# Patient Record
Sex: Female | Born: 1975 | Race: Black or African American | Hispanic: No | Marital: Single | Smoking: Former smoker
Health system: Southern US, Community
[De-identification: ages and names within clinical notes are randomized; demographics above are authoritative.]

## PROBLEM LIST (undated history)

## (undated) DIAGNOSIS — F419 Anxiety disorder, unspecified: Secondary | ICD-10-CM

## (undated) DIAGNOSIS — R0602 Shortness of breath: Secondary | ICD-10-CM

## (undated) DIAGNOSIS — R011 Cardiac murmur, unspecified: Secondary | ICD-10-CM

## (undated) DIAGNOSIS — J383 Other diseases of vocal cords: Secondary | ICD-10-CM

## (undated) DIAGNOSIS — I10 Essential (primary) hypertension: Secondary | ICD-10-CM

## (undated) DIAGNOSIS — F1120 Opioid dependence, uncomplicated: Secondary | ICD-10-CM

## (undated) DIAGNOSIS — J45909 Unspecified asthma, uncomplicated: Secondary | ICD-10-CM

## (undated) DIAGNOSIS — J40 Bronchitis, not specified as acute or chronic: Secondary | ICD-10-CM

## (undated) HISTORY — PX: TRACHEOSTOMY CLOSURE: SHX458

---

## 2012-11-13 HISTORY — PX: SPINAL FUSION: SHX223

## 2013-03-29 ENCOUNTER — Encounter (HOSPITAL_COMMUNITY): Payer: Self-pay | Admitting: *Deleted

## 2013-03-29 ENCOUNTER — Emergency Department (HOSPITAL_COMMUNITY)
Admission: EM | Admit: 2013-03-29 | Discharge: 2013-03-29 | Disposition: A | Discharge status: Home / Self Care | Payer: Medicaid Other | Attending: Emergency Medicine | Admitting: Emergency Medicine

## 2013-03-29 ENCOUNTER — Emergency Department (HOSPITAL_COMMUNITY): Payer: Medicaid Other

## 2013-03-29 DIAGNOSIS — S0993XA Unspecified injury of face, initial encounter: Secondary | ICD-10-CM | POA: Insufficient documentation

## 2013-03-29 DIAGNOSIS — J45909 Unspecified asthma, uncomplicated: Secondary | ICD-10-CM | POA: Insufficient documentation

## 2013-03-29 DIAGNOSIS — IMO0002 Reserved for concepts with insufficient information to code with codable children: Secondary | ICD-10-CM | POA: Insufficient documentation

## 2013-03-29 DIAGNOSIS — F17200 Nicotine dependence, unspecified, uncomplicated: Secondary | ICD-10-CM | POA: Insufficient documentation

## 2013-03-29 HISTORY — DX: Unspecified asthma, uncomplicated: J45.909

## 2013-03-29 MED ORDER — CYCLOBENZAPRINE HCL 10 MG PO TABS
10.0000 mg | ORAL_TABLET | Freq: Once | ORAL | Status: AC
  Administered 2013-03-29: 10 mg via ORAL
  Filled 2013-03-29: qty 1

## 2013-03-29 MED ORDER — HYDROCODONE-ACETAMINOPHEN 7.5-325 MG/15ML PO SOLN: 15.0000 mL | Freq: Three times a day (TID) | ORAL | Status: DC | PRN

## 2013-03-29 MED ORDER — HYDROCODONE-ACETAMINOPHEN 5-325 MG PO TABS
2.0000 | ORAL_TABLET | Freq: Once | ORAL | Status: AC
  Administered 2013-03-29: 2 via ORAL
  Filled 2013-03-29: qty 2

## 2013-03-29 MED ORDER — CYCLOBENZAPRINE HCL 10 MG PO TABS: 10.0000 mg | ORAL_TABLET | Freq: Two times a day (BID) | ORAL | Status: DC | PRN

## 2013-03-29 NOTE — ED Notes (Signed)
Patient transported to X-ray 
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
## 2013-03-29 NOTE — ED Notes (Signed)
She reports that she has loose teeth no mouth bleeding
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
## 2013-03-29 NOTE — ED Notes (Signed)
2 visitors escorted back to be with pt

## 2013-03-30 NOTE — ED Provider Notes (Signed)
Medical screening examination/treatment/procedure(s) were performed by non-physician practitioner and as supervising physician I was immediately available for consultation/collaboration.  Karilynn Carranza F Javid Kemler, MD 03/30/13 0807 

## 2013-04-07 ENCOUNTER — Emergency Department (HOSPITAL_COMMUNITY): Payer: No Typology Code available for payment source

## 2013-04-07 ENCOUNTER — Encounter (HOSPITAL_COMMUNITY): Payer: Self-pay | Admitting: Emergency Medicine

## 2013-04-07 ENCOUNTER — Emergency Department (HOSPITAL_COMMUNITY)
Admission: EM | Admit: 2013-04-07 | Discharge: 2013-04-07 | Disposition: A | Discharge status: Home / Self Care | Payer: No Typology Code available for payment source | Attending: Emergency Medicine | Admitting: Emergency Medicine

## 2013-04-07 DIAGNOSIS — S0993XA Unspecified injury of face, initial encounter: Secondary | ICD-10-CM | POA: Insufficient documentation

## 2013-04-07 DIAGNOSIS — J45909 Unspecified asthma, uncomplicated: Secondary | ICD-10-CM | POA: Insufficient documentation

## 2013-04-07 DIAGNOSIS — S8990XA Unspecified injury of unspecified lower leg, initial encounter: Secondary | ICD-10-CM | POA: Insufficient documentation

## 2013-04-07 DIAGNOSIS — M545 Low back pain: Secondary | ICD-10-CM

## 2013-04-07 DIAGNOSIS — Z79899 Other long term (current) drug therapy: Secondary | ICD-10-CM | POA: Insufficient documentation

## 2013-04-07 DIAGNOSIS — Y92410 Unspecified street and highway as the place of occurrence of the external cause: Secondary | ICD-10-CM | POA: Insufficient documentation

## 2013-04-07 DIAGNOSIS — M542 Cervicalgia: Secondary | ICD-10-CM

## 2013-04-07 DIAGNOSIS — Y9389 Activity, other specified: Secondary | ICD-10-CM | POA: Insufficient documentation

## 2013-04-07 DIAGNOSIS — F17200 Nicotine dependence, unspecified, uncomplicated: Secondary | ICD-10-CM | POA: Insufficient documentation

## 2013-04-07 DIAGNOSIS — IMO0002 Reserved for concepts with insufficient information to code with codable children: Secondary | ICD-10-CM | POA: Insufficient documentation

## 2013-04-07 LAB — BASIC METABOLIC PANEL
BUN: 8 mg/dL (ref 6–23)
Creatinine, Ser: 0.75 mg/dL (ref 0.50–1.10)
GFR calc Af Amer: >90 mL/min (ref >90)
GFR calc non Af Amer: >90 mL/min (ref >90)
Glucose, Bld: 102 mg/dL — ABNORMAL HIGH (ref 70–99)
Potassium: 3.3 mEq/L — ABNORMAL LOW (ref 3.5–5.1)

## 2013-04-07 LAB — CBC WITH DIFFERENTIAL/PLATELET
Basophils Relative: 1 % (ref 0–1)
Eosinophils Absolute: 0.2 10*3/uL (ref 0.0–0.7)
Eosinophils Relative: 3 % (ref 0–5)
HCT: 31.3 % — ABNORMAL LOW (ref 36.0–46.0)
Hemoglobin: 10.2 g/dL — ABNORMAL LOW (ref 12.0–15.0)
Lymphs Abs: 2.3 10*3/uL (ref 0.7–4.0)
MCH: 24.7 pg — ABNORMAL LOW (ref 26.0–34.0)
MCHC: 32.6 g/dL (ref 30.0–36.0)
MCV: 75.8 fL — ABNORMAL LOW (ref 78.0–100.0)
Monocytes Absolute: 0.3 10*3/uL (ref 0.1–1.0)
Monocytes Relative: 4 % (ref 3–12)
RBC: 4.13 MIL/uL (ref 3.87–5.11)

## 2013-04-07 MED ORDER — CYCLOBENZAPRINE HCL 10 MG PO TABS
5.0000 mg | ORAL_TABLET | Freq: Once | ORAL | Status: AC
  Administered 2013-04-07: 5 mg via ORAL
  Filled 2013-04-07: qty 1

## 2013-04-07 MED ORDER — ORPHENADRINE CITRATE 30 MG/ML IJ SOLN: 60.0000 mg | Freq: Two times a day (BID) | INTRAMUSCULAR | Status: DC

## 2013-04-07 MED ORDER — MORPHINE SULFATE 4 MG/ML IJ SOLN
4.0000 mg | Freq: Once | INTRAMUSCULAR | Status: AC
  Administered 2013-04-07: 4 mg via INTRAVENOUS
  Filled 2013-04-07: qty 1

## 2013-04-07 MED ORDER — HYDROCODONE-ACETAMINOPHEN 5-325 MG PO TABS: 1.0000 | ORAL_TABLET | Freq: Four times a day (QID) | ORAL | Status: DC | PRN

## 2013-04-07 MED ORDER — HYDROMORPHONE HCL PF 1 MG/ML IJ SOLN
1.0000 mg | Freq: Once | INTRAMUSCULAR | Status: AC
  Administered 2013-04-07: 1 mg via INTRAVENOUS
  Filled 2013-04-07: qty 1
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
## 2013-04-07 NOTE — ED Notes (Signed)
Patient returned from X-ray 
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
## 2013-04-07 NOTE — ED Notes (Signed)
Patient transported to X-ray 

## 2013-05-04 ENCOUNTER — Emergency Department (HOSPITAL_COMMUNITY)
Admission: EM | Admit: 2013-05-04 | Discharge: 2013-05-05 | Disposition: A | Discharge status: Home / Self Care | Payer: Medicaid Other | Attending: Emergency Medicine | Admitting: Emergency Medicine

## 2013-05-04 ENCOUNTER — Emergency Department (HOSPITAL_COMMUNITY): Payer: Medicaid Other

## 2013-05-04 ENCOUNTER — Encounter (HOSPITAL_COMMUNITY): Payer: Self-pay | Admitting: Emergency Medicine

## 2013-05-04 DIAGNOSIS — J4489 Other specified chronic obstructive pulmonary disease: Secondary | ICD-10-CM | POA: Insufficient documentation

## 2013-05-04 DIAGNOSIS — F3289 Other specified depressive episodes: Secondary | ICD-10-CM | POA: Insufficient documentation

## 2013-05-04 DIAGNOSIS — R05 Cough: Secondary | ICD-10-CM | POA: Insufficient documentation

## 2013-05-04 DIAGNOSIS — J449 Chronic obstructive pulmonary disease, unspecified: Secondary | ICD-10-CM | POA: Insufficient documentation

## 2013-05-04 DIAGNOSIS — J45909 Unspecified asthma, uncomplicated: Secondary | ICD-10-CM | POA: Insufficient documentation

## 2013-05-04 DIAGNOSIS — M549 Dorsalgia, unspecified: Secondary | ICD-10-CM | POA: Insufficient documentation

## 2013-05-04 DIAGNOSIS — R0602 Shortness of breath: Secondary | ICD-10-CM | POA: Insufficient documentation

## 2013-05-04 DIAGNOSIS — Z79899 Other long term (current) drug therapy: Secondary | ICD-10-CM | POA: Insufficient documentation

## 2013-05-04 DIAGNOSIS — R059 Cough, unspecified: Secondary | ICD-10-CM | POA: Insufficient documentation

## 2013-05-04 DIAGNOSIS — W19XXXA Unspecified fall, initial encounter: Secondary | ICD-10-CM

## 2013-05-04 DIAGNOSIS — Z3202 Encounter for pregnancy test, result negative: Secondary | ICD-10-CM | POA: Insufficient documentation

## 2013-05-04 DIAGNOSIS — F329 Major depressive disorder, single episode, unspecified: Secondary | ICD-10-CM | POA: Insufficient documentation

## 2013-05-04 DIAGNOSIS — F411 Generalized anxiety disorder: Secondary | ICD-10-CM | POA: Insufficient documentation

## 2013-05-04 DIAGNOSIS — R55 Syncope and collapse: Secondary | ICD-10-CM | POA: Insufficient documentation

## 2013-05-04 DIAGNOSIS — R079 Chest pain, unspecified: Secondary | ICD-10-CM | POA: Insufficient documentation

## 2013-05-04 HISTORY — DX: Anxiety disorder, unspecified: F41.9

## 2013-05-04 LAB — BASIC METABOLIC PANEL WITH GFR
BUN: 10 mg/dL (ref 6–23)
CO2: 26 meq/L (ref 19–32)
Calcium: 9.3 mg/dL (ref 8.4–10.5)
Chloride: 104 meq/L (ref 96–112)
Creatinine, Ser: 0.75 mg/dL (ref 0.50–1.10)
GFR calc Af Amer: >90 mL/min
GFR calc non Af Amer: >90 mL/min
Glucose, Bld: 83 mg/dL (ref 70–99)
Potassium: 3.7 meq/L (ref 3.5–5.1)
Sodium: 140 meq/L (ref 135–145)

## 2013-05-04 LAB — POCT I-STAT TROPONIN I: Troponin i, poc: 0 ng/mL (ref 0.00–0.08)

## 2013-05-04 LAB — CBC
Hemoglobin: 11.1 g/dL — ABNORMAL LOW (ref 12.0–15.0)
MCH: 25.2 pg — ABNORMAL LOW (ref 26.0–34.0)
MCHC: 33.2 g/dL (ref 30.0–36.0)
RDW: 16.7 % — ABNORMAL HIGH (ref 11.5–15.5)

## 2013-05-04 MED ORDER — HYDROMORPHONE HCL PF 1 MG/ML IJ SOLN
1.0000 mg | Freq: Once | INTRAMUSCULAR | Status: AC
  Administered 2013-05-04: 1 mg via INTRAVENOUS
  Filled 2013-05-04: qty 1

## 2013-05-04 MED ORDER — FENTANYL CITRATE 0.05 MG/ML IJ SOLN
50.0000 ug | Freq: Once | INTRAMUSCULAR | Status: AC
  Administered 2013-05-04: 50 ug via INTRAVENOUS
  Filled 2013-05-04: qty 2

## 2013-05-04 NOTE — ED Notes (Signed)
Patient requests more pain medication as her pain level is a 9 out of 10.

## 2013-05-04 NOTE — ED Notes (Signed)
MD at bedside. 
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
## 2013-05-04 NOTE — ED Provider Notes (Signed)
CSN: [id number]     Arrival date & time 05/04/13  2035 History   First MD Initiated Contact with Patient 05/04/13 2104     Chief Complaint  Patient presents with  . Chest Pain  . Back Pain  . Loss of Consciousness   (Consider location/radiation/quality/duration/timing/severity/associated sxs/prior Treatment) Patient is a 37 y.o. female presenting with chest pain, back pain, and syncope.  Chest Pain Associated symptoms: back pain and syncope   Back Pain Associated symptoms: chest pain   Loss of Consciousness Associated symptoms: chest pain    Pt with history of asthma reports she has had SOB, wheezing and chest tightness all day today with occasional cough but no fever. Just prior to arrival she went to the bathroom and apparently passed out. She does not remember what happened. Daughter called EMS but she is not available here. EMS reports there was apparently some shaking but no definite seizure. She was alert en route. Complaining now of severe thoracic and lower back pain. Recently moved to this area from [place] where she reportedly had lumbar spine fusion in April.   Past Medical History  Diagnosis Date  . Asthma   . COPD (chronic obstructive pulmonary disease)   . Anxiety    Past Surgical History  Procedure Laterality Date  . Spinal fusion     No family history on file. History  Substance Use Topics  . Smoking status: Current Every Day Smoker  . Smokeless tobacco: Not on file  . Alcohol Use: No   OB History   Grav Para Term Preterm Abortions TAB SAB Ect Mult Living                 Review of Systems  Cardiovascular: Positive for chest pain and syncope.  Musculoskeletal: Positive for back pain.   All other systems reviewed and are negative except as noted in HPI.   Allergies  Nsaids and Tramadol  Home Medications   Current Outpatient Rx  Name  Route  Sig  Dispense  Refill  . albuterol (PROVENTIL HFA;VENTOLIN HFA) 108 (90 BASE) MCG/ACT inhaler    Inhalation   Inhale 2 puffs into the lungs every 6 (six) hours as needed for wheezing.          BP 113/76  Pulse 85  Temp(Src) 98.6 F (37 C) (Oral)  Resp 18  Ht 5\' 2"  (1.575 m)  Wt 186 lb (84.369 kg)  BMI 34.01 kg/m2  SpO2 100% Physical Exam  Nursing note and vitals reviewed. Constitutional: She is oriented to person, place, and time. She appears well-developed and well-nourished.  HENT:  Head: Normocephalic and atraumatic.  Eyes: EOM are normal. Pupils are equal, round, and reactive to light.  Neck: Normal range of motion. Neck supple.  C-collar removed, NEXUS criteria met  Cardiovascular: Normal rate, normal heart sounds and intact distal pulses.   Pulmonary/Chest: Effort normal and breath sounds normal.  Abdominal: Bowel sounds are normal. She exhibits no distension. There is no tenderness.  Musculoskeletal: Normal range of motion. She exhibits tenderness (thoracic and low back pain). She exhibits no edema.  Neurological: She is alert and oriented to person, place, and time. She has normal strength. No cranial nerve deficit or sensory deficit.  Skin: Skin is warm and dry. No rash noted.  Psychiatric: She has a normal mood and affect.    ED Course  Procedures (including critical care time) Labs Review Labs Reviewed  CBC - Abnormal; Notable for the following:    WBC 11.2 (*)  Hemoglobin 11.1 (*)    HCT 33.4 (*)    MCV 75.7 (*)    MCH 25.2 (*)    RDW 16.7 (*)    All other components within normal limits  BASIC METABOLIC PANEL  URINALYSIS, ROUTINE W REFLEX MICROSCOPIC  PREGNANCY, URINE  POCT I-STAT TROPONIN I   Imaging Review No results found.  MDM  No diagnosis found.  ECG interpretation   Date: 05/04/2013  Rate: 82  Rhythm: normal sinus rhythm  QRS Axis: normal  Intervals: normal  ST/T Wave abnormalities: normal  Conduction Disutrbances: none  Narrative Interpretation: [facility]  Old EKG Reviewed: None available   Care signed out at the change of  shift pending imaging and labs.     Charles B. Bernette Mayers, MD 05/04/13 2248

## 2013-05-05 LAB — URINALYSIS, ROUTINE W REFLEX MICROSCOPIC
Glucose, UA: NEGATIVE mg/dL
Ketones, ur: 40 mg/dL — AB
Leukocytes, UA: NEGATIVE
Protein, ur: NEGATIVE mg/dL
Urobilinogen, UA: 0.2 mg/dL (ref 0.0–1.0)

## 2013-05-05 LAB — POCT I-STAT TROPONIN I

## 2013-05-05 MED ORDER — HYDROMORPHONE HCL PF 1 MG/ML IJ SOLN
1.0000 mg | Freq: Once | INTRAMUSCULAR | Status: AC
  Administered 2013-05-05: 1 mg via INTRAVENOUS
  Filled 2013-05-05: qty 1

## 2013-05-05 NOTE — ED Provider Notes (Signed)
Pt can ambulate, but does have continued low back pain She has no focal neuro deficits Repeat troponin negative I doubt PE at this time She reports her CP similar to prior asthma exacerbations Feel she is safe for d/c home   Joya Gaskins, MD 05/05/13 [phone]

## 2013-05-05 NOTE — ED Notes (Signed)
Signature pad not working in room 

## 2013-05-05 NOTE — ED Provider Notes (Signed)
Date: 05/05/2013 00:02  Rate: 75  Rhythm: normal sinus rhythm  QRS Axis: normal  Intervals: normal  ST/T Wave abnormalities: nonspecific ST changes  Conduction Disutrbances:none  Narrative Interpretation: [facility] noted  Old EKG Reviewed: unchanged from earlier EKG    Joya Gaskins, MD 05/05/13 [phone]

## 2013-05-05 NOTE — ED Notes (Signed)
Pt given [place] sandwich and sprite per MD approval.

## 2013-05-07 ENCOUNTER — Emergency Department (INDEPENDENT_AMBULATORY_CARE_PROVIDER_SITE_OTHER)
Admission: EM | Admit: 2013-05-07 | Discharge: 2013-05-07 | Disposition: A | Discharge status: Home / Self Care | Payer: Medicaid Other | Source: Home / Self Care | Attending: Family Medicine | Admitting: Family Medicine

## 2013-05-07 ENCOUNTER — Encounter (HOSPITAL_COMMUNITY): Payer: Self-pay | Admitting: Emergency Medicine

## 2013-05-07 DIAGNOSIS — M545 Low back pain: Secondary | ICD-10-CM

## 2013-05-07 NOTE — ED Notes (Signed)
Pt c/o lower back pain due to a recent fall from a panic attack Seen at [facility] ER recently for similar sxs Reports recent spinal infusion She is alert w/no signs of acute distress.

## 2013-05-07 NOTE — ED Provider Notes (Signed)
Julie Kerr is a 37 y.o. female who presents to Urgent Care today for low back pain and anxiety. Patient was recently moved from  out of town. 4 days prior she had a panic attack and fell over injuring her low back. She presented to the emergency room where x-rays were negative. She notes continued low back pain. She has radiation to the posterior thigh. She denies any radiating pain weakness numbness difficulty walking bowel bladder dysfunction. She's tried over-the-counter pain medications which were somewhat helpful. She is allergic to tramadol and NSAIDs.   Additionally she notes anxiety. This is been previously well controlled with Xanax by her primary care Dr. She has run out of her Xanax would like a refill today of possible. She notes that she has had prior suicide attempts on SSRIs. She does not feel suicidal or homicidal currently. She feels well otherwise  Past Medical History  Diagnosis Date  . Asthma   . COPD (chronic obstructive pulmonary disease)   . Anxiety    History  Substance Use Topics  . Smoking status: Current Every Day Smoker  . Smokeless tobacco: Not on file  . Alcohol Use: No   ROS as above Medications reviewed. No current facility-administered medications for this encounter.   Current Outpatient Prescriptions  Medication Sig Dispense Refill  . albuterol (PROVENTIL HFA;VENTOLIN HFA) 108 (90 BASE) MCG/ACT inhaler Inhale 2 puffs into the lungs every 6 (six) hours as needed for wheezing.        Exam:  BP 110/77  Pulse 80  Temp(Src) 98 F (36.7 C) (Oral)  Resp 16  SpO2 98%  LMP 04/27/2013 Gen: Well NAD HEENT: EOMI,  MMM Lungs: CTABL Nl WOB Heart: RRR no MRG Abd: NABS, NT, ND Exts: Non edematous BL  LE, warm and well perfused.  Back: Nontender to spinal midline tender palpation bilateral lumbar paraspinals  decreased flexion and extension neck range of motion due to pain. Normal rotation Reflexes are equal bilateral ankles and knees Strength is intact  lower extremity. Patient can get on and off exam table squat and has a normal gait.    No results found for this or any previous visit (from the past 24 hour(s)). No results found.  Assessment and Plan: 37 y.o. female with low back pain.  Unclear etiology likely mechanical muscular.  Plan only able to prescribe narcotic pain medications as patient has had multiple refills of multiple different providers over the past 2 months. Most recently she was prescribed 90 tablets of 15 mg oxycodone  on September 9. I discussed that I cannot prescribe medications and that she should take her left upper oxycodone if possible. She can followup with the on-call orthopedic doctor Dr. Luiz Blare or establish with a primary care Dr. in town..  For exam it again I cannot prescribe Xanax. I have referred her to the [facility] in [place] where she can get mental health. She is nonsuicidal however suicidal precautions were reviewed.  Discussed warning signs or symptoms. Please see discharge instructions. Patient expresses understanding.      Rodolph Bong, MD 05/07/13 [phone]

## 2013-06-14 ENCOUNTER — Inpatient Hospital Stay (HOSPITAL_COMMUNITY)
Admission: EM | Admit: 2013-06-14 | Discharge: 2013-06-18 | DRG: 208 | Disposition: A | Discharge status: Home Health | Payer: Medicaid Other | Attending: Internal Medicine | Admitting: Internal Medicine

## 2013-06-14 ENCOUNTER — Emergency Department (HOSPITAL_COMMUNITY): Payer: Medicaid Other

## 2013-06-14 ENCOUNTER — Encounter (HOSPITAL_COMMUNITY): Payer: Self-pay | Admitting: Pulmonary Disease

## 2013-06-14 DIAGNOSIS — J9600 Acute respiratory failure, unspecified whether with hypoxia or hypercapnia: Secondary | ICD-10-CM | POA: Diagnosis present

## 2013-06-14 DIAGNOSIS — G9340 Encephalopathy, unspecified: Secondary | ICD-10-CM | POA: Diagnosis present

## 2013-06-14 DIAGNOSIS — J9601 Acute respiratory failure with hypoxia: Secondary | ICD-10-CM | POA: Diagnosis present

## 2013-06-14 DIAGNOSIS — I10 Essential (primary) hypertension: Secondary | ICD-10-CM | POA: Diagnosis present

## 2013-06-14 DIAGNOSIS — F17200 Nicotine dependence, unspecified, uncomplicated: Secondary | ICD-10-CM | POA: Diagnosis present

## 2013-06-14 DIAGNOSIS — Z981 Arthrodesis status: Secondary | ICD-10-CM

## 2013-06-14 DIAGNOSIS — Z79899 Other long term (current) drug therapy: Secondary | ICD-10-CM

## 2013-06-14 DIAGNOSIS — S0003XA Contusion of scalp, initial encounter: Secondary | ICD-10-CM | POA: Diagnosis not present

## 2013-06-14 DIAGNOSIS — W010XXA Fall on same level from slipping, tripping and stumbling without subsequent striking against object, initial encounter: Secondary | ICD-10-CM | POA: Diagnosis not present

## 2013-06-14 DIAGNOSIS — F411 Generalized anxiety disorder: Secondary | ICD-10-CM | POA: Diagnosis present

## 2013-06-14 DIAGNOSIS — G894 Chronic pain syndrome: Secondary | ICD-10-CM | POA: Diagnosis present

## 2013-06-14 DIAGNOSIS — T380X5A Adverse effect of glucocorticoids and synthetic analogues, initial encounter: Secondary | ICD-10-CM | POA: Diagnosis not present

## 2013-06-14 DIAGNOSIS — Y9210 Unspecified residential institution as the place of occurrence of the external cause: Secondary | ICD-10-CM | POA: Diagnosis not present

## 2013-06-14 DIAGNOSIS — J441 Chronic obstructive pulmonary disease with (acute) exacerbation: Principal | ICD-10-CM | POA: Diagnosis present

## 2013-06-14 DIAGNOSIS — K5900 Constipation, unspecified: Secondary | ICD-10-CM | POA: Diagnosis present

## 2013-06-14 DIAGNOSIS — D509 Iron deficiency anemia, unspecified: Secondary | ICD-10-CM | POA: Diagnosis present

## 2013-06-14 DIAGNOSIS — D72829 Elevated white blood cell count, unspecified: Secondary | ICD-10-CM | POA: Diagnosis not present

## 2013-06-14 DIAGNOSIS — F419 Anxiety disorder, unspecified: Secondary | ICD-10-CM

## 2013-06-14 DIAGNOSIS — R061 Stridor: Secondary | ICD-10-CM

## 2013-06-14 LAB — POCT I-STAT 3, ART BLOOD GAS (G3+)
Acid-base deficit: 6 mmol/L — ABNORMAL HIGH (ref 0.0–2.0)
Bicarbonate: 19 mEq/L — ABNORMAL LOW (ref 20.0–24.0)
O2 Saturation: 100 %
TCO2: 20 mmol/L (ref 0–100)
pCO2 arterial: 34.3 mmHg — ABNORMAL LOW (ref 35.0–45.0)
pH, Arterial: 7.352 (ref 7.350–7.450)
pO2, Arterial: 217 mmHg — ABNORMAL HIGH (ref 80.0–100.0)

## 2013-06-14 LAB — PHOSPHORUS: Phosphorus: 2 mg/dL — ABNORMAL LOW (ref 2.3–4.6)

## 2013-06-14 LAB — URINALYSIS, ROUTINE W REFLEX MICROSCOPIC
Bilirubin Urine: NEGATIVE
Glucose, UA: NEGATIVE mg/dL
Hgb urine dipstick: NEGATIVE
Specific Gravity, Urine: 1.026 (ref 1.005–1.030)
pH: 5.5 (ref 5.0–8.0)

## 2013-06-14 LAB — URINE MICROSCOPIC-ADD ON

## 2013-06-14 LAB — MAGNESIUM: Magnesium: 1.8 mg/dL (ref 1.5–2.5)

## 2013-06-14 LAB — D-DIMER, QUANTITATIVE: D-Dimer, Quant: <0.27 ug/mL-FEU (ref 0.00–0.48)

## 2013-06-14 LAB — CBC WITH DIFFERENTIAL/PLATELET
Basophils Absolute: 0 10*3/uL (ref 0.0–0.1)
Eosinophils Relative: 1 % (ref 0–5)
HCT: 35.7 % — ABNORMAL LOW (ref 36.0–46.0)
Hemoglobin: 11.7 g/dL — ABNORMAL LOW (ref 12.0–15.0)
Lymphocytes Relative: 29 % (ref 12–46)
Lymphs Abs: 3 10*3/uL (ref 0.7–4.0)
MCV: 77.6 fL — ABNORMAL LOW (ref 78.0–100.0)
Monocytes Absolute: 0.5 10*3/uL (ref 0.1–1.0)
Monocytes Relative: 5 % (ref 3–12)
Neutro Abs: 6.8 10*3/uL (ref 1.7–7.7)
RDW: 16.7 % — ABNORMAL HIGH (ref 11.5–15.5)
WBC: 10.4 10*3/uL (ref 4.0–10.5)

## 2013-06-14 LAB — COMPREHENSIVE METABOLIC PANEL
Alkaline Phosphatase: 59 U/L (ref 39–117)
CO2: 21 mEq/L (ref 19–32)
Calcium: 9.4 mg/dL (ref 8.4–10.5)
Creatinine, Ser: 0.78 mg/dL (ref 0.50–1.10)
GFR calc Af Amer: >90 mL/min (ref >90)
GFR calc non Af Amer: >90 mL/min (ref >90)
Glucose, Bld: 104 mg/dL — ABNORMAL HIGH (ref 70–99)

## 2013-06-14 LAB — LACTIC ACID, PLASMA: Lactic Acid, Venous: 2.7 mmol/L — ABNORMAL HIGH (ref 0.5–2.2)

## 2013-06-14 LAB — PRO B NATRIURETIC PEPTIDE: Pro B Natriuretic peptide (BNP): 26.1 pg/mL (ref 0–125)

## 2013-06-14 LAB — CORTISOL: Cortisol, Plasma: 26.2 ug/dL

## 2013-06-14 LAB — CG4 I-STAT (LACTIC ACID): Lactic Acid, Venous: 2.13 mmol/L (ref 0.5–2.2)

## 2013-06-14 LAB — PROCALCITONIN: Procalcitonin: <0.1 ng/mL

## 2013-06-14 MED ORDER — MIDAZOLAM HCL 2 MG/2ML IJ SOLN: 1.0000 mg | INTRAMUSCULAR | Status: DC | PRN

## 2013-06-14 MED ORDER — METHYLPREDNISOLONE SODIUM SUCC 40 MG IJ SOLR
40.0000 mg | Freq: Two times a day (BID) | INTRAMUSCULAR | Status: DC
  Administered 2013-06-15 – 2013-06-16 (×3): 40 mg via INTRAVENOUS
  Filled 2013-06-14 (×5): qty 1

## 2013-06-14 MED ORDER — SODIUM CHLORIDE 0.9 % IV SOLN
10.0000 ug/h | INTRAVENOUS | Status: DC
  Administered 2013-06-14: 50 ug/h via INTRAVENOUS
  Filled 2013-06-14: qty 50

## 2013-06-14 MED ORDER — ROCURONIUM BROMIDE 50 MG/5ML IV SOLN
100.0000 mg | Freq: Once | INTRAVENOUS | Status: AC
  Administered 2013-06-14: 100 mg via INTRAVENOUS

## 2013-06-14 MED ORDER — KCL IN DEXTROSE-NACL 20-5-0.45 MEQ/L-%-% IV SOLN
INTRAVENOUS | Status: DC
  Administered 2013-06-14 – 2013-06-15 (×2): via INTRAVENOUS
  Filled 2013-06-14 (×6): qty 1000

## 2013-06-14 MED ORDER — LORAZEPAM 2 MG/ML IJ SOLN
INTRAMUSCULAR | Status: AC
  Filled 2013-06-14: qty 1

## 2013-06-14 MED ORDER — SODIUM CHLORIDE 0.9 % IV SOLN
2.0000 mg/h | INTRAVENOUS | Status: DC
  Filled 2013-06-14: qty 10

## 2013-06-14 MED ORDER — PROPOFOL 10 MG/ML IV EMUL
5.0000 ug/kg/min | INTRAVENOUS | Status: DC
  Administered 2013-06-14: 40 ug/kg/min via INTRAVENOUS
  Administered 2013-06-14: 50 ug/kg/min via INTRAVENOUS
  Administered 2013-06-15: 35 ug/kg/min via INTRAVENOUS
  Filled 2013-06-14 (×3): qty 100

## 2013-06-14 MED ORDER — SODIUM CHLORIDE 0.9 % IV SOLN: 250.0000 mL | INTRAVENOUS | Status: DC | PRN

## 2013-06-14 MED ORDER — ALBUTEROL SULFATE (5 MG/ML) 0.5% IN NEBU
5.0000 mg | INHALATION_SOLUTION | Freq: Once | RESPIRATORY_TRACT | Status: AC
  Administered 2013-06-14: 5 mg via RESPIRATORY_TRACT

## 2013-06-14 MED ORDER — EPINEPHRINE 0.3 MG/0.3ML IJ SOAJ
0.3000 mg | Freq: Once | INTRAMUSCULAR | Status: AC
  Administered 2013-06-14: 0.3 mg via INTRAMUSCULAR

## 2013-06-14 MED ORDER — INSULIN ASPART 100 UNIT/ML ~~LOC~~ SOLN
0.0000 [IU] | SUBCUTANEOUS | Status: DC
  Administered 2013-06-15 (×4): 2 [IU] via SUBCUTANEOUS

## 2013-06-14 MED ORDER — LEVOFLOXACIN IN D5W 750 MG/150ML IV SOLN
750.0000 mg | INTRAVENOUS | Status: DC
  Administered 2013-06-15 – 2013-06-17 (×3): 750 mg via INTRAVENOUS
  Filled 2013-06-14 (×4): qty 150

## 2013-06-14 MED ORDER — INSULIN ASPART 100 UNIT/ML ~~LOC~~ SOLN
2.0000 [IU] | SUBCUTANEOUS | Status: DC
  Administered 2013-06-14: 4 [IU] via SUBCUTANEOUS

## 2013-06-14 MED ORDER — SUCCINYLCHOLINE CHLORIDE 20 MG/ML IJ SOLN
100.0000 mg | Freq: Once | INTRAMUSCULAR | Status: AC
  Administered 2013-06-14: 100 mg via INTRAVENOUS
  Filled 2013-06-14: qty 5

## 2013-06-14 MED ORDER — METHYLPREDNISOLONE SODIUM SUCC 125 MG IJ SOLR
125.0000 mg | Freq: Once | INTRAMUSCULAR | Status: AC
  Administered 2013-06-14: 125 mg via INTRAVENOUS
  Filled 2013-06-14: qty 2

## 2013-06-14 MED ORDER — PROPOFOL 10 MG/ML IV BOLUS
0.5000 mg/kg | Freq: Once | INTRAVENOUS | Status: AC
  Administered 2013-06-14: 10 mg via INTRAVENOUS

## 2013-06-14 MED ORDER — FENTANYL BOLUS VIA INFUSION
25.0000 ug | Freq: Four times a day (QID) | INTRAVENOUS | Status: DC | PRN
  Filled 2013-06-14: qty 100

## 2013-06-14 MED ORDER — ETOMIDATE 2 MG/ML IV SOLN
20.0000 mg | Freq: Once | INTRAVENOUS | Status: AC
  Administered 2013-06-14: 20 mg via INTRAVENOUS

## 2013-06-14 MED ORDER — EPINEPHRINE 0.3 MG/0.3ML IJ SOAJ
INTRAMUSCULAR | Status: AC
  Filled 2013-06-14: qty 0.3

## 2013-06-14 MED ORDER — MIDAZOLAM HCL 2 MG/2ML IJ SOLN
INTRAMUSCULAR | Status: AC
  Administered 2013-06-14: 4 mg
  Filled 2013-06-14: qty 6

## 2013-06-14 MED ORDER — LORAZEPAM 2 MG/ML IJ SOLN
1.0000 mg | Freq: Once | INTRAMUSCULAR | Status: AC
  Administered 2013-06-14: 1 mg via INTRAVENOUS

## 2013-06-14 MED ORDER — RACEPINEPHRINE HCL 2.25 % IN NEBU: 0.5000 mL | INHALATION_SOLUTION | Freq: Once | RESPIRATORY_TRACT | Status: DC

## 2013-06-14 MED ORDER — BIOTENE DRY MOUTH MT LIQD
15.0000 mL | Freq: Four times a day (QID) | OROMUCOSAL | Status: DC
  Administered 2013-06-15 – 2013-06-18 (×10): 15 mL via OROMUCOSAL

## 2013-06-14 MED ORDER — MAGNESIUM SULFATE 40 MG/ML IJ SOLN
2.0000 g | Freq: Once | INTRAMUSCULAR | Status: AC
  Administered 2013-06-14: 2 g via INTRAVENOUS
  Filled 2013-06-14: qty 50

## 2013-06-14 MED ORDER — CHLORHEXIDINE GLUCONATE 0.12 % MT SOLN
15.0000 mL | Freq: Two times a day (BID) | OROMUCOSAL | Status: DC
  Administered 2013-06-14 – 2013-06-15 (×3): 15 mL via OROMUCOSAL
  Filled 2013-06-14 (×3): qty 15

## 2013-06-14 MED ORDER — HEPARIN SODIUM (PORCINE) 5000 UNIT/ML IJ SOLN
5000.0000 [IU] | Freq: Three times a day (TID) | INTRAMUSCULAR | Status: DC
  Administered 2013-06-14 – 2013-06-18 (×12): 5000 [IU] via SUBCUTANEOUS
  Filled 2013-06-14 (×14): qty 1

## 2013-06-14 MED ORDER — PROPOFOL 10 MG/ML IV EMUL
5.0000 ug/kg/min | Freq: Once | INTRAVENOUS | Status: DC
  Administered 2013-06-14: 10 ug/kg/min via INTRAVENOUS

## 2013-06-14 MED ORDER — PROPOFOL 10 MG/ML IV EMUL
5.0000 ug/kg/min | Freq: Once | INTRAVENOUS | Status: DC
  Filled 2013-06-14: qty 100

## 2013-06-14 MED ORDER — ALBUTEROL SULFATE (5 MG/ML) 0.5% IN NEBU
2.5000 mg | INHALATION_SOLUTION | RESPIRATORY_TRACT | Status: DC
  Administered 2013-06-14: 5 mg via RESPIRATORY_TRACT
  Administered 2013-06-14 – 2013-06-15 (×4): 2.5 mg via RESPIRATORY_TRACT
  Filled 2013-06-14 (×5): qty 0.5

## 2013-06-14 MED ORDER — SODIUM CHLORIDE 0.9 % IV SOLN
25.0000 ug/h | INTRAVENOUS | Status: DC
  Administered 2013-06-15: 300 ug/h via INTRAVENOUS
  Administered 2013-06-15: 100 ug/h via INTRAVENOUS
  Filled 2013-06-14 (×2): qty 50

## 2013-06-14 MED ORDER — IPRATROPIUM BROMIDE 0.02 % IN SOLN: 0.5000 mg | Freq: Once | RESPIRATORY_TRACT | Status: DC

## 2013-06-14 MED ORDER — PANTOPRAZOLE SODIUM 40 MG IV SOLR
40.0000 mg | Freq: Two times a day (BID) | INTRAVENOUS | Status: DC
  Administered 2013-06-14 – 2013-06-15 (×3): 40 mg via INTRAVENOUS
  Filled 2013-06-14 (×5): qty 40

## 2013-06-14 MED ORDER — SODIUM CHLORIDE 0.9 % IV SOLN: INTRAVENOUS | Status: DC

## 2013-06-14 NOTE — Progress Notes (Signed)
Unit CM UR Completed by [facility] ED CM  W. Airyonna Franklyn RN  

## 2013-06-14 NOTE — ED Notes (Signed)
Results of lactic acid shown to Dr. Rancour 

## 2013-06-14 NOTE — ED Notes (Signed)
Placed temp foley into patient urine yellow in return

## 2013-06-14 NOTE — ED Provider Notes (Signed)
CSN: [id number]     Arrival date & time 06/14/13  1356 History   First MD Initiated Contact with Patient 06/14/13 1359     Chief Complaint  Patient presents with  . Shortness of Breath   (Consider location/radiation/quality/duration/timing/severity/associated sxs/prior Treatment) HPI Comments: Level V caveat for respiratory distress. Patient from home with increased work of breathing, coughing and wheezing in the past 2 days. Reports history of COPD. Inhalers at home without relief. Patient has loud stridor with tachypnea on arrival with scattered expiratory wheezing and poor air exchange. She has persistent coughing, is anxious and tearful. Reports being admitted to hospital years ago for COPD exacerbation with intubation.  The history is provided by the patient. The history is limited by the condition of the patient.    Past Medical History  Diagnosis Date  . Asthma   . COPD (chronic obstructive pulmonary disease)   . Anxiety    Past Surgical History  Procedure Laterality Date  . Spinal fusion     No family history on file. History  Substance Use Topics  . Smoking status: Current Every Day Smoker  . Smokeless tobacco: Not on file  . Alcohol Use: No   OB History   Grav Para Term Preterm Abortions TAB SAB Ect Mult Living                 Review of Systems  Unable to perform ROS: Severe respiratory distress    Allergies  Nsaids and Tramadol  Home Medications   Current Outpatient Rx  Name  Route  Sig  Dispense  Refill  . albuterol (PROVENTIL HFA;VENTOLIN HFA) 108 (90 BASE) MCG/ACT inhaler   Inhalation   Inhale 2 puffs into the lungs every 6 (six) hours as needed for wheezing.          BP 133/70  Pulse 112  Temp(Src) 98.6 F (37 C) (Core (Comment))  Resp 14  Ht 5\' 7"  (1.702 m)  Wt 200 lb (90.719 kg)  BMI 31.32 kg/m2  SpO2 100% Physical Exam  Constitutional: She is oriented to person, place, and time. She appears well-developed and well-nourished. She  appears distressed.  tachypneic, distress, diaphoretic, stridor  HENT:  Head: Normocephalic and atraumatic.  Mouth/Throat: Oropharynx is clear and moist. No oropharyngeal exudate.  Oropharynx clear with no asymmetry. Uvula midline  Eyes: Conjunctivae and EOM are normal. Pupils are equal, round, and reactive to light.  Neck: Normal range of motion. Neck supple.  Cardiovascular: Normal rate, regular rhythm and normal heart sounds.   No murmur heard. Tachycardic  Pulmonary/Chest: She is in respiratory distress. She has wheezes.  Coarse breath sounds with poor air exchange throughout. Loud stridor. Scattered expiratory wheezing  Abdominal: Soft. There is no tenderness. There is no rebound and no guarding.  Musculoskeletal: Normal range of motion. She exhibits no edema and no tenderness.  Neurological: She is alert and oriented to person, place, and time. No cranial nerve deficit. She exhibits normal muscle tone. Coordination normal.  Skin: Skin is warm. She is diaphoretic.    ED Course  INTUBATION Performed by: Glynn Octave Authorized by: Glynn Octave Consent: Verbal consent obtained. The procedure was performed in an emergent situation. Risks and benefits: risks, benefits and alternatives were discussed Consent given by: patient Patient understanding: patient states understanding of the procedure being performed Patient consent: the patient's understanding of the procedure matches consent given Patient identity confirmed: verbally with patient and arm band Time out: Immediately prior to procedure a "time out" was called  to verify the correct patient, procedure, equipment, support staff and site/side marked as required. Indications: respiratory distress and airway protection Intubation method: video-assisted Patient status: paralyzed (RSI) Preoxygenation: nonrebreather mask and BVM Sedatives: etomidate Paralytic: succinylcholine Laryngoscope size: Mac 4 Tube size: 7.5  mm Tube type: cuffed Number of attempts: 1 Ventilation between attempts: BVM Cricoid pressure: no Cords visualized: yes Post-procedure assessment: chest rise and ETCO2 monitor Breath sounds: equal Cuff inflated: yes ETT to lip: 23 cm Tube secured with: ETT holder Chest x-ray interpreted by me. Chest x-ray findings: endotracheal tube too low Tube repositioned: tube repositioned successfully Patient tolerance: Patient tolerated the procedure well with no immediate complications.   (including critical care time) Labs Review Labs Reviewed  CBC WITH DIFFERENTIAL - Abnormal; Notable for the following:    Hemoglobin 11.7 (*)    HCT 35.7 (*)    MCV 77.6 (*)    MCH 25.4 (*)    RDW 16.7 (*)    All other components within normal limits  COMPREHENSIVE METABOLIC PANEL - Abnormal; Notable for the following:    Glucose, Bld 104 (*)    Total Bilirubin 0.2 (*)    All other components within normal limits  URINALYSIS, ROUTINE W REFLEX MICROSCOPIC - Abnormal; Notable for the following:    Protein, ur 30 (*)    All other components within normal limits  POCT I-STAT 3, BLOOD GAS (G3+) - Abnormal; Notable for the following:    pCO2 arterial 34.3 (*)    pO2, Arterial 217.0 (*)    Bicarbonate 19.0 (*)    Acid-base deficit 6.0 (*)    All other components within normal limits  CULTURE, RESPIRATORY (NON-EXPECTORATED)  CULTURE, BLOOD (ROUTINE X 2)  CULTURE, BLOOD (ROUTINE X 2)  URINE CULTURE  TROPONIN I  D-DIMER, QUANTITATIVE  URINE MICROSCOPIC-ADD ON  MAGNESIUM  PHOSPHORUS  TROPONIN I  TROPONIN I  TROPONIN I  LACTIC ACID, PLASMA  PROCALCITONIN  PRO B NATRIURETIC PEPTIDE  CORTISOL  D-DIMER, QUANTITATIVE  STREP PNEUMONIAE URINARY ANTIGEN  LEGIONELLA ANTIGEN, URINE  EPSTEIN-BARR VIRUS VCA, IGG  EPSTEIN-BARR VIRUS VCA, IGM  RSV(RESPIRATORY SYNCYTIAL VIRUS) AB, BLOOD  CMV IGM  FERRITIN  CG4 I-STAT (LACTIC ACID)   Imaging Review Dg Chest Portable 1 View  06/14/2013   CLINICAL DATA:   Status post intubation. Shortness of breath.  EXAM: PORTABLE CHEST - 1 VIEW  COMPARISON:  One-view chest 06/14/2013 at 2:12 p.m.  FINDINGS: The patient is now intubated. Endotracheal tube terminates 3.5 cm above the chronic, in satisfactory position. Low lung volumes exaggerate the heart size. No focal airspace disease is evident.  IMPRESSION: 1. Satisfactory positioning of the endotracheal tube. 2. No acute cardiopulmonary disease.   Electronically Signed   By: Gennette Pac M.D.   On: 06/14/2013 15:14   Dg Chest Portable 1 View  06/14/2013   CLINICAL DATA:  Cough, congestion, respiratory distress  EXAM: PORTABLE CHEST - 1 VIEW  COMPARISON:  05/04/2013  FINDINGS: Cardiomediastinal silhouette is stable. No acute infiltrate or pleural effusion. No pulmonary edema. Mild left basilar atelectasis.  IMPRESSION: No acute infiltrate or pulmonary edema. Mild left basilar atelectasis.   Electronically Signed   By: Natasha Mead M.D.   On: 06/14/2013 14:21    EKG Interpretation     Ventricular Rate:  127 PR Interval:  130 QRS Duration: 76 QT Interval:  329 QTC Calculation: 478 R Axis:   60 Text Interpretation:  Sinus tachycardia Consider left ventricular hypertrophy Borderline prolonged QT interval Baseline wander in lead(s)  II aVR aVF V1 V3 V4 V5 Rate faster Artifact            MDM   1. Respiratory failure, acute   2. Stridor    Patient presents from home with respiratory distress and stridor. Reports history of COPD and intubation several years ago. Denies sick contacts at home. No hypoxia.   Patient breathing 40-50 times a minute, poor air exchange with no hypoxia. Loud stridor. Patient given albuterol, Atrovent, Solu-Medrol, magnesium, IM epinephrine and racemic epinephrine.  Above measures did not seem to improve patient's tachypnea. She was given IV Ativan as well. Given her degree of distress, patient is not able to tolerate BiPAP and was intubated.  Patient required additional  sedation after intubation as she tried to self extubate and became distressed. Recheck of the endotracheal tube shows is still in place. Sinus tachycardia on EKG.  D-dimer negative.  PE seems less likely.  We'll treat for asthma/COPD exacerbation. Suspect some element of vocal cord dysfunction as well. Discussed with Dr. Vassie Loll.  CRITICAL CARE Performed by: Glynn Octave Total critical care time: 30 Critical care time was exclusive of separately billable procedures and treating other patients. Critical care was necessary to treat or prevent imminent or life-threatening deterioration. Critical care was time spent personally by me on the following activities: development of treatment plan with patient and/or surrogate as well as nursing, discussions with consultants, evaluation of patient's response to treatment, examination of patient, obtaining history from patient or surrogate, ordering and performing treatments and interventions, ordering and review of laboratory studies, ordering and review of radiographic studies, pulse oximetry and re-evaluation of patient's condition.   Glynn Octave, MD 06/14/13 [phone]

## 2013-06-14 NOTE — H&P (Addendum)
PULMONARY  / CRITICAL CARE MEDICINE  Name: Julie Kerr MRN: [id number] DOB: [birth year]    ADMISSION DATE:  06/14/2013 CONSULTATION DATE:  10/31  REFERRING MD :  Rancour PRIMARY SERVICE: PCCM   CHIEF COMPLAINT:  Acute respiratory failure   BRIEF PATIENT DESCRIPTION 37 year old female who presented to [facility] ED 10/31 w/ several day h/o progressive SOB and loud audible wheeze (no other hx at time of admit). Upon presentation became progressively anxious with marked increase in WOB. Intubated for respiratory distress. PCCM asked to admit.   >per ED staff she has been intubated before at another hospital   SIGNIFICANT EVENTS / STUDIES:    LINES / TUBES: OETT 10/31>>>  CULTURES: Sputum 10/31>>> UC 10/31>>> bcx2 10/31>>> Viral panel 10/31>>> ustrep 10/31>>> u legionella 10/31>>  ANTIBIOTICS: levaquin 10/31>>>  HISTORY OF PRESENT ILLNESS:   37 year old female who presented to [facility] ED 10/31 w/ several day h/o progressive SOB and loud audible wheeze (no other hx at time of admit). Upon presentation became progressively anxious with marked increase in WOB. Intubated for respiratory distress. PCCM asked to admit.  PAST MEDICAL HISTORY :  Past Medical History  Diagnosis Date  . Asthma   . Anxiety    Past Surgical History  Procedure Laterality Date  . Spinal fusion     Prior to Admission medications   Medication Sig Start Date End Date Taking? Authorizing Provider  albuterol (PROVENTIL HFA;VENTOLIN HFA) 108 (90 BASE) MCG/ACT inhaler Inhale 2 puffs into the lungs every 6 (six) hours as needed for wheezing.    Historical Provider, MD   Allergies  Allergen Reactions  . Nsaids Hives  . Tramadol Hives    FAMILY HISTORY:  No family history on file. SOCIAL HISTORY:  reports that she has been smoking.  She does not have any smokeless tobacco history on file. She reports that she does not drink alcohol. Her drug history is not on file.  REVIEW OF SYSTEMS:  Unable   SUBJECTIVE:   Sedated on vent  VITAL SIGNS: Temp:  [98.6 F (37 C)] 98.6 F (37 C) (10/31 1522) Pulse Rate:  [89-150] 89 (10/31 2030) Resp:  [14-48] 16 (10/31 2030) BP: (97-147)/(51-81) 97/52 mmHg (10/31 2030) SpO2:  [99 %-100 %] 100 % (10/31 2030) FiO2 (%):  [40 %-60 %] 40 % (10/31 1938) Weight:  [84.8 kg (186 lb 15.2 oz)-90.719 kg (200 lb)] 84.8 kg (186 lb 15.2 oz) (10/31 2000) HEMODYNAMICS:   VENTILATOR SETTINGS: Vent Mode:  [-] PRVC FiO2 (%):  [40 %-60 %] 40 % Set Rate:  [16 bmp-24 bmp] 16 bmp Vt Set:  [500 mL] 500 mL PEEP:  [5 cmH20] 5 cmH20 Plateau Pressure:  [15 cmH20] 15 cmH20 INTAKE / OUTPUT: Intake/Output     10/31 0701 - 11/01 0700   I.V. (mL/kg) 166.6 (2)   Total Intake(mL/kg) 166.6 (2)   Urine (mL/kg/hr) 175   Total Output 175   Net -8.4         PHYSICAL EXAMINATION: General:  Obese aaf, currently sedated on vent  Neuro:  Sedated, moves all ext and shakes head violently when sedation decreased  HEENT:  Orally intubated. No JVD  Cardiovascular:  rrr Lungs:  Exp wheeze, scattered rhonchi  Abdomen:  Obese, no OM, + bowel sounds  Musculoskeletal:  Intact  Skin:  Intact   LABS:  CBC Recent Labs     06/14/13  1400  WBC  10.4  HGB  11.7*  HCT  35.7*  PLT  341  Coag's No results found for this basename: APTT, INR,  in the last 72 hours BMET Recent Labs     06/14/13  1400  NA  138  K  3.9  CL  104  CO2  21  BUN  9  CREATININE  0.78  GLUCOSE  104*   Electrolytes Recent Labs     06/14/13  1400  06/14/13  1610  CALCIUM  9.4   --   MG   --   1.8  PHOS   --   2.0*   Sepsis Markers Recent Labs     06/14/13  1610  PROCALCITON  <0.10   ABG Recent Labs     06/14/13  1516  PHART  7.352  PCO2ART  34.3*  PO2ART  217.0*   Liver Enzymes Recent Labs     06/14/13  1400  AST  13  ALT  12  ALKPHOS  59  BILITOT  0.2*  ALBUMIN  3.8   Cardiac Enzymes Recent Labs     06/14/13  1400  06/14/13  1610  TROPONINI  <0.30  <0.30  PROBNP   --   26.1    Glucose Recent Labs     06/14/13  1806  06/14/13  2020  GLUCAP  133*  155*    Imaging Dg Chest Portable 1 View  06/14/2013   CLINICAL DATA:  Status post intubation. Shortness of breath.  EXAM: PORTABLE CHEST - 1 VIEW  COMPARISON:  One-view chest 06/14/2013 at 2:12 p.m.  FINDINGS: The patient is now intubated. Endotracheal tube terminates 3.5 cm above the chronic, in satisfactory position. Low lung volumes exaggerate the heart size. No focal airspace disease is evident.  IMPRESSION: 1. Satisfactory positioning of the endotracheal tube. 2. No acute cardiopulmonary disease.   Electronically Signed   By: Gennette Pac M.D.   On: 06/14/2013 15:14   Dg Chest Portable 1 View  06/14/2013   CLINICAL DATA:  Cough, congestion, respiratory distress  EXAM: PORTABLE CHEST - 1 VIEW  COMPARISON:  05/04/2013  FINDINGS: Cardiomediastinal silhouette is stable. No acute infiltrate or pleural effusion. No pulmonary edema. Mild left basilar atelectasis.  IMPRESSION: No acute infiltrate or pulmonary edema. Mild left basilar atelectasis.   Electronically Signed   By: Natasha Mead M.D.   On: 06/14/2013 14:21     CXR: ETT good position, CXR w/out infiltrates   ASSESSMENT / PLAN:  PULMONARY A: Acute respiratory failure in setting of acute asthmatic exacerbation Suspect element of VCD  P:   Full vent support Scheduled BDs Systemic steroids Sedation protocol See ID section PPI q12   CARDIOVASCULAR A:  Tachycardia -in setting of agitation  P:  IV hydration Tele monitoring Sedation    RENAL A:   No acute issue  P:   Keep euvolemic Trend chemistry   GASTROINTESTINAL A:   No acute issue: although suspect GERD a contributing factor P:   ppi OGT Tube feeds to start 11/1 if still intubated   HEMATOLOGIC A:   Anemia  Microcytic/hypochromic likely feso4 def  P:  Ck ferritin  Ck FOB  Trend cbc   INFECTIOUS A:   Acute bronchitis  P:   Empiric levaquin  See dashboard    ENDOCRINE A:   No acute issue  P:   Add ssi insulin   NEUROLOGIC A:   Acute encephalopathy  P:   Supportive care  TODAY'S SUMMARY:  Will admit to ICU, treat as asthmatic exacerbation. But think VCD big player. Suspect extubation  soon.   I have personally obtained a history, examined the patient, evaluated laboratory and imaging results, formulated the assessment and plan and placed orders. CRITICAL CARE: The patient is critically ill with multiple organ systems failure and requires high complexity decision making for assessment and support, frequent evaluation and titration of therapies, application of advanced monitoring technologies and extensive interpretation of multiple databases. Critical Care Time devoted to patient care services described in this note is --- minutes.   Billy Fischer, MD ; [facility] [phone].  After 5:30 PM or weekends, call [phone]  Pulmonary and Critical Care Medicine [name] Pager: [phone]  06/14/2013, 9:32 PM

## 2013-06-14 NOTE — ED Notes (Signed)
Report attempted x2

## 2013-06-14 NOTE — ED Notes (Signed)
Pt in from home c/o shortness of breath x2-3 days

## 2013-06-15 ENCOUNTER — Encounter (HOSPITAL_COMMUNITY): Payer: Self-pay | Admitting: *Deleted

## 2013-06-15 LAB — GLUCOSE, CAPILLARY
Glucose-Capillary: 134 mg/dL — ABNORMAL HIGH (ref 70–99)
Glucose-Capillary: 143 mg/dL — ABNORMAL HIGH (ref 70–99)
Glucose-Capillary: 112 mg/dL — ABNORMAL HIGH (ref 70–99)

## 2013-06-15 LAB — LEGIONELLA ANTIGEN, URINE: Legionella Antigen, Urine: NEGATIVE

## 2013-06-15 LAB — COMPREHENSIVE METABOLIC PANEL
ALT: 11 U/L (ref 0–35)
AST: 13 U/L (ref 0–37)
Albumin: 3.3 g/dL — ABNORMAL LOW (ref 3.5–5.2)
CO2: 19 mEq/L (ref 19–32)
Calcium: 8.7 mg/dL (ref 8.4–10.5)
Chloride: 104 mEq/L (ref 96–112)
Creatinine, Ser: 0.69 mg/dL (ref 0.50–1.10)
GFR calc Af Amer: >90 mL/min (ref >90)
GFR calc non Af Amer: >90 mL/min (ref >90)
Sodium: 134 mEq/L — ABNORMAL LOW (ref 135–145)
Total Bilirubin: 0.2 mg/dL — ABNORMAL LOW (ref 0.3–1.2)

## 2013-06-15 LAB — CBC
HCT: 30.6 % — ABNORMAL LOW (ref 36.0–46.0)
MCV: 77.1 fL — ABNORMAL LOW (ref 78.0–100.0)
Platelets: 313 10*3/uL (ref 150–400)
RBC: 3.97 MIL/uL (ref 3.87–5.11)
RDW: 16.7 % — ABNORMAL HIGH (ref 11.5–15.5)
WBC: 21.6 10*3/uL — ABNORMAL HIGH (ref 4.0–10.5)

## 2013-06-15 LAB — TROPONIN I: Troponin I: <0.3 ng/mL (ref <0.30)

## 2013-06-15 LAB — STREP PNEUMONIAE URINARY ANTIGEN: Strep Pneumo Urinary Antigen: NEGATIVE

## 2013-06-15 MED ORDER — ONDANSETRON HCL 4 MG/2ML IJ SOLN
4.0000 mg | Freq: Four times a day (QID) | INTRAMUSCULAR | Status: DC | PRN
  Administered 2013-06-15: 4 mg via INTRAVENOUS
  Filled 2013-06-15: qty 2

## 2013-06-15 MED ORDER — BUDESONIDE 0.25 MG/2ML IN SUSP
0.2500 mg | Freq: Two times a day (BID) | RESPIRATORY_TRACT | Status: DC
  Administered 2013-06-15: 0.25 mg via RESPIRATORY_TRACT
  Filled 2013-06-15 (×5): qty 2

## 2013-06-15 MED ORDER — LORAZEPAM 2 MG/ML IJ SOLN
0.5000 mg | INTRAMUSCULAR | Status: DC | PRN
  Administered 2013-06-15 – 2013-06-17 (×4): 1 mg via INTRAVENOUS
  Filled 2013-06-15 (×4): qty 1

## 2013-06-15 MED ORDER — PROPOFOL 10 MG/ML IV EMUL
5.0000 ug/kg/min | INTRAVENOUS | Status: DC
Start: 2013-06-15 — End: 2013-06-15
  Administered 2013-06-15: 35 ug/kg/min via INTRAVENOUS

## 2013-06-15 MED ORDER — ALBUTEROL SULFATE (5 MG/ML) 0.5% IN NEBU
2.5000 mg | INHALATION_SOLUTION | RESPIRATORY_TRACT | Status: DC | PRN
  Filled 2013-06-15: qty 0.5

## 2013-06-15 MED ORDER — INSULIN ASPART 100 UNIT/ML ~~LOC~~ SOLN: 0.0000 [IU] | Freq: Every day | SUBCUTANEOUS | Status: DC

## 2013-06-15 MED ORDER — ALBUTEROL SULFATE (5 MG/ML) 0.5% IN NEBU
2.5000 mg | INHALATION_SOLUTION | Freq: Four times a day (QID) | RESPIRATORY_TRACT | Status: DC
  Administered 2013-06-15 – 2013-06-16 (×4): 2.5 mg via RESPIRATORY_TRACT
  Filled 2013-06-15 (×4): qty 0.5

## 2013-06-15 MED ORDER — INSULIN ASPART 100 UNIT/ML ~~LOC~~ SOLN: 0.0000 [IU] | Freq: Three times a day (TID) | SUBCUTANEOUS | Status: DC

## 2013-06-15 MED ORDER — FENTANYL CITRATE 0.05 MG/ML IJ SOLN
12.5000 ug | INTRAMUSCULAR | Status: DC | PRN
  Administered 2013-06-15 – 2013-06-16 (×4): 25 ug via INTRAVENOUS
  Filled 2013-06-15 (×4): qty 2

## 2013-06-15 NOTE — Procedures (Signed)
Extubation Procedure Note  Patient Details:   Name: Nasteho Glantz DOB: 26-Apr-1976 MRN: [id number]   Airway Documentation:     Evaluation  O2 sats: stable throughout and currently acceptable Complications: No apparent complications Patient did tolerate procedure well. Bilateral Breath Sounds: Clear Suctioning: Oral Yes  Prior to intubation: Pt suctioned orally, subglottically, and via ETT.  Positive cuff leak noted.  Post-extubation: Pt able to state her name, cough to produce sputum, and no stridor noted.  Antoine Poche 06/15/2013, 12:39 PM

## 2013-06-15 NOTE — Progress Notes (Signed)
4 point restraints off . Mittens placed for safety after Pt wrote feelings on white board . Call bell placed in lap and instructions provided the patient wrote she will not pull at ETT if she is given med to not hurt and rest well .verbal agreement made w/Pt.

## 2013-06-15 NOTE — H&P (Signed)
PULMONARY  / CRITICAL CARE MEDICINE  Name: Julie Kerr MRN: [id number] DOB: 1976/05/23    ADMISSION DATE:  06/14/2013 CONSULTATION DATE:  10/31  REFERRING MD :  Rancour PRIMARY SERVICE: PCCM   CHIEF COMPLAINT:  Acute respiratory failure   BRIEF PATIENT DESCRIPTION 37 year old female who presented to [facility] ED 10/31 w/ several day h/o progressive SOB and loud audible wheeze (no other hx at time of admit). Upon presentation became progressively anxious with marked increase in WOB. Intubated for respiratory distress. PCCM asked to admit.   >per ED staff she has been intubated before at another hospital   SIGNIFICANT EVENTS / STUDIES:    LINES / TUBES: OETT 10/31>>>11/1  CULTURES: Sputum 10/31>>> UC 10/31>>> bcx2 10/31>>> Viral panel 10/31>>> ustrep 10/31>>> u legionella 10/31>>  ANTIBIOTICS: levaquin 10/31>>>   SUBJECTIVE:  Awake, alert, conversant  VITAL SIGNS: Temp:  [97.9 F (36.6 C)-99 F (37.2 C)] 99 F (37.2 C) (11/01 0400) Pulse Rate:  [62-150] 68 (11/01 0848) Resp:  [14-48] 16 (11/01 0848) BP: (97-147)/(48-81) 112/59 mmHg (11/01 0848) SpO2:  [94 %-100 %] 100 % (11/01 0855) FiO2 (%):  [40 %-60 %] 40 % (11/01 0855) Weight:  [83.4 kg (183 lb 13.8 oz)-90.719 kg (200 lb)] 84.2 kg (185 lb 10 oz) (11/01 0400) HEMODYNAMICS:   VENTILATOR SETTINGS: Vent Mode:  [-] PSV;CPAP FiO2 (%):  [40 %-60 %] 40 % Set Rate:  [16 bmp-24 bmp] 16 bmp Vt Set:  [500 mL] 500 mL PEEP:  [5 cmH20] 5 cmH20 Pressure Support:  [5 cmH20] 5 cmH20 Plateau Pressure:  [14 cmH20-16 cmH20] 16 cmH20 INTAKE / OUTPUT: Intake/Output     10/31 0701 - 11/01 0700 11/01 0701 - 11/02 0700   I.V. (mL/kg) 1765 (21) 130.3 (1.5)   Total Intake(mL/kg) 1765 (21) 130.3 (1.5)   Urine (mL/kg/hr) 985    Total Output 985     Net +780 +130.3          PHYSICAL EXAMINATION: General:  Awake, alert HEENT: NCAT, ETT in place PULM: CTA B, no wheezing CV: RRR, no mgr AB: BS+, soft, nontender Ext; warm, no  edema Neuro: Awake and alert, interactive  LABS:  CBC Recent Labs     06/14/13  1400  06/15/13  0346  WBC  10.4  21.6*  HGB  11.7*  10.2*  HCT  35.7*  30.6*  PLT  341  313   Coag's No results found for this basename: APTT, INR,  in the last 72 hours BMET Recent Labs     06/14/13  1400  06/15/13  0346  NA  138  134*  K  3.9  4.7  CL  104  104  CO2  21  19  BUN  9  9  CREATININE  0.78  0.69  GLUCOSE  104*  130*   Electrolytes Recent Labs     06/14/13  1400  06/14/13  1610  06/15/13  0346  CALCIUM  9.4   --   8.7  MG   --   1.8   --   PHOS   --   2.0*   --    Sepsis Markers Recent Labs     06/14/13  1610  PROCALCITON  <0.10   ABG Recent Labs     06/14/13  1516  PHART  7.352  PCO2ART  34.3*  PO2ART  217.0*   Liver Enzymes Recent Labs     06/14/13  1400  06/15/13  0346  AST  13  13  ALT  12  11  ALKPHOS  59  55  BILITOT  0.2*  0.2*  ALBUMIN  3.8  3.3*   Cardiac Enzymes Recent Labs     06/14/13  1400  06/14/13  1610  06/14/13  2202  06/15/13  0346  TROPONINI  <0.30  <0.30  <0.30  <0.30  PROBNP   --   26.1   --    --    Glucose Recent Labs     06/14/13  1806  06/14/13  2020  06/15/13  0034  06/15/13  0313  06/15/13  0807  GLUCAP  133*  155*  145*  134*  143*    Imaging   CXR: ETT good position, CXR w/out infiltrates   ASSESSMENT / PLAN:  PULMONARY A: Acute respiratory failure in setting of acute asthmatic exacerbation Suspect element of VCD  P:   extubate Scheduled BDs Systemic steroids > wean 11/2 Start pulmicort bid   CARDIOVASCULAR A:  Tachycardia > resolved  P:  IV hydration Tele monitoring     RENAL A:   No acute issue  P:   Keep euvolemic Trend chemistry   GASTROINTESTINAL A:   No acute issue: although suspect GERD a contributing factor P:   ppi Advance diet 11/1 post extubation  HEMATOLOGIC A:   Anemia  Microcytic/hypochromic likely feso4 def  P:  Cbc in am  INFECTIOUS A:   Acute  bronchitis  P:   Empiric levaquin    ENDOCRINE A:   No acute issue  P:   Add ssi insulin   NEUROLOGIC A:   Acute encephalopathy resolved Anxiety Chronic pain P:   Ativan and fentanyl post extubation  TODAY'S SUMMARY:  Extubate, treat anxiety, continue steroids, add pulmicort   I have personally obtained a history, examined the patient, evaluated laboratory and imaging results, formulated the assessment and plan and placed orders. CRITICAL CARE: The patient is critically ill with multiple organ systems failure and requires high complexity decision making for assessment and support, frequent evaluation and titration of therapies, application of advanced monitoring technologies and extensive interpretation of multiple databases. Critical Care Time devoted to patient care services described in this note is 35 minutes.   Yolonda Kida PCCM Pager: [phone] Cell: [phone] If no response, call [phone]  06/15/2013, 10:19 AM

## 2013-06-15 NOTE — Progress Notes (Addendum)
Writing very legibly on whiteboard.Pt c/o back and joint pain .Fentanyl gtt increased from 322mcg/hr to 400 mcg/hr . 30 min later ,Pt stated pain was still severe . Fentanyl 100 mcg bolus given from bag .40 min later . Pt c/o anxiety ( had talked to family over the phone via nurse telling family what was written on white board  20 min earlier). Propofol increased from 35 mcg/kg/min  to 40 mcg /kg/min . Pt currently resting . Mittens on bilat hands for safety whenever nurse is not in room. the patient nodded understanding and agreement of the need for mitttens.

## 2013-06-15 NOTE — Progress Notes (Signed)
Propofol gtt decreased by half for  Vent weaning  the patient c/o chest pain . 12-lead EKG ordered.

## 2013-06-15 NOTE — Progress Notes (Signed)
40ml of versed wasted and verified with Chrys Racer

## 2013-06-15 NOTE — Progress Notes (Signed)
Pt sipped H2O w/o diff . Lungs still CTA bilat lung fields . Heart healthy diet ordered per Dr 's telephone order.

## 2013-06-16 DIAGNOSIS — F411 Generalized anxiety disorder: Secondary | ICD-10-CM

## 2013-06-16 LAB — BASIC METABOLIC PANEL
BUN: 8 mg/dL (ref 6–23)
Calcium: 9.3 mg/dL (ref 8.4–10.5)
Chloride: 103 mEq/L (ref 96–112)
Creatinine, Ser: 0.68 mg/dL (ref 0.50–1.10)
GFR calc Af Amer: >90 mL/min (ref >90)
Glucose, Bld: 111 mg/dL — ABNORMAL HIGH (ref 70–99)
Potassium: 3.5 mEq/L (ref 3.5–5.1)

## 2013-06-16 LAB — CBC WITH DIFFERENTIAL/PLATELET
Basophils Relative: 0 % (ref 0–1)
HCT: 30.1 % — ABNORMAL LOW (ref 36.0–46.0)
Hemoglobin: 9.7 g/dL — ABNORMAL LOW (ref 12.0–15.0)
Lymphocytes Relative: 11 % — ABNORMAL LOW (ref 12–46)
MCHC: 32.2 g/dL (ref 30.0–36.0)
Monocytes Absolute: 0.9 10*3/uL (ref 0.1–1.0)
Monocytes Relative: 5 % (ref 3–12)
Neutro Abs: 16.6 10*3/uL — ABNORMAL HIGH (ref 1.7–7.7)
Platelets: 314 10*3/uL (ref 150–400)

## 2013-06-16 LAB — URINE CULTURE: Culture: NO GROWTH

## 2013-06-16 MED ORDER — PANTOPRAZOLE SODIUM 40 MG PO TBEC
40.0000 mg | DELAYED_RELEASE_TABLET | Freq: Every day | ORAL | Status: DC
  Administered 2013-06-16 – 2013-06-18 (×3): 40 mg via ORAL
  Filled 2013-06-16 (×3): qty 1

## 2013-06-16 MED ORDER — MORPHINE SULFATE ER 15 MG PO TBCR
15.0000 mg | EXTENDED_RELEASE_TABLET | Freq: Two times a day (BID) | ORAL | Status: DC
  Administered 2013-06-16 (×2): 15 mg via ORAL
  Filled 2013-06-16 (×2): qty 1

## 2013-06-16 MED ORDER — ALBUTEROL SULFATE (5 MG/ML) 0.5% IN NEBU: 2.5000 mg | INHALATION_SOLUTION | Freq: Four times a day (QID) | RESPIRATORY_TRACT | Status: DC | PRN

## 2013-06-16 MED ORDER — OXYCODONE HCL 5 MG PO TABS
5.0000 mg | ORAL_TABLET | Freq: Four times a day (QID) | ORAL | Status: DC | PRN
  Administered 2013-06-16 – 2013-06-17 (×4): 5 mg via ORAL
  Filled 2013-06-16 (×5): qty 1

## 2013-06-16 MED ORDER — OXYCODONE HCL 5 MG PO TABS
5.0000 mg | ORAL_TABLET | Freq: Once | ORAL | Status: AC
  Administered 2013-06-16: 5 mg via ORAL

## 2013-06-16 MED ORDER — PREDNISONE 20 MG PO TABS
30.0000 mg | ORAL_TABLET | Freq: Every day | ORAL | Status: DC
  Administered 2013-06-16 – 2013-06-18 (×3): 30 mg via ORAL
  Filled 2013-06-16 (×4): qty 1

## 2013-06-16 MED ORDER — MOMETASONE FURO-FORMOTEROL FUM 100-5 MCG/ACT IN AERO
2.0000 | INHALATION_SPRAY | Freq: Two times a day (BID) | RESPIRATORY_TRACT | Status: DC
  Administered 2013-06-16 – 2013-06-18 (×4): 2 via RESPIRATORY_TRACT
  Filled 2013-06-16 (×3): qty 8.8

## 2013-06-16 NOTE — Progress Notes (Signed)
PULMONARY  / CRITICAL CARE MEDICINE  Name: Julie Kerr MRN: [id number] DOB: Jul 23, 1976    ADMISSION DATE:  06/14/2013 CONSULTATION DATE:  10/31  REFERRING MD :  Rancour PRIMARY SERVICE: PCCM   CHIEF COMPLAINT:  Acute respiratory failure   BRIEF PATIENT DESCRIPTION 37 year old female who presented to [facility] ED 10/31 w/ several day h/o progressive SOB and loud audible wheeze (no other hx at time of admit). Upon presentation became progressively anxious with marked increase in WOB. Intubated for respiratory distress. PCCM asked to admit.   >per ED staff she has been intubated before at another hospital   SIGNIFICANT EVENTS / STUDIES:    LINES / TUBES: OETT 10/31>>>11/1  CULTURES: Sputum 10/31>>> UC 10/31>>> bcx2 10/31>>> Viral panel 10/31>>> ustrep 10/31>>> u legionella 10/31>>  ANTIBIOTICS: levaquin 10/31>>>   SUBJECTIVE:  Lots of pain issues overnight, breathing well  VITAL SIGNS: Temp:  [99 F (37.2 C)-99.6 F (37.6 C)] 99.4 F (37.4 C) (11/02 0739) Pulse Rate:  [68-109] 83 (11/02 0739) Resp:  [7-24] 16 (11/02 0739) BP: (99-150)/(46-93) 118/72 mmHg (11/02 0700) SpO2:  [93 %-100 %] 100 % (11/02 0739) FiO2 (%):  [40 %] 40 % (11/01 1000) Weight:  [85.4 kg (188 lb 4.4 oz)] 85.4 kg (188 lb 4.4 oz) (11/02 0500) HEMODYNAMICS:   VENTILATOR SETTINGS: Vent Mode:  [-] PSV;CPAP FiO2 (%):  [40 %] 40 % PEEP:  [5 cmH20] 5 cmH20 Pressure Support:  [5 cmH20] 5 cmH20 INTAKE / OUTPUT: Intake/Output     11/01 0701 - 11/02 0700 11/02 0701 - 11/03 0700   P.O. 600    I.V. (mL/kg) 1030.3 (12.1)    Total Intake(mL/kg) 1630.3 (19.1)    Urine (mL/kg/hr) 2800 (1.4)    Emesis/NG output 120 (0.1)    Total Output 2920     Net -1289.7            PHYSICAL EXAMINATION: General:  Tearful, complaining of pain HEENT: NCAT, OP clear PULM: CTA B, no wheezing CV: RRR, no mgr AB: BS+, soft, nontender Ext; warm, no edema Neuro: Awake and alert, interactive  LABS:  CBC Recent  Labs     06/14/13  1400  06/15/13  0346  06/16/13  0415  WBC  10.4  21.6*  19.6*  HGB  11.7*  10.2*  9.7*  HCT  35.7*  30.6*  30.1*  PLT  341  313  314   Coag's No results found for this basename: APTT, INR,  in the last 72 hours BMET Recent Labs     06/14/13  1400  06/15/13  0346  06/16/13  0415  NA  138  134*  138  K  3.9  4.7  3.5  CL  104  104  103  CO2  21  19  25   BUN  9  9  8   CREATININE  0.78  0.69  0.68  GLUCOSE  104*  130*  111*   Electrolytes Recent Labs     06/14/13  1400  06/14/13  1610  06/15/13  0346  06/16/13  0415  CALCIUM  9.4   --   8.7  9.3  MG   --   1.8   --    --   PHOS   --   2.0*   --    --    Sepsis Markers Recent Labs     06/14/13  1610  PROCALCITON  <0.10   ABG Recent Labs     06/14/13  1516  PHART  7.352  PCO2ART  34.3*  PO2ART  217.0*   Liver Enzymes Recent Labs     06/14/13  1400  06/15/13  0346  AST  13  13  ALT  12  11  ALKPHOS  59  55  BILITOT  0.2*  0.2*  ALBUMIN  3.8  3.3*   Cardiac Enzymes Recent Labs     06/14/13  1400  06/14/13  1610  06/14/13  2202  06/15/13  0346  TROPONINI  <0.30  <0.30  <0.30  <0.30  PROBNP   --   26.1   --    --    Glucose Recent Labs     06/14/13  2020  06/15/13  0034  06/15/13  0313  06/15/13  0807  06/15/13  1226  06/15/13  1658  GLUCAP  155*  145*  134*  143*  121*  112*    Imaging   CXR: ETT good position, CXR w/out infiltrates   ASSESSMENT / PLAN:  PULMONARY A: Acute respiratory failure resolved, unclear if this was ever really an asthma flare as we have never heard her wheeze Strongly Suspect VCD  P:   Dulera for now Scheduled BDs D/c solumedrol Start prednisone, short taper    CARDIOVASCULAR A:  Tachycardia > resolved  P:  IV hydration    RENAL A:   No acute issue  P:   Keep euvolemic Trend chemistry   GASTROINTESTINAL A:   No acute issue: although suspect GERD a contributing factor P:   ppi > change to oral Advance diet    HEMATOLOGIC A:   Anemia  Microcytic/hypochromic likely feso4 def  P:  Cbc in am  INFECTIOUS A:   Acute bronchitis  P:   Empiric levaquin    ENDOCRINE A:   No acute issue  P:   D/c cbg/ssi  NEUROLOGIC A:   Acute encephalopathy resolved Anxiety Chronic pain P:   Prn ativan Add home pain meds  TODAY'S SUMMARY:  Transfer to [facility], floor PCCM to sign off once [facility] assumes care  Yolonda Kida PCCM Pager: [phone] Cell: [phone] If no response, call [phone]  06/16/2013, 7:49 AM

## 2013-06-16 NOTE — Evaluation (Signed)
Physical Therapy Evaluation Patient Details Name: Julie Kerr MRN: [id number] DOB: [birth year] Today's Date: 06/16/2013 Time: 3329-5188 PT Time Calculation (min): 26 min  PT Assessment / Plan / Recommendation History of Present Illness  Admitted with respiratory distress including intubation; Extubated 11/1; Also of note, pt has had back surgery withtin the past 4 mos; Family is bringing in her brace  Clinical Impression  Pt admitted with above. Pt currently with functional limitations due to the deficits listed below (see PT Problem List).  Pt will benefit from skilled PT to increase their independence and safety with mobility to allow discharge to the venue listed below.       PT Assessment  Patient needs continued PT services    Follow Up Recommendations  Outpt PT for back pain (it is likely pt's insurance will not cover HHPT) Recommend pt see a local Neurologist or Neurosurgeon for her back pain    Does the patient have the potential to tolerate intense rehabilitation      Barriers to Discharge Decreased caregiver support Must be modified independent    Equipment Recommendations  Rolling walker with 5" wheels;3in1 (PT)    Recommendations for Other Services OT consult   Frequency Min 5X/week    Precautions / Restrictions Precautions Precautions: Back Required Braces or Orthoses: Spinal Brace Spinal Brace: Applied in sitting position   Pertinent Vitals/Pain 10+/10 back pain; Pt very much wanting to get out of bed      Mobility  Bed Mobility Bed Mobility: Rolling Left;Left Sidelying to Sit;Sitting - Scoot to Edge of Bed Rolling Left: 4: Min guard;With rail Left Sidelying to Sit: 4: Min assist;With rails Sitting - Scoot to Edge of Bed: 4: Min guard Details for Bed Mobility Assistance: Cues for logroll technique; Phyaical assist to lift trunk to upright Transfers Transfers: Sit to Stand;Stand to Sit Sit to Stand: 4: Min assist;From bed Stand to Sit: 4: Min guard;With  armrests;To chair/3-in-1 Details for Transfer Assistance: Cues for technique Ambulation/Gait Ambulation/Gait Assistance: 4: Min guard Ambulation Distance (Feet): 20 Feet Assistive device: Rolling walker Ambulation/Gait Assistance Details: Cues for posture and RW proximity Gait Pattern: Decreased stride length    Exercises     PT Diagnosis: Difficulty walking;Acute pain  PT Problem List: Decreased strength;Decreased activity tolerance;Decreased balance;Decreased mobility;Decreased knowledge of use of DME;Decreased knowledge of precautions;Pain PT Treatment Interventions: DME instruction;Gait training;Stair training;Functional mobility training;Therapeutic activities;Therapeutic exercise;Balance training;Cognitive remediation     PT Goals(Current goals can be found in the care plan section) Acute Rehab PT Goals Patient Stated Goal: wants more therapy PT Goal Formulation: With patient Time For Goal Achievement: 06/23/13 Potential to Achieve Goals: Good  Visit Information  Last PT Received On: 06/16/13 Assistance Needed: +1 History of Present Illness: Admitted with respiratory distress including intubation; Extubated 11/1; Also of note, pt has had back surgery withtin the past 4 mos; Family is bringing in her brace       Prior Functioning  Home Living Family/patient expects to be discharged to:: Private residence Living Arrangements: Children;Non-relatives/Friends (ages 29 and 62) Available Help at Discharge: Family;Friend(s);Personal care attendant (Aide 2 hours/day) Type of Home: House Home Access: Stairs to enter [employer] of Steps: 3 Entrance Stairs-Rails: None Home Layout: One level Home Equipment: Walker - standard Prior Function Level of Independence: Needs assistance Gait / Transfers Assistance Needed: Assistance with stairs; Amb with walker Communication Communication: No difficulties    Cognition  Cognition Arousal/Alertness: Awake/alert Behavior  During Therapy: WFL for tasks assessed/performed Overall Cognitive Status: Within Functional Limits  for tasks assessed    Extremity/Trunk Assessment Upper Extremity Assessment Upper Extremity Assessment: Overall WFL for tasks assessed Lower Extremity Assessment Lower Extremity Assessment: Generalized weakness (and pt with incr back pain with moving)   Balance    End of Session PT - End of Session Activity Tolerance: Patient limited by pain;Other (comment) (short walk as pt did not have brace) Patient left: in chair;with call bell/phone within reach Nurse Communication: Mobility status;Patient requests pain meds  GP     Van Clines [facility] [place], [place] [phone]  06/16/2013, 5:04 PM

## 2013-06-17 DIAGNOSIS — J441 Chronic obstructive pulmonary disease with (acute) exacerbation: Principal | ICD-10-CM

## 2013-06-17 DIAGNOSIS — G894 Chronic pain syndrome: Secondary | ICD-10-CM | POA: Diagnosis present

## 2013-06-17 DIAGNOSIS — F17200 Nicotine dependence, unspecified, uncomplicated: Secondary | ICD-10-CM

## 2013-06-17 LAB — CBC WITH DIFFERENTIAL/PLATELET
Eosinophils Absolute: 0 10*3/uL (ref 0.0–0.7)
Eosinophils Relative: 0 % (ref 0–5)
HCT: 31.2 % — ABNORMAL LOW (ref 36.0–46.0)
Hemoglobin: 10 g/dL — ABNORMAL LOW (ref 12.0–15.0)
Lymphs Abs: 4.6 10*3/uL — ABNORMAL HIGH (ref 0.7–4.0)
MCH: 24.6 pg — ABNORMAL LOW (ref 26.0–34.0)
MCHC: 32.1 g/dL (ref 30.0–36.0)
MCV: 76.8 fL — ABNORMAL LOW (ref 78.0–100.0)
Monocytes Absolute: 0.8 10*3/uL (ref 0.1–1.0)
Monocytes Relative: 6 % (ref 3–12)
Neutrophils Relative %: 63 % (ref 43–77)
Platelets: 331 10*3/uL (ref 150–400)
RBC: 4.06 MIL/uL (ref 3.87–5.11)
RDW: 17 % — ABNORMAL HIGH (ref 11.5–15.5)

## 2013-06-17 LAB — BASIC METABOLIC PANEL
BUN: 9 mg/dL (ref 6–23)
CO2: 28 mEq/L (ref 19–32)
Calcium: 9.1 mg/dL (ref 8.4–10.5)
Chloride: 105 mEq/L (ref 96–112)
Glucose, Bld: 114 mg/dL — ABNORMAL HIGH (ref 70–99)
Potassium: 3.5 mEq/L (ref 3.5–5.1)
Sodium: 139 mEq/L (ref 135–145)

## 2013-06-17 LAB — GLUCOSE, CAPILLARY: Glucose-Capillary: 127 mg/dL — ABNORMAL HIGH (ref 70–99)

## 2013-06-17 MED ORDER — MORPHINE SULFATE ER 15 MG PO TBCR
15.0000 mg | EXTENDED_RELEASE_TABLET | Freq: Two times a day (BID) | ORAL | Status: DC
  Administered 2013-06-17 – 2013-06-18 (×3): 15 mg via ORAL
  Filled 2013-06-17 (×3): qty 1

## 2013-06-17 MED ORDER — OXYCODONE HCL 5 MG PO TABS: 15.0000 mg | ORAL_TABLET | Freq: Three times a day (TID) | ORAL | Status: DC | PRN

## 2013-06-17 MED ORDER — FLEET ENEMA 7-19 GM/118ML RE ENEM
1.0000 | ENEMA | Freq: Every day | RECTAL | Status: DC | PRN
  Filled 2013-06-17: qty 1

## 2013-06-17 MED ORDER — SENNA 8.6 MG PO TABS
2.0000 | ORAL_TABLET | Freq: Two times a day (BID) | ORAL | Status: DC
  Administered 2013-06-17 – 2013-06-18 (×2): 17.2 mg via ORAL
  Filled 2013-06-17 (×5): qty 2

## 2013-06-17 MED ORDER — GUAIFENESIN-DM 100-10 MG/5ML PO SYRP
5.0000 mL | ORAL_SOLUTION | ORAL | Status: DC | PRN
  Administered 2013-06-17 (×2): 5 mL via ORAL
  Filled 2013-06-17 (×3): qty 5

## 2013-06-17 MED ORDER — GUAIFENESIN ER 600 MG PO TB12
600.0000 mg | ORAL_TABLET | Freq: Two times a day (BID) | ORAL | Status: DC
  Administered 2013-06-17 – 2013-06-18 (×3): 600 mg via ORAL
  Filled 2013-06-17 (×4): qty 1

## 2013-06-17 MED ORDER — NICOTINE 7 MG/24HR TD PT24
7.0000 mg | MEDICATED_PATCH | Freq: Every day | TRANSDERMAL | Status: DC
  Administered 2013-06-17 – 2013-06-18 (×2): 7 mg via TRANSDERMAL
  Filled 2013-06-17 (×2): qty 1

## 2013-06-17 MED ORDER — VERAPAMIL HCL 120 MG PO TABS
120.0000 mg | ORAL_TABLET | Freq: Every morning | ORAL | Status: DC
  Administered 2013-06-17 – 2013-06-18 (×2): 120 mg via ORAL
  Filled 2013-06-17 (×2): qty 1

## 2013-06-17 MED ORDER — ALBUTEROL SULFATE HFA 108 (90 BASE) MCG/ACT IN AERS: 2.0000 | INHALATION_SPRAY | RESPIRATORY_TRACT | Status: DC | PRN

## 2013-06-17 MED ORDER — OXYCODONE HCL 5 MG PO TABS
15.0000 mg | ORAL_TABLET | Freq: Three times a day (TID) | ORAL | Status: DC | PRN
  Administered 2013-06-17 – 2013-06-18 (×2): 15 mg via ORAL
  Filled 2013-06-17 (×2): qty 3

## 2013-06-17 MED ORDER — BISACODYL 10 MG RE SUPP: 10.0000 mg | Freq: Every day | RECTAL | Status: DC | PRN

## 2013-06-17 MED ORDER — POLYETHYLENE GLYCOL 3350 17 G PO PACK
17.0000 g | PACK | Freq: Every day | ORAL | Status: DC
  Administered 2013-06-17 – 2013-06-18 (×2): 17 g via ORAL
  Filled 2013-06-17 (×2): qty 1

## 2013-06-17 MED ORDER — ALPRAZOLAM 0.5 MG PO TABS
1.0000 mg | ORAL_TABLET | Freq: Three times a day (TID) | ORAL | Status: DC
  Administered 2013-06-17 – 2013-06-18 (×4): 1 mg via ORAL
  Filled 2013-06-17 (×5): qty 2

## 2013-06-17 MED ORDER — MONTELUKAST SODIUM 10 MG PO TABS
10.0000 mg | ORAL_TABLET | Freq: Every morning | ORAL | Status: DC
  Administered 2013-06-17 – 2013-06-18 (×2): 10 mg via ORAL
  Filled 2013-06-17 (×2): qty 1

## 2013-06-17 NOTE — Progress Notes (Signed)
[facility] PROGRESS NOTE  Julie Kerr ZOX:[id number] DOB: January 21, 1976 DOA: 06/14/2013 PCP: Lu Duffel, MD  HPI/Brief narrative 37 year old female with history of hypertension, COPD/asthma, 2 prior VDRF episodes, back surgery, Sciatica, chronic pain for which she sees pain management, ongoing tobacco abuse, was admitted by critical care service on 10/31 with several days history of progressive dyspnea and wheezing. In the ED she became progressively anxious with marked increase work of breathing. She was intubated for respiratory distress. She was extubated on 11/1 and transferred to the hospitalist service on 11/3.   Assessment/Plan:  Acute respiratory failure - DD: Asthma/COPD flare versus VCD - Resolved. Extubated 11/1 and stable.  Asthma/COPD exacerbation - Improving - Tobacco cessation counseled. - Continue antibiotics, bronchodilators, prednisone taper  Tobacco abuse - Cessation counseled - Nicotine patch  Chronic pain/anxiety - Sees pain management as outpatient - Resume home pain regimen  Possible GERD - Continue PPI  Acute encephalopathic - Resolved  Microcytic anemia - Likely iron deficiency - Stable    DVT prophylaxis: Heparin Lines/catheters: PIV Nutrition: Regular  Activity:  Up with assistance Code Status: Full Family Communication: None Disposition Plan: Home in one to 2 days   Consultants:  CCM-signed off  Procedures:  VDRF  Antibiotics:  Levaquin   Subjective: Denies dyspnea. Complains of hacking cough with intermittent white sputum than trace blood. Feels generally sore secondary to coughing and requests cough medicine. Chronic pains-low back  Objective: Filed Vitals:   06/17/13 0606 06/17/13 0733 06/17/13 0735 06/17/13 1258  BP: 100/62  122/63 130/66  Pulse: 87  88 84  Temp: 99.1 F (37.3 C)  98.5 F (36.9 C) 98.2 F (36.8 C)  TempSrc: Oral  Oral Oral  Resp: 18  18 18   Height:      Weight:      SpO2: 98% 100%  100% 100%    Intake/Output Summary (Last 24 hours) at 06/17/13 1652 Last data filed at 06/17/13 1400  Gross per 24 hour  Intake    720 ml  Output    775 ml  Net    -55 ml   Filed Weights   06/15/13 0400 06/16/13 0500 06/16/13 2128  Weight: 84.2 kg (185 lb 10 oz) 85.4 kg (188 lb 4.4 oz) 85.45 kg (188 lb 6.1 oz)     Exam:  General exam: Moderately built an obese female sitting up comfortably in bed. Respiratory system: Clear. No increased work of breathing. Cardiovascular system: S1 & S2 heard, RRR. No JVD, murmurs, gallops, clicks or pedal edema. Gastrointestinal system: Abdomen is nondistended, soft and nontender. Normal bowel sounds heard. Central nervous system: Alert and oriented. No focal neurological deficits. Extremities: Symmetric 5 x 5 power.   Data Reviewed: Basic Metabolic Panel:  Recent Labs Lab 06/14/13 1400 06/14/13 1610 06/15/13 0346 06/16/13 0415 06/17/13 0520  NA 138  --  134* 138 139  K 3.9  --  4.7 3.5 3.5  CL 104  --  104 103 105  CO2 21  --  19 25 28   GLUCOSE 104*  --  130* 111* 114*  BUN 9  --  9 8 9   CREATININE 0.78  --  0.69 0.68 0.72  CALCIUM 9.4  --  8.7 9.3 9.1  MG  --  1.8  --   --   --   PHOS  --  2.0*  --   --   --    Liver Function Tests:  Recent Labs Lab 06/14/13 1400 06/15/13 0346  AST 13 13  ALT  12 11  ALKPHOS 59 55  BILITOT 0.2* 0.2*  PROT 7.6 6.8  ALBUMIN 3.8 3.3*   No results found for this basename: LIPASE, AMYLASE,  in the last 168 hours No results found for this basename: AMMONIA,  in the last 168 hours CBC:  Recent Labs Lab 06/14/13 1400 06/15/13 0346 06/16/13 0415 06/17/13 0520  WBC 10.4 21.6* 19.6* 14.6*  NEUTROABS 6.8  --  16.6* 9.2*  HGB 11.7* 10.2* 9.7* 10.0*  HCT 35.7* 30.6* 30.1* 31.2*  MCV 77.6* 77.1* 76.6* 76.8*  PLT 341 313 314 331   Cardiac Enzymes:  Recent Labs Lab 06/14/13 1400 06/14/13 1610 06/14/13 2202 06/15/13 0346  TROPONINI <0.30 <0.30 <0.30 <0.30   BNP (last 3  results)  Recent Labs  06/14/13 1610  PROBNP 26.1   CBG:  Recent Labs Lab 06/15/13 0807 06/15/13 1226 06/15/13 1658 06/15/13 2118 06/16/13 0850  GLUCAP 143* 121* 112* 146* 127*    Recent Results (from the past 240 hour(s))  CULTURE, BLOOD (ROUTINE X 2)     Status: None   Collection Time    06/14/13  4:15 PM      Result Value Range Status   Specimen Description BLOOD HAND RIGHT   Final   Special Requests BOTTLES DRAWN AEROBIC AND ANAEROBIC 10CC   Final   Culture  Setup Time     Final   Value: 06/14/2013 21:47     Performed at [facility]   Culture     Final   Value:        BLOOD CULTURE RECEIVED NO GROWTH TO DATE CULTURE WILL BE HELD FOR 5 DAYS BEFORE ISSUING A FINAL NEGATIVE REPORT     Performed at [facility]   Report Status PENDING   Incomplete  CULTURE, BLOOD (ROUTINE X 2)     Status: None   Collection Time    06/14/13  4:25 PM      Result Value Range Status   Specimen Description BLOOD HAND LEFT   Final   Special Requests BOTTLES DRAWN AEROBIC AND ANAEROBIC 10CC   Final   Culture  Setup Time     Final   Value: 06/14/2013 21:47     Performed at [facility]   Culture     Final   Value:        BLOOD CULTURE RECEIVED NO GROWTH TO DATE CULTURE WILL BE HELD FOR 5 DAYS BEFORE ISSUING A FINAL NEGATIVE REPORT     Performed at [facility]   Report Status PENDING   Incomplete  MRSA PCR SCREENING     Status: None   Collection Time    06/14/13  7:17 PM      Result Value Range Status   MRSA by PCR NEGATIVE  NEGATIVE Final   Comment:            The GeneXpert MRSA Assay (FDA     approved for NASAL specimens     only), is one component of a     comprehensive MRSA colonization     surveillance program. It is not     intended to diagnose MRSA     infection nor to guide or     monitor treatment for     MRSA infections.  URINE CULTURE     Status: None   Collection Time    06/14/13  9:06 PM      Result Value Range Status   Specimen  Description URINE, CATHETERIZED  Final   Special Requests NONE   Final   Culture  Setup Time     Final   Value: 06/15/2013 04:05     Performed at [facility]   Colony Count     Final   Value: NO GROWTH     Performed at [facility]   Culture     Final   Value: NO GROWTH     Performed at [facility]   Report Status 06/16/2013 FINAL   Final      Additional labs: 1. Ferritin: 18 2. Urine Legionella and pneumococcal antigen: Negative     Studies: No results found.      Scheduled Meds: . ALPRAZolam  1 mg Oral TID  . antiseptic oral rinse  15 mL Mouth Rinse QID  . guaiFENesin  600 mg Oral BID  . heparin  5,000 Units Subcutaneous Q8H  . levofloxacin (LEVAQUIN) IV  750 mg Intravenous Q24H  . mometasone-formoterol  2 puff Inhalation BID  . montelukast  10 mg Oral q morning - 10a  . morphine  15 mg Oral BID  . pantoprazole  40 mg Oral Q1200  . polyethylene glycol  17 g Oral Daily  . predniSONE  30 mg Oral Q breakfast  . senna  2 tablet Oral BID  . verapamil  120 mg Oral q morning - 10a   Continuous Infusions:   Principal Problem:   Acute respiratory failure Active Problems:   COPD exacerbation   Tobacco use disorder   Chronic pain syndrome    Time spent: 40 minutes.    Marcellus Scott, MD, FACP, FHM. [facility] Pager [phone]  If 7PM-7AM, please contact night-coverage [email or web address] Password [email or web address] 06/17/2013, 4:52 PM    LOS: 3 days

## 2013-06-17 NOTE — Care Management Note (Signed)
   CARE MANAGEMENT NOTE 06/17/2013  Patient:  Julie Kerr, Julie Kerr   Account Number:  [id number]  Date Initiated:  06/17/2013  Documentation initiated by:  Johny Shock  Subjective/Objective Assessment:   PT/OT eval recommending DME for home use.     Action/Plan:   Met with pt and RW, 3:1 and tub bench ordered and [facility] to deliver to room. Also noted outpatient PT recommendation, unclear if pt would have transportation.   Anticipated DC Date:  06/19/2013   Anticipated DC Plan:           Choice offered to / List presented to:     DME arranged  3-N-1  TUB BENCH  [facility] Hurst      DME agency  [facility]        Status of service:  In process, will continue to follow Medicare Important Message given?   (If response is "NO", the following Medicare IM given date fields will be blank) Date Medicare IM given:   Date Additional Medicare IM given:    Discharge Disposition:    Per UR Regulation:    If discussed at Long Length of Stay Meetings, dates discussed:    Comments:

## 2013-06-17 NOTE — Progress Notes (Signed)
Physical Therapy Treatment Patient Details Name: Julie Kerr MRN: [id number] DOB: 09-02-1975 Today's Date: 06/17/2013 Time: 1120-1140 PT Time Calculation (min): 20 min  PT Assessment / Plan / Recommendation  History of Present Illness Admitted with respiratory distress including intubation; Extubated 11/1; Also of note, pt has had back surgery withtin the past 4 mos; Family is bringing in her brace   PT Comments   Pt able to increase ambulation distance with max encouragement and min (A). Pt seemed to be inconsistent with gt; at times Rt LE would buckle, then Lt LE would buckle. Pt concerned about D/C disposition due to lack of equipment and (A) she has at home. Pt is a fall risk. PT will cont to follow per POC till acute D/C.   Follow Up Recommendations  Outpatient PT;Supervision for mobility/OOB;Supervision/Assistance - 24 hour     Does the patient have the potential to tolerate intense rehabilitation     Barriers to Discharge        Equipment Recommendations  Rolling walker with 5" wheels;3in1 (PT);Other (comment) (pt to benefit from shower chair to improve safety with ADLS )    Recommendations for Other Services OT consult  Frequency Min 5X/week   Progress towards PT Goals Progress towards PT goals: Progressing toward goals  Plan Current plan remains appropriate    Precautions / Restrictions Precautions Precautions: Back Precaution Comments: pt educated on back precautions; pt reports she did not have back precautions after surger; will need clarification of precautions  Required Braces or Orthoses: Spinal Brace Spinal Brace: Applied in sitting position Restrictions Weight Bearing Restrictions: No   Pertinent Vitals/Pain 10/10 with activity; pt premedicated     Mobility  Bed Mobility Bed Mobility: Left Sidelying to Sit;Rolling Left Rolling Left: 5: Supervision;With rail Left Sidelying to Sit: 5: Supervision;With rails;HOB flat Details for Bed Mobility Assistance: cues  for log rolling; pt requires incr time to reach EOB; c/o pain with log rolling  Transfers Transfers: Sit to Stand;Stand to Sit Sit to Stand: 4: Min guard;From bed Stand to Sit: 4: Min guard;To chair/3-in-1;With armrests Details for Transfer Assistance: cues for hand placement and safeety with RW; min guard to steady and for safety  Ambulation/Gait Ambulation/Gait Assistance: 4: Min assist;4: Min guard Ambulation Distance (Feet): 60 Feet Assistive device: Rolling walker Ambulation/Gait Assistance Details: pt with inconsistent buckling of bil LEs; pt reports " i cant control them, they just give out"; cues for sequencing and management of RW: pt unsteady without AD; will benefit from RW to reduce risk of fall upon acute D/C  Gait Pattern: Decreased stride length Gait velocity: very decreased  General Gait Details: cues to avoid twisting with turns  Stairs: No Wheelchair Mobility Wheelchair Mobility: No         PT Diagnosis:    PT Problem List:   PT Treatment Interventions:     PT Goals (current goals can now be found in the care plan section) Acute Rehab PT Goals Patient Stated Goal: to go home and have more therapy  PT Goal Formulation: With patient Time For Goal Achievement: 06/23/13 Potential to Achieve Goals: Good  Visit Information  Last PT Received On: 06/17/13 Assistance Needed: +1 History of Present Illness: Admitted with respiratory distress including intubation; Extubated 11/1; Also of note, pt has had back surgery withtin the past 4 mos; Family is bringing in her brace    Subjective Data  Subjective: pt lying supine; agreeable to therapy. "i just want to get some sleep but we can try"  Patient Stated Goal: to go home and have more therapy    Cognition  Cognition Arousal/Alertness: Awake/alert Behavior During Therapy: WFL for tasks assessed/performed Overall Cognitive Status: Within Functional Limits for tasks assessed    Balance  Balance Balance Assessed:  Yes Static Sitting Balance Static Sitting - Balance Support: Right upper extremity supported;Feet supported Static Sitting - Level of Assistance: 5: Stand by assistance Static Sitting - Comment/# of Minutes: pt tolerated sitting ~3 min  End of Session PT - End of Session Equipment Utilized During Treatment: Gait belt;Back brace Activity Tolerance: Patient limited by pain;Patient limited by fatigue Patient left: in chair;with call bell/phone within reach Nurse Communication: Mobility status   GP     Donell Sievert , [place] [phone]  06/17/2013, 2:08 PM

## 2013-06-18 ENCOUNTER — Inpatient Hospital Stay (HOSPITAL_COMMUNITY): Payer: Medicaid Other

## 2013-06-18 MED ORDER — FLUTICASONE-SALMETEROL 250-50 MCG/DOSE IN AEPB: 1.0000 | INHALATION_SPRAY | Freq: Two times a day (BID) | RESPIRATORY_TRACT | Status: DC

## 2013-06-18 MED ORDER — LEVOFLOXACIN 750 MG PO TABS: 750.0000 mg | ORAL_TABLET | ORAL | Status: DC

## 2013-06-18 MED ORDER — NICOTINE 7 MG/24HR TD PT24: 7.0000 mg | MEDICATED_PATCH | Freq: Every day | TRANSDERMAL | Status: DC

## 2013-06-18 MED ORDER — GUAIFENESIN-DM 100-10 MG/5ML PO SYRP: 5.0000 mL | ORAL_SOLUTION | ORAL | Status: DC | PRN

## 2013-06-18 MED ORDER — LEVOFLOXACIN 750 MG PO TABS
750.0000 mg | ORAL_TABLET | ORAL | Status: DC
  Filled 2013-06-18: qty 1

## 2013-06-18 MED ORDER — GUAIFENESIN ER 600 MG PO TB12: 600.0000 mg | ORAL_TABLET | Freq: Two times a day (BID) | ORAL | Status: DC

## 2013-06-18 MED ORDER — PREDNISONE 10 MG PO TABS: ORAL_TABLET | ORAL | Status: DC

## 2013-06-18 MED ORDER — PANTOPRAZOLE SODIUM 40 MG PO TBEC: 40.0000 mg | DELAYED_RELEASE_TABLET | Freq: Every day | ORAL | Status: DC

## 2013-06-18 NOTE — Progress Notes (Signed)
This patient is receiving levofloxacin IV Day 4. Based on criteria approved by the Pharmacy and Therapeutics Committee, and the Infectious Disease Division, the antibiotic(s) is / are being converted to equivalent oral dose form(s). These criteria include: . Patient being treated for a respiratory tract infection . The patient is not neutropenic and does not exhibit a GI malabsorption state . The patient is eating (either orally or per tube) and/or has been taking other orally administered medications for at least 24 hours. . The patient is improving clinically (physician assessment and a 24-hour Tmax of ? 100.5? F).  If you have questions about this conversion, please contact the pharmacy department. Thank you.  Wilfred Lacy, PharmD Clinical Pharmacist [phone] 06/18/2013, 11:26

## 2013-06-18 NOTE — Progress Notes (Signed)
Event: 06/17/13 @ 2250:  Notified by RN that pt found in room sitting on floor. Pt stated she fell when she attempted to get up to go turn up the heat in the room. Pt reported she hit her head on the bedside table and now having pain to her forehead and pain to her back and (L) hip. RN states pt had been arguing with her significant other on the phone prior to fall. After the reported fall pt ask the RN to call her girlfriend and tell her she fell. RN states pt's story regarding how she fell did not seem to be c/w placement of the bedside table and where pt fell. There is some suspicion this may be attention seeking behavior in nature. Last sedating medication approx 1730 this evening. Pt has been observed ambulating w/ walker up and down halls and in room w/o difficulty all evening. RN reports pt has been crying since event. NP to bedside. Subjective: Pt reports she fell when she got OOB to go turn heat up in the room. She states she tripped and fell over the IV pole. She denies LOC but states she hit her forehead and is also c/o mid and lower back pain and (L) hip pain. Pt denies dizziness, CP, SOB  or other symptoms prior to fall.  Objective: Julie Kerr is a 37 year old female with history of HTN, COPD/asthma, 2 prior VDRF episodes, back surgery, Sciatica, chronic pain for which she sees pain management and ongoing tobacco abuse who was admitted by critical care service on 10/31 after she presented to ED w/ c/o several days h/o progressive dyspnea and wheezing. In the ED she became progressively anxious with marked increase work of breathing. She was intubated for respiratory distress. She was extubated on 11/1 and transferred to the hospitalist service on 06/17/13. At bedside pt noted lying in bed sobbing w/ ice pack to her head. She has a small 1.5 cm hematoma to her mid-forehead. No open wound. Face and head otherwise unremarkable. PEARRL, CN II-XII appear grossly intact. No chest wall TTP, no c-spine bony  TTP, T-spine and L-spine TTP. Pain to (L) hip w/ movement and palpation. No external foot rotation. No abrasions or skin tears. Abd soft and nt w/ normal bs. VSS. T-spine, L-spine and (L) hip xrays obtained. Assessment/Plan: 1. Unwitnessed mechanical fall: Small hematoma on forehead. No other signs of trauma. All imaging negative for fracture or other acute findings. Ice to sore areas. Pt to call for assistance when getting OOB. RN to place bed alarm. Will continue to monitor closely.  Leanne Chang, NP-C [facility] Pager [phone]

## 2013-06-18 NOTE — Discharge Summary (Signed)
Physician Discharge Summary  Julie Kerr WUJ:[id number] DOB: [birth year] DOA: 06/14/2013  PCP: Lu Duffel, MD  Admit date: 06/14/2013 Discharge date: 06/18/2013  Time spent: Greater than 30 minutes  Recommendations for Outpatient Follow-up:  1. Dr. Lu Duffel, PCP in 3 days. 2. Pain Management. 3. Home Health RN, 3 n 1, tub bench & rolling walker with 5 inch wheels 4. Followup final blood culture results that were drawn on 06/14/13 in the hospital.  Discharge Diagnoses:  Principal Problem:   Acute respiratory failure Active Problems:   COPD exacerbation   Tobacco use disorder   Chronic pain syndrome   Discharge Condition: Improved & Stable  Diet recommendation: Heart healthy diet  Filed Weights   06/16/13 0500 06/16/13 2128 06/17/13 2053  Weight: 85.4 kg (188 lb 4.4 oz) 85.45 kg (188 lb 6.1 oz) 84.6 kg (186 lb 8.2 oz)    History of present illness:  37 year old female with history of hypertension, COPD/asthma, 2 prior VDRF episodes, back surgery, Sciatica, chronic pain for which she sees pain management, ongoing tobacco abuse, was admitted by critical care service on 10/31 with several days history of progressive dyspnea and wheezing. In the ED she became progressively anxious with marked increase work of breathing. She was intubated for respiratory distress. She was extubated on 11/1 and transferred to the hospitalist service on 11/3.  Hospital Course:   Acute respiratory failure  - DD: Asthma/COPD flare versus VCD  - Resolved. Extubated 11/1 and stable.   Asthma/COPD exacerbation  - Improving  - Tobacco cessation counseled again.  - Continue antibiotics, bronchodilators, prednisone taper   Tobacco abuse  - Cessation counseled  - Nicotine patch   Chronic pain/anxiety  - Sees pain management as outpatient  - Resumed home pain regimen   Possible GERD  - Continue PPI   Acute encephalopathic  - Resolved   Microcytic anemia  - Likely iron deficiency  -  Stable. Consider iron supplements as outpatient.  Constipation - Home bowel regimen.  Unwitnessed mechanical fall - Patient denies pain. Imagings negative for fractures or acute findings - Patient states that she lives with her 2 children and has an aide who can provide her with 24/7 assistance at home.  - As per case management, patient does not qualify for home health PT.  Leukocytosis - Secondary to steroids. Improving.    Consultations:  CCM  Procedures:  Ventilatory dependent respiratory failure    Discharge Exam:  Complaints: Denies complaints and is anxious to go home.  Filed Vitals:   06/18/13 0420 06/18/13 0556 06/18/13 0746 06/18/13 1045  BP: 115/60 93/52  108/53  Pulse:  74  85  Temp: 98.1 F (36.7 C) 98.8 F (37.1 C)  98.9 F (37.2 C)  TempSrc: Oral Oral  Oral  Resp:  16  18  Height:      Weight:      SpO2:  99% 96% 99%    General exam: Moderately built an obese female sitting up comfortably in bed.  Respiratory system: Clear. No increased work of breathing.  Cardiovascular system: S1 & S2 heard, RRR. No JVD, murmurs, gallops, clicks or pedal edema.  Gastrointestinal system: Abdomen is nondistended, soft and nontender. Normal bowel sounds heard.  Central nervous system: Alert and oriented. No focal neurological deficits.  Extremities: Symmetric 5 x 5 power. Head: Small ? 2 cm diameter superficial midline forehead swelling without any acute findings.   Discharge Instructions      Discharge Orders   Future Orders Complete By Expires  Call MD for:  difficulty breathing, headache or visual disturbances  As directed    Call MD for:  severe uncontrolled pain  As directed    Call MD for:  temperature >100.4  As directed    Diet - low sodium heart healthy  As directed    Increase activity slowly  As directed        Medication List         albuterol 108 (90 BASE) MCG/ACT inhaler  Commonly known as:  PROVENTIL HFA;VENTOLIN HFA  Inhale 2 puffs  into the lungs every 4 (four) hours as needed for wheezing.     ALPRAZolam 1 MG tablet  Commonly known as:  XANAX  Take 1 mg by mouth 3 (three) times daily.     Fluticasone-Salmeterol 250-50 MCG/DOSE Aepb  Commonly known as:  ADVAIR  Inhale 1 puff into the lungs 2 (two) times daily.     guaiFENesin 600 MG 12 hr tablet  Commonly known as:  MUCINEX  Take 1 tablet (600 mg total) by mouth 2 (two) times daily.     guaiFENesin-dextromethorphan 100-10 MG/5ML syrup  Commonly known as:  ROBITUSSIN DM  Take 5 mLs by mouth every 4 (four) hours as needed for cough.     levofloxacin 750 MG tablet  Commonly known as:  LEVAQUIN  Take 1 tablet (750 mg total) by mouth daily.     morphine 15 MG 12 hr tablet  Commonly known as:  MS CONTIN  Take 15 mg by mouth every 12 (twelve) hours.     nicotine 7 mg/24hr patch  Commonly known as:  NICODERM CQ - dosed in mg/24 hr  Place 1 patch (7 mg total) onto the skin daily.     oxyCODONE 15 MG immediate release tablet  Commonly known as:  ROXICODONE  Take 15 mg by mouth 3 (three) times daily as needed for pain (breakthrough).     pantoprazole 40 MG tablet  Commonly known as:  PROTONIX  Take 1 tablet (40 mg total) by mouth daily at 12 noon.     predniSONE 10 MG tablet  Commonly known as:  DELTASONE  Take 2 tablets by mouth daily x3 days, then 1 tablet daily x3 days, then stop.     SINGULAIR 10 MG tablet  Generic drug:  montelukast  Take 10 mg by mouth every morning.     verapamil 120 MG tablet  Commonly known as:  CALAN  Take 120 mg by mouth every morning.       Follow-up Information   Follow up with WELLS,WENDELL, MD. Schedule an appointment as soon as possible for a visit in 3 days.   Specialty:  Family Medicine      The results of significant diagnostics from this hospitalization (including imaging, microbiology, ancillary and laboratory) are listed below for reference.    Significant Diagnostic Studies: Dg Thoracic Spine  W/swimmers  06/18/2013   CLINICAL DATA:  37 year old female status post fall with pain. Initial encounter. History of spine fusion.  EXAM: THORACIC SPINE - 2 VIEW + SWIMMERS  COMPARISON:  04/2013.  FINDINGS: Absent or hypoplastic 12th ribs. Bone mineralization is within normal limits. Stable thoracic vertebral height and alignment. Stable and relatively preserved thoracic disc spaces. Cervicothoracic junction alignment is within normal limits. Grossly stable visualized thoracic visceral contours.  IMPRESSION: No acute fracture or listhesis identified in the thoracic spine.   Electronically Signed   By: Augusto Gamble M.D.   On: 06/18/2013 00:32   Dg Lumbar  Spine Complete  06/18/2013   CLINICAL DATA:  37 year old female status post fall with pain. Initial encounter. Previous spinal fusion.  EXAM: LUMBAR SPINE - COMPLETE 4+ VIEW  COMPARISON:  04/2013 and earlier.  FINDINGS: Same numbering system as on 04/07/2013, but note that only 11 full size ribs are present in this patient.  Sequelae of posterior and interbody fusion at L5-S1. Superimposed fractured right S1 pedicle screw tip we identified. Hardware appears stable. L5-S1 height and alignment appears stable. Lumbar vertebral height and alignment elsewhere and lumbar disc spaces elsewhere appears stable. Bone mineralization is within normal limits. Sacral ala and SI joints within normal limits.  IMPRESSION: Stable postoperative appearance of the lumbar spine.  Same numbering system used as on 04/07/2013, but note that thoracic radiographs demonstrate only 11 full sized ribs in this patient.   Electronically Signed   By: Augusto Gamble M.D.   On: 06/18/2013 00:35   Dg Hip Complete Left  06/18/2013   CLINICAL DATA:  37 year old female status post fall with pain. Initial encounter.  EXAM: LEFT HIP - COMPLETE 2+ VIEW  COMPARISON:  None.  FINDINGS: Femoral head is normally located. Joint space is preserved. Proximal left femur intact. Bone mineralization is within normal  limits. Pelvis intact. sacral ala and SI joints within normal limits. Postoperative changes in the lower lumbar spine.  IMPRESSION: No acute fracture or dislocation identified about the left hip or pelvis.   Electronically Signed   By: Augusto Gamble M.D.   On: 06/18/2013 00:36   Dg Chest Portable 1 View  06/14/2013   CLINICAL DATA:  Status post intubation. Shortness of breath.  EXAM: PORTABLE CHEST - 1 VIEW  COMPARISON:  One-view chest 06/14/2013 at 2:12 p.m.  FINDINGS: The patient is now intubated. Endotracheal tube terminates 3.5 cm above the chronic, in satisfactory position. Low lung volumes exaggerate the heart size. No focal airspace disease is evident.  IMPRESSION: 1. Satisfactory positioning of the endotracheal tube. 2. No acute cardiopulmonary disease.   Electronically Signed   By: Gennette Pac M.D.   On: 06/14/2013 15:14   Dg Chest Portable 1 View  06/14/2013   CLINICAL DATA:  Cough, congestion, respiratory distress  EXAM: PORTABLE CHEST - 1 VIEW  COMPARISON:  05/04/2013  FINDINGS: Cardiomediastinal silhouette is stable. No acute infiltrate or pleural effusion. No pulmonary edema. Mild left basilar atelectasis.  IMPRESSION: No acute infiltrate or pulmonary edema. Mild left basilar atelectasis.   Electronically Signed   By: Natasha Mead M.D.   On: 06/14/2013 14:21    Microbiology: Recent Results (from the past 240 hour(s))  CULTURE, BLOOD (ROUTINE X 2)     Status: None   Collection Time    06/14/13  4:15 PM      Result Value Range Status   Specimen Description BLOOD HAND RIGHT   Final   Special Requests BOTTLES DRAWN AEROBIC AND ANAEROBIC 10CC   Final   Culture  Setup Time     Final   Value: 06/14/2013 21:47     Performed at [facility]   Culture     Final   Value:        BLOOD CULTURE RECEIVED NO GROWTH TO DATE CULTURE WILL BE HELD FOR 5 DAYS BEFORE ISSUING A FINAL NEGATIVE REPORT     Performed at [facility]   Report Status PENDING   Incomplete  CULTURE, BLOOD  (ROUTINE X 2)     Status: None   Collection Time    06/14/13  4:25 PM      Result Value Range Status   Specimen Description BLOOD HAND LEFT   Final   Special Requests BOTTLES DRAWN AEROBIC AND ANAEROBIC 10CC   Final   Culture  Setup Time     Final   Value: 06/14/2013 21:47     Performed at [facility]   Culture     Final   Value:        BLOOD CULTURE RECEIVED NO GROWTH TO DATE CULTURE WILL BE HELD FOR 5 DAYS BEFORE ISSUING A FINAL NEGATIVE REPORT     Performed at [facility]   Report Status PENDING   Incomplete  MRSA PCR SCREENING     Status: None   Collection Time    06/14/13  7:17 PM      Result Value Range Status   MRSA by PCR NEGATIVE  NEGATIVE Final   Comment:            The GeneXpert MRSA Assay (FDA     approved for NASAL specimens     only), is one component of a     comprehensive MRSA colonization     surveillance program. It is not     intended to diagnose MRSA     infection nor to guide or     monitor treatment for     MRSA infections.  URINE CULTURE     Status: None   Collection Time    06/14/13  9:06 PM      Result Value Range Status   Specimen Description URINE, CATHETERIZED   Final   Special Requests NONE   Final   Culture  Setup Time     Final   Value: 06/15/2013 04:05     Performed at [facility]   Colony Count     Final   Value: NO GROWTH     Performed at [facility]   Culture     Final   Value: NO GROWTH     Performed at [facility]   Report Status 06/16/2013 FINAL   Final     Labs: Basic Metabolic Panel:  Recent Labs Lab 06/14/13 1400 06/14/13 1610 06/15/13 0346 06/16/13 0415 06/17/13 0520  NA 138  --  134* 138 139  K 3.9  --  4.7 3.5 3.5  CL 104  --  104 103 105  CO2 21  --  19 25 28   GLUCOSE 104*  --  130* 111* 114*  BUN 9  --  9 8 9   CREATININE 0.78  --  0.69 0.68 0.72  CALCIUM 9.4  --  8.7 9.3 9.1  MG  --  1.8  --   --   --   PHOS  --  2.0*  --   --   --    Liver Function  Tests:  Recent Labs Lab 06/14/13 1400 06/15/13 0346  AST 13 13  ALT 12 11  ALKPHOS 59 55  BILITOT 0.2* 0.2*  PROT 7.6 6.8  ALBUMIN 3.8 3.3*   No results found for this basename: LIPASE, AMYLASE,  in the last 168 hours No results found for this basename: AMMONIA,  in the last 168 hours CBC:  Recent Labs Lab 06/14/13 1400 06/15/13 0346 06/16/13 0415 06/17/13 0520  WBC 10.4 21.6* 19.6* 14.6*  NEUTROABS 6.8  --  16.6* 9.2*  HGB 11.7* 10.2* 9.7* 10.0*  HCT 35.7* 30.6* 30.1* 31.2*  MCV 77.6* 77.1* 76.6* 76.8*  PLT 341  313 314 331   Cardiac Enzymes:  Recent Labs Lab 06/14/13 1400 06/14/13 1610 06/14/13 2202 06/15/13 0346  TROPONINI <0.30 <0.30 <0.30 <0.30   BNP: BNP (last 3 results)  Recent Labs  06/14/13 1610  PROBNP 26.1   CBG:  Recent Labs Lab 06/15/13 0807 06/15/13 1226 06/15/13 1658 06/15/13 2118 06/16/13 0850  GLUCAP 143* 121* 112* 146* 127*    Additional labs:  1. Ferritin: 18 2. Urine Legionella and pneumococcal antigen: Negative    Signed:  Marcellus Scott, MD, FACP, FHM. [facility] Pager [phone]  If 7PM-7AM, please contact night-coverage [email or web address] Password [email or web address] 06/18/2013, 1:24 PM

## 2013-06-18 NOTE — Progress Notes (Signed)
Late entry 06/17/2013 2200 Called to room to find patient sitting  on floor crying..Pt stated"she was getting out of bed to turn the heat on and tripped on the IV pole and fell and hit her head on the side table and fell on her right hip.Nikki,Kami.and myself assisted pt to the bed.pt was alert and oriented before the fall,pt was standing at sink washing herself up during shift change,pt ambulated in hall with walker before she went to bed.Pt c/o her head,rt hip,and back hurting after she fell,.Vital signs stable,neuro checks perform,Karen the PA notified around 2235,Latrice her girlfriend notified around 2300.Pt had no signs of reddness , bruising,or break in the skin.Pt did develop a bump to right side of forehead.pt had no c/o of dizziness,headache,or blurred vision,safety huddle done at bedside will continue to monitor.

## 2013-06-19 LAB — RSV(RESPIRATORY SYNCYTIAL VIRUS) AB, BLOOD

## 2013-06-19 LAB — EPSTEIN-BARR VIRUS VCA, IGM: EBV VCA IgM: <10 U/mL (ref <36.0)

## 2013-06-20 LAB — CULTURE, BLOOD (ROUTINE X 2)
Culture: NO GROWTH
Culture: NO GROWTH

## 2013-08-03 ENCOUNTER — Encounter (HOSPITAL_COMMUNITY): Payer: Self-pay | Admitting: Emergency Medicine

## 2013-08-03 ENCOUNTER — Emergency Department (HOSPITAL_COMMUNITY): Payer: Medicaid Other

## 2013-08-03 ENCOUNTER — Inpatient Hospital Stay (HOSPITAL_COMMUNITY)
Admission: EM | Admit: 2013-08-03 | Discharge: 2013-08-06 | DRG: 871 | Disposition: A | Discharge status: Home / Self Care | Payer: Medicaid Other | Attending: Internal Medicine | Admitting: Internal Medicine

## 2013-08-03 DIAGNOSIS — J45901 Unspecified asthma with (acute) exacerbation: Secondary | ICD-10-CM

## 2013-08-03 DIAGNOSIS — E43 Unspecified severe protein-calorie malnutrition: Secondary | ICD-10-CM | POA: Diagnosis present

## 2013-08-03 DIAGNOSIS — Z79899 Other long term (current) drug therapy: Secondary | ICD-10-CM

## 2013-08-03 DIAGNOSIS — G894 Chronic pain syndrome: Secondary | ICD-10-CM

## 2013-08-03 DIAGNOSIS — F411 Generalized anxiety disorder: Secondary | ICD-10-CM | POA: Diagnosis present

## 2013-08-03 DIAGNOSIS — J9600 Acute respiratory failure, unspecified whether with hypoxia or hypercapnia: Secondary | ICD-10-CM

## 2013-08-03 DIAGNOSIS — D72829 Elevated white blood cell count, unspecified: Secondary | ICD-10-CM | POA: Diagnosis present

## 2013-08-03 DIAGNOSIS — J441 Chronic obstructive pulmonary disease with (acute) exacerbation: Secondary | ICD-10-CM | POA: Diagnosis present

## 2013-08-03 DIAGNOSIS — D649 Anemia, unspecified: Secondary | ICD-10-CM | POA: Diagnosis present

## 2013-08-03 DIAGNOSIS — A419 Sepsis, unspecified organism: Principal | ICD-10-CM | POA: Diagnosis present

## 2013-08-03 DIAGNOSIS — Z981 Arthrodesis status: Secondary | ICD-10-CM

## 2013-08-03 DIAGNOSIS — F17200 Nicotine dependence, unspecified, uncomplicated: Secondary | ICD-10-CM

## 2013-08-03 DIAGNOSIS — J069 Acute upper respiratory infection, unspecified: Secondary | ICD-10-CM | POA: Diagnosis present

## 2013-08-03 HISTORY — DX: Bronchitis, not specified as acute or chronic: J40

## 2013-08-03 LAB — CBC WITH DIFFERENTIAL/PLATELET
Basophils Absolute: 0 10*3/uL (ref 0.0–0.1)
Basophils Relative: 0 % (ref 0–1)
Eosinophils Absolute: 0 10*3/uL (ref 0.0–0.7)
Eosinophils Relative: 0 % (ref 0–5)
Hemoglobin: 10.5 g/dL — ABNORMAL LOW (ref 12.0–15.0)
Lymphs Abs: 3.4 10*3/uL (ref 0.7–4.0)
MCH: 25.3 pg — ABNORMAL LOW (ref 26.0–34.0)
MCHC: 32.4 g/dL (ref 30.0–36.0)
MCV: 78.1 fL (ref 78.0–100.0)
Monocytes Absolute: 0.3 10*3/uL (ref 0.1–1.0)
Neutro Abs: 4.7 10*3/uL (ref 1.7–7.7)
Neutrophils Relative %: 55 % (ref 43–77)
Platelets: 270 10*3/uL (ref 150–400)
RBC: 4.15 MIL/uL (ref 3.87–5.11)
RDW: 17.9 % — ABNORMAL HIGH (ref 11.5–15.5)

## 2013-08-03 LAB — BASIC METABOLIC PANEL
Chloride: 106 mEq/L (ref 96–112)
Creatinine, Ser: 0.66 mg/dL (ref 0.50–1.10)
GFR calc Af Amer: >90 mL/min (ref >90)
GFR calc non Af Amer: >90 mL/min (ref >90)
Potassium: 2.8 mEq/L — ABNORMAL LOW (ref 3.5–5.1)

## 2013-08-03 MED ORDER — MORPHINE SULFATE 4 MG/ML IJ SOLN
4.0000 mg | Freq: Once | INTRAMUSCULAR | Status: AC
  Administered 2013-08-03: 4 mg via INTRAVENOUS
  Filled 2013-08-03: qty 1

## 2013-08-03 MED ORDER — ACETAMINOPHEN 325 MG PO TABS
650.0000 mg | ORAL_TABLET | Freq: Once | ORAL | Status: AC
  Administered 2013-08-03: 650 mg via ORAL
  Filled 2013-08-03: qty 2

## 2013-08-03 MED ORDER — METHYLPREDNISOLONE SODIUM SUCC 125 MG IJ SOLR
125.0000 mg | Freq: Once | INTRAMUSCULAR | Status: AC
  Administered 2013-08-03: 125 mg via INTRAVENOUS
  Filled 2013-08-03: qty 2

## 2013-08-03 MED ORDER — ALBUTEROL (5 MG/ML) CONTINUOUS INHALATION SOLN
10.0000 mg/h | INHALATION_SOLUTION | RESPIRATORY_TRACT | Status: DC
  Administered 2013-08-03: 10 mg/h via RESPIRATORY_TRACT
  Filled 2013-08-03: qty 20

## 2013-08-03 MED ORDER — IPRATROPIUM BROMIDE 0.02 % IN SOLN
0.5000 mg | Freq: Once | RESPIRATORY_TRACT | Status: AC
  Administered 2013-08-03: 0.5 mg via RESPIRATORY_TRACT
  Filled 2013-08-03: qty 2.5

## 2013-08-03 MED ORDER — ALBUTEROL SULFATE (5 MG/ML) 0.5% IN NEBU
5.0000 mg | INHALATION_SOLUTION | Freq: Once | RESPIRATORY_TRACT | Status: AC
  Administered 2013-08-03: 5 mg via RESPIRATORY_TRACT
  Filled 2013-08-03: qty 1

## 2013-08-03 MED ORDER — POTASSIUM CHLORIDE CRYS ER 20 MEQ PO TBCR
40.0000 meq | EXTENDED_RELEASE_TABLET | Freq: Once | ORAL | Status: AC
  Administered 2013-08-03: 40 meq via ORAL
  Filled 2013-08-03: qty 2

## 2013-08-03 MED ORDER — BENZONATATE 100 MG PO CAPS
100.0000 mg | ORAL_CAPSULE | Freq: Once | ORAL | Status: AC
  Administered 2013-08-03: 100 mg via ORAL
  Filled 2013-08-03: qty 1

## 2013-08-03 MED ORDER — MAGNESIUM SULFATE 40 MG/ML IJ SOLN
2.0000 g | Freq: Once | INTRAMUSCULAR | Status: AC
  Administered 2013-08-03: 2 g via INTRAVENOUS
  Filled 2013-08-03: qty 50

## 2013-08-03 NOTE — ED Provider Notes (Signed)
[place] of continuous cough for the past 2 days accompanied by shortness of breath. Maximum temperature 100.8 at home. On exam patient is coughing continuously. Speaks in short phrases. Lungs with a Or wheezes. Frequent coughs.  Doug Sou, MD 08/03/13 2236

## 2013-08-03 NOTE — ED Provider Notes (Signed)
CSN: [id number]     Arrival date & time 08/03/13  2001 History   First MD Initiated Contact with Patient 08/03/13 2050     Chief Complaint  Patient presents with  . Shortness of Breath  . Cough   Patient is a 37 y.o. female presenting with wheezing.  Wheezing Severity:  Severe Severity compared to prior episodes:  Similar Onset quality:  Gradual Duration:  2 days Timing:  Constant Progression:  Worsening Chronicity:  New Relieved by:  Beta-agonist inhaler Associated symptoms: cough and shortness of breath   Associated symptoms: no fever     37 y/o female with history of asthma who presents with cc of cough, SOB, and wheezing. Symptoms began 2 days ago. She has had fever with a temperature of 102 at home. She has been using her albuterol nebs at home without improvement. She denies any sick contacts. She states her symptoms are c/w her previous asthma exacerbations.   Past Medical History  Diagnosis Date  . Asthma   . Anxiety   . COPD (chronic obstructive pulmonary disease)   . Bronchitis    Past Surgical History  Procedure Laterality Date  . Spinal fusion     History reviewed. No pertinent family history. History  Substance Use Topics  . Smoking status: Current Every Day Smoker  . Smokeless tobacco: Not on file  . Alcohol Use: No   OB History   Grav Para Term Preterm Abortions TAB SAB Ect Mult Living                 Review of Systems  Constitutional: Negative for fever and chills.  Respiratory: Positive for cough, shortness of breath and wheezing.   Gastrointestinal: Negative for nausea and vomiting.  All other systems reviewed and are negative.    Allergies  Robitussin dm; Nsaids; and Tramadol  Home Medications   No current outpatient prescriptions on file. BP 118/55  Pulse 99  Temp(Src) 99.9 F (37.7 C) (Oral)  Resp 26  Ht 5\' 2"  (1.575 m)  Wt 181 lb 1.6 oz (82.146 kg)  BMI 33.12 kg/m2  SpO2 100%  LMP 07/20/2013 Physical Exam  Nursing note and  vitals reviewed. Constitutional: She is oriented to person, place, and time. She appears well-developed and well-nourished. No distress.  HENT:  Head: Normocephalic and atraumatic.  Eyes: Conjunctivae are normal. Pupils are equal, round, and reactive to light.  Neck: Normal range of motion. Neck supple.  Cardiovascular: Normal rate and regular rhythm.  Exam reveals no gallop and no friction rub.   No murmur heard. Pulmonary/Chest: Accessory muscle usage (mild) present. Tachypnea noted. She is in respiratory distress. She has wheezes (diffuse). She exhibits bony tenderness.  Continuous coughing  Abdominal: Soft. She exhibits no distension. There is no tenderness.  Musculoskeletal: Normal range of motion. She exhibits no edema and no tenderness.  Neurological: She is alert and oriented to person, place, and time. She has normal strength and normal reflexes. No cranial nerve deficit or sensory deficit.  Skin: Skin is warm and dry.  Psychiatric: She has a normal mood and affect.    ED Course  Procedures (including critical care time) Labs Review Labs Reviewed  CBC WITH DIFFERENTIAL - Abnormal; Notable for the following:    Hemoglobin 10.5 (*)    HCT 32.4 (*)    MCH 25.3 (*)    RDW 17.9 (*)    All other components within normal limits  BASIC METABOLIC PANEL - Abnormal; Notable for the following:  Potassium 2.8 (*)    Glucose, Bld 207 (*)    All other components within normal limits  CULTURE, EXPECTORATED SPUTUM-ASSESSMENT  GRAM STAIN  HIV ANTIBODY (ROUTINE TESTING)  LEGIONELLA ANTIGEN, URINE  STREP PNEUMONIAE URINARY ANTIGEN  COMPREHENSIVE METABOLIC PANEL  INFLUENZA PANEL BY PCR   Imaging Review Dg Chest Portable 1 View  08/03/2013   CLINICAL DATA:  Shortness of breath, cough, wheezing  EXAM: PORTABLE CHEST - 1 VIEW  COMPARISON:  06/14/2013  FINDINGS: Lungs are clear.  No pleural effusion or pneumothorax.  Cardiomegaly.  IMPRESSION: No evidence of acute cardiopulmonary disease.    Electronically Signed   By: Charline Bills M.D.   On: 08/03/2013 21:22    EKG Interpretation    Date/Time:    Ventricular Rate:    PR Interval:    QRS Duration:   QT Interval:    QTC Calculation:   R Axis:     Text Interpretation:              MDM   1. Asthma exacerbation     2 days of fever, cough, and wheezing. Moderate respiratory distress but still able to speak in several words. No hypoxia. Symptoms consistent with asthma exacerbation. CXR not c/w pneumonia. Likely viral URI as cause of exacerbation. Given solumedrol, magnesium and placed on continuous for an hour. Marked improvement but still wheezing with tachypnea. The patient was admitted to internal medicine for continued management.    Shanon Ace, MD 08/04/13 [phone]

## 2013-08-03 NOTE — ED Notes (Signed)
Dr. Patel at bedside 

## 2013-08-03 NOTE — ED Notes (Signed)
MD at bedside. 

## 2013-08-03 NOTE — H&P (Signed)
[facility] History and Physical  Patient: Julie Kerr  ZOX:[id number]  DOB: [birth year]  DOS: the patient was seen and examined on 08/03/2013 PCP: Lu Duffel, MD  Chief Complaint: Cough and shortness of breath  HPI: Julie Kerr is a 37 y.o. female with Past medical history of asthma, anxiety. The patient is coming from home. Patient presents with complaints of cough shortness of breath and wheezing that has been ongoing since last 2 days. She also complained of fever with temperature of 102 Fahrenheit at home. She has albuterol nebulizations at home but that has not improved her symptoms. She denies any sick contact or recent travel. She mentions she has leg swelling with distention of her abdomen. She also mentions about nausea and one episode of vomiting yesterday. She denies any diarrhea or burning urination. She has reported a poor appetite since last 3 days. She denies any pain in the chest but mentions her chest is tight and gets worse when she takes a deep breath. She continues to smoke 10-15 cigarettes a day. She denies any alcohol abuse or aspiration.  Review of Systems: as mentioned in the history of present illness.  A Comprehensive review of the other systems is negative.  Past Medical History  Diagnosis Date  . Asthma   . Anxiety   . COPD (chronic obstructive pulmonary disease)   . Bronchitis    Past Surgical History  Procedure Laterality Date  . Spinal fusion     Social History:  reports that she has been smoking.  She does not have any smokeless tobacco history on file. She reports that she does not drink alcohol. Her drug history is not on file. Independent for most of her  ADL.  Allergies  Allergen Reactions  . Robitussin Dm [Dextromethorphan-Guaifenesin] Nausea And Vomiting  . Nsaids Hives  . Tramadol Hives    No family history on file.  Prior to Admission medications   Medication Sig Start Date End Date Taking? Authorizing Provider  albuterol  (PROVENTIL HFA;VENTOLIN HFA) 108 (90 BASE) MCG/ACT inhaler Inhale 2 puffs into the lungs every 4 (four) hours as needed for wheezing.   Yes Historical Provider, MD  ALPRAZolam Prudy Feeler) 1 MG tablet Take 1 mg by mouth 3 (three) times daily.   Yes Historical Provider, MD  Fluticasone-Salmeterol (ADVAIR) 250-50 MCG/DOSE AEPB Inhale 1 puff into the lungs 2 (two) times daily. 06/18/13  Yes Elease Etienne, MD  guaiFENesin (MUCINEX) 600 MG 12 hr tablet Take 1 tablet (600 mg total) by mouth 2 (two) times daily. 06/18/13  Yes Elease Etienne, MD  montelukast (SINGULAIR) 10 MG tablet Take 10 mg by mouth daily.    Yes Historical Provider, MD  morphine (MS CONTIN) 15 MG 12 hr tablet Take 15 mg by mouth every 12 (twelve) hours.   Yes Historical Provider, MD  oxyCODONE (ROXICODONE) 15 MG immediate release tablet Take 15 mg by mouth 3 (three) times daily as needed for pain (breakthrough).   Yes Historical Provider, MD  verapamil (CALAN) 120 MG tablet Take 120 mg by mouth daily.    Yes Historical Provider, MD  zolpidem (AMBIEN) 10 MG tablet Take 10 mg by mouth at bedtime.   Yes Historical Provider, MD    Physical Exam: Filed Vitals:   08/03/13 2200 08/03/13 2245 08/03/13 2252 08/03/13 2330  BP: 129/57 112/60  110/71  Pulse: 136 113  119  Temp:   98.8 F (37.1 C)   TempSrc:   Rectal   Resp: 29 18  28  SpO2: 100% 100%  100%    General: Alert, Awake and Oriented to Time, Place and Person. Appear in severe distress Eyes: PERRL ENT: Oral Mucosa clear moist Neck: No JVD Cardiovascular: S1 and S2 Present, no Murmur, Peripheral Pulses Present Respiratory: Bilateral Air entry equal and Decreased, basal Crackles, extensive wheezes Abdomen: Bowel Sound Present, Soft and diffuse tender no guarding no rigidity Skin: No Rash Extremities: Trace Pedal edema, no calf tenderness Neurologic: Grossly Unremarkable.  Labs on Admission:  CBC:  Recent Labs Lab 08/03/13 2150  WBC 8.4  NEUTROABS 4.7  HGB 10.5*  HCT  32.4*  MCV 78.1  PLT 270    CMP     Component Value Date/Time   NA 138 08/03/2013 2150   K 2.8* 08/03/2013 2150   CL 106 08/03/2013 2150   CO2 19 08/03/2013 2150   GLUCOSE 207* 08/03/2013 2150   BUN 7 08/03/2013 2150   CREATININE 0.66 08/03/2013 2150   CALCIUM 8.5 08/03/2013 2150   PROT 6.8 06/15/2013 0346   ALBUMIN 3.3* 06/15/2013 0346   AST 13 06/15/2013 0346   ALT 11 06/15/2013 0346   ALKPHOS 55 06/15/2013 0346   BILITOT 0.2* 06/15/2013 0346   GFRNONAA >90 08/03/2013 2150   GFRAA >90 08/03/2013 2150    No results found for this basename: LIPASE, AMYLASE,  in the last 168 hours No results found for this basename: AMMONIA,  in the last 168 hours  No results found for this basename: CKTOTAL, CKMB, CKMBINDEX, TROPONINI,  in the last 168 hours BNP (last 3 results)  Recent Labs  06/14/13 1610  PROBNP 26.1    Radiological Exams on Admission: Dg Chest Portable 1 View  08/03/2013   CLINICAL DATA:  Shortness of breath, cough, wheezing  EXAM: PORTABLE CHEST - 1 VIEW  COMPARISON:  06/14/2013  FINDINGS: Lungs are clear.  No pleural effusion or pneumothorax.  Cardiomegaly.  IMPRESSION: No evidence of acute cardiopulmonary disease.   Electronically Signed   By: Charline Bills M.D.   On: 08/03/2013 21:22    EKG: Independently reviewed. sinus tachycardia.  Assessment/Plan Principal Problem:   Asthma exacerbation Active Problems:   Chronic pain syndrome   1. Asthma exacerbation The patient presents with complaints of cough and shortness of breath, she appears in significant respiratory distress with tachycardia and tachypnea and excessive cough. She has been on continuous albuterol nebulization which has improved her wheezing some. She will be admitted to a telemetry bed, IV levofloxacin, IV supplemental 60 mg every 6 hours, Mucinex, Advair, duo nebs every 4 hours, albuterol every 2 hours as needed will be given Sputum cultures and urine antigens were ordered Since her symptoms  have been started since last 2 days I will empirically treat her with Tamiflu and obtain influenza antigen as well For her significant cough I will give her lozenges and Tessalon Perles  2. Chronic pain syndrome The patient has been on MS Contin 15 mg twice a day which I would continue I would also continue oral oxycodone IR as needed  DVT Prophylaxis: subcutaneous Heparin Nutrition: Regular diet advance as tolerated  Code Status: Full  Disposition: Admitted to inpatient in telemetry unit.  Author: Lynden Oxford, MD [facility] Pager: [phone] 08/03/2013, 11:57 PM    If 7PM-7AM, please contact night-coverage [email or web address] Password [email or web address]

## 2013-08-03 NOTE — ED Notes (Signed)
Pt. reports progressing SOB with productive cough / chest congestion onset 2 days ago . Denies fever or chills. Pt. presents with wheezing / stridor .

## 2013-08-04 ENCOUNTER — Encounter (HOSPITAL_COMMUNITY): Payer: Self-pay | Admitting: *Deleted

## 2013-08-04 DIAGNOSIS — D649 Anemia, unspecified: Secondary | ICD-10-CM

## 2013-08-04 DIAGNOSIS — A419 Sepsis, unspecified organism: Principal | ICD-10-CM

## 2013-08-04 DIAGNOSIS — J441 Chronic obstructive pulmonary disease with (acute) exacerbation: Secondary | ICD-10-CM

## 2013-08-04 LAB — CBC WITH DIFFERENTIAL/PLATELET
Basophils Absolute: 0 10*3/uL (ref 0.0–0.1)
Basophils Relative: 0 % (ref 0–1)
Eosinophils Absolute: 0 10*3/uL (ref 0.0–0.7)
Eosinophils Relative: 0 % (ref 0–5)
HCT: 31.4 % — ABNORMAL LOW (ref 36.0–46.0)
MCH: 25.3 pg — ABNORMAL LOW (ref 26.0–34.0)
MCHC: 32.5 g/dL (ref 30.0–36.0)
MCV: 77.9 fL — ABNORMAL LOW (ref 78.0–100.0)
Monocytes Absolute: 0.1 10*3/uL (ref 0.1–1.0)
RDW: 17.8 % — ABNORMAL HIGH (ref 11.5–15.5)

## 2013-08-04 LAB — INFLUENZA PANEL BY PCR (TYPE A & B)
H1N1 flu by pcr: NOT DETECTED
Influenza A By PCR: NEGATIVE

## 2013-08-04 LAB — GLUCOSE, CAPILLARY
Glucose-Capillary: 166 mg/dL — ABNORMAL HIGH (ref 70–99)
Glucose-Capillary: 216 mg/dL — ABNORMAL HIGH (ref 70–99)
Glucose-Capillary: 224 mg/dL — ABNORMAL HIGH (ref 70–99)

## 2013-08-04 LAB — COMPREHENSIVE METABOLIC PANEL
ALT: 9 U/L (ref 0–35)
AST: 12 U/L (ref 0–37)
Alkaline Phosphatase: 51 U/L (ref 39–117)
BUN: 7 mg/dL (ref 6–23)
CO2: 14 mEq/L — ABNORMAL LOW (ref 19–32)
Calcium: 9.4 mg/dL (ref 8.4–10.5)
Chloride: 100 mEq/L (ref 96–112)
GFR calc Af Amer: >90 mL/min (ref >90)
GFR calc non Af Amer: >90 mL/min (ref >90)
Glucose, Bld: 255 mg/dL — ABNORMAL HIGH (ref 70–99)
Potassium: 3.4 mEq/L — ABNORMAL LOW (ref 3.5–5.1)
Sodium: 137 mEq/L (ref 135–145)
Total Bilirubin: 0.2 mg/dL — ABNORMAL LOW (ref 0.3–1.2)

## 2013-08-04 MED ORDER — ALBUTEROL SULFATE (5 MG/ML) 0.5% IN NEBU: 2.5000 mg | INHALATION_SOLUTION | RESPIRATORY_TRACT | Status: DC | PRN

## 2013-08-04 MED ORDER — HYDROMORPHONE HCL PF 1 MG/ML IJ SOLN
0.5000 mg | INTRAMUSCULAR | Status: DC | PRN
  Administered 2013-08-04: 02:00:00 via INTRAVENOUS
  Administered 2013-08-04 – 2013-08-06 (×12): 0.5 mg via INTRAVENOUS
  Filled 2013-08-04 (×13): qty 1

## 2013-08-04 MED ORDER — IPRATROPIUM BROMIDE 0.02 % IN SOLN
0.5000 mg | RESPIRATORY_TRACT | Status: DC
  Administered 2013-08-04 (×5): 0.5 mg via RESPIRATORY_TRACT
  Filled 2013-08-04 (×6): qty 2.5

## 2013-08-04 MED ORDER — LEVOFLOXACIN IN D5W 750 MG/150ML IV SOLN
750.0000 mg | INTRAVENOUS | Status: DC
  Administered 2013-08-04 – 2013-08-06 (×3): 750 mg via INTRAVENOUS
  Filled 2013-08-04 (×3): qty 150

## 2013-08-04 MED ORDER — MORPHINE SULFATE ER 15 MG PO TBCR
15.0000 mg | EXTENDED_RELEASE_TABLET | Freq: Two times a day (BID) | ORAL | Status: DC
  Administered 2013-08-04 – 2013-08-06 (×6): 15 mg via ORAL
  Filled 2013-08-04 (×6): qty 1

## 2013-08-04 MED ORDER — OXYCODONE HCL 5 MG PO TABS
15.0000 mg | ORAL_TABLET | Freq: Three times a day (TID) | ORAL | Status: DC | PRN
  Administered 2013-08-04 – 2013-08-06 (×11): 15 mg via ORAL
  Filled 2013-08-04 (×11): qty 3

## 2013-08-04 MED ORDER — GUAIFENESIN ER 600 MG PO TB12
1200.0000 mg | ORAL_TABLET | Freq: Two times a day (BID) | ORAL | Status: DC
  Administered 2013-08-04 – 2013-08-06 (×6): 1200 mg via ORAL
  Filled 2013-08-04 (×7): qty 2

## 2013-08-04 MED ORDER — ALBUTEROL SULFATE (5 MG/ML) 0.5% IN NEBU
2.5000 mg | INHALATION_SOLUTION | RESPIRATORY_TRACT | Status: DC | PRN
  Administered 2013-08-05 (×2): 2.5 mg via RESPIRATORY_TRACT
  Filled 2013-08-04 (×2): qty 0.5

## 2013-08-04 MED ORDER — ALBUTEROL SULFATE (5 MG/ML) 0.5% IN NEBU
2.5000 mg | INHALATION_SOLUTION | RESPIRATORY_TRACT | Status: DC
  Administered 2013-08-04 (×5): 2.5 mg via RESPIRATORY_TRACT
  Filled 2013-08-04 (×6): qty 0.5

## 2013-08-04 MED ORDER — INSULIN ASPART 100 UNIT/ML ~~LOC~~ SOLN
0.0000 [IU] | Freq: Three times a day (TID) | SUBCUTANEOUS | Status: DC
  Administered 2013-08-04: 2 [IU] via SUBCUTANEOUS
  Administered 2013-08-04: 3 [IU] via SUBCUTANEOUS
  Administered 2013-08-05 (×2): 2 [IU] via SUBCUTANEOUS
  Administered 2013-08-05: 3 [IU] via SUBCUTANEOUS
  Administered 2013-08-06: 2 [IU] via SUBCUTANEOUS

## 2013-08-04 MED ORDER — METHYLPREDNISOLONE SODIUM SUCC 125 MG IJ SOLR
60.0000 mg | Freq: Four times a day (QID) | INTRAMUSCULAR | Status: DC
  Administered 2013-08-04 (×2): 60 mg via INTRAVENOUS
  Administered 2013-08-04: 06:00:00 via INTRAVENOUS
  Administered 2013-08-04 – 2013-08-05 (×3): 60 mg via INTRAVENOUS
  Filled 2013-08-04 (×7): qty 0.96
  Filled 2013-08-04: qty 2
  Filled 2013-08-04 (×2): qty 0.96

## 2013-08-04 MED ORDER — IPRATROPIUM BROMIDE 0.02 % IN SOLN
0.5000 mg | Freq: Four times a day (QID) | RESPIRATORY_TRACT | Status: DC
  Administered 2013-08-05 – 2013-08-06 (×5): 0.5 mg via RESPIRATORY_TRACT
  Filled 2013-08-04 (×5): qty 2.5

## 2013-08-04 MED ORDER — MENTHOL 3 MG MT LOZG
1.0000 | LOZENGE | OROMUCOSAL | Status: DC | PRN
  Administered 2013-08-04: 3 mg via ORAL
  Filled 2013-08-04: qty 9

## 2013-08-04 MED ORDER — OSELTAMIVIR PHOSPHATE 75 MG PO CAPS
75.0000 mg | ORAL_CAPSULE | Freq: Every day | ORAL | Status: DC
  Filled 2013-08-04 (×2): qty 1

## 2013-08-04 MED ORDER — POTASSIUM CHLORIDE IN NACL 20-0.9 MEQ/L-% IV SOLN
INTRAVENOUS | Status: DC
  Administered 2013-08-04 – 2013-08-05 (×3): via INTRAVENOUS
  Filled 2013-08-04 (×8): qty 1000

## 2013-08-04 MED ORDER — MONTELUKAST SODIUM 10 MG PO TABS
10.0000 mg | ORAL_TABLET | Freq: Every day | ORAL | Status: DC
  Administered 2013-08-04 – 2013-08-06 (×3): 10 mg via ORAL
  Filled 2013-08-04 (×3): qty 1

## 2013-08-04 MED ORDER — MOMETASONE FURO-FORMOTEROL FUM 100-5 MCG/ACT IN AERO
2.0000 | INHALATION_SPRAY | Freq: Two times a day (BID) | RESPIRATORY_TRACT | Status: DC
  Administered 2013-08-05 – 2013-08-06 (×2): 2 via RESPIRATORY_TRACT
  Filled 2013-08-04 (×2): qty 8.8

## 2013-08-04 MED ORDER — INSULIN ASPART 100 UNIT/ML ~~LOC~~ SOLN
0.0000 [IU] | Freq: Every day | SUBCUTANEOUS | Status: DC
  Administered 2013-08-04: 2 [IU] via SUBCUTANEOUS

## 2013-08-04 MED ORDER — ENOXAPARIN SODIUM 40 MG/0.4ML ~~LOC~~ SOLN
40.0000 mg | SUBCUTANEOUS | Status: DC
  Administered 2013-08-04 – 2013-08-06 (×3): 40 mg via SUBCUTANEOUS
  Filled 2013-08-04 (×3): qty 0.4

## 2013-08-04 MED ORDER — BENZONATATE 100 MG PO CAPS
200.0000 mg | ORAL_CAPSULE | Freq: Three times a day (TID) | ORAL | Status: DC | PRN
  Administered 2013-08-04 – 2013-08-06 (×6): 200 mg via ORAL
  Filled 2013-08-04 (×8): qty 2

## 2013-08-04 MED ORDER — ALBUTEROL SULFATE (5 MG/ML) 0.5% IN NEBU
2.5000 mg | INHALATION_SOLUTION | Freq: Four times a day (QID) | RESPIRATORY_TRACT | Status: DC
  Administered 2013-08-05 – 2013-08-06 (×5): 2.5 mg via RESPIRATORY_TRACT
  Filled 2013-08-04 (×5): qty 0.5

## 2013-08-04 NOTE — ED Provider Notes (Signed)
I have personally seen and examined the patient.  I have discussed the plan of care with the resident.  I have reviewed the documentation on PMH/FH/Soc. History.  I have reviewed the documentation of the resident and agree.  Doug Sou, MD 08/04/13 [phone]

## 2013-08-04 NOTE — Progress Notes (Signed)
[facility]                                                                                Patient Demographics  Julie Kerr, is a 37 y.o. female, DOB - 1975-11-26, GEX:[id number]  Admit date - 08/03/2013   Admitting Physician Lynden Oxford, MD  Outpatient Primary MD for the patient is Lu Duffel, MD  LOS - 1   Chief Complaint  Patient presents with  . Shortness of Breath  . Cough        Assessment & Plan    Principal Problem:   Asthma exacerbation Active Problems:   Chronic pain syndrome  Sepsis secondary to upper respiratory infection -Patient is currently tachypneic and does now have leukocytosis of 18.2. -Chest x-ray was negative for infiltrate. -Pending influenza PCR, Legionella and strep pneumonia urine antigens, sputum culture and Gram stain -Will continue on Tamiflu as well as Levaquin  Asthma exacerbation -likely secondary to upper respiratory infection versus influenza versus smoking  -Will continue nebulizer treatments, guaifenesin, Solu-Medrol  Chronic pain syndrome -Will continue MS Contin   Hyperglycemia with no history of diabetes -Likely secondary to Solu-Medrol an acute phase reactant -Will continue to monitor  Anemia -Appears stable, hemoglobin currently 10.2 and at baseline. -No evidence of bleeding, will continue to monitor CBC  Tobacco abuse -Will order cessation counseling   Code Status: Full  Family Communication: None at bedside.  Disposition Plan: Admitted, will discharge once medically stable.   Procedures none  Consults  none  DVT Prophylaxis  heparin  Lab Results  Component Value Date   PLT 282 08/04/2013    Medications  Scheduled Meds: . ipratropium  0.5 mg Nebulization Q4H   And  . albuterol  2.5 mg Nebulization Q4H  . enoxaparin (LOVENOX) injection  40 mg Subcutaneous Q24H  . guaiFENesin  1,200 mg Oral BID  . levofloxacin (LEVAQUIN) IV  750 mg Intravenous Q24H  . methylPREDNISolone (SOLU-MEDROL)  injection  60 mg Intravenous Q6H  . mometasone-formoterol  2 puff Inhalation BID  . montelukast  10 mg Oral Daily  . morphine  15 mg Oral Q12H  . oseltamivir  75 mg Oral Daily   Continuous Infusions: . 0.9 % NaCl with KCl 20 mEq / L 100 mL/hr at 08/04/13 0149  . albuterol Stopped (08/03/13 2155)   PRN Meds:.albuterol, benzonatate, HYDROmorphone (DILAUDID) injection, menthol-cetylpyridinium, oxyCODONE  Antibiotics   Anti-infectives   Start     Dose/Rate Route Frequency Ordered Stop   08/04/13 1000  oseltamivir (TAMIFLU) capsule 75 mg     75 mg Oral Daily 08/04/13 0029 08/09/13 0959   08/04/13 0030  levofloxacin (LEVAQUIN) IVPB 750 mg     750 mg 100 mL/hr over 90 Minutes Intravenous Every 24 hours 08/04/13 0026 08/08/13 2359     Time Spent in minutes   30 minutes   Marinus Eicher D.O. on 08/04/2013 at 11:31 AM  Between 7am to 7pm - Pager - [phone]  After 7pm go to [email or web address] - password [email or web address]  And look for the night coverage person covering for me after hours  [facility] Office  [phone]    Subjective:   Julie Kerr seen and examined today.  Patient continues to feel short of breath and having dry cough. She denies sick contacts at home, she does have small children at home.  Patient continues to complain of poor appetite. She does admit to continued smoking.  Objective:   Filed Vitals:   08/04/13 0058 08/04/13 0414 08/04/13 0426 08/04/13 0749  BP:   108/55 116/60  Pulse:   106 98  Temp:   99.1 F (37.3 C) 98.9 F (37.2 C)  TempSrc:   Oral Oral  Resp:   24 24  Height:      Weight:      SpO2: 100% 100% 99% 94%    Wt Readings from Last 3 Encounters:  08/04/13 82.146 kg (181 lb 1.6 oz)  06/17/13 84.6 kg (186 lb 8.2 oz)  05/04/13 84.369 kg (186 lb)     Intake/Output Summary (Last 24 hours) at 08/04/13 1131 Last data filed at 08/04/13 0900  Gross per 24 hour  Intake    960 ml  Output      0 ml  Net    960 ml     Exam  General: Well developed, well nourished, moderate distress, appears stated age  HEENT: NCAT, PERRLA, EOMI, Anicteic Sclera, mucous membranes moist. No pharyngeal erythema or exudates  Neck: Supple, no JVD, no masses  Cardiovascular: S1 S2 auscultated, no rubs, murmurs or gallops. Regular rate and rhythm.  Respiratory: Decreased breath sounds noted, extensive expiratory wheezing noted, crackles in the left lower lung fields bilaterally.  Abdomen: Soft, nontender, nondistended, + bowel sounds  Extremities: warm dry without cyanosis clubbing. Trace lower extremity edema  Neuro: AAOx3, cranial nerves grossly intact. Strength 5/5 in patient's upper and lower extremities bilaterally  Skin: Without rashes exudates or nodules  Psych: Normal affect and demeanor with intact judgement and insight  Data Review   Micro Results No results found for this or any previous visit (from the past 240 hour(s)).  Radiology Reports Dg Chest Portable 1 View  08/03/2013   CLINICAL DATA:  Shortness of breath, cough, wheezing  EXAM: PORTABLE CHEST - 1 VIEW  COMPARISON:  06/14/2013  FINDINGS: Lungs are clear.  No pleural effusion or pneumothorax.  Cardiomegaly.  IMPRESSION: No evidence of acute cardiopulmonary disease.   Electronically Signed   By: Charline Bills M.D.   On: 08/03/2013 21:22    CBC  Recent Labs Lab 08/03/13 2150 08/04/13 0530  WBC 8.4 18.2*  HGB 10.5* 10.2*  HCT 32.4* 31.4*  PLT 270 282  MCV 78.1 77.9*  MCH 25.3* 25.3*  MCHC 32.4 32.5  RDW 17.9* 17.8*  LYMPHSABS 3.4 0.6*  MONOABS 0.3 0.1  EOSABS 0.0 0.0  BASOSABS 0.0 0.0    Chemistries   Recent Labs Lab 08/03/13 2150 08/04/13 0530  NA 138 137  K 2.8* 3.4*  CL 106 100  CO2 19 14*  GLUCOSE 207* 255*  BUN 7 7  CREATININE 0.66 0.71  CALCIUM 8.5 9.4  AST  --  12  ALT  --  9  ALKPHOS  --  51  BILITOT  --  0.2*    ------------------------------------------------------------------------------------------------------------------ estimated creatinine clearance is 97.5 ml/min (by C-G formula based on Cr of 0.71). ------------------------------------------------------------------------------------------------------------------ No results found for this basename: HGBA1C,  in the last 72 hours ------------------------------------------------------------------------------------------------------------------ No results found for this basename: CHOL, HDL, LDLCALC, TRIG, CHOLHDL, LDLDIRECT,  in the last 72 hours ------------------------------------------------------------------------------------------------------------------ No results found for this basename: TSH, T4TOTAL, FREET3, T3FREE, THYROIDAB,  in the last 72 hours ------------------------------------------------------------------------------------------------------------------ No results found for  this basename: VITAMINB12, FOLATE, FERRITIN, TIBC, IRON, RETICCTPCT,  in the last 72 hours  Coagulation profile No results found for this basename: INR, PROTIME,  in the last 168 hours  No results found for this basename: DDIMER,  in the last 72 hours  Cardiac Enzymes No results found for this basename: CK, CKMB, TROPONINI, MYOGLOBIN,  in the last 168 hours ------------------------------------------------------------------------------------------------------------------ No components found with this basename: POCBNP,

## 2013-08-05 LAB — CBC
Hemoglobin: 10.1 g/dL — ABNORMAL LOW (ref 12.0–15.0)
MCH: 25.5 pg — ABNORMAL LOW (ref 26.0–34.0)
Platelets: 320 10*3/uL (ref 150–400)
RBC: 3.96 MIL/uL (ref 3.87–5.11)
WBC: 32.6 10*3/uL — ABNORMAL HIGH (ref 4.0–10.5)

## 2013-08-05 LAB — BASIC METABOLIC PANEL
CO2: 18 mEq/L — ABNORMAL LOW (ref 19–32)
Calcium: 9.4 mg/dL (ref 8.4–10.5)
Chloride: 103 mEq/L (ref 96–112)
GFR calc non Af Amer: >90 mL/min (ref >90)
Potassium: 4.3 mEq/L (ref 3.5–5.1)
Sodium: 135 mEq/L (ref 135–145)

## 2013-08-05 LAB — GLUCOSE, CAPILLARY
Glucose-Capillary: 164 mg/dL — ABNORMAL HIGH (ref 70–99)
Glucose-Capillary: 134 mg/dL — ABNORMAL HIGH (ref 70–99)

## 2013-08-05 LAB — LEGIONELLA ANTIGEN, URINE

## 2013-08-05 MED ORDER — WHITE PETROLATUM GEL
Status: AC
  Filled 2013-08-05: qty 5

## 2013-08-05 MED ORDER — DOCUSATE SODIUM 100 MG PO CAPS
100.0000 mg | ORAL_CAPSULE | Freq: Every day | ORAL | Status: DC | PRN
  Administered 2013-08-05: 100 mg via ORAL
  Filled 2013-08-05: qty 1

## 2013-08-05 MED ORDER — ENSURE COMPLETE PO LIQD
237.0000 mL | Freq: Two times a day (BID) | ORAL | Status: DC
  Administered 2013-08-05 – 2013-08-06 (×2): 237 mL via ORAL

## 2013-08-05 MED ORDER — METHYLPREDNISOLONE SODIUM SUCC 40 MG IJ SOLR
40.0000 mg | Freq: Two times a day (BID) | INTRAMUSCULAR | Status: DC
  Administered 2013-08-05 – 2013-08-06 (×2): 40 mg via INTRAVENOUS
  Filled 2013-08-05 (×3): qty 1

## 2013-08-05 NOTE — Progress Notes (Signed)
[facility]                                                                                Patient Demographics  Julie Kerr, is a 37 y.o. female, DOB - 1976/01/07, ZOX:[id number]  Admit date - 08/03/2013   Admitting Physician Lynden Oxford, MD  Outpatient Primary MD for the patient is Lu Duffel, MD  LOS - 2   Chief Complaint  Patient presents with  . Shortness of Breath  . Cough        Assessment & Plan    Principal Problem:   Asthma exacerbation Active Problems:   Tobacco use disorder   Chronic pain syndrome   Sepsis   Anemia  Sepsis secondary to upper respiratory infection -Patient is currently tachypneic and does now have leukocytosis of 32.6 -Chest x-ray was negative for infiltrate. -Influenza negative, strep pneumonia urine antigen negative -Pending sputum culture and Gram stain -Discontinued, will continue Levaquin -Leukocytosis likely acute phase reactant with Solu-Medrol.  Asthma exacerbation -likely secondary to upper respiratory infection versus smoking  -Will continue nebulizer treatments, guaifenesin, Solu-Medrol  Chronic pain syndrome -Will continue MS Contin   Hyperglycemia with no history of diabetes -Likely secondary to Solu-Medrol an acute phase reactant -Will continue to monitor  Anemia -Appears stable, hemoglobin currently 10.1 and at baseline. -No evidence of bleeding, will continue to monitor CBC  Tobacco abuse -Will order cessation counseling   Code Status: Full  Family Communication: None at bedside.  Disposition Plan: Admitted, will discharge once medically stable.   Procedures none  Consults  none  DVT Prophylaxis  heparin  Lab Results  Component Value Date   PLT 320 08/05/2013    Medications  Scheduled Meds: . ipratropium  0.5 mg Nebulization QID   And  . albuterol  2.5 mg Nebulization QID  . enoxaparin (LOVENOX) injection  40 mg Subcutaneous Q24H  . guaiFENesin  1,200 mg Oral BID  . insulin  aspart  0-5 Units Subcutaneous QHS  . insulin aspart  0-9 Units Subcutaneous TID WC  . levofloxacin (LEVAQUIN) IV  750 mg Intravenous Q24H  . methylPREDNISolone (SOLU-MEDROL) injection  60 mg Intravenous Q6H  . mometasone-formoterol  2 puff Inhalation BID  . montelukast  10 mg Oral Daily  . morphine  15 mg Oral Q12H  . white petrolatum       Continuous Infusions: . 0.9 % NaCl with KCl 20 mEq / L 100 mL/hr at 08/04/13 1317  . albuterol Stopped (08/03/13 2155)   PRN Meds:.albuterol, benzonatate, docusate sodium, HYDROmorphone (DILAUDID) injection, menthol-cetylpyridinium, oxyCODONE  Antibiotics   Anti-infectives   Start     Dose/Rate Route Frequency Ordered Stop   08/04/13 1000  oseltamivir (TAMIFLU) capsule 75 mg  Status:  Discontinued     75 mg Oral Daily 08/04/13 0029 08/04/13 1140   08/04/13 0030  levofloxacin (LEVAQUIN) IVPB 750 mg     750 mg 100 mL/hr over 90 Minutes Intravenous Every 24 hours 08/04/13 0026 08/08/13 2359     Time Spent in minutes   25 minutes   Adolphus Hanf D.O. on 08/05/2013 at 10:45 AM  Between 7am to 7pm - Pager - [phone]  After 7pm go to [email or web address] -  password [email or web address]  And look for the night coverage person covering for me after hours  [facility] Office  [phone]    Subjective:   Shalan Neault seen and examined today.  Patient continues to feel lots of back pain and rib pain and leg pain. She states that her MS Contin needs to be increased. Patient continues to have some shortness of breath as well as dry cough. Patient does not feel that her breathing has improved.  Objective:   Filed Vitals:   08/04/13 2103 08/05/13 0442 08/05/13 0754 08/05/13 0849  BP:  100/61 123/74   Pulse:  87 87 90  Temp:  99.8 F (37.7 C) 98.8 F (37.1 C)   TempSrc:  Oral Oral   Resp:  18 18 18   Height:      Weight:      SpO2: 97% 97% 97% 97%    Wt Readings from Last 3 Encounters:  08/04/13 82.146 kg (181 lb 1.6 oz)  06/17/13 84.6  kg (186 lb 8.2 oz)  05/04/13 84.369 kg (186 lb)     Intake/Output Summary (Last 24 hours) at 08/05/13 1045 Last data filed at 08/05/13 0840  Gross per 24 hour  Intake   1560 ml  Output      0 ml  Net   1560 ml    Exam  General: Well developed, well nourished, mild distress, appears stated age  HEENT: NCAT, PERRLA, mucous membranes moist.   Neck: Supple, no JVD, no masses  Cardiovascular: S1 S2 auscultated, no rubs, murmurs or gallops. Regular rate and rhythm.  Respiratory: Decreased breath sounds noted, extensive expiratory wheezing noted, crackles in the lower lung fields bilaterally.  Abdomen: Soft, nontender, nondistended, + bowel sounds  Extremities: warm dry without cyanosis clubbing. Trace lower extremity edema  Neuro: AAOx3, cranial nerves grossly intact.   Skin: Without rashes exudates or nodules  Psych: Normal affect and demeanor with intact judgement and insight  Data Review   Micro Results No results found for this or any previous visit (from the past 240 hour(s)).  Radiology Reports Dg Chest Portable 1 View  08/03/2013   CLINICAL DATA:  Shortness of breath, cough, wheezing  EXAM: PORTABLE CHEST - 1 VIEW  COMPARISON:  06/14/2013  FINDINGS: Lungs are clear.  No pleural effusion or pneumothorax.  Cardiomegaly.  IMPRESSION: No evidence of acute cardiopulmonary disease.   Electronically Signed   By: Charline Bills M.D.   On: 08/03/2013 21:22    CBC  Recent Labs Lab 08/03/13 2150 08/04/13 0530 08/05/13 0746  WBC 8.4 18.2* 32.6*  HGB 10.5* 10.2* 10.1*  HCT 32.4* 31.4* 30.8*  PLT 270 282 320  MCV 78.1 77.9* 77.8*  MCH 25.3* 25.3* 25.5*  MCHC 32.4 32.5 32.8  RDW 17.9* 17.8* 18.2*  LYMPHSABS 3.4 0.6*  --   MONOABS 0.3 0.1  --   EOSABS 0.0 0.0  --   BASOSABS 0.0 0.0  --     Chemistries   Recent Labs Lab 08/03/13 2150 08/04/13 0530 08/05/13 0746  NA 138 137 135  K 2.8* 3.4* 4.3  CL 106 100 103  CO2 19 14* 18*  GLUCOSE 207* 255* 149*   BUN 7 7 8   CREATININE 0.66 0.71 0.63  CALCIUM 8.5 9.4 9.4  AST  --  12  --   ALT  --  9  --   ALKPHOS  --  51  --   BILITOT  --  0.2*  --    ------------------------------------------------------------------------------------------------------------------ estimated  creatinine clearance is 97.5 ml/min (by C-G formula based on Cr of 0.63). ------------------------------------------------------------------------------------------------------------------  Recent Labs  08/04/13 0530  HGBA1C 5.6   ------------------------------------------------------------------------------------------------------------------ No results found for this basename: CHOL, HDL, LDLCALC, TRIG, CHOLHDL, LDLDIRECT,  in the last 72 hours ------------------------------------------------------------------------------------------------------------------ No results found for this basename: TSH, T4TOTAL, FREET3, T3FREE, THYROIDAB,  in the last 72 hours ------------------------------------------------------------------------------------------------------------------ No results found for this basename: VITAMINB12, FOLATE, FERRITIN, TIBC, IRON, RETICCTPCT,  in the last 72 hours  Coagulation profile No results found for this basename: INR, PROTIME,  in the last 168 hours  No results found for this basename: DDIMER,  in the last 72 hours  Cardiac Enzymes No results found for this basename: CK, CKMB, TROPONINI, MYOGLOBIN,  in the last 168 hours ------------------------------------------------------------------------------------------------------------------ No components found with this basename: POCBNP,

## 2013-08-05 NOTE — Progress Notes (Addendum)
INITIAL NUTRITION ASSESSMENT  DOCUMENTATION CODES Per approved criteria  -Severe malnutrition in the context of chronic illness -Obesity Unspecified   INTERVENTION: Ensure Complete po BID, each supplement provides 350 kcal and 13 grams of protein  NUTRITION DIAGNOSIS: Malnutrition related to chronic illness as evidenced by 9% weight loss x 4 months and intake of </= 75% of her needs in >/= 1 month.   Goal: Pt to meet >/= 90% of their estimated nutrition needs   Monitor:  PO intake, weight trend, labs  Reason for Assessment: Pt identified as at nutrition risk on the Malnutrition Screen Tool  37 y.o. female  Admitting Dx: Asthma exacerbation  ASSESSMENT: Pt admitted with cough and SOB. Pt with hx of asthma now with asthma exacerbation and sepsis likely due to upper respiratory infection. Pt continues to smoke.  Pt with 3 prior episodes of VDRF.  Pt reports a lot of weight loss over the last few months due to stress. Pt states she was a size 14 and now is a size 9. Pt eats three meals per day but they are small. Breakfast is an egg with a few crackers, lunch is a salad of lettuce with a small amount of chicken. Dinner is a one drumette with a few bites of corn. Per pt she will sometimes go a few days without eating due to poor appetite. Pt lives with 2 children.   Pt has never tried ensure but is willing to try now. Discussed with pt importance of weight stabilization.  Potassium being replaced. Pt with elevated blood sugars. No hx of DM but is currently on steroids.   Nutrition Focused Physical Exam:  Subcutaneous Fat:  Orbital Region: WNL Upper Arm Region: WNL Thoracic and Lumbar Region: WNL  Muscle:  Temple Region: WNL Clavicle Bone Region: WNL Clavicle and Acromion Bone Region: WNl Scapular Bone Region: WNl Dorsal Hand: WNL Patellar Region: WNL Anterior Thigh Region: WNL Posterior Calf Region: WNL  Edema: not present   Height: Ht Readings from Last 1 Encounters:   08/04/13 5\' 2"  (1.575 m)    Weight: Wt Readings from Last 1 Encounters:  08/04/13 181 lb 1.6 oz (82.146 kg)    Ideal Body Weight: 50 kg   % Ideal Body Weight: 164%  Wt Readings from Last 10 Encounters:  08/04/13 181 lb 1.6 oz (82.146 kg)  06/17/13 186 lb 8.2 oz (84.6 kg)  05/04/13 186 lb (84.369 kg)  04/07/13 198 lb (89.812 kg)    Usual Body Weight: 198 lb   % Usual Body Weight: 91%  BMI:  Body mass index is 33.12 kg/(m^2).  Estimated Nutritional Needs: Kcal: 1700-1900 Protein: 85-95 grams Fluid: > 1.7 L/day  Skin: no issues noted  Diet Order: Carb Control  EDUCATION NEEDS: -No education needs identified at this time   Intake/Output Summary (Last 24 hours) at 08/05/13 1348 Last data filed at 08/05/13 0840  Gross per 24 hour  Intake   1560 ml  Output      0 ml  Net   1560 ml    Last BM: PTA   Labs:   Recent Labs Lab 08/03/13 2150 08/04/13 0530 08/05/13 0746  NA 138 137 135  K 2.8* 3.4* 4.3  CL 106 100 103  CO2 19 14* 18*  BUN 7 7 8   CREATININE 0.66 0.71 0.63  CALCIUM 8.5 9.4 9.4  GLUCOSE 207* 255* 149*    CBG (last 3)   Recent Labs  08/04/13 2044 08/05/13 0751 08/05/13 1139  GLUCAP 224*  157* 164*    Scheduled Meds: . ipratropium  0.5 mg Nebulization QID   And  . albuterol  2.5 mg Nebulization QID  . enoxaparin (LOVENOX) injection  40 mg Subcutaneous Q24H  . guaiFENesin  1,200 mg Oral BID  . insulin aspart  0-5 Units Subcutaneous QHS  . insulin aspart  0-9 Units Subcutaneous TID WC  . levofloxacin (LEVAQUIN) IV  750 mg Intravenous Q24H  . methylPREDNISolone (SOLU-MEDROL) injection  40 mg Intravenous Q12H  . mometasone-formoterol  2 puff Inhalation BID  . montelukast  10 mg Oral Daily  . morphine  15 mg Oral Q12H  . white petrolatum        Continuous Infusions: . 0.9 % NaCl with KCl 20 mEq / L 100 mL/hr at 08/04/13 1317  . albuterol Stopped (08/03/13 2155)    Past Medical History  Diagnosis Date  . Asthma   . Anxiety    . COPD (chronic obstructive pulmonary disease)   . Bronchitis     Past Surgical History  Procedure Laterality Date  . Spinal fusion      Kendell Bane RD, LDN, CNSC [phone] Pager [phone] After Hours Pager

## 2013-08-05 NOTE — Progress Notes (Signed)
08/05/13 0442  Aerosol Therapy Tx  Medications Albuterol  Delivery Source Air  Delivery Device HHN  Pre-Treatment Pulse 75  Pre-Treatment Respirations 17  Treatment Tolerance Tolerated well  RT Breath Sounds  Bilateral Breath Sounds Expiratory wheezes  Oxygen Therapy/Pulse Ox  O2 Device Nasal cannula  O2 Therapy Oxygen  O2 Flow Rate (L/min) 2 L/min  SpO2 98 %  Gave additional 2.5mg  albuterol treatment for persistent wheezing and shortness of breath.

## 2013-08-06 LAB — GLUCOSE, CAPILLARY: Glucose-Capillary: 151 mg/dL — ABNORMAL HIGH (ref 70–99)

## 2013-08-06 LAB — CBC
HCT: 33.5 % — ABNORMAL LOW (ref 36.0–46.0)
Hemoglobin: 10.9 g/dL — ABNORMAL LOW (ref 12.0–15.0)
MCHC: 32.5 g/dL (ref 30.0–36.0)
Platelets: 358 10*3/uL (ref 150–400)
RDW: 18.3 % — ABNORMAL HIGH (ref 11.5–15.5)

## 2013-08-06 MED ORDER — ALBUTEROL SULFATE HFA 108 (90 BASE) MCG/ACT IN AERS: 2.0000 | INHALATION_SPRAY | RESPIRATORY_TRACT | Status: DC | PRN

## 2013-08-06 MED ORDER — ENSURE COMPLETE PO LIQD: 237.0000 mL | Freq: Two times a day (BID) | ORAL | Status: DC

## 2013-08-06 MED ORDER — BENZONATATE 200 MG PO CAPS: 200.0000 mg | ORAL_CAPSULE | Freq: Three times a day (TID) | ORAL | Status: DC | PRN

## 2013-08-06 MED ORDER — LEVOFLOXACIN 750 MG PO TABS: 750.0000 mg | ORAL_TABLET | Freq: Every day | ORAL | Status: DC

## 2013-08-06 MED ORDER — LEVOFLOXACIN 750 MG PO TABS
750.0000 mg | ORAL_TABLET | ORAL | Status: DC
  Filled 2013-08-06: qty 1

## 2013-08-06 MED ORDER — ALBUTEROL SULFATE (5 MG/ML) 0.5% IN NEBU: 2.5000 mg | INHALATION_SOLUTION | Freq: Four times a day (QID) | RESPIRATORY_TRACT | Status: DC | PRN

## 2013-08-06 MED ORDER — PREDNISONE (PAK) 10 MG PO TABS: ORAL_TABLET | Freq: Every day | ORAL | Status: DC

## 2013-08-06 MED ORDER — ALBUTEROL (5 MG/ML) CONTINUOUS INHALATION SOLN: 10.0000 mg/h | INHALATION_SOLUTION | RESPIRATORY_TRACT | Status: DC

## 2013-08-06 NOTE — Discharge Summary (Addendum)
Physician Discharge Summary  Chonita Gadea ZOX:[id number] DOB: 03-Feb-1976 DOA: 08/03/2013  PCP: Lu Duffel, MD  Admit date: 08/03/2013 Discharge date: 08/06/2013  Time spent: 35 minutes  Recommendations for Outpatient Follow-up:  Patient will be discharged home with home health. She will receive nursing care and guidance. She continue taking her medications as prescribed. She will also be discharged with nebulizer as well as nebulizer treatments. Patient was strongly urged to stop smoking and to avoid irritants.  Discharge Diagnoses:  Principal Problem:   Sepsis secondary to URI   Asthma exacerbation Active Problems:   Tobacco use disorder   Chronic pain syndrome   Sepsis   Anemia  Discharge Condition: Stable  Diet recommendation: Carb modified  Filed Weights   08/04/13 0011 08/04/13 2039 08/05/13 2024  Weight: 82.146 kg (181 lb 1.6 oz) 82.146 kg (181 lb 1.6 oz) 82.146 kg (181 lb 1.6 oz)    History of present illness:  Julie Kerr is a 37 y.o. female with Past medical history of asthma, anxiety. The patient is coming from home.  Patient presents with complaints of cough shortness of breath and wheezing that has been ongoing since last 2 days. She also complained of fever with temperature of 102 Fahrenheit at home. She has albuterol nebulizations at home but that has not improved her symptoms. She denies any sick contact or recent travel. She mentions she has leg swelling with distention of her abdomen. She also mentions about nausea and one episode of vomiting yesterday. She denies any diarrhea or burning urination. She has reported a poor appetite since last 3 days. She denies any pain in the chest but mentions her chest is tight and gets worse when she takes a deep breath. She continues to smoke 10-15 cigarettes a day. She denies any alcohol abuse or aspiration.   Hospital Course:  This is a 37 year old female history of asthma and anxiety and continued nicotine abuse that  presents emergency department for shortness of breath as well as wheezing and been ongoing for approximately 2 days. Patient also had fever with temperature 100.33F at home. Patient was thought to have possible pneumonia versus upper respiratory infection versus influenza. Her influenza was negative, her strep pneumonia antigen was also negative. Patient was unable to provide a sputum for culture and Gram stain as she did have a dry cough. Patient was placed on Tamiflu initially to cover influenza however again this was negative discontinued. Patient was continued on Levaquin and will be discharged with that. Patient was also found to have leukocytosis which may benefit acute phase reactant as it did also increase with Solu-Medrol. Patient's breathing did actually improve. Her wheezing as well as cough improved as well. Patient was given a nebulizer treatments as well as guaifenesin. Patient will be discharged with a prednisone taper as well as nebulizer and nebulizer treatments. Patient also had complaints of chronic pain which is continued on her MS Contin with Dilaudid for breakthrough pain. She is also noted to have hyperglycemia however has no history of diabetes this is likely secondary to her steroids. Patient does have anemia however her hemoglobin did remain stable. Patient was also given nicotine and tobacco cessation counseling. Patient is to follow up with her primary care physician within one week of discharge. Again she will be given the remainder of antibiotic therapy as well as nebulizer treatments. Patient will be discharged with home health for nursing, for teaching. Patient does understand and agree with this plan.  Procedures: None  Consultations: None  Discharge  Exam: Filed Vitals:   08/06/13 0931  BP: 107/70  Pulse: 98  Temp: 98.1 F (36.7 C)  Resp: 18   Exam  General: Well developed, well nourished, mild distress, appears stated age  HEENT: NCAT, PERRLA, mucous membranes  moist.  Neck: Supple, no JVD, no masses  Cardiovascular: S1 S2 auscultated, no rubs, murmurs or gallops. Regular rate and rhythm.  Respiratory: Breath sounds clear Abdomen: Soft, nontender, nondistended, + bowel sounds  Extremities: warm dry without cyanosis clubbing. Trace lower extremity edema  Neuro: AAOx3, cranial nerves grossly intact.  Skin: Without rashes exudates or nodules  Psych: Normal affect and demeanor with intact judgement and insight  Discharge Instructions      Discharge Orders   Future Orders Complete By Expires   Diet - low sodium heart healthy  As directed    Discharge instructions  As directed    Comments:     Patient will be discharged home with home health. She will receive nursing care and guidance. She continue taking her medications as prescribed. She will also be discharged with nebulizer as well as nebulizer treatments. Patient was strongly urged to stop smoking and to avoid irritants.   DME Nebulizer machine  As directed    DME Nebulizer/meds  As directed    Face-to-face encounter (required for Medicare/Medicaid patients)  As directed    Comments:     I Sariya Trickey certify that this patient is under my care and that I, or a nurse practitioner or physician's assistant working with me, had a face-to-face encounter that meets the physician face-to-face encounter requirements with this patient on 08/06/2013. The encounter with the patient was in whole, or in part for the following medical condition(s) which is the primary reason for home health care (List medical condition): Asthma exacerbation, COPD. Patient was very short of breath with limited amount of movement or exertion.   Questions:     The encounter with the patient was in whole, or in part, for the following medical condition, which is the primary reason for home health care:  Asthma/copd Exacerbation   I certify that, based on my findings, the following services are medically necessary home health  services:  Nursing   My clinical findings support the need for the above services:  Shortness of breath with activity   Further, I certify that my clinical findings support that this patient is homebound due to:  Shortness of Breath with activity   Reason for Medically Necessary Home Health Services:  Skilled Nursing- Skilled Assessment/Observation   Skilled Nursing- Teaching of Disease Process/Symptom Management   Home Health  As directed    Questions:     To provide the following care/treatments:  RN   Increase activity slowly  As directed        Medication List         albuterol 108 (90 BASE) MCG/ACT inhaler  Commonly known as:  PROVENTIL HFA;VENTOLIN HFA  Inhale 2 puffs into the lungs every 4 (four) hours as needed for wheezing.     albuterol (5 MG/ML) 0.5% nebulizer solution  Commonly known as:  PROVENTIL  Take 0.5 mLs (2.5 mg total) by nebulization every 6 (six) hours as needed for wheezing or shortness of breath.     albuterol (5 MG/ML) 0.5% Nebu  Commonly known as:  PROVENTIL, VENTOLIN  Take 10 mg/hr by nebulization continuous.     ALPRAZolam 1 MG tablet  Commonly known as:  XANAX  Take 1 mg by mouth 3 (three)  times daily.     benzonatate 200 MG capsule  Commonly known as:  TESSALON  Take 1 capsule (200 mg total) by mouth 3 (three) times daily as needed for cough.     feeding supplement (ENSURE COMPLETE) Liqd  Take 237 mLs by mouth 2 (two) times daily between meals.     Fluticasone-Salmeterol 250-50 MCG/DOSE Aepb  Commonly known as:  ADVAIR  Inhale 1 puff into the lungs 2 (two) times daily.     guaiFENesin 600 MG 12 hr tablet  Commonly known as:  MUCINEX  Take 1 tablet (600 mg total) by mouth 2 (two) times daily.     levofloxacin 750 MG tablet  Commonly known as:  LEVAQUIN  Take 1 tablet (750 mg total) by mouth daily.     morphine 15 MG 12 hr tablet  Commonly known as:  MS CONTIN  Take 15 mg by mouth every 12 (twelve) hours.     oxyCODONE 15 MG immediate  release tablet  Commonly known as:  ROXICODONE  Take 15 mg by mouth 3 (three) times daily as needed for pain (breakthrough).     predniSONE 10 MG tablet  Commonly known as:  STERAPRED UNI-PAK  - Take by mouth daily. Prednisone dosing: Take  Prednisone 40mg  (4 tabs) x 3 days, then taper to 30mg  (3 tabs) x 3 days, then 20mg  (2 tabs) x 3days, then 10mg  (1 tab) x 3days, then OFF.  -   - Dispense:  30 tabs, refills: None     SINGULAIR 10 MG tablet  Generic drug:  montelukast  Take 10 mg by mouth daily.     verapamil 120 MG tablet  Commonly known as:  CALAN  Take 120 mg by mouth daily.     zolpidem 10 MG tablet  Commonly known as:  AMBIEN  Take 10 mg by mouth at bedtime.       Allergies  Allergen Reactions  . Robitussin Dm [Dextromethorphan-Guaifenesin] Nausea And Vomiting  . Nsaids Hives  . Tramadol Hives   Follow-up Information   Follow up with WELLS,WENDELL, MD. Schedule an appointment as soon as possible for a visit in 1 week.   Specialty:  Family Medicine       The results of significant diagnostics from this hospitalization (including imaging, microbiology, ancillary and laboratory) are listed below for reference.    Significant Diagnostic Studies: Dg Chest Portable 1 View  08/03/2013   CLINICAL DATA:  Shortness of breath, cough, wheezing  EXAM: PORTABLE CHEST - 1 VIEW  COMPARISON:  06/14/2013  FINDINGS: Lungs are clear.  No pleural effusion or pneumothorax.  Cardiomegaly.  IMPRESSION: No evidence of acute cardiopulmonary disease.   Electronically Signed   By: Charline Bills M.D.   On: 08/03/2013 21:22    Microbiology: No results found for this or any previous visit (from the past 240 hour(s)).   Labs: Basic Metabolic Panel:  Recent Labs Lab 08/03/13 2150 08/04/13 0530 08/05/13 0746  NA 138 137 135  K 2.8* 3.4* 4.3  CL 106 100 103  CO2 19 14* 18*  GLUCOSE 207* 255* 149*  BUN 7 7 8   CREATININE 0.66 0.71 0.63  CALCIUM 8.5 9.4 9.4   Liver Function  Tests:  Recent Labs Lab 08/04/13 0530  AST 12  ALT 9  ALKPHOS 51  BILITOT 0.2*  PROT 7.3  ALBUMIN 3.4*   No results found for this basename: LIPASE, AMYLASE,  in the last 168 hours No results found for this basename: AMMONIA,  in the last 168 hours CBC:  Recent Labs Lab 08/03/13 2150 08/04/13 0530 08/05/13 0746 08/06/13 0315  WBC 8.4 18.2* 32.6* 29.8*  NEUTROABS 4.7 17.6*  --   --   HGB 10.5* 10.2* 10.1* 10.9*  HCT 32.4* 31.4* 30.8* 33.5*  MCV 78.1 77.9* 77.8* 78.5  PLT 270 282 320 358   Cardiac Enzymes: No results found for this basename: CKTOTAL, CKMB, CKMBINDEX, TROPONINI,  in the last 168 hours BNP: BNP (last 3 results)  Recent Labs  06/14/13 1610  PROBNP 26.1   CBG:  Recent Labs Lab 08/05/13 0751 08/05/13 1139 08/05/13 1704 08/05/13 2027 08/06/13 0731  GLUCAP 157* 164* 206* 134* 156*       Signed:  Teonia Yager  [facility] 08/06/2013, 10:18 AM  Addendum: Patient was diagnosed with severe malnutrition due to her 9% weight loss due to sepsis/infection.  Although her albumin was noted to be 3, in the setting of sepsis/infection, the albumin may not be an accurate indicator of protein status of visceral organs.

## 2013-08-06 NOTE — Care Management Note (Signed)
   CARE MANAGEMENT NOTE 08/06/2013  Patient:  Julie Kerr, Julie Kerr   Account Number:  [id number]  Date Initiated:  08/06/2013  Documentation initiated by:  Raahil Ong  Subjective/Objective Assessment:   Orders for [facility] and hand held neb     Action/Plan:   [facility] notified of need for hand held neb, met with pt who requested [facility] for [facility] and a nurse named Kaitlyn, as she has worked with her previously. [facility] notified.   Anticipated DC Date:  08/06/2013   Anticipated DC Plan:  HOME W HOME HEALTH SERVICES         [facility] Choice  HOME HEALTH   Choice offered to / List presented to:     DME arranged  NEBULIZER MACHINE      DME agency  [facility]     [facility] arranged  HH-1 RN      [facility] agency  [facility]   Status of service:  Completed, signed off Medicare Important Message given?   (If response is "NO", the following Medicare IM given date fields will be blank) Date Medicare IM given:   Date Additional Medicare IM given:    Discharge Disposition:  HOME W HOME HEALTH SERVICES  Per UR Regulation:    If discussed at Long Length of Stay Meetings, dates discussed:    Comments:

## 2013-08-09 ENCOUNTER — Other Ambulatory Visit: Payer: Self-pay

## 2013-08-09 ENCOUNTER — Emergency Department (HOSPITAL_COMMUNITY): Payer: Medicaid Other

## 2013-08-09 ENCOUNTER — Inpatient Hospital Stay (HOSPITAL_COMMUNITY)
Admission: EM | Admit: 2013-08-09 | Discharge: 2013-08-11 | DRG: 190 | Disposition: A | Discharge status: Home / Self Care | Payer: Medicaid Other | Attending: Internal Medicine | Admitting: Internal Medicine

## 2013-08-09 ENCOUNTER — Encounter (HOSPITAL_COMMUNITY): Payer: Self-pay | Admitting: Emergency Medicine

## 2013-08-09 DIAGNOSIS — Z79899 Other long term (current) drug therapy: Secondary | ICD-10-CM

## 2013-08-09 DIAGNOSIS — J9600 Acute respiratory failure, unspecified whether with hypoxia or hypercapnia: Secondary | ICD-10-CM | POA: Diagnosis present

## 2013-08-09 DIAGNOSIS — F411 Generalized anxiety disorder: Secondary | ICD-10-CM | POA: Diagnosis present

## 2013-08-09 DIAGNOSIS — J9601 Acute respiratory failure with hypoxia: Secondary | ICD-10-CM | POA: Diagnosis present

## 2013-08-09 DIAGNOSIS — D649 Anemia, unspecified: Secondary | ICD-10-CM | POA: Diagnosis present

## 2013-08-09 DIAGNOSIS — J449 Chronic obstructive pulmonary disease, unspecified: Principal | ICD-10-CM | POA: Diagnosis present

## 2013-08-09 DIAGNOSIS — J209 Acute bronchitis, unspecified: Secondary | ICD-10-CM | POA: Diagnosis present

## 2013-08-09 DIAGNOSIS — Z981 Arthrodesis status: Secondary | ICD-10-CM

## 2013-08-09 DIAGNOSIS — J45902 Unspecified asthma with status asthmaticus: Principal | ICD-10-CM | POA: Diagnosis present

## 2013-08-09 DIAGNOSIS — G894 Chronic pain syndrome: Secondary | ICD-10-CM | POA: Diagnosis present

## 2013-08-09 DIAGNOSIS — M549 Dorsalgia, unspecified: Secondary | ICD-10-CM | POA: Diagnosis present

## 2013-08-09 DIAGNOSIS — Z8701 Personal history of pneumonia (recurrent): Secondary | ICD-10-CM

## 2013-08-09 DIAGNOSIS — J45901 Unspecified asthma with (acute) exacerbation: Secondary | ICD-10-CM | POA: Diagnosis present

## 2013-08-09 DIAGNOSIS — F17200 Nicotine dependence, unspecified, uncomplicated: Secondary | ICD-10-CM | POA: Diagnosis present

## 2013-08-09 DIAGNOSIS — J40 Bronchitis, not specified as acute or chronic: Secondary | ICD-10-CM

## 2013-08-09 DIAGNOSIS — R0603 Acute respiratory distress: Secondary | ICD-10-CM

## 2013-08-09 DIAGNOSIS — F1920 Other psychoactive substance dependence, uncomplicated: Secondary | ICD-10-CM | POA: Diagnosis present

## 2013-08-09 LAB — BASIC METABOLIC PANEL
BUN: 11 mg/dL (ref 6–23)
Calcium: 9.2 mg/dL (ref 8.4–10.5)
Creatinine, Ser: 0.71 mg/dL (ref 0.50–1.10)
GFR calc Af Amer: >90 mL/min (ref >90)
GFR calc non Af Amer: >90 mL/min (ref >90)
Potassium: 4.6 mEq/L (ref 3.5–5.1)

## 2013-08-09 LAB — CBC
HCT: 38.8 % (ref 36.0–46.0)
MCH: 25 pg — ABNORMAL LOW (ref 26.0–34.0)
MCHC: 33.2 g/dL (ref 30.0–36.0)
MCV: 75.3 fL — ABNORMAL LOW (ref 78.0–100.0)
Platelets: 339 10*3/uL (ref 150–400)
RDW: 17.4 % — ABNORMAL HIGH (ref 11.5–15.5)

## 2013-08-09 LAB — POCT I-STAT TROPONIN I: Troponin i, poc: 0 ng/mL (ref 0.00–0.08)

## 2013-08-09 MED ORDER — SODIUM CHLORIDE 0.9 % IV BOLUS (SEPSIS)
1000.0000 mL | Freq: Once | INTRAVENOUS | Status: AC
  Administered 2013-08-09: 1000 mL via INTRAVENOUS

## 2013-08-09 MED ORDER — MORPHINE SULFATE 4 MG/ML IJ SOLN
4.0000 mg | Freq: Once | INTRAMUSCULAR | Status: AC
  Administered 2013-08-09: 4 mg via INTRAVENOUS
  Filled 2013-08-09: qty 1

## 2013-08-09 MED ORDER — HYDROMORPHONE HCL PF 1 MG/ML IJ SOLN
1.0000 mg | Freq: Once | INTRAMUSCULAR | Status: AC
  Administered 2013-08-10: 1 mg via INTRAVENOUS
  Filled 2013-08-09: qty 1

## 2013-08-09 MED ORDER — ALBUTEROL SULFATE (5 MG/ML) 0.5% IN NEBU
15.0000 mg | INHALATION_SOLUTION | Freq: Once | RESPIRATORY_TRACT | Status: AC
  Administered 2013-08-09: 15 mg via RESPIRATORY_TRACT

## 2013-08-09 MED ORDER — ACETAMINOPHEN 325 MG PO TABS
650.0000 mg | ORAL_TABLET | Freq: Once | ORAL | Status: AC
  Administered 2013-08-10: 650 mg via ORAL
  Filled 2013-08-09: qty 2

## 2013-08-09 MED ORDER — IPRATROPIUM BROMIDE 0.02 % IN SOLN
0.5000 mg | Freq: Once | RESPIRATORY_TRACT | Status: AC
  Administered 2013-08-09: 0.5 mg via RESPIRATORY_TRACT

## 2013-08-09 MED ORDER — ALBUTEROL SULFATE (5 MG/ML) 0.5% IN NEBU
5.0000 mg | INHALATION_SOLUTION | Freq: Once | RESPIRATORY_TRACT | Status: AC
  Administered 2013-08-09: 5 mg via RESPIRATORY_TRACT

## 2013-08-09 NOTE — ED Notes (Signed)
RT and resident at bedside to evaluate pt. Pt placed on hour long nebulizer. Pt states that she is having generalized body aches/joint pain. Pt states that she has been using her nebulizer all day at home with no relief. Pt alert and oriented, able to move all extremities and follow commands. Pt coughing states intermittent productivity.

## 2013-08-09 NOTE — ED Notes (Signed)
Lanell Persons - friend - cell: [phone] home: [phone]  Would like to be called if we admit pt.

## 2013-08-09 NOTE — ED Notes (Signed)
Presents with audible wheezes, inability to speak, tripoding, TAchypnea, bilateral breath sounds diminished, +use of accessory muscles. Ongoing all day.

## 2013-08-10 ENCOUNTER — Encounter (HOSPITAL_COMMUNITY): Payer: Self-pay | Admitting: Internal Medicine

## 2013-08-10 DIAGNOSIS — G894 Chronic pain syndrome: Secondary | ICD-10-CM

## 2013-08-10 DIAGNOSIS — R0609 Other forms of dyspnea: Secondary | ICD-10-CM

## 2013-08-10 DIAGNOSIS — R0989 Other specified symptoms and signs involving the circulatory and respiratory systems: Secondary | ICD-10-CM

## 2013-08-10 DIAGNOSIS — J45901 Unspecified asthma with (acute) exacerbation: Secondary | ICD-10-CM

## 2013-08-10 DIAGNOSIS — D649 Anemia, unspecified: Secondary | ICD-10-CM

## 2013-08-10 DIAGNOSIS — J40 Bronchitis, not specified as acute or chronic: Secondary | ICD-10-CM

## 2013-08-10 LAB — CBC
HCT: 37.4 % (ref 36.0–46.0)
Hemoglobin: 12.3 g/dL (ref 12.0–15.0)
MCV: 76.8 fL — ABNORMAL LOW (ref 78.0–100.0)
Platelets: 297 10*3/uL (ref 150–400)
RDW: 17.7 % — ABNORMAL HIGH (ref 11.5–15.5)
WBC: 13.2 10*3/uL — ABNORMAL HIGH (ref 4.0–10.5)

## 2013-08-10 LAB — CREATININE, SERUM
Creatinine, Ser: 0.8 mg/dL (ref 0.50–1.10)
GFR calc Af Amer: >90 mL/min (ref >90)
GFR calc non Af Amer: >90 mL/min (ref >90)

## 2013-08-10 MED ORDER — VERAPAMIL HCL 120 MG PO TABS
120.0000 mg | ORAL_TABLET | Freq: Every day | ORAL | Status: DC
Start: 2013-08-10 — End: 2013-08-11
  Administered 2013-08-10 – 2013-08-11 (×2): 120 mg via ORAL
  Filled 2013-08-10 (×2): qty 1

## 2013-08-10 MED ORDER — ALBUTEROL SULFATE (5 MG/ML) 0.5% IN NEBU: 2.5000 mg | INHALATION_SOLUTION | RESPIRATORY_TRACT | Status: DC

## 2013-08-10 MED ORDER — ALBUTEROL SULFATE (5 MG/ML) 0.5% IN NEBU
2.5000 mg | INHALATION_SOLUTION | Freq: Four times a day (QID) | RESPIRATORY_TRACT | Status: DC
  Administered 2013-08-10: 2.5 mg via RESPIRATORY_TRACT

## 2013-08-10 MED ORDER — HYDROMORPHONE HCL PF 1 MG/ML IJ SOLN
1.0000 mg | INTRAMUSCULAR | Status: DC | PRN
  Administered 2013-08-10 – 2013-08-11 (×11): 1 mg via INTRAVENOUS
  Filled 2013-08-10 (×11): qty 1

## 2013-08-10 MED ORDER — WHITE PETROLATUM GEL
Status: AC
  Administered 2013-08-10: 1
  Filled 2013-08-10: qty 5

## 2013-08-10 MED ORDER — PANTOPRAZOLE SODIUM 40 MG PO TBEC
40.0000 mg | DELAYED_RELEASE_TABLET | Freq: Every day | ORAL | Status: DC
  Administered 2013-08-10 – 2013-08-11 (×2): 40 mg via ORAL
  Filled 2013-08-10 (×2): qty 1

## 2013-08-10 MED ORDER — ALBUTEROL SULFATE (5 MG/ML) 0.5% IN NEBU
2.5000 mg | INHALATION_SOLUTION | RESPIRATORY_TRACT | Status: DC | PRN
  Administered 2013-08-11: 2.5 mg via RESPIRATORY_TRACT
  Filled 2013-08-10 (×2): qty 0.5

## 2013-08-10 MED ORDER — ONDANSETRON HCL 4 MG PO TABS: 4.0000 mg | ORAL_TABLET | Freq: Four times a day (QID) | ORAL | Status: DC | PRN

## 2013-08-10 MED ORDER — DOCUSATE SODIUM 100 MG PO CAPS
100.0000 mg | ORAL_CAPSULE | Freq: Two times a day (BID) | ORAL | Status: DC
  Administered 2013-08-10 – 2013-08-11 (×2): 100 mg via ORAL
  Filled 2013-08-10 (×4): qty 1

## 2013-08-10 MED ORDER — FUROSEMIDE 10 MG/ML IJ SOLN
40.0000 mg | Freq: Three times a day (TID) | INTRAMUSCULAR | Status: AC
  Administered 2013-08-10 (×2): 40 mg via INTRAVENOUS
  Filled 2013-08-10 (×2): qty 4

## 2013-08-10 MED ORDER — GUAIFENESIN ER 600 MG PO TB12
1200.0000 mg | ORAL_TABLET | Freq: Two times a day (BID) | ORAL | Status: DC
  Administered 2013-08-10 – 2013-08-11 (×3): 1200 mg via ORAL
  Filled 2013-08-10 (×4): qty 2

## 2013-08-10 MED ORDER — HYDROCODONE-HOMATROPINE 5-1.5 MG/5ML PO SYRP
5.0000 mL | ORAL_SOLUTION | ORAL | Status: DC | PRN
  Administered 2013-08-10: 5 mL via ORAL
  Filled 2013-08-10: qty 5

## 2013-08-10 MED ORDER — LEVOFLOXACIN IN D5W 750 MG/150ML IV SOLN
750.0000 mg | INTRAVENOUS | Status: DC
  Administered 2013-08-10 – 2013-08-11 (×2): 750 mg via INTRAVENOUS
  Filled 2013-08-10 (×2): qty 150

## 2013-08-10 MED ORDER — MAGNESIUM SULFATE 40 MG/ML IJ SOLN
2.0000 g | Freq: Once | INTRAMUSCULAR | Status: AC
  Administered 2013-08-10: 04:00:00 2 g via INTRAVENOUS
  Filled 2013-08-10: qty 50

## 2013-08-10 MED ORDER — ONDANSETRON HCL 4 MG/2ML IJ SOLN: 4.0000 mg | Freq: Four times a day (QID) | INTRAMUSCULAR | Status: DC | PRN

## 2013-08-10 MED ORDER — ENSURE COMPLETE PO LIQD
237.0000 mL | Freq: Two times a day (BID) | ORAL | Status: DC
  Administered 2013-08-10 (×2): 237 mL via ORAL

## 2013-08-10 MED ORDER — METHYLPREDNISOLONE SODIUM SUCC 40 MG IJ SOLR
40.0000 mg | Freq: Four times a day (QID) | INTRAMUSCULAR | Status: DC
  Administered 2013-08-10 – 2013-08-11 (×6): 40 mg via INTRAVENOUS
  Filled 2013-08-10 (×10): qty 1

## 2013-08-10 MED ORDER — ENOXAPARIN SODIUM 40 MG/0.4ML ~~LOC~~ SOLN
40.0000 mg | SUBCUTANEOUS | Status: DC
  Administered 2013-08-10 – 2013-08-11 (×2): 40 mg via SUBCUTANEOUS
  Filled 2013-08-10 (×3): qty 0.4

## 2013-08-10 MED ORDER — MONTELUKAST SODIUM 10 MG PO TABS
10.0000 mg | ORAL_TABLET | Freq: Every day | ORAL | Status: DC
Start: 2013-08-10 — End: 2013-08-11
  Administered 2013-08-10 – 2013-08-11 (×2): 10 mg via ORAL
  Filled 2013-08-10 (×2): qty 1

## 2013-08-10 MED ORDER — MORPHINE SULFATE ER 15 MG PO TBCR
15.0000 mg | EXTENDED_RELEASE_TABLET | Freq: Two times a day (BID) | ORAL | Status: DC
  Administered 2013-08-10 – 2013-08-11 (×4): 15 mg via ORAL
  Filled 2013-08-10 (×4): qty 1

## 2013-08-10 MED ORDER — PREDNISONE 20 MG PO TABS
60.0000 mg | ORAL_TABLET | Freq: Once | ORAL | Status: AC
  Administered 2013-08-10: 60 mg via ORAL
  Filled 2013-08-10: qty 3

## 2013-08-10 MED ORDER — ALBUTEROL SULFATE (5 MG/ML) 0.5% IN NEBU
2.5000 mg | INHALATION_SOLUTION | Freq: Two times a day (BID) | RESPIRATORY_TRACT | Status: DC
  Administered 2013-08-10 – 2013-08-11 (×3): 2.5 mg via RESPIRATORY_TRACT
  Filled 2013-08-10 (×3): qty 0.5

## 2013-08-10 MED ORDER — OXYCODONE HCL 5 MG PO TABS
15.0000 mg | ORAL_TABLET | Freq: Three times a day (TID) | ORAL | Status: DC | PRN
  Administered 2013-08-10 – 2013-08-11 (×4): 15 mg via ORAL
  Filled 2013-08-10 (×4): qty 3

## 2013-08-10 MED ORDER — SODIUM CHLORIDE 0.9 % IV SOLN
INTRAVENOUS | Status: DC
  Administered 2013-08-10 (×2): via INTRAVENOUS

## 2013-08-10 NOTE — H&P (Signed)
[facility] History and Physical  Julie Kerr ZOX:[id number] DOB: 09/11/1975    PCP:   Lu Duffel, MD   Chief Complaint: persistent shortness of breath and coughs.  HPI: Julie Kerr is an 37 y.o. female with hx of chronic back pain on chronic narcotics, hx of asthma, anxiety, just discharged last week for asthma exacerbation on steroid taper, returned to the ER with persistent shortness of breath, incessant coughs, increase back pain, and having arthalgia without myalgia.  She has no fever or chills, and has no chest pain.  She denied lower extremity weaknss.  She said she was compliance with her steroid, but was exposed to second hand smokes.  Evaluation in the ER included a CXR which showed no PNA, WBC of 13K, normal renal Fx tests, and BS of 151.  Hospitalist was asked to admit her for now status asthmaticus.  Rewiew of Systems:  Constitutional: Negative for malaise, fever and chills. No significant weight loss or weight gain Eyes: Negative for eye pain, redness and discharge, diplopia, visual changes, or flashes of light. ENMT: Negative for ear pain, hoarseness, nasal congestion, sinus pressure and sore throat. No headaches; tinnitus, drooling, or problem swallowing. Cardiovascular: Negative for chest pain, palpitations, diaphoresis, and peripheral edema. ; No orthopnea, PND Respiratory: Negative for hemoptysis,  and stridor. No pleuritic chestpain. Gastrointestinal: Negative for nausea, vomiting, diarrhea, constipation, abdominal pain, melena, blood in stool, hematemesis, jaundice and rectal bleeding.    Genitourinary: Negative for frequency, dysuria, incontinence,flank pain and hematuria; Musculoskeletal: Negative for neck pain. Negative for swelling and trauma.;  Skin: . Negative for pruritus, rash, abrasions, bruising and skin lesion.; ulcerations Neuro: Negative for headache, lightheadedness and neck stiffness. Negative for weakness, altered level of consciousness , altered  mental status, extremity weakness, burning feet, involuntary movement, seizure and syncope.  Psych: negative for anxiety, depression, insomnia, tearfulness, panic attacks, hallucinations, paranoia, suicidal or homicidal ideation    Past Medical History  Diagnosis Date  . Asthma   . Anxiety   . COPD (chronic obstructive pulmonary disease)   . Bronchitis     Past Surgical History  Procedure Laterality Date  . Spinal fusion      Medications:  HOME MEDS: Prior to Admission medications   Medication Sig Start Date End Date Taking? Authorizing Provider  albuterol (PROVENTIL HFA;VENTOLIN HFA) 108 (90 BASE) MCG/ACT inhaler Inhale 2 puffs into the lungs every 4 (four) hours as needed for wheezing. 08/06/13  Yes Maryann Mikhail, DO  albuterol (PROVENTIL) (5 MG/ML) 0.5% nebulizer solution Take 0.5 mLs (2.5 mg total) by nebulization every 6 (six) hours as needed for wheezing or shortness of breath. 08/06/13  Yes Maryann Mikhail, DO  ALPRAZolam (XANAX) 1 MG tablet Take 1 mg by mouth 3 (three) times daily.   Yes Historical Provider, MD  benzonatate (TESSALON) 200 MG capsule Take 1 capsule (200 mg total) by mouth 3 (three) times daily as needed for cough. 08/06/13  Yes Maryann Mikhail, DO  feeding supplement, ENSURE COMPLETE, (ENSURE COMPLETE) LIQD Take 237 mLs by mouth 2 (two) times daily between meals. 08/06/13  Yes Maryann Mikhail, DO  Fluticasone-Salmeterol (ADVAIR) 250-50 MCG/DOSE AEPB Inhale 1 puff into the lungs 2 (two) times daily. 06/18/13  Yes Elease Etienne, MD  guaiFENesin (MUCINEX) 600 MG 12 hr tablet Take 1 tablet (600 mg total) by mouth 2 (two) times daily. 06/18/13  Yes Elease Etienne, MD  levofloxacin (LEVAQUIN) 750 MG tablet Take 1 tablet (750 mg total) by mouth daily. 08/06/13  Yes Edsel Petrin, DO  montelukast (SINGULAIR) 10 MG tablet Take 10 mg by mouth daily.    Yes Historical Provider, MD  morphine (MS CONTIN) 15 MG 12 hr tablet Take 15 mg by mouth every 12 (twelve) hours.    Yes Historical Provider, MD  oxyCODONE (ROXICODONE) 15 MG immediate release tablet Take 15 mg by mouth 3 (three) times daily as needed for pain (breakthrough).   Yes Historical Provider, MD  predniSONE (STERAPRED UNI-PAK) 10 MG tablet Take by mouth daily. Prednisone dosing: Take  Prednisone 40mg  (4 tabs) x 3 days, then taper to 30mg  (3 tabs) x 3 days, then 20mg  (2 tabs) x 3days, then 10mg  (1 tab) x 3days, then OFF.  Dispense:  30 tabs, refills: None 08/06/13  Yes Maryann Mikhail, DO  verapamil (CALAN) 120 MG tablet Take 120 mg by mouth daily.    Yes Historical Provider, MD  zolpidem (AMBIEN) 10 MG tablet Take 10 mg by mouth at bedtime.   Yes Historical Provider, MD     Allergies:  Allergies  Allergen Reactions  . Robitussin Dm [Dextromethorphan-Guaifenesin] Nausea And Vomiting  . Nsaids Hives  . Tramadol Hives    Social History:   reports that she has been smoking.  She does not have any smokeless tobacco history on file. She reports that she does not drink alcohol. Her drug history is not on file.  Family History: History reviewed. No pertinent family history.   Physical Exam: Filed Vitals:   08/10/13 0211 08/10/13 0227 08/10/13 0246 08/10/13 0434  BP:  136/68  101/61  Pulse:  95  97  Temp:  99.4 F (37.4 C)  98.6 F (37 C)  TempSrc:  Oral  Oral  Resp:  20  18  Height:  5\' 2"  (1.575 m)    Weight:  81 kg (178 lb 9.2 oz)    SpO2: 98% 98% 98% 99%   Blood pressure 101/61, pulse 97, temperature 98.6 F (37 C), temperature source Oral, resp. rate 18, height 5\' 2"  (1.575 m), weight 81 kg (178 lb 9.2 oz), last menstrual period 07/20/2013, SpO2 99.00%.  GEN:  Pleasant  patient lying in the stretcher in no acute distress; cooperative with exam. PSYCH:  alert and oriented x4; does not appear anxious or depressed; affect is appropriate. HEENT: Mucous membranes pink and anicteric; PERRLA; EOM intact; no cervical lymphadenopathy nor thyromegaly or carotid bruit; no JVD; There were no  stridor. Neck is very supple. Breasts:: Not examined CHEST WALL: No tenderness CHEST: Normal respiration, tight wheezing both inspiratory and exp.  No rales. HEART: Regular rate and rhythm.  There are no murmur, rub, or gallops.   BACK: No kyphosis or scoliosis; no CVA tenderness ABDOMEN: soft and non-tender; no masses, no organomegaly, normal abdominal bowel sounds; no pannus; no intertriginous candida. There is no rebound and no distention. Rectal Exam: Not done EXTREMITIES: No bone or joint deformity; age-appropriate arthropathy of the hands and knees; no edema; no ulcerations.  There is no calf tenderness. Genitalia: not examined PULSES: 2+ and symmetric SKIN: Normal hydration no rash or ulceration CNS: Cranial nerves 2-12 grossly intact no focal lateralizing neurologic deficit.  Speech is fluent; uvula elevated with phonation, facial symmetry and tongue midline. DTR are normal bilaterally, cerebella exam is intact, barbinski is negative and strengths are equaled bilaterally.  No sensory loss.   Labs on Admission:  Basic Metabolic Panel:  Recent Labs Lab 08/03/13 2150 08/04/13 0530 08/05/13 0746 08/09/13 2201 08/10/13 0315  NA 138 137 135 135  --   K  2.8* 3.4* 4.3 4.6  --   CL 106 100 103 100  --   CO2 19 14* 18* 22  --   GLUCOSE 207* 255* 149* 89  --   BUN 7 7 8 11   --   CREATININE 0.66 0.71 0.63 0.71 0.80  CALCIUM 8.5 9.4 9.4 9.2  --    Liver Function Tests:  Recent Labs Lab 08/04/13 0530  AST 12  ALT 9  ALKPHOS 51  BILITOT 0.2*  PROT 7.3  ALBUMIN 3.4*   No results found for this basename: LIPASE, AMYLASE,  in the last 168 hours No results found for this basename: AMMONIA,  in the last 168 hours CBC:  Recent Labs Lab 08/03/13 2150 08/04/13 0530 08/05/13 0746 08/06/13 0315 08/09/13 2201 08/10/13 0315  WBC 8.4 18.2* 32.6* 29.8* 8.1 13.2*  NEUTROABS 4.7 17.6*  --   --   --   --   HGB 10.5* 10.2* 10.1* 10.9* 12.9 12.3  HCT 32.4* 31.4* 30.8* 33.5* 38.8  37.4  MCV 78.1 77.9* 77.8* 78.5 75.3* 76.8*  PLT 270 282 320 358 339 297   Cardiac Enzymes: No results found for this basename: CKTOTAL, CKMB, CKMBINDEX, TROPONINI,  in the last 168 hours  CBG:  Recent Labs Lab 08/05/13 1139 08/05/13 1704 08/05/13 2027 08/06/13 0731 08/06/13 1117  GLUCAP 164* 206* 134* 156* 151*     Radiological Exams on Admission: Dg Chest 2 View  08/10/2013   CLINICAL DATA:  Wheezing.  EXAM: CHEST  2 VIEW  COMPARISON:  None.  FINDINGS: The heart size and mediastinal contours are within normal limits. Both lungs are clear. The visualized skeletal structures are unremarkable.  IMPRESSION: No active cardiopulmonary disease.   Electronically Signed   By: Signa Kell M.D.   On: 08/10/2013 00:19   Assessment/Plan Present on Admission:  . Asthma exacerbation . Tobacco use disorder . Anemia . Chronic pain syndrome . Acute respiratory failure  PLAN:  Will admit her for status asthmaticus.  She will be given more IV steroids, and nebs, including another 1 hour neb.  She will need supplemental oxygen.  Will give some IVF as well.  She does have arthralgia, and doesn't know if she as sickle cell, so will send for a hemoglobin electrophoresis.  For her chronic pain, I will give her Dilaudid IV, continue her base morphine IR BID. Will continue with her antibiotics (IV Levaquin).  She is stable, full code, and will be admitted to [facility] service.  She must stop smoking given the severity of her asthma.  Other plans as per orders.  Code Status: FULL Unk Lightning, MD. [facility] Pager [phone] 7pm to 7am.  08/10/2013, 5:20 AM

## 2013-08-10 NOTE — Progress Notes (Signed)
[facility] PROGRESS NOTE  Lavita Pontius ZOX:[id number] DOB: 04/12/1976 DOA: 08/09/2013 PCP: Lu Duffel, MD  HPI/Subjective: Feels much better, less wheezing and cough.  Assessment/Plan: Active Problems:   Acute respiratory failure   Tobacco use disorder   Chronic pain syndrome   Asthma exacerbation   Anemia   Acute severe asthma -Has history of acute respiratory/ventilatory failure intubated 3 times but previously. -Started on IV steroids and bronchodilators. -Supportive management with mucolytics, antitussives and oxygen as needed. -Received IV fluids, discontinue and I will give some Lasix to keep negative fluid balance -She is making a lot of improvement.  Acute bronchitis -Patient reported fever, had a temp of 100.4 in the emergency department -Started on IV antibiotics, Levaquin. Continue mucolytics, antitussives and oxygen as needed.  Chronic back pain/narcotic dependence -Patient is on MS Contin 15 mg 3 times a day,  continue.  Tobacco abuse disorder -Patient counseled extensively   Code Status: Full code Family Communication: Plan discussed with the patient. Disposition Plan: Remains inpatient   Consultants:  None  Procedures:  None  Antibiotics: None  Objective: Filed Vitals:   08/10/13 1017  BP: 128/62  Pulse:   Temp:   Resp:    No intake or output data in the 24 hours ending 08/10/13 1053 Filed Weights   08/10/13 0227  Weight: 81 kg (178 lb 9.2 oz)    Exam: General: Alert and awake, oriented x3, not in any acute distress. HEENT: anicteric sclera, pupils reactive to light and accommodation, EOMI CVS: S1-S2 clear, no murmur rubs or gallops Chest: clear to auscultation bilaterally, no wheezing, rales or rhonchi Abdomen: soft nontender, nondistended, normal bowel sounds, no organomegaly Extremities: no cyanosis, clubbing or edema noted bilaterally Neuro: Cranial nerves II-XII intact, no focal neurological deficits  Data  Reviewed: Basic Metabolic Panel:  Recent Labs Lab 08/03/13 2150 08/04/13 0530 08/05/13 0746 08/09/13 2201 08/10/13 0315  NA 138 137 135 135  --   K 2.8* 3.4* 4.3 4.6  --   CL 106 100 103 100  --   CO2 19 14* 18* 22  --   GLUCOSE 207* 255* 149* 89  --   BUN 7 7 8 11   --   CREATININE 0.66 0.71 0.63 0.71 0.80  CALCIUM 8.5 9.4 9.4 9.2  --    Liver Function Tests:  Recent Labs Lab 08/04/13 0530  AST 12  ALT 9  ALKPHOS 51  BILITOT 0.2*  PROT 7.3  ALBUMIN 3.4*   No results found for this basename: LIPASE, AMYLASE,  in the last 168 hours No results found for this basename: AMMONIA,  in the last 168 hours CBC:  Recent Labs Lab 08/03/13 2150 08/04/13 0530 08/05/13 0746 08/06/13 0315 08/09/13 2201 08/10/13 0315  WBC 8.4 18.2* 32.6* 29.8* 8.1 13.2*  NEUTROABS 4.7 17.6*  --   --   --   --   HGB 10.5* 10.2* 10.1* 10.9* 12.9 12.3  HCT 32.4* 31.4* 30.8* 33.5* 38.8 37.4  MCV 78.1 77.9* 77.8* 78.5 75.3* 76.8*  PLT 270 282 320 358 339 297   Cardiac Enzymes: No results found for this basename: CKTOTAL, CKMB, CKMBINDEX, TROPONINI,  in the last 168 hours BNP (last 3 results)  Recent Labs  06/14/13 1610  PROBNP 26.1   CBG:  Recent Labs Lab 08/05/13 1139 08/05/13 1704 08/05/13 2027 08/06/13 0731 08/06/13 1117  GLUCAP 164* 206* 134* 156* 151*    Micro No results found for this or any previous visit (from the past 240 hour(s)).  Studies: Dg Chest 2 View  08/10/2013   CLINICAL DATA:  Wheezing.  EXAM: CHEST  2 VIEW  COMPARISON:  None.  FINDINGS: The heart size and mediastinal contours are within normal limits. Both lungs are clear. The visualized skeletal structures are unremarkable.  IMPRESSION: No active cardiopulmonary disease.   Electronically Signed   By: Signa Kell M.D.   On: 08/10/2013 00:19    Scheduled Meds: . albuterol  2.5 mg Nebulization BID  . docusate sodium  100 mg Oral BID  . enoxaparin (LOVENOX) injection  40 mg Subcutaneous Q24H  .  feeding supplement (ENSURE COMPLETE)  237 mL Oral BID BM  . levofloxacin (LEVAQUIN) IV  750 mg Intravenous Q24H  . methylPREDNISolone (SOLU-MEDROL) injection  40 mg Intravenous Q6H  . montelukast  10 mg Oral Daily  . morphine  15 mg Oral Q12H  . pantoprazole  40 mg Oral Daily  . verapamil  120 mg Oral Daily   Continuous Infusions: . sodium chloride 75 mL/hr at 08/10/13 0730       Time spent: 35 minutes    [facility]  [facility] Pager [phone] If 7PM-7AM, please contact night-coverage at [email or web address], password [facility] 08/10/2013, 10:53 AM  LOS: 1 day

## 2013-08-10 NOTE — ED Provider Notes (Signed)
CSN: [id number]     Arrival date & time 08/09/13  2139 History   First MD Initiated Contact with Patient 08/09/13 2150     Chief Complaint  Patient presents with  . Shortness of Breath   (Consider location/radiation/quality/duration/timing/severity/associated sxs/prior Treatment) HPI 37 year old female with a history of asthma and COPD presents today with respiratory distress. Patient was recently admitted to the hospital with similar symptoms which were initially concerning for pneumonia. Patient did receive inpatient antibiotics and was discharged on 1223. The patient states that she has had difficulty breathing since this morning. She also complains of accompanying chest tightness.  Past Medical History  Diagnosis Date  . Asthma   . Anxiety   . COPD (chronic obstructive pulmonary disease)   . Bronchitis    Past Surgical History  Procedure Laterality Date  . Spinal fusion     History reviewed. No pertinent family history. History  Substance Use Topics  . Smoking status: Current Every Day Smoker  . Smokeless tobacco: Not on file  . Alcohol Use: No   OB History   Grav Para Term Preterm Abortions TAB SAB Ect Mult Living                 Review of Systems  Constitutional: Negative for fever and chills.  HENT: Negative for sore throat.   Eyes: Negative for pain.  Respiratory: Positive for cough, chest tightness, shortness of breath and wheezing.   Cardiovascular: Negative for chest pain.  Gastrointestinal: Negative for nausea, vomiting, abdominal pain and diarrhea.  Genitourinary: Negative for dysuria.  Musculoskeletal: Negative for back pain.  Skin: Negative for rash.  Neurological: Negative for numbness and headaches.    Allergies  Robitussin dm; Nsaids; and Tramadol  Home Medications   Current Outpatient Rx  Name  Route  Sig  Dispense  Refill  . albuterol (PROVENTIL HFA;VENTOLIN HFA) 108 (90 BASE) MCG/ACT inhaler   Inhalation   Inhale 2 puffs into the lungs  every 4 (four) hours as needed for wheezing.   3.7 g   1   . albuterol (PROVENTIL) (5 MG/ML) 0.5% nebulizer solution   Nebulization   Take 0.5 mLs (2.5 mg total) by nebulization every 6 (six) hours as needed for wheezing or shortness of breath.   20 mL   1   . ALPRAZolam (XANAX) 1 MG tablet   Oral   Take 1 mg by mouth 3 (three) times daily.         . benzonatate (TESSALON) 200 MG capsule   Oral   Take 1 capsule (200 mg total) by mouth 3 (three) times daily as needed for cough.   20 capsule   0   . feeding supplement, ENSURE COMPLETE, (ENSURE COMPLETE) LIQD   Oral   Take 237 mLs by mouth 2 (two) times daily between meals.         . Fluticasone-Salmeterol (ADVAIR) 250-50 MCG/DOSE AEPB   Inhalation   Inhale 1 puff into the lungs 2 (two) times daily.         Marland Kitchen guaiFENesin (MUCINEX) 600 MG 12 hr tablet   Oral   Take 1 tablet (600 mg total) by mouth 2 (two) times daily.   30 tablet   0   . levofloxacin (LEVAQUIN) 750 MG tablet   Oral   Take 1 tablet (750 mg total) by mouth daily.   3 tablet   0   . montelukast (SINGULAIR) 10 MG tablet   Oral   Take 10 mg by mouth  daily.          . morphine (MS CONTIN) 15 MG 12 hr tablet   Oral   Take 15 mg by mouth every 12 (twelve) hours.         Marland Kitchen oxyCODONE (ROXICODONE) 15 MG immediate release tablet   Oral   Take 15 mg by mouth 3 (three) times daily as needed for pain (breakthrough).         . predniSONE (STERAPRED UNI-PAK) 10 MG tablet   Oral   Take by mouth daily. Prednisone dosing: Take  Prednisone 40mg  (4 tabs) x 3 days, then taper to 30mg  (3 tabs) x 3 days, then 20mg  (2 tabs) x 3days, then 10mg  (1 tab) x 3days, then OFF.  Dispense:  30 tabs, refills: None   30 tablet   0   . verapamil (CALAN) 120 MG tablet   Oral   Take 120 mg by mouth daily.          Marland Kitchen zolpidem (AMBIEN) 10 MG tablet   Oral   Take 10 mg by mouth at bedtime.          BP 110/75  Pulse 125  Temp(Src) 100.4 F (38 C) (Oral)  Resp  22  SpO2 100%  LMP 07/20/2013 Physical Exam  Constitutional: She is oriented to person, place, and time. She appears well-developed and well-nourished. She appears ill.  HENT:  Head: Normocephalic and atraumatic.  Eyes: Pupils are equal, round, and reactive to light. Right eye exhibits no discharge. Left eye exhibits no discharge.  Neck: Normal range of motion.  Cardiovascular: Normal rate, regular rhythm and normal heart sounds.   Pulmonary/Chest: Tachypnea noted. She has decreased breath sounds. She has wheezes.  Abdominal: Soft. She exhibits no distension. There is no tenderness.  Musculoskeletal: Normal range of motion.  Neurological: She is alert and oriented to person, place, and time.  Skin: Skin is warm. She is not diaphoretic.   ED Course  Procedures (including critical care time) Labs Review Labs Reviewed  CBC - Abnormal; Notable for the following:    RBC 5.15 (*)    MCV 75.3 (*)    MCH 25.0 (*)    RDW 17.4 (*)    All other components within normal limits  BASIC METABOLIC PANEL  POCT I-STAT TROPONIN I   Imaging Review Dg Chest 2 View  08/10/2013   CLINICAL DATA:  Wheezing.  EXAM: CHEST  2 VIEW  COMPARISON:  None.  FINDINGS: The heart size and mediastinal contours are within normal limits. Both lungs are clear. The visualized skeletal structures are unremarkable.  IMPRESSION: No active cardiopulmonary disease.   Electronically Signed   By: Signa Kell M.D.   On: 08/10/2013 00:19    EKG Interpretation   None       MDM   1. Respiratory distress   2. Bronchitis    37 yo F with hx of recent admission for pna, asthma exacerbation, presents again today for respiratory distress.   Patient arrives with tachypnea, diminished breath sounds. Patient with respiratory distress consistent with a repeat has been exacerbation. Patient was recently admitted for similar problems and discharge after 4 days of inpatient which included respiratory treatments and antibiotic  therapies for possible pneumonia. Patient has no evidence of a pneumonia on chest x-ray today patient has no leukocytosis. Will treat as a severe asthma exacerbation. Plan to admit to the hospitalist for continued management. Patient admitted to the hospitalist service in stable condition. Patient seen and evaluated by  myself and my attending, Dr. Oletta Lamas.      Imagene Sheller, MD 08/10/13 [phone]

## 2013-08-10 NOTE — Progress Notes (Signed)
Pt arrived on floor. Alert and oriented. Call light placed within reached. Will continue to monitor.

## 2013-08-10 NOTE — ED Provider Notes (Addendum)
I saw and evaluated the patient, reviewed the resident's note and I agree with the findings and plan.  EKG Interpretation   ECG at time 21:54 shows sinus tachycardia at rate 108, artifact, LVH, by voltage.         Pt with recent admission for resp distress, bronchitis, returns here for same with continued symptoms.  Was tested neg for influenza, continues to have fever, low grade here.  Pt has been on levaquin, steroids, tessalon perles.  Pt here unable to complete full sentences, tachypneic, tachycardic, diffuse exp wheezing, worse on right.  Will give tylenol for fever, continued nebs, IV steroids and consult for re-admission.    Gavin Pound. Oletta Lamas, MD 08/10/13 1191  Gavin Pound. Oletta Lamas, MD 08/10/13 4782

## 2013-08-10 NOTE — Progress Notes (Signed)
INITIAL NUTRITION ASSESSMENT  DOCUMENTATION CODES Per approved criteria  -Obesity Unspecified   INTERVENTION: Provide Ensure Complete BID Encourage PO intake  NUTRITION DIAGNOSIS: Inadequate oral intake related to SOB as evidenced by 2% weight loss in the past week.   Goal: Pt to meet >/= 90% of their estimated nutrition needs   Monitor:  PO intake Weight Labs  Reason for Assessment: MST  37 y.o. female  Admitting Dx: Asthma Exacerbation  ASSESSMENT: 37 y.o. female with hx of chronic back pain on chronic narcotics, hx of asthma, anxiety, just discharged last week for asthma exacerbation on steroid taper, returned to the ER with persistent shortness of breath, incessant coughs, increase back pain, and having arthalgia without myalgia.   Pt seen by RD 12/22 due to poor PO intake and losing weight. Pt has had 10% weight loss in the past 4 months. Pt has lost an additional 3 lbs this past week but, pt reports eating better the past few days. Per nursing notes pt ate 100% of meals today. Pt walking halls with nurse tech at time of visit but, with noticeable difficulty.   Height: Ht Readings from Last 1 Encounters:  08/10/13 5\' 2"  (1.575 m)    Weight: Wt Readings from Last 1 Encounters:  08/10/13 178 lb 9.2 oz (81 kg)    Ideal Body Weight: 110 lbs  % Ideal Body Weight: 162%  Wt Readings from Last 10 Encounters:  08/10/13 178 lb 9.2 oz (81 kg)  08/05/13 181 lb 1.6 oz (82.146 kg)  06/17/13 186 lb 8.2 oz (84.6 kg)  05/04/13 186 lb (84.369 kg)  04/07/13 198 lb (89.812 kg)    Usual Body Weight: 198 lbs  % Usual Body Weight: 90%  BMI:  Body mass index is 32.65 kg/(m^2).  Estimated Nutritional Needs: Kcal: 1700-1900 Protein: 85-95 grams Fluid: 2.4 L/day  Skin: dry, intact  Diet Order: General  EDUCATION NEEDS: -No education needs identified at this time   Intake/Output Summary (Last 24 hours) at 08/10/13 1804 Last data filed at 08/10/13 1357  Gross per 24  hour  Intake    480 ml  Output      0 ml  Net    480 ml    Last BM: 12/27   Labs:   Recent Labs Lab 08/04/13 0530 08/05/13 0746 08/09/13 2201 08/10/13 0315  NA 137 135 135  --   K 3.4* 4.3 4.6  --   CL 100 103 100  --   CO2 14* 18* 22  --   BUN 7 8 11   --   CREATININE 0.71 0.63 0.71 0.80  CALCIUM 9.4 9.4 9.2  --   GLUCOSE 255* 149* 89  --     CBG (last 3)  No results found for this basename: GLUCAP,  in the last 72 hours  Scheduled Meds: . albuterol  2.5 mg Nebulization BID  . docusate sodium  100 mg Oral BID  . enoxaparin (LOVENOX) injection  40 mg Subcutaneous Q24H  . feeding supplement (ENSURE COMPLETE)  237 mL Oral BID BM  . furosemide  40 mg Intravenous Q8H  . guaiFENesin  1,200 mg Oral BID  . levofloxacin (LEVAQUIN) IV  750 mg Intravenous Q24H  . methylPREDNISolone (SOLU-MEDROL) injection  40 mg Intravenous Q6H  . montelukast  10 mg Oral Daily  . morphine  15 mg Oral Q12H  . pantoprazole  40 mg Oral Daily  . verapamil  120 mg Oral Daily    Continuous Infusions:   Past  Medical History  Diagnosis Date  . Asthma   . Anxiety   . COPD (chronic obstructive pulmonary disease)   . Bronchitis     Past Surgical History  Procedure Laterality Date  . Spinal fusion      Ian Malkin RD, LDN Inpatient Clinical Dietitian Pager: [phone] After Hours Pager: [phone]

## 2013-08-11 DIAGNOSIS — J9600 Acute respiratory failure, unspecified whether with hypoxia or hypercapnia: Secondary | ICD-10-CM

## 2013-08-11 DIAGNOSIS — F17200 Nicotine dependence, unspecified, uncomplicated: Secondary | ICD-10-CM

## 2013-08-11 LAB — CBC
MCH: 25.4 pg — ABNORMAL LOW (ref 26.0–34.0)
MCHC: 33 g/dL (ref 30.0–36.0)
MCV: 76.8 fL — ABNORMAL LOW (ref 78.0–100.0)
Platelets: 304 10*3/uL (ref 150–400)
RDW: 17.6 % — ABNORMAL HIGH (ref 11.5–15.5)

## 2013-08-11 LAB — BASIC METABOLIC PANEL
BUN: 10 mg/dL (ref 6–23)
CO2: 23 mEq/L (ref 19–32)
Calcium: 9.4 mg/dL (ref 8.4–10.5)
Creatinine, Ser: 0.74 mg/dL (ref 0.50–1.10)
GFR calc non Af Amer: >90 mL/min (ref >90)
Glucose, Bld: 184 mg/dL — ABNORMAL HIGH (ref 70–99)
Sodium: 133 mEq/L — ABNORMAL LOW (ref 135–145)

## 2013-08-11 MED ORDER — MENTHOL 3 MG MT LOZG
1.0000 | LOZENGE | OROMUCOSAL | Status: DC | PRN
  Administered 2013-08-11 (×2): 3 mg via ORAL
  Filled 2013-08-11: qty 9

## 2013-08-11 NOTE — Progress Notes (Signed)
Nsg Discharge Note  Admit Date:  08/09/2013 Discharge date: 08/11/2013   Julie Kerr to be D/C'd Home per MD order.  AVS completed.  Copy for chart, and copy for patient signed, and dated. Patient/caregiver able to verbalize understanding.  Discharge Medication:   Medication List    STOP taking these medications       predniSONE 10 MG tablet  Commonly known as:  STERAPRED UNI-PAK      TAKE these medications       albuterol 108 (90 BASE) MCG/ACT inhaler  Commonly known as:  PROVENTIL HFA;VENTOLIN HFA  Inhale 2 puffs into the lungs every 4 (four) hours as needed for wheezing.     albuterol (5 MG/ML) 0.5% nebulizer solution  Commonly known as:  PROVENTIL  Take 0.5 mLs (2.5 mg total) by nebulization every 6 (six) hours as needed for wheezing or shortness of breath.     ALPRAZolam 1 MG tablet  Commonly known as:  XANAX  Take 1 mg by mouth 3 (three) times daily.     benzonatate 200 MG capsule  Commonly known as:  TESSALON  Take 1 capsule (200 mg total) by mouth 3 (three) times daily as needed for cough.     feeding supplement (ENSURE COMPLETE) Liqd  Take 237 mLs by mouth 2 (two) times daily between meals.     Fluticasone-Salmeterol 250-50 MCG/DOSE Aepb  Commonly known as:  ADVAIR  Inhale 1 puff into the lungs 2 (two) times daily.     guaiFENesin 600 MG 12 hr tablet  Commonly known as:  MUCINEX  Take 1 tablet (600 mg total) by mouth 2 (two) times daily.     levofloxacin 750 MG tablet  Commonly known as:  LEVAQUIN  Take 1 tablet (750 mg total) by mouth daily.     morphine 15 MG 12 hr tablet  Commonly known as:  MS CONTIN  Take 15 mg by mouth every 12 (twelve) hours.     oxyCODONE 15 MG immediate release tablet  Commonly known as:  ROXICODONE  Take 15 mg by mouth 3 (three) times daily as needed for pain (breakthrough).     SINGULAIR 10 MG tablet  Generic drug:  montelukast  Take 10 mg by mouth daily.     verapamil 120 MG tablet  Commonly known as:  CALAN   Take 120 mg by mouth daily.     zolpidem 10 MG tablet  Commonly known as:  AMBIEN  Take 10 mg by mouth at bedtime.        Discharge Assessment: Filed Vitals:   08/11/13 0959  BP: 132/77  Pulse:   Temp:   Resp:    Skin clean, dry and intact without evidence of skin break down, no evidence of skin tears noted. IV catheter discontinued intact. Site without signs and symptoms of complications - no redness or edema noted at insertion site, patient denies c/o pain - only slight tenderness at site.  Dressing with slight pressure applied.  D/c Instructions-Education: Discharge instructions given to patient/family with verbalized understanding. D/c education completed with patient/family including follow up instructions, medication list, d/c activities limitations if indicated, with other d/c instructions as indicated by MD - patient able to verbalize understanding, all questions fully answered. Patient instructed to return to ED, call 911, or call MD for any changes in condition.  Patient escorted via WC, and D/C home via private auto.  Kern Reap, RN 08/11/2013 12:16 PM

## 2013-08-11 NOTE — Discharge Summary (Signed)
Physician Discharge Summary  Julie Kerr YQM:[id number] DOB: 1975-09-02 DOA: 08/09/2013  PCP: Lu Duffel, MD  Admit date: 08/09/2013 Discharge date: 08/11/2013  Time spent: 40 minutes  Recommendations for Outpatient Follow-up:  1. Followup with pulmonologist on 08/12/2013.  Discharge Diagnoses:  Principal Problem:   Acute respiratory failure Active Problems:   Tobacco use disorder   Chronic pain syndrome   Asthma exacerbation   Anemia   Discharge Condition: Stable  Diet recommendation: Heart healthy diet  Filed Weights   08/10/13 0227  Weight: 81 kg (178 lb 9.2 oz)    History of present illness:  Julie Kerr is an 37 y.o. female with hx of chronic back pain on chronic narcotics, hx of asthma, anxiety, just discharged last week for asthma exacerbation on steroid taper, returned to the ER with persistent shortness of breath, incessant coughs, increase back pain, and having arthalgia without myalgia. She has no fever or chills, and has no chest pain. She denied lower extremity weaknss. She said she was compliance with her steroid, but was exposed to second hand smokes. Evaluation in the ER included a CXR which showed no PNA, WBC of 13K, normal renal Fx tests, and BS of 151. Hospitalist was asked to admit her for now status asthmaticus.  Hospital Course:   1. Acute severe asthma: Patient has history of acute respiratory/ventilatory failure, she was intubated 3 times previously. Came in to the hospital with cough, wheezing and shortness of breath. She was recently in the hospital for asthma exacerbation and she was discharged home with a steroids taper as well as antibiotics. This time she had fever so she was also treated for acute bronchitis, started on IV steroids and bronchodilators. Supportive management with mucolytics, 2 doses and oxygen as needed. Patient did well, she is not wheezing since yesterday I have asked her not to take any steroids until she sees her  pulmonologist.  2. Acute bronchitis: Patient reported fever, she spiked a temperature of 100.4 the emergency department, started on IV antibiotics. Levaquin was discontinued.  3. Chronic back pain/narcotic dependence: Patient is on MS Contin 15 mg twice a day and OxyIR 15 mg as needed every 8 hours. This was continued throughout the hospital stay.  4. Tobacco abuse disorder: Counseled extensively patient still smokes.  5. Lower extremity pain: Patient was complaining about pain in her lower extremities, admitting physician sent hemoglobin electrophoresis to rule out sickle cell, this is pending. I have asked her to take vitamin D and calcium supplements.  Procedures:  None  Consultations:  None  Discharge Exam: Filed Vitals:   08/11/13 0959  BP: 132/77  Pulse:   Temp:   Resp:    General: Alert and awake, oriented x3, not in any acute distress. HEENT: anicteric sclera, pupils reactive to light and accommodation, EOMI CVS: S1-S2 clear, no murmur rubs or gallops Chest: clear to auscultation bilaterally, no wheezing, rales or rhonchi Abdomen: soft nontender, nondistended, normal bowel sounds, no organomegaly Extremities: no cyanosis, clubbing or edema noted bilaterally Neuro: Cranial nerves II-XII intact, no focal neurological deficits  Discharge Instructions  Discharge Orders   Future Orders Complete By Expires   Diet - low sodium heart healthy  As directed    Increase activity slowly  As directed        Medication List    STOP taking these medications       predniSONE 10 MG tablet  Commonly known as:  STERAPRED UNI-PAK      TAKE these medications  albuterol 108 (90 BASE) MCG/ACT inhaler  Commonly known as:  PROVENTIL HFA;VENTOLIN HFA  Inhale 2 puffs into the lungs every 4 (four) hours as needed for wheezing.     albuterol (5 MG/ML) 0.5% nebulizer solution  Commonly known as:  PROVENTIL  Take 0.5 mLs (2.5 mg total) by nebulization every 6 (six) hours as  needed for wheezing or shortness of breath.     ALPRAZolam 1 MG tablet  Commonly known as:  XANAX  Take 1 mg by mouth 3 (three) times daily.     benzonatate 200 MG capsule  Commonly known as:  TESSALON  Take 1 capsule (200 mg total) by mouth 3 (three) times daily as needed for cough.     feeding supplement (ENSURE COMPLETE) Liqd  Take 237 mLs by mouth 2 (two) times daily between meals.     Fluticasone-Salmeterol 250-50 MCG/DOSE Aepb  Commonly known as:  ADVAIR  Inhale 1 puff into the lungs 2 (two) times daily.     guaiFENesin 600 MG 12 hr tablet  Commonly known as:  MUCINEX  Take 1 tablet (600 mg total) by mouth 2 (two) times daily.     levofloxacin 750 MG tablet  Commonly known as:  LEVAQUIN  Take 1 tablet (750 mg total) by mouth daily.     morphine 15 MG 12 hr tablet  Commonly known as:  MS CONTIN  Take 15 mg by mouth every 12 (twelve) hours.     oxyCODONE 15 MG immediate release tablet  Commonly known as:  ROXICODONE  Take 15 mg by mouth 3 (three) times daily as needed for pain (breakthrough).     SINGULAIR 10 MG tablet  Generic drug:  montelukast  Take 10 mg by mouth daily.     verapamil 120 MG tablet  Commonly known as:  CALAN  Take 120 mg by mouth daily.     zolpidem 10 MG tablet  Commonly known as:  AMBIEN  Take 10 mg by mouth at bedtime.       Allergies  Allergen Reactions  . Robitussin Dm [Dextromethorphan-Guaifenesin] Nausea And Vomiting  . Nsaids Hives  . Tramadol Hives      The results of significant diagnostics from this hospitalization (including imaging, microbiology, ancillary and laboratory) are listed below for reference.    Significant Diagnostic Studies: Dg Chest 2 View  08/10/2013   CLINICAL DATA:  Wheezing.  EXAM: CHEST  2 VIEW  COMPARISON:  None.  FINDINGS: The heart size and mediastinal contours are within normal limits. Both lungs are clear. The visualized skeletal structures are unremarkable.  IMPRESSION: No active cardiopulmonary  disease.   Electronically Signed   By: Signa Kell M.D.   On: 08/10/2013 00:19   Dg Chest Portable 1 View  08/03/2013   CLINICAL DATA:  Shortness of breath, cough, wheezing  EXAM: PORTABLE CHEST - 1 VIEW  COMPARISON:  06/14/2013  FINDINGS: Lungs are clear.  No pleural effusion or pneumothorax.  Cardiomegaly.  IMPRESSION: No evidence of acute cardiopulmonary disease.   Electronically Signed   By: Charline Bills M.D.   On: 08/03/2013 21:22    Microbiology: No results found for this or any previous visit (from the past 240 hour(s)).   Labs: Basic Metabolic Panel:  Recent Labs Lab 08/05/13 0746 08/09/13 2201 08/10/13 0315 08/11/13 0720  NA 135 135  --  133*  K 4.3 4.6  --  4.2  CL 103 100  --  96  CO2 18* 22  --  23  GLUCOSE 149* 89  --  184*  BUN 8 11  --  10  CREATININE 0.63 0.71 0.80 0.74  CALCIUM 9.4 9.2  --  9.4   Liver Function Tests: No results found for this basename: AST, ALT, ALKPHOS, BILITOT, PROT, ALBUMIN,  in the last 168 hours No results found for this basename: LIPASE, AMYLASE,  in the last 168 hours No results found for this basename: AMMONIA,  in the last 168 hours CBC:  Recent Labs Lab 08/05/13 0746 08/06/13 0315 08/09/13 2201 08/10/13 0315 08/11/13 0720  WBC 32.6* 29.8* 8.1 13.2* 13.4*  HGB 10.1* 10.9* 12.9 12.3 11.6*  HCT 30.8* 33.5* 38.8 37.4 35.1*  MCV 77.8* 78.5 75.3* 76.8* 76.8*  PLT 320 358 339 297 304   Cardiac Enzymes: No results found for this basename: CKTOTAL, CKMB, CKMBINDEX, TROPONINI,  in the last 168 hours BNP: BNP (last 3 results)  Recent Labs  06/14/13 1610  PROBNP 26.1   CBG:  Recent Labs Lab 08/05/13 1139 08/05/13 1704 08/05/13 2027 08/06/13 0731 08/06/13 1117  GLUCAP 164* 206* 134* 156* 151*       Signed:  Piccola Arico A  [facility] 08/11/2013, 10:29 AM

## 2013-08-12 NOTE — Progress Notes (Signed)
UR completed. Digby Groeneveld RN CCM Case Mgmt phone [phone] 

## 2013-08-13 LAB — HEMOGLOBINOPATHY EVALUATION
Hemoglobin Other: 0 %
Hgb A: 97.8 % (ref 96.8–97.8)
Hgb F Quant: 0 % (ref 0.0–2.0)
Hgb S Quant: 0 %

## 2013-09-02 ENCOUNTER — Encounter (HOSPITAL_COMMUNITY): Payer: Self-pay | Admitting: Emergency Medicine

## 2013-09-02 ENCOUNTER — Emergency Department (HOSPITAL_COMMUNITY): Payer: Medicaid Other

## 2013-09-02 ENCOUNTER — Inpatient Hospital Stay (HOSPITAL_COMMUNITY)
Admission: EM | Admit: 2013-09-02 | Discharge: 2013-09-03 | DRG: 190 | Disposition: A | Discharge status: Home / Self Care | Payer: Medicaid Other | Attending: Internal Medicine | Admitting: Internal Medicine

## 2013-09-02 DIAGNOSIS — Y92009 Unspecified place in unspecified non-institutional (private) residence as the place of occurrence of the external cause: Secondary | ICD-10-CM

## 2013-09-02 DIAGNOSIS — Z79899 Other long term (current) drug therapy: Secondary | ICD-10-CM

## 2013-09-02 DIAGNOSIS — F411 Generalized anxiety disorder: Secondary | ICD-10-CM | POA: Diagnosis present

## 2013-09-02 DIAGNOSIS — A419 Sepsis, unspecified organism: Secondary | ICD-10-CM

## 2013-09-02 DIAGNOSIS — J45901 Unspecified asthma with (acute) exacerbation: Secondary | ICD-10-CM

## 2013-09-02 DIAGNOSIS — IMO0002 Reserved for concepts with insufficient information to code with codable children: Secondary | ICD-10-CM

## 2013-09-02 DIAGNOSIS — Z981 Arthrodesis status: Secondary | ICD-10-CM

## 2013-09-02 DIAGNOSIS — Z888 Allergy status to other drugs, medicaments and biological substances status: Secondary | ICD-10-CM

## 2013-09-02 DIAGNOSIS — T3995XA Adverse effect of unspecified nonopioid analgesic, antipyretic and antirheumatic, initial encounter: Secondary | ICD-10-CM | POA: Diagnosis present

## 2013-09-02 DIAGNOSIS — J441 Chronic obstructive pulmonary disease with (acute) exacerbation: Principal | ICD-10-CM

## 2013-09-02 DIAGNOSIS — G894 Chronic pain syndrome: Secondary | ICD-10-CM

## 2013-09-02 DIAGNOSIS — F17200 Nicotine dependence, unspecified, uncomplicated: Secondary | ICD-10-CM

## 2013-09-02 DIAGNOSIS — J9601 Acute respiratory failure with hypoxia: Secondary | ICD-10-CM | POA: Diagnosis present

## 2013-09-02 DIAGNOSIS — D649 Anemia, unspecified: Secondary | ICD-10-CM

## 2013-09-02 DIAGNOSIS — J9600 Acute respiratory failure, unspecified whether with hypoxia or hypercapnia: Secondary | ICD-10-CM

## 2013-09-02 DIAGNOSIS — Z886 Allergy status to analgesic agent status: Secondary | ICD-10-CM

## 2013-09-02 LAB — POCT I-STAT 3, ART BLOOD GAS (G3+)
Acid-base deficit: 1 mmol/L (ref 0.0–2.0)
Bicarbonate: 23.6 mEq/L (ref 20.0–24.0)
O2 SAT: 71 %
Patient temperature: 98.7
TCO2: 25 mmol/L (ref 0–100)
pCO2 arterial: 36.5 mmHg (ref 35.0–45.0)
pH, Arterial: 7.419 (ref 7.350–7.450)
pO2, Arterial: 37 mmHg — CL (ref 80.0–100.0)

## 2013-09-02 LAB — CBC WITH DIFFERENTIAL/PLATELET
Basophils Absolute: 0 10*3/uL (ref 0.0–0.1)
Basophils Relative: 0 % (ref 0–1)
EOS ABS: 0 10*3/uL (ref 0.0–0.7)
EOS PCT: 0 % (ref 0–5)
HCT: 37.5 % (ref 36.0–46.0)
HEMOGLOBIN: 12.4 g/dL (ref 12.0–15.0)
LYMPHS ABS: 3.1 10*3/uL (ref 0.7–4.0)
Lymphocytes Relative: 27 % (ref 12–46)
MCH: 25.3 pg — ABNORMAL LOW (ref 26.0–34.0)
MCHC: 33.1 g/dL (ref 30.0–36.0)
MCV: 76.5 fL — AB (ref 78.0–100.0)
MONOS PCT: 5 % (ref 3–12)
Monocytes Absolute: 0.5 10*3/uL (ref 0.1–1.0)
NEUTROS PCT: 69 % (ref 43–77)
Neutro Abs: 8.1 10*3/uL — ABNORMAL HIGH (ref 1.7–7.7)
Platelets: 507 10*3/uL — ABNORMAL HIGH (ref 150–400)
RBC: 4.9 MIL/uL (ref 3.87–5.11)
RDW: 18.1 % — ABNORMAL HIGH (ref 11.5–15.5)
WBC: 11.8 10*3/uL — ABNORMAL HIGH (ref 4.0–10.5)

## 2013-09-02 LAB — BASIC METABOLIC PANEL
BUN: 10 mg/dL (ref 6–23)
CO2: 21 mEq/L (ref 19–32)
Calcium: 10.4 mg/dL (ref 8.4–10.5)
Chloride: 103 mEq/L (ref 96–112)
Creatinine, Ser: 0.78 mg/dL (ref 0.50–1.10)
GFR calc Af Amer: >90 mL/min (ref >90)
Glucose, Bld: 107 mg/dL — ABNORMAL HIGH (ref 70–99)
POTASSIUM: 4.5 meq/L (ref 3.7–5.3)
Sodium: 141 mEq/L (ref 137–147)

## 2013-09-02 LAB — CG4 I-STAT (LACTIC ACID): Lactic Acid, Venous: 2.04 mmol/L (ref 0.5–2.2)

## 2013-09-02 MED ORDER — LEVOFLOXACIN IN D5W 500 MG/100ML IV SOLN
500.0000 mg | INTRAVENOUS | Status: DC
  Administered 2013-09-03: 500 mg via INTRAVENOUS
  Filled 2013-09-02 (×2): qty 100

## 2013-09-02 MED ORDER — METHYLPREDNISOLONE SODIUM SUCC 125 MG IJ SOLR
125.0000 mg | Freq: Once | INTRAMUSCULAR | Status: AC
  Administered 2013-09-02: 125 mg via INTRAVENOUS
  Filled 2013-09-02: qty 2

## 2013-09-02 MED ORDER — GUAIFENESIN ER 600 MG PO TB12
600.0000 mg | ORAL_TABLET | Freq: Two times a day (BID) | ORAL | Status: DC
  Administered 2013-09-03 (×2): 600 mg via ORAL
  Filled 2013-09-02 (×3): qty 1

## 2013-09-02 MED ORDER — BENZONATATE 100 MG PO CAPS
200.0000 mg | ORAL_CAPSULE | Freq: Three times a day (TID) | ORAL | Status: DC | PRN
  Filled 2013-09-02: qty 2

## 2013-09-02 MED ORDER — IPRATROPIUM BROMIDE 0.02 % IN SOLN
0.5000 mg | Freq: Once | RESPIRATORY_TRACT | Status: AC
  Administered 2013-09-02: 0.5 mg via RESPIRATORY_TRACT
  Filled 2013-09-02: qty 2.5

## 2013-09-02 MED ORDER — LORAZEPAM 2 MG/ML IJ SOLN: 0.5000 mg | Freq: Once | INTRAMUSCULAR | Status: DC

## 2013-09-02 MED ORDER — ONDANSETRON HCL 4 MG/2ML IJ SOLN: 4.0000 mg | Freq: Four times a day (QID) | INTRAMUSCULAR | Status: DC | PRN

## 2013-09-02 MED ORDER — ZOLPIDEM TARTRATE 5 MG PO TABS
10.0000 mg | ORAL_TABLET | Freq: Every day | ORAL | Status: DC
  Administered 2013-09-03: 10 mg via ORAL
  Filled 2013-09-02: qty 2

## 2013-09-02 MED ORDER — MORPHINE SULFATE 4 MG/ML IJ SOLN
4.0000 mg | Freq: Once | INTRAMUSCULAR | Status: AC
  Administered 2013-09-02: 4 mg via INTRAVENOUS
  Filled 2013-09-02: qty 1

## 2013-09-02 MED ORDER — PREDNISONE 20 MG PO TABS: 60.0000 mg | ORAL_TABLET | Freq: Once | ORAL | Status: DC

## 2013-09-02 MED ORDER — OXYCODONE HCL 5 MG PO TABS
15.0000 mg | ORAL_TABLET | ORAL | Status: DC | PRN
  Administered 2013-09-03: 15 mg via ORAL
  Filled 2013-09-02: qty 3

## 2013-09-02 MED ORDER — ENSURE COMPLETE PO LIQD
237.0000 mL | Freq: Two times a day (BID) | ORAL | Status: DC
  Administered 2013-09-03 (×2): 237 mL via ORAL

## 2013-09-02 MED ORDER — ENOXAPARIN SODIUM 40 MG/0.4ML ~~LOC~~ SOLN
40.0000 mg | SUBCUTANEOUS | Status: DC
  Administered 2013-09-03: 40 mg via SUBCUTANEOUS
  Filled 2013-09-02 (×2): qty 0.4

## 2013-09-02 MED ORDER — SODIUM CHLORIDE 0.9 % IJ SOLN: 3.0000 mL | Freq: Two times a day (BID) | INTRAMUSCULAR | Status: DC

## 2013-09-02 MED ORDER — ALPRAZOLAM 0.5 MG PO TABS
1.0000 mg | ORAL_TABLET | Freq: Three times a day (TID) | ORAL | Status: DC
  Administered 2013-09-03 (×3): 1 mg via ORAL
  Filled 2013-09-02 (×3): qty 2

## 2013-09-02 MED ORDER — MORPHINE SULFATE ER 15 MG PO TBCR
15.0000 mg | EXTENDED_RELEASE_TABLET | Freq: Two times a day (BID) | ORAL | Status: DC
  Administered 2013-09-03 (×2): 15 mg via ORAL
  Filled 2013-09-02 (×2): qty 1

## 2013-09-02 MED ORDER — VERAPAMIL HCL 120 MG PO TABS
120.0000 mg | ORAL_TABLET | Freq: Every day | ORAL | Status: DC
  Administered 2013-09-03: 120 mg via ORAL
  Filled 2013-09-02: qty 1

## 2013-09-02 MED ORDER — ONDANSETRON HCL 4 MG PO TABS
4.0000 mg | ORAL_TABLET | Freq: Four times a day (QID) | ORAL | Status: DC | PRN
Start: 2013-09-02 — End: 2013-09-03

## 2013-09-02 MED ORDER — ACETAMINOPHEN 325 MG PO TABS: 650.0000 mg | ORAL_TABLET | Freq: Four times a day (QID) | ORAL | Status: DC | PRN

## 2013-09-02 MED ORDER — SODIUM CHLORIDE 0.9 % IJ SOLN
3.0000 mL | Freq: Two times a day (BID) | INTRAMUSCULAR | Status: DC
  Administered 2013-09-03: 3 mL via INTRAVENOUS
  Administered 2013-09-03: 11:00:00 via INTRAVENOUS

## 2013-09-02 MED ORDER — CYCLOBENZAPRINE HCL 10 MG PO TABS
10.0000 mg | ORAL_TABLET | Freq: Three times a day (TID) | ORAL | Status: DC | PRN
  Administered 2013-09-03: 10 mg via ORAL
  Filled 2013-09-02: qty 1

## 2013-09-02 MED ORDER — PANTOPRAZOLE SODIUM 40 MG PO TBEC
40.0000 mg | DELAYED_RELEASE_TABLET | Freq: Every day | ORAL | Status: DC
  Administered 2013-09-03: 40 mg via ORAL
  Filled 2013-09-02: qty 1

## 2013-09-02 MED ORDER — IPRATROPIUM BROMIDE 0.02 % IN SOLN
0.5000 mg | Freq: Four times a day (QID) | RESPIRATORY_TRACT | Status: DC
  Administered 2013-09-03 (×3): 0.5 mg via RESPIRATORY_TRACT
  Filled 2013-09-02 (×3): qty 2.5

## 2013-09-02 MED ORDER — ACETAMINOPHEN 650 MG RE SUPP
650.0000 mg | Freq: Four times a day (QID) | RECTAL | Status: DC | PRN
Start: 2013-09-02 — End: 2013-09-03

## 2013-09-02 MED ORDER — LORAZEPAM 2 MG/ML IJ SOLN
1.0000 mg | Freq: Once | INTRAMUSCULAR | Status: AC
  Administered 2013-09-02: 1 mg via INTRAVENOUS
  Filled 2013-09-02: qty 1

## 2013-09-02 MED ORDER — MAGNESIUM SULFATE 40 MG/ML IJ SOLN
2.0000 g | Freq: Once | INTRAMUSCULAR | Status: AC
Start: 2013-09-02 — End: 2013-09-02
  Administered 2013-09-02: 2 g via INTRAVENOUS
  Filled 2013-09-02: qty 50

## 2013-09-02 MED ORDER — BUDESONIDE 0.25 MG/2ML IN SUSP
0.2500 mg | Freq: Two times a day (BID) | RESPIRATORY_TRACT | Status: DC
  Administered 2013-09-03: 0.25 mg via RESPIRATORY_TRACT
  Filled 2013-09-02 (×4): qty 2

## 2013-09-02 MED ORDER — LEVALBUTEROL HCL 0.63 MG/3ML IN NEBU: 0.6300 mg | INHALATION_SOLUTION | Freq: Four times a day (QID) | RESPIRATORY_TRACT | Status: DC | PRN

## 2013-09-02 MED ORDER — LEVALBUTEROL HCL 0.63 MG/3ML IN NEBU
0.6300 mg | INHALATION_SOLUTION | Freq: Four times a day (QID) | RESPIRATORY_TRACT | Status: DC
Start: 2013-09-03 — End: 2013-09-03
  Administered 2013-09-03 (×3): 0.63 mg via RESPIRATORY_TRACT
  Filled 2013-09-02 (×7): qty 3

## 2013-09-02 MED ORDER — ALBUTEROL SULFATE (2.5 MG/3ML) 0.083% IN NEBU
5.0000 mg | INHALATION_SOLUTION | Freq: Once | RESPIRATORY_TRACT | Status: AC
  Administered 2013-09-02: 5 mg via RESPIRATORY_TRACT
  Filled 2013-09-02: qty 6

## 2013-09-02 MED ORDER — ALBUTEROL SULFATE (2.5 MG/3ML) 0.083% IN NEBU
10.0000 mg | INHALATION_SOLUTION | Freq: Once | RESPIRATORY_TRACT | Status: AC
  Administered 2013-09-02: 10 mg via RESPIRATORY_TRACT
  Filled 2013-09-02: qty 12

## 2013-09-02 MED ORDER — METHYLPREDNISOLONE SODIUM SUCC 40 MG IJ SOLR
40.0000 mg | Freq: Two times a day (BID) | INTRAMUSCULAR | Status: DC
  Administered 2013-09-03 (×2): 40 mg via INTRAVENOUS
  Filled 2013-09-02 (×3): qty 1

## 2013-09-02 MED ORDER — MONTELUKAST SODIUM 10 MG PO TABS
10.0000 mg | ORAL_TABLET | Freq: Every day | ORAL | Status: DC
  Administered 2013-09-03: 10 mg via ORAL
  Filled 2013-09-02: qty 1

## 2013-09-02 MED ORDER — ALBUTEROL SULFATE (2.5 MG/3ML) 0.083% IN NEBU
10.0000 mg | INHALATION_SOLUTION | Freq: Once | RESPIRATORY_TRACT | Status: AC
  Administered 2013-09-02: 10 mg via RESPIRATORY_TRACT

## 2013-09-02 MED ORDER — ONDANSETRON HCL 4 MG/2ML IJ SOLN: 4.0000 mg | Freq: Three times a day (TID) | INTRAMUSCULAR | Status: AC | PRN

## 2013-09-02 MED ORDER — ALBUTEROL SULFATE (2.5 MG/3ML) 0.083% IN NEBU: 10.0000 mg | INHALATION_SOLUTION | Freq: Once | RESPIRATORY_TRACT | Status: DC

## 2013-09-02 NOTE — ED Provider Notes (Signed)
CSN: [id number]     Arrival date & time 09/02/13  1825 History   First MD Initiated Contact with Patient 09/02/13 1830     Chief complaint: Asthma  (Consider location/radiation/quality/duration/timing/severity/associated sxs/prior Treatment) The history is provided by the patient. The history is limited by the condition of the patient (Severely dyspneic).   38 year old female with history of asthma states that her just a little tight yesterday and she's had severe difficulty with her asthma today. She is used for breathing treatments and she's not getting any relief. There is a nonproductive cough. She states she's had fevers up to 103.0 at home. Of note, she has required intubation on 3 separate occasions.  Past Medical History  Diagnosis Date  . Asthma   . Anxiety   . COPD (chronic obstructive pulmonary disease)   . Bronchitis    Past Surgical History  Procedure Laterality Date  . Spinal fusion     No family history on file. History  Substance Use Topics  . Smoking status: Current Every Day Smoker  . Smokeless tobacco: Not on file  . Alcohol Use: No   OB History   Grav Para Term Preterm Abortions TAB SAB Ect Mult Living                 Review of Systems  Unable to perform ROS: Severe respiratory distress    Allergies  Robitussin dm; Nsaids; and Tramadol  Home Medications   Current Outpatient Rx  Name  Route  Sig  Dispense  Refill  . albuterol (PROVENTIL HFA;VENTOLIN HFA) 108 (90 BASE) MCG/ACT inhaler   Inhalation   Inhale 2 puffs into the lungs every 4 (four) hours as needed for wheezing.   3.7 g   1   . albuterol (PROVENTIL) (5 MG/ML) 0.5% nebulizer solution   Nebulization   Take 0.5 mLs (2.5 mg total) by nebulization every 6 (six) hours as needed for wheezing or shortness of breath.   20 mL   1   . ALPRAZolam (XANAX) 1 MG tablet   Oral   Take 1 mg by mouth 3 (three) times daily.         . benzonatate (TESSALON) 200 MG capsule   Oral   Take 1 capsule  (200 mg total) by mouth 3 (three) times daily as needed for cough.   20 capsule   0   . feeding supplement, ENSURE COMPLETE, (ENSURE COMPLETE) LIQD   Oral   Take 237 mLs by mouth 2 (two) times daily between meals.         . Fluticasone-Salmeterol (ADVAIR) 250-50 MCG/DOSE AEPB   Inhalation   Inhale 1 puff into the lungs 2 (two) times daily.         Marland Kitchen guaiFENesin (MUCINEX) 600 MG 12 hr tablet   Oral   Take 1 tablet (600 mg total) by mouth 2 (two) times daily.   30 tablet   0   . levofloxacin (LEVAQUIN) 750 MG tablet   Oral   Take 1 tablet (750 mg total) by mouth daily.   3 tablet   0   . montelukast (SINGULAIR) 10 MG tablet   Oral   Take 10 mg by mouth daily.          Marland Kitchen morphine (MS CONTIN) 15 MG 12 hr tablet   Oral   Take 15 mg by mouth every 12 (twelve) hours.         Marland Kitchen oxyCODONE (ROXICODONE) 15 MG immediate release tablet   Oral  Take 15 mg by mouth 3 (three) times daily as needed for pain (breakthrough).         . verapamil (CALAN) 120 MG tablet   Oral   Take 120 mg by mouth daily.          Marland Kitchen zolpidem (AMBIEN) 10 MG tablet   Oral   Take 10 mg by mouth at bedtime.          BP 142/96  Pulse 112  Temp(Src) 98.7 F (37.1 C)  Resp 26  SpO2 100%  LMP 07/20/2013 Physical Exam  Nursing note and vitals reviewed.  38 year old female, in moderate to severe respiratory distress. Vital signs are significant for tachypnea with respiratory rate of 26, tachycardia with heart rate 112, and hypertension with blood pressure 142/96. Oxygen saturation is 100%, which is normal. Head is normocephalic and atraumatic. PERRLA, EOMI. Oropharynx is clear. Neck is nontender and supple without adenopathy or JVD. Back is nontender and there is no CVA tenderness. Lungs have diffuse infiltrate expiratory wheezes. She is using accessory muscles of respiration. Chest is nontender. Heart has regular rate and rhythm without murmur. Abdomen is soft, flat, nontender without  masses or hepatosplenomegaly and peristalsis is normoactive. Extremities have no cyanosis or edema, full range of motion is present. Skin is warm and dry without rash. Neurologic: Mental status is normal, cranial nerves are intact, there are no motor or sensory deficits.  ED Course  Procedures (including critical care time) Labs Review Results for orders placed during the hospital encounter of 09/02/13  CBC WITH DIFFERENTIAL      Result Value Range   WBC 11.8 (*) 4.0 - 10.5 K/uL   RBC 4.90  3.87 - 5.11 MIL/uL   Hemoglobin 12.4  12.0 - 15.0 g/dL   HCT 16.1  09.6 - 04.5 %   MCV 76.5 (*) 78.0 - 100.0 fL   MCH 25.3 (*) 26.0 - 34.0 pg   MCHC 33.1  30.0 - 36.0 g/dL   RDW 40.9 (*) 81.1 - 91.4 %   Platelets 507 (*) 150 - 400 K/uL   Neutrophils Relative % 69  43 - 77 %   Neutro Abs 8.1 (*) 1.7 - 7.7 K/uL   Lymphocytes Relative 27  12 - 46 %   Lymphs Abs 3.1  0.7 - 4.0 K/uL   Monocytes Relative 5  3 - 12 %   Monocytes Absolute 0.5  0.1 - 1.0 K/uL   Eosinophils Relative 0  0 - 5 %   Eosinophils Absolute 0.0  0.0 - 0.7 K/uL   Basophils Relative 0  0 - 1 %   Basophils Absolute 0.0  0.0 - 0.1 K/uL  BASIC METABOLIC PANEL      Result Value Range   Sodium 141  137 - 147 mEq/L   Potassium 4.5  3.7 - 5.3 mEq/L   Chloride 103  96 - 112 mEq/L   CO2 21  19 - 32 mEq/L   Glucose, Bld 107 (*) 70 - 99 mg/dL   BUN 10  6 - 23 mg/dL   Creatinine, Ser 7.82  0.50 - 1.10 mg/dL   Calcium 95.6  8.4 - 21.3 mg/dL   GFR calc non Af Amer >90  >90 mL/min   GFR calc Af Amer >90  >90 mL/min  CG4 I-STAT (LACTIC ACID)      Result Value Range   Lactic Acid, Venous 2.04  0.5 - 2.2 mmol/L  POCT I-STAT 3, BLOOD GAS (G3+)  Result Value Range   pH, Arterial 7.419  7.350 - 7.450   pCO2 arterial 36.5  35.0 - 45.0 mmHg   pO2, Arterial 37.0 (*) 80.0 - 100.0 mmHg   Bicarbonate 23.6  20.0 - 24.0 mEq/L   TCO2 25  0 - 100 mmol/L   O2 Saturation 71.0     Acid-base deficit 1.0  0.0 - 2.0 mmol/L   Patient temperature  98.7 F     Collection site RADIAL, ALLEN'S TEST ACCEPTABLE     Drawn by RT     Sample type ARTERIAL     Comment NOTIFIED PHYSICIAN     Imaging Review Dg Chest Port 1 View  09/02/2013   CLINICAL DATA:  Shortness of breath.  EXAM: PORTABLE CHEST - 1 VIEW  COMPARISON:  DG CHEST 2 VIEW dated 08/09/2013  FINDINGS: The heart size and mediastinal contours are within normal limits. Both lungs are clear. The visualized skeletal structures are unremarkable.  IMPRESSION: No active disease.   Electronically Signed   By: Charlett NoseKevin  Dover M.D.   On: 09/02/2013 19:05   CRITICAL CARE Performed by: ZOXWR,UEAVWGLICK,Eyob Godlewski Total critical care time: 65 minutes Critical care time was exclusive of separately billable procedures and treating other patients. Critical care was necessary to treat or prevent imminent or life-threatening deterioration. Critical care was time spent personally by me on the following activities: development of treatment plan with patient and/or surrogate as well as nursing, discussions with consultants, evaluation of patient's response to treatment, examination of patient, obtaining history from patient or surrogate, ordering and performing treatments and interventions, ordering and review of laboratory studies, ordering and review of radiographic studies, pulse oximetry and re-evaluation of patient's condition.  MDM  No diagnosis found. Acute exacerbation of asthma. She is started immediately on BiPAP as well as given albuterol with ipratropium via nebulizer and also methylprednisolone intravenously. She is also given a dose of magnesium intravenously. Old records are reviewed and she had been hospitalized 3 weeks ago although she did not need intubation at that time.  Following initial nebulizer treatment and being placed on BiPAP, she seemed to improve. She is also very anxious and was given a small dose of lorazepam. She was taken off BiPAP and seemed to be tolerating appears to be getting more anxious.  She states she felt better on BiPAP. She's given additional albuterol with ipratropium. ABG obtained was venous but shows that she is not retaining CO2. At this point, I do not feel she will need intubation. Case is discussed with Dr. Toniann FailKakrakandy of [facility] who agrees to admit the patient.  Dione Boozeavid Burke Terry, MD 09/02/13 2229

## 2013-09-02 NOTE — ED Notes (Signed)
[place]; pt's spouse [phone]

## 2013-09-02 NOTE — H&P (Addendum)
[facility] History and Physical  Julie Schildermanda Hokenson ZOX:[id number] DOB: 06/09/1976 DOA: 09/02/2013  Referring physician: ER physician. PCP: Lu DuffelWELLS,WENDELL, MD  Chief Complaint: Shortness of breath.  HPI: Julie Kerr is a 38 y.o. female with history of bronchial asthma and tobacco abuse and chronic pain presents to the ER because of worsening shortness of breath for last 24 hours. Patient has been recently in the hospital twice in the last one month with asthma exacerbation. Patient states that her shortness of breath started last night with productive cough and subjective feeling of fever and chills. Since the symptoms got worse she came to the ER. In the ER patient was found to be hypoxic with bilateral expiratory wheeze. Patient was placed on BiPAP and was started on nebulizer and steroids. Chest x-ray does not show any acute infiltrates. Patient has recurrent severe cough while examining. Patient otherwise denies any chest pain palpitations nausea vomiting abdominal pain diarrhea. Patient's ABG does not show any carbon dioxide retention. In the ER patient has been afebrile but patient states she was having a fever of 103F at her house. Patient will be admitted for further management.   Review of Systems: As presented in the history of presenting illness, rest negative.  Past Medical History  Diagnosis Date  . Asthma   . Anxiety   . COPD (chronic obstructive pulmonary disease)   . Bronchitis    Past Surgical History  Procedure Laterality Date  . Spinal fusion     Social History:  reports that she has been smoking.  She has never used smokeless tobacco. She reports that she does not drink alcohol. Her drug history is not on file. Where does patient live home. Can patient participate in ADLs? Yes.  Allergies  Allergen Reactions  . Robitussin Dm [Dextromethorphan-Guaifenesin] Nausea And Vomiting  . Nsaids Hives  . Tramadol Hives    Family History: History reviewed. No pertinent  family history.    Prior to Admission medications   Medication Sig Start Date End Date Taking? Authorizing Provider  albuterol (PROVENTIL HFA;VENTOLIN HFA) 108 (90 BASE) MCG/ACT inhaler Inhale 2 puffs into the lungs every 4 (four) hours as needed for wheezing. 08/06/13  Yes Maryann Mikhail, DO  albuterol (PROVENTIL) (5 MG/ML) 0.5% nebulizer solution Take 0.5 mLs (2.5 mg total) by nebulization every 6 (six) hours as needed for wheezing or shortness of breath. 08/06/13  Yes Maryann Mikhail, DO  ALPRAZolam (XANAX) 1 MG tablet Take 1 mg by mouth 3 (three) times daily.   Yes Historical Provider, MD  benzonatate (TESSALON) 200 MG capsule Take 1 capsule (200 mg total) by mouth 3 (three) times daily as needed for cough. 08/06/13  Yes Maryann Mikhail, DO  cyclobenzaprine (FLEXERIL) 10 MG tablet Take 10 mg by mouth 3 (three) times daily as needed for muscle spasms.   Yes Historical Provider, MD  feeding supplement, ENSURE COMPLETE, (ENSURE COMPLETE) LIQD Take 237 mLs by mouth 2 (two) times daily between meals. 08/06/13  Yes Maryann Mikhail, DO  Fluticasone-Salmeterol (ADVAIR) 250-50 MCG/DOSE AEPB Inhale 1 puff into the lungs 2 (two) times daily. 06/18/13  Yes Elease EtienneAnand D Hongalgi, MD  guaiFENesin (MUCINEX) 600 MG 12 hr tablet Take 1 tablet (600 mg total) by mouth 2 (two) times daily. 06/18/13  Yes Elease EtienneAnand D Hongalgi, MD  ibuprofen (ADVIL,MOTRIN) 200 MG tablet Take 200 mg by mouth every 6 (six) hours as needed for moderate pain.   Yes Historical Provider, MD  levofloxacin (LEVAQUIN) 750 MG tablet Take 1 tablet (750 mg total) by  mouth daily. 08/06/13  Yes Maryann Mikhail, DO  montelukast (SINGULAIR) 10 MG tablet Take 10 mg by mouth daily.    Yes Historical Provider, MD  morphine (MS CONTIN) 15 MG 12 hr tablet Take 15 mg by mouth every 12 (twelve) hours.   Yes Historical Provider, MD  oxyCODONE (ROXICODONE) 15 MG immediate release tablet Take 15 mg by mouth every 4 (four) hours as needed for pain.   Yes Historical  Provider, MD  Phenylephrine-Pheniramine-DM [facility] COLD & COUGH PO) Take 30 mLs by mouth daily as needed (for cold).   Yes Historical Provider, MD  verapamil (CALAN) 120 MG tablet Take 120 mg by mouth daily.    Yes Historical Provider, MD  zolpidem (AMBIEN) 10 MG tablet Take 10 mg by mouth at bedtime.   Yes Historical Provider, MD    Physical Exam: Filed Vitals:   09/02/13 2122 09/02/13 2154 09/02/13 2237 09/02/13 2245  BP: 99/56 115/64 100/86 123/55  Pulse:  136  132  Temp: 98.8 F (37.1 C)     TempSrc: Oral     Resp: 16 25  29   SpO2: 98% 100%  100%     General:  Well-developed and nourished.  Eyes: Anicteric no pallor.  ENT: No discharge from the ears eyes nose mouth.  Neck: No mass felt. JVD not appreciated.  Cardiovascular: S1-S2 heard. Tachycardic.  Respiratory: Bilateral expiratory wheeze heard. No crepitations.  Abdomen: Soft nontender bowel sounds present. No guarding rigidity.  Skin: No rash.  Musculoskeletal: No edema.  Psychiatric: Appears normal.  Neurologic: Alert awake oriented to time place and person. Moves all extremities.  Labs on Admission:  Basic Metabolic Panel:  Recent Labs Lab 09/02/13 1840  NA 141  K 4.5  CL 103  CO2 21  GLUCOSE 107*  BUN 10  CREATININE 0.78  CALCIUM 10.4   Liver Function Tests: No results found for this basename: AST, ALT, ALKPHOS, BILITOT, PROT, ALBUMIN,  in the last 168 hours No results found for this basename: LIPASE, AMYLASE,  in the last 168 hours No results found for this basename: AMMONIA,  in the last 168 hours CBC:  Recent Labs Lab 09/02/13 1840  WBC 11.8*  NEUTROABS 8.1*  HGB 12.4  HCT 37.5  MCV 76.5*  PLT 507*   Cardiac Enzymes: No results found for this basename: CKTOTAL, CKMB, CKMBINDEX, TROPONINI,  in the last 168 hours  BNP (last 3 results)  Recent Labs  06/14/13 1610  PROBNP 26.1   CBG: No results found for this basename: GLUCAP,  in the last 168 hours  Radiological Exams  on Admission: Dg Chest Port 1 View  09/02/2013   CLINICAL DATA:  Shortness of breath.  EXAM: PORTABLE CHEST - 1 VIEW  COMPARISON:  DG CHEST 2 VIEW dated 08/09/2013  FINDINGS: The heart size and mediastinal contours are within normal limits. Both lungs are clear. The visualized skeletal structures are unremarkable.  IMPRESSION: No active disease.   Electronically Signed   By: Charlett Nose M.D.   On: 09/02/2013 19:05     Assessment/Plan Principal Problem:   Acute respiratory failure Active Problems:   Tobacco use disorder   Chronic pain syndrome   Asthma exacerbation   1. Acute respiratory failure probably secondary to asthma exacerbation with possible vocal cord dysfunction component - for now continue with BiPAP. I have placed patient on Levaquin as patient has productive cough. Check influenza PCR and we'll empirically treat for flu. Continue with IV Solu-Medrol Pulmicort and nebulizers. At this time  we will closely observe overnight in step down as patient has had previous history of intubation. I have discontinued patient's NSAIDs. We'll also check BNP. 2. Chronic pain syndrome - continue home dose of pain medications and will place patient on as needed when necessary pain meds. Check urine drug screen. I have discontinued NSAIDs due to asthma. 3. Tobacco abuse - patient states she has recently quit tobacco abuse. 4. Sinus tachycardia - probably secondary to #1 reason. Check EKG and BNP.  I have reviewed patient's old chart and compared old labs.    Code Status: Full code.  Family Communication: None.  Disposition Plan: Admit to inpatient.    Jameriah Trotti N. [facility] Pager [phone].  If 7PM-7AM, please contact night-coverage [email or web address] Password [email or web address] 09/02/2013, 11:20 PM

## 2013-09-02 NOTE — Progress Notes (Signed)
38yo female w/ h/o asthma c/o her home breathing tx not helping her SOB, CXR negative, to begin IV ABX for bronchitis.  Will start Levaquin 500mg  IV Q24H for CrCl ~100 ml/min and monitor CBC, Cx, clinical progression.  Vernard GamblesVeronda Benjy Kana, PharmD, BCPS 09/02/2013 11:27 PM

## 2013-09-02 NOTE — ED Notes (Signed)
Hx of asthma.  Breathing tx not helping.

## 2013-09-02 NOTE — ED Notes (Signed)
Patient taken off BiPap and placed on hour long nebulizer treatment

## 2013-09-02 NOTE — ED Notes (Signed)
Patient is tearful and states she is in 10/10 pain. MD is aware.

## 2013-09-03 DIAGNOSIS — F17200 Nicotine dependence, unspecified, uncomplicated: Secondary | ICD-10-CM

## 2013-09-03 LAB — CBC WITH DIFFERENTIAL/PLATELET
BASOS ABS: 0 10*3/uL (ref 0.0–0.1)
Basophils Relative: 0 % (ref 0–1)
Eosinophils Absolute: 0 10*3/uL (ref 0.0–0.7)
Eosinophils Relative: 0 % (ref 0–5)
HCT: 34.2 % — ABNORMAL LOW (ref 36.0–46.0)
Hemoglobin: 10.8 g/dL — ABNORMAL LOW (ref 12.0–15.0)
LYMPHS PCT: 2 % — AB (ref 12–46)
Lymphs Abs: 0.3 10*3/uL — ABNORMAL LOW (ref 0.7–4.0)
MCH: 24.7 pg — ABNORMAL LOW (ref 26.0–34.0)
MCHC: 31.6 g/dL (ref 30.0–36.0)
MCV: 78.1 fL (ref 78.0–100.0)
Monocytes Absolute: 0.1 10*3/uL (ref 0.1–1.0)
Monocytes Relative: 1 % — ABNORMAL LOW (ref 3–12)
NEUTROS ABS: 14.2 10*3/uL — AB (ref 1.7–7.7)
Neutrophils Relative %: 97 % — ABNORMAL HIGH (ref 43–77)
Platelets: 410 10*3/uL — ABNORMAL HIGH (ref 150–400)
RBC: 4.38 MIL/uL (ref 3.87–5.11)
RDW: 18.3 % — AB (ref 11.5–15.5)
WBC: 14.7 10*3/uL — AB (ref 4.0–10.5)

## 2013-09-03 LAB — COMPREHENSIVE METABOLIC PANEL
ALK PHOS: 61 U/L (ref 39–117)
ALT: 8 U/L (ref 0–35)
AST: 9 U/L (ref 0–37)
Albumin: 3.8 g/dL (ref 3.5–5.2)
BILIRUBIN TOTAL: 0.2 mg/dL — AB (ref 0.3–1.2)
BUN: 12 mg/dL (ref 6–23)
CHLORIDE: 99 meq/L (ref 96–112)
CO2: 15 mEq/L — ABNORMAL LOW (ref 19–32)
Calcium: 9.5 mg/dL (ref 8.4–10.5)
Creatinine, Ser: 0.81 mg/dL (ref 0.50–1.10)
GFR calc non Af Amer: >90 mL/min (ref >90)
GLUCOSE: 328 mg/dL — AB (ref 70–99)
POTASSIUM: 3.3 meq/L — AB (ref 3.7–5.3)
Sodium: 138 mEq/L (ref 137–147)
Total Protein: 7.8 g/dL (ref 6.0–8.3)

## 2013-09-03 LAB — PREGNANCY, URINE: Preg Test, Ur: NEGATIVE

## 2013-09-03 LAB — INFLUENZA PANEL BY PCR (TYPE A & B)
H1N1 flu by pcr: NOT DETECTED
Influenza A By PCR: NEGATIVE
Influenza B By PCR: NEGATIVE

## 2013-09-03 LAB — CBC
HEMATOCRIT: 32.8 % — AB (ref 36.0–46.0)
HEMOGLOBIN: 10.6 g/dL — AB (ref 12.0–15.0)
MCH: 24.7 pg — ABNORMAL LOW (ref 26.0–34.0)
MCHC: 32.3 g/dL (ref 30.0–36.0)
MCV: 76.5 fL — ABNORMAL LOW (ref 78.0–100.0)
Platelets: 401 10*3/uL — ABNORMAL HIGH (ref 150–400)
RBC: 4.29 MIL/uL (ref 3.87–5.11)
RDW: 18.4 % — ABNORMAL HIGH (ref 11.5–15.5)
WBC: 13.6 10*3/uL — ABNORMAL HIGH (ref 4.0–10.5)

## 2013-09-03 LAB — CREATININE, SERUM
CREATININE: 0.91 mg/dL (ref 0.50–1.10)
GFR calc non Af Amer: 81 mL/min — ABNORMAL LOW (ref >90)

## 2013-09-03 LAB — TSH: TSH: 0.167 u[IU]/mL — ABNORMAL LOW (ref 0.350–4.500)

## 2013-09-03 LAB — RAPID URINE DRUG SCREEN, HOSP PERFORMED
Amphetamines: NOT DETECTED
BARBITURATES: NOT DETECTED
BENZODIAZEPINES: NOT DETECTED
COCAINE: NOT DETECTED
Opiates: POSITIVE — AB
Tetrahydrocannabinol: NOT DETECTED

## 2013-09-03 LAB — MRSA PCR SCREENING: MRSA by PCR: NEGATIVE

## 2013-09-03 LAB — PRO B NATRIURETIC PEPTIDE: Pro B Natriuretic peptide (BNP): <5 pg/mL (ref 0–125)

## 2013-09-03 MED ORDER — PREDNISONE 50 MG PO TABS
60.0000 mg | ORAL_TABLET | Freq: Every day | ORAL | Status: DC
  Filled 2013-09-03: qty 1

## 2013-09-03 MED ORDER — PREDNISONE 20 MG PO TABS: 40.0000 mg | ORAL_TABLET | ORAL | Status: DC

## 2013-09-03 MED ORDER — POTASSIUM CHLORIDE CRYS ER 20 MEQ PO TBCR
40.0000 meq | EXTENDED_RELEASE_TABLET | Freq: Once | ORAL | Status: AC
  Administered 2013-09-03: 40 meq via ORAL
  Filled 2013-09-03: qty 2

## 2013-09-03 MED ORDER — MORPHINE SULFATE 4 MG/ML IJ SOLN: 4.0000 mg | Freq: Once | INTRAMUSCULAR | Status: DC

## 2013-09-03 MED ORDER — OSELTAMIVIR PHOSPHATE 75 MG PO CAPS
75.0000 mg | ORAL_CAPSULE | Freq: Two times a day (BID) | ORAL | Status: DC
  Administered 2013-09-03: 75 mg via ORAL
  Filled 2013-09-03 (×2): qty 1

## 2013-09-03 MED ORDER — FENTANYL CITRATE 0.05 MG/ML IJ SOLN
25.0000 ug | INTRAMUSCULAR | Status: DC | PRN
  Administered 2013-09-03 (×5): 25 ug via INTRAVENOUS
  Filled 2013-09-03 (×6): qty 2

## 2013-09-03 NOTE — Plan of Care (Signed)
K 3.3 -Kdur x 1 tab ordered.  Junious SilkAllison Ellis, ANP

## 2013-09-03 NOTE — Progress Notes (Signed)
Patient ambulated along the hallways, denies SOB or wheezing.Complained of pain to back and legs.Pain medicine given per order 

## 2013-09-03 NOTE — Progress Notes (Signed)
Pt discharged home per DR Rizwan instructions.DC information given to patient and patient verbalized understanding.Patient escorted out by Lynnae SandhoffWally Raynolds RN via wheelchair.

## 2013-09-03 NOTE — Progress Notes (Deleted)
Patient ambulated along the hallways, denies SOB or wheezing.Complained of pain to back and legs.Pain medicine given per order

## 2013-09-03 NOTE — Progress Notes (Signed)
Physician Discharge Summary  Julie Kerr [id number] DOB: 06/30/1976 DOA: 09/02/2013  PCP: Lu DuffelWELLS,WENDELL, MD  Admit date: 09/02/2013 Discharge date: 09/03/2013  Time spent: >45 minutes  Recommendations for Outpatient Follow-up:  1. Pulm f/u in 1-2 wks  Discharge Diagnoses:  Principal Problem:   Acute respiratory failure/ Asthma exacerbation Active Problems: Allergic reaction to NSAIDS   Tobacco use disorder   Chronic pain syndrome     Discharge Condition: stable  Diet recommendation: heart healthy  Filed Weights   09/02/13 2300 09/03/13 0400  Weight: 81 kg (178 lb 9.2 oz) 81 kg (178 lb 9.2 oz)    History of present illness:  Julie Kerr is a 38 y.o. female with history of bronchial asthma and tobacco abuse and chronic pain presents to the ER because of worsening shortness of breath for last 24 hours.  She had vent dependant respiratory failure in 06/2013. She was re-admitted in 07/2013 for an asthma flare and URI and discharged with a Prednisone taper and Levaquin  Patient states that her shortness of breath started last night with productive cough and subjective feeling of fever and chills. Since the symptoms got worse she came to the ER. In the ER patient was found to be hypoxic with bilateral expiratory wheeze. Patient was placed on BiPAP and was started on nebulizer and steroids. Chest x-ray does not show any acute infiltrates. Patient has recurrent severe cough while examining. Patient otherwise denies any chest pain palpitations nausea vomiting abdominal pain diarrhea. Patient's ABG does not show any carbon dioxide retention. In the ER patient has been afebrile but patient states she was having a fever of 103F at her house.   Hospital Course:  Acute respiratory failure- COPD exacerbation and Acute bronchitis  -  likley asthma exacerbation in setting of Ibuprofen use- she tells me that she had headache but no Tylenol at home. She was given an Ibuprofen by her neighbor  which usually causes exacerbation of her asthma along with other symptoms of rash and swelling (which were not present).  - now off of O2 with pulse ox of 99% at rest and with exertion - stable to d/c home with Predinose an Levaquin (she has 5 Levaquin left from few wks ago when she did not finish her course for "infection in her neck glands")   Chronic pain syndrome  - pt c/o pain in hips/ knees and ankles - cont home pain medications upon d/c  Tobacco abuse  - patient states she has recently quit tobacco abuse.  Sinus tachycardia  - probably secondary to respiratory distress- improved  HTN?? - currently hypotensive- d/c Verapamil   Procedures:  none  Consultations:  none  Discharge Exam: Filed Vitals:   09/03/13 1150  BP: 105/52  Pulse: 96  Temp: 99 F (37.2 C)  Resp: 19    General: AAO x 3 Cardiovascular: RRR, no murmurs - HR in 90-110 range Respiratory: CTA b/l - good air entry  Discharge Instructions      Discharge Orders   Future Orders Complete By Expires   Diet - low sodium heart healthy  As directed    Increase activity slowly  As directed        Medication List    STOP taking these medications       ibuprofen 200 MG tablet  Commonly known as:  ADVIL,MOTRIN     verapamil 120 MG tablet  Commonly known as:  CALAN      TAKE these medications  albuterol 108 (90 BASE) MCG/ACT inhaler  Commonly known as:  PROVENTIL HFA;VENTOLIN HFA  Inhale 2 puffs into the lungs every 4 (four) hours as needed for wheezing.     albuterol (5 MG/ML) 0.5% nebulizer solution  Commonly known as:  PROVENTIL  Take 0.5 mLs (2.5 mg total) by nebulization every 6 (six) hours as needed for wheezing or shortness of breath.     ALPRAZolam 1 MG tablet  Commonly known as:  XANAX  Take 1 mg by mouth 3 (three) times daily.     benzonatate 200 MG capsule  Commonly known as:  TESSALON  Take 1 capsule (200 mg total) by mouth 3 (three) times daily as needed for cough.      cyclobenzaprine 10 MG tablet  Commonly known as:  FLEXERIL  Take 10 mg by mouth 3 (three) times daily as needed for muscle spasms.     feeding supplement (ENSURE COMPLETE) Liqd  Take 237 mLs by mouth 2 (two) times daily between meals.     Fluticasone-Salmeterol 250-50 MCG/DOSE Aepb  Commonly known as:  ADVAIR  Inhale 1 puff into the lungs 2 (two) times daily.     guaiFENesin 600 MG 12 hr tablet  Commonly known as:  MUCINEX  Take 1 tablet (600 mg total) by mouth 2 (two) times daily.     levofloxacin 750 MG tablet  Commonly known as:  LEVAQUIN  Take 1 tablet (750 mg total) by mouth daily.     morphine 15 MG 12 hr tablet  Commonly known as:  MS CONTIN  Take 15 mg by mouth every 12 (twelve) hours.     oxyCODONE 15 MG immediate release tablet  Commonly known as:  ROXICODONE  Take 15 mg by mouth every 4 (four) hours as needed for pain.     predniSONE 20 MG tablet  Commonly known as:  DELTASONE  Take 2 tablets (40 mg total) by mouth daily.     SINGULAIR 10 MG tablet  Generic drug:  montelukast  Take 10 mg by mouth daily.     THERAFLU COLD & COUGH PO  Take 30 mLs by mouth daily as needed (for cold).     zolpidem 10 MG tablet  Commonly known as:  AMBIEN  Take 10 mg by mouth at bedtime.       Allergies  Allergen Reactions  . Robitussin Dm [Dextromethorphan-Guaifenesin] Nausea And Vomiting  . Nsaids Hives  . Tramadol Hives      The results of significant diagnostics from this hospitalization (including imaging, microbiology, ancillary and laboratory) are listed below for reference.    Significant Diagnostic Studies: Dg Chest 2 View  08/10/2013   CLINICAL DATA:  Wheezing.  EXAM: CHEST  2 VIEW  COMPARISON:  None.  FINDINGS: The heart size and mediastinal contours are within normal limits. Both lungs are clear. The visualized skeletal structures are unremarkable.  IMPRESSION: No active cardiopulmonary disease.   Electronically Signed   By: Signa Kell M.D.   On:  08/10/2013 00:19   Dg Chest Port 1 View  09/02/2013   CLINICAL DATA:  Shortness of breath.  EXAM: PORTABLE CHEST - 1 VIEW  COMPARISON:  DG CHEST 2 VIEW dated 08/09/2013  FINDINGS: The heart size and mediastinal contours are within normal limits. Both lungs are clear. The visualized skeletal structures are unremarkable.  IMPRESSION: No active disease.   Electronically Signed   By: Charlett Nose M.D.   On: 09/02/2013 19:05    Microbiology: Recent Results (from the  past 240 hour(s))  MRSA PCR SCREENING     Status: None   Collection Time    09/03/13 12:05 AM      Result Value Range Status   MRSA by PCR NEGATIVE  NEGATIVE Final   Comment:            The GeneXpert MRSA Assay (FDA     approved for NASAL specimens     only), is one component of a     comprehensive MRSA colonization     surveillance program. It is not     intended to diagnose MRSA     infection nor to guide or     monitor treatment for     MRSA infections.     Labs: Basic Metabolic Panel:  Recent Labs Lab 09/02/13 1840 09/02/13 2358 09/03/13 0235  NA 141  --  138  K 4.5  --  3.3*  CL 103  --  99  CO2 21  --  15*  GLUCOSE 107*  --  328*  BUN 10  --  12  CREATININE 0.78 0.91 0.81  CALCIUM 10.4  --  9.5   Liver Function Tests:  Recent Labs Lab 09/03/13 0235  AST 9  ALT 8  ALKPHOS 61  BILITOT 0.2*  PROT 7.8  ALBUMIN 3.8   No results found for this basename: LIPASE, AMYLASE,  in the last 168 hours No results found for this basename: AMMONIA,  in the last 168 hours CBC:  Recent Labs Lab 09/02/13 1840 09/02/13 2358 09/03/13 0235  WBC 11.8* 13.6* 14.7*  NEUTROABS 8.1*  --  14.2*  HGB 12.4 10.6* 10.8*  HCT 37.5 32.8* 34.2*  MCV 76.5* 76.5* 78.1  PLT 507* 401* 410*   Cardiac Enzymes: No results found for this basename: CKTOTAL, CKMB, CKMBINDEX, TROPONINI,  in the last 168 hours BNP: BNP (last 3 results)  Recent Labs  06/14/13 1610 09/02/13 2358  PROBNP 26.1 <5.0   CBG: No results found  for this basename: GLUCAP,  in the last 168 hours     Signed:  Calvert Cantor, MD  [facility] 09/03/2013, 4:51 PM

## 2013-09-06 ENCOUNTER — Inpatient Hospital Stay (HOSPITAL_COMMUNITY)
Admission: EM | Admit: 2013-09-06 | Discharge: 2013-09-11 | DRG: 190 | Disposition: A | Discharge status: Home Health | Payer: Medicaid Other | Attending: Internal Medicine | Admitting: Internal Medicine

## 2013-09-06 ENCOUNTER — Emergency Department (HOSPITAL_COMMUNITY): Payer: Medicaid Other

## 2013-09-06 DIAGNOSIS — R7309 Other abnormal glucose: Secondary | ICD-10-CM | POA: Diagnosis present

## 2013-09-06 DIAGNOSIS — A419 Sepsis, unspecified organism: Secondary | ICD-10-CM

## 2013-09-06 DIAGNOSIS — J45901 Unspecified asthma with (acute) exacerbation: Secondary | ICD-10-CM

## 2013-09-06 DIAGNOSIS — J441 Chronic obstructive pulmonary disease with (acute) exacerbation: Principal | ICD-10-CM | POA: Diagnosis present

## 2013-09-06 DIAGNOSIS — G589 Mononeuropathy, unspecified: Secondary | ICD-10-CM | POA: Diagnosis not present

## 2013-09-06 DIAGNOSIS — F411 Generalized anxiety disorder: Secondary | ICD-10-CM | POA: Diagnosis present

## 2013-09-06 DIAGNOSIS — R0600 Dyspnea, unspecified: Secondary | ICD-10-CM

## 2013-09-06 DIAGNOSIS — J069 Acute upper respiratory infection, unspecified: Secondary | ICD-10-CM | POA: Diagnosis present

## 2013-09-06 DIAGNOSIS — J383 Other diseases of vocal cords: Secondary | ICD-10-CM

## 2013-09-06 DIAGNOSIS — D649 Anemia, unspecified: Secondary | ICD-10-CM

## 2013-09-06 DIAGNOSIS — Z981 Arthrodesis status: Secondary | ICD-10-CM

## 2013-09-06 DIAGNOSIS — B9789 Other viral agents as the cause of diseases classified elsewhere: Secondary | ICD-10-CM | POA: Diagnosis present

## 2013-09-06 DIAGNOSIS — G894 Chronic pain syndrome: Secondary | ICD-10-CM | POA: Diagnosis present

## 2013-09-06 DIAGNOSIS — F17200 Nicotine dependence, unspecified, uncomplicated: Secondary | ICD-10-CM

## 2013-09-06 DIAGNOSIS — J9600 Acute respiratory failure, unspecified whether with hypoxia or hypercapnia: Secondary | ICD-10-CM | POA: Diagnosis present

## 2013-09-06 DIAGNOSIS — J9690 Respiratory failure, unspecified, unspecified whether with hypoxia or hypercapnia: Secondary | ICD-10-CM

## 2013-09-06 DIAGNOSIS — K219 Gastro-esophageal reflux disease without esophagitis: Secondary | ICD-10-CM | POA: Diagnosis present

## 2013-09-06 DIAGNOSIS — T380X5A Adverse effect of glucocorticoids and synthetic analogues, initial encounter: Secondary | ICD-10-CM | POA: Diagnosis present

## 2013-09-06 LAB — BASIC METABOLIC PANEL
BUN: 14 mg/dL (ref 6–23)
CO2: 22 meq/L (ref 19–32)
Calcium: 9.6 mg/dL (ref 8.4–10.5)
Chloride: 103 mEq/L (ref 96–112)
Creatinine, Ser: 0.68 mg/dL (ref 0.50–1.10)
GFR calc Af Amer: >90 mL/min (ref >90)
GFR calc non Af Amer: >90 mL/min (ref >90)
Glucose, Bld: 121 mg/dL — ABNORMAL HIGH (ref 70–99)
Potassium: 4.4 mEq/L (ref 3.7–5.3)
SODIUM: 140 meq/L (ref 137–147)

## 2013-09-06 LAB — CREATININE, SERUM
CREATININE: 0.68 mg/dL (ref 0.50–1.10)
GFR calc Af Amer: >90 mL/min (ref >90)
GFR calc non Af Amer: >90 mL/min (ref >90)

## 2013-09-06 LAB — CBC
HCT: 32 % — ABNORMAL LOW (ref 36.0–46.0)
HEMOGLOBIN: 10.6 g/dL — AB (ref 12.0–15.0)
MCH: 25 pg — ABNORMAL LOW (ref 26.0–34.0)
MCHC: 33.1 g/dL (ref 30.0–36.0)
MCV: 75.5 fL — ABNORMAL LOW (ref 78.0–100.0)
Platelets: 351 10*3/uL (ref 150–400)
RBC: 4.24 MIL/uL (ref 3.87–5.11)
RDW: 17.4 % — ABNORMAL HIGH (ref 11.5–15.5)
WBC: 16.5 10*3/uL — ABNORMAL HIGH (ref 4.0–10.5)

## 2013-09-06 LAB — POCT I-STAT 3, ART BLOOD GAS (G3+)
Acid-base deficit: 1 mmol/L (ref 0.0–2.0)
BICARBONATE: 22.6 meq/L (ref 20.0–24.0)
O2 Saturation: 100 %
PCO2 ART: 32.2 mmHg — AB (ref 35.0–45.0)
PH ART: 7.455 — AB (ref 7.350–7.450)
TCO2: 24 mmol/L (ref 0–100)
pO2, Arterial: 369 mmHg — ABNORMAL HIGH (ref 80.0–100.0)

## 2013-09-06 LAB — CBC WITH DIFFERENTIAL/PLATELET
BASOS PCT: 0 % (ref 0–1)
Basophils Absolute: 0 10*3/uL (ref 0.0–0.1)
EOS ABS: 0 10*3/uL (ref 0.0–0.7)
Eosinophils Relative: 0 % (ref 0–5)
HCT: 37.3 % (ref 36.0–46.0)
Hemoglobin: 12.5 g/dL (ref 12.0–15.0)
LYMPHS ABS: 3.3 10*3/uL (ref 0.7–4.0)
Lymphocytes Relative: 33 % (ref 12–46)
MCH: 25.5 pg — AB (ref 26.0–34.0)
MCHC: 33.5 g/dL (ref 30.0–36.0)
MCV: 76 fL — ABNORMAL LOW (ref 78.0–100.0)
Monocytes Absolute: 0.6 10*3/uL (ref 0.1–1.0)
Monocytes Relative: 6 % (ref 3–12)
NEUTROS PCT: 60 % (ref 43–77)
Neutro Abs: 5.9 10*3/uL (ref 1.7–7.7)
PLATELETS: 422 10*3/uL — AB (ref 150–400)
RBC: 4.91 MIL/uL (ref 3.87–5.11)
RDW: 17.7 % — AB (ref 11.5–15.5)
WBC: 9.8 10*3/uL (ref 4.0–10.5)

## 2013-09-06 LAB — TROPONIN I: Troponin I: <0.3 ng/mL (ref <0.30)

## 2013-09-06 LAB — MRSA PCR SCREENING: MRSA BY PCR: NEGATIVE

## 2013-09-06 LAB — GLUCOSE, CAPILLARY
GLUCOSE-CAPILLARY: 185 mg/dL — AB (ref 70–99)
GLUCOSE-CAPILLARY: 278 mg/dL — AB (ref 70–99)

## 2013-09-06 MED ORDER — ALBUTEROL SULFATE (2.5 MG/3ML) 0.083% IN NEBU
INHALATION_SOLUTION | RESPIRATORY_TRACT | Status: AC
  Administered 2013-09-06: 2.5 mg
  Filled 2013-09-06: qty 6

## 2013-09-06 MED ORDER — SALINE SPRAY 0.65 % NA SOLN
2.0000 | Freq: Four times a day (QID) | NASAL | Status: DC
  Administered 2013-09-06 – 2013-09-11 (×15): 2 via NASAL
  Filled 2013-09-06: qty 44

## 2013-09-06 MED ORDER — INSULIN ASPART 100 UNIT/ML ~~LOC~~ SOLN
0.0000 [IU] | SUBCUTANEOUS | Status: DC
  Administered 2013-09-06: 8 [IU] via SUBCUTANEOUS
  Administered 2013-09-06: 3 [IU] via SUBCUTANEOUS
  Administered 2013-09-07: 8 [IU] via SUBCUTANEOUS
  Administered 2013-09-07 (×3): 3 [IU] via SUBCUTANEOUS
  Administered 2013-09-07: 8 [IU] via SUBCUTANEOUS
  Administered 2013-09-08: 3 [IU] via SUBCUTANEOUS
  Administered 2013-09-08: 2 [IU] via SUBCUTANEOUS
  Administered 2013-09-08 – 2013-09-09 (×4): 3 [IU] via SUBCUTANEOUS
  Administered 2013-09-09 (×3): 2 [IU] via SUBCUTANEOUS
  Administered 2013-09-09: 3 [IU] via SUBCUTANEOUS
  Administered 2013-09-09: 2 [IU] via SUBCUTANEOUS

## 2013-09-06 MED ORDER — IPRATROPIUM BROMIDE 0.02 % IN SOLN
1.0000 mg | Freq: Once | RESPIRATORY_TRACT | Status: AC
  Administered 2013-09-06: 1 mg via RESPIRATORY_TRACT

## 2013-09-06 MED ORDER — ALBUTEROL (5 MG/ML) CONTINUOUS INHALATION SOLN
15.0000 mg/h | INHALATION_SOLUTION | Freq: Once | RESPIRATORY_TRACT | Status: AC
  Administered 2013-09-06: 15 mg/h via RESPIRATORY_TRACT

## 2013-09-06 MED ORDER — FENTANYL CITRATE 0.05 MG/ML IJ SOLN
25.0000 ug | Freq: Once | INTRAMUSCULAR | Status: AC
  Administered 2013-09-06: 25 ug via INTRAVENOUS
  Filled 2013-09-06: qty 2

## 2013-09-06 MED ORDER — LEVOFLOXACIN IN D5W 750 MG/150ML IV SOLN: 750.0000 mg | INTRAVENOUS | Status: DC

## 2013-09-06 MED ORDER — OXYMETAZOLINE HCL 0.05 % NA SOLN
1.0000 | Freq: Two times a day (BID) | NASAL | Status: AC
  Administered 2013-09-06 – 2013-09-10 (×7): 1 via NASAL
  Filled 2013-09-06: qty 15

## 2013-09-06 MED ORDER — METOCLOPRAMIDE HCL 5 MG/ML IJ SOLN
5.0000 mg | Freq: Four times a day (QID) | INTRAMUSCULAR | Status: DC
  Administered 2013-09-06 – 2013-09-09 (×10): 5 mg via INTRAVENOUS
  Filled 2013-09-06 (×14): qty 1

## 2013-09-06 MED ORDER — METHYLPREDNISOLONE SODIUM SUCC 125 MG IJ SOLR
60.0000 mg | Freq: Four times a day (QID) | INTRAMUSCULAR | Status: DC
Start: 2013-09-06 — End: 2013-09-08
  Administered 2013-09-06 – 2013-09-08 (×6): 60 mg via INTRAVENOUS
  Filled 2013-09-06 (×9): qty 0.96
  Filled 2013-09-06: qty 2
  Filled 2013-09-06: qty 0.96

## 2013-09-06 MED ORDER — MONTELUKAST SODIUM 10 MG PO TABS
10.0000 mg | ORAL_TABLET | Freq: Every day | ORAL | Status: DC
  Administered 2013-09-07 – 2013-09-11 (×5): 10 mg via ORAL
  Filled 2013-09-06 (×5): qty 1

## 2013-09-06 MED ORDER — FENTANYL CITRATE 0.05 MG/ML IJ SOLN: 50.0000 ug | Freq: Once | INTRAMUSCULAR | Status: DC

## 2013-09-06 MED ORDER — OSELTAMIVIR PHOSPHATE 75 MG PO CAPS
75.0000 mg | ORAL_CAPSULE | Freq: Two times a day (BID) | ORAL | Status: DC
Start: 2013-09-06 — End: 2013-09-08
  Administered 2013-09-06 – 2013-09-07 (×3): 75 mg via ORAL
  Filled 2013-09-06 (×6): qty 1

## 2013-09-06 MED ORDER — METHYLPREDNISOLONE SODIUM SUCC 125 MG IJ SOLR
125.0000 mg | Freq: Once | INTRAMUSCULAR | Status: AC
  Administered 2013-09-06: 125 mg via INTRAVENOUS
  Filled 2013-09-06: qty 2

## 2013-09-06 MED ORDER — FAMOTIDINE IN NACL 20-0.9 MG/50ML-% IV SOLN
20.0000 mg | Freq: Every day | INTRAVENOUS | Status: DC
  Administered 2013-09-06 – 2013-09-08 (×3): 20 mg via INTRAVENOUS
  Filled 2013-09-06 (×4): qty 50

## 2013-09-06 MED ORDER — MORPHINE SULFATE 2 MG/ML IJ SOLN
1.0000 mg | INTRAMUSCULAR | Status: DC | PRN
  Administered 2013-09-06 – 2013-09-07 (×5): 2 mg via INTRAVENOUS
  Administered 2013-09-07: 1 mg via INTRAVENOUS
  Administered 2013-09-07 – 2013-09-09 (×13): 2 mg via INTRAVENOUS
  Filled 2013-09-06 (×19): qty 1

## 2013-09-06 MED ORDER — PROMETHAZINE-CODEINE 6.25-10 MG/5ML PO SYRP
5.0000 mL | ORAL_SOLUTION | ORAL | Status: DC | PRN
  Administered 2013-09-06 – 2013-09-11 (×7): 5 mL via ORAL
  Filled 2013-09-06 (×7): qty 5

## 2013-09-06 MED ORDER — MORPHINE SULFATE 2 MG/ML IJ SOLN
1.0000 mg | INTRAMUSCULAR | Status: DC | PRN
  Administered 2013-09-06: 1 mg via INTRAVENOUS
  Administered 2013-09-06: 2 mg via INTRAVENOUS
  Filled 2013-09-06 (×2): qty 1

## 2013-09-06 MED ORDER — SODIUM CHLORIDE 0.9 % IV SOLN
INTRAVENOUS | Status: DC
  Administered 2013-09-06: 75 mL/h via INTRAVENOUS

## 2013-09-06 MED ORDER — FLUTICASONE PROPIONATE 50 MCG/ACT NA SUSP
2.0000 | Freq: Two times a day (BID) | NASAL | Status: DC
  Administered 2013-09-06 – 2013-09-11 (×8): 2 via NASAL
  Filled 2013-09-06: qty 16

## 2013-09-06 MED ORDER — ARFORMOTEROL TARTRATE 15 MCG/2ML IN NEBU
15.0000 ug | INHALATION_SOLUTION | Freq: Two times a day (BID) | RESPIRATORY_TRACT | Status: DC
  Administered 2013-09-06 – 2013-09-11 (×11): 15 ug via RESPIRATORY_TRACT
  Filled 2013-09-06 (×13): qty 2

## 2013-09-06 MED ORDER — ALBUTEROL (5 MG/ML) CONTINUOUS INHALATION SOLN
INHALATION_SOLUTION | RESPIRATORY_TRACT | Status: AC
  Administered 2013-09-06: 15 mg/h via RESPIRATORY_TRACT
  Filled 2013-09-06: qty 20

## 2013-09-06 MED ORDER — MAGNESIUM SULFATE 40 MG/ML IJ SOLN
2.0000 g | Freq: Once | INTRAMUSCULAR | Status: AC
  Administered 2013-09-06: 2 g via INTRAVENOUS
  Filled 2013-09-06: qty 50

## 2013-09-06 MED ORDER — DEXMEDETOMIDINE HCL IN NACL 200 MCG/50ML IV SOLN
0.4000 ug/kg/h | INTRAVENOUS | Status: DC
  Administered 2013-09-06: 0.5 ug/kg/h via INTRAVENOUS
  Administered 2013-09-06: 1 ug/kg/h via INTRAVENOUS
  Administered 2013-09-06: 0.6 ug/kg/h via INTRAVENOUS
  Administered 2013-09-06 – 2013-09-07 (×6): 0.5 ug/kg/h via INTRAVENOUS
  Administered 2013-09-08: 0.4 ug/kg/h via INTRAVENOUS
  Administered 2013-09-08: 0.75 ug/kg/h via INTRAVENOUS
  Administered 2013-09-08: 0.4 ug/kg/h via INTRAVENOUS
  Administered 2013-09-08: 0.75 ug/kg/h via INTRAVENOUS
  Filled 2013-09-06 (×11): qty 50

## 2013-09-06 MED ORDER — IPRATROPIUM BROMIDE 0.02 % IN SOLN
RESPIRATORY_TRACT | Status: AC
  Administered 2013-09-06: 1 mg via RESPIRATORY_TRACT
  Filled 2013-09-06: qty 5

## 2013-09-06 MED ORDER — SODIUM CHLORIDE 0.9 % IV SOLN
80.0000 mg | Freq: Two times a day (BID) | INTRAVENOUS | Status: DC
  Administered 2013-09-06 – 2013-09-09 (×6): 80 mg via INTRAVENOUS
  Filled 2013-09-06 (×8): qty 80

## 2013-09-06 MED ORDER — HEPARIN SODIUM (PORCINE) 5000 UNIT/ML IJ SOLN
5000.0000 [IU] | Freq: Three times a day (TID) | INTRAMUSCULAR | Status: DC
  Administered 2013-09-06 – 2013-09-11 (×16): 5000 [IU] via SUBCUTANEOUS
  Filled 2013-09-06 (×18): qty 1

## 2013-09-06 MED ORDER — PIPERACILLIN-TAZOBACTAM 3.375 G IVPB 30 MIN
3.3750 g | Freq: Once | INTRAVENOUS | Status: AC
  Administered 2013-09-06: 3.375 g via INTRAVENOUS
  Filled 2013-09-06: qty 50

## 2013-09-06 MED ORDER — ALBUTEROL SULFATE (2.5 MG/3ML) 0.083% IN NEBU
2.5000 mg | INHALATION_SOLUTION | RESPIRATORY_TRACT | Status: DC | PRN
  Administered 2013-09-08 – 2013-09-09 (×2): 2.5 mg via RESPIRATORY_TRACT
  Filled 2013-09-06 (×2): qty 3

## 2013-09-06 MED ORDER — BUDESONIDE 0.25 MG/2ML IN SUSP
0.2500 mg | Freq: Two times a day (BID) | RESPIRATORY_TRACT | Status: DC
Start: 2013-09-06 — End: 2013-09-11
  Administered 2013-09-06 – 2013-09-11 (×10): 0.25 mg via RESPIRATORY_TRACT
  Filled 2013-09-06 (×13): qty 2

## 2013-09-06 MED ORDER — EPINEPHRINE 0.3 MG/0.3ML IJ SOAJ
0.3000 mg | Freq: Once | INTRAMUSCULAR | Status: AC
  Administered 2013-09-06: 0.3 mg via INTRAMUSCULAR
  Filled 2013-09-06: qty 0.3

## 2013-09-06 NOTE — ED Notes (Addendum)
Blood cultures obtained before antibiotics given °

## 2013-09-06 NOTE — ED Notes (Signed)
Dr. Criss AlvineGoldston to bedside to assess patient. Reports her ribs and legs hurt. Pt reports breathing is no better. Remains on Bipap, is 100%. Family member at bedside. Communicating via writing notes.

## 2013-09-06 NOTE — ED Notes (Addendum)
Critical care MD at bedside. RT took pt off Bipap per Critical Care MD. Teaching patient breathing and swallowing exercises.

## 2013-09-06 NOTE — H&P (Signed)
Name: Julie Kerr MRN: [id number] DOB: Dec 22, 1975    ADMISSION DATE:  09/06/2013 CONSULTATION DATE:  1/23  REFERRING MD :  Criss Alvine PRIMARY SERVICE: PCCM CHIEF COMPLAINT:  Cough and dyspnea   BRIEF PATIENT DESCRIPTION:  This is a 38 year old former smoker (8 mo ago). W/ reported h/o asthma. Just discharged 1/19 for asthmatic exacerbation and bronchitis. Presents to the ER 1/22 w/ persistent cough and acute distress w/ marked progression of upper airway noises.   Clinical note:  >Has had 3 past intubations. 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   SIGNIFICANT EVENTS / STUDIES:  LE dopplers 1/23>>>  LINES / TUBES:  CULTURES: U strep 1/23>>> Respiratory viral panel 1/23>>> BC x 2 1/23>>>  ANTIBIOTICS: levaquin 1/23>>> tamiflu 1/23>>>  HISTORY OF PRESENT ILLNESS:   This is a 38 year old former smoker (stopped 8 mo ago), presents to the ER on 1/23 in acute distress. Had recently been discharged from [facility] on 1/19 for what was felt to be URI and asthmatic exacerbation. Sent home on pred taper, levaquin and instructions to resume home BDs. She was discharged to home and since her d/c she has had persistent cough, nasal discharge, sore throat, and worsening reflux. The cough symptoms have worsened to the point she coughs with every exhalation. She presented to the ER in acute distress. She was initially treated with NIPPV, SABA, IV steroids, and some fentanyl for pain. PCCM was asked to see for respiratory distress.   PAST MEDICAL HISTORY :  Past Medical History  Diagnosis Date  . Asthma   . Anxiety   . COPD (chronic obstructive pulmonary disease)   . Bronchitis    Past Surgical History  Procedure Laterality Date  . Spinal fusion     Prior to Admission medications   Medication Sig Start Date End Date Taking? Authorizing Provider  albuterol (PROVENTIL HFA;VENTOLIN HFA) 108 (90 BASE) MCG/ACT inhaler Inhale 2 puffs into  the lungs every 4 (four) hours as needed for wheezing. 08/06/13  Yes Maryann Mikhail, DO  albuterol (PROVENTIL) (5 MG/ML) 0.5% nebulizer solution Take 0.5 mLs (2.5 mg total) by nebulization every 6 (six) hours as needed for wheezing or shortness of breath. 08/06/13  Yes Maryann Mikhail, DO  ALPRAZolam (XANAX) 1 MG tablet Take 1 mg by mouth 3 (three) times daily.   Yes Historical Provider, MD  benzonatate (TESSALON) 200 MG capsule Take 1 capsule (200 mg total) by mouth 3 (three) times daily as needed for cough. 08/06/13  Yes Maryann Mikhail, DO  cyclobenzaprine (FLEXERIL) 10 MG tablet Take 10 mg by mouth 3 (three) times daily as needed for muscle spasms.   Yes Historical Provider, MD  feeding supplement, ENSURE COMPLETE, (ENSURE COMPLETE) LIQD Take 237 mLs by mouth 2 (two) times daily between meals. 08/06/13  Yes Maryann Mikhail, DO  Fluticasone-Salmeterol (ADVAIR) 250-50 MCG/DOSE AEPB Inhale 1 puff into the lungs 2 (two) times daily. 06/18/13  Yes Elease Etienne, MD  guaiFENesin (MUCINEX) 600 MG 12 hr tablet Take 1 tablet (600 mg total) by mouth 2 (two) times daily. 06/18/13  Yes Elease Etienne, MD  levofloxacin (LEVAQUIN) 750 MG tablet Take 1 tablet (750 mg total) by mouth daily. 08/06/13  Yes Maryann Mikhail, DO  montelukast (SINGULAIR) 10 MG tablet Take 10 mg by mouth daily.    Yes Historical Provider, MD  morphine (MS CONTIN) 15 MG 12 hr tablet Take 15 mg by mouth  every 12 (twelve) hours.   Yes Historical Provider, MD  oxyCODONE (ROXICODONE) 15 MG immediate release tablet Take 15 mg by mouth every 4 (four) hours as needed for pain.   Yes Historical Provider, MD  Phenylephrine-Pheniramine-DM [facility] COLD & COUGH PO) Take 30 mLs by mouth daily as needed (for cold).   Yes Historical Provider, MD  predniSONE (DELTASONE) 20 MG tablet Take 2 tablets (40 mg total) by mouth daily. 09/03/13  Yes Calvert Cantor, MD  zolpidem (AMBIEN) 10 MG tablet Take 10 mg by mouth at bedtime.   Yes Historical Provider, MD    Allergies  Allergen Reactions  . Robitussin Dm [Dextromethorphan-Guaifenesin] Nausea And Vomiting  . Nsaids Hives  . Tramadol Hives    FAMILY HISTORY:  No family history on file. SOCIAL HISTORY:  reports that she has been smoking.  She has never used smokeless tobacco. She reports that she does not drink alcohol. Her drug history is not on file.  Review of Systems:   Bolds are positive  Constitutional: weight loss, gain, night sweats, Fevers, chills, fatigue .  HEENT: headaches, Sore throat, sneezing, nasal congestion, post nasal drip, Difficulty swallowing, Tooth/dental problems, visual complaints visual changes, ear ache CV:  chest pain, radiates: ,Orthopnea, PND, swelling in lower extremities, dizziness, palpitations, syncope.  GI  heartburn, indigestion, abdominal pain, nausea, vomiting, diarrhea, change in bowel habits, loss of appetite, bloody stools.  Resp: cough, productive: , hemoptysis, dyspnea, chest pain, pleuritic.  Skin: rash or itching or icterus GU: dysuria, change in color of urine, urgency or frequency. flank pain, hematuria  MS: joint pain or swelling. decreased range of motion  Psych: change in mood or affect. depression or anxiety.  Neuro: difficulty with speech, weakness, numbness, ataxia    SUBJECTIVE:  In acute distress.  VITAL SIGNS: Temp:  [98.5 F (36.9 C)] 98.5 F (36.9 C) (01/23 1408) Pulse Rate:  [92-109] 109 (01/23 1515) Resp:  [20-42] 27 (01/23 1515) BP: (111-158)/(65-134) 138/125 mmHg (01/23 1515) SpO2:  [95 %-100 %] 100 % (01/23 1515) FiO2 (%):  [60 %] 60 % (01/23 1427) Humidified face tent  HEMODYNAMICS:   VENTILATOR SETTINGS: Vent Mode:  [-]  FiO2 (%):  [60 %] 60 % INTAKE / OUTPUT: Intake/Output   None     PHYSICAL EXAMINATION: General:  Obese 38 year old aaf, currently w/ significant distress. No better off BIPAP than on.  Neuro:  Anxious, follows commands.  HEENT:  +nasal congestion, posterior pharynx is erythremic and  ulcerated, she has a loud audible upper airway pseudo-wheeze with barking cough  Cardiovascular:  rrr Lungs:  Possible exp wheeze. Difficult to determine with the degree of upper airway involvement  Abdomen:  Non-tender + bowel sounds  Musculoskeletal:  Intact, states her joints hurt  Skin:  Warm and intact   LABS:  CBC  Recent Labs Lab 09/02/13 2358 09/03/13 0235 09/06/13 1418  WBC 13.6* 14.7* 9.8  HGB 10.6* 10.8* 12.5  HCT 32.8* 34.2* 37.3  PLT 401* 410* 422*   Coag's No results found for this basename: APTT, INR,  in the last 168 hours BMET  Recent Labs Lab 09/02/13 1840 09/02/13 2358 09/03/13 0235 09/06/13 1418  NA 141  --  138 140  K 4.5  --  3.3* 4.4  CL 103  --  99 103  CO2 21  --  15* 22  BUN 10  --  12 14  CREATININE 0.78 0.91 0.81 0.68  GLUCOSE 107*  --  328* 121*   Electrolytes  Recent Labs Lab 09/02/13 1840 09/03/13 0235 09/06/13 1418  CALCIUM 10.4 9.5 9.6   Sepsis Markers  Recent Labs Lab 09/02/13 1850  LATICACIDVEN 2.04   ABG  Recent Labs Lab 09/02/13 1925 09/06/13 1508  PHART 7.419 7.455*  PCO2ART 36.5 32.2*  PO2ART 37.0* 369.0*   Liver Enzymes  Recent Labs Lab 09/03/13 0235  AST 9  ALT 8  ALKPHOS 61  BILITOT 0.2*  ALBUMIN 3.8   Cardiac Enzymes  Recent Labs Lab 09/02/13 2358  PROBNP <5.0   Glucose No results found for this basename: GLUCAP,  in the last 168 hours  Imaging Dg Chest Portable 1 View  09/06/2013   CLINICAL DATA:  Shortness of Breath  EXAM: PORTABLE CHEST - 1 VIEW  COMPARISON:  September 02, 2013  FINDINGS: Lungs are clear. The heart size and pulmonary vascularity are normal. No adenopathy. No pneumothorax. No bone lesions.  IMPRESSION: No abnormality noted.   Electronically Signed   By: Bretta Bang M.D.   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 She seems to be responding best to cough suppression. Did not respond well to NIPPV  Refractory Cough. The cough is no doubt contributing to her perception of distress. Think that this is all exacerbated by on going PND, and reflux.  P:   Admit to ICU for precedex infusion. Hope that this will calm her and decrease her cough  Add aggressive nasal saline hygiene regimen  Add aggressive reflux treatment  Humidified face tent w/ PRN BIPAP Brovana/budesonide w/ PRN SABA See ID section    CARDIOVASCULAR A: Chest pain. Think that this is muscular skeletal from cough  P:  Cycle CEs Get EKG Tele -->will be at risk for bradycardia on precedex.  rx pain   RENAL A:  No acute  P:   IVFs Keep even volume status  F/u chemistry in am   GASTROINTESTINAL A:  Poorly controlled reflux: this is worsening her upper airway irritation  P:   Add BID PPI Add HS pepcid q 6 hr reglan  Aspiration/reflux precautions   HEMATOLOGIC A:  No acute P:  [place] heparin  F/u cbc in am   INFECTIOUS A:  Possible acute sinusitis/tracheobronchitis  P:   Change levaquin to zosyn as recently d/c from hospital  Ck PCT  Ck viral panel was neg on 20th for influenza PCR, but will start tamiflu for now   ENDOCRINE A:  Mild hyperglycemia  P:   ssi   NEUROLOGIC A:  Acute anxiety think this a huge contributing factor to her distress.  P:   Start precedex gtt   TODAY'S SUMMARY: her primary issue seems to be refractory cough, difficult to tell if there is any true bronchospasm at all. The primary contributing factors here are a) persistent nasal gtt b) worsening and poorly controlled reflux and c) some degree of anxiety. 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 We will admit her to the ICU, use aggressive nasal hygiene  and reflux regimen. Will treat possible sinusitis and although not convinced this is asthma will place her on some systemic steroids and scheduled BDs. She will require ICU admit.  Anders SimmondsPete Babcock Pulmonary and Critical Care Medicine [facility] Pager: [phone]  09/06/2013, 3:57 PM  STAFF NOTE: I, Dr Lavinia SharpsM Kinjal Neitzke have personally reviewed patient's available data, including medical history, events of note, physical examination and test results as part of my evaluation. I have discussed with resident/NP and other care providers such as pharmacist, RN and RRT.  In addition,  I personally evaluated patient and elicited key findings of VCD +/- AE asthma resulting in acute resp failure. ABG and normal  lactic acid suggest better prognosis. Will Rx for asthma and Rx for VCD with cough suppression, nasal drip control. Of note, will do LABA nebs given her failure to SABA nebs and some anecdotal experience with success on this front. Keep in ICU. Monitor closely  Rest per NP/medical resident whose note is outlined above and that I agree with  The patient is critically ill with multiple organ systems failure and requires high complexity decision making for assessment and support, frequent evaluation and titration of therapies, application of advanced monitoring technologies and extensive interpretation of multiple databases.   Critical Care Time devoted to patient care services described in this note is  35  Minutes indepdent of NP time  Dr. Kalman ShanMurali Rosaleah Person, M.D., [facility] Institute[facility]C.C.P Pulmonary and Critical Care Medicine Staff Physician [facility] Pager: [phone], If no answer or between  15:00h - 7:00h: call [phone]  09/06/2013 5:13 PM

## 2013-09-06 NOTE — Progress Notes (Signed)
Pt arrived to 2M06 in no obvious respiratory distress, and on RA. BIPAP in room if needed. RT will continue to monitor.

## 2013-09-06 NOTE — Progress Notes (Signed)
ANTIBIOTIC CONSULT NOTE - INITIAL  Pharmacy Consult for Zosyn Indication: Acute sinusitis/tracheobronchitis   Allergies  Allergen Reactions  . Robitussin Dm [Dextromethorphan-Guaifenesin] Nausea And Vomiting  . Nsaids Hives  . Tramadol Hives    Patient Measurements: Height: 5' 1.81" (157 cm) Weight: 178 lb 9.2 oz (81 kg) IBW/kg (Calculated) : 49.67 Adjusted Body Weight: n/a  Vital Signs: Temp: 98.5 F (36.9 C) (01/23 1408) Temp src: Oral (01/23 1408) BP: 138/125 mmHg (01/23 1515) Pulse Rate: 109 (01/23 1515) Intake/Output from previous day:   Intake/Output from this shift:    Labs:  Recent Labs  09/06/13 1418  WBC 9.8  HGB 12.5  PLT 422*  CREATININE 0.68   Estimated Creatinine Clearance: 96.4 ml/min (by C-G formula based on Cr of 0.68). No results found for this basename: VANCOTROUGH, Leodis BinetVANCOPEAK, VANCORANDOM, GENTTROUGH, GENTPEAK, GENTRANDOM, TOBRATROUGH, TOBRAPEAK, TOBRARND, AMIKACINPEAK, AMIKACINTROU, AMIKACIN,  in the last 72 hours   Microbiology: Recent Results (from the past 720 hour(s))  MRSA PCR SCREENING     Status: None   Collection Time    09/03/13 12:05 AM      Result Value Range Status   MRSA by PCR NEGATIVE  NEGATIVE Final   Comment:            The GeneXpert MRSA Assay (FDA     approved for NASAL specimens     only), is one component of a     comprehensive MRSA colonization     surveillance program. It is not     intended to diagnose MRSA     infection nor to guide or     monitor treatment for     MRSA infections.    Medical History: Past Medical History  Diagnosis Date  . Asthma   . Anxiety   . COPD (chronic obstructive pulmonary disease)   . Bronchitis     Medications:   (Not in a hospital admission) Assessment: [age] YOF (former smoker) presents to the ED on 1/23 persistent cough, nasal discharge, sore throat, and worsening reflux. [facility] URI and asthmatic exacerbation on prednisone taper  and PO levaquin. To expand coverage with IV zosyn. She is afebrile with normal WBC. CrCl~ 96 mL/min.   Cultures 1/23 Blood Cx >> 1/23 RSV >> Goal of Therapy:  Eradication of infection  Plan:  1) Start Zosyn 3.375 gm IV Q 8 hours  2) F/u CBC, renal fx, cultures, and patient clinical status   Vinnie LevelBenjamin Alexandra Lipps, PharmD.  Clinical Pharmacist Pager [phone]

## 2013-09-06 NOTE — ED Notes (Signed)
Pt still tolerating Bipap, will continue to monitor.

## 2013-09-06 NOTE — ED Notes (Signed)
Pt on Bipap tolerating well. Pt is alert and resting in bed.

## 2013-09-06 NOTE — ED Notes (Signed)
Phlebotomy at bedside.

## 2013-09-06 NOTE — ED Provider Notes (Signed)
CSN: [id number]     Arrival date & time 09/06/13  1403 History   First MD Initiated Contact with Patient 09/06/13 1413     Chief Complaint  Patient presents with  . Shortness of Breath   (Consider location/radiation/quality/duration/timing/severity/associated sxs/prior Treatment) HPI Comments: 38 yo female with asthma presents in respiratory distress. The history is limited by patient unable to say but a few words at a time due to this. She apparently was recently discharged and she thinks she went back to school "too soon". She states her difficulty breathing is associated with a fever of 102 at home PTA. She has been intubated 3 times in the past, most recently 6 months ago. She states this is similar to her multiple prior asthma exacerbations.   Past Medical History  Diagnosis Date  . Asthma   . Anxiety   . COPD (chronic obstructive pulmonary disease)   . Bronchitis    Past Surgical History  Procedure Laterality Date  . Spinal fusion     No family history on file. History  Substance Use Topics  . Smoking status: Current Every Day Smoker  . Smokeless tobacco: Never Used  . Alcohol Use: No   OB History   Grav Para Term Preterm Abortions TAB SAB Ect Mult Living                 Review of Systems  Unable to perform ROS: Severe respiratory distress    Allergies  Robitussin dm; Nsaids; and Tramadol  Home Medications   Current Outpatient Rx  Name  Route  Sig  Dispense  Refill  . albuterol (PROVENTIL HFA;VENTOLIN HFA) 108 (90 BASE) MCG/ACT inhaler   Inhalation   Inhale 2 puffs into the lungs every 4 (four) hours as needed for wheezing.   3.7 g   1   . albuterol (PROVENTIL) (5 MG/ML) 0.5% nebulizer solution   Nebulization   Take 0.5 mLs (2.5 mg total) by nebulization every 6 (six) hours as needed for wheezing or shortness of breath.   20 mL   1   . ALPRAZolam (XANAX) 1 MG tablet   Oral   Take 1 mg by mouth 3 (three) times daily.         . benzonatate (TESSALON)  200 MG capsule   Oral   Take 1 capsule (200 mg total) by mouth 3 (three) times daily as needed for cough.   20 capsule   0   . cyclobenzaprine (FLEXERIL) 10 MG tablet   Oral   Take 10 mg by mouth 3 (three) times daily as needed for muscle spasms.         . feeding supplement, ENSURE COMPLETE, (ENSURE COMPLETE) LIQD   Oral   Take 237 mLs by mouth 2 (two) times daily between meals.         . Fluticasone-Salmeterol (ADVAIR) 250-50 MCG/DOSE AEPB   Inhalation   Inhale 1 puff into the lungs 2 (two) times daily.         Marland Kitchen. guaiFENesin (MUCINEX) 600 MG 12 hr tablet   Oral   Take 1 tablet (600 mg total) by mouth 2 (two) times daily.   30 tablet   0   . levofloxacin (LEVAQUIN) 750 MG tablet   Oral   Take 1 tablet (750 mg total) by mouth daily.   3 tablet   0   . montelukast (SINGULAIR) 10 MG tablet   Oral   Take 10 mg by mouth daily.          .Marland Kitchen  morphine (MS CONTIN) 15 MG 12 hr tablet   Oral   Take 15 mg by mouth every 12 (twelve) hours.         Marland Kitchen oxyCODONE (ROXICODONE) 15 MG immediate release tablet   Oral   Take 15 mg by mouth every 4 (four) hours as needed for pain.         Marland Kitchen Phenylephrine-Pheniramine-DM (THERAFLU COLD & COUGH PO)   Oral   Take 30 mLs by mouth daily as needed (for cold).         . predniSONE (DELTASONE) 20 MG tablet   Oral   Take 2 tablets (40 mg total) by mouth daily.   8 tablet   0     Start at 6 PM tonight   . zolpidem (AMBIEN) 10 MG tablet   Oral   Take 10 mg by mouth at bedtime.          BP 142/112  Pulse 94  Temp(Src) 98.5 F (36.9 C) (Oral)  Resp 20  SpO2 95% Physical Exam  Nursing note and vitals reviewed. Constitutional: She is oriented to person, place, and time. She appears well-developed and well-nourished. She appears ill. She appears distressed.  HENT:  Head: Normocephalic and atraumatic.  Right Ear: External ear normal.  Left Ear: External ear normal.  Nose: Nose normal.  Eyes: Right eye exhibits no  discharge. Left eye exhibits no discharge.  Cardiovascular: Normal rate, regular rhythm and normal heart sounds.   Pulmonary/Chest: Accessory muscle usage present. Tachypnea noted. She is in respiratory distress. She has wheezes.  Loud breathing by the patient makes the lung exam difficult. Diffuse inspiratory and expiratory wheezing  Abdominal: Soft. There is no tenderness.  Musculoskeletal: She exhibits no edema.  Neurological: She is alert and oriented to person, place, and time.  Skin: Skin is warm and dry.    ED Course  Procedures (including critical care time) Labs Review Labs Reviewed  CBC WITH DIFFERENTIAL - Abnormal; Notable for the following:    MCV 76.0 (*)    MCH 25.5 (*)    RDW 17.7 (*)    Platelets 422 (*)    All other components within normal limits  BASIC METABOLIC PANEL - Abnormal; Notable for the following:    Glucose, Bld 121 (*)    All other components within normal limits  POCT I-STAT 3, BLOOD GAS (G3+) - Abnormal; Notable for the following:    pH, Arterial 7.455 (*)    pCO2 arterial 32.2 (*)    pO2, Arterial 369.0 (*)    All other components within normal limits   Imaging Review Dg Chest Portable 1 View  09/06/2013   CLINICAL DATA:  Shortness of Breath  EXAM: PORTABLE CHEST - 1 VIEW  COMPARISON:  September 02, 2013  FINDINGS: Lungs are clear. The heart size and pulmonary vascularity are normal. No adenopathy. No pneumothorax. No bone lesions.  IMPRESSION: No abnormality noted.   Electronically Signed   By: Bretta Bang M.D.   On: 09/06/2013 15:07    EKG Interpretation    Date/Time:  Friday September 06 2013 14:35:18 EST Ventricular Rate:  96 PR Interval:  154 QRS Duration: 77 QT Interval:  355 QTC Calculation: 449 R Axis:   68 Text Interpretation:  Normal sinus rhythm Artifact No significant change since Confirmed by Saurabh Hettich  MD, Celie Desrochers ([id number]) on 09/06/2013 3:36:07 PM           CRITICAL CARE Performed by: Pricilla Loveless T   Total critical  care  time: 40 minutes  Critical care time was exclusive of separately billable procedures and treating other patients.  Critical care was necessary to treat or prevent imminent or life-threatening deterioration.  Critical care was time spent personally by me on the following activities: development of treatment plan with patient and/or surrogate as well as nursing, discussions with consultants, evaluation of patient's response to treatment, examination of patient, obtaining history from patient or surrogate, ordering and performing treatments and interventions, ordering and review of laboratory studies, ordering and review of radiographic studies, pulse oximetry and re-evaluation of patient's condition.  MDM   1. Asthma exacerbation   2. Respiratory failure    Patient with recurrent dyspnea and wheezing. Just discharged for the same, finished abx as well. Febrile at home but none here. No PNA on CXR. Immediately started on continuous albuterol/atrovent, bipap and magnesium. Given small doses of fentanly as she states her ribs were hurting from how hard she is breathing. Given this is recurrent I do not feel she needs a PE w/u at this time. Low concern for ACS. She stated this is not as bad as times she's been intubated, and does not feel more tired while in ED. She did not seem to be improving much, so she was also given Epi IM. Critical care consulted to help manage patient in ICU. Do not feel she needs intubation at this time.    Audree Camel, MD 09/07/13 1131

## 2013-09-06 NOTE — Progress Notes (Signed)
Unit CM UR Completed by [facility] ED CM  W. Pelham Hennick RN  

## 2013-09-06 NOTE — ED Notes (Signed)
Pt reports that she was here 2 days ago and went home. Reports that she has had increased SOB and cough. Pt with audible wheezing.

## 2013-09-06 NOTE — ED Notes (Signed)
Pt writing noted with pen communicating with RN. Offered to call family member for patient, reports does not want at this time.

## 2013-09-07 ENCOUNTER — Encounter (HOSPITAL_COMMUNITY): Payer: Self-pay | Admitting: *Deleted

## 2013-09-07 DIAGNOSIS — R0989 Other specified symptoms and signs involving the circulatory and respiratory systems: Secondary | ICD-10-CM

## 2013-09-07 DIAGNOSIS — R0609 Other forms of dyspnea: Secondary | ICD-10-CM

## 2013-09-07 DIAGNOSIS — G894 Chronic pain syndrome: Secondary | ICD-10-CM

## 2013-09-07 LAB — BASIC METABOLIC PANEL
BUN: 10 mg/dL (ref 6–23)
CALCIUM: 9.1 mg/dL (ref 8.4–10.5)
CO2: 19 mEq/L (ref 19–32)
Chloride: 104 mEq/L (ref 96–112)
Creatinine, Ser: 0.63 mg/dL (ref 0.50–1.10)
GFR calc Af Amer: >90 mL/min (ref >90)
GFR calc non Af Amer: >90 mL/min (ref >90)
GLUCOSE: 227 mg/dL — AB (ref 70–99)
Potassium: 4.8 mEq/L (ref 3.7–5.3)
Sodium: 138 mEq/L (ref 137–147)

## 2013-09-07 LAB — CBC
HCT: 31.3 % — ABNORMAL LOW (ref 36.0–46.0)
Hemoglobin: 10.2 g/dL — ABNORMAL LOW (ref 12.0–15.0)
MCH: 24.5 pg — AB (ref 26.0–34.0)
MCHC: 32.6 g/dL (ref 30.0–36.0)
MCV: 75.2 fL — AB (ref 78.0–100.0)
PLATELETS: 328 10*3/uL (ref 150–400)
RBC: 4.16 MIL/uL (ref 3.87–5.11)
RDW: 17.6 % — ABNORMAL HIGH (ref 11.5–15.5)
WBC: 15.4 10*3/uL — ABNORMAL HIGH (ref 4.0–10.5)

## 2013-09-07 LAB — GLUCOSE, CAPILLARY
GLUCOSE-CAPILLARY: 183 mg/dL — AB (ref 70–99)
Glucose-Capillary: 182 mg/dL — ABNORMAL HIGH (ref 70–99)
Glucose-Capillary: 161 mg/dL — ABNORMAL HIGH (ref 70–99)
Glucose-Capillary: 291 mg/dL — ABNORMAL HIGH (ref 70–99)
Glucose-Capillary: 260 mg/dL — ABNORMAL HIGH (ref 70–99)

## 2013-09-07 LAB — TROPONIN I

## 2013-09-07 LAB — STREP PNEUMONIAE URINARY ANTIGEN: STREP PNEUMO URINARY ANTIGEN: NEGATIVE

## 2013-09-07 MED ORDER — ENSURE COMPLETE PO LIQD
237.0000 mL | Freq: Two times a day (BID) | ORAL | Status: DC
  Administered 2013-09-07: 15:00:00 via ORAL
  Administered 2013-09-07 – 2013-09-11 (×8): 237 mL via ORAL
  Filled 2013-09-07 (×2): qty 237

## 2013-09-07 MED ORDER — PIPERACILLIN-TAZOBACTAM 3.375 G IVPB
3.3750 g | Freq: Three times a day (TID) | INTRAVENOUS | Status: DC
  Administered 2013-09-07 – 2013-09-08 (×4): 3.375 g via INTRAVENOUS
  Filled 2013-09-07 (×6): qty 50

## 2013-09-07 NOTE — H&P (Signed)
Name: Julie Kerr MRN: [id number] DOB: 1976/01/05    ADMISSION DATE:  09/06/2013 CONSULTATION DATE:  1/23  REFERRING MD :  Criss Alvine PRIMARY SERVICE: PCCM CHIEF COMPLAINT:  Cough and dyspnea    BRIEF PATIENT DESCRIPTION:  This is a 38 year old former smoker (8 mo ago). W/ reported h/o asthma. Just discharged 1/19 for asthmatic exacerbation and bronchitis. Presents to the ER 1/22 w/ persistent cough and acute distress w/ marked progression of upper airway noises.   Clinical note:  >Has had 3 past intubations. 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   SIGNIFICANT EVENTS / STUDIES:  LE dopplers 1/23>>>  LINES / TUBES:  CULTURES: U strep 1/23>>> Respiratory viral panel 1/23>>> BC x 2 1/23>>>  ANTIBIOTICS: levaquin 1/23>>> tamiflu 1/23>>>  HISTORY OF PRESENT ILLNESS:   This is a 38 year old former smoker (stopped 8 mo ago), presents to the ER on 1/23 in acute distress. Had recently been discharged from [facility] on 1/19 for what was felt to be URI and asthmatic exacerbation. Sent home on pred taper, levaquin and instructions to resume home BDs. She was discharged to home and since her d/c she has had persistent cough, nasal discharge, sore throat, and worsening reflux. The cough symptoms have worsened to the point she coughs with every exhalation. She presented to the ER in acute distress. She was initially treated with NIPPV, SABA, IV steroids, and some fentanyl for pain. PCCM was asked to see for respiratory distress.   SUBJECTIVE:  Sitting up in bed on precedex , calm Requesting real food  Decreased cough and wheezing  Requests pain meds for diffuse pain   VITAL SIGNS: Temp:  [98.1 F (36.7 C)-98.9 F (37.2 C)] 98.1 F (36.7 C) (01/24 0819) Pulse Rate:  [69-113] 83 (01/24 0705) Resp:  [15-42] 25 (01/24 0705) BP: (77-158)/(33-134) 108/69 mmHg (01/24 0705) SpO2:  [95 %-100 %] 99 % (01/24 0705) FiO2 (%):  [60 %] 60 %  (01/23 1427) Weight:  [81 kg (178 lb 9.2 oz)-83.6 kg (184 lb 4.9 oz)] 83.6 kg (184 lb 4.9 oz) (01/24 0400) Humidified face tent  HEMODYNAMICS:   VENTILATOR SETTINGS: Vent Mode:  [-]  FiO2 (%):  [60 %] 60 % INTAKE / OUTPUT: Intake/Output     01/23 0701 - 01/24 0700 01/24 0701 - 01/25 0700   P.O. 2100    I.V. (mL/kg) 825.5 (9.9)    IV Piggyback 250    Total Intake(mL/kg) 3175.5 (38)    Urine (mL/kg/hr) 4050    Total Output 4050     Net -874.5            PHYSICAL EXAMINATION: General:  Obese 38 year old aaf, :NAD  Neuro:  Calm , f/c  HEENT:  +nasal congestion, posterior pharynx is erythremic and ulcerated,  Harsh cough, faint upper airway psuedowheeze , no stridor, talking in full sentences  Cardiovascular:  rrr Lungs:  Moving air, faint exp wheeze vs referred psuedowheeze  Abdomen:  Non-tender + bowel sounds  Musculoskeletal:  Intact, MAEW , no deformity  Skin:  Warm and intact   LABS:  CBC  Recent Labs Lab 09/06/13 1418 09/06/13 1704 09/07/13 0305  WBC 9.8 16.5* 15.4*  HGB 12.5 10.6* 10.2*  HCT 37.3 32.0* 31.3*  PLT 422* 351 328   Coag's No results found for this basename: APTT, INR,  in the last 168 hours BMET  Recent Labs Lab 09/03/13 0235 09/06/13  1418 09/06/13 1704 09/07/13 0305  NA 138 140  --  138  K 3.3* 4.4  --  4.8  CL 99 103  --  104  CO2 15* 22  --  19  BUN 12 14  --  10  CREATININE 0.81 0.68 0.68 0.63  GLUCOSE 328* 121*  --  227*   Electrolytes  Recent Labs Lab 09/03/13 0235 09/06/13 1418 09/07/13 0305  CALCIUM 9.5 9.6 9.1   Sepsis Markers  Recent Labs Lab 09/02/13 1850  LATICACIDVEN 2.04   ABG  Recent Labs Lab 09/02/13 1925 09/06/13 1508  PHART 7.419 7.455*  PCO2ART 36.5 32.2*  PO2ART 37.0* 369.0*   Liver Enzymes  Recent Labs Lab 09/03/13 0235  AST 9  ALT 8  ALKPHOS 61  BILITOT 0.2*  ALBUMIN 3.8   Cardiac Enzymes  Recent Labs Lab 09/02/13 2358 09/06/13 1703 09/06/13 2241 09/07/13 0305  TROPONINI   --  <0.30 <0.30 <0.30  PROBNP <5.0  --   --   --    Glucose  Recent Labs Lab 09/06/13 1857 09/06/13 2336 09/07/13 0404 09/07/13 0713  GLUCAP 185* 278* 182* 161*    Imaging Dg Chest Portable 1 View  09/06/2013   CLINICAL DATA:  Shortness of Breath  EXAM: PORTABLE CHEST - 1 VIEW  COMPARISON:  September 02, 2013  FINDINGS: Lungs are clear. The heart size and pulmonary vascularity are normal. No adenopathy. No pneumothorax. No bone lesions.  IMPRESSION: No abnormality noted.   Electronically Signed   By: Bretta Bang M.D.   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 Refractory to NIPPV  Refractory Cough. The cough is no doubt contributing to her perception of distress. Think that this is all exacerbated by on going PND, and reflux.  1/24>improved on precedex w/ decreased anxiety   P:   Admitted  to ICU for precedex infusion 1/23, will try to wean over next 48h Cont aggressive nasal saline hygiene regimen  Cont aggressive reflux treatment  BIPAP As needed   Brovana/budesonide w/ PRN SABA See ID section  Cont IV steroids- wean as possible as may be contributing to anxiety issues/mood lability    CARDIOVASCULAR A: Chest pain. Think that this is muscular skeletal from cough  Cardiac enzyme neg  P:  Tele -->will be at risk for bradycardia on precedex.  rx pain   RENAL A:  No acute  P:   IVFs Keep even volume status  F/u chemistry in am   GASTROINTESTINAL A:  Poorly controlled reflux: this is worsening her upper airway irritation  P:   PPI BID  HS pepcid q 6 hr reglan  Aspiration/reflux precautions   HEMATOLOGIC A:  Anemia  P:  [place] heparin  F/u cbc in am   INFECTIOUS A:  Possible acute sinusitis/tracheobronchitis  P:   Cont zosyn as recently d/c from hospital >> plan to stop  1/25 viral panel pending-cont  tamiflu for now  ENDOCRINE A:   hyperglycemia  P:   ssi   NEUROLOGIC A:  Acute anxiety think this a huge contributing factor to her distress.  Chronic pain syndrome  P:   Continue precedex gtt -as improves will need to transition  Prn pain meds    TODAY'S SUMMARY: her primary issue seems to be refractory cough, chronic pain and anxiety .  difficult to tell if there is any true bronchospasm at all. The primary contributing factors here are a) persistent nasal gtt b) worsening and poorly controlled reflux and c) some degree of anxiety/chronic pain . Marland Kitchen. 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  High risk as multiple hospital admission with 3 previous intubation.  Will treat possible sinusitis and although not convinced this is asthma will cont systemic steroids and scheduled BDs.      09/07/2013, 8:38 AM Tammy Parrett NP-C  [facility]  [phone]   Levy Pupaobert Jerusalem Wert, MD, PhD 09/07/2013, 12:03 PM [facility] [phone] or if no answer [phone]

## 2013-09-07 NOTE — Discharge Summary (Addendum)
Physician Discharge Summary  Julie Schildermanda Umble ZOX:[id number] DOB: 01/06/1976 DOA: 09/02/2013  PCP: Lu DuffelWELLS,WENDELL, MD  Admit date: 09/02/2013 Discharge date: 09/03/2013  Time spent: >45 minutes   Discharge Diagnoses:  Principal Problem:   Acute respiratory failure/  Asthma exacerbation Active Problems:   Tobacco use disorder   Chronic pain syndrome    Anxiety  Discharge Condition: stable  Diet recommendation: heart healty  Filed Weights   09/02/13 2300 09/03/13 0400  Weight: 178 lb 9.2 oz (81 kg) 178 lb 9.2 oz (81 kg)    History of present illness:  Julie Kerr is a 38 y.o. female with history of bronchial asthma and tobacco abuse and chronic pain presents to the ER because of worsening shortness of breath for last 24 hours. Patient has been recently in the hospital twice in the last one month with asthma exacerbation. Patient states that her shortness of breath started last night with productive cough and subjective feeling of fever and chills. Since the symptoms got worse she came to the ER. In the ER patient was found to be hypoxic with bilateral expiratory wheeze. Patient was placed on BiPAP and was started on nebulizer and steroids. Chest x-ray does not show any acute infiltrates. Patient has recurrent severe cough while examining. Patient otherwise denies any chest pain palpitations nausea vomiting abdominal pain diarrhea. Patient's ABG does not show any carbon dioxide retention. In the ER patient has been afebrile but patient states she was having a fever of 103F at her house. Patient will be admitted for further management.    Hospital Course:   Acute respiratory failure - likely asthma although vocal cord dysfunction is also a possibility - cont Levaquin and Prednisone on d/c - f/u with pulmonary in 1-2 wks  Chronic pain - c/o pain in hips and knees- no adjustments made to pain meds upon d/x - cont to f/u with pain management  Tobacco abuse - in the past- recently  quit  Sinus tachycardia - HTN? - BP low normal- HR stable even upon exertion - Verapamil held  Anxiety - cont Xanax   Discharge Exam: Filed Vitals:   09/03/13 1730  BP: 117/51  Pulse: 90  Temp: 98.1 F (36.7 C)  Resp: 17    General: AAo x 3, no distress Cardiovascular: RRR, no murmurs Respiratory: CTA b/l  Discharge Instructions  Discharge Orders   Future Orders Complete By Expires   Diet - low sodium heart healthy  As directed    Increase activity slowly  As directed        Medication List    STOP taking these medications       ibuprofen 200 MG tablet  Commonly known as:  ADVIL,MOTRIN     verapamil 120 MG tablet  Commonly known as:  CALAN      TAKE these medications       albuterol 108 (90 BASE) MCG/ACT inhaler  Commonly known as:  PROVENTIL HFA;VENTOLIN HFA  Inhale 2 puffs into the lungs every 4 (four) hours as needed for wheezing.     albuterol (5 MG/ML) 0.5% nebulizer solution  Commonly known as:  PROVENTIL  Take 0.5 mLs (2.5 mg total) by nebulization every 6 (six) hours as needed for wheezing or shortness of breath.     ALPRAZolam 1 MG tablet  Commonly known as:  XANAX  Take 1 mg by mouth 3 (three) times daily.     benzonatate 200 MG capsule  Commonly known as:  TESSALON  Take 1 capsule (200 mg  total) by mouth 3 (three) times daily as needed for cough.     cyclobenzaprine 10 MG tablet  Commonly known as:  FLEXERIL  Take 10 mg by mouth 3 (three) times daily as needed for muscle spasms.     feeding supplement (ENSURE COMPLETE) Liqd  Take 237 mLs by mouth 2 (two) times daily between meals.     Fluticasone-Salmeterol 250-50 MCG/DOSE Aepb  Commonly known as:  ADVAIR  Inhale 1 puff into the lungs 2 (two) times daily.     guaiFENesin 600 MG 12 hr tablet  Commonly known as:  MUCINEX  Take 1 tablet (600 mg total) by mouth 2 (two) times daily.     levofloxacin 750 MG tablet  Commonly known as:  LEVAQUIN  Take 1 tablet (750 mg total) by mouth  daily.     morphine 15 MG 12 hr tablet  Commonly known as:  MS CONTIN  Take 15 mg by mouth every 12 (twelve) hours.     oxyCODONE 15 MG immediate release tablet  Commonly known as:  ROXICODONE  Take 15 mg by mouth every 4 (four) hours as needed for pain.     predniSONE 20 MG tablet  Commonly known as:  DELTASONE  Take 2 tablets (40 mg total) by mouth daily.     SINGULAIR 10 MG tablet  Generic drug:  montelukast  Take 10 mg by mouth daily.     THERAFLU COLD & COUGH PO  Take 30 mLs by mouth daily as needed (for cold).     zolpidem 10 MG tablet  Commonly known as:  AMBIEN  Take 10 mg by mouth at bedtime.       Allergies  Allergen Reactions  . Robitussin Dm [Dextromethorphan-Guaifenesin] Nausea And Vomiting  . Nsaids Hives  . Tramadol Hives      The results of significant diagnostics from this hospitalization (including imaging, microbiology, ancillary and laboratory) are listed below for reference.    Significant Diagnostic Studies: Dg Chest 2 View  08/10/2013   CLINICAL DATA:  Wheezing.  EXAM: CHEST  2 VIEW  COMPARISON:  None.  FINDINGS: The heart size and mediastinal contours are within normal limits. Both lungs are clear. The visualized skeletal structures are unremarkable.  IMPRESSION: No active cardiopulmonary disease.   Electronically Signed   By: Signa Kell M.D.   On: 08/10/2013 00:19   Dg Chest Portable 1 View  09/06/2013   CLINICAL DATA:  Shortness of Breath  EXAM: PORTABLE CHEST - 1 VIEW  COMPARISON:  September 02, 2013  FINDINGS: Lungs are clear. The heart size and pulmonary vascularity are normal. No adenopathy. No pneumothorax. No bone lesions.  IMPRESSION: No abnormality noted.   Electronically Signed   By: Bretta Bang M.D.   On: 09/06/2013 15:07   Dg Chest Port 1 View  09/02/2013   CLINICAL DATA:  Shortness of breath.  EXAM: PORTABLE CHEST - 1 VIEW  COMPARISON:  DG CHEST 2 VIEW dated 08/09/2013  FINDINGS: The heart size and mediastinal contours are  within normal limits. Both lungs are clear. The visualized skeletal structures are unremarkable.  IMPRESSION: No active disease.   Electronically Signed   By: Charlett Nose M.D.   On: 09/02/2013 19:05    Microbiology: Recent Results (from the past 240 hour(s))  MRSA PCR SCREENING     Status: None   Collection Time    09/03/13 12:05 AM      Result Value Range Status   MRSA by PCR NEGATIVE  NEGATIVE Final   Comment:            The GeneXpert MRSA Assay (FDA     approved for NASAL specimens     only), is one component of a     comprehensive MRSA colonization     surveillance program. It is not     intended to diagnose MRSA     infection nor to guide or     monitor treatment for     MRSA infections.  MRSA PCR SCREENING     Status: None   Collection Time    09/06/13  6:38 PM      Result Value Range Status   MRSA by PCR NEGATIVE  NEGATIVE Final   Comment:            The GeneXpert MRSA Assay (FDA     approved for NASAL specimens     only), is one component of a     comprehensive MRSA colonization     surveillance program. It is not     intended to diagnose MRSA     infection nor to guide or     monitor treatment for     MRSA infections.     Labs: Basic Metabolic Panel:  Recent Labs Lab 09/02/13 1840 09/02/13 2358 09/03/13 0235 09/06/13 1418 09/06/13 1704 09/07/13 0305  NA 141  --  138 140  --  138  K 4.5  --  3.3* 4.4  --  4.8  CL 103  --  99 103  --  104  CO2 21  --  15* 22  --  19  GLUCOSE 107*  --  328* 121*  --  227*  BUN 10  --  12 14  --  10  CREATININE 0.78 0.91 0.81 0.68 0.68 0.63  CALCIUM 10.4  --  9.5 9.6  --  9.1   Liver Function Tests:  Recent Labs Lab 09/03/13 0235  AST 9  ALT 8  ALKPHOS 61  BILITOT 0.2*  PROT 7.8  ALBUMIN 3.8   No results found for this basename: LIPASE, AMYLASE,  in the last 168 hours No results found for this basename: AMMONIA,  in the last 168 hours CBC:  Recent Labs Lab 09/02/13 1840 09/02/13 2358 09/03/13 0235  09/06/13 1418 09/06/13 1704 09/07/13 0305  WBC 11.8* 13.6* 14.7* 9.8 16.5* 15.4*  NEUTROABS 8.1*  --  14.2* 5.9  --   --   HGB 12.4 10.6* 10.8* 12.5 10.6* 10.2*  HCT 37.5 32.8* 34.2* 37.3 32.0* 31.3*  MCV 76.5* 76.5* 78.1 76.0* 75.5* 75.2*  PLT 507* 401* 410* 422* 351 328   Cardiac Enzymes:  Recent Labs Lab 09/06/13 1703 09/06/13 2241 09/07/13 0305  TROPONINI <0.30 <0.30 <0.30   BNP: BNP (last 3 results)  Recent Labs  06/14/13 1610 09/02/13 2358  PROBNP 26.1 <5.0   CBG:  Recent Labs Lab 09/06/13 1857 09/06/13 2336 09/07/13 0404 09/07/13 0713  GLUCAP 185* 278* 182* 161*       Signed:  Ashayla Subia  [facility] 09/03/2013, 10:23 AM

## 2013-09-08 DIAGNOSIS — J441 Chronic obstructive pulmonary disease with (acute) exacerbation: Secondary | ICD-10-CM

## 2013-09-08 DIAGNOSIS — M79609 Pain in unspecified limb: Secondary | ICD-10-CM

## 2013-09-08 DIAGNOSIS — R609 Edema, unspecified: Secondary | ICD-10-CM

## 2013-09-08 LAB — HEMOGLOBIN A1C
Hgb A1c MFr Bld: 5.8 % — ABNORMAL HIGH (ref <5.7)
MEAN PLASMA GLUCOSE: 120 mg/dL — AB (ref <117)

## 2013-09-08 LAB — RESPIRATORY VIRUS PANEL
ADENOVIRUS: NOT DETECTED
INFLUENZA A H1: NOT DETECTED
Influenza A H3: NOT DETECTED
Influenza A: NOT DETECTED
Influenza B: NOT DETECTED
METAPNEUMOVIRUS: NOT DETECTED
PARAINFLUENZA 3 A: NOT DETECTED
Parainfluenza 1: NOT DETECTED
Parainfluenza 2: NOT DETECTED
Respiratory Syncytial Virus A: NOT DETECTED
Respiratory Syncytial Virus B: NOT DETECTED
Rhinovirus: DETECTED — AB

## 2013-09-08 LAB — GLUCOSE, CAPILLARY
GLUCOSE-CAPILLARY: 159 mg/dL — AB (ref 70–99)
GLUCOSE-CAPILLARY: 140 mg/dL — AB (ref 70–99)
Glucose-Capillary: 104 mg/dL — ABNORMAL HIGH (ref 70–99)
Glucose-Capillary: 158 mg/dL — ABNORMAL HIGH (ref 70–99)
Glucose-Capillary: 189 mg/dL — ABNORMAL HIGH (ref 70–99)
Glucose-Capillary: 200 mg/dL — ABNORMAL HIGH (ref 70–99)

## 2013-09-08 LAB — BASIC METABOLIC PANEL
BUN: 9 mg/dL (ref 6–23)
CALCIUM: 9.5 mg/dL (ref 8.4–10.5)
CO2: 22 mEq/L (ref 19–32)
CREATININE: 0.64 mg/dL (ref 0.50–1.10)
Chloride: 102 mEq/L (ref 96–112)
GFR calc Af Amer: >90 mL/min (ref >90)
GLUCOSE: 137 mg/dL — AB (ref 70–99)
Potassium: 4.6 mEq/L (ref 3.7–5.3)
SODIUM: 138 meq/L (ref 137–147)

## 2013-09-08 LAB — CBC
HEMATOCRIT: 32.6 % — AB (ref 36.0–46.0)
Hemoglobin: 10.7 g/dL — ABNORMAL LOW (ref 12.0–15.0)
MCH: 25.1 pg — ABNORMAL LOW (ref 26.0–34.0)
MCHC: 32.8 g/dL (ref 30.0–36.0)
MCV: 76.3 fL — ABNORMAL LOW (ref 78.0–100.0)
PLATELETS: 336 10*3/uL (ref 150–400)
RBC: 4.27 MIL/uL (ref 3.87–5.11)
RDW: 17.8 % — ABNORMAL HIGH (ref 11.5–15.5)
WBC: 23.2 10*3/uL — AB (ref 4.0–10.5)

## 2013-09-08 MED ORDER — GABAPENTIN 300 MG PO CAPS
300.0000 mg | ORAL_CAPSULE | Freq: Three times a day (TID) | ORAL | Status: DC
  Administered 2013-09-08 – 2013-09-09 (×6): 300 mg via ORAL
  Filled 2013-09-08 (×9): qty 1

## 2013-09-08 MED ORDER — METHYLPREDNISOLONE SODIUM SUCC 125 MG IJ SOLR
60.0000 mg | Freq: Two times a day (BID) | INTRAMUSCULAR | Status: AC
  Administered 2013-09-08: 60 mg via INTRAVENOUS
  Filled 2013-09-08: qty 0.96

## 2013-09-08 MED ORDER — MORPHINE SULFATE ER 15 MG PO TBCR
15.0000 mg | EXTENDED_RELEASE_TABLET | Freq: Two times a day (BID) | ORAL | Status: DC
  Administered 2013-09-08 – 2013-09-11 (×7): 15 mg via ORAL
  Filled 2013-09-08 (×7): qty 1

## 2013-09-08 MED ORDER — BENZONATATE 100 MG PO CAPS
200.0000 mg | ORAL_CAPSULE | Freq: Three times a day (TID) | ORAL | Status: DC
  Administered 2013-09-08 – 2013-09-11 (×11): 200 mg via ORAL
  Filled 2013-09-08 (×13): qty 2

## 2013-09-08 MED ORDER — SODIUM CHLORIDE 0.9 % IV SOLN: INTRAVENOUS | Status: DC

## 2013-09-08 MED ORDER — ALPRAZOLAM 0.5 MG PO TABS
1.0000 mg | ORAL_TABLET | Freq: Three times a day (TID) | ORAL | Status: DC | PRN
  Administered 2013-09-08 – 2013-09-11 (×8): 1 mg via ORAL
  Filled 2013-09-08 (×9): qty 2

## 2013-09-08 MED ORDER — METHYLPREDNISOLONE SODIUM SUCC 125 MG IJ SOLR: 60.0000 mg | Freq: Two times a day (BID) | INTRAMUSCULAR | Status: DC

## 2013-09-08 MED ORDER — GABAPENTIN 600 MG PO TABS
300.0000 mg | ORAL_TABLET | Freq: Three times a day (TID) | ORAL | Status: DC
  Filled 2013-09-08 (×3): qty 0.5

## 2013-09-08 MED ORDER — PREDNISONE 50 MG PO TABS
50.0000 mg | ORAL_TABLET | Freq: Every day | ORAL | Status: DC
  Administered 2013-09-09 – 2013-09-11 (×3): 50 mg via ORAL
  Filled 2013-09-08 (×4): qty 1

## 2013-09-08 NOTE — Progress Notes (Signed)
Name: Julie Kerr [place] MRN: [id number] DOB: 08/07/1976    ADMISSION DATE:  09/06/2013 CONSULTATION DATE:  1/23  REFERRING MD :  Criss AlvineGoldston PRIMARY SERVICE: PCCM CHIEF COMPLAINT:  Cough and dyspnea    BRIEF PATIENT DESCRIPTION:  This is a 38 year old former smoker (8 mo ago). W/ reported h/o asthma. Just discharged 1/19 for asthmatic exacerbation and bronchitis. Presents to the ER 1/22 w/ persistent cough and acute distress w/ marked progression of upper airway noises.   Clinical note:  >Has had 3 past intubations. 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   SIGNIFICANT EVENTS / STUDIES:  LE dopplers 1/23>>>  LINES / TUBES: PIV  CULTURES: U strep 1/23>>>neg Respiratory viral panel 1/23>>> positive for rhinovirus BC x 2 1/23>>>NGTD  ANTIBIOTICS: Zosyn 1/24>>> tamiflu 1/23>>>1/25  SUBJECTIVE:  The patient notes that her breathing is relatively unchanged this morning, but she didn't require any prn albuterol overnight.  The patient's respiratory virus panel returned positive for Rhinovirus.  The patient complains of leg pain this morning, described as a 36-month history of bilateral leg "shooting" pain with numbness, tingling, and weakness, s/p lumbar spine fusion about 7 months ago.  VITAL SIGNS: Temp:  [98.1 F (36.7 C)-99.6 F (37.6 C)] 98.9 F (37.2 C) (01/25 0400) Pulse Rate:  [53-86] 53 (01/25 0600) Resp:  [16-26] 18 (01/25 0600) BP: (104-132)/(45-86) 125/86 mmHg (01/25 0600) SpO2:  [97 %-100 %] 98 % (01/25 0600) Weight:  [185 lb 10 oz (84.2 kg)] 185 lb 10 oz (84.2 kg) (01/25 0400) Humidified face tent  HEMODYNAMICS:   VENTILATOR SETTINGS:   INTAKE / OUTPUT: Intake/Output     01/24 0701 - 01/25 0700 01/25 0701 - 01/26 0700   P.O. 4726    I.V. (mL/kg) 1688.3 (20.1)    IV Piggyback 400    Total Intake(mL/kg) 6814.3 (80.9)    Urine (mL/kg/hr) 4900 (2.4)    Total Output 4900     Net +1914.3          Urine  Occurrence 2 x    Stool Occurrence 2 x      PHYSICAL EXAMINATION: General: lying in bed, appears mildly uncomfortable HEENT: PERRL, EOMI, oropharynx non-erythematous Neck: supple Lungs: normal work of respiration, faint bilateral end-expiratory wheeze Heart: borderline bradycardic, regular rhythm, no m/g/r Abdomen: soft, non-tender, non-distended, normal bowel sounds Extremities: no cyanosis, clubbing, or edema, pt reports leg pain to touch diffusely though inconsistently Neurologic: alert & oriented X3, cranial nerves II-XII intact, strength in bilateral LE's 4/5 though significantly variable depending on prompting and patient effort  LABS:  CBC  Recent Labs Lab 09/06/13 1704 09/07/13 0305 09/08/13 0245  WBC 16.5* 15.4* 23.2*  HGB 10.6* 10.2* 10.7*  HCT 32.0* 31.3* 32.6*  PLT 351 328 336   Coag's No results found for this basename: APTT, INR,  in the last 168 hours BMET  Recent Labs Lab 09/06/13 1418 09/06/13 1704 09/07/13 0305 09/08/13 0245  NA 140  --  138 138  K 4.4  --  4.8 4.6  CL 103  --  104 102  CO2 22  --  19 22  BUN 14  --  10 9  CREATININE 0.68 0.68 0.63 0.64  GLUCOSE 121*  --  227* 137*   Electrolytes  Recent Labs Lab 09/06/13 1418 09/07/13 0305 09/08/13 0245  CALCIUM 9.6 9.1 9.5   Sepsis Markers  Recent Labs Lab 09/02/13 1850  LATICACIDVEN 2.04   ABG  Recent Labs Lab 09/02/13 1925 09/06/13 1508  PHART 7.419 7.455*  PCO2ART 36.5 32.2*  PO2ART 37.0* 369.0*   Liver Enzymes  Recent Labs Lab 09/03/13 0235  AST 9  ALT 8  ALKPHOS 61  BILITOT 0.2*  ALBUMIN 3.8   Cardiac Enzymes  Recent Labs Lab 09/02/13 2358 09/06/13 1703 09/06/13 2241 09/07/13 0305  TROPONINI  --  <0.30 <0.30 <0.30  PROBNP <5.0  --   --   --    Glucose  Recent Labs Lab 09/07/13 1110 09/07/13 1517 09/07/13 1922 09/08/13 0003 09/08/13 0346 09/08/13 0706  GLUCAP 291* 183* 260* 200* 158* 189*    Imaging Dg Chest Portable 1 View  09/06/2013    CLINICAL DATA:  Shortness of Breath  EXAM: PORTABLE CHEST - 1 VIEW  COMPARISON:  September 02, 2013  FINDINGS: Lungs are clear. The heart size and pulmonary vascularity are normal. No adenopathy. No pneumothorax. No bone lesions.  IMPRESSION: No abnormality noted.   Electronically Signed   By: Bretta Bang M.D.   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  Rhinovirus URI Refractory Cough P:   Wean off precedex as she can tolerate Cont aggressive nasal saline hygiene regimen  Cont aggressive reflux treatment    Brovana/budesonide w/ PRN SABA See ID section  Wean IV solumedrol to 60 BID today, then start Pred 50 tomorrow Start scheduled Tessalon to address cough  CARDIOVASCULAR A: Chest pain. Think that this is muscular skeletal from cough  Cardiac enzyme neg  Borderline Bradycardia - likely 2/2 precedex P:  -goal to wean off precedex today -narcotics for pain, transition to PO when able  RENAL A:  No acute  P:   D/c IVF this morning Keep even volume status  BMET in AM  GASTROINTESTINAL A:  Poorly controlled reflux: this is worsening her upper airway irritation  P:   PPI BID  HS pepcid q 6 hr reglan  Aspiration/reflux precautions   HEMATOLOGIC A:  Anemia  P:  [place] heparin  F/u cbc in am   INFECTIOUS A:  Rhinovirus URI P:   D/c Zosyn today D/c tamiflu, given negative flu  ENDOCRINE A:   hyperglycemia  P:   ssi  F/u A1C  NEUROLOGIC A:  Acute anxiety think this a huge contributing factor to her distress.  Chronic pain syndrome  Bilateral LE pain/weakness - chronic x7 months, s/p lumbar fusion 2014 P:   Wean off precedex drip, can try PO benzos to dridge Prn pain meds  Start gabapentin for neuropathic pain PT/OT consults for LE strengthening Start MS contin for pain Consider Ortho consult, given  persistent back pain driving recent ED visits/admissions, and lumbar spine x-ray showing superimposed fractured R S1 pedicle screw tip      Janalyn Harder, PGY3 Pgr. [phone] 09/08/2013, 8:00 AM   Levy Pupa, MD, PhD 09/08/2013, 1:00 PM [facility] [phone] or if no answer [phone]

## 2013-09-08 NOTE — Progress Notes (Signed)
Bilateral lower extremity venous duplex:  No evidence of DVT, superficial thrombosis, or Baker's Cyst.   

## 2013-09-09 ENCOUNTER — Inpatient Hospital Stay (HOSPITAL_COMMUNITY): Payer: Medicaid Other

## 2013-09-09 DIAGNOSIS — D649 Anemia, unspecified: Secondary | ICD-10-CM

## 2013-09-09 LAB — GLUCOSE, CAPILLARY
GLUCOSE-CAPILLARY: 144 mg/dL — AB (ref 70–99)
Glucose-Capillary: 121 mg/dL — ABNORMAL HIGH (ref 70–99)
Glucose-Capillary: 136 mg/dL — ABNORMAL HIGH (ref 70–99)
Glucose-Capillary: 137 mg/dL — ABNORMAL HIGH (ref 70–99)
Glucose-Capillary: 148 mg/dL — ABNORMAL HIGH (ref 70–99)
Glucose-Capillary: 179 mg/dL — ABNORMAL HIGH (ref 70–99)
Glucose-Capillary: 189 mg/dL — ABNORMAL HIGH (ref 70–99)

## 2013-09-09 LAB — BASIC METABOLIC PANEL
BUN: 11 mg/dL (ref 6–23)
CO2: 26 mEq/L (ref 19–32)
Calcium: 9 mg/dL (ref 8.4–10.5)
Chloride: 102 mEq/L (ref 96–112)
Creatinine, Ser: 0.75 mg/dL (ref 0.50–1.10)
GFR calc non Af Amer: >90 mL/min (ref >90)
Glucose, Bld: 141 mg/dL — ABNORMAL HIGH (ref 70–99)
POTASSIUM: 4.2 meq/L (ref 3.7–5.3)
Sodium: 140 mEq/L (ref 137–147)

## 2013-09-09 LAB — CBC
HEMATOCRIT: 33.2 % — AB (ref 36.0–46.0)
Hemoglobin: 10.4 g/dL — ABNORMAL LOW (ref 12.0–15.0)
MCH: 24.2 pg — ABNORMAL LOW (ref 26.0–34.0)
MCHC: 31.3 g/dL (ref 30.0–36.0)
MCV: 77.2 fL — ABNORMAL LOW (ref 78.0–100.0)
PLATELETS: 345 10*3/uL (ref 150–400)
RBC: 4.3 MIL/uL (ref 3.87–5.11)
RDW: 18 % — AB (ref 11.5–15.5)
WBC: 19.5 10*3/uL — AB (ref 4.0–10.5)

## 2013-09-09 LAB — HEMOGLOBIN A1C
HEMOGLOBIN A1C: 6 % — AB (ref <5.7)
Mean Plasma Glucose: 126 mg/dL — ABNORMAL HIGH (ref <117)

## 2013-09-09 MED ORDER — PIPERACILLIN-TAZOBACTAM 3.375 G IVPB
3.3750 g | Freq: Three times a day (TID) | INTRAVENOUS | Status: DC
  Administered 2013-09-09 – 2013-09-10 (×3): 3.375 g via INTRAVENOUS
  Filled 2013-09-09 (×5): qty 50

## 2013-09-09 MED ORDER — PANTOPRAZOLE SODIUM 40 MG PO TBEC
40.0000 mg | DELAYED_RELEASE_TABLET | Freq: Every day | ORAL | Status: DC
  Administered 2013-09-09 – 2013-09-11 (×3): 40 mg via ORAL
  Filled 2013-09-09 (×3): qty 1

## 2013-09-09 MED ORDER — HYDROMORPHONE HCL PF 1 MG/ML IJ SOLN
1.0000 mg | Freq: Once | INTRAMUSCULAR | Status: AC
  Administered 2013-09-09: 1 mg via INTRAVENOUS
  Filled 2013-09-09: qty 1

## 2013-09-09 MED ORDER — INSULIN ASPART 100 UNIT/ML ~~LOC~~ SOLN
0.0000 [IU] | Freq: Three times a day (TID) | SUBCUTANEOUS | Status: DC
  Administered 2013-09-10: 2 [IU] via SUBCUTANEOUS

## 2013-09-09 MED ORDER — OXYCODONE HCL 5 MG PO TABS
10.0000 mg | ORAL_TABLET | ORAL | Status: DC | PRN
  Administered 2013-09-09 – 2013-09-11 (×16): 10 mg via ORAL
  Filled 2013-09-09 (×17): qty 2

## 2013-09-09 MED ORDER — METOCLOPRAMIDE HCL 5 MG/ML IJ SOLN
5.0000 mg | Freq: Two times a day (BID) | INTRAMUSCULAR | Status: DC
  Administered 2013-09-10 – 2013-09-11 (×3): 5 mg via INTRAVENOUS
  Filled 2013-09-09: qty 2
  Filled 2013-09-09 (×2): qty 1
  Filled 2013-09-09: qty 2
  Filled 2013-09-09 (×3): qty 1

## 2013-09-09 MED ORDER — FAMOTIDINE 20 MG PO TABS
20.0000 mg | ORAL_TABLET | Freq: Every day | ORAL | Status: DC
  Administered 2013-09-09 – 2013-09-11 (×3): 20 mg via ORAL
  Filled 2013-09-09 (×3): qty 1

## 2013-09-09 NOTE — Progress Notes (Signed)
PT Cancellation Note  Patient Details Name: Julie Kerr MRN: [id number] DOB: 02/01/1976   Cancelled Treatment:    Reason Eval Not Completed: Pain limiting ability to participate. Pt crying in pain throughout interview for history, prior functional status, and home environment [facility] Pt refused any mobility due to pain. Reports her new pain medicine regimen (begun yesterday) is not controlling her pain at all. Dr. Danielle DessElsner in to assess pt and plans to order further imaging as she reports this leg pain began AFTER her back surgery in 11/2012.  Will await imaging results and proceed with evaluation as appropriate.  Otillia Cordone 09/09/2013, 12:55 PM Pager [phone]

## 2013-09-09 NOTE — Consult Note (Signed)
Reason for Consult: Back and bilateral leg pain Referring Physician: Dr. Kathie Dike Julie Kerr is an 38 y.o. female.  HPI: Patient is a 39 year old right-handed female who has a significant history of asthma with recent decompensation she tells me however that she's been troubled with back pain and bilateral leg pain since she had surgery in April 2014. This was performed in [facility] by Dr. Jimmye Norman. She notes she had a surgery previously which included a fusion. She was told that she had broken screws in her fusion and this apparently was repaired. In November of 2014 she underwent a plain x-ray of her lumbar spine X. [place] and this reveals the presence of an interbody arthrodesis at L5-S1 with pedicle screw fixation and a broken screw tip in the S1 pedicle on the right. She notes that after the surgery she had bilateral leg pain and she did not have this before the surgery. This has not gotten any better. She states that she has not had a CT or MRI since her surgery. She states that she cannot afford or tolerate the trip to [place] for further followup. She has recently moved to the [place] area.  Past Medical History  Diagnosis Date  . Asthma   . Anxiety   . COPD (chronic obstructive pulmonary disease)   . Bronchitis     Past Surgical History  Procedure Laterality Date  . Spinal fusion      No family history on file.  Social History:  reports that she has been smoking.  She has never used smokeless tobacco. She reports that she does not drink alcohol. Her drug history is not on file.  Allergies:  Allergies  Allergen Reactions  . Robitussin Dm [Dextromethorphan-Guaifenesin] Nausea And Vomiting  . Nsaids Hives  . Tramadol Hives    Medications: I have reviewed the patient's current medications.  Results for orders placed during the hospital encounter of 09/06/13 (from the past 48 hour(s))  GLUCOSE, CAPILLARY     Status: Abnormal   Collection Time    09/07/13   3:17 PM      Result Value Range   Glucose-Capillary 183 (*) 70 - 99 mg/dL  GLUCOSE, CAPILLARY     Status: Abnormal   Collection Time    09/07/13  7:22 PM      Result Value Range   Glucose-Capillary 260 (*) 70 - 99 mg/dL   Comment 1 Notify RN    GLUCOSE, CAPILLARY     Status: Abnormal   Collection Time    09/08/13 12:03 AM      Result Value Range   Glucose-Capillary 200 (*) 70 - 99 mg/dL   Comment 1 Notify RN    CBC     Status: Abnormal   Collection Time    09/08/13  2:45 AM      Result Value Range   WBC 23.2 (*) 4.0 - 10.5 K/uL   RBC 4.27  3.87 - 5.11 MIL/uL   Hemoglobin 10.7 (*) 12.0 - 15.0 g/dL   HCT 32.6 (*) 36.0 - 46.0 %   MCV 76.3 (*) 78.0 - 100.0 fL   MCH 25.1 (*) 26.0 - 34.0 pg   MCHC 32.8  30.0 - 36.0 g/dL   RDW 17.8 (*) 11.5 - 15.5 %   Platelets 336  150 - 400 K/uL  BASIC METABOLIC PANEL     Status: Abnormal   Collection Time    09/08/13  2:45 AM      Result Value Range  Sodium 138  137 - 147 mEq/L   Potassium 4.6  3.7 - 5.3 mEq/L   Chloride 102  96 - 112 mEq/L   CO2 22  19 - 32 mEq/L   Glucose, Bld 137 (*) 70 - 99 mg/dL   BUN 9  6 - 23 mg/dL   Creatinine, Ser 0.64  0.50 - 1.10 mg/dL   Calcium 9.5  8.4 - 10.5 mg/dL   GFR calc non Af Amer >90  >90 mL/min   GFR calc Af Amer >90  >90 mL/min   Comment: (NOTE)     The eGFR has been calculated using the CKD EPI equation.     This calculation has not been validated in all clinical situations.     eGFR's persistently <90 mL/min signify possible Chronic Kidney     Disease.  GLUCOSE, CAPILLARY     Status: Abnormal   Collection Time    09/08/13  3:46 AM      Result Value Range   Glucose-Capillary 158 (*) 70 - 99 mg/dL   Comment 1 Notify RN    GLUCOSE, CAPILLARY     Status: Abnormal   Collection Time    09/08/13  7:06 AM      Result Value Range   Glucose-Capillary 189 (*) 70 - 99 mg/dL  HEMOGLOBIN A1C     Status: Abnormal   Collection Time    09/08/13  9:44 AM      Result Value Range   Hemoglobin A1C 5.8  (*) <5.7 %   Comment: (NOTE)                                                                               According to the ADA Clinical Practice Recommendations for 2011, when     HbA1c is used as a screening test:      >=6.5%   Diagnostic of Diabetes Mellitus               (if abnormal result is confirmed)     5.7-6.4%   Increased risk of developing Diabetes Mellitus     References:Diagnosis and Classification of Diabetes Mellitus,Diabetes     VWUJ,8119,14(NWGNF 1):S62-S69 and Standards of Medical Care in             Diabetes - 2011,Diabetes Care,2011,34 (Suppl 1):S11-S61.   Mean Plasma Glucose 120 (*) <117 mg/dL   Comment: Performed at [facility], CAPILLARY     Status: Abnormal   Collection Time    09/08/13 11:06 AM      Result Value Range   Glucose-Capillary 159 (*) 70 - 99 mg/dL  GLUCOSE, CAPILLARY     Status: Abnormal   Collection Time    09/08/13  3:31 PM      Result Value Range   Glucose-Capillary 104 (*) 70 - 99 mg/dL  GLUCOSE, CAPILLARY     Status: Abnormal   Collection Time    09/08/13  7:16 PM      Result Value Range   Glucose-Capillary 140 (*) 70 - 99 mg/dL   Comment 1 Notify RN    GLUCOSE, CAPILLARY     Status: Abnormal   Collection Time    09/09/13  12:06 AM      Result Value Range   Glucose-Capillary 179 (*) 70 - 99 mg/dL   Comment 1 Notify RN    CBC     Status: Abnormal   Collection Time    09/09/13  3:28 AM      Result Value Range   WBC 19.5 (*) 4.0 - 10.5 K/uL   RBC 4.30  3.87 - 5.11 MIL/uL   Hemoglobin 10.4 (*) 12.0 - 15.0 g/dL   HCT 33.2 (*) 36.0 - 46.0 %   MCV 77.2 (*) 78.0 - 100.0 fL   MCH 24.2 (*) 26.0 - 34.0 pg   MCHC 31.3  30.0 - 36.0 g/dL   RDW 18.0 (*) 11.5 - 15.5 %   Platelets 345  150 - 400 K/uL  BASIC METABOLIC PANEL     Status: Abnormal   Collection Time    09/09/13  3:28 AM      Result Value Range   Sodium 140  137 - 147 mEq/L   Potassium 4.2  3.7 - 5.3 mEq/L   Chloride 102  96 - 112 mEq/L   CO2 26  19 - 32  mEq/L   Glucose, Bld 141 (*) 70 - 99 mg/dL   BUN 11  6 - 23 mg/dL   Creatinine, Ser 0.75  0.50 - 1.10 mg/dL   Calcium 9.0  8.4 - 10.5 mg/dL   GFR calc non Af Amer >90  >90 mL/min   GFR calc Af Amer >90  >90 mL/min   Comment: (NOTE)     The eGFR has been calculated using the CKD EPI equation.     This calculation has not been validated in all clinical situations.     eGFR's persistently <90 mL/min signify possible Chronic Kidney     Disease.  HEMOGLOBIN A1C     Status: Abnormal   Collection Time    09/09/13  3:28 AM      Result Value Range   Hemoglobin A1C 6.0 (*) <5.7 %   Comment: (NOTE)                                                                               According to the ADA Clinical Practice Recommendations for 2011, when     HbA1c is used as a screening test:      >=6.5%   Diagnostic of Diabetes Mellitus               (if abnormal result is confirmed)     5.7-6.4%   Increased risk of developing Diabetes Mellitus     References:Diagnosis and Classification of Diabetes Mellitus,Diabetes     KGMW,1027,25(DGUYQ 1):S62-S69 and Standards of Medical Care in             Diabetes - 2011,Diabetes Care,2011,34 (Suppl 1):S11-S61.   Mean Plasma Glucose 126 (*) <117 mg/dL   Comment: Performed at [facility], CAPILLARY     Status: Abnormal   Collection Time    09/09/13  3:39 AM      Result Value Range   Glucose-Capillary 136 (*) 70 - 99 mg/dL   Comment 1 Notify RN    GLUCOSE, CAPILLARY  Status: Abnormal   Collection Time    09/09/13  7:47 AM      Result Value Range   Glucose-Capillary 148 (*) 70 - 99 mg/dL  GLUCOSE, CAPILLARY     Status: Abnormal   Collection Time    09/09/13 12:21 PM      Result Value Range   Glucose-Capillary 137 (*) 70 - 99 mg/dL    No results found.  Review of Systems  HENT: Negative.   Eyes: Negative.   Respiratory: Positive for shortness of breath.   Cardiovascular: Negative.   Gastrointestinal: Negative.   Genitourinary:  Negative.   Musculoskeletal: Positive for back pain.  Skin: Negative.   Neurological: Positive for weakness.       Bilateral lower extremity pain since surgery April 2014  Endo/Heme/Allergies: Negative.   Psychiatric/Behavioral: Negative.    Blood pressure 118/64, pulse 69, temperature 99.6 F (37.6 C), temperature source Oral, resp. rate 19, height 5' 1.81" (1.57 m), weight 85.5 kg (188 lb 7.9 oz), last menstrual period 08/23/2013, SpO2 100.00%. Physical Exam  Constitutional: She appears well-developed and well-nourished.  HENT:  Head: Normocephalic and atraumatic.  Eyes: Conjunctivae and EOM are normal. Pupils are equal, round, and reactive to light.  Neck: Normal range of motion. Neck supple.  Musculoskeletal:  Intact movement of both lower extremities however patient substantial pain with any movement of back. Palpation and percussion reproduce his centralized localize pain. Patient did not ambulate for me today.  Skin: Skin is warm and dry.  Psychiatric:  Anxious, depressed    Assessment/Plan: Back pain with bilateral lumbar radiculopathy. Further workup will include imaging studies including a CT of the lumbar spine in addition to an MRI of the lumbar spine. Not sure anything surgical can be offered for the patient but once the imaging studies are performed we can better assess the stability of her lumbar spine any ongoing neural compromise. If no surgical solutions are identified then she will require pain management. Pain management may get involved at this time also.  Tyishia Aune J 09/09/2013, 12:57 PM

## 2013-09-09 NOTE — Progress Notes (Signed)
Name: Julie Kerr MRN: [id number] DOB: 1976-01-03    ADMISSION DATE:  09/06/2013 CONSULTATION DATE:  1/23  REFERRING MD :  Criss Alvine PRIMARY SERVICE: PCCM CHIEF COMPLAINT:  Cough and dyspnea    BRIEF PATIENT DESCRIPTION:  This is a 38 year old former smoker (8 mo ago). W/ reported h/o asthma. Just discharged 1/19 for asthmatic exacerbation and bronchitis. Presents to the ER 1/22 w/ persistent cough and acute distress w/ marked progression of upper airway noises, found to have rhinovirus.  Clinical note:  >Has had 3 past intubations. 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   SIGNIFICANT EVENTS / STUDIES:  LE dopplers 1/23>>>neg  LINES / TUBES: PIV  CULTURES: U strep 1/23>>>neg Respiratory viral panel 1/23>>> positive for rhinovirus BC x 2 1/23>>>NGTD  ANTIBIOTICS: Zosyn 1/24>>>1/25 tamiflu 1/23>>>1/25  SUBJECTIVE:  Precedex drip weaned off yesterday.  The patient had one panic attack overnight, which started as worsening of her chronic LE pain, then progressed to bilateral diffuse chest tightness and dyspnea, which resolved with morphine and albuterol.  EKG unremarkable.  VITAL SIGNS: Temp:  [97.6 F (36.4 C)-99.1 F (37.3 C)] 97.6 F (36.4 C) (01/26 0421) Pulse Rate:  [56-109] 73 (01/26 0630) Resp:  [15-30] 15 (01/26 0630) BP: (76-132)/(45-80) 103/57 mmHg (01/26 0630) SpO2:  [97 %-100 %] 100 % (01/26 0630) Weight:  [188 lb 7.9 oz (85.5 kg)] 188 lb 7.9 oz (85.5 kg) (01/26 0400)  HEMODYNAMICS:   VENTILATOR SETTINGS:   INTAKE / OUTPUT: Intake/Output     01/25 0701 - 01/26 0700   P.O. 4446   I.V. (mL/kg) 735.6 (8.6)   IV Piggyback 250   Total Intake(mL/kg) 5431.6 (63.5)   Urine (mL/kg/hr) 5125 (2.5)   Total Output 5125   Net +306.6       Urine Occurrence 1 x     PHYSICAL EXAMINATION: General: lying in bed, appears mildly uncomfortable HEENT: PERRL, EOMI, oropharynx non-erythematous Neck: supple Lungs:  normal work of respiration, faint bilateral end-expiratory wheeze gone Heart: borderline bradycardic, regular rhythm, no m/g/r Abdomen: soft, non-tender, non-distended, normal bowel sounds Extremities: no edema, pt reports leg pain to touch diffusely though inconsistently Neurologic: alert & oriented X3, cranial nerves II-XII intact, strength in bilateral LE's 4/5 though significantly variable depending on prompting and patient effort  LABS:  CBC  Recent Labs Lab 09/07/13 0305 09/08/13 0245 09/09/13 0328  WBC 15.4* 23.2* 19.5*  HGB 10.2* 10.7* 10.4*  HCT 31.3* 32.6* 33.2*  PLT 328 336 345   Coag's No results found for this basename: APTT, INR,  in the last 168 hours BMET  Recent Labs Lab 09/07/13 0305 09/08/13 0245 09/09/13 0328  NA 138 138 140  K 4.8 4.6 4.2  CL 104 102 102  CO2 19 22 26   BUN 10 9 11   CREATININE 0.63 0.64 0.75  GLUCOSE 227* 137* 141*   Electrolytes  Recent Labs Lab 09/07/13 0305 09/08/13 0245 09/09/13 0328  CALCIUM 9.1 9.5 9.0   Sepsis Markers  Recent Labs Lab 09/02/13 1850  LATICACIDVEN 2.04   ABG  Recent Labs Lab 09/02/13 1925 09/06/13 1508  PHART 7.419 7.455*  PCO2ART 36.5 32.2*  PO2ART 37.0* 369.0*   Liver Enzymes  Recent Labs Lab 09/03/13 0235  AST 9  ALT 8  ALKPHOS 61  BILITOT 0.2*  ALBUMIN 3.8   Cardiac Enzymes  Recent Labs Lab 09/02/13 2358 09/06/13 1703 09/06/13 2241 09/07/13 0305  TROPONINI  --  <0.30 <  0.30 <0.30  PROBNP <5.0  --   --   --    Glucose  Recent Labs Lab 09/08/13 0706 09/08/13 1106 09/08/13 1531 09/08/13 1916 09/09/13 0006 09/09/13 0339  GLUCAP 189* 159* 104* 140* 179* 136*    Imaging No results found.  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  Rhinovirus URI Refractory Cough P:   Off precedex Cont aggressive reflux treatment     Brovana/budesonide w/ PRN SABA See ID section  Wean steroids to pred 50 today continue scheduled Tessalon to address cough No film needed ENT as outpt after viral syndome resolved  CARDIOVASCULAR A: Chest tightness - likely panic attack P:  -narcotics for pain, transition to PO when able -xanax prn for chest tightness -tele ecg neg  RENAL A:  No acute  P:   Keep even volume status Saline lock BMET in AM  GASTROINTESTINAL A:  Poorly controlled reflux: this is worsening her upper airway irritation  P:   PPI BID  HS pepcid to po q6 hr reglan, limit in young female, reduce, doubt would use this longterm  Aspiration/reflux precautions   HEMATOLOGIC A:  Anemia  P:  Artas heparin  F/u cbc in am   INFECTIOUS A:  Rhinovirus URI P:   -allow zosyn to dc, done day prior  ENDOCRINE A:   hyperglycemia - steroid-induced.  A1C = 5.8 P:   ssi   NEUROLOGIC A:  Acute anxiety think this a huge contributing factor to her distress.  Chronic pain syndrome  Bilateral LE pain/weakness - chronic x7 months, s/p lumbar fusion 2014 P:   Continue xanax prn Prn pain meds  continue gabapentin for neuropathic pain PT/OT consults for LE strengthening continue MS contin for pain Consider Ortho consult, given persistent back pain driving recent ED visits/admissions, and lumbar spine x-ray showing superimposed fractured R S1 pedicle screw tip, will call ortho pre dc  Janalyn Harderyan Brown, PGY3 Pgr. [phone] 09/09/2013, 6:43 AM  To med floor  I have fully examined this patient and agree with above findings.    And edited in full  Mcarthur RossettiDaniel J. Tyson AliasFeinstein, MD, FACP Pgr: [phone] [facility]

## 2013-09-09 NOTE — Significant Event (Signed)
Pt c/o severe leg pains not improved with morphine.  Will give dilaudid 1 mg IV x one.  Defer to AM rounding team to further assess neuropathic pain regimen.  Coralyn HellingVineet Zenas Santa, MD 09/09/2013, 10:26 PM

## 2013-09-10 ENCOUNTER — Inpatient Hospital Stay (HOSPITAL_COMMUNITY): Payer: Medicaid Other

## 2013-09-10 LAB — GLUCOSE, CAPILLARY
GLUCOSE-CAPILLARY: 150 mg/dL — AB (ref 70–99)
Glucose-Capillary: 109 mg/dL — ABNORMAL HIGH (ref 70–99)
Glucose-Capillary: 138 mg/dL — ABNORMAL HIGH (ref 70–99)
Glucose-Capillary: 135 mg/dL — ABNORMAL HIGH (ref 70–99)

## 2013-09-10 MED ORDER — GADOBENATE DIMEGLUMINE 529 MG/ML IV SOLN
20.0000 mL | Freq: Once | INTRAVENOUS | Status: AC
  Administered 2013-09-10: 18 mL via INTRAVENOUS

## 2013-09-10 MED ORDER — GABAPENTIN 300 MG PO CAPS
600.0000 mg | ORAL_CAPSULE | Freq: Three times a day (TID) | ORAL | Status: DC
  Administered 2013-09-10 – 2013-09-11 (×4): 600 mg via ORAL
  Filled 2013-09-10 (×6): qty 2

## 2013-09-10 NOTE — Evaluation (Signed)
Occupational Therapy Evaluation Patient Details Name: Julie Kerr MRN: [id number] DOB: 21-Mar-1976 Today's Date: 09/10/2013 Time: 4098-1191 OT Time Calculation (min): 23 min  OT Assessment / Plan / Recommendation History of present illness Pt is a 38y/o female admitted w/ primary issue seems to be refractory cough, chronic pain and anxiety .  Difficult to tell if there is any true bronchospasm at all. The primary contributing factors here are a) persistent nasal gtt b) worsening and poorly controlled reflux and c) some degree of anxiety/chronic pain. 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  High risk as multiple hospital admission with 3 previous intubation.  Will treat possible sinusitis and although not convinced this is asthma will cont systemic steroids and scheduled BDs. Patient is a 38 year old right-handed female who has a significant history of asthma with recent decompensation she tells me however that she's been troubled with back pain and bilateral leg pain since she had surgery in April 2014. This was performed in [facility] by Dr. Mayford Knife. She notes she had a surgery previously which included a fusion. She was told that she had broken screws in her fusion and this apparently was repaired. In November of 2014 she underwent a plain x-ray of her lumbar spine X. Kimball and this reveals the presence of an interbody arthrodesis at L5-S1 with pedicle screw fixation and a broken screw tip in the S1 pedicle on the right. She notes that after the surgery she had bilateral leg pain and she did not have this before the surgery. This has not gotten any better. She states that she has not had a CT or MRI since her surgery. She states that she cannot afford or tolerate the trip to [place] for further followup. She has recently  moved to the [place] area.   Clinical Impression   Pt is a 38y/o female admitted as above. She has significant deficits in the ability to care for herself w/ regards to ADL's, functional transfers and homemaking tasks. Pt should benefit from CIR consult to determine is she would be appropriate for intensive in-pt rehab to maximize independence w/ ADL's and transfers for eventual d/c home w/ children ages 40 & 71 as well as caretaker assist (Mon-Sun per her report). Will follow acutely for OT.    OT Assessment  Patient needs continued OT Services    Follow Up Recommendations  CIR    Barriers to Discharge   Pt reports that her children ages 80 & 11 assist in her care for dressing and are currently home taking "care of themselves" Social work & RN notified.   Equipment Recommendations  Other (comment) (A/E)    Recommendations for Other Services Rehab consult  Frequency  Min 2X/week    Precautions / Restrictions Precautions Precautions: Fall;Other (comment);Back (Pt w/ Droplet precautions as indicated on her door, no orders in pt chart however) Precaution Comments: Back precautions for comfort Restrictions Weight Bearing Restrictions: No   Pertinent Vitals/Pain 10/10 back pain, Activity, Repositioned in bed, resting and RN made aware, gave pain medication prior to session.    ADL  Grooming: Simulated;Set up;Modified independent Where Assessed - Grooming: Supine, head of bed up Upper Body Bathing: Simulated;Set up Where Assessed - Upper Body Bathing: Supine, head of bed up Lower Body Bathing: Simulated;Minimal assistance Where Assessed - Lower Body Bathing: Supported sitting;Supported sit to stand  Upper Body Dressing: Simulated;Set up;Minimal assistance Where Assessed - Upper Body Dressing: Unsupported sitting Lower Body Dressing: Performed;+2 Total assistance (Min A don socks, +2 Mod A sit to stand) Lower Body Dressing: Patient Percentage: 70% Where Assessed - Lower Body  Dressing: Unsupported sitting;Supported sit to stand Toilet Transfer: Simulated;+2 Total assistance (+2 Mod A for safety) Toilet Transfer: Patient Percentage: 70% Toilet Transfer Method: Sit to stand;Stand pivot (Sit to stand from EOB) AcupuncturistToilet Transfer Equipment: Bedside commode Toileting - Clothing Manipulation and Hygiene: Simulated;+2 Total assistance Toileting - ArchitectClothing Manipulation and Hygiene: Patient Percentage: 70% Where Assessed - Engineer, miningToileting Clothing Manipulation and Hygiene: Standing Tub/Shower Transfer Method: Not assessed Equipment Used: Rolling walker Transfers/Ambulation Related to ADLs: Pt overall +2 Mod A for sit to stand activities. Pt with difficulty advancing LE's to side step to head of bed (required Assistance to advance feet), using RW. ADL Comments: Pt is a 38 y/o female currently requiring increased assistance w/ ADL and functional transfers. Pt very tearful/crying throughout session especially when asked whom her children were staying with while she is in the hospital, pt noted to cry and stated "They are taking care of themselves, I don't want to talk about it." Children at 6617 & 36 y/o. Pt also states that her children assist with dressing daily. Pt was educated in role of OT and ADL's as well as back precautions for comfort and participated in transfers and bed mobility as precursor to increased participation in ADL's.    OT Diagnosis: Generalized weakness;Acute pain  OT Problem List: Decreased activity tolerance;Impaired balance (sitting and/or standing);Decreased knowledge of precautions;Decreased knowledge of use of DME or AE;Pain;Cardiopulmonary status limiting activity OT Treatment Interventions: Self-care/ADL training;Patient/family education;DME and/or AE instruction;Therapeutic activities;Balance training   OT Goals(Current goals can be found in the care plan section) Acute Rehab OT Goals Patient Stated Goal: Decreased back pain Time For Goal Achievement:  09/24/13 Potential to Achieve Goals: Good  Visit Information  Last OT Received On: 09/10/13 Assistance Needed: +2 History of Present Illness: Pt is a 38y/o female admitted w/ primary issue seems to be refractory cough, chronic pain and anxiety .  Difficult to tell if there is any true bronchospasm at all. The primary contributing factors here are a) persistent nasal gtt b) worsening and poorly controlled reflux and c) some degree of anxiety/chronic pain. 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  High risk as multiple hospital admission with 3 previous intubation.  Will treat possible sinusitis and although not convinced this is asthma will cont systemic steroids and scheduled BDs. Patient is a 38 year old right-handed female who has a significant history of asthma with recent decompensation she tells me however that she's been troubled with back pain and bilateral leg pain since she had surgery in April 2014. [facility]. Mayford KnifeWilliams. She notes she had a surgery previously which included a fusion. She was told that she had broken screws in her fusion and this apparently was repaired. In November of 2014 she underwent a plain x-ray of her lumbar spine X. [place]lap and this reveals the presence of an interbody arthrodesis at L5-S1 with pedicle screw fixation and a broken screw tip in the S1 pedicle on the right. She notes that after the surgery she had bilateral leg pain and she did not have this before the surgery. This  has not gotten any better. She states that she has not had a CT or MRI since her surgery. She states that she cannot afford or tolerate the trip to [place] for further followup. She has recently moved to the [place] area.       Prior Functioning     Home Living Family/patient expects  to be discharged to:: Private residence Living Arrangements: Children;Other (Comment) (Children ages 58 & 77 y/o) Available Help at Discharge: Family;Friend(s);Personal care attendant Type of Home: House Home Access: Stairs to enter [employer] of Steps: 3 Entrance Stairs-Rails: None Home Layout: One level Home Equipment: Walker - standard;Shower seat;Bedside commode Prior Function Level of Independence: Needs assistance Gait / Transfers Assistance Needed: Assistance with stairs; Amb with walker ADL's / Homemaking Assistance Needed: Pt states that he children assist with ADL's and homemaking. "My kids dress me" Comments: Pt tearful throughout session, asking to please not talk about her children when asked whom is taking care of them while pt is in hospital. Pt stated "They're taking care of themselves, I don't want to talk about it." Crying. Pt agreeable to Chaplain visit. Communication Communication: No difficulties Dominant Hand: Right        Cognition  Cognition Arousal/Alertness: Awake/alert Behavior During Therapy: Anxious (Pt tearful, crying throughout assessment. Asking not to discuss her children) Overall Cognitive Status: Within Functional Limits for tasks assessed    Extremity/Trunk Assessment Upper Extremity Assessment Upper Extremity Assessment: Overall WFL for tasks assessed Lower Extremity Assessment Lower Extremity Assessment: Defer to PT evaluation     Mobility Bed Mobility Overal bed mobility: Needs Assistance Bed Mobility: Sidelying to Sit;Supine to Sit;Sit to Supine;Sit to Sidelying;Rolling Rolling: Min assist;+2 for safety/equipment;+2 for physical assistance Sidelying to sit: Min assist;+2 for safety/equipment;+2 for physical assistance Sit to sidelying: Min assist;+2 for physical assistance;+2 for safety/equipment General bed mobility comments: VC's for safety, hand placement and back precautions for comfort/log roll etc. Min assist to guide UB  and LB during bed mobility secondary to pt 10/10 back pain. Transfers Overall transfer level: Needs assistance Equipment used: Rolling walker (2 wheeled) Transfers: Sit to/from Stand Sit to Stand: +2 physical assistance;+2 safety/equipment;Min assist;Mod assist General transfer comment: Pt w/ difficulty bearing weight and advancing LE's w/ side step while repositioning in bed using RW. Pt required 2 person assist (one at LE's and one UB w/ RW). Pt crying throughout session secondary to pain.        Balance Balance Overall balance assessment: Needs assistance Sitting-balance support: Bilateral upper extremity supported;Feet supported Sitting balance-Leahy Scale: Good Standing balance support: Bilateral upper extremity supported Standing balance-Leahy Scale: Fair Standing balance comment: Pt required 2 person assist w/ RW and physical assist at LE's and UE's with side steps to [facility] w/ RW.   End of Session OT - End of Session Equipment Utilized During Treatment: Rolling walker Activity Tolerance: Patient limited by pain Patient left: in bed;with call bell/phone within reach Nurse Communication: Mobility status;Other (comment) (Pt c/o 10/10 back pain, RN had given pain medication prior to session.)  GO     Alm Bustard 09/10/2013, 10:57 AM

## 2013-09-10 NOTE — Progress Notes (Signed)
Note: This document was prepared with digital dictation and possible smart phrase technology. Any transcriptional errors that result from this process are unintentional.   Julie Kerr RUE:[id number] DOB: [birth year] DOA: 09/06/2013 PCP: Oralia Rud, MD  Brief narrative: 38 y/o ?, known Asthma , current chronic Tob, chronic pain + significant anxiety, Poorly controlled GERD, steroid induced hyperglycemiaVocal chord dysfunction>COPD-admitted to PCCM with Hypoxic resp failure 09/02/13-placed on BIPAP-d/c home 1/20 on oral steroids/Levaquin and returned 09/06/13 with severe respiratory distress  Consultants:  Dr Ellene Route NS  SIGNIFICANT EVENTS / STUDIES:   LE dopplers 1/23>>>neg  LINES / TUBES:  PIV  CULTURES:   U strep 1/23>>>neg   Respiratory viral panel 1/23>>> positive for rhinovirus   BC x 2 1/23>>>NGTD  ANTIBIOTICS:   Zosyn 1/24>>>1/25   tamiflu 1/23>>>1/25    Subjective  Doing fair Tearful Pain moderate. Upset at current situation States CP   Objective    Interim History: none  Telemetry: none   Objective: Filed Vitals:   09/09/13 2011 09/09/13 2242 09/10/13 0500 09/10/13 0849  BP: 127/73  131/80   Pulse: 88  93   Temp: 99 F (37.2 C)  98.9 F (37.2 C)   TempSrc: Oral  Oral   Resp: 20  19   Height:      Weight:   87.1 kg (192 lb 0.3 oz)   SpO2:  97% 98% 99%    Intake/Output Summary (Last 24 hours) at 09/10/13 1110 Last data filed at 09/09/13 2300  Gross per 24 hour  Intake   1330 ml  Output   2500 ml  Net  -1170 ml    Exam:  General: eomi, ncat Cardiovascular: s1 s2 no m/r/g Respiratory: clear Abdomen: no added sound Skin no LE edema Neuro intact   Data Reviewed: Basic Metabolic Panel:  Recent Labs Lab 09/06/13 1418 09/06/13 1704 09/07/13 0305 09/08/13 0245 09/09/13 0328  NA 140  --  138 138 140  K 4.4  --  4.8 4.6 4.2  CL 103  --  104 102 102  CO2 22  --  _0 GLUCOSE 121*  --  227* 137* 141*  BUN 14  --  _1 CREATININE 0.68 0.68 0.63 0.64 0.75  CALCIUM 9.6  --  9.1 9.5 9.0   Liver Function Tests: No results found for this basename: AST, ALT, ALKPHOS, BILITOT, PROT, ALBUMIN,  in the last 168 hours No results found for this basename: LIPASE, AMYLASE,  in the last 168 hours No results found for this basename: AMMONIA,  in the last 168 hours CBC:  Recent Labs Lab 09/06/13 1418 09/06/13 1704 09/07/13 0305 09/08/13 0245 09/09/13 0328  WBC 9.8 16.5* 15.4* 23.2* 19.5*  NEUTROABS 5.9  --   --   --   --   HGB 12.5 10.6* 10.2* 10.7* 10.4*  HCT 37.3 32.0* 31.3* 32.6* 33.2*  MCV 76.0* 75.5* 75.2* 76.3* 77.2*  PLT 422* 351 328 336 345   Cardiac Enzymes:  Recent Labs Lab 09/06/13 1703 09/06/13 2241 09/07/13 0305  TROPONINI <0.30 <0.30 <0.30   BNP: No components found with this basename: POCBNP,  CBG:  Recent Labs Lab 09/09/13 1221 09/09/13 1601 09/09/13 1900 09/09/13 2141 09/10/13 0642  GLUCAP 137* 189* 144* 121* 109*    Recent Results (from the past 240 hour(s))  MRSA PCR SCREENING     Status: None   Collection Time    09/03/13 12:05 AM      Result Value Range  Status   MRSA by PCR NEGATIVE  NEGATIVE Final   Comment:            The GeneXpert MRSA Assay (FDA     approved for NASAL specimens     only), is one component of a     comprehensive MRSA colonization     surveillance program. It is not     intended to diagnose MRSA     infection nor to guide or     monitor treatment for     MRSA infections.  CULTURE, BLOOD (ROUTINE X 2)     Status: None   Collection Time    09/06/13  5:05 PM      Result Value Range Status   Specimen Description BLOOD ARM LEFT   Final   Special Requests BOTTLES DRAWN AEROBIC AND ANAEROBIC 10CC   Final   Culture  Setup Time     Final   Value: 09/06/2013 22:18     Performed at [facility]   Culture     Final   Value:        BLOOD CULTURE RECEIVED NO GROWTH TO DATE CULTURE WILL BE HELD FOR 5 DAYS BEFORE ISSUING A FINAL NEGATIVE  REPORT     Performed at [facility]   Report Status PENDING   Incomplete  CULTURE, BLOOD (ROUTINE X 2)     Status: None   Collection Time    09/06/13  5:15 PM      Result Value Range Status   Specimen Description BLOOD HAND LEFT   Final   Special Requests BOTTLES DRAWN AEROBIC AND ANAEROBIC 10CC   Final   Culture  Setup Time     Final   Value: 09/06/2013 22:17     Performed at [facility]   Culture     Final   Value:        BLOOD CULTURE RECEIVED NO GROWTH TO DATE CULTURE WILL BE HELD FOR 5 DAYS BEFORE ISSUING A FINAL NEGATIVE REPORT     Performed at [facility]   Report Status PENDING   Incomplete  MRSA PCR SCREENING     Status: None   Collection Time    09/06/13  6:38 PM      Result Value Range Status   MRSA by PCR NEGATIVE  NEGATIVE Final   Comment:            The GeneXpert MRSA Assay (FDA     approved for NASAL specimens     only), is one component of a     comprehensive MRSA colonization     surveillance program. It is not     intended to diagnose MRSA     infection nor to guide or     monitor treatment for     MRSA infections.  RESPIRATORY VIRUS PANEL     Status: Abnormal   Collection Time    09/06/13  6:46 PM      Result Value Range Status   Source - RVPAN NASAL SWAB   Corrected   Comment: CORRECTED ON 01/25 AT 0315: PREVIOUSLY REPORTED AS NASAL SWAB   Respiratory Syncytial Virus A NOT DETECTED   Final   Respiratory Syncytial Virus B NOT DETECTED   Final   Influenza A NOT DETECTED   Final   Influenza B NOT DETECTED   Final   Parainfluenza 1 NOT DETECTED   Final   Parainfluenza 2 NOT DETECTED   Final   Parainfluenza  3 NOT DETECTED   Final   Metapneumovirus NOT DETECTED   Final   Rhinovirus DETECTED (*)  Final   Adenovirus NOT DETECTED   Final   Influenza A H1 NOT DETECTED   Final   Influenza A H3 NOT DETECTED   Final   Comment: (NOTE)           Normal Reference Range for each Analyte: NOT DETECTED     Testing performed using the  Luminex xTAG Respiratory Viral Panel test     kit.     This test was developed and its performance characteristics determined     by [facility]. It has not been cleared or approved by the [place]     Food and Drug Administration. This test is used for clinical purposes.     It should not be regarded as investigational or for research. This     laboratory is certified under the Branch (CLIA) as qualified to perform high complexity     clinical laboratory testing.     Performed at [facility]     Studies:              All Imaging reviewed and is as per above notation   Scheduled Meds: . arformoterol  15 mcg Nebulization Q12H  . benzonatate  200 mg Oral TID  . budesonide (PULMICORT) nebulizer solution  0.25 mg Nebulization Q12H  . famotidine  20 mg Oral Daily  . feeding supplement (ENSURE COMPLETE)  237 mL Oral BID BM  . fluticasone  2 spray Each Nare BID  . gabapentin  600 mg Oral TID  . heparin  5,000 Units Subcutaneous Q8H  . insulin aspart  0-15 Units Subcutaneous TID WC  . metoCLOPramide (REGLAN) injection  5 mg Intravenous Q12H  . montelukast  10 mg Oral Daily  . morphine  15 mg Oral Q12H  . oxymetazoline  1 spray Each Nare BID  . pantoprazole  40 mg Oral Daily  . piperacillin-tazobactam (ZOSYN)  IV  3.375 g Intravenous Q8H  . predniSONE  50 mg Oral Q breakfast  . sodium chloride  2 spray Each Nare QID   Continuous Infusions:    Assessment/Plan: 1. Acute asthmatic/COPD flare in setting of vocal cord dysfunction-Currently stable-Brovana/budesonide w/ PRN SABA.  COnt Prednisone 50 mg daily for 5 days then stop.  Needs ENT input as OP 2. CP-Anxiety related-no further work-up 3. Poorly controlled reflux-QHS pepcid 20 mg, daily protonix 40 mg-Consider D/c reglan in am. 4. Steroid induced Hyperglycemia-A1c 5.8-as all sugars below 200. D/c ssi coverage and cbg checks 5. Chronic pain-psychosocial stressors-Chaplain,  Social work input--Await further input for potential needs for surgery from Dr. Ellene Route.  ?Can she go to CIR if she has no one at home with her pos-CIR stay?  Appreciate PT/OT input-continue for now MS contin 15 q 12, OXy IR 10 q3 prn, Gabapentin increased 1/27 to 600 tid   Code Status: FULL Family Communication:  None present Disposition Plan: Inpatient   Verneita Griffes, MD  [facility] Pager [phone] 09/10/2013, 11:10 AM    LOS: 4 days

## 2013-09-10 NOTE — Progress Notes (Signed)
Chaplain paged to provide follow up support to patient. Chaplain consulted with patient's nurse. Per nurse, patient has no pastoral needs and asked that chaplain come another time. Chaplain will follow up if needed or requested.

## 2013-09-10 NOTE — Progress Notes (Signed)
Chaplain responded to a referral for emotional support.  Pt has various medical stress points, but also multiple family / social ones as well.  Pt was calm and alert, open to communicate her thoughts, feelings and desires and expressed a sense of hope in making some important changes in her life.  Chaplain offered empathetic listening, scripture and prayer as emotional and spiritual support for the patient.  Chaplain is available for further consult if desired.    09/10/13 1130  Clinical Encounter Type  Visited With Patient;Health care provider  Visit Type Initial;Psychological support;Other (Comment) (Pain)  Referral From Nurse  Spiritual Encounters  Spiritual Needs Prayer;Emotional;Sacred text  Stress Factors  Patient Stress Factors Exhausted;Family relationships;Financial concerns;Lack of caregivers;Loss of control  Family Stress Factors Financial concerns    [place] Systems[place] Tameah Kerr, [address] Hospital Roadhaplain

## 2013-09-10 NOTE — Evaluation (Signed)
Physical Therapy Evaluation Patient Details Name: Julie Kerr MRN: [id number] DOB: 03/17/1976 Today's Date: 09/10/2013 Time: [id number] PT Time Calculation (min): 23 min  PT Assessment / Plan / Recommendation History of Present Illness  Pt is a 37y/o female admitted w/ primary issue seems to be refractory cough, chronic pain and anxiety .  Difficult to tell if there is any true bronchospasm at all. The primary contributing factors here are a) persistent nasal gtt b) worsening and poorly controlled reflux and c) some degree of anxiety/chronic pain. 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  High risk as multiple hospital admission with 3 previous intubation.  Will treat possible sinusitis and although not convinced this is asthma will cont systemic steroids and scheduled BDs. Patient is a 38 year old right-handed female who has a significant history of asthma with recent decompensation she tells me however that she's been troubled with back pain and bilateral leg pain since she had surgery in April 2014. [facility] Mayford KnifeWilliams. She notes she had a surgery previously which included a fusion. She was told that she had broken screws in her fusion and this apparently was repaired. In November of 2014 she underwent a plain x-ray of her lumbar spine X. [place] and this reveals the presence of an interbody arthrodesis at L5-S1 with pedicle screw fixation and a broken screw tip in the S1 pedicle on the right. She notes that after the surgery she had bilateral leg pain and she did not have this before the surgery. This has not gotten any better. She states that she has not had a CT or MRI since her surgery. She states that she cannot afford or tolerate the trip to [place] for further followup. She has recently moved  to the [place] area.  Clinical Impression    Pt admitted with . Pt currently with functional limitations due to the deficits listed below (see PT Problem List).  Pt will benefit from skilled PT to increase their independence and safety with mobility to allow discharge to the venue listed below.   Considering the amount of rehospitalizations recently, this therapist believes it is worth considering CIR for an intensive, holistic Rehab intervention to increase her independence and hopefully decr hospitalizations     PT Assessment  Patient needs continued PT services    Follow Up Recommendations  CIR    Does the patient have the potential to tolerate intense rehabilitation      Barriers to Discharge        Equipment Recommendations  Hospital bed    Recommendations for Other Services Rehab consult  Social Work consult -- we must make sure her 5111 and 17year olds have adequate support Chaplain consult   Frequency Min 4X/week    Precautions / Restrictions Precautions Precautions: Fall;Other (comment);Back (Pt w/ Droplet precautions as indicated on her door, no orders in pt chart however) Precaution Comments: Back precautions for comfort Restrictions Weight Bearing Restrictions: No   Pertinent Vitals/Pain 10+/10 back pain, significantly limiting any and all mobility patient repositioned for comfort       Mobility  Bed Mobility Overal bed mobility: Needs Assistance Bed Mobility: Sidelying to Sit;Supine to Sit;Sit to Supine;Sit to Sidelying;Rolling Rolling: Min assist;+2 for safety/equipment;+2 for physical assistance Sidelying to sit: Min assist;+2 for safety/equipment;+2 for physical assistance Sit to sidelying: Min assist;+2  for physical assistance;+2 for safety/equipment General bed mobility comments: VC's for safety, hand placement and back precautions for comfort/log roll etc. Min assist to guide UB and LB during bed mobility secondary to pt 10/10 back  pain. Transfers Overall transfer level: Needs assistance Equipment used: Rolling walker (2 wheeled) Transfers: Sit to/from Stand Sit to Stand: +2 physical assistance;+2 safety/equipment;Min assist;Mod assist General transfer comment: Pt w/ difficulty bearing weight and advancing LE's w/ side step while repositioning in bed using RW. Pt required 2 person assist (one at LE's and one UB w/ RW). Pt crying throughout session secondary to pain. Ambulation/Gait Ambulation/Gait assistance: +2 physical assistance;Mod assist Ambulation Distance (Feet): 2 Feet (sidesteps toward HOB) Assistive device: Rolling walker (2 wheeled) Gait Pattern/deviations: Antalgic Gait velocity: Extremely slow General Gait Details: Extremely slow; Pt with weakness bil LEs, manifesting during functional mobility as decr stance stability R and L and with ataxic stepping; Noted one major loss of balance resulting in uncontrolled sit back to bed    Exercises     PT Diagnosis: Difficulty walking;Acute pain;Generalized weakness  PT Problem List: Decreased strength;Decreased range of motion;Decreased activity tolerance;Decreased balance;Decreased mobility;Decreased coordination;Decreased knowledge of use of DME;Pain PT Treatment Interventions: DME instruction;Gait training;Stair training;Functional mobility training;Therapeutic activities;Therapeutic exercise;Balance training;Patient/family education     PT Goals(Current goals can be found in the care plan section) Acute Rehab PT Goals Patient Stated Goal: Decreased back pain PT Goal Formulation: With patient Time For Goal Achievement: 09/24/13 Potential to Achieve Goals: Fair  Visit Information  Last PT Received On: 09/10/13 Assistance Needed: +2 PT/OT/SLP Co-Evaluation/Treatment: Yes Reason for Co-Treatment: Other (comment);For patient/therapist safety (For pt's decr activity tolerance) PT goals addressed during session: Mobility/safety with mobility;Balance;Proper  use of DME History of Present Illness: Pt is a 37y/o female admitted w/ primary issue seems to be refractory cough, chronic pain and anxiety .  Difficult to tell if there is any true bronchospasm at all. The primary contributing factors here are a) persistent nasal gtt b) worsening and poorly controlled reflux and c) some degree of anxiety/chronic pain. 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  High risk as multiple hospital admission with 3 previous intubation.  Will treat possible sinusitis and although not convinced this is asthma will cont systemic steroids and scheduled BDs. Patient is a 38 year old right-handed female who has a significant history of asthma with recent decompensation she tells me however that she's been troubled with back pain and bilateral leg pain since she had surgery in April 2014. This was performed in [facility] by Dr. Mayford Knife. She notes she had a surgery previously which included a fusion. She was told that she had broken screws in her fusion and this apparently was repaired. In November of 2014 she underwent a plain x-ray of her lumbar spine X. [place] and this reveals the presence of an interbody arthrodesis at L5-S1 with pedicle screw fixation and a broken screw tip in the S1 pedicle on the right. She notes that after the surgery she had bilateral leg pain and she did not have this before the surgery. This has not gotten any better. She states that she has not had a CT or MRI since her surgery. She states that she cannot afford or tolerate the trip to [place] for further followup. She has recently moved to the [place] area.  Prior Functioning  Home Living Family/patient expects to be discharged to:: Private residence Living Arrangements: Children;Other (Comment) (Children ages 43 & 26  y/o) Available Help at Discharge: Family;Friend(s);Personal care attendant Type of Home: House Home Access: Stairs to enter [employer] of Steps: 3 Entrance Stairs-Rails: None Home Layout: One level Home Equipment: Walker - standard;Shower seat;Bedside commode Additional Comments: REports she sleeps on an air mattress Prior Function Level of Independence: Needs assistance Gait / Transfers Assistance Needed: Assistance with stairs; Amb with walker ADL's / Homemaking Assistance Needed: Pt states that he children assist with ADL's and homemaking. "My kids dress me" Comments: Pt tearful throughout session, asking to please not talk about her children when asked whom is taking care of them while pt is in hospital. Pt stated "They're taking care of themselves, I don't want to talk about it." Crying. Pt agreeable to Chaplain visit. Communication Communication: No difficulties Dominant Hand: Right    Cognition  Cognition Arousal/Alertness: Awake/alert Behavior During Therapy: Anxious (Pt tearful, crying throughout assessment. Asking not to discuss her children) Overall Cognitive Status: Within Functional Limits for tasks assessed    Extremity/Trunk Assessment Upper Extremity Assessment Upper Extremity Assessment: Overall WFL for tasks assessed Lower Extremity Assessment Lower Extremity Assessment: Generalized weakness   Balance Balance Overall balance assessment: Needs assistance Sitting-balance support: Bilateral upper extremity supported;Feet supported Sitting balance-Leahy Scale: Good Standing balance support: Bilateral upper extremity supported Standing balance-Leahy Scale: Fair Standing balance comment: Pt required 2 person assist w/ RW and physical assist at LE's and UE's with side steps to [facility] w/ RW.  End of Session PT - End of Session Activity Tolerance: Patient limited by pain Patient left: in bed;with call bell/phone within reach Nurse Communication: Mobility status   GP     Van Clines [facility] [place], [place] [phone]  09/10/2013, 12:34 PM

## 2013-09-10 NOTE — Progress Notes (Signed)
Rehab Admissions Coordinator Note:  Patient was screened by Clois DupesBoyette, Kanai Berrios Godwin for appropriateness for an Inpatient Acute Rehab Consult.  At this time, we are recommending Inpatient Rehab consult once medical workup completed with further medical plan outlined if continues to need therapy.  Clois DupesBoyette, Mikaylah Libbey Godwin 09/10/2013, 12:50 PM  I can be reached at [phone].

## 2013-09-11 ENCOUNTER — Encounter (HOSPITAL_COMMUNITY): Payer: Self-pay | Admitting: Orthopedic Surgery

## 2013-09-11 LAB — GLUCOSE, CAPILLARY: GLUCOSE-CAPILLARY: 128 mg/dL — AB (ref 70–99)

## 2013-09-11 LAB — CBC
HEMATOCRIT: 31.9 % — AB (ref 36.0–46.0)
HEMOGLOBIN: 10.3 g/dL — AB (ref 12.0–15.0)
MCH: 24.9 pg — ABNORMAL LOW (ref 26.0–34.0)
MCHC: 32.3 g/dL (ref 30.0–36.0)
MCV: 77.2 fL — ABNORMAL LOW (ref 78.0–100.0)
Platelets: 303 10*3/uL (ref 150–400)
RBC: 4.13 MIL/uL (ref 3.87–5.11)
RDW: 17.8 % — ABNORMAL HIGH (ref 11.5–15.5)
WBC: 21.3 10*3/uL — ABNORMAL HIGH (ref 4.0–10.5)

## 2013-09-11 MED ORDER — PANTOPRAZOLE SODIUM 40 MG PO TBEC: 40.0000 mg | DELAYED_RELEASE_TABLET | Freq: Every day | ORAL | Status: DC

## 2013-09-11 MED ORDER — GABAPENTIN 300 MG PO CAPS: 600.0000 mg | ORAL_CAPSULE | Freq: Three times a day (TID) | ORAL | Status: DC

## 2013-09-11 NOTE — Progress Notes (Signed)
Physical medicine and rehabilitation consult requested. Physical therapy evaluation completed 09/10/2013 patient progressing nicely with followup therapy today and recommendations of home health therapies. We'll, rehabilitation consult at this time with recommendations of home health therapies

## 2013-09-11 NOTE — Discharge Instructions (Signed)
Upper Respiratory Infection, Adult °An upper respiratory infection (URI) is also sometimes known as the common cold. The upper respiratory tract includes the nose, sinuses, throat, trachea, and bronchi. Bronchi are the airways leading to the lungs. Most people improve within 1 week, but symptoms can last up to 2 weeks. A residual cough may last even longer.  °CAUSES °Many different viruses can infect the tissues lining the upper respiratory tract. The tissues become irritated and inflamed and often become very moist. Mucus production is also common. A cold is contagious. You can easily spread the virus to others by oral contact. This includes kissing, sharing a glass, coughing, or sneezing. Touching your mouth or nose and then touching a surface, which is then touched by another person, can also spread the virus. °SYMPTOMS  °Symptoms typically develop 1 to 3 days after you come in contact with a cold virus. Symptoms vary from person to person. They may include: °· Runny nose. °· Sneezing. °· Nasal congestion. °· Sinus irritation. °· Sore throat. °· Loss of voice (laryngitis). °· Cough. °· Fatigue. °· Muscle aches. °· Loss of appetite. °· Headache. °· Low-grade fever. °DIAGNOSIS  °You might diagnose your own cold based on familiar symptoms, since most people get a cold 2 to 3 times a year. Your caregiver can confirm this based on your exam. Most importantly, your caregiver can check that your symptoms are not due to another disease such as strep throat, sinusitis, pneumonia, asthma, or epiglottitis. Blood tests, throat tests, and X-rays are not necessary to diagnose a common cold, but they may sometimes be helpful in excluding other more serious diseases. Your caregiver will decide if any further tests are required. °RISKS AND COMPLICATIONS  °You may be at risk for a more severe case of the common cold if you smoke cigarettes, have chronic heart disease (such as heart failure) or lung disease (such as asthma), or if  you have a weakened immune system. The very young and very old are also at risk for more serious infections. Bacterial sinusitis, middle ear infections, and bacterial pneumonia can complicate the common cold. The common cold can worsen asthma and chronic obstructive pulmonary disease (COPD). Sometimes, these complications can require emergency medical care and may be life-threatening. °PREVENTION  °The best way to protect against getting a cold is to practice good hygiene. Avoid oral or hand contact with people with cold symptoms. Wash your hands often if contact occurs. There is no clear evidence that vitamin C, vitamin E, echinacea, or exercise reduces the chance of developing a cold. However, it is always recommended to get plenty of rest and practice good nutrition. °TREATMENT  °Treatment is directed at relieving symptoms. There is no cure. Antibiotics are not effective, because the infection is caused by a virus, not by bacteria. Treatment may include: °· Increased fluid intake. Sports drinks offer valuable electrolytes, sugars, and fluids. °· Breathing heated mist or steam (vaporizer or shower). °· Eating chicken soup or other clear broths, and maintaining good nutrition. °· Getting plenty of rest. °· Using gargles or lozenges for comfort. °· Controlling fevers with ibuprofen or acetaminophen as directed by your caregiver. °· Increasing usage of your inhaler if you have asthma. °Zinc gel and zinc lozenges, taken in the first 24 hours of the common cold, can shorten the duration and lessen the severity of symptoms. Pain medicines may help with fever, muscle aches, and throat pain. A variety of non-prescription medicines are available to treat congestion and runny nose. Your caregiver   can make recommendations and may suggest nasal or lung inhalers for other symptoms.  HOME CARE INSTRUCTIONS   Only take over-the-counter or prescription medicines for pain, discomfort, or fever as directed by your  caregiver.  Use a warm mist humidifier or inhale steam from a shower to increase air moisture. This may keep secretions moist and make it easier to breathe.  Drink enough water and fluids to keep your urine clear or pale yellow.  Rest as needed.  Return to work when your temperature has returned to normal or as your caregiver advises. You may need to stay home longer to avoid infecting others. You can also use a face mask and careful hand washing to prevent spread of the virus. SEEK MEDICAL CARE IF:   After the first few days, you feel you are getting worse rather than better.  You need your caregiver's advice about medicines to control symptoms.  You develop chills, worsening shortness of breath, or brown or red sputum. These may be signs of pneumonia.  You develop yellow or brown nasal discharge or pain in the face, especially when you bend forward. These may be signs of sinusitis.  You develop a fever, swollen neck glands, pain with swallowing, or white areas in the back of your throat. These may be signs of strep throat. SEEK IMMEDIATE MEDICAL CARE IF:   You have a fever.  You develop severe or persistent headache, ear pain, sinus pain, or chest pain.  You develop wheezing, a prolonged cough, cough up blood, or have a change in your usual mucus (if you have chronic lung disease).  You develop sore muscles or a stiff neck. Document Released: 01/25/2001 Document Revised: 10/24/2011 Document Reviewed: 12/03/2010 [facility] Patient Information 2014 [employer] [place].  Home Health nurse to be provided by [facility] [phone]

## 2013-09-11 NOTE — Progress Notes (Signed)
Clinical Social Work Department BRIEF PSYCHOSOCIAL ASSESSMENT 09/11/2013  Patient:  Julie Kerr,Julie Kerr     Account Number:  [id number]     Admit date:  09/06/2013  Clinical Social Worker:  Julie Kerr,Julie Kerr, [name]  Date/Time:  09/11/2013 04:00 PM  Referred by:  Physician  Date Referred:  09/11/2013 Referred for  SNF Placement   Other Referral:   Interview type:  Patient Other interview type:    PSYCHOSOCIAL DATA Living Status:  WITH MINOR CHILDREN Admitted from facility:   Level of care:   Primary support name:  Julie Kerr [phone] Primary support relationship to patient:  CHILD, ADULT Degree of support available:   Pt has limited support system but reports that the supports she does have are strong    CURRENT CONCERNS Current Concerns  Post-Acute Placement   Other Concerns:    SOCIAL WORK ASSESSMENT / PLAN CSW informed that there are some concerns about pt children who are minors. CSW spoke with pt and confirmed pt does have two children ages 6511 and 8217 who live with her. CSW inquired as to where pt's children were while pt was in the hospital. Pt stated they are in her home along with her step son. CSW asked how old pt step son was, and pt informed CSW he is 2223. CSW explained that there was some concern that pt's children were home alone and CSW would need to speak with pt step son to just confirm that pt's children were not alone and in good care. [facility] CSW spoke to pt step son and confirmed that he is taking care of pt children while she is in the hospital. Julie Kerr informed CSW that he is with children at all times because he works while they are in school. If Julie Kerr does need assistance, he relies on a neighbor who helps watch the children if he needs to run any errands. At this time, there are no concerning reasons to call and make report as pt step son was able to answer all questions. CSW will sign off.   Assessment/plan status:  No  Further Intervention Required Other assessment/ plan:   Information/referral to community resources:   None needed at this time    PATIENT'S/FAMILY'S RESPONSE TO PLAN OF CARE: Pt would like to return home as soon as possible       Julie Kerr, [name] [phone]

## 2013-09-11 NOTE — Progress Notes (Deleted)
[facility]                                                                              Patient Demographics  Julie Kerr, is a 38 y.o. female, DOB - 02-Oct-1975, NFA:[id number]  Admit date - 09/06/2013   Admitting Physician Brand Males, MD  Outpatient Primary MD for the patient is Oralia Rud, MD  LOS - 5   Chief Complaint  Patient presents with  . 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 -Will need ENT evaluation as oupatient  Chest Pain -Anxiety related-no further work-up  Poorly controlled reflux -QHS pepcid 20 mg, daily protonix 40 mg -Consider D/c reglan in am.  Steroid induced Hyperglycemia -HbA1c 5.8-as all sugars below 200.   Chronic pain-psychosocial stressors -Clinical biochemist, Social work input -Await further input for potential needs for surgery from Dr. Ellene Route and MRI pending -PT/OT recommend CIR -Will consult -Continue MS contin 15 q 12, OXy IR 10 q3 prn, Gabapentin increased 1/27 to 600 tid  Code Status: Full  Family Communication: None at bedside  Disposition Plan: Admitted  Time Spent in minutes   35 minutes  Procedures  LE doppler, negative   Consults   Neurosurgery  DVT Prophylaxis  Heparin  Lab Results  Component Value Date   PLT 303 09/11/2013    Medications  Scheduled Meds: . arformoterol  15 mcg Nebulization Q12H  . benzonatate  200 mg Oral TID  . budesonide (PULMICORT) nebulizer solution  0.25 mg Nebulization Q12H  . famotidine  20 mg Oral Daily  . feeding supplement (ENSURE COMPLETE)  237 mL Oral BID BM  . fluticasone  2 spray Each Nare BID  . gabapentin  600 mg Oral TID  . heparin  5,000 Units Subcutaneous Q8H  . metoCLOPramide (REGLAN) injection  5 mg Intravenous Q12H  . montelukast  10 mg Oral Daily  . morphine  15 mg  Oral Q12H  . pantoprazole  40 mg Oral Daily  . predniSONE  50 mg Oral Q breakfast  . sodium chloride  2 spray Each Nare QID   Continuous Infusions:  PRN Meds:.albuterol, ALPRAZolam, oxyCODONE, promethazine-codeine  Antibiotics    Anti-infectives   Start     Dose/Rate Route Frequency Ordered Stop   09/09/13 2100  piperacillin-tazobactam (ZOSYN) IVPB 3.375 g  Status:  Discontinued     3.375 g 12.5 mL/hr over 240 Minutes Intravenous Every 8 hours 09/09/13 2008 09/10/13 1613   09/07/13 0200  piperacillin-tazobactam (ZOSYN) IVPB 3.375 g  Status:  Discontinued     3.375 g 12.5 mL/hr over 240 Minutes Intravenous Every 8 hours 09/07/13 0142 09/08/13 0907   09/06/13 2200  oseltamivir (TAMIFLU) capsule 75 mg  Status:  Discontinued     75 mg Oral 2 times daily 09/06/13 1556 09/08/13 0735   09/06/13 1700  piperacillin-tazobactam (ZOSYN) IVPB 3.375 g     3.375 g 100 mL/hr over 30 Minutes Intravenous  Once 09/06/13 1649 09/06/13 1805   09/06/13 1600  levofloxacin (LEVAQUIN) IVPB 750  mg  Status:  Discontinued     750 mg 100 mL/hr over 90 Minutes Intravenous Every 24 hours 09/06/13 1556 09/06/13 1629        Subjective:   Julie Kerr seen and examined today.  Patient complains of burning pain in her lower back and both legs.  She states he anxiety is getting worse.  She is worried about her children.  She knows that she was recently restarted on xanax but does not think it is working.  Objective:   Filed Vitals:   09/10/13 2056 09/11/13 0620 09/11/13 0625 09/11/13 0724  BP: 126/76  131/71   Pulse: 97  90   Temp: 98.9 F (37.2 C)  98.5 F (36.9 C)   TempSrc:   Oral   Resp: 18  20   Height:      Weight:  88.089 kg (194 lb 3.2 oz)    SpO2: 95%  99% 98%    Wt Readings from Last 3 Encounters:  09/11/13 88.089 kg (194 lb 3.2 oz)  09/03/13 81 kg (178 lb 9.2 oz)  08/10/13 81 kg (178 lb 9.2 oz)     Intake/Output Summary (Last 24 hours) at 09/11/13 0756 Last data filed at 09/11/13  1224  Gross per 24 hour  Intake   1600 ml  Output      0 ml  Net   1600 ml    Exam  General: Well developed, well nourished, mild distress, appears stated age  HEENT: NCAT, PERRLA, EOMI, Anicteic Sclera, mucous membranes moist. No pharyngeal erythema or exudates  Neck: Supple, no JVD, no masses  Cardiovascular: S1 S2 auscultated, no rubs, murmurs or gallops. Regular rate and rhythm.  Respiratory: Clear to auscultation bilaterally with equal chest rise  Abdomen: Soft, nontender, nondistended, + bowel sounds  Extremities: warm dry without cyanosis clubbing or edema  Neuro: AAOx3, cranial nerves grossly intact.  Skin: Without rashes exudates or nodules  Psych: Depressed affect and demeanor with intact judgement and insight, anxious    Data Review   Micro Results Recent Results (from the past 240 hour(s))  MRSA PCR SCREENING     Status: None   Collection Time    09/03/13 12:05 AM      Result Value Range Status   MRSA by PCR NEGATIVE  NEGATIVE Final   Comment:            The GeneXpert MRSA Assay (FDA     approved for NASAL specimens     only), is one component of a     comprehensive MRSA colonization     surveillance program. It is not     intended to diagnose MRSA     infection nor to guide or     monitor treatment for     MRSA infections.  CULTURE, BLOOD (ROUTINE X 2)     Status: None   Collection Time    09/06/13  5:05 PM      Result Value Range Status   Specimen Description BLOOD ARM LEFT   Final   Special Requests BOTTLES DRAWN AEROBIC AND ANAEROBIC 10CC   Final   Culture  Setup Time     Final   Value: 09/06/2013 22:18     Performed at [facility]   Culture     Final   Value:        BLOOD CULTURE RECEIVED NO GROWTH TO DATE CULTURE WILL BE HELD FOR 5 DAYS BEFORE ISSUING A FINAL NEGATIVE REPORT  Performed at [facility]   Report Status PENDING   Incomplete  CULTURE, BLOOD (ROUTINE X 2)     Status: None   Collection Time    09/06/13   5:15 PM      Result Value Range Status   Specimen Description BLOOD HAND LEFT   Final   Special Requests BOTTLES DRAWN AEROBIC AND ANAEROBIC 10CC   Final   Culture  Setup Time     Final   Value: 09/06/2013 22:17     Performed at [facility]   Culture     Final   Value:        BLOOD CULTURE RECEIVED NO GROWTH TO DATE CULTURE WILL BE HELD FOR 5 DAYS BEFORE ISSUING A FINAL NEGATIVE REPORT     Performed at [facility]   Report Status PENDING   Incomplete  MRSA PCR SCREENING     Status: None   Collection Time    09/06/13  6:38 PM      Result Value Range Status   MRSA by PCR NEGATIVE  NEGATIVE Final   Comment:            The GeneXpert MRSA Assay (FDA     approved for NASAL specimens     only), is one component of a     comprehensive MRSA colonization     surveillance program. It is not     intended to diagnose MRSA     infection nor to guide or     monitor treatment for     MRSA infections.  RESPIRATORY VIRUS PANEL     Status: Abnormal   Collection Time    09/06/13  6:46 PM      Result Value Range Status   Source - RVPAN NASAL SWAB   Corrected   Comment: CORRECTED ON 01/25 AT 1194: PREVIOUSLY REPORTED AS NASAL SWAB   Respiratory Syncytial Virus A NOT DETECTED   Final   Respiratory Syncytial Virus B NOT DETECTED   Final   Influenza A NOT DETECTED   Final   Influenza B NOT DETECTED   Final   Parainfluenza 1 NOT DETECTED   Final   Parainfluenza 2 NOT DETECTED   Final   Parainfluenza 3 NOT DETECTED   Final   Metapneumovirus NOT DETECTED   Final   Rhinovirus DETECTED (*)  Final   Adenovirus NOT DETECTED   Final   Influenza A H1 NOT DETECTED   Final   Influenza A H3 NOT DETECTED   Final   Comment: (NOTE)           Normal Reference Range for each Analyte: NOT DETECTED     Testing performed using the Luminex xTAG Respiratory Viral Panel test     kit.     This test was developed and its performance characteristics determined     by [facility]. It has not  been cleared or approved by the [place]     Food and Drug Administration. This test is used for clinical purposes.     It should not be regarded as investigational or for research. This     laboratory is certified under the [place] (CLIA) as qualified to perform high complexity     clinical laboratory testing.     Performed at [facility]    Radiology Reports Ct Lumbar Spine Wo Contrast  09/09/2013   CLINICAL DATA:  Back pain and bilateral  leg pain.  Fractured screws.  EXAM: CT LUMBAR SPINE WITHOUT CONTRAST  TECHNIQUE: Multidetector CT imaging of the lumbar spine was performed without intravenous contrast administration. Multiplanar CT image reconstructions were also generated.  COMPARISON:  Lumbar spine radiographs 06/18/2013  FINDINGS: The lumbar spine is imaged from the midbody of T12 through S2-3. The patient is status post PLIF at L5-S1. There is lucency about the screws at both levels. A previously fractured screw remains at S1 on the right.  Vertebral body heights and alignment are normal. Limited imaging of the abdomen is unremarkable.  The disc levels at L3-4 and above are normal.  L4-5: A broad-based disc herniation is present. Mild lateral recess and foraminal stenosis is present bilaterally.  L5-S1: The disc spacer extends posterior to the vertebral body of L5 by 3 mm in the midline. This potentially affects the left lateral recess. The foramina are patent bilaterally. The posterior canal is widely patent.  IMPRESSION: 1. 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 2. Posterior displacement of the disc spacer as described. This appears stable. 3. Lucency around the posterior aspect of the L5 and S1 pedicles suggests some degree of movement. 4. Previously fractured right S1 screw. 5. Broad-based disc herniation at L4-5 with mild lateral recess and foraminal stenosis.   Electronically Signed   By: Lawrence Santiago M.D.   On: 09/09/2013  16:20   Dg Chest Portable 1 View  09/06/2013   CLINICAL DATA:  Shortness of Breath  EXAM: PORTABLE CHEST - 1 VIEW  COMPARISON:  September 02, 2013  FINDINGS: Lungs are clear. The heart size and pulmonary vascularity are normal. No adenopathy. No pneumothorax. No bone lesions.  IMPRESSION: No abnormality noted.   Electronically Signed   By: Lowella Grip M.D.   On: 09/06/2013 15:07   Dg Chest Port 1 View  09/02/2013   CLINICAL DATA:  Shortness of breath.  EXAM: PORTABLE CHEST - 1 VIEW  COMPARISON:  DG CHEST 2 VIEW dated 08/09/2013  FINDINGS: The heart size and mediastinal contours are within normal limits. Both lungs are clear. The visualized skeletal structures are unremarkable.  IMPRESSION: No active disease.   Electronically Signed   By: Rolm Baptise M.D.   On: 09/02/2013 19:05    CBC  Recent Labs Lab 09/06/13 1418 09/06/13 1704 09/07/13 0305 09/08/13 0245 09/09/13 0328 09/11/13 0715  WBC 9.8 16.5* 15.4* 23.2* 19.5* 21.3*  HGB 12.5 10.6* 10.2* 10.7* 10.4* 10.3*  HCT 37.3 32.0* 31.3* 32.6* 33.2* 31.9*  PLT 422* 351 328 336 345 303  MCV 76.0* 75.5* 75.2* 76.3* 77.2* 77.2*  MCH 25.5* 25.0* 24.5* 25.1* 24.2* 24.9*  MCHC 33.5 33.1 32.6 32.8 31.3 32.3  RDW 17.7* 17.4* 17.6* 17.8* 18.0* 17.8*  LYMPHSABS 3.3  --   --   --   --   --   MONOABS 0.6  --   --   --   --   --   EOSABS 0.0  --   --   --   --   --   BASOSABS 0.0  --   --   --   --   --     Chemistries   Recent Labs Lab 09/06/13 1418 09/06/13 1704 09/07/13 0305 09/08/13 0245 09/09/13 0328  NA 140  --  138 138 140  K 4.4  --  4.8 4.6 4.2  CL 103  --  104 102 102  CO2 22  --  '19 22 26  ' GLUCOSE 121*  --  227* 137* 141*  BUN 14  --  '10 9 11  ' CREATININE 0.68 0.68 0.63 0.64 0.75  CALCIUM 9.6  --  9.1 9.5 9.0   ------------------------------------------------------------------------------------------------------------------ estimated creatinine clearance is 100.9 ml/min (by C-G formula based on Cr of  0.75). ------------------------------------------------------------------------------------------------------------------  Recent Labs  09/08/13 0944 09/09/13 0328  HGBA1C 5.8* 6.0*   ------------------------------------------------------------------------------------------------------------------ No results found for this basename: CHOL, HDL, LDLCALC, TRIG, CHOLHDL, LDLDIRECT,  in the last 72 hours ------------------------------------------------------------------------------------------------------------------ No results found for this basename: TSH, T4TOTAL, FREET3, T3FREE, THYROIDAB,  in the last 72 hours ------------------------------------------------------------------------------------------------------------------ No results found for this basename: VITAMINB12, FOLATE, FERRITIN, TIBC, IRON, RETICCTPCT,  in the last 72 hours  Coagulation profile No results found for this basename: INR, PROTIME,  in the last 168 hours  No results found for this basename: DDIMER,  in the last 72 hours  Cardiac Enzymes  Recent Labs Lab 09/06/13 1703 09/06/13 2241 09/07/13 0305  TROPONINI <0.30 <0.30 <0.30   ------------------------------------------------------------------------------------------------------------------ No components found with this basename: POCBNP,     Zackory Pudlo D.O. on 09/11/2013 at 7:56 AM  Between 7am to 7pm - Pager - [phone]  After 7pm go to [email or web address] - password [email or web address]  And look for the night coverage person covering for me after hours  [facility] Office  [phone]

## 2013-09-11 NOTE — Progress Notes (Signed)
Chaplain returned for a follow-up visit with Pt at Pt request.  Pt was alert and seated in the bed and weeping.  Pt is concerned with multiple family stress issues and financial constraints.  Primarily on her mind is the ability to pay for prescriptions once she leaves the hospital.  Pt conversed openly with chaplain for 60+ minutes.  Chaplain offered empathetic listening, prayer, scriptures and presence. Chaplain also offered a journal which the patient accepted to write her feeling and dreams.  Chaplain challenged patient to think about "what is good" or for" what she is thankful" as a starting point for her journal.  Pt seemed interested and excited about the journal and stated that she enjoyed writing.  Pt also got out of bed and opened the blinds, stating, "I don't want to sit in a gloomy room anymore."  Chaplain is available for further consult, if desired.    09/11/13 1100  Clinical Encounter Type  Visited With Patient;Health care provider  Visit Type Follow-up;Psychological support;Spiritual support  Referral From Patient  Spiritual Encounters  Spiritual Needs Sacred text;Prayer;Emotional  Stress Factors  Patient Stress Factors Exhausted;Family relationships;Financial concerns;Health changes;Lack of caregivers  Family Stress Factors None identified    [place] Jen[place]ynn Kerr, [address]haplain

## 2013-09-11 NOTE — Progress Notes (Signed)
Called into room at 0600 pt stated she was having a panic attack. Pt tearful, shaking stated she woke up in pain and forgot for a second where she was at. Oriented x 4, oxycodone and xanax given. Deep breathing instructed to pt, spend around 30 minutes in room with pt holding her hand and using calming and distraction techniques. Pt calmed down, instructed her to attempt to contact her daughter or son which she states has helped in the past. Pt stated she felt slightly better once medications began to work. VSS. Will continue to monitor.

## 2013-09-11 NOTE — Discharge Summary (Signed)
Physician Discharge Summary  Julie Kerr QBH:[id number] DOB: Oct 15, 1975 DOA: 09/06/2013  PCP: Oralia Rud, MD  Admit date: 09/06/2013 Discharge date: 09/11/2013  Time spent: 35 minutes  Recommendations for Outpatient Follow-up:  Patient will be discharged to home with home health. She should follow with her primary care physician within one week of discharge. Patient to continue taking her medications as prescribed. Patient should followup with an ENT physician regarding her vocal cord dysfunction.  Discharge Diagnoses:  Active Problems:   Dyspnea   Vocal cord dysfunction   Discharge Condition: Stable  Diet recommendation: Heart healthy  Filed Weights   09/09/13 0400 09/10/13 0500 09/11/13 0620  Weight: 85.5 kg (188 lb 7.9 oz) 87.1 kg (192 lb 0.3 oz) 88.089 kg (194 lb 3.2 oz)    History of present illness:  This is a 38 year old former smoker (stopped 8 mo ago), presents to the ER on 1/23 in acute distress. Had recently been discharged from [facility] on 1/19 for what was felt to be URI and asthmatic exacerbation. Sent home on pred taper, levaquin and instructions to resume home BDs. She was discharged to home and since her d/c she has had persistent cough, nasal discharge, sore throat, and worsening reflux. The cough symptoms have worsened to the point she coughs with every exhalation. She presented to the ER in acute distress. She was initially treated with NIPPV, SABA, IV steroids, and some fentanyl for pain. PCCM was asked to see for respiratory distress.    Hospital Course:  Acute asthmatic/COPD flare in setting of vocal cord dysfunction  -Currently stable and improving  -Continue Brovana/budesonide w/ PRN SABA., Prednisone 50 mg daily for 5 days then stop.  -Will need ENT evaluation as oupatient   Chest Pain  -Anxiety related-no further work-up   Poorly controlled reflux  -QHS pepcid 20 mg, daily protonix 40 mg   Steroid induced Hyperglycemia  -HbA1c 5.8-as all sugars  below 200.   Chronic pain-psychosocial stressors  -Chaplain, Social work input  -Await further input for potential needs for surgery from Dr. Ellene Route and MRI pending  -PT/OT recommend CIR, however patient wishes to go home. -Continue MS contin 15 q 12, OXy IR 10 q3 prn, Gabapentin increased 1/27 to 600 tid  Procedures: LE doppler, negative   Consultations: Neurosurgery Chaplain  Discharge Exam: Filed Vitals:   09/11/13 1434  BP: 132/68  Pulse: 95  Temp: 99 F (37.2 C)  Resp: 20   Exam  General: Well developed, well nourished, mild distress, appears stated age  HEENT: NCAT, PERRLA, EOMI, Anicteic Sclera, mucous membranes moist. No pharyngeal erythema or exudates  Neck: Supple, no JVD, no masses  Cardiovascular: S1 S2 auscultated, no rubs, murmurs or gallops. Regular rate and rhythm.  Respiratory: Clear to auscultation bilaterally with equal chest rise  Abdomen: Soft, nontender, nondistended, + bowel sounds  Extremities: warm dry without cyanosis clubbing or edema  Neuro: AAOx3, cranial nerves grossly intact.  Skin: Without rashes exudates or nodules  Psych: Depressed affect and demeanor with intact judgement and insight, anxious  Discharge Instructions      Discharge Orders   Future Orders Complete By Expires   Diet - low sodium heart healthy  As directed    Discharge instructions  As directed    Comments:     Patient will be discharged to home with home health. She should follow with her primary care physician within one week of discharge. Patient to continue taking her medications as prescribed. Patient should followup with an ENT  physician regarding her vocal cord dysfunction.   Increase activity slowly  As directed        Medication List    STOP taking these medications       levofloxacin 750 MG tablet  Commonly known as:  LEVAQUIN     THERAFLU COLD & COUGH PO      TAKE these medications       albuterol 108 (90 BASE) MCG/ACT inhaler  Commonly known as:   PROVENTIL HFA;VENTOLIN HFA  Inhale 2 puffs into the lungs every 4 (four) hours as needed for wheezing.     albuterol (5 MG/ML) 0.5% nebulizer solution  Commonly known as:  PROVENTIL  Take 0.5 mLs (2.5 mg total) by nebulization every 6 (six) hours as needed for wheezing or shortness of breath.     ALPRAZolam 1 MG tablet  Commonly known as:  XANAX  Take 1 mg by mouth 3 (three) times daily.     benzonatate 200 MG capsule  Commonly known as:  TESSALON  Take 1 capsule (200 mg total) by mouth 3 (three) times daily as needed for cough.     cyclobenzaprine 10 MG tablet  Commonly known as:  FLEXERIL  Take 10 mg by mouth 3 (three) times daily as needed for muscle spasms.     feeding supplement (ENSURE COMPLETE) Liqd  Take 237 mLs by mouth 2 (two) times daily between meals.     Fluticasone-Salmeterol 250-50 MCG/DOSE Aepb  Commonly known as:  ADVAIR  Inhale 1 puff into the lungs 2 (two) times daily.     gabapentin 300 MG capsule  Commonly known as:  NEURONTIN  Take 2 capsules (600 mg total) by mouth 3 (three) times daily.     guaiFENesin 600 MG 12 hr tablet  Commonly known as:  MUCINEX  Take 1 tablet (600 mg total) by mouth 2 (two) times daily.     morphine 15 MG 12 hr tablet  Commonly known as:  MS CONTIN  Take 15 mg by mouth every 12 (twelve) hours.     oxyCODONE 15 MG immediate release tablet  Commonly known as:  ROXICODONE  Take 15 mg by mouth every 4 (four) hours as needed for pain.     pantoprazole 40 MG tablet  Commonly known as:  PROTONIX  Take 1 tablet (40 mg total) by mouth daily.     predniSONE 20 MG tablet  Commonly known as:  DELTASONE  Take 2 tablets (40 mg total) by mouth daily.     SINGULAIR 10 MG tablet  Generic drug:  montelukast  Take 10 mg by mouth daily.     zolpidem 10 MG tablet  Commonly known as:  AMBIEN  Take 10 mg by mouth at bedtime.       Allergies  Allergen Reactions  . Robitussin Dm [Dextromethorphan-Guaifenesin] Nausea And Vomiting  .  Nsaids Hives  . Tramadol Hives   Follow-up Information   Follow up with WELLS,WENDELL, MD. Schedule an appointment as soon as possible for a visit in 1 week.   Specialty:  Family Medicine       The results of significant diagnostics from this hospitalization (including imaging, microbiology, ancillary and laboratory) are listed below for reference.    Significant Diagnostic Studies: Ct Lumbar Spine Wo Contrast  09/09/2013   CLINICAL DATA:  Back pain and bilateral leg pain.  Fractured screws.  EXAM: CT LUMBAR SPINE WITHOUT CONTRAST  TECHNIQUE: Multidetector CT imaging of the lumbar spine was performed without intravenous contrast administration.  Multiplanar CT image reconstructions were also generated.  COMPARISON:  Lumbar spine radiographs 06/18/2013  FINDINGS: The lumbar spine is imaged from the midbody of T12 through S2-3. The patient is status post PLIF at L5-S1. There is lucency about the screws at both levels. A previously fractured screw remains at S1 on the right.  Vertebral body heights and alignment are normal. Limited imaging of the abdomen is unremarkable.  The disc levels at L3-4 and above are normal.  L4-5: A broad-based disc herniation is present. Mild lateral recess and foraminal stenosis is present bilaterally.  L5-S1: The disc spacer extends posterior to the vertebral body of L5 by 3 mm in the midline. This potentially affects the left lateral recess. The foramina are patent bilaterally. The posterior canal is widely patent.  IMPRESSION: 1. 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 2. Posterior displacement of the disc spacer as described. This appears stable. 3. Lucency around the posterior aspect of the L5 and S1 pedicles suggests some degree of movement. 4. Previously fractured right S1 screw. 5. Broad-based disc herniation at L4-5 with mild lateral recess and foraminal stenosis.   Electronically Signed   By: Lawrence Santiago M.D.   On: 09/09/2013 16:20   Mr Lumbar Spine W  Wo Contrast  09/11/2013   CLINICAL DATA:  Leg and back pain for 9 months.  Prior L5-S1 fusion.  EXAM: MRI LUMBAR SPINE WITHOUT AND WITH CONTRAST  TECHNIQUE: Multiplanar and multiecho pulse sequences of the lumbar spine were obtained without and with intravenous contrast.  CONTRAST:  53m MULTIHANCE GADOBENATE DIMEGLUMINE 529 MG/ML IV SOLN  COMPARISON:  09/09/2013 CT.  FINDINGS: Numbering used on prior exam preserved. Postoperative changes at L5-S1 compatible with posterior lumbar interbody fusion. Artifact from hardware partially obscures this level. Paraspinal soft tissues appear within normal limits. The alignment is within normal limits. Patient is mildly tilted in the scanner. The spinal cord terminates dorsal to the L2 vertebra. Vertebral body height is preserved. Intervertebral levels from T10-T11 through L3-L4 are normal. The marrow signal is within normal limits.  L4-L5: Disc appears normal. The central canal is adequately patent. Lateral recesses appear patent. Foramina also appear patent.  L5-S1: Laminectomy and posterior decompression. No recurrent stenosis identified.  After gadolinium administration, there is no abnormal enhancement.  IMPRESSION: 1. 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 No recurrent stenosis. 2. L4-L5 is partially obscured by artifact from the hardware however there is no central, lateral recess, or foraminal stenosis present.   Electronically Signed   By: GDereck LigasM.D.   On: 09/11/2013 08:30   Dg Chest Portable 1 View  09/06/2013   CLINICAL DATA:  Shortness of Breath  EXAM: PORTABLE CHEST - 1 VIEW  COMPARISON:  September 02, 2013  FINDINGS: Lungs are clear. The heart size and pulmonary vascularity are normal. No adenopathy. No pneumothorax. No bone lesions.  IMPRESSION: No abnormality noted.   Electronically Signed   By: WLowella GripM.D.   On: 09/06/2013 15:07   Dg Chest Port 1 View  09/02/2013   CLINICAL DATA:  Shortness of breath.  EXAM: PORTABLE CHEST - 1  VIEW  COMPARISON:  DG CHEST 2 VIEW dated 08/09/2013  FINDINGS: The heart size and mediastinal contours are within normal limits. Both lungs are clear. The visualized skeletal structures are unremarkable.  IMPRESSION: No active disease.   Electronically Signed   By: KRolm BaptiseM.D.   On: 09/02/2013 19:05    Microbiology: Recent Results (from the past 240 hour(s))  MRSA PCR SCREENING     Status: None   Collection Time    09/03/13 12:05 AM      Result Value Range Status   MRSA by PCR NEGATIVE  NEGATIVE Final   Comment:            The GeneXpert MRSA Assay (FDA     approved for NASAL specimens     only), is one component of a     comprehensive MRSA colonization     surveillance program. It is not     intended to diagnose MRSA     infection nor to guide or     monitor treatment for     MRSA infections.  CULTURE, BLOOD (ROUTINE X 2)     Status: None   Collection Time    09/06/13  5:05 PM      Result Value Range Status   Specimen Description BLOOD ARM LEFT   Final   Special Requests BOTTLES DRAWN AEROBIC AND ANAEROBIC 10CC   Final   Culture  Setup Time     Final   Value: 09/06/2013 22:18     Performed at [facility]   Culture     Final   Value:        BLOOD CULTURE RECEIVED NO GROWTH TO DATE CULTURE WILL BE HELD FOR 5 DAYS BEFORE ISSUING A FINAL NEGATIVE REPORT     Performed at [facility]   Report Status PENDING   Incomplete  CULTURE, BLOOD (ROUTINE X 2)     Status: None   Collection Time    09/06/13  5:15 PM      Result Value Range Status   Specimen Description BLOOD HAND LEFT   Final   Special Requests BOTTLES DRAWN AEROBIC AND ANAEROBIC 10CC   Final   Culture  Setup Time     Final   Value: 09/06/2013 22:17     Performed at [facility]   Culture     Final   Value:        BLOOD CULTURE RECEIVED NO GROWTH TO DATE CULTURE WILL BE HELD FOR 5 DAYS BEFORE ISSUING A FINAL NEGATIVE REPORT     Performed at [facility]   Report Status PENDING    Incomplete  MRSA PCR SCREENING     Status: None   Collection Time    09/06/13  6:38 PM      Result Value Range Status   MRSA by PCR NEGATIVE  NEGATIVE Final   Comment:            The GeneXpert MRSA Assay (FDA     approved for NASAL specimens     only), is one component of a     comprehensive MRSA colonization     surveillance program. It is not     intended to diagnose MRSA     infection nor to guide or     monitor treatment for     MRSA infections.  RESPIRATORY VIRUS PANEL     Status: Abnormal   Collection Time    09/06/13  6:46 PM      Result Value Range Status   Source - RVPAN NASAL SWAB   Corrected   Comment: CORRECTED ON 01/25 AT 1914: PREVIOUSLY REPORTED AS NASAL SWAB   Respiratory Syncytial Virus A NOT DETECTED   Final   Respiratory Syncytial Virus B NOT DETECTED   Final   Influenza A NOT DETECTED   Final   Influenza  B NOT DETECTED   Final   Parainfluenza 1 NOT DETECTED   Final   Parainfluenza 2 NOT DETECTED   Final   Parainfluenza 3 NOT DETECTED   Final   Metapneumovirus NOT DETECTED   Final   Rhinovirus DETECTED (*)  Final   Adenovirus NOT DETECTED   Final   Influenza A H1 NOT DETECTED   Final   Influenza A H3 NOT DETECTED   Final   Comment: (NOTE)           Normal Reference Range for each Analyte: NOT DETECTED     Testing performed using the Luminex xTAG Respiratory Viral Panel test     kit.     This test was developed and its performance characteristics determined     by [facility]. It has not been cleared or approved by the [place]     Food and Drug Administration. This test is used for clinical purposes.     It should not be regarded as investigational or for research. This     laboratory is certified under the [place] (CLIA) as qualified to perform high complexity     clinical laboratory testing.     Performed at [facility]: Basic Metabolic Panel:  Recent Labs Lab 09/06/13 1418  09/06/13 1704 09/07/13 0305 09/08/13 0245 09/09/13 0328  NA 140  --  138 138 140  K 4.4  --  4.8 4.6 4.2  CL 103  --  104 102 102  CO2 22  --  _0 GLUCOSE 121*  --  227* 137* 141*  BUN 14  --  _1 CREATININE 0.68 0.68 0.63 0.64 0.75  CALCIUM 9.6  --  9.1 9.5 9.0   Liver Function Tests: No results found for this basename: AST, ALT, ALKPHOS, BILITOT, PROT, ALBUMIN,  in the last 168 hours No results found for this basename: LIPASE, AMYLASE,  in the last 168 hours No results found for this basename: AMMONIA,  in the last 168 hours CBC:  Recent Labs Lab 09/06/13 1418 09/06/13 1704 09/07/13 0305 09/08/13 0245 09/09/13 0328 09/11/13 0715  WBC 9.8 16.5* 15.4* 23.2* 19.5* 21.3*  NEUTROABS 5.9  --   --   --   --   --   HGB 12.5 10.6* 10.2* 10.7* 10.4* 10.3*  HCT 37.3 32.0* 31.3* 32.6* 33.2* 31.9*  MCV 76.0* 75.5* 75.2* 76.3* 77.2* 77.2*  PLT 422* 351 328 336 345 303   Cardiac Enzymes:  Recent Labs Lab 09/06/13 1703 09/06/13 2241 09/07/13 0305  TROPONINI <0.30 <0.30 <0.30   BNP: BNP (last 3 results)  Recent Labs  06/14/13 1610 09/02/13 2358  PROBNP 26.1 <5.0   CBG:  Recent Labs Lab 09/10/13 0642 09/10/13 1120 09/10/13 1630 09/10/13 2121 09/11/13 0632  GLUCAP 109* 138* 150* 135* 128*       Signed:  Cristal Ford  [facility] 09/11/2013, 3:47 PM

## 2013-09-11 NOTE — Care Management Note (Signed)
CARE MANAGEMENT NOTE 09/11/2013  Patient:  Julie Kerr,Julie Kerr   Account Number:  [id number]  Date Initiated:  09/10/2013  Documentation initiated by:  [facility]RI[facility],MARY  Subjective/Objective Assessment:   admitted with asthma exacerbation     Action/Plan:   HHRN arranged. CM contacted  Ames DuraMary Hickling,RN liasion with [facility] with referral.   Anticipated DC Date:  09/11/2013   Anticipated DC Plan:  HOME W HOME HEALTH SERVICES      DC Planning Services  CM consult      [facility]   Choice offered to / List presented to:          [facility]  [facility] RN  HH-10 DISEASE MANAGEMENT      HH agency  [facility]   Status of service:  Completed, signed off Medicare Important Message given?   (If response is "NO", the following Medicare IM given date fields will be blank) Date Medicare IM given:   Date Additional Medicare IM given:    Discharge Disposition:  HOME W HOME HEALTH SERVICES

## 2013-09-11 NOTE — Progress Notes (Signed)
Physical Therapy Treatment Patient Details Name: Julie Kerr MRN: [id number] DOB: 09-09-1975 Today's Date: 09/11/2013 Time: 5284-1324 PT Time Calculation (min): 29 min  PT Assessment / Plan / Recommendation  History of Present Illness Pt is a 38y/o female admitted w/ primary issue seems to be refractory cough, chronic pain and anxiety .  Difficult to tell if there is any true bronchospasm at all. The primary contributing factors here are a) persistent nasal gtt b) worsening and poorly controlled reflux and c) some degree of anxiety/chronic pain. 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  High risk as multiple hospital admission with 3 previous intubation.  Will treat possible sinusitis and although not convinced this is asthma will cont systemic steroids and scheduled BDs. Patient is a 38 year old right-handed female who has a significant history of asthma with recent decompensation she tells me however that she's been troubled with back pain and bilateral leg pain since she had surgery in April 2014. This was performed in [facility] by Dr. Mayford Knife. She notes she had a surgery previously which included a fusion. She was told that she had broken screws in her fusion and this apparently was repaired. In November of 2014 she underwent a plain x-ray of her lumbar spine X. [place] and this reveals the presence of an interbody arthrodesis at L5-S1 with pedicle screw fixation and a broken screw tip in the S1 pedicle on the right. She notes that after the surgery she had bilateral leg pain and she did not have this before the surgery. This has not gotten any better. She states that she has not had a CT or MRI since her surgery. She states that she cannot afford or tolerate the trip to [place] for further followup. She has recently moved  to the [place] area.   PT Comments   Very motivated to move, do well, and go home; Overall, moving well, not sure that Medicaid will cover HHPT visits, and pt is still with back pain, which I believe will be best addressed with Outpt PT; Discussed [facility] for transportation and notified SW  Notified Dr. Catha Gosselin via text page   Follow Up Recommendations  Home health PT (Outpt if Insurance will not pay fo rHHPT)     Does the patient have the potential to tolerate intense rehabilitation     Barriers to Discharge        Equipment Recommendations  None recommended by PT    Recommendations for Other Services    Frequency Min 4X/week   Progress towards PT Goals Progress towards PT goals: Progressing toward goals  Plan Discharge plan needs to be updated    Precautions / Restrictions Precautions Precautions: Back;Fall Precaution Comments: Back precautions for comfort   Pertinent Vitals/Pain 10/10, but much better able to participate    Mobility  Bed Mobility Overal bed mobility: Needs Assistance Bed Mobility: Rolling;Sidelying to Sit;Supine to Sit;Sit to Supine;Sit to Sidelying Rolling: Supervision Sidelying to sit: Supervision Supine to sit: Supervision Sit to supine: Supervision Sit to sidelying: Supervision General bed mobility comments: No need for physical assist today; Able to place self in pronelying position Transfers Overall transfer level: Needs assistance Equipment used: None Transfers: Sit to/from Stand Sit to Stand: Supervision General transfer comment: Noted heavy dependence on hands, but no need for physical assist Ambulation/Gait Ambulation/Gait assistance: Supervision;Modified independent (Device/Increase time) Ambulation  Distance (Feet): 150 Feet Assistive device: None Gait velocity: approaching WNL General Gait Details: Noted increased lateral sway, and initially with tendency to reach out for UE support; no loss of balance noted; pt states she uses RW at  home prn Stairs: Yes Stairs assistance: Min assist Stair Management: No rails;Step to pattern;Forwards Number of Stairs: 3 General stair comments: Gave ideas for pt to give instruction to person helping her with the stairs    Exercises Other Exercises Other Exercises: Discussed and demonstrated McKenzie extension therex; Discussed the need for Outpt PT for back pain   PT Diagnosis:    PT Problem List:   PT Treatment Interventions:     PT Goals (current goals can now be found in the care plan section) Acute Rehab PT Goals Patient Stated Goal: Decreased back pain PT Goal Formulation: With patient Time For Goal Achievement: 09/24/13 Potential to Achieve Goals: Good  Visit Information  Last PT Received On: 09/11/13 Assistance Needed: +1 History of Present Illness: Pt is a 38y/o female admitted w/ primary issue seems to be refractory cough, chronic pain and anxiety .  Difficult to tell if there is any true bronchospasm at all. The primary contributing factors here are a) persistent nasal gtt b) worsening and poorly controlled reflux and c) some degree of anxiety/chronic pain. 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  High risk as multiple hospital admission with 3 previous intubation.  Will treat possible sinusitis and although not convinced this is asthma will cont systemic steroids and scheduled BDs. Patient is a 38 year old right-handed female who has a significant history of asthma with recent decompensation she tells me however that she's been troubled with back pain and bilateral leg pain since she had surgery in April 2014. [facility]. Mayford KnifeWilliams. She notes she had a surgery previously which included a fusion. She was told that she had broken screws in her fusion and this apparently was  repaired. In November of 2014 she underwent a plain x-ray of her lumbar spine X. [place] and this reveals the presence of an interbody arthrodesis at L5-S1 with pedicle screw fixation and a broken screw tip in the S1 pedicle on the right. She notes that after the surgery she had bilateral leg pain and she did not have this before the surgery. This has not gotten any better. She states that she has not had a CT or MRI since her surgery. She states that she cannot afford or tolerate the trip to [place] for further followup. She has recently moved to the [place] area.    Subjective Data  Subjective: REALLY wants to go home today Patient Stated Goal: Decreased back pain   Cognition  Cognition Arousal/Alertness: Awake/alert Behavior During Therapy: WFL for tasks assessed/performed Overall Cognitive Status: Within Functional Limits for tasks assessed    Balance     End of Session PT - End of Session Activity Tolerance: Patient tolerated treatment well Patient left: in bed;with call bell/phone within reach Nurse Communication: Mobility status   GP     Julie Kerr, Julie Kerr 09/11/2013, 5:03 PM

## 2013-09-12 LAB — CULTURE, BLOOD (ROUTINE X 2)
CULTURE: NO GROWTH
Culture: NO GROWTH

## 2013-10-29 ENCOUNTER — Encounter (HOSPITAL_COMMUNITY): Payer: Self-pay | Admitting: Emergency Medicine

## 2013-10-29 ENCOUNTER — Inpatient Hospital Stay (HOSPITAL_COMMUNITY)
Admission: EM | Admit: 2013-10-29 | Discharge: 2013-11-01 | DRG: 189 | Disposition: A | Discharge status: Home / Self Care | Payer: Medicaid Other | Attending: Internal Medicine | Admitting: Internal Medicine

## 2013-10-29 ENCOUNTER — Inpatient Hospital Stay (HOSPITAL_COMMUNITY): Payer: Medicaid Other

## 2013-10-29 ENCOUNTER — Emergency Department (HOSPITAL_COMMUNITY): Payer: Medicaid Other

## 2013-10-29 DIAGNOSIS — F411 Generalized anxiety disorder: Secondary | ICD-10-CM | POA: Diagnosis present

## 2013-10-29 DIAGNOSIS — D649 Anemia, unspecified: Secondary | ICD-10-CM | POA: Diagnosis present

## 2013-10-29 DIAGNOSIS — R0602 Shortness of breath: Secondary | ICD-10-CM | POA: Insufficient documentation

## 2013-10-29 DIAGNOSIS — A419 Sepsis, unspecified organism: Secondary | ICD-10-CM

## 2013-10-29 DIAGNOSIS — J9600 Acute respiratory failure, unspecified whether with hypoxia or hypercapnia: Principal | ICD-10-CM | POA: Diagnosis present

## 2013-10-29 DIAGNOSIS — T380X5A Adverse effect of glucocorticoids and synthetic analogues, initial encounter: Secondary | ICD-10-CM | POA: Diagnosis present

## 2013-10-29 DIAGNOSIS — Z981 Arthrodesis status: Secondary | ICD-10-CM

## 2013-10-29 DIAGNOSIS — J383 Other diseases of vocal cords: Secondary | ICD-10-CM | POA: Diagnosis present

## 2013-10-29 DIAGNOSIS — M25559 Pain in unspecified hip: Secondary | ICD-10-CM | POA: Diagnosis present

## 2013-10-29 DIAGNOSIS — J45901 Unspecified asthma with (acute) exacerbation: Secondary | ICD-10-CM

## 2013-10-29 DIAGNOSIS — M549 Dorsalgia, unspecified: Secondary | ICD-10-CM | POA: Diagnosis present

## 2013-10-29 DIAGNOSIS — J9601 Acute respiratory failure with hypoxia: Secondary | ICD-10-CM | POA: Diagnosis present

## 2013-10-29 DIAGNOSIS — R7309 Other abnormal glucose: Secondary | ICD-10-CM | POA: Diagnosis present

## 2013-10-29 DIAGNOSIS — R0600 Dyspnea, unspecified: Secondary | ICD-10-CM

## 2013-10-29 DIAGNOSIS — Y9210 Unspecified residential institution as the place of occurrence of the external cause: Secondary | ICD-10-CM | POA: Diagnosis present

## 2013-10-29 DIAGNOSIS — J441 Chronic obstructive pulmonary disease with (acute) exacerbation: Secondary | ICD-10-CM

## 2013-10-29 DIAGNOSIS — G894 Chronic pain syndrome: Secondary | ICD-10-CM | POA: Diagnosis present

## 2013-10-29 DIAGNOSIS — F17200 Nicotine dependence, unspecified, uncomplicated: Secondary | ICD-10-CM | POA: Diagnosis present

## 2013-10-29 DIAGNOSIS — M79609 Pain in unspecified limb: Secondary | ICD-10-CM | POA: Diagnosis present

## 2013-10-29 DIAGNOSIS — Z79899 Other long term (current) drug therapy: Secondary | ICD-10-CM

## 2013-10-29 DIAGNOSIS — D589 Hereditary hemolytic anemia, unspecified: Secondary | ICD-10-CM | POA: Diagnosis present

## 2013-10-29 DIAGNOSIS — Z9181 History of falling: Secondary | ICD-10-CM

## 2013-10-29 DIAGNOSIS — R061 Stridor: Secondary | ICD-10-CM | POA: Insufficient documentation

## 2013-10-29 LAB — I-STAT ARTERIAL BLOOD GAS, ED
ACID-BASE DEFICIT: 4 mmol/L — AB (ref 0.0–2.0)
BICARBONATE: 19.4 meq/L — AB (ref 20.0–24.0)
O2 SAT: 96 %
Patient temperature: 98.6
TCO2: 20 mmol/L (ref 0–100)
pCO2 arterial: 29 mmHg — ABNORMAL LOW (ref 35.0–45.0)
pH, Arterial: 7.434 (ref 7.350–7.450)
pO2, Arterial: 80 mmHg (ref 80.0–100.0)

## 2013-10-29 LAB — COMPREHENSIVE METABOLIC PANEL
ALT: 12 U/L (ref 0–35)
AST: 5 U/L (ref 0–37)
Albumin: 4 g/dL (ref 3.5–5.2)
Alkaline Phosphatase: 54 U/L (ref 39–117)
BUN: 12 mg/dL (ref 6–23)
CALCIUM: 9.7 mg/dL (ref 8.4–10.5)
CO2: 21 meq/L (ref 19–32)
Chloride: 99 mEq/L (ref 96–112)
Creatinine, Ser: 1.06 mg/dL (ref 0.50–1.10)
GFR calc Af Amer: 78 mL/min — ABNORMAL LOW (ref >90)
GFR, EST NON AFRICAN AMERICAN: 67 mL/min — AB (ref >90)
GLUCOSE: 98 mg/dL (ref 70–99)
Potassium: 3.9 mEq/L (ref 3.7–5.3)
Sodium: 138 mEq/L (ref 137–147)
TOTAL PROTEIN: 7.6 g/dL (ref 6.0–8.3)
Total Bilirubin: <0.2 mg/dL — ABNORMAL LOW (ref 0.3–1.2)

## 2013-10-29 LAB — CBC WITH DIFFERENTIAL/PLATELET
Basophils Absolute: 0 10*3/uL (ref 0.0–0.1)
Basophils Relative: 0 % (ref 0–1)
EOS ABS: 0.1 10*3/uL (ref 0.0–0.7)
EOS PCT: 1 % (ref 0–5)
HCT: 34.1 % — ABNORMAL LOW (ref 36.0–46.0)
HEMOGLOBIN: 11.5 g/dL — AB (ref 12.0–15.0)
LYMPHS ABS: 4 10*3/uL (ref 0.7–4.0)
LYMPHS PCT: 38 % (ref 12–46)
MCH: 25.8 pg — AB (ref 26.0–34.0)
MCHC: 33.7 g/dL (ref 30.0–36.0)
MCV: 76.6 fL — AB (ref 78.0–100.0)
MONOS PCT: 5 % (ref 3–12)
Monocytes Absolute: 0.5 10*3/uL (ref 0.1–1.0)
Neutro Abs: 5.8 10*3/uL (ref 1.7–7.7)
Neutrophils Relative %: 56 % (ref 43–77)
Platelets: 429 10*3/uL — ABNORMAL HIGH (ref 150–400)
RBC: 4.45 MIL/uL (ref 3.87–5.11)
RDW: 17.8 % — ABNORMAL HIGH (ref 11.5–15.5)
WBC: 10.3 10*3/uL (ref 4.0–10.5)

## 2013-10-29 LAB — RAPID URINE DRUG SCREEN, HOSP PERFORMED
Amphetamines: NOT DETECTED
BENZODIAZEPINES: NOT DETECTED
Barbiturates: NOT DETECTED
Cocaine: NOT DETECTED
Opiates: NOT DETECTED
Tetrahydrocannabinol: NOT DETECTED

## 2013-10-29 LAB — MRSA PCR SCREENING: MRSA BY PCR: NEGATIVE

## 2013-10-29 LAB — PRO B NATRIURETIC PEPTIDE: PRO B NATRI PEPTIDE: 8.1 pg/mL (ref 0–125)

## 2013-10-29 MED ORDER — ACETAMINOPHEN 325 MG PO TABS
650.0000 mg | ORAL_TABLET | Freq: Four times a day (QID) | ORAL | Status: DC | PRN
  Administered 2013-10-29: 650 mg via ORAL
  Filled 2013-10-29: qty 2

## 2013-10-29 MED ORDER — IPRATROPIUM BROMIDE 0.02 % IN SOLN
0.5000 mg | Freq: Four times a day (QID) | RESPIRATORY_TRACT | Status: DC
  Administered 2013-10-29: 0.5 mg via RESPIRATORY_TRACT
  Filled 2013-10-29: qty 2.5

## 2013-10-29 MED ORDER — LIDOCAINE HCL (CARDIAC) 20 MG/ML IV SOLN
INTRAVENOUS | Status: AC
  Filled 2013-10-29: qty 5

## 2013-10-29 MED ORDER — ENOXAPARIN SODIUM 40 MG/0.4ML ~~LOC~~ SOLN
40.0000 mg | SUBCUTANEOUS | Status: DC
  Administered 2013-10-29 – 2013-10-31 (×3): 40 mg via SUBCUTANEOUS
  Filled 2013-10-29 (×4): qty 0.4

## 2013-10-29 MED ORDER — MAGNESIUM SULFATE 40 MG/ML IJ SOLN
2.0000 g | Freq: Once | INTRAMUSCULAR | Status: AC
  Administered 2013-10-29: 2 g via INTRAVENOUS
  Filled 2013-10-29: qty 50

## 2013-10-29 MED ORDER — ONDANSETRON HCL 4 MG PO TABS: 4.0000 mg | ORAL_TABLET | Freq: Four times a day (QID) | ORAL | Status: DC | PRN

## 2013-10-29 MED ORDER — FENTANYL CITRATE 0.05 MG/ML IJ SOLN
50.0000 ug | Freq: Once | INTRAMUSCULAR | Status: AC
  Administered 2013-10-29: 50 ug via INTRAVENOUS

## 2013-10-29 MED ORDER — GABAPENTIN 300 MG PO CAPS
600.0000 mg | ORAL_CAPSULE | Freq: Three times a day (TID) | ORAL | Status: DC
  Administered 2013-10-29 – 2013-11-01 (×9): 600 mg via ORAL
  Filled 2013-10-29 (×12): qty 2

## 2013-10-29 MED ORDER — ACETAMINOPHEN 650 MG RE SUPP: 650.0000 mg | Freq: Four times a day (QID) | RECTAL | Status: DC | PRN

## 2013-10-29 MED ORDER — SODIUM CHLORIDE 0.9 % IV SOLN
INTRAVENOUS | Status: DC
  Administered 2013-10-29: 02:00:00 via INTRAVENOUS

## 2013-10-29 MED ORDER — RACEPINEPHRINE HCL 2.25 % IN NEBU
INHALATION_SOLUTION | RESPIRATORY_TRACT | Status: AC
  Administered 2013-10-29: 0.5 mL via RESPIRATORY_TRACT
  Filled 2013-10-29: qty 0.5

## 2013-10-29 MED ORDER — LORAZEPAM 2 MG/ML IJ SOLN
0.5000 mg | Freq: Once | INTRAMUSCULAR | Status: AC
  Administered 2013-10-29: 0.5 mg via INTRAVENOUS

## 2013-10-29 MED ORDER — IPRATROPIUM-ALBUTEROL 0.5-2.5 (3) MG/3ML IN SOLN: 3.0000 mL | RESPIRATORY_TRACT | Status: DC | PRN

## 2013-10-29 MED ORDER — ALBUTEROL (5 MG/ML) CONTINUOUS INHALATION SOLN
INHALATION_SOLUTION | RESPIRATORY_TRACT | Status: AC
  Filled 2013-10-29: qty 20

## 2013-10-29 MED ORDER — IPRATROPIUM-ALBUTEROL 0.5-2.5 (3) MG/3ML IN SOLN
3.0000 mL | Freq: Three times a day (TID) | RESPIRATORY_TRACT | Status: DC
  Administered 2013-10-29 – 2013-10-30 (×2): 3 mL via RESPIRATORY_TRACT
  Filled 2013-10-29 (×3): qty 3

## 2013-10-29 MED ORDER — ALBUTEROL SULFATE (2.5 MG/3ML) 0.083% IN NEBU: 2.5000 mg | INHALATION_SOLUTION | RESPIRATORY_TRACT | Status: DC | PRN

## 2013-10-29 MED ORDER — LORAZEPAM 2 MG/ML IJ SOLN
INTRAMUSCULAR | Status: AC
  Administered 2013-10-29: 0.5 mg via INTRAVENOUS
  Filled 2013-10-29: qty 1

## 2013-10-29 MED ORDER — ALPRAZOLAM 0.5 MG PO TABS
1.0000 mg | ORAL_TABLET | Freq: Three times a day (TID) | ORAL | Status: DC
  Administered 2013-10-29 – 2013-11-01 (×11): 1 mg via ORAL
  Filled 2013-10-29 (×11): qty 2

## 2013-10-29 MED ORDER — MONTELUKAST SODIUM 10 MG PO TABS
10.0000 mg | ORAL_TABLET | Freq: Every day | ORAL | Status: DC
  Administered 2013-10-29 – 2013-11-01 (×4): 10 mg via ORAL
  Filled 2013-10-29 (×4): qty 1

## 2013-10-29 MED ORDER — ROCURONIUM BROMIDE 50 MG/5ML IV SOLN
INTRAVENOUS | Status: AC
  Filled 2013-10-29: qty 2

## 2013-10-29 MED ORDER — ENSURE COMPLETE PO LIQD
237.0000 mL | Freq: Two times a day (BID) | ORAL | Status: DC
  Administered 2013-10-29 – 2013-11-01 (×7): 237 mL via ORAL

## 2013-10-29 MED ORDER — METHYLPREDNISOLONE SODIUM SUCC 40 MG IJ SOLR
40.0000 mg | Freq: Two times a day (BID) | INTRAMUSCULAR | Status: DC
  Administered 2013-10-29: 40 mg via INTRAVENOUS
  Filled 2013-10-29 (×3): qty 1

## 2013-10-29 MED ORDER — PNEUMOCOCCAL VAC POLYVALENT 25 MCG/0.5ML IJ INJ
0.5000 mL | INJECTION | INTRAMUSCULAR | Status: AC
  Administered 2013-10-30: 0.5 mL via INTRAMUSCULAR
  Filled 2013-10-29: qty 0.5

## 2013-10-29 MED ORDER — BUDESONIDE 0.25 MG/2ML IN SUSP
0.2500 mg | Freq: Two times a day (BID) | RESPIRATORY_TRACT | Status: DC
  Administered 2013-10-29 – 2013-11-01 (×6): 0.25 mg via RESPIRATORY_TRACT
  Filled 2013-10-29 (×10): qty 2

## 2013-10-29 MED ORDER — DEXTROSE 5 % IV SOLN
500.0000 mg | INTRAVENOUS | Status: DC
  Administered 2013-10-29: 500 mg via INTRAVENOUS
  Filled 2013-10-29 (×2): qty 500

## 2013-10-29 MED ORDER — PANTOPRAZOLE SODIUM 40 MG PO TBEC
40.0000 mg | DELAYED_RELEASE_TABLET | Freq: Every day | ORAL | Status: DC
  Administered 2013-10-29 – 2013-11-01 (×4): 40 mg via ORAL
  Filled 2013-10-29 (×4): qty 1

## 2013-10-29 MED ORDER — INFLUENZA VAC SPLIT QUAD 0.5 ML IM SUSP
0.5000 mL | INTRAMUSCULAR | Status: AC
  Administered 2013-10-30: 0.5 mL via INTRAMUSCULAR
  Filled 2013-10-29: qty 0.5

## 2013-10-29 MED ORDER — ALBUTEROL SULFATE (2.5 MG/3ML) 0.083% IN NEBU
5.0000 mg | INHALATION_SOLUTION | Freq: Once | RESPIRATORY_TRACT | Status: AC
Start: 2013-10-29 — End: 2013-10-29
  Administered 2013-10-29: 5 mg via RESPIRATORY_TRACT

## 2013-10-29 MED ORDER — RACEPINEPHRINE HCL 2.25 % IN NEBU
0.5000 mL | INHALATION_SOLUTION | Freq: Once | RESPIRATORY_TRACT | Status: AC
  Administered 2013-10-29: 0.5 mL via RESPIRATORY_TRACT

## 2013-10-29 MED ORDER — SODIUM CHLORIDE 0.9 % IJ SOLN
3.0000 mL | Freq: Two times a day (BID) | INTRAMUSCULAR | Status: DC
  Administered 2013-10-29 – 2013-10-31 (×4): 3 mL via INTRAVENOUS

## 2013-10-29 MED ORDER — METHYLPREDNISOLONE SODIUM SUCC 125 MG IJ SOLR
125.0000 mg | Freq: Once | INTRAMUSCULAR | Status: AC
  Administered 2013-10-29: 125 mg via INTRAVENOUS

## 2013-10-29 MED ORDER — IPRATROPIUM BROMIDE 0.02 % IN SOLN
0.5000 mg | Freq: Once | RESPIRATORY_TRACT | Status: AC
Start: 2013-10-29 — End: 2013-10-29
  Administered 2013-10-29: 0.5 mg via RESPIRATORY_TRACT

## 2013-10-29 MED ORDER — ONDANSETRON HCL 4 MG/2ML IJ SOLN: 4.0000 mg | Freq: Four times a day (QID) | INTRAMUSCULAR | Status: DC | PRN

## 2013-10-29 MED ORDER — ALBUTEROL SULFATE (2.5 MG/3ML) 0.083% IN NEBU
2.5000 mg | INHALATION_SOLUTION | RESPIRATORY_TRACT | Status: DC
  Administered 2013-10-29: 2.5 mg via RESPIRATORY_TRACT
  Filled 2013-10-29: qty 3

## 2013-10-29 MED ORDER — MORPHINE SULFATE ER 15 MG PO TBCR
15.0000 mg | EXTENDED_RELEASE_TABLET | Freq: Two times a day (BID) | ORAL | Status: DC
  Administered 2013-10-29 (×2): 15 mg via ORAL
  Filled 2013-10-29 (×2): qty 1

## 2013-10-29 MED ORDER — BENZONATATE 100 MG PO CAPS
100.0000 mg | ORAL_CAPSULE | Freq: Three times a day (TID) | ORAL | Status: DC | PRN
  Filled 2013-10-29: qty 1

## 2013-10-29 MED ORDER — LEVOFLOXACIN 500 MG PO TABS
500.0000 mg | ORAL_TABLET | Freq: Every day | ORAL | Status: DC
  Administered 2013-10-29 – 2013-11-01 (×4): 500 mg via ORAL
  Filled 2013-10-29 (×4): qty 1

## 2013-10-29 MED ORDER — ZOLPIDEM TARTRATE 5 MG PO TABS
5.0000 mg | ORAL_TABLET | Freq: Every day | ORAL | Status: DC
  Administered 2013-10-29 – 2013-10-31 (×3): 5 mg via ORAL
  Filled 2013-10-29 (×3): qty 1

## 2013-10-29 MED ORDER — SUCCINYLCHOLINE CHLORIDE 20 MG/ML IJ SOLN
INTRAMUSCULAR | Status: AC
  Administered 2013-10-29: 100 mg
  Filled 2013-10-29: qty 1

## 2013-10-29 MED ORDER — METHYLPREDNISOLONE SODIUM SUCC 40 MG IJ SOLR
40.0000 mg | Freq: Every day | INTRAMUSCULAR | Status: DC
  Administered 2013-10-29: 40 mg via INTRAVENOUS
  Filled 2013-10-29: qty 1

## 2013-10-29 MED ORDER — ENOXAPARIN SODIUM 40 MG/0.4ML ~~LOC~~ SOLN
40.0000 mg | SUBCUTANEOUS | Status: DC
  Administered 2013-10-29: 40 mg via SUBCUTANEOUS
  Filled 2013-10-29: qty 0.4

## 2013-10-29 MED ORDER — FENTANYL CITRATE 0.05 MG/ML IJ SOLN
INTRAMUSCULAR | Status: AC
  Administered 2013-10-29: 50 ug via INTRAVENOUS
  Filled 2013-10-29: qty 2

## 2013-10-29 MED ORDER — ETOMIDATE 2 MG/ML IV SOLN
INTRAVENOUS | Status: AC
  Administered 2013-10-29: 20 mg
  Filled 2013-10-29: qty 20

## 2013-10-29 MED ORDER — OXYCODONE HCL 5 MG PO TABS
15.0000 mg | ORAL_TABLET | ORAL | Status: DC | PRN
Start: 2013-10-29 — End: 2013-11-01
  Administered 2013-10-29 – 2013-11-01 (×12): 15 mg via ORAL
  Filled 2013-10-29 (×13): qty 3

## 2013-10-29 MED ORDER — SODIUM CHLORIDE 0.9 % IJ SOLN
3.0000 mL | Freq: Two times a day (BID) | INTRAMUSCULAR | Status: DC
  Administered 2013-10-29 – 2013-11-01 (×9): 3 mL via INTRAVENOUS

## 2013-10-29 MED ORDER — CYCLOBENZAPRINE HCL 10 MG PO TABS
10.0000 mg | ORAL_TABLET | Freq: Three times a day (TID) | ORAL | Status: DC | PRN
  Administered 2013-10-29 – 2013-10-30 (×2): 10 mg via ORAL
  Filled 2013-10-29 (×2): qty 1

## 2013-10-29 NOTE — ED Notes (Signed)
Tolerating Bipap, [facility]

## 2013-10-29 NOTE — Progress Notes (Signed)
Patient started on Bipap, verbal order for CAT given, started at 0237 after initiation of Bipap. Patient then given racemic epinephrine, tolerated well and responded to therapy. Patient continues on Bipap at this time. RT will continue to monitor.

## 2013-10-29 NOTE — ED Notes (Addendum)
Resting with eyes closed, NAD, calm, resps e/u, no dyspnea, resps deep and quiet, no stridor or audible adventitious airway sounds heard, VSS, SPO2 100% 4L.

## 2013-10-29 NOTE — ED Notes (Signed)
Continues to struggle on Bipap, insp/expiritory stridor continue, RR 36, SPO2 100%.

## 2013-10-29 NOTE — ED Notes (Addendum)
Pt now behaving confused, "does not remember saying that she was feeling better", says that "she does feel better", stridorous like sounds have returned, 100% RA, pt has taken off [place], [place] returned, back on 4L. Asks again, "what did ya'll take out of me", c/o rib/diaphragm/upper bilateral abd pain. Pt updated, re-oriented. plan for probable admission discussed. intermittant cough present.

## 2013-10-29 NOTE — ED Notes (Signed)
[facility]elverton [facility], preparing to [facility], discussed with pt, verbal consent received (nodded head OK). RN x2, RT and EDP present.

## 2013-10-29 NOTE — ED Notes (Signed)
[facility]

## 2013-10-29 NOTE — ED Notes (Signed)
Pt sleeping, NAD, calm, resps e/u. No coughing or crying at this time.

## 2013-10-29 NOTE — ED Provider Notes (Signed)
CSN: [id number]     Arrival date & time 10/29/13  0211 History   First MD Initiated Contact with Patient 10/29/13 0215     Chief Complaint  Patient presents with  . Respiratory Distress     (Consider location/radiation/quality/duration/timing/severity/associated sxs/prior Treatment) HPI History is limited due to respiratory distress. Level V caveat applies. Patient states she's had shortness of breath for one day. She states is her normal asthma exacerbation. She's had minimal cough. She denies any fevers or chills. She's had multiple previous admissions for similar presentations. She's been intubated 3 times in the past. Past Medical History  Diagnosis Date  . Asthma   . Anxiety   . COPD (chronic obstructive pulmonary disease)   . Bronchitis    Past Surgical History  Procedure Laterality Date  . Spinal fusion     History reviewed. No pertinent family history. History  Substance Use Topics  . Smoking status: Current Every Day Smoker  . Smokeless tobacco: Never Used  . Alcohol Use: No   OB History   Grav Para Term Preterm Abortions TAB SAB Ect Mult Living                 Review of Systems  Constitutional: Negative for fever and chills.  Respiratory: Positive for cough, shortness of breath, wheezing and stridor.   Cardiovascular: Negative for chest pain, palpitations and leg swelling.  Gastrointestinal: Negative for nausea, vomiting and abdominal pain.  Musculoskeletal: Negative for back pain.  Skin: Negative for rash and wound.  All other systems reviewed and are negative.      Allergies  Robitussin dm; Nsaids; and Tramadol  Home Medications   Current Outpatient Rx  Name  Route  Sig  Dispense  Refill  . albuterol (PROVENTIL HFA;VENTOLIN HFA) 108 (90 BASE) MCG/ACT inhaler   Inhalation   Inhale 2 puffs into the lungs every 4 (four) hours as needed for wheezing.   3.7 g   1   . albuterol (PROVENTIL) (5 MG/ML) 0.5% nebulizer solution   Nebulization   Take 0.5  mLs (2.5 mg total) by nebulization every 6 (six) hours as needed for wheezing or shortness of breath.   20 mL   1   . ALPRAZolam (XANAX) 1 MG tablet   Oral   Take 1 mg by mouth 3 (three) times daily.         . benzonatate (TESSALON) 200 MG capsule   Oral   Take 1 capsule (200 mg total) by mouth 3 (three) times daily as needed for cough.   20 capsule   0   . cyclobenzaprine (FLEXERIL) 10 MG tablet   Oral   Take 10 mg by mouth 3 (three) times daily as needed for muscle spasms.         . feeding supplement, ENSURE COMPLETE, (ENSURE COMPLETE) LIQD   Oral   Take 237 mLs by mouth 2 (two) times daily between meals.         . Fluticasone-Salmeterol (ADVAIR) 250-50 MCG/DOSE AEPB   Inhalation   Inhale 1 puff into the lungs 2 (two) times daily.         Marland Kitchen gabapentin (NEURONTIN) 300 MG capsule   Oral   Take 2 capsules (600 mg total) by mouth 3 (three) times daily.   180 capsule   0   . guaiFENesin (MUCINEX) 600 MG 12 hr tablet   Oral   Take 1 tablet (600 mg total) by mouth 2 (two) times daily.   30 tablet  0   . montelukast (SINGULAIR) 10 MG tablet   Oral   Take 10 mg by mouth daily.          Marland Kitchen. morphine (MS CONTIN) 15 MG 12 hr tablet   Oral   Take 15 mg by mouth every 12 (twelve) hours.         Marland Kitchen. oxyCODONE (ROXICODONE) 15 MG immediate release tablet   Oral   Take 15 mg by mouth every 4 (four) hours as needed for pain.         . pantoprazole (PROTONIX) 40 MG tablet   Oral   Take 1 tablet (40 mg total) by mouth daily.   30 tablet   0   . predniSONE (DELTASONE) 20 MG tablet   Oral   Take 2 tablets (40 mg total) by mouth daily.   8 tablet   0     Start at 6 PM tonight   . zolpidem (AMBIEN) 10 MG tablet   Oral   Take 10 mg by mouth at bedtime.          BP 100/63  Pulse 96  Resp 35  SpO2 98% Physical Exam  Nursing note and vitals reviewed. Constitutional: She is oriented to person, place, and time. She appears well-developed and well-nourished.  She appears distressed.  HENT:  Head: Normocephalic and atraumatic.  Mouth/Throat: Oropharynx is clear and moist.  Eyes: EOM are normal. Pupils are equal, round, and reactive to light.  Neck: Normal range of motion. Neck supple. No tracheal deviation present.  Cardiovascular: Normal rate and regular rhythm.   Pulmonary/Chest: Effort normal and breath sounds normal. Stridor present. No respiratory distress. She has no wheezes. She has no rales. She exhibits no tenderness.  Speaking in one-word sentences. Patient is using accessory muscles to breathe. She has both inspiratory and extra stridor. She has no definite wheezing but some mild decrease in breath sounds bilaterally lung fields  Abdominal: Soft. Bowel sounds are normal. She exhibits no distension and no mass. There is no tenderness. There is no rebound and no guarding.  Musculoskeletal: Normal range of motion. She exhibits no edema and no tenderness.  No calf swelling or tenderness.  Neurological: She is alert and oriented to person, place, and time.  Results are as without deficit. Sensation is grossly intact.  Skin: Skin is warm and dry. No rash noted. No erythema.  Psychiatric:  Anxious appearing    ED Course  INTUBATION Date/Time: 10/29/2013 5:10 AM Performed by: Loren RacerYELVERTON, Exzavier Ruderman Authorized by: Ranae PalmsYELVERTON, Caly Pellum Consent: Verbal consent obtained. The procedure was performed in an emergent situation. Indications: respiratory distress and  airway protection Intubation method: video-assisted Patient status: paralyzed (RSI) Preoxygenation: BVM Sedatives: etomidate Paralytic: succinylcholine Laryngoscope size: Mac 3 Tube size: 7.0 mm Tube type: cuffed Number of attempts: 2 Ventilation between attempts: BVM Cricoid pressure: yes Cords visualized: yes Comments: Unsuccessful intubation attempt. Vocal cords visualized with mild swelling but widely patent   (including critical care time) Labs Review Labs Reviewed  CBC WITH  DIFFERENTIAL - Abnormal; Notable for the following:    Hemoglobin 11.5 (*)    HCT 34.1 (*)    MCV 76.6 (*)    MCH 25.8 (*)    RDW 17.8 (*)    Platelets 429 (*)    All other components within normal limits  COMPREHENSIVE METABOLIC PANEL - Abnormal; Notable for the following:    Total Bilirubin <0.2 (*)    GFR calc non Af Amer 67 (*)    GFR  calc Af Amer 78 (*)    All other components within normal limits  PRO B NATRIURETIC PEPTIDE  URINE RAPID DRUG SCREEN (HOSP PERFORMED)  BLOOD GAS, ARTERIAL   Imaging Review Dg Chest Port 1 View  10/29/2013   CLINICAL DATA:  Wheezing.  Respiratory distress.  EXAM: PORTABLE CHEST - 1 VIEW  COMPARISON:  Single view of the chest 09/06/2013 and 04/07/2013.  FINDINGS: Lungs are clear. Heart size is upper normal. No pneumothorax or pleural effusion.  IMPRESSION: No acute disease.   Electronically Signed   By: Drusilla Kanner M.D.   On: 10/29/2013 02:33     EKG Interpretation None     CRITICAL CARE Performed by: Ranae Palms, Shaden Higley Total critical care time: 50 min Critical care time was exclusive of separately billable procedures and treating other patients. Critical care was necessary to treat or prevent imminent or life-threatening deterioration. Critical care was time spent personally by me on the following activities: development of treatment plan with patient and/or surrogate as well as nursing, discussions with consultants, evaluation of patient's response to treatment, examination of patient, obtaining history from patient or surrogate, ordering and performing treatments and interventions, ordering and review of laboratory studies, ordering and review of radiographic studies, pulse oximetry and re-evaluation of patient's condition.  MDM   Final diagnoses:  Stridor  Dyspnea    Initially the patient's symptoms were thought likely asthma-related. She was treated with a continuous neb, magnesium, Solu-Medrol and BiPAP. Patient was also given low-dose  Ativan and observed very closely. She continued to have no improvement though her saturations remained 100%. Discussed intubation with the patient due to concern for impending upper airway occlusion. She consented verbally. Patient was given RSI medication. Vocal cords were identified with a lighted scope. She had some mild vocal fold swelling but the airway was widely patent. Some difficulty passing the ET tube due to the anterior position of the cords. Patient did not desaturate. On second attempt patient began to regain respiratory reflexes and was in no respiratory distress. She had no stridor. It was decided that point to simply observe. Patient alert and in no respiratory distress. No stridor noted. Based on her prior visits, her shortness of breath was thought likely due to to do to vocal cord dysfunction.   I discussed with the hospitalist who is familiar with the patient. There saw to be some psychiatric component involved. Patient remains stable on nasal cannula. She'll be observed closely in the step down unit. Patient continues not require intubation at this point.    Loren Racer, MD 10/29/13 [phone]

## 2013-10-29 NOTE — ED Notes (Signed)
Tolerating sitting up HOB 45degrees, on O2 [place] 4L. VSS.

## 2013-10-29 NOTE — Progress Notes (Signed)
Julie Kerr is a 38 y.o. female patient who transferred  from [facility]H 02 awake, alert  & orientated  X 3, Full Code, VSS - Blood pressure 106/58, pulse 101, temperature 98.6 F (37 C), temperature source Oral, resp. rate 20, height 5\' 2"  (1.575 m), weight 85.7 kg (188 lb 15 oz), last menstrual period 10/03/2013, SpO2 95.00%., R/A, no c/o shortness of breath, no c/o chest pain, no distress noted. Non- Tele:   IV site WDL: antecubital left & right wrist, condition patent and no redness with a transparent dsg that's clean dry and intact.  Allergies:   Allergies  Allergen Reactions  . Robitussin Dm [Dextromethorphan-Guaifenesin] Nausea And Vomiting  . Nsaids Hives  . Tramadol Hives     Past Medical History  Diagnosis Date  . Asthma   . Anxiety   . COPD (chronic obstructive pulmonary disease)   . Bronchitis     Pt orientation to unit, room and routine. SR up x 2, fall risk assessment complete with Patient and family verbalizing understanding of risks associated with falls. Pt verbalizes an understanding of how to use the call bell and to call for help before getting out of bed.  Skin, clean-dry- intact without evidence of bruising, or skin tears.   No evidence of skin break down noted on exam. no rashes    Will cont to monitor and assist as needed.  Joana ReamerJohnson, Luceal Hollibaugh C, RN 10/29/2013 5:57 PM

## 2013-10-29 NOTE — ED Notes (Signed)
Difficult intubation, unsuccessful with glydoscope or bougie. Rebagging. Preparing to re-attempt with MAC 4.

## 2013-10-29 NOTE — Progress Notes (Signed)
Pt seen and examined, admitted this am per Dr.kakarakandy with Acute resp failure from Asthma exacerbation, please see his note today for details Improving , no resp distress Continue IV solumedrol, nebs, DC Zithromax, add levaquin Add tessalon perles, allergic to Robitussin Transfer out of SDU Also check Xray hips, complains of hip pain bilateral and fall yesterday,  Lot of anxiety as well, continue anxiolytics  Zannie CovePreetha Loris Seelye, MD [phone]

## 2013-10-29 NOTE — H&P (Signed)
[facility] History and Physical  Julie Schildermanda Weitzman ZOX:[id number] DOB: 06/19/1976 DOA: 10/29/2013  Referring physician: ER physician. PCP: Lu DuffelWELLS,WENDELL, MD   Chief Complaint: Shortness of breath.  HPI: Julie Kerr is a 38 y.o. female with history of asthma/COPD/vocal cord dysfunction, chronic pain presents to the ER because of shortness of breath and wheezing. Patient states her symptoms started off yesterday. Denies any fever chills. Has been having productive cough. Has pleuritic-type of chest pain. In the ER patient was found to be acutely short of breath and was about to be intubated by ER physician. Intubation was later aborted by the ER physician. Since patient is still wheezing at this time patient has been admitted for further management. Patient otherwise denies any nausea vomiting abdominal pain.   Review of Systems: As presented in the history of presenting illness, rest negative.  Past Medical History  Diagnosis Date  . Asthma   . Anxiety   . COPD (chronic obstructive pulmonary disease)   . Bronchitis    Past Surgical History  Procedure Laterality Date  . Spinal fusion     Social History:  reports that she has been smoking.  She has never used smokeless tobacco. She reports that she does not drink alcohol. Her drug history is not on file. Where does patient live home. Can patient participate in ADLs? Yes.  Allergies  Allergen Reactions  . Robitussin Dm [Dextromethorphan-Guaifenesin] Nausea And Vomiting  . Nsaids Hives  . Tramadol Hives    Family History: History reviewed. No pertinent family history.    Prior to Admission medications   Medication Sig Start Date End Date Taking? Authorizing Provider  albuterol (PROVENTIL HFA;VENTOLIN HFA) 108 (90 BASE) MCG/ACT inhaler Inhale 2 puffs into the lungs every 4 (four) hours as needed for wheezing. 08/06/13   Maryann Mikhail, DO  albuterol (PROVENTIL) (5 MG/ML) 0.5% nebulizer solution Take 0.5 mLs (2.5 mg total) by  nebulization every 6 (six) hours as needed for wheezing or shortness of breath. 08/06/13   Maryann Mikhail, DO  ALPRAZolam Prudy Feeler(XANAX) 1 MG tablet Take 1 mg by mouth 3 (three) times daily.    Historical Provider, MD  benzonatate (TESSALON) 200 MG capsule Take 1 capsule (200 mg total) by mouth 3 (three) times daily as needed for cough. 08/06/13   Maryann Mikhail, DO  cyclobenzaprine (FLEXERIL) 10 MG tablet Take 10 mg by mouth 3 (three) times daily as needed for muscle spasms.    Historical Provider, MD  feeding supplement, ENSURE COMPLETE, (ENSURE COMPLETE) LIQD Take 237 mLs by mouth 2 (two) times daily between meals. 08/06/13   Maryann Mikhail, DO  Fluticasone-Salmeterol (ADVAIR) 250-50 MCG/DOSE AEPB Inhale 1 puff into the lungs 2 (two) times daily. 06/18/13   Elease EtienneAnand D Hongalgi, MD  gabapentin (NEURONTIN) 300 MG capsule Take 2 capsules (600 mg total) by mouth 3 (three) times daily. 09/11/13   Maryann Mikhail, DO  guaiFENesin (MUCINEX) 600 MG 12 hr tablet Take 1 tablet (600 mg total) by mouth 2 (two) times daily. 06/18/13   Elease EtienneAnand D Hongalgi, MD  montelukast (SINGULAIR) 10 MG tablet Take 10 mg by mouth daily.     Historical Provider, MD  morphine (MS CONTIN) 15 MG 12 hr tablet Take 15 mg by mouth every 12 (twelve) hours.    Historical Provider, MD  oxyCODONE (ROXICODONE) 15 MG immediate release tablet Take 15 mg by mouth every 4 (four) hours as needed for pain.    Historical Provider, MD  pantoprazole (PROTONIX) 40 MG tablet Take 1 tablet (40 mg  total) by mouth daily. 09/11/13   Maryann Mikhail, DO  predniSONE (DELTASONE) 20 MG tablet Take 2 tablets (40 mg total) by mouth daily. 09/03/13   Calvert Cantor, MD  zolpidem (AMBIEN) 10 MG tablet Take 10 mg by mouth at bedtime.    Historical Provider, MD    Physical Exam: Filed Vitals:   10/29/13 0310 10/29/13 0315 10/29/13 0330 10/29/13 0345  BP: 115/58  112/69 98/52  Pulse: 125 147 102 106  Resp: 23 0 18 23  SpO2: 100% 100% 96% 96%     General:   Well-developed and nourished.  Eyes: Anicteric no pallor.  ENT: No discharge from the ears eyes nose mouth.  Neck: No mass felt.  Cardiovascular: S1-S2 heard.  Respiratory: Bilateral expiratory wheezes and no crepitations.  Abdomen: Soft nontender bowel sounds present.  Skin: No rash.  Musculoskeletal: No edema.  Psychiatric: Appears normal.  Neurologic: Alert awake oriented to time place and person. Moves all extremities.  Labs on Admission:  Basic Metabolic Panel:  Recent Labs Lab 10/29/13 0220  NA 138  K 3.9  CL 99  CO2 21  GLUCOSE 98  BUN 12  CREATININE 1.06  CALCIUM 9.7   Liver Function Tests:  Recent Labs Lab 10/29/13 0220  AST 5  ALT 12  ALKPHOS 54  BILITOT <0.2*  PROT 7.6  ALBUMIN 4.0   No results found for this basename: LIPASE, AMYLASE,  in the last 168 hours No results found for this basename: AMMONIA,  in the last 168 hours CBC:  Recent Labs Lab 10/29/13 0220  WBC 10.3  NEUTROABS 5.8  HGB 11.5*  HCT 34.1*  MCV 76.6*  PLT 429*   Cardiac Enzymes: No results found for this basename: CKTOTAL, CKMB, CKMBINDEX, TROPONINI,  in the last 168 hours  BNP (last 3 results)  Recent Labs  06/14/13 1610 09/02/13 2358  PROBNP 26.1 <5.0   CBG: No results found for this basename: GLUCAP,  in the last 168 hours  Radiological Exams on Admission: Dg Chest Port 1 View  10/29/2013   CLINICAL DATA:  Wheezing.  Respiratory distress.  EXAM: PORTABLE CHEST - 1 VIEW  COMPARISON:  Single view of the chest 09/06/2013 and 04/07/2013.  FINDINGS: Lungs are clear. Heart size is upper normal. No pneumothorax or pleural effusion.  IMPRESSION: No acute disease.   Electronically Signed   By: Drusilla Kanner M.D.   On: 10/29/2013 02:33     Assessment/Plan Active Problems:   Acute respiratory failure   Chronic pain syndrome   Asthma exacerbation   Anemia   Vocal cord dysfunction   1. Acute respiratory failure - probably secondary to vocal cord  dysfunction/asthma bronchitis. Continue with bronchodilator IV steroids and I have placed patient on Zithromax. We will at this time closely observe in step down. 2. History of chronic pain - continue with her medications. 3. Anemia - chronic - follow CBC.  Have reviewed patient's old charts and chest x-ray personally.  Code Status: Full code.  Family Communication: None.  Disposition Plan: Admit to inpatient.    Syncere Eble N. [facility] Pager [phone].  If 7PM-7AM, please contact night-coverage [email or web address] Password [facility] 10/29/2013, 4:49 AM

## 2013-10-29 NOTE — ED Notes (Signed)
Dr. Toniann [facility]ailKakrakandy [facility] see pt[facility]

## 2013-10-29 NOTE — ED Notes (Signed)
[facility] continues, mag infusing, ativan given, pt asking for Bipap, [facility], pt has a h/o intubation.

## 2013-10-29 NOTE — ED Notes (Signed)
Resting with eyes closed, NAD, calm, resps e/u, no dyspnea, resps deep and quiet, no stridor or audible adventitious airway sounds heard, VSS, SPO2  96% RA. Pt has removed .

## 2013-10-29 NOTE — Care Management Note (Signed)
    Page 1 of 2   11/01/2013     12:31:30 PM   CARE MANAGEMENT NOTE 11/01/2013  Patient:  Julie Kerr,Julie Kerr   Account Number:  [id number]  Date Initiated:  10/29/2013  Documentation initiated by:  [facility],SARAH  Subjective/Objective Assessment:   Admitted with SOB - briefly on bipap - almost intubated, however able to bipass that and now on 4 liters [place]. Steroids and IV antibiotics, IH.     Action/Plan:   pt rec - hhpt/lot  pt has medicaid, will not pay for pt/ot unless dx is fx or cva.   Anticipated DC Date:  11/01/2013   Anticipated DC Plan:  HOME W HOME HEALTH SERVICES      DC Planning Services  CM consult      [facility]   Choice offered to / List presented to:  C-1 Patient        HH arranged  HH-1 RN  HH-10 DISEASE MANAGEMENT      HH agency  [facility]   Status of service:  Completed, signed off Medicare Important Message given?   (If response is "NO", the following Medicare IM given date fields will be blank) Date Medicare IM given:   Date Additional Medicare IM given:    Discharge Disposition:  HOME W HOME HEALTH SERVICES  Per UR Regulation:  Reviewed for med. necessity/level of care/duration of stay  If discussed at Long Length of Stay Meetings, dates discussed:    Comments:  ContactLayla Barter:  Tillman,Larissa Sister [phone]  11/01/13 1228 Letha Capeeborah Carlise Stofer RN, BSN [phone] patient is for dc today, per physical therapy recs hhpt/ot, patient has medicaid and medicaid will not cover pt/ot if patient  does not have a dx of cva or fx.  Patient have HHRN for copd.  [facility] Marie notified. Soc will begin 24-48 hrs post discharge. Patient also stated she needs transportation, NCM informed CSW. Patient asked about ensure being covered by medicaid, NCM informed patient that ensure is over the counter product and medicaid does not cover this.  10/31/13 1221 Deboah Ladona Ridgelaylor RN, BSN [phone] patient is referred to Mr. Julien Girterkins  for Copd gold program. [facility] per previous NCM note.  10-29-13 10:15am Avie ArenasSarah Brown, RNBSN [phone] Lives with children - ages 5023, 6617 and 268.  Has assistance 7 days a week for 2.5 hours with Medicaid program.  States is not enough.  Walks with walker or cane due to past back surgeries.   Has not contacted her CW for a long time about increase in hours. She can do this by phone initially. Nods understanding as she rolls her eyes and states she is in a lot of pain and needs more pain meds for her back and legs. Nurse notified. Would like a nurse to come out and visit - not sure how long they would be needed, but also would probably benefit from SW to assist getting community contacts as it sound as if fairly new to [place].  has medicaid so recieves meds for 3 dollars per prescription.  Will need HH orders [facility] and [facility].

## 2013-10-29 NOTE — Progress Notes (Signed)
Pt has arrived on unit. Pt is resting calmly with eyes closed. No complaints at this time.

## 2013-10-29 NOTE — ED Notes (Signed)
Pt arousable, "feels better", speaking now with more than one word, intubation cancelled, airway and vocal cords visualized and patent, stridor no longer present, pt repeats, "I feel better", what did ya'll take out of me".

## 2013-10-29 NOTE — ED Notes (Signed)
No changes. Report called. Preparing to transport pt.

## 2013-10-29 NOTE — ED Notes (Addendum)
resps now shallow, short and noisy [facility] and [facility]

## 2013-10-29 NOTE — Progress Notes (Signed)
UR Completed.  Graciana Sessa Jane [phone] 10/29/2013  

## 2013-10-30 ENCOUNTER — Inpatient Hospital Stay (HOSPITAL_COMMUNITY): Payer: Medicaid Other

## 2013-10-30 LAB — CBC
HCT: 32.5 % — ABNORMAL LOW (ref 36.0–46.0)
Hemoglobin: 10.4 g/dL — ABNORMAL LOW (ref 12.0–15.0)
MCH: 24.9 pg — ABNORMAL LOW (ref 26.0–34.0)
MCHC: 32 g/dL (ref 30.0–36.0)
MCV: 77.8 fL — ABNORMAL LOW (ref 78.0–100.0)
Platelets: 392 10*3/uL (ref 150–400)
RBC: 4.18 MIL/uL (ref 3.87–5.11)
RDW: 18.2 % — ABNORMAL HIGH (ref 11.5–15.5)
WBC: 22.2 10*3/uL — ABNORMAL HIGH (ref 4.0–10.5)

## 2013-10-30 LAB — HIV ANTIBODY (ROUTINE TESTING W REFLEX): HIV: NONREACTIVE

## 2013-10-30 LAB — HEMOGLOBIN A1C
HEMOGLOBIN A1C: 5.6 % (ref <5.7)
MEAN PLASMA GLUCOSE: 114 mg/dL (ref <117)

## 2013-10-30 LAB — BASIC METABOLIC PANEL
BUN: 8 mg/dL (ref 6–23)
CO2: 19 meq/L (ref 19–32)
Calcium: 9.8 mg/dL (ref 8.4–10.5)
Chloride: 104 mEq/L (ref 96–112)
Creatinine, Ser: 0.75 mg/dL (ref 0.50–1.10)
GFR calc Af Amer: >90 mL/min (ref >90)
GLUCOSE: 205 mg/dL — AB (ref 70–99)
POTASSIUM: 4.5 meq/L (ref 3.7–5.3)
SODIUM: 140 meq/L (ref 137–147)

## 2013-10-30 LAB — GLUCOSE, CAPILLARY
GLUCOSE-CAPILLARY: 292 mg/dL — AB (ref 70–99)
GLUCOSE-CAPILLARY: 223 mg/dL — AB (ref 70–99)

## 2013-10-30 MED ORDER — MORPHINE SULFATE ER 30 MG PO TBCR
30.0000 mg | EXTENDED_RELEASE_TABLET | Freq: Two times a day (BID) | ORAL | Status: DC
  Administered 2013-10-30 – 2013-11-01 (×5): 30 mg via ORAL
  Filled 2013-10-30 (×5): qty 1

## 2013-10-30 MED ORDER — INSULIN ASPART 100 UNIT/ML ~~LOC~~ SOLN
0.0000 [IU] | Freq: Every day | SUBCUTANEOUS | Status: DC
  Administered 2013-10-30 – 2013-10-31 (×2): 2 [IU] via SUBCUTANEOUS

## 2013-10-30 MED ORDER — BENZONATATE 100 MG PO CAPS
100.0000 mg | ORAL_CAPSULE | Freq: Three times a day (TID) | ORAL | Status: DC
  Administered 2013-10-30 – 2013-11-01 (×8): 100 mg via ORAL
  Filled 2013-10-30 (×9): qty 1

## 2013-10-30 MED ORDER — METHYLPREDNISOLONE SODIUM SUCC 40 MG IJ SOLR
40.0000 mg | Freq: Three times a day (TID) | INTRAMUSCULAR | Status: DC
  Administered 2013-10-30 – 2013-11-01 (×7): 40 mg via INTRAVENOUS
  Filled 2013-10-30 (×9): qty 1

## 2013-10-30 MED ORDER — INSULIN ASPART 100 UNIT/ML ~~LOC~~ SOLN
0.0000 [IU] | Freq: Three times a day (TID) | SUBCUTANEOUS | Status: DC
  Administered 2013-10-30: 5 [IU] via SUBCUTANEOUS
  Administered 2013-10-31: 3 [IU] via SUBCUTANEOUS
  Administered 2013-10-31 (×2): 2 [IU] via SUBCUTANEOUS
  Administered 2013-11-01: 1 [IU] via SUBCUTANEOUS
  Administered 2013-11-01: 3 [IU] via SUBCUTANEOUS

## 2013-10-30 MED ORDER — CYCLOBENZAPRINE HCL 10 MG PO TABS
10.0000 mg | ORAL_TABLET | Freq: Three times a day (TID) | ORAL | Status: DC
  Administered 2013-10-30 – 2013-11-01 (×8): 10 mg via ORAL
  Filled 2013-10-30 (×9): qty 1

## 2013-10-30 MED ORDER — IPRATROPIUM-ALBUTEROL 0.5-2.5 (3) MG/3ML IN SOLN
3.0000 mL | RESPIRATORY_TRACT | Status: DC
  Administered 2013-10-30 – 2013-10-31 (×5): 3 mL via RESPIRATORY_TRACT
  Filled 2013-10-30 (×5): qty 3

## 2013-10-30 NOTE — Progress Notes (Signed)
Patient ID: Julie Kerr  female  AVW:[id number]    DOB: 1976/04/29    DOA: 10/29/2013  PCP: Lu Duffel, MD  Assessment/Plan: Active Problems:   Acute respiratory failure with asthma/COPD exacerbation still actively wheezing - Will continue scheduled bronchodilators, Levaquin, Pulmicort, flutter valve - Continue IV fluids, Singulair, Tessalon    Chronic pain syndrome - Patient reports chronic pain issues, states that she follows pain clinic (able to give me the name of the clinic or the physician's name). States that she is on "pain medications" chronically however urine drug screen was completely negative - continue long-acting oxycontin and short-acting oxycodone as needed, follow physical therapy evaluation  - Foot x-ray reviewed, no fracture or dislocation of the right ankle    Anemia - H&H stable    Vocal cord dysfunction   DVT Prophylaxis: Hopefully tomorrow  Code Status: Full code  Family Communication:  Disposition:  Consultants:  None  Procedures:  None  Antibiotics:  Zithromax    Subjective: Complaining of back pain, leg pain, knee pain, foot pain, wants more pain medications  Objective: Weight change: -0.9 kg (-1 lb 15.8 oz)  Intake/Output Summary (Last 24 hours) at 10/30/13 1338 Last data filed at 10/30/13 1000  Gross per 24 hour  Intake    563 ml  Output      0 ml  Net    563 ml   Blood pressure 116/74, pulse 87, temperature 98.4 F (36.9 C), temperature source Oral, resp. rate 20, height 5\' 2"  (1.575 m), weight 84.8 kg (186 lb 15.2 oz), last menstrual period 10/12/2013, SpO2 99.00%.  Physical Exam: General: Alert and awake, oriented x3, not in any acute distress. CVS: S1-S2 clear, no murmur rubs or gallops Chest: Diffuse wheezing bilaterally Abdomen: soft nontender, nondistended, normal bowel sounds  Extremities: no cyanosis, clubbing or edema noted bilaterally  Lab Results: Basic Metabolic Panel:  Recent Labs Lab 10/29/13 0220  10/30/13 0520  NA 138 140  K 3.9 4.5  CL 99 104  CO2 21 19  GLUCOSE 98 205*  BUN 12 8  CREATININE 1.06 0.75  CALCIUM 9.7 9.8   Liver Function Tests:  Recent Labs Lab 10/29/13 0220  AST 5  ALT 12  ALKPHOS 54  BILITOT <0.2*  PROT 7.6  ALBUMIN 4.0   No results found for this basename: LIPASE, AMYLASE,  in the last 168 hours No results found for this basename: AMMONIA,  in the last 168 hours CBC:  Recent Labs Lab 10/29/13 0220 10/30/13 0520  WBC 10.3 22.2*  NEUTROABS 5.8  --   HGB 11.5* 10.4*  HCT 34.1* 32.5*  MCV 76.6* 77.8*  PLT 429* 392   Cardiac Enzymes: No results found for this basename: CKTOTAL, CKMB, CKMBINDEX, TROPONINI,  in the last 168 hours BNP: No components found with this basename: POCBNP,  CBG: No results found for this basename: GLUCAP,  in the last 168 hours   Micro Results: Recent Results (from the past 240 hour(s))  MRSA PCR SCREENING     Status: None   Collection Time    10/29/13  6:52 AM      Result Value Ref Range Status   MRSA by PCR NEGATIVE  NEGATIVE Final   Comment:            The GeneXpert MRSA Assay (FDA     approved for NASAL specimens     only), is one component of a     comprehensive MRSA colonization     surveillance program. It  is not     intended to diagnose MRSA     infection nor to guide or     monitor treatment for     MRSA infections.    Studies/Results: Dg Hip Bilateral W/pelvis  10/29/2013   CLINICAL DATA:  Bilateral hip pain post fall  EXAM: BILATERAL HIP WITH PELVIS - 4+ VIEW  COMPARISON:  Pelvic radiograph 04/07/2013  FINDINGS: Prior L5-S1 fusion.  Osseous mineralization grossly normal for technique.  Hip and SI joints symmetric and preserved.  Poorly defined sclerosis identified within the femoral heads bilaterally consistent with prior avascular necrosis, unchanged from previous exam.  Femoral head morphology maintained.  No acute fracture, dislocation, or bone destruction.  IMPRESSION: Avascular necrosis of  the femoral heads bilaterally.  Prior L5-S1 fusion.  Otherwise negative exam.   Electronically Signed   By: Ulyses SouthwardMark  Boles M.D.   On: 10/29/2013 14:30   Dg Chest Port 1 View  10/29/2013   CLINICAL DATA:  Wheezing.  Respiratory distress.  EXAM: PORTABLE CHEST - 1 VIEW  COMPARISON:  Single view of the chest 09/06/2013 and 04/07/2013.  FINDINGS: Lungs are clear. Heart size is upper normal. No pneumothorax or pleural effusion.  IMPRESSION: No acute disease.   Electronically Signed   By: Drusilla Kannerhomas  Dalessio M.D.   On: 10/29/2013 02:33   Dg Foot 2 Views Right  10/30/2013   CLINICAL DATA:  Pain status post trauma  EXAM: RIGHT FOOT - 2 VIEW  COMPARISON:  None.  FINDINGS: There is no evidence of fracture or dislocation. There is no evidence of arthropathy or other focal bone abnormality. Soft tissues are unremarkable.  IMPRESSION: Negative.   Electronically Signed   By: Salome HolmesHector  Cooper M.D.   On: 10/30/2013 10:35    Medications: Scheduled Meds: . ALPRAZolam  1 mg Oral TID  . benzonatate  100 mg Oral TID  . budesonide (PULMICORT) nebulizer solution  0.25 mg Nebulization BID  . cyclobenzaprine  10 mg Oral TID  . enoxaparin (LOVENOX) injection  40 mg Subcutaneous Q24H  . feeding supplement (ENSURE COMPLETE)  237 mL Oral BID BM  . gabapentin  600 mg Oral TID  . insulin aspart  0-5 Units Subcutaneous QHS  . insulin aspart  0-9 Units Subcutaneous TID WC  . ipratropium-albuterol  3 mL Nebulization Q4H  . levofloxacin  500 mg Oral Daily  . methylPREDNISolone (SOLU-MEDROL) injection  40 mg Intravenous 3 times per day  . montelukast  10 mg Oral Daily  . morphine  30 mg Oral Q12H  . pantoprazole  40 mg Oral Daily  . sodium chloride  3 mL Intravenous Q12H  . sodium chloride  3 mL Intravenous Q12H  . zolpidem  5 mg Oral QHS      LOS: 1 day   RAI,RIPUDEEP M.D. [facility] 10/30/2013, 1:38 PM Pager: [phone]  If 7PM-7AM, please contact night-coverage [email or web address] Password [email or web address]

## 2013-10-30 NOTE — Progress Notes (Signed)
Pt is COPD Gold, MD paged to see if we can start COPD order sets. Awaiting orders. Will continue to monitor.

## 2013-10-30 NOTE — Progress Notes (Signed)
Nutrition Brief Note  Patient identified on the Malnutrition Screening Tool (MST) Report. Pt with stable weight and excellent appetite at this time.  Wt Readings from Last 15 Encounters:  10/30/13 186 lb 15.2 oz (84.8 kg)  09/11/13 194 lb 3.2 oz (88.089 kg)  09/03/13 178 lb 9.2 oz (81 kg)  08/10/13 178 lb 9.2 oz (81 kg)  08/05/13 181 lb 1.6 oz (82.146 kg)  06/17/13 186 lb 8.2 oz (84.6 kg)  05/04/13 186 lb (84.369 kg)  04/07/13 198 lb (89.812 kg)    Body mass index is 34.18 kg/(m^2). Patient meets criteria for Obese Class I based on current BMI.   Current diet order is Regual, patient is consuming approximately 100% of meals at this time. Labs and medications reviewed.   No nutrition interventions warranted at this time. If nutrition issues arise, please consult RD.   Jarold MottoSamantha Lashya Passe MS, RD, LDN Inpatient Registered Dietitian Pager: [phone] After-hours pager: [phone]

## 2013-10-30 NOTE — Progress Notes (Signed)
Inpatient Diabetes Program Recommendations  AACE/ADA: New Consensus Statement on Inpatient Glycemic Control (2013)  Target Ranges:  Prepandial:   less than 140 mg/dL      Peak postprandial:   less than 180 mg/dL (1-2 hours)      Critically ill patients:  140 - 180 mg/dL     Results for Julie Kerr, [name] (MRN [id number]) as of 10/30/2013 11:15  Ref. Range 10/30/2013 05:20  Glucose Latest Range: 70-99 mg/dL 956205 (H)     **Admitted with SOB/COPD.  **Pt currently receiving IV Solumedrol 40 mg Q8 hours.  Lab glucose elevated today.   **MD- Please consider checking pt's CBGs tid ac + HS and cover with Novolog Sensitive SSi if elevated  Will follow. [name] RN, MSN, CDE Diabetes Coordinator Inpatient Diabetes Program Team Pager: [phone] (8a-10p)'

## 2013-10-30 NOTE — Progress Notes (Signed)
Was the fall witnessed: Yes  Patient condition before and after the fall: Pt complaining of bilateral leg pain - MD paged - no new orders received. Prn med admin. Pt still complaining of bilateral leg pain after the fall.  Patient's reaction to the fall: Pt assisted back to bed and reassured that she was not in trouble for the fall. Pt agree with new safety plan of using BSC. Pt reminded of bed alarm and understand reasoning behind using it.    Name of the doctor that was notified including date and time: Craige CottaKirby [id number]  Any interventions and vital signs: Pt will use BSC from now on and will continue to use bed alarm. VS stable.

## 2013-10-31 LAB — CBC
HEMATOCRIT: 31.6 % — AB (ref 36.0–46.0)
Hemoglobin: 10.2 g/dL — ABNORMAL LOW (ref 12.0–15.0)
MCH: 25.8 pg — ABNORMAL LOW (ref 26.0–34.0)
MCHC: 32.3 g/dL (ref 30.0–36.0)
MCV: 79.8 fL (ref 78.0–100.0)
Platelets: 358 10*3/uL (ref 150–400)
RBC: 3.96 MIL/uL (ref 3.87–5.11)
RDW: 18.7 % — ABNORMAL HIGH (ref 11.5–15.5)
WBC: 17.5 10*3/uL — AB (ref 4.0–10.5)

## 2013-10-31 LAB — BASIC METABOLIC PANEL
BUN: 14 mg/dL (ref 6–23)
CO2: 24 mEq/L (ref 19–32)
Calcium: 9.9 mg/dL (ref 8.4–10.5)
Chloride: 97 mEq/L (ref 96–112)
Creatinine, Ser: 0.75 mg/dL (ref 0.50–1.10)
GFR calc Af Amer: >90 mL/min (ref >90)
GLUCOSE: 152 mg/dL — AB (ref 70–99)
POTASSIUM: 4.4 meq/L (ref 3.7–5.3)
SODIUM: 138 meq/L (ref 137–147)

## 2013-10-31 LAB — GLUCOSE, CAPILLARY
GLUCOSE-CAPILLARY: 179 mg/dL — AB (ref 70–99)
GLUCOSE-CAPILLARY: 203 mg/dL — AB (ref 70–99)
Glucose-Capillary: 190 mg/dL — ABNORMAL HIGH (ref 70–99)
Glucose-Capillary: 218 mg/dL — ABNORMAL HIGH (ref 70–99)

## 2013-10-31 LAB — CK: Total CK: 32 U/L (ref 7–177)

## 2013-10-31 MED ORDER — SENNOSIDES-DOCUSATE SODIUM 8.6-50 MG PO TABS
2.0000 | ORAL_TABLET | Freq: Once | ORAL | Status: AC
  Administered 2013-10-31: 2 via ORAL
  Filled 2013-10-31: qty 2

## 2013-10-31 MED ORDER — HYDROMORPHONE HCL PF 1 MG/ML IJ SOLN
0.5000 mg | INTRAMUSCULAR | Status: DC | PRN
  Administered 2013-10-31 – 2013-11-01 (×4): 0.5 mg via INTRAVENOUS
  Filled 2013-10-31 (×5): qty 1

## 2013-10-31 NOTE — Progress Notes (Signed)
Rt made pt nebs PRN only. Pt chest x-ray clear 10/29/13, sats 97% on ra, bs dim. Pt take mdi and nebs at home PRN per chart. Pt in no distress at this time.

## 2013-10-31 NOTE — Progress Notes (Signed)
Nutrition Brief Note  RD consulted for Nutrition Assessment via "COPD GOLD Protocol." Pt is currently eating 100% of meals and has had stable weight. Has Ensure Complete PO BID ordered.   RD provided handout regarding "General, Healthful Nutrition Therapy" during this visit. Pt denied any specific questions or concerns at this time.  Body mass index is 35.84 kg/(m^2). Patient meets criteria for Obese Class II based on current BMI.   Current diet order is Carbohydrate Modified Medium, patient is consuming approximately 100% of meals at this time. Labs and medications reviewed.   No nutrition interventions warranted at this time. If nutrition issues arise, please consult RD.   Jarold MottoSamantha Galilee Pierron MS, RD, LDN Inpatient Registered Dietitian Pager: [phone] After-hours pager: [phone]

## 2013-10-31 NOTE — Evaluation (Signed)
Physical Therapy Evaluation Patient Details Name: Julie Kerr MRN: [id number] DOB: 1975-10-25 Today's Date: 10/31/2013 Time: 7829-5621 PT Time Calculation (min): 31 min  PT Assessment / Plan / Recommendation History of Present Illness  38 y.o. female admitted to [facility] on 10/29/13 with acute repiratory distress.  She was able to avoid intubation this admission.  Dx with acute on chronic repiratory failure asthma/COPD exacerbation, anemia, and chronic pain syndropme.  This is her third admission for similar symptoms this year.  She has significant PMHx of COPD, asthma, anxiety, arthritis, with history of spinal fusion.    Clinical Impression  Pt is very unsteady on her feet and at high risk of falls as she gets weaker the further we walk.  She has some inconsistencies in her demonstrated functional abilities and when I tested her strength and she preformed leg exercises at the end of the session.  Based on her presentation, she would benefit from HHPT at discharge (she reports she would be unable to get to an OP PT center).  I am unsure of her coverage for home therapy, but she would benefit.   PT to follow acutely for deficits listed below.       PT Assessment  Patient needs continued PT services    Follow Up Recommendations  Home health PT;Supervision/Assistance - 24 hour    Does the patient have the potential to tolerate intense rehabilitation     NA  Barriers to Discharge   None      Equipment Recommendations  None recommended by PT    Recommendations for Other Services   None  Frequency Min 3X/week    Precautions / Restrictions Precautions Precautions: Fall Precaution Comments: h/o inpatient fall this admission.  Bil leg weak with gait and painful.  Required Braces or Orthoses: Other Brace/Splint Other Brace/Splint: Pt asking for ace wrap for her knee.     Pertinent Vitals/Pain See vitals flow sheet.      Mobility  Bed Mobility Overal bed mobility: Modified  Independent General bed mobility comments: Pt got up quickly and easily with the use of the bed rail.   Transfers Overall transfer level: Needs assistance Equipment used: Rolling walker (2 wheeled) Transfers: Sit to/from Stand Sit to Stand: Min assist General transfer comment: Pt slow to get to her feet.  Support needed at her trunk and she is using bil hands to push onto RW to stand.  Knees buckled at first, but she used her arms to support herself when going to stand.  Ambulation/Gait Ambulation/Gait assistance: Min assist Ambulation Distance (Feet): 100 Feet Assistive device: Rolling walker (2 wheeled) Gait Pattern/deviations: Step-through pattern;Shuffle;Trendelenburg Gait velocity: decreased Gait velocity interpretation: Below normal speed for age/gender General Gait Details: Pt demonstrates trendelenberg gait pattern on the left side and buckling in pre-swing in right leg (I would have expected it more in stance or terminal stace based on her strength).  As we got further into gait pt needed increased assistance for safety as her gait pattern worsened.      Exercises General Exercises - Upper Extremity Shoulder Flexion: AROM;Both;10 reps;Seated General Exercises - Lower Extremity Ankle Circles/Pumps: AROM;Both;10 reps;Seated Long Arc Quad: AROM;Both;10 reps;Seated Hip ABduction/ADduction: AROM;Both;10 reps;Seated (adduction only, against pillow for resistance) Hip Flexion/Marching: AROM;Both;10 reps;Seated   PT Diagnosis: Difficulty walking;Abnormality of gait;Generalized weakness;Acute pain  PT Problem List: Decreased strength;Decreased activity tolerance;Decreased balance;Decreased mobility;Decreased knowledge of use of DME;Decreased knowledge of precautions;Obesity;Pain PT Treatment Interventions: DME instruction;Gait training;Stair training;Functional mobility training;Therapeutic activities;Balance training;Therapeutic exercise;Patient/family education;Modalities  PT  Goals(Current goals can be found in the care plan section) Acute Rehab PT Goals Patient Stated Goal: to go home, get some therapy PT Goal Formulation: With patient Time For Goal Achievement: 11/14/13 Potential to Achieve Goals: Good  Visit Information  Last PT Received On: 10/31/13 Assistance Needed: +1 History of Present Illness: 38 y.o. female [facility] with acute repiratory distress.  She was able to avoid intubation this admission.  Dx with acute on chronic repiratory failure asthma/COPD exacerbation, anemia, and chronic pain syndropme.  This is her third admission for similar symptoms this year.  She has significant PMHx of COPD, asthma, anxiety, arthritis, with history of spinal fusion.         Prior Functioning  Home Living Family/patient expects to be discharged to:: Private residence Living Arrangements: Children Available Help at Discharge: Family;Friend(s);Personal care attendant Type of Home: House Home Access: Stairs to enter Entergy CorporationEntrance Stairs-Number of Steps: 3 Entrance Stairs-Rails: None Home Layout: One level Home Equipment: Walker - standard;Shower seat;Bedside commode Additional Comments: has hand held shower head Prior Function Level of Independence: Independent with assistive device(s) Comments: assist 7 days/wk with bathing, meal, dressing, home mgt Communication Communication: No difficulties Dominant Hand: Right    Cognition  Cognition Arousal/Alertness: Awake/alert Behavior During Therapy: WFL for tasks assessed/performed Overall Cognitive Status: Within Functional Limits for tasks assessed    Extremity/Trunk Assessment Upper Extremity Assessment Upper Extremity Assessment: Defer to OT evaluation Lower Extremity Assessment Lower Extremity Assessment: RLE deficits/detail;LLE deficits/detail RLE Deficits / Details: pt demonstrates with exercises ~3- to 3/5 strength in both legs.  Her gait pattern does not really match her demonstrated  strength during bed mobility and transfers as well as exercising at the end of the session.   LLE Deficits / Details: pt demonstrates with exercises ~3- to 3/5 strength in both legs.  Her gait pattern does not really match her demonstrated strength during bed mobility and transfers as well as exercising at the end of the session.   Cervical / Trunk Assessment Cervical / Trunk Assessment: Normal   Balance Balance Overall balance assessment: Needs assistance Sitting-balance support: Feet supported;No upper extremity supported Sitting balance-Leahy Scale: Normal Sitting balance - Comments: Pt easily got both of her socks on seated EOB by crossing her legs in a figure four pattern.  Most patients with severe back pain as she is reporting could not do this as easily.  She had good hip flexion with this activity, yet when asked to do it as an exercise later, she was bairely able to lift the knee due to reported pain and weakness.  This is an inconsistancy noted during her session.   Standing balance support: Bilateral upper extremity supported Standing balance-Leahy Scale: Poor  End of Session PT - End of Session Activity Tolerance: Patient limited by pain;Patient limited by fatigue Patient left: in chair;with call bell/phone within reach    [facility] HospitalRebecca B. Nikiya Starn, PT, DPT [phone]   10/31/2013, 5:50 PM

## 2013-10-31 NOTE — Progress Notes (Signed)
Patient ID: Julie Kerr  female  ZOX:[id number]    DOB: 1976-04-21    DOA: 10/29/2013  PCP: Lu Duffel, MD  Assessment/Plan: Active Problems:   Acute respiratory failure with asthma/COPD exacerbation: Wheezing is improving  - Will continue bronchodilators, Levaquin, Pulmicort, flutter valve - Continue Singulair, Tessalon. Continue IV steroids today, transition to oral prednisone in a.m.    Chronic pain syndrome - Patient reports chronic pain issues, states that she follows pain clinic (unable to give me the name of the clinic or the physician's name). States that she is on "pain medications" chronically however urine drug screen was completely negative - continue long-acting oxycontin and short-acting oxycodone as needed,  small dose of IV Dilaudid for the breakthrough pain, follow physical therapy evaluation  - Foot x-ray reviewed, no fracture or dislocation of the right ankle    Anemia - H&H stable    Vocal cord dysfunction   DVT Prophylaxis: Hopefully tomorrow to home  Code Status: Full code  Family Communication:  Disposition:  Consultants:  None  Procedures:  None  Antibiotics:  Zithromax    Subjective: Feeling somewhat better today, wheezing improving, still continues to complain of pain everywhere  Objective: Weight change: 4.1 kg (9 lb 0.6 oz)  Intake/Output Summary (Last 24 hours) at 10/31/13 1013 Last data filed at 10/30/13 1742  Gross per 24 hour  Intake    720 ml  Output    700 ml  Net     20 ml   Blood pressure 113/70, pulse 90, temperature 98.4 F (36.9 C), temperature source Oral, resp. rate 15, height 5\' 2"  (1.575 m), weight 88.9 kg (195 lb 15.8 oz), last menstrual period 10/12/2013, SpO2 97.00%.  Physical Exam: General: Alert and awake, oriented x3, NAD CVS: S1-S2 clear, no murmur rubs or gallops Chest:Mild scattered wheezing  Abdomen: soft nontender, nondistended, normal bowel sounds  Extremities: no cyanosis, clubbing or edema  noted bilaterally  Lab Results: Basic Metabolic Panel:  Recent Labs Lab 10/30/13 0520 10/31/13 0709  NA 140 138  K 4.5 4.4  CL 104 97  CO2 19 24  GLUCOSE 205* 152*  BUN 8 14  CREATININE 0.75 0.75  CALCIUM 9.8 9.9   Liver Function Tests:  Recent Labs Lab 10/29/13 0220  AST 5  ALT 12  ALKPHOS 54  BILITOT <0.2*  PROT 7.6  ALBUMIN 4.0   No results found for this basename: LIPASE, AMYLASE,  in the last 168 hours No results found for this basename: AMMONIA,  in the last 168 hours CBC:  Recent Labs Lab 10/29/13 0220 10/30/13 0520 10/31/13 0709  WBC 10.3 22.2* 17.5*  NEUTROABS 5.8  --   --   HGB 11.5* 10.4* 10.2*  HCT 34.1* 32.5* 31.6*  MCV 76.6* 77.8* 79.8  PLT 429* 392 358   Cardiac Enzymes: No results found for this basename: CKTOTAL, CKMB, CKMBINDEX, TROPONINI,  in the last 168 hours BNP: No components found with this basename: POCBNP,  CBG:  Recent Labs Lab 10/30/13 1706 10/30/13 2140 10/31/13 0909  GLUCAP 292* 223* 179*     Micro Results: Recent Results (from the past 240 hour(s))  MRSA PCR SCREENING     Status: None   Collection Time    10/29/13  6:52 AM      Result Value Ref Range Status   MRSA by PCR NEGATIVE  NEGATIVE Final   Comment:            The GeneXpert MRSA Assay (FDA  approved for NASAL specimens     only), is one component of a     comprehensive MRSA colonization     surveillance program. It is not     intended to diagnose MRSA     infection nor to guide or     monitor treatment for     MRSA infections.    Studies/Results: Dg Hip Bilateral W/pelvis  10/29/2013   CLINICAL DATA:  Bilateral hip pain post fall  EXAM: BILATERAL HIP WITH PELVIS - 4+ VIEW  COMPARISON:  Pelvic radiograph 04/07/2013  FINDINGS: Prior L5-S1 fusion.  Osseous mineralization grossly normal for technique.  Hip and SI joints symmetric and preserved.  Poorly defined sclerosis identified within the femoral heads bilaterally consistent with prior avascular  necrosis, unchanged from previous exam.  Femoral head morphology maintained.  No acute fracture, dislocation, or bone destruction.  IMPRESSION: Avascular necrosis of the femoral heads bilaterally.  Prior L5-S1 fusion.  Otherwise negative exam.   Electronically Signed   By: Ulyses SouthwardMark  Boles M.D.   On: 10/29/2013 14:30   Dg Chest Port 1 View  10/29/2013   CLINICAL DATA:  Wheezing.  Respiratory distress.  EXAM: PORTABLE CHEST - 1 VIEW  COMPARISON:  Single view of the chest 09/06/2013 and 04/07/2013.  FINDINGS: Lungs are clear. Heart size is upper normal. No pneumothorax or pleural effusion.  IMPRESSION: No acute disease.   Electronically Signed   By: Drusilla Kannerhomas  Dalessio M.D.   On: 10/29/2013 02:33   Dg Foot 2 Views Right  10/30/2013   CLINICAL DATA:  Pain status post trauma  EXAM: RIGHT FOOT - 2 VIEW  COMPARISON:  None.  FINDINGS: There is no evidence of fracture or dislocation. There is no evidence of arthropathy or other focal bone abnormality. Soft tissues are unremarkable.  IMPRESSION: Negative.   Electronically Signed   By: Salome HolmesHector  Cooper M.D.   On: 10/30/2013 10:35    Medications: Scheduled Meds: . ALPRAZolam  1 mg Oral TID  . benzonatate  100 mg Oral TID  . budesonide (PULMICORT) nebulizer solution  0.25 mg Nebulization BID  . cyclobenzaprine  10 mg Oral TID  . enoxaparin (LOVENOX) injection  40 mg Subcutaneous Q24H  . feeding supplement (ENSURE COMPLETE)  237 mL Oral BID BM  . gabapentin  600 mg Oral TID  . insulin aspart  0-5 Units Subcutaneous QHS  . insulin aspart  0-9 Units Subcutaneous TID WC  . levofloxacin  500 mg Oral Daily  . methylPREDNISolone (SOLU-MEDROL) injection  40 mg Intravenous 3 times per day  . montelukast  10 mg Oral Daily  . morphine  30 mg Oral Q12H  . pantoprazole  40 mg Oral Daily  . sodium chloride  3 mL Intravenous Q12H  . sodium chloride  3 mL Intravenous Q12H  . zolpidem  5 mg Oral QHS      LOS: 2 days   Dessie Delcarlo M.D. [facility] 10/31/2013,  10:13 AM Pager: [phone]  If 7PM-7AM, please contact night-coverage [email or web address] Password [email or web address]

## 2013-10-31 NOTE — Evaluation (Signed)
Occupational Therapy Evaluation Patient Details Name: Julie Kerr MRN: [id number] DOB: 06/16/1976 Today's Date: 10/31/2013 Time: [id number] OT Time Calculation (min): 42 min  OT Assessment / Plan / Recommendation History of present illness Pt is a 37y/o female admitted w/ primary issue seems to be refractory cough, chronic pain and anxiety .  Difficult to tell if there is any true bronchospasm at all. The primary contributing factors here are a) persistent nasal gtt b) worsening and poorly controlled reflux and c) some degree of anxiety/chronic pain. 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  High risk as multiple hospital admission with 3 previous intubation.  Will treat possible sinusitis and although not convinced this is asthma will cont systemic steroids and scheduled BDs. Patient is a 38 year old right-handed female who has a significant history of asthma with recent decompensation she tells me however that she's been troubled with back pain and bilateral leg pain since she had surgery in April 2014. This was performed [facility] by Dr. Mayford KnifeWilliams. She notes she had a surgery previously which included a fusion. She was told that she had broken screws in her fusion and this apparently was repaired. In November of 2014 she underwent a plain x-ray of her lumbar spine X. [place] and this reveals the presence of an interbody arthrodesis at L5-S1 with pedicle screw fixation and a broken screw tip in the S1 pedicle on the right. She notes that after the surgery she had bilateral leg pain and she did not have this before the surgery. This has not gotten any better. She states that she has not had a CT or MRI since her surgery. She states that she cannot afford or tolerate the trip to [place] for further followup. She has recently  moved to the [place] area.   Clinical Impression   Pt demos decline in function with ADLs and ADL mobility safety. Pt reported 9/10 pain in B LEs and back at start of session, pain meds given by her nurse and pain decreased to 7/10. Pt agreeable to work with OT stating, " I want to work with therapy so I can get home quicker". Pt has a personal care attendant at home 7 days/wk for 2-3 hrs that assist her with home mgt and ADLs prn depending on how much pain she is in per pt. Pt would benefit from acute OT services to address impairments to increase level of function and safety    OT Assessment  Patient needs continued OT Services    Follow Up Recommendations  Home health OT;Supervision - Intermittent    Barriers to Discharge   none  Equipment Recommendations  None recommended by OT    Recommendations for Other Services    Frequency  Min 2X/week    Precautions / Restrictions Precautions Precautions: Fall Precaution Comments: pt unsteady with intial sit - stand transitions, B LE and back pain  Restrictions Weight Bearing Restrictions: No   Pertinent Vitals/Pain 9/10 B LEs and back at start of eval, 7/10 after pain meds given    ADL  Grooming: Performed;Wash/dry hands;Wash/dry face;Min guard Where Assessed - Grooming: Supported standing Upper Body Bathing: Simulated;Set up;Supervision/safety Where Assessed - Upper Body Bathing: Unsupported sitting Lower Body Bathing: Performed;Minimal assistance Where Assessed - Lower Body Bathing: Supported standing Upper Body Dressing: Performed;Supervision/safety;Set up Where Assessed - Upper Body Dressing: Unsupported sitting  Lower Body Dressing: Performed;Minimal assistance Toilet Transfer: Performed;Minimal assistance Toilet Transfer Method: Sit to stand Toilet Transfer Equipment: Grab bars;Regular height toilet Toileting - Clothing Manipulation and Hygiene: Performed;Min guard Where Assessed - Glass blower/designer Manipulation and  Hygiene: Standing Tub/Shower Transfer Method: Not assessed Equipment Used: Rolling walker Transfers/Ambulation Related to ADLs: pt unsteady with intial sit - stand transitions, B LE and back pain  ADL Comments: Pt was able to stand at sink for part of ADLs, pt required increased time to complete ADLs due to reports of B LE and back pain. Meds given by nurse during session    OT Diagnosis: Generalized weakness;Acute pain  OT Problem List: Decreased range of motion;Pain;Impaired balance (sitting and/or standing);Decreased activity tolerance;Decreased strength OT Treatment Interventions: Self-care/ADL training;Therapeutic exercise;Patient/family education;Neuromuscular education;Balance training;Therapeutic activities;DME and/or AE instruction   OT Goals(Current goals can be found in the care plan section) Acute Rehab OT Goals Patient Stated Goal: "to work with therapy and do well so I can get home sooner" OT Goal Formulation: With patient Time For Goal Achievement: 11/07/13 Potential to Achieve Goals: Good ADL Goals Pt Will Perform Grooming: with supervision;with set-up;standing Pt Will Perform Lower Body Bathing: with supervision;with set-up;sitting/lateral leans;sit to/from stand;with min guard assist Pt Will Perform Lower Body Dressing: with min guard assist;with supervision;with set-up;sitting/lateral leans;sit to/from stand Pt Will Transfer to Toilet: with min guard assist;with supervision;grab bars;regular height toilet Pt Will Perform Toileting - Clothing Manipulation and hygiene: with supervision;sitting/lateral leans;sit to/from stand Pt Will Perform Tub/Shower Transfer: with min guard assist;with supervision;shower seat  Visit Information  Last OT Received On: 10/31/13 Assistance Needed: +1 History of Present Illness: Pt is a 37y/o female admitted w/ primary issue seems to be refractory cough, chronic pain and anxiety .  Difficult to tell if there is any true bronchospasm at all.  The primary contributing factors here are a) persistent nasal gtt b) worsening and poorly controlled reflux and c) some degree of anxiety/chronic pain. 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  High risk as multiple hospital admission with 3 previous intubation.  Will treat possible sinusitis and although not convinced this is asthma will cont systemic steroids and scheduled BDs. Patient is a 38 year old right-handed female who has a significant history of asthma with recent decompensation she tells me however that she's been troubled with back pain and bilateral leg pain since she had surgery in April 2014. This was performed in [facility] by Dr. Mayford Knife. She notes she had a surgery previously which included a fusion. She was told that she had broken screws in her fusion and this apparently was repaired. In November of 2014 she underwent a plain x-ray of her lumbar spine X. [place] and this reveals the presence of an interbody arthrodesis at L5-S1 with pedicle screw fixation and a broken screw tip in the S1 pedicle on the right. She notes that after the surgery she had bilateral leg pain and she did not have this before the surgery. This has not gotten any better. She states that she has not had a CT or MRI since her surgery. She states that she cannot afford or tolerate the trip to [place] for further followup. She has recently moved to the [place] area.       Prior Functioning     Home Living Family/patient expects to be discharged to:: Private residence  Living Arrangements: Children Available Help at Discharge: Family;Friend(s);Personal care attendant Type of Home: House Home Access: Stairs to enter [employer] of Steps: 3 Entrance Stairs-Rails: None Home Layout: One level Home Equipment: Walker -  standard;Shower seat;Bedside commode Additional Comments: has hand held shower head Prior Function Level of Independence: Independent with assistive device(s) Comments: assist 7 days/wk with bathing, meal, dressing, home mgt Communication Communication: No difficulties Dominant Hand: Right         Vision/Perception Vision - History Patient Visual Report: No change from baseline   Cognition  Cognition Arousal/Alertness: Awake/alert Behavior During Therapy: WFL for tasks assessed/performed Overall Cognitive Status: Within Functional Limits for tasks assessed    Extremity/Trunk Assessment Upper Extremity Assessment Upper Extremity Assessment: Generalized weakness;RUE deficits/detail;LUE deficits/detail RUE Deficits / Details: unable to complete full AROM due to pain in shoulders at 90 degrees flexion. 3/5 grossly RUE: Unable to fully assess due to pain LUE Deficits / Details: unable to complete full AROM due to pain in shoulders at 90 degrees flexion. 3/5 grossly LUE: Unable to fully assess due to pain     Mobility Bed Mobility Overal bed mobility: Needs Assistance Bed Mobility: Supine to Sit Supine to sit: Supervision Transfers Overall transfer level: Needs assistance Equipment used: Rolling walker (2 wheeled) Transfers: Sit to/from Stand Sit to Stand: Min assist General transfer comment: pt unsteady with intial sit - stand transitions, B LE and back pain           Balance Balance Overall balance assessment: Needs assistance Sitting-balance support: No upper extremity supported;Feet supported Sitting balance-Leahy Scale: Good Standing balance support: During functional activity;Single extremity supported;Bilateral upper extremity supported Standing balance-Leahy Scale: Fair   End of Session OT - End of Session Equipment Utilized During Treatment: Rolling walker Activity Tolerance: Patient limited by pain Patient left: in bed;Other (comment) (sittnig EOB wiht nurse  tech)  GO     Galen Manila 10/31/2013, 1:35 PM

## 2013-11-01 LAB — GLUCOSE, CAPILLARY
GLUCOSE-CAPILLARY: 147 mg/dL — AB (ref 70–99)
Glucose-Capillary: 201 mg/dL — ABNORMAL HIGH (ref 70–99)

## 2013-11-01 MED ORDER — WHITE PETROLATUM GEL
Status: AC
  Administered 2013-11-01: 0.2
  Filled 2013-11-01: qty 5

## 2013-11-01 MED ORDER — HYDROCOD POLST-CHLORPHEN POLST 10-8 MG/5ML PO LQCR: 5.0000 mL | Freq: Two times a day (BID) | ORAL | Status: DC | PRN

## 2013-11-01 MED ORDER — ALPRAZOLAM 1 MG PO TABS: 1.0000 mg | ORAL_TABLET | Freq: Three times a day (TID) | ORAL | Status: DC

## 2013-11-01 MED ORDER — PREDNISONE 50 MG PO TABS
60.0000 mg | ORAL_TABLET | Freq: Every day | ORAL | Status: DC
  Administered 2013-11-01: 60 mg via ORAL
  Filled 2013-11-01 (×2): qty 1

## 2013-11-01 MED ORDER — PREDNISONE 10 MG PO TABS: ORAL_TABLET | ORAL | Status: DC

## 2013-11-01 MED ORDER — IPRATROPIUM-ALBUTEROL 0.5-2.5 (3) MG/3ML IN SOLN: 3.0000 mL | Freq: Two times a day (BID) | RESPIRATORY_TRACT | Status: DC

## 2013-11-01 MED ORDER — LEVOFLOXACIN 500 MG PO TABS: 500.0000 mg | ORAL_TABLET | Freq: Every day | ORAL | Status: DC

## 2013-11-01 MED ORDER — ALBUTEROL SULFATE (5 MG/ML) 0.5% IN NEBU: 2.5000 mg | INHALATION_SOLUTION | Freq: Four times a day (QID) | RESPIRATORY_TRACT | Status: DC | PRN

## 2013-11-01 MED ORDER — OXYCODONE HCL 15 MG PO TABS: 15.0000 mg | ORAL_TABLET | ORAL | Status: DC | PRN

## 2013-11-01 MED ORDER — MORPHINE SULFATE ER 30 MG PO TBCR: 30.0000 mg | EXTENDED_RELEASE_TABLET | Freq: Two times a day (BID) | ORAL | Status: DC

## 2013-11-01 MED ORDER — IPRATROPIUM-ALBUTEROL 0.5-2.5 (3) MG/3ML IN SOLN
3.0000 mL | Freq: Four times a day (QID) | RESPIRATORY_TRACT | Status: DC
  Administered 2013-11-01: 3 mL via RESPIRATORY_TRACT
  Filled 2013-11-01: qty 3

## 2013-11-01 MED ORDER — ALBUTEROL SULFATE (2.5 MG/3ML) 0.083% IN NEBU: 2.5000 mg | INHALATION_SOLUTION | RESPIRATORY_TRACT | Status: DC | PRN

## 2013-11-01 MED ORDER — BUDESONIDE 0.25 MG/2ML IN SUSP: 0.2500 mg | Freq: Two times a day (BID) | RESPIRATORY_TRACT | Status: DC

## 2013-11-01 MED ORDER — BENZONATATE 100 MG PO CAPS: 100.0000 mg | ORAL_CAPSULE | Freq: Three times a day (TID) | ORAL | Status: DC | PRN

## 2013-11-01 NOTE — Progress Notes (Signed)
Physical Therapy Treatment Patient Details Name: Julie Kerr MRN: [id number] DOB: 12/01/1975 Today's Date: 11/01/2013 Time: [id number] PT Time Calculation (min): 35 min  PT Assessment / Plan / Recommendation  History of Present Illness 38 y.o. female [facility] with acute repiratory distress.  She was able to avoid intubation this admission.  Dx with acute on chronic repiratory failure asthma/COPD exacerbation, anemia, and chronic pain syndropme.  This is her third admission for similar symptoms this year.  She has significant PMHx of COPD, asthma, anxiety, arthritis, with history of spinal fusion.     PT Comments   Patient able to negotiate stairs for home entry with assist; states she will have assist.  Also educated in HEP for home for core and LE strength.  Likely not to have follow up PT due to no coverage.  Follow Up Recommendations  Home health PT;No PT follow up (depending on insurance coverage)           Equipment Recommendations  None recommended by PT    Recommendations for Other Services  None  Frequency Min 3X/week   Progress towards PT Goals Progress towards PT goals: Progressing toward goals  Plan Current plan remains appropriate    Precautions / Restrictions Precautions Precautions: Fall Precaution Comments: h/o inpatient fall this admission.  Bil leg weak with gait and painful.    Pertinent Vitals/Pain C/o back pain, RN aware   Mobility  Bed Mobility General bed mobility comments: NT, pt in chair Transfers Overall transfer level: Modified independent Equipment used: Rolling walker (2 wheeled) Sit to Stand: Modified independent (Device/Increase time) General transfer comment: increased time needed Ambulation/Gait Ambulation/Gait assistance: Min guard;Supervision Ambulation Distance (Feet): 225 Feet Assistive device: Rolling walker (2 wheeled) Gait Pattern/deviations: Shuffle;Step-through pattern;Step-to pattern General Gait Details:  encouraged decreased lumbar lordosis and increased upright posture with improvements couple of times during ambulation Stairs: Yes Stairs assistance: Min assist Stair Management: No rails;Step to pattern;Forwards Number of Stairs: 3 General stair comments: one heand held assist to ascend, pt holding only my arms as I held gait belt on the way down; cues for sequence    Exercises Other Exercises Other Exercises: Instructed in core strengthening exercises including pelvic tilts and transverse abdominis activation demonstrated hooklying, sitting and standing with wall for support; Other Exercises: Issued for HEP hooklying pelvic tilts with transverse abdominis activation; tilts with march; tilts with bridging; wall slides and standing pelvic tilts.     PT Goals (current goals can now be found in the care plan section)    Visit Information  Last PT Received On: 11/01/13 Assistance Needed: +1 History of Present Illness: 38 y.o. female [facility] with acute repiratory distress.  She was able to avoid intubation this admission.  Dx with acute on chronic repiratory failure asthma/COPD exacerbation, anemia, and chronic pain syndropme.  This is her third admission for similar symptoms this year.  She has significant PMHx of COPD, asthma, anxiety, arthritis, with history of spinal fusion.      Subjective Data      Cognition  Cognition Arousal/Alertness: Awake/alert Behavior During Therapy: WFL for tasks assessed/performed Overall Cognitive Status: Within Functional Limits for tasks assessed    Balance  Balance Standing balance-Leahy Scale: Fair  End of Session PT - End of Session Equipment Utilized During Treatment: Gait belt Activity Tolerance: Patient tolerated treatment well Patient left: in chair;with call bell/phone within reach   GP     [facility],CYNDI 11/01/2013, 4:53 PM Sheran Lawlessyndi Melecio Cueto, PT [phone]  11/01/2013   
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
## 2013-11-01 NOTE — Progress Notes (Signed)
Occupational Therapy Treatment Patient Details Name: Mikaylee Arseneau MRN: [id number] DOB: [birth year] Today's Date: 11/01/2013 Time: 4098-1191 OT Time Calculation (min): 33 min  OT Assessment / Plan / Recommendation  History of present illness 38 y.o. female admitted to [facility] on 10/29/13 with acute repiratory distress.  She was able to avoid intubation this admission.  Dx with acute on chronic repiratory failure asthma/COPD exacerbation, anemia, and chronic pain syndrome.  This is her third admission for similar symptoms this year.  She has significant PMHx of COPD, asthma, anxiety, arthritis, with history of spinal fusion.     OT comments  Pt participated in ADL retraining session this am w/ focus on UB/LB bathe/dress standing and sitting at sink level as well as functional mobility and transfers. Pt reports that she may d/c home later today, recommend HHOT and PRN supervision/assist for safety w/ ADL's & homemaking tasks.   Follow Up Recommendations  Home health OT;Supervision - Intermittent    Barriers to Discharge       Equipment Recommendations  None recommended by OT    Recommendations for Other Services    Frequency Min 2X/week   Progress towards OT Goals Progress towards OT goals: Progressing toward goals  Plan Discharge plan remains appropriate    Precautions / Restrictions Precautions Precautions: Fall Precaution Comments: h/o inpatient fall this admission.  Bil leg weak with gait and painful.  Restrictions Weight Bearing Restrictions: No   Pertinent Vitals/Pain 7/10 low back and leg pain. RN aware and gave medication for pain prior to OT session.    ADL  Grooming: Performed;Wash/dry hands;Wash/dry face;Teeth care;Supervision/safety;Min guard Where Assessed - Grooming: Supported standing;Unsupported standing Upper Body Bathing: Performed;Chest;Right arm;Left arm;Abdomen;Supervision/safety;Min guard Where Assessed - Upper Body Bathing: Unsupported standing;Supported  standing Lower Body Bathing: Performed;Supervision/safety Where Assessed - Lower Body Bathing: Unsupported sitting Upper Body Dressing: Performed;Supervision/safety;Set up Where Assessed - Upper Body Dressing: Unsupported sitting Lower Body Dressing: Performed;Min guard;Supervision/safety Toilet Transfer: Min guard;Performed Toilet Transfer Method: Sit to Barista: Raised toilet seat with arms (or 3-in-1 over toilet) Toileting - Clothing Manipulation and Hygiene: Performed;Supervision/safety;Min guard Where Assessed - Glass blower/designer Manipulation and Hygiene: Standing;Sit to stand from 3-in-1 or toilet Tub/Shower Transfer Method: Not assessed Equipment Used: Rolling walker Transfers/Ambulation Related to ADLs: pt unsteady with intial sit - stand transitions, B LE and back pain  ADL Comments: Pt stood at sink for UB ADL retraining session today and grooming. Pt educated on rest breaks PRN due to SOB and for safety w/ LB dressing. Pt completed ADL's w/ sit to stand at sink level. MD requesting PT later this afternoon for possible d/c home this evening. Recommend HHOT secondary to decreased endurance, fatigue and pain, MD made aware of this. Cont to recommend supervision/assist PRN as well.     OT Diagnosis:    OT Problem List:   OT Treatment Interventions:     OT Goals(current goals can now be found in the care plan section) Acute Rehab OT Goals Patient Stated Goal: to go home, get some therapy  Visit Information  Last OT Received On: 11/01/13 Assistance Needed: +1 History of Present Illness: 38 y.o. female admitted to [facility] on 10/29/13 with acute repiratory distress.  She was able to avoid intubation this admission.  Dx with acute on chronic repiratory failure asthma/COPD exacerbation, anemia, and chronic pain syndropme.  This is her third admission for similar symptoms this year.  She has significant PMHx of COPD, asthma, anxiety, arthritis, with history of spinal  fusion.  Subjective Data      Prior Functioning       Cognition  Cognition Arousal/Alertness: Awake/alert Behavior During Therapy: WFL for tasks assessed/performed Overall Cognitive Status: Within Functional Limits for tasks assessed    Mobility  Bed Mobility Overal bed mobility: Modified Independent Bed Mobility: Supine to Sit Supine to sit: Modified independent (Device/Increase time) General bed mobility comments: Use of the bed rail.   Transfers Overall transfer level: Needs assistance Equipment used: Rolling walker (2 wheeled) Transfers: Sit to/from Stand Sit to Stand: Supervision;Min guard         Balance Balance Overall balance assessment: Needs assistance Sitting-balance support: Feet supported Sitting balance-Leahy Scale: Normal Sitting balance - Comments: Pt was noted to perform LB dressing this am seated at sink for donning underwear and socks w/o noted difficulty, however pt c/o low back & leg pain throughout session overall. RN aware and had given pain medication prior to this treatment session. Standing balance support: No upper extremity supported;During functional activity Standing balance-Leahy Scale: Fair  End of Session OT - End of Session Equipment Utilized During Treatment: Rolling walker Activity Tolerance: Patient limited by fatigue;Patient limited by pain Patient left: in chair;with call bell/phone within reach Nurse Communication: Mobility status  GO     Alm BustardBarnhill, Amy Beth Dixon 11/01/2013, 9:25 AM

## 2013-11-01 NOTE — Clinical Social Work Note (Signed)
Taxi voucher provided to [employer].

## 2013-11-01 NOTE — Discharge Summary (Signed)
Physician Discharge Summary  Patient ID: Roux Brandy MRN: [id number] DOB/AGE: 1976/04/06 38 y.o.  Admit date: 10/29/2013 Discharge date: 11/01/2013  Primary Care Physician:  Lu Duffel, MD  Discharge Diagnoses:    . Acute respiratory failure secondary to COPD exacerbation  . Vocal cord dysfunction . Asthma exacerbation . Anemia . Chronic pain syndrome  Consults: None   Recommendations for Outpatient Follow-up:  Please follow with the pain management clinic asap  Allergies:   Allergies  Allergen Reactions  . Robitussin Dm [Dextromethorphan-Guaifenesin] Nausea And Vomiting  . Nsaids Hives  . Tramadol Hives     Discharge Medications:   Medication List         albuterol 108 (90 BASE) MCG/ACT inhaler  Commonly known as:  PROVENTIL HFA;VENTOLIN HFA  Inhale 2 puffs into the lungs every 4 (four) hours as needed for wheezing.     albuterol (5 MG/ML) 0.5% nebulizer solution  Commonly known as:  PROVENTIL  Take 0.5 mLs (2.5 mg total) by nebulization every 6 (six) hours as needed for wheezing or shortness of breath.     ALPRAZolam 1 MG tablet  Commonly known as:  XANAX  Take 1 tablet (1 mg total) by mouth 3 (three) times daily.     benzonatate 100 MG capsule  Commonly known as:  TESSALON  Take 1 capsule (100 mg total) by mouth 3 (three) times daily as needed for cough.     budesonide 0.25 MG/2ML nebulizer solution  Commonly known as:  PULMICORT  Take 2 mLs (0.25 mg total) by nebulization 2 (two) times daily.     chlorpheniramine-HYDROcodone 10-8 MG/5ML Lqcr  Commonly known as:  TUSSIONEX  Take 5 mLs by mouth every 12 (twelve) hours as needed for cough.     cyclobenzaprine 10 MG tablet  Commonly known as:  FLEXERIL  Take 10 mg by mouth 3 (three) times daily as needed for muscle spasms.     gabapentin 300 MG capsule  Commonly known as:  NEURONTIN  Take 2 capsules (600 mg total) by mouth 3 (three) times daily.     levofloxacin 500 MG tablet  Commonly known  as:  LEVAQUIN  Take 1 tablet (500 mg total) by mouth daily. x4 more days     morphine 30 MG 12 hr tablet  Commonly known as:  MS CONTIN  Take 1 tablet (30 mg total) by mouth every 12 (twelve) hours. NO refills, Refill only by primary MD     oxyCODONE 15 MG immediate release tablet  Commonly known as:  ROXICODONE  Take 1 tablet (15 mg total) by mouth every 4 (four) hours as needed for pain. NO refill, refills only by primary MD     pantoprazole 40 MG tablet  Commonly known as:  PROTONIX  Take 1 tablet (40 mg total) by mouth daily.     predniSONE 10 MG tablet  Commonly known as:  DELTASONE  - Prednisone dosing: Take  Prednisone 40mg  (4 tabs) x 3 days, then taper to 30mg  (3 tabs) x 3 days, then 20mg  (2 tabs) x 3days, then 10mg  (1 tab) x 3days, then OFF.  -   - Dispense:  30 tabs, refills: None  Start taking on:  11/02/2013     SINGULAIR 10 MG tablet  Generic drug:  montelukast  Take 10 mg by mouth daily.     zolpidem 10 MG tablet  Commonly known as:  AMBIEN  Take 10 mg by mouth at bedtime.         Brief H  and P: For complete details please refer to admission H and P, but in brief Adele Schildermanda Salek is a 38 y.o. female with history of asthma/COPD/vocal cord dysfunction, chronic pain presents to the ER because of shortness of breath and wheezing. Patient states her symptoms started off yesterday. Denies any fever chills. Has been having productive cough. Has pleuritic-type of chest pain. In the ER patient was found to be acutely short of breath and was about to be intubated by ER physician. Intubation was later aborted by the ER physician. Since patient is still wheezing at this time patient has been admitted for further management.    Hospital Course:   Acute respiratory failure with asthma/COPD exacerbation: Wheezing is improving: Patient was placed on scheduled bronchodilators, Levaquin, Pulmicort and flutter valve. She has improved significantly. Patient was transitioned to oral  prednisone. She has nebulizer machine at home and was recommended to continue scheduled bronchodilators 3 times a day until she follows up with her PCP on 11/04/2013.   Chronic pain syndrome  - Patient reports chronic pain issues, states that she follows pain clinic (unable to give me the name of the clinic or the physician's name). States that she is on "pain medications" chronically however urine drug screen was completely negative. Patient did have pain issues during the physical therapy sessions. She will have home health RN setup at home. Foot x-ray reviewed, no fracture or dislocation of the right ankle . Patient was encouraged to follow up with pain medication clinic.  Anemia  - H&H stable  Hyperglycemia: Due to steroids, hemoglobin A1c is 5.6. Likely will improve with taper of prednisone   Day of Discharge BP 112/71  Pulse 90  Temp(Src) 97.8 F (36.6 C) (Oral)  Resp 17  Ht 5\' 2"  (1.575 m)  Wt 88 kg (194 lb 0.1 oz)  BMI 35.47 kg/m2  SpO2 97%  LMP 10/12/2013  Physical Exam: General: Alert and awake oriented x3 not in any acute distress. CVS: S1-S2 clear no murmur rubs or gallops Chest: clear to auscultation bilaterally, no wheezing rales or rhonchi Abdomen: soft nontender, nondistended, normal bowel sounds Extremities: no cyanosis, clubbing or edema noted bilaterally Neuro: Cranial nerves II-XII intact, no focal neurological deficits   The results of significant diagnostics from this hospitalization (including imaging, microbiology, ancillary and laboratory) are listed below for reference.    LAB RESULTS: Basic Metabolic Panel:  Recent Labs Lab 10/30/13 0520 10/31/13 0709  NA 140 138  K 4.5 4.4  CL 104 97  CO2 19 24  GLUCOSE 205* 152*  BUN 8 14  CREATININE 0.75 0.75  CALCIUM 9.8 9.9   Liver Function Tests:  Recent Labs Lab 10/29/13 0220  AST 5  ALT 12  ALKPHOS 54  BILITOT <0.2*  PROT 7.6  ALBUMIN 4.0   No results found for this basename: LIPASE,  AMYLASE,  in the last 168 hours No results found for this basename: AMMONIA,  in the last 168 hours CBC:  Recent Labs Lab 10/29/13 0220 10/30/13 0520 10/31/13 0709  WBC 10.3 22.2* 17.5*  NEUTROABS 5.8  --   --   HGB 11.5* 10.4* 10.2*  HCT 34.1* 32.5* 31.6*  MCV 76.6* 77.8* 79.8  PLT 429* 392 358   Cardiac Enzymes:  Recent Labs Lab 10/31/13 0917  CKTOTAL 32   BNP: No components found with this basename: POCBNP,  CBG:  Recent Labs Lab 10/31/13 2114 11/01/13 0759  GLUCAP 203* 147*    Significant Diagnostic Studies:  Dg Hip Bilateral W/pelvis  10/29/2013   CLINICAL DATA:  Bilateral hip pain post fall  EXAM: BILATERAL HIP WITH PELVIS - 4+ VIEW  COMPARISON:  Pelvic radiograph 04/07/2013  FINDINGS: Prior L5-S1 fusion.  Osseous mineralization grossly normal for technique.  Hip and SI joints symmetric and preserved.  Poorly defined sclerosis identified within the femoral heads bilaterally consistent with prior avascular necrosis, unchanged from previous exam.  Femoral head morphology maintained.  No acute fracture, dislocation, or bone destruction.  IMPRESSION: Avascular necrosis of the femoral heads bilaterally.  Prior L5-S1 fusion.  Otherwise negative exam.   Electronically Signed   By: Ulyses Southward M.D.   On: 10/29/2013 14:30   Dg Chest Port 1 View  10/29/2013   CLINICAL DATA:  Wheezing.  Respiratory distress.  EXAM: PORTABLE CHEST - 1 VIEW  COMPARISON:  Single view of the chest 09/06/2013 and 04/07/2013.  FINDINGS: Lungs are clear. Heart size is upper normal. No pneumothorax or pleural effusion.  IMPRESSION: No acute disease.   Electronically Signed   By: Drusilla Kanner M.D.   On: 10/29/2013 02:33   Dg Foot 2 Views Right  10/30/2013   CLINICAL DATA:  Pain status post trauma  EXAM: RIGHT FOOT - 2 VIEW  COMPARISON:  None.  FINDINGS: There is no evidence of fracture or dislocation. There is no evidence of arthropathy or other focal bone abnormality. Soft tissues are unremarkable.   IMPRESSION: Negative.   Electronically Signed   By: Salome Holmes M.D.   On: 10/30/2013 10:35      Disposition and Follow-up:     Discharge Orders   Future Orders Complete By Expires   Diet Carb Modified  As directed    Discharge instructions  As directed    Comments:     Please continue Nebulizer breathing treatments three times a day scheduled (and every 4 hours if needed) until you see your primary MD on 11/04/13.   Increase activity slowly  As directed        DISPOSITION: Home DIET: Carb modified diet    DISCHARGE FOLLOW-UP Follow-up Information   Follow up with Lu Duffel, MD On 11/04/2013. (at 1:30PM, for hospital follow-up)    Specialty:  Family Medicine      Time spent on Discharge: 45 mins  Signed:   Mckale Haffey M.D. [facility] 11/01/2013, 9:19 AM Pager: [phone]

## 2013-11-25 ENCOUNTER — Inpatient Hospital Stay (HOSPITAL_COMMUNITY)
Admission: EM | Admit: 2013-11-25 | Discharge: 2013-11-28 | DRG: 193 | Disposition: A | Discharge status: Home / Self Care | Payer: Medicaid Other | Attending: Internal Medicine | Admitting: Internal Medicine

## 2013-11-25 ENCOUNTER — Encounter (HOSPITAL_COMMUNITY): Payer: Self-pay | Admitting: Emergency Medicine

## 2013-11-25 ENCOUNTER — Emergency Department (HOSPITAL_COMMUNITY): Payer: Medicaid Other

## 2013-11-25 DIAGNOSIS — J441 Chronic obstructive pulmonary disease with (acute) exacerbation: Secondary | ICD-10-CM

## 2013-11-25 DIAGNOSIS — J189 Pneumonia, unspecified organism: Secondary | ICD-10-CM

## 2013-11-25 DIAGNOSIS — G894 Chronic pain syndrome: Secondary | ICD-10-CM

## 2013-11-25 DIAGNOSIS — F410 Panic disorder [episodic paroxysmal anxiety] without agoraphobia: Secondary | ICD-10-CM | POA: Diagnosis present

## 2013-11-25 DIAGNOSIS — J383 Other diseases of vocal cords: Secondary | ICD-10-CM | POA: Diagnosis present

## 2013-11-25 DIAGNOSIS — R0602 Shortness of breath: Secondary | ICD-10-CM

## 2013-11-25 DIAGNOSIS — D649 Anemia, unspecified: Secondary | ICD-10-CM | POA: Diagnosis present

## 2013-11-25 DIAGNOSIS — R0682 Tachypnea, not elsewhere classified: Secondary | ICD-10-CM | POA: Diagnosis present

## 2013-11-25 DIAGNOSIS — J9601 Acute respiratory failure with hypoxia: Secondary | ICD-10-CM | POA: Diagnosis present

## 2013-11-25 DIAGNOSIS — R061 Stridor: Secondary | ICD-10-CM | POA: Diagnosis present

## 2013-11-25 DIAGNOSIS — Z79899 Other long term (current) drug therapy: Secondary | ICD-10-CM

## 2013-11-25 DIAGNOSIS — Z87891 Personal history of nicotine dependence: Secondary | ICD-10-CM

## 2013-11-25 DIAGNOSIS — Z981 Arthrodesis status: Secondary | ICD-10-CM

## 2013-11-25 DIAGNOSIS — J45909 Unspecified asthma, uncomplicated: Secondary | ICD-10-CM

## 2013-11-25 DIAGNOSIS — Z888 Allergy status to other drugs, medicaments and biological substances status: Secondary | ICD-10-CM

## 2013-11-25 DIAGNOSIS — F411 Generalized anxiety disorder: Secondary | ICD-10-CM | POA: Diagnosis present

## 2013-11-25 DIAGNOSIS — J45901 Unspecified asthma with (acute) exacerbation: Secondary | ICD-10-CM

## 2013-11-25 DIAGNOSIS — J9600 Acute respiratory failure, unspecified whether with hypoxia or hypercapnia: Secondary | ICD-10-CM

## 2013-11-25 DIAGNOSIS — R946 Abnormal results of thyroid function studies: Secondary | ICD-10-CM | POA: Diagnosis present

## 2013-11-25 DIAGNOSIS — R000 Tachycardia, unspecified: Secondary | ICD-10-CM | POA: Diagnosis present

## 2013-11-25 LAB — CBC
HCT: 35.8 % — ABNORMAL LOW (ref 36.0–46.0)
HEMOGLOBIN: 11.5 g/dL — AB (ref 12.0–15.0)
MCH: 25.1 pg — AB (ref 26.0–34.0)
MCHC: 32.1 g/dL (ref 30.0–36.0)
MCV: 78.2 fL (ref 78.0–100.0)
Platelets: 523 10*3/uL — ABNORMAL HIGH (ref 150–400)
RBC: 4.58 MIL/uL (ref 3.87–5.11)
RDW: 18.5 % — AB (ref 11.5–15.5)
WBC: 10 10*3/uL (ref 4.0–10.5)

## 2013-11-25 LAB — I-STAT ARTERIAL BLOOD GAS, ED
Acid-base deficit: 6 mmol/L — ABNORMAL HIGH (ref 0.0–2.0)
BICARBONATE: 19.2 meq/L — AB (ref 20.0–24.0)
O2 Saturation: 96 %
PCO2 ART: 35.1 mmHg (ref 35.0–45.0)
PH ART: 7.347 — AB (ref 7.350–7.450)
Patient temperature: 98.6
TCO2: 20 mmol/L (ref 0–100)
pO2, Arterial: 84 mmHg (ref 80.0–100.0)

## 2013-11-25 LAB — BASIC METABOLIC PANEL
BUN: 11 mg/dL (ref 6–23)
CHLORIDE: 104 meq/L (ref 96–112)
CO2: 20 mEq/L (ref 19–32)
CREATININE: 0.78 mg/dL (ref 0.50–1.10)
Calcium: 10 mg/dL (ref 8.4–10.5)
GFR calc non Af Amer: >90 mL/min (ref >90)
Glucose, Bld: 117 mg/dL — ABNORMAL HIGH (ref 70–99)
Potassium: 4 mEq/L (ref 3.7–5.3)
Sodium: 140 mEq/L (ref 137–147)

## 2013-11-25 LAB — I-STAT TROPONIN, ED: TROPONIN I, POC: 0 ng/mL (ref 0.00–0.08)

## 2013-11-25 LAB — CBG MONITORING, ED: GLUCOSE-CAPILLARY: 168 mg/dL — AB (ref 70–99)

## 2013-11-25 MED ORDER — LORAZEPAM 2 MG/ML IJ SOLN
1.0000 mg | Freq: Once | INTRAMUSCULAR | Status: AC
  Administered 2013-11-25: 1 mg via INTRAVENOUS
  Filled 2013-11-25: qty 1

## 2013-11-25 MED ORDER — IPRATROPIUM-ALBUTEROL 0.5-2.5 (3) MG/3ML IN SOLN
3.0000 mL | RESPIRATORY_TRACT | Status: DC
  Administered 2013-11-25: 3 mL via RESPIRATORY_TRACT
  Filled 2013-11-25: qty 3

## 2013-11-25 MED ORDER — LORAZEPAM 2 MG/ML IJ SOLN
1.0000 mg | Freq: Once | INTRAMUSCULAR | Status: AC
  Administered 2013-11-25: 1 mg via INTRAVENOUS

## 2013-11-25 MED ORDER — RACEPINEPHRINE HCL 2.25 % IN NEBU
INHALATION_SOLUTION | RESPIRATORY_TRACT | Status: AC
Start: 2013-11-25 — End: 2013-11-25
  Filled 2013-11-25: qty 0.5

## 2013-11-25 MED ORDER — FENTANYL CITRATE 0.05 MG/ML IJ SOLN
50.0000 ug | Freq: Once | INTRAMUSCULAR | Status: AC
  Administered 2013-11-25: 50 ug via INTRAVENOUS
  Filled 2013-11-25: qty 2

## 2013-11-25 MED ORDER — LORAZEPAM 2 MG/ML IJ SOLN
INTRAMUSCULAR | Status: AC
  Filled 2013-11-25: qty 1

## 2013-11-25 MED ORDER — MORPHINE SULFATE 4 MG/ML IJ SOLN
4.0000 mg | Freq: Once | INTRAMUSCULAR | Status: AC
  Administered 2013-11-25: 4 mg via INTRAVENOUS
  Filled 2013-11-25: qty 1

## 2013-11-25 MED ORDER — RACEPINEPHRINE HCL 2.25 % IN NEBU
0.5000 mL | INHALATION_SOLUTION | Freq: Once | RESPIRATORY_TRACT | Status: AC
  Administered 2013-11-25: 0.5 mL via RESPIRATORY_TRACT

## 2013-11-25 MED ORDER — ALBUTEROL SULFATE (2.5 MG/3ML) 0.083% IN NEBU: 5.0000 mg | INHALATION_SOLUTION | Freq: Once | RESPIRATORY_TRACT | Status: DC

## 2013-11-25 MED ORDER — ALBUTEROL SULFATE (2.5 MG/3ML) 0.083% IN NEBU
5.0000 mg | INHALATION_SOLUTION | Freq: Once | RESPIRATORY_TRACT | Status: AC
  Administered 2013-11-25: 5 mg via RESPIRATORY_TRACT

## 2013-11-25 MED ORDER — SODIUM CHLORIDE 0.9 % IV BOLUS (SEPSIS)
1000.0000 mL | Freq: Once | INTRAVENOUS | Status: AC
  Administered 2013-11-25: 1000 mL via INTRAVENOUS

## 2013-11-25 MED ORDER — METHYLPREDNISOLONE SODIUM SUCC 125 MG IJ SOLR
125.0000 mg | Freq: Once | INTRAMUSCULAR | Status: AC
  Administered 2013-11-25: 125 mg via INTRAVENOUS
  Filled 2013-11-25: qty 2

## 2013-11-25 MED ORDER — ALBUTEROL SULFATE (2.5 MG/3ML) 0.083% IN NEBU
INHALATION_SOLUTION | RESPIRATORY_TRACT | Status: AC
  Filled 2013-11-25: qty 6

## 2013-11-25 MED ORDER — IPRATROPIUM BROMIDE 0.02 % IN SOLN
0.5000 mg | Freq: Once | RESPIRATORY_TRACT | Status: AC
  Administered 2013-11-25: 0.5 mg via RESPIRATORY_TRACT
  Filled 2013-11-25: qty 2.5

## 2013-11-25 MED ORDER — HYDROCOD POLST-CHLORPHEN POLST 10-8 MG/5ML PO LQCR
5.0000 mL | Freq: Once | ORAL | Status: AC
Start: 2013-11-25 — End: 2013-11-25
  Administered 2013-11-25: 5 mL via ORAL
  Filled 2013-11-25: qty 5

## 2013-11-25 MED ORDER — ALBUTEROL (5 MG/ML) CONTINUOUS INHALATION SOLN
10.0000 mg/h | INHALATION_SOLUTION | Freq: Once | RESPIRATORY_TRACT | Status: AC
  Administered 2013-11-25: 10 mg/h via RESPIRATORY_TRACT
  Filled 2013-11-25: qty 20

## 2013-11-25 MED ORDER — SODIUM CHLORIDE 0.9 % IV SOLN: INTRAVENOUS | Status: DC

## 2013-11-25 NOTE — ED Notes (Signed)
Admitting MD at bedside.

## 2013-11-25 NOTE — ED Provider Notes (Signed)
CSN: [id number]     Arrival date & time 11/25/13  1936 History   First MD Initiated Contact with Patient 11/25/13 1954     Chief Complaint  Patient presents with  . Shortness of Breath  . Wheezing     (Consider location/radiation/quality/duration/timing/severity/associated sxs/prior Treatment) The history is provided by the patient.   history of present illness: 38 year-old female with history of asthma and vocal cord dysfunction who presents with chief complaint of shortness of breath and wheezing.  Onset of symptoms was today. Symptoms have been constant and are described as severe. Patient has taken her albuterol at home without relief. She denies any history of fevers or other recent illness.  Past Medical History  Diagnosis Date  . Asthma   . Anxiety   . COPD (chronic obstructive pulmonary disease)   . Bronchitis    Past Surgical History  Procedure Laterality Date  . Spinal fusion     Family History  Problem Relation Age of Onset  . HIV Mother   . Heart disease Father   . CVA Father   . Heart disease Other   . Emphysema Other    History  Substance Use Topics  . Smoking status: Former Smoker    Quit date: 08/16/2013  . Smokeless tobacco: Never Used  . Alcohol Use: Yes     Comment: occasional   OB History   Grav Para Term Preterm Abortions TAB SAB Ect Mult Living                 Review of Systems  Constitutional: Negative for fever and chills.  HENT: Negative for congestion.   Eyes: Negative for pain.  Respiratory: Positive for cough, chest tightness, shortness of breath, wheezing and stridor.   Cardiovascular: Negative for chest pain.  Gastrointestinal: Negative for nausea, vomiting, abdominal pain, diarrhea and constipation.  Genitourinary: Negative for dysuria.  Musculoskeletal: Negative for back pain.  Skin: Negative for rash and wound.  Neurological: Negative for headaches.  All other systems reviewed and are negative.     Allergies  Robitussin  dm; Nsaids; and Tramadol  Home Medications   Current Outpatient Rx  Name  Route  Sig  Dispense  Refill  . albuterol (PROVENTIL HFA;VENTOLIN HFA) 108 (90 BASE) MCG/ACT inhaler   Inhalation   Inhale 2 puffs into the lungs every 4 (four) hours as needed for wheezing.   3.7 g   1   . albuterol (PROVENTIL) (5 MG/ML) 0.5% nebulizer solution   Nebulization   Take 0.5 mLs (2.5 mg total) by nebulization every 6 (six) hours as needed for wheezing or shortness of breath.   20 mL   2   . alprazolam (XANAX) 2 MG tablet   Oral   Take 2 mg by mouth 3 (three) times daily as needed for sleep.         . budesonide (PULMICORT) 0.25 MG/2ML nebulizer solution   Nebulization   Take 2 mLs (0.25 mg total) by nebulization 2 (two) times daily.   60 mL   5   . montelukast (SINGULAIR) 10 MG tablet   Oral   Take 10 mg by mouth daily.          Marland Kitchen. morphine (MS CONTIN) 30 MG 12 hr tablet   Oral   Take 1 tablet (30 mg total) by mouth every 12 (twelve) hours. NO refills, Refill only by primary MD   30 tablet   0   . Oxycodone HCl 20 MG TABS  Oral   Take 1 tablet by mouth every 4 (four) hours as needed (for pain).         . pantoprazole (PROTONIX) 40 MG tablet   Oral   Take 1 tablet (40 mg total) by mouth daily.   30 tablet   0   . promethazine-dextromethorphan (PROMETHAZINE-DM) 6.25-15 MG/5ML syrup   Oral   Take 5 mLs by mouth 4 (four) times daily as needed for cough.         . zolpidem (AMBIEN) 10 MG tablet   Oral   Take 10 mg by mouth at bedtime.          BP 110/53  Pulse 101  Temp(Src) 99.4 F (37.4 C) (Oral)  Resp 25  SpO2 98%  LMP 10/12/2013 Physical Exam  Nursing note and vitals reviewed. Constitutional: She is oriented to person, place, and time. She appears well-developed and well-nourished. She appears distressed.  HENT:  Head: Normocephalic and atraumatic.  Eyes: Conjunctivae are normal.  Neck: Neck supple.  Cardiovascular: Regular rhythm, normal heart sounds  and intact distal pulses.  Tachycardia present.   Pulmonary/Chest: Stridor present. Tachypnea noted. Respiratory distress: moderate. She has wheezes. She has no rales.  Abdominal: Soft. She exhibits no distension. There is no tenderness.  Musculoskeletal: Normal range of motion.  Neurological: She is alert and oriented to person, place, and time.  Skin: Skin is warm and dry.    ED Course  Procedures (including critical care time) Labs Review Labs Reviewed  CBC - Abnormal; Notable for the following:    Hemoglobin 11.5 (*)    HCT 35.8 (*)    MCH 25.1 (*)    RDW 18.5 (*)    Platelets 523 (*)    All other components within normal limits  BASIC METABOLIC PANEL - Abnormal; Notable for the following:    Glucose, Bld 117 (*)    All other components within normal limits  CBG MONITORING, ED - Abnormal; Notable for the following:    Glucose-Capillary 168 (*)    All other components within normal limits  I-STAT ARTERIAL BLOOD GAS, ED - Abnormal; Notable for the following:    pH, Arterial 7.347 (*)    Bicarbonate 19.2 (*)    Acid-base deficit 6.0 (*)    All other components within normal limits  URINE RAPID DRUG SCREEN (HOSP PERFORMED)  BLOOD GAS, ARTERIAL  I-STAT TROPOININ, ED   Imaging Review Dg Chest Portable 1 View  11/25/2013   CLINICAL DATA:  Shortness of breath, wheezing and cough.  EXAM: PORTABLE CHEST - 1 VIEW  COMPARISON:  DG CHEST 1V PORT dated 10/29/2013  FINDINGS: The heart size and mediastinal contours are within normal limits. There is no evidence of pulmonary edema, consolidation, pneumothorax, nodule or pleural fluid. The visualized skeletal structures are unremarkable.  IMPRESSION: No active disease.   Electronically Signed   By: Irish Lack M.D.   On: 11/25/2013 20:30     EKG Interpretation   Date/Time:  Monday November 25 2013 19:47:12 EDT Ventricular Rate:  108 PR Interval:  150 QRS Duration: 72 QT Interval:  348 QTC Calculation: 466 R Axis:   61 Text  Interpretation:  Sinus tachycardia Otherwise normal ECG      MDM   Final diagnoses:  Stridor  Vocal cord dysfunction  Asthma    38 year old female with history of asthma and vocal cord dysfunction who presented in moderate respiratory distress with wheezing and stridor that started earlier today. Tachycardia and tachypnea with vital signs  otherwise stable. Oxygen saturation 100% on room air.  Concern for combination asthma exacerbation and vocal cord dysfunction. Giving Solu-Medrol, albuterol, and racemic epinephrine.  Middle improvement in symptoms after initial treatment. Patient complaining of pain in her side speech and breathing hard. She takes morphine at home for pain. Patient initially given fentanyl with no relief in pain. She was additionally given Tussionex to help cough with some relief. Patient was then given morphine for pain.  Chest x-ray negative for any acute abnormalities.  Weezing resolved. Patient with continued stridor.  Due to continued stridor medicine consulted and will admit for further management.  Cherre RobinsBryan Brance Dartt, MD 11/26/13 0030

## 2013-11-25 NOTE — H&P (Signed)
[facility] History and Physical  Julie Kerr EAV:[id number] DOB: 01/02/1976 DOA: 11/25/2013  Referring physician: ER physician. PCP: Lu DuffelWELLS,WENDELL, MD   Chief Complaint: Shortness of breath.  HPI: Julie Schildermanda Melle is a 38 y.o. female with history of COPD/asthma, previous history of tobacco abuse presents to the ER because of shortness of breath. Patient states she has been short of breath since yesterday morning with nonproductive cough and has been having pleuritic type of chest pain. In the ER patient was found to be wheezing and acutely short of breath and had received racemic epinephrine, Solu-Medrol nebulizer. Chest x-ray does not show anything acute. Patient has been admitted for further workup. Patient denies any nausea vomiting abdominal pain diarrhea. Patient's ABG does not show any worsening of carbon dioxide.   Review of Systems: As presented in the history of presenting illness, rest negative.  Past Medical History  Diagnosis Date  . Asthma   . Anxiety   . COPD (chronic obstructive pulmonary disease)   . Bronchitis    Past Surgical History  Procedure Laterality Date  . Spinal fusion     Social History:  reports that she quit smoking about 3 months ago. She has never used smokeless tobacco. She reports that she drinks alcohol. She reports that she does not use illicit drugs. Where does patient live home. Can patient participate in ADLs? Yes.  Allergies  Allergen Reactions  . Robitussin Dm [Dextromethorphan-Guaifenesin] Nausea And Vomiting  . Nsaids Hives  . Tramadol Hives    Family History:  Family History  Problem Relation Age of Onset  . HIV Mother   . Heart disease Father   . CVA Father   . Heart disease Other   . Emphysema Other       Prior to Admission medications   Medication Sig Start Date End Date Taking? Authorizing Provider  albuterol (PROVENTIL HFA;VENTOLIN HFA) 108 (90 BASE) MCG/ACT inhaler Inhale 2 puffs into the lungs every 4 (four) hours  as needed for wheezing. 08/06/13  Yes Maryann Mikhail, DO  albuterol (PROVENTIL) (5 MG/ML) 0.5% nebulizer solution Take 0.5 mLs (2.5 mg total) by nebulization every 6 (six) hours as needed for wheezing or shortness of breath. 11/01/13  Yes Ripudeep Jenna LuoK Rai, MD  alprazolam Prudy Feeler(XANAX) 2 MG tablet Take 2 mg by mouth 3 (three) times daily as needed for sleep.   Yes Historical Provider, MD  budesonide (PULMICORT) 0.25 MG/2ML nebulizer solution Take 2 mLs (0.25 mg total) by nebulization 2 (two) times daily. 11/01/13  Yes Ripudeep K Rai, MD  montelukast (SINGULAIR) 10 MG tablet Take 10 mg by mouth daily.    Yes Historical Provider, MD  morphine (MS CONTIN) 30 MG 12 hr tablet Take 1 tablet (30 mg total) by mouth every 12 (twelve) hours. NO refills, Refill only by primary MD 11/01/13  Yes Ripudeep Jenna LuoK Rai, MD  Oxycodone HCl 20 MG TABS Take 1 tablet by mouth every 4 (four) hours as needed (for pain).   Yes Historical Provider, MD  pantoprazole (PROTONIX) 40 MG tablet Take 1 tablet (40 mg total) by mouth daily. 09/11/13  Yes Maryann Mikhail, DO  promethazine-dextromethorphan (PROMETHAZINE-DM) 6.25-15 MG/5ML syrup Take 5 mLs by mouth 4 (four) times daily as needed for cough.   Yes Historical Provider, MD  zolpidem (AMBIEN) 10 MG tablet Take 10 mg by mouth at bedtime.   Yes Historical Provider, MD    Physical Exam: Filed Vitals:   11/25/13 2104 11/25/13 2230 11/25/13 2300 11/25/13 2315  BP:  103/46 115/53 114/96  Pulse:  118 114 120  Temp:      TempSrc:      Resp:  16 29 32  SpO2: 100% 99% 99% 98%     General:  Well-developed and moderately nourished.  Eyes: Anicteric no pallor.  ENT: No discharge from ears eyes nose mouth.  Neck: No mass felt.  Cardiovascular: S1-S2 heard.  Respiratory: Bilateral expiratory wheeze heard no crepitations.   Abdomen:  soft nontender bowel sounds present. No guarding or rigidity.  Skin:  no rash.   Musculoskeletal:  no edema.   Psychiatric:  appears normal.    Neurologic:  alert awake oriented to time place and person. Moves all extremities.   Labs on Admission:  Basic Metabolic Panel:  Recent Labs Lab 11/25/13 1945  NA 140  K 4.0  CL 104  CO2 20  GLUCOSE 117*  BUN 11  CREATININE 0.78  CALCIUM 10.0   Liver Function Tests: No results found for this basename: AST, ALT, ALKPHOS, BILITOT, PROT, ALBUMIN,  in the last 168 hours No results found for this basename: LIPASE, AMYLASE,  in the last 168 hours No results found for this basename: AMMONIA,  in the last 168 hours CBC:  Recent Labs Lab 11/25/13 1945  WBC 10.0  HGB 11.5*  HCT 35.8*  MCV 78.2  PLT 523*   Cardiac Enzymes: No results found for this basename: CKTOTAL, CKMB, CKMBINDEX, TROPONINI,  in the last 168 hours  BNP (last 3 results)  Recent Labs  06/14/13 1610 09/02/13 2358 10/29/13 0220  PROBNP 26.1 <5.0 8.1   CBG:  Recent Labs Lab 11/25/13 2342  GLUCAP 168*    Radiological Exams on Admission: Dg Chest Portable 1 View  11/25/2013   CLINICAL DATA:  Shortness of breath, wheezing and cough.  EXAM: PORTABLE CHEST - 1 VIEW  COMPARISON:  DG CHEST 1V PORT dated 10/29/2013  FINDINGS: The heart size and mediastinal contours are within normal limits. There is no evidence of pulmonary edema, consolidation, pneumothorax, nodule or pleural fluid. The visualized skeletal structures are unremarkable.  IMPRESSION: No active disease.   Electronically Signed   By: Irish LackGlenn  Yamagata M.D.   On: 11/25/2013 20:30    EKG: Independently reviewed. Sinus tachycardia.  Assessment/Plan Principal Problem:   Acute respiratory failure Active Problems:   COPD exacerbation   Chronic pain syndrome   Vocal cord dysfunction   1. Acute respiratory failure probably from acute bronchitis versus vocal cord dysfunction - patient has been placed on Solu-Medrol nebulizer and antibiotics. Check influenza PCR. Since patient complains of pleuritic-type chest pain we will check d-dimer and also  troponins and BNP. 2. Chronic anemia - follow CBC. Further workup as outpatient. 3. Chronic pain - continue home medications and when necessary IV morphine for breakthrough pain.    Code Status: Full code.  Family Communication: None.  Disposition Plan: Admit to inpatient.    Eduard ClosArshad N Adric Wrede [facility] Pager [phone].  If 7PM-7AM, please contact night-coverage [email or web address] Password [email or web address] 11/25/2013, 11:52 PM

## 2013-11-25 NOTE — ED Notes (Signed)
Pt has history of copd and asthma and is here with sob and tachypneic at triage.  Pt diaphoretic and reports feeling bad for one day, complains of chest hurting

## 2013-11-25 NOTE — ED Notes (Addendum)
Breathing treatment complete, pt has clear lung sounds, just a cough that sounds raspy. Pt states that her chest is hurting around the ribs. Pt states that she was intubated 2 months ago for an asthma exacerbation. Pt states that she attempted all home medications pta and took xanax. Pt remains able to lift off bed for assistance with listening to breathing sounds. Pt remains alert and oriented. 100% 3L.

## 2013-11-25 NOTE — ED Notes (Signed)
Pt no on any O2. Pt stats remain 100%.

## 2013-11-25 NOTE — ED Notes (Addendum)
Came in patient had taken off her contious neb mask. Pt informed there was still medication in the mask and to continue her breathing treatment. Pt informed me that she take 30mg  of morphine at home for her back and that 4mg  of morphine will not help her pain. Pt able to talk in more complete sentences. Pt request wife be called and made aware that she is here.

## 2013-11-26 DIAGNOSIS — J45901 Unspecified asthma with (acute) exacerbation: Secondary | ICD-10-CM

## 2013-11-26 DIAGNOSIS — R061 Stridor: Secondary | ICD-10-CM

## 2013-11-26 DIAGNOSIS — J189 Pneumonia, unspecified organism: Principal | ICD-10-CM

## 2013-11-26 DIAGNOSIS — R0602 Shortness of breath: Secondary | ICD-10-CM

## 2013-11-26 LAB — CBC
HCT: 31.9 % — ABNORMAL LOW (ref 36.0–46.0)
HCT: 32 % — ABNORMAL LOW (ref 36.0–46.0)
Hemoglobin: 10.3 g/dL — ABNORMAL LOW (ref 12.0–15.0)
Hemoglobin: 10.2 g/dL — ABNORMAL LOW (ref 12.0–15.0)
MCH: 25 pg — AB (ref 26.0–34.0)
MCH: 24.8 pg — ABNORMAL LOW (ref 26.0–34.0)
MCHC: 32.3 g/dL (ref 30.0–36.0)
MCHC: 31.9 g/dL (ref 30.0–36.0)
MCV: 77.4 fL — ABNORMAL LOW (ref 78.0–100.0)
MCV: 77.7 fL — ABNORMAL LOW (ref 78.0–100.0)
PLATELETS: 466 10*3/uL — AB (ref 150–400)
Platelets: 460 K/uL — ABNORMAL HIGH (ref 150–400)
RBC: 4.12 MIL/uL (ref 3.87–5.11)
RBC: 4.12 MIL/uL (ref 3.87–5.11)
RDW: 18.1 % — ABNORMAL HIGH (ref 11.5–15.5)
RDW: 18.2 % — ABNORMAL HIGH (ref 11.5–15.5)
WBC: 13.7 10*3/uL — ABNORMAL HIGH (ref 4.0–10.5)
WBC: 13.8 K/uL — ABNORMAL HIGH (ref 4.0–10.5)

## 2013-11-26 LAB — CREATININE, SERUM
CREATININE: 0.69 mg/dL (ref 0.50–1.10)
GFR calc Af Amer: >90 mL/min (ref >90)
GFR calc non Af Amer: >90 mL/min (ref >90)

## 2013-11-26 LAB — RAPID URINE DRUG SCREEN, HOSP PERFORMED
Amphetamines: NOT DETECTED
BARBITURATES: NOT DETECTED
Benzodiazepines: NOT DETECTED
COCAINE: NOT DETECTED
Opiates: POSITIVE — AB
Tetrahydrocannabinol: NOT DETECTED

## 2013-11-26 LAB — BASIC METABOLIC PANEL WITH GFR
BUN: 10 mg/dL (ref 6–23)
CO2: 18 meq/L — ABNORMAL LOW (ref 19–32)
Calcium: 9.7 mg/dL (ref 8.4–10.5)
Chloride: 103 meq/L (ref 96–112)
Creatinine, Ser: 0.68 mg/dL (ref 0.50–1.10)
GFR calc Af Amer: >90 mL/min
GFR calc non Af Amer: >90 mL/min
Glucose, Bld: 162 mg/dL — ABNORMAL HIGH (ref 70–99)
Potassium: 4.1 meq/L (ref 3.7–5.3)
Sodium: 140 meq/L (ref 137–147)

## 2013-11-26 LAB — INFLUENZA PANEL BY PCR (TYPE A & B)
H1N1 flu by pcr: NOT DETECTED
INFLBPCR: NEGATIVE
Influenza A By PCR: NEGATIVE

## 2013-11-26 LAB — PREGNANCY, URINE: Preg Test, Ur: NEGATIVE

## 2013-11-26 LAB — TROPONIN I
Troponin I: <0.3 ng/mL
Troponin I: <0.3 ng/mL (ref <0.30)
Troponin I: <0.3 ng/mL (ref <0.30)

## 2013-11-26 LAB — GLUCOSE, CAPILLARY: Glucose-Capillary: 180 mg/dL — ABNORMAL HIGH (ref 70–99)

## 2013-11-26 LAB — TSH: TSH: 0.324 u[IU]/mL — ABNORMAL LOW (ref 0.350–4.500)

## 2013-11-26 LAB — PRO B NATRIURETIC PEPTIDE: Pro B Natriuretic peptide (BNP): 10.4 pg/mL (ref 0–125)

## 2013-11-26 LAB — D-DIMER, QUANTITATIVE (NOT AT ARMC)

## 2013-11-26 MED ORDER — ACETAMINOPHEN 650 MG RE SUPP: 650.0000 mg | Freq: Four times a day (QID) | RECTAL | Status: DC | PRN

## 2013-11-26 MED ORDER — SODIUM CHLORIDE 0.9 % IJ SOLN
3.0000 mL | Freq: Two times a day (BID) | INTRAMUSCULAR | Status: DC
  Administered 2013-11-26 (×3): 3 mL via INTRAVENOUS
  Administered 2013-11-27: 12:00:00 via INTRAVENOUS
  Administered 2013-11-27 – 2013-11-28 (×2): 3 mL via INTRAVENOUS

## 2013-11-26 MED ORDER — ZOLPIDEM TARTRATE 5 MG PO TABS
5.0000 mg | ORAL_TABLET | Freq: Every day | ORAL | Status: DC
  Administered 2013-11-26 – 2013-11-27 (×3): 5 mg via ORAL
  Filled 2013-11-26 (×3): qty 1

## 2013-11-26 MED ORDER — MONTELUKAST SODIUM 10 MG PO TABS
10.0000 mg | ORAL_TABLET | Freq: Every day | ORAL | Status: DC
  Administered 2013-11-26 – 2013-11-27 (×3): 10 mg via ORAL
  Filled 2013-11-26 (×4): qty 1

## 2013-11-26 MED ORDER — IPRATROPIUM BROMIDE 0.02 % IN SOLN
0.5000 mg | Freq: Four times a day (QID) | RESPIRATORY_TRACT | Status: DC
  Administered 2013-11-26 (×2): 0.5 mg via RESPIRATORY_TRACT
  Filled 2013-11-26 (×3): qty 2.5

## 2013-11-26 MED ORDER — MORPHINE SULFATE 2 MG/ML IJ SOLN
2.0000 mg | INTRAMUSCULAR | Status: DC | PRN
  Administered 2013-11-26 – 2013-11-28 (×7): 2 mg via INTRAVENOUS
  Filled 2013-11-26 (×7): qty 1

## 2013-11-26 MED ORDER — SODIUM CHLORIDE 0.9 % IV SOLN
INTRAVENOUS | Status: DC
  Administered 2013-11-26: 01:00:00 via INTRAVENOUS

## 2013-11-26 MED ORDER — WHITE PETROLATUM GEL
Status: DC | PRN
  Filled 2013-11-26: qty 5

## 2013-11-26 MED ORDER — MORPHINE SULFATE ER 15 MG PO TBCR
30.0000 mg | EXTENDED_RELEASE_TABLET | Freq: Two times a day (BID) | ORAL | Status: DC
  Administered 2013-11-26 – 2013-11-28 (×6): 30 mg via ORAL
  Filled 2013-11-26 (×6): qty 2

## 2013-11-26 MED ORDER — METHYLPREDNISOLONE SODIUM SUCC 40 MG IJ SOLR
40.0000 mg | INTRAMUSCULAR | Status: DC
  Filled 2013-11-26: qty 1

## 2013-11-26 MED ORDER — OXYCODONE HCL 5 MG PO TABS
20.0000 mg | ORAL_TABLET | ORAL | Status: DC | PRN
  Administered 2013-11-26 – 2013-11-28 (×9): 20 mg via ORAL
  Filled 2013-11-26 (×9): qty 4

## 2013-11-26 MED ORDER — BENZONATATE 100 MG PO CAPS
200.0000 mg | ORAL_CAPSULE | Freq: Three times a day (TID) | ORAL | Status: DC | PRN
  Administered 2013-11-28: 200 mg via ORAL
  Filled 2013-11-26: qty 2

## 2013-11-26 MED ORDER — BUDESONIDE 0.25 MG/2ML IN SUSP
0.2500 mg | Freq: Two times a day (BID) | RESPIRATORY_TRACT | Status: DC
  Administered 2013-11-26 – 2013-11-27 (×2): 0.25 mg via RESPIRATORY_TRACT
  Filled 2013-11-26 (×8): qty 2

## 2013-11-26 MED ORDER — METHYLPREDNISOLONE SODIUM SUCC 125 MG IJ SOLR
60.0000 mg | Freq: Four times a day (QID) | INTRAMUSCULAR | Status: DC
  Administered 2013-11-26 – 2013-11-27 (×4): 60 mg via INTRAVENOUS
  Filled 2013-11-26 (×8): qty 0.96

## 2013-11-26 MED ORDER — PANTOPRAZOLE SODIUM 40 MG PO TBEC
40.0000 mg | DELAYED_RELEASE_TABLET | Freq: Every day | ORAL | Status: DC
  Administered 2013-11-26 – 2013-11-28 (×3): 40 mg via ORAL
  Filled 2013-11-26 (×2): qty 1

## 2013-11-26 MED ORDER — ALPRAZOLAM 0.5 MG PO TABS
2.0000 mg | ORAL_TABLET | Freq: Three times a day (TID) | ORAL | Status: DC | PRN
  Administered 2013-11-26 – 2013-11-27 (×4): 2 mg via ORAL
  Filled 2013-11-26 (×5): qty 4

## 2013-11-26 MED ORDER — ONDANSETRON HCL 4 MG PO TABS: 4.0000 mg | ORAL_TABLET | Freq: Four times a day (QID) | ORAL | Status: DC | PRN

## 2013-11-26 MED ORDER — MORPHINE SULFATE 2 MG/ML IJ SOLN
1.0000 mg | INTRAMUSCULAR | Status: DC | PRN
  Administered 2013-11-26 (×4): 1 mg via INTRAVENOUS
  Filled 2013-11-26 (×4): qty 1

## 2013-11-26 MED ORDER — HYDROCOD POLST-CHLORPHEN POLST 10-8 MG/5ML PO LQCR
5.0000 mL | Freq: Two times a day (BID) | ORAL | Status: DC | PRN
  Administered 2013-11-26 – 2013-11-27 (×2): 5 mL via ORAL
  Filled 2013-11-26 (×2): qty 5

## 2013-11-26 MED ORDER — ALBUTEROL SULFATE (2.5 MG/3ML) 0.083% IN NEBU: 2.5000 mg | INHALATION_SOLUTION | RESPIRATORY_TRACT | Status: DC | PRN

## 2013-11-26 MED ORDER — IPRATROPIUM-ALBUTEROL 0.5-2.5 (3) MG/3ML IN SOLN
3.0000 mL | Freq: Four times a day (QID) | RESPIRATORY_TRACT | Status: DC
  Administered 2013-11-27 (×3): 3 mL via RESPIRATORY_TRACT
  Filled 2013-11-26 (×4): qty 3

## 2013-11-26 MED ORDER — LEVOFLOXACIN IN D5W 750 MG/150ML IV SOLN
750.0000 mg | Freq: Every day | INTRAVENOUS | Status: DC
  Administered 2013-11-26 (×2): 750 mg via INTRAVENOUS
  Filled 2013-11-26 (×4): qty 150

## 2013-11-26 MED ORDER — ACETAMINOPHEN 325 MG PO TABS: 650.0000 mg | ORAL_TABLET | Freq: Four times a day (QID) | ORAL | Status: DC | PRN

## 2013-11-26 MED ORDER — ALBUTEROL SULFATE (2.5 MG/3ML) 0.083% IN NEBU
2.5000 mg | INHALATION_SOLUTION | Freq: Four times a day (QID) | RESPIRATORY_TRACT | Status: DC
  Administered 2013-11-26: 2.5 mg via RESPIRATORY_TRACT
  Filled 2013-11-26: qty 3

## 2013-11-26 MED ORDER — ONDANSETRON HCL 4 MG/2ML IJ SOLN: 4.0000 mg | Freq: Four times a day (QID) | INTRAMUSCULAR | Status: DC | PRN

## 2013-11-26 MED ORDER — ALBUTEROL SULFATE (5 MG/ML) 0.5% IN NEBU: 2.5000 mg | INHALATION_SOLUTION | Freq: Four times a day (QID) | RESPIRATORY_TRACT | Status: DC | PRN

## 2013-11-26 MED ORDER — ENOXAPARIN SODIUM 40 MG/0.4ML ~~LOC~~ SOLN
40.0000 mg | SUBCUTANEOUS | Status: DC
  Administered 2013-11-26 – 2013-11-28 (×3): 40 mg via SUBCUTANEOUS
  Filled 2013-11-26 (×3): qty 0.4

## 2013-11-26 MED ORDER — ALBUTEROL SULFATE (2.5 MG/3ML) 0.083% IN NEBU
2.5000 mg | INHALATION_SOLUTION | RESPIRATORY_TRACT | Status: DC
  Administered 2013-11-26 (×2): 2.5 mg via RESPIRATORY_TRACT
  Filled 2013-11-26 (×3): qty 3

## 2013-11-26 NOTE — Progress Notes (Signed)
Pt given 2mg  PO Xanax at this time per pt request; will cont. To monitor.

## 2013-11-26 NOTE — Progress Notes (Signed)
[facility] PROGRESS NOTE  Julie Kerr UJW:[id number] DOB: 04/16/1976 DOA: 11/25/2013 PCP: Lu DuffelWELLS,WENDELL, MD  Assessment/Plan: 1. Acute hypoxemic respiratory failure -Evidence by respiratory of 32, patient in respiratory distress, requiring racemic epinephrine -Likely secondary to community acquire pneumonia with superimposed COPD exacerbation. -Initial chest x-ray did not reveal acute infiltrates. -Continue IV steroids, IV antibiotics, nebs, supplemental oxygen 2.   Chronic obstructive pulmonary disease exacerbation -Likely precipitated by underlying infectious process -Continue Solu-Medrol 60 mg IV every 6 hours, nebulizers, para IV antimicrobial therapy -Patient reports quitting smoking 7 months ago 3.   Suspected community acquire pneumonia -It is possible underlying infectious process precipitating COPD -Continue Levaquin 750 mg IV every 24 hours -Flu swab was negative -Followup on repeat a.m. chest x-ray 4.  Abnormal TSH -TSH at 0.324 -Likely related to acute illness and steroids -Would recommend repeat TSH as an outpatient 5. DVT prophylaxis -Lovenox  Code Status: Full code Family Communication:  Disposition Plan: Continue IV steroids, antibiotics, nebs   Antibiotics:  Levaquin 750 mg IV every 24 hours  HPI/Subjective: Patient is a pleasant 38 year old female with a past medical history of COPD/asthma who was admitted to the medicine service on 11/25/2013 presented to the emergency room with complaints of worsening cough, shortness of breath, generalized weakness, malaise, bilateral arthralgias. She was found to be in respiratory distress, administered racemic epinephrine, IV steroids. Chest x-ray did not show acute changes. Flu swab was negative. She was admitted to telemetry, started on Solu-Medrol 60 mg IV every 6 hours, schedule nebs, empiric IV antibiotic therapy with Levaquin.  Objective: Filed Vitals:   11/26/13 1500  BP: 114/68  Pulse: 90  Temp: 98.9  F (37.2 C)  Resp: 18    Intake/Output Summary (Last 24 hours) at 11/26/13 1736 Last data filed at 11/26/13 1152  Gross per 24 hour  Intake    840 ml  Output   1900 ml  Net  -1060 ml   Filed Weights   11/26/13 0046  Weight: 83.915 kg (185 lb)    Exam:   General:  Patient is in mild distress, on supplemental oxygen.  Cardiovascular: Tachycardic, regular rate rhythm normal S1-S2  Respiratory: Diminished breath sounds bilaterally with bilateral expiratory wheezes, appears uncomfortable  Abdomen: Soft nontender nondistended positive bowel sound  Musculoskeletal: No edema   Data Reviewed: Basic Metabolic Panel:  Recent Labs Lab 11/25/13 1945 11/26/13 0130 11/26/13 0500  NA 140  --  140  K 4.0  --  4.1  CL 104  --  103  CO2 20  --  18*  GLUCOSE 117*  --  162*  BUN 11  --  10  CREATININE 0.78 0.69 0.68  CALCIUM 10.0  --  9.7   Liver Function Tests: No results found for this basename: AST, ALT, ALKPHOS, BILITOT, PROT, ALBUMIN,  in the last 168 hours No results found for this basename: LIPASE, AMYLASE,  in the last 168 hours No results found for this basename: AMMONIA,  in the last 168 hours CBC:  Recent Labs Lab 11/25/13 1945 11/26/13 0130 11/26/13 0500  WBC 10.0 13.7* 13.8*  HGB 11.5* 10.3* 10.2*  HCT 35.8* 31.9* 32.0*  MCV 78.2 77.4* 77.7*  PLT 523* 466* 460*   Cardiac Enzymes:  Recent Labs Lab 11/26/13 0500 11/26/13 0930 11/26/13 1440  TROPONINI <0.30 <0.30 <0.30   BNP (last 3 results)  Recent Labs  09/02/13 2358 10/29/13 0220 11/26/13 0500  PROBNP <5.0 8.1 10.4   CBG:  Recent Labs Lab 11/25/13 2342 11/26/13 1122  GLUCAP 168* 180*    No results found for this or any previous visit (from the past 240 hour(s)).   Studies: Dg Chest Portable 1 View  11/25/2013   CLINICAL DATA:  Shortness of breath, wheezing and cough.  EXAM: PORTABLE CHEST - 1 VIEW  COMPARISON:  DG CHEST 1V PORT dated 10/29/2013  FINDINGS: The heart size and  mediastinal contours are within normal limits. There is no evidence of pulmonary edema, consolidation, pneumothorax, nodule or pleural fluid. The visualized skeletal structures are unremarkable.  IMPRESSION: No active disease.   Electronically Signed   By: Irish LackGlenn  Yamagata M.D.   On: 11/25/2013 20:30    Scheduled Meds: . albuterol  2.5 mg Nebulization Q6H  . budesonide (PULMICORT) nebulizer solution  0.25 mg Nebulization BID  . enoxaparin (LOVENOX) injection  40 mg Subcutaneous Q24H  . ipratropium  0.5 mg Nebulization Q6H  . levofloxacin (LEVAQUIN) IV  750 mg Intravenous QHS  . methylPREDNISolone (SOLU-MEDROL) injection  60 mg Intravenous Q6H  . montelukast  10 mg Oral QHS  . morphine  30 mg Oral Q12H  . pantoprazole  40 mg Oral Daily  . sodium chloride  3 mL Intravenous Q12H  . zolpidem  5 mg Oral QHS   Continuous Infusions: . sodium chloride 20 mL/hr at 11/26/13 0114    Principal Problem:   Acute respiratory failure Active Problems:   COPD exacerbation   Chronic pain syndrome   Vocal cord dysfunction    Time spent: 35 minutes    Julie Kerr  [facility] Pager [phone]. If 7PM-7AM, please contact night-coverage at [email or web address], password [email or web address] 11/26/2013, 5:36 PM  LOS: 1 day

## 2013-11-26 NOTE — Progress Notes (Signed)
ANTIBIOTIC CONSULT NOTE - INITIAL  Pharmacy Consult for levaquin  Indication: ARF  Allergies  Allergen Reactions  . Robitussin Dm [Dextromethorphan-Guaifenesin] Nausea And Vomiting  . Nsaids Hives  . Tramadol Hives    Patient Measurements: Weight: 185 lb (83.915 kg) Adjusted Body Weight:   Vital Signs: Temp: 99.4 F (37.4 C) (04/13 1942) Temp src: Oral (04/13 1942) BP: 108/66 mmHg (04/14 0046) Pulse Rate: 107 (04/14 0046) Intake/Output from previous day:   Intake/Output from this shift:    Labs:  Recent Labs  11/25/13 1945  WBC 10.0  HGB 11.5*  PLT 523*  CREATININE 0.78   The CrCl is unknown because both a height and weight (above a minimum accepted value) are required for this calculation. No results found for this basename: VANCOTROUGH, Leodis BinetVANCOPEAK, VANCORANDOM, GENTTROUGH, GENTPEAK, GENTRANDOM, TOBRATROUGH, TOBRAPEAK, TOBRARND, AMIKACINPEAK, AMIKACINTROU, AMIKACIN,  in the last 72 hours   Microbiology: Recent Results (from the past 720 hour(s))  MRSA PCR SCREENING     Status: None   Collection Time    10/29/13  6:52 AM      Result Value Ref Range Status   MRSA by PCR NEGATIVE  NEGATIVE Final   Comment:            The GeneXpert MRSA Assay (FDA     approved for NASAL specimens     only), is one component of a     comprehensive MRSA colonization     surveillance program. It is not     intended to diagnose MRSA     infection nor to guide or     monitor treatment for     MRSA infections.    Medical History: Past Medical History  Diagnosis Date  . Asthma   . Anxiety   . COPD (chronic obstructive pulmonary disease)   . Bronchitis     Medications:  Prescriptions prior to admission  Medication Sig Dispense Refill  . albuterol (PROVENTIL HFA;VENTOLIN HFA) 108 (90 BASE) MCG/ACT inhaler Inhale 2 puffs into the lungs every 4 (four) hours as needed for wheezing.  3.7 g  1  . albuterol (PROVENTIL) (5 MG/ML) 0.5% nebulizer solution Take 0.5 mLs (2.5 mg total)  by nebulization every 6 (six) hours as needed for wheezing or shortness of breath.  20 mL  2  . alprazolam (XANAX) 2 MG tablet Take 2 mg by mouth 3 (three) times daily as needed for sleep.      . budesonide (PULMICORT) 0.25 MG/2ML nebulizer solution Take 2 mLs (0.25 mg total) by nebulization 2 (two) times daily.  60 mL  5  . montelukast (SINGULAIR) 10 MG tablet Take 10 mg by mouth daily.       Marland Kitchen. morphine (MS CONTIN) 30 MG 12 hr tablet Take 1 tablet (30 mg total) by mouth every 12 (twelve) hours. NO refills, Refill only by primary MD  30 tablet  0  . Oxycodone HCl 20 MG TABS Take 1 tablet by mouth every 4 (four) hours as needed (for pain).      . pantoprazole (PROTONIX) 40 MG tablet Take 1 tablet (40 mg total) by mouth daily.  30 tablet  0  . promethazine-dextromethorphan (PROMETHAZINE-DM) 6.25-15 MG/5ML syrup Take 5 mLs by mouth 4 (four) times daily as needed for cough.      . zolpidem (AMBIEN) 10 MG tablet Take 10 mg by mouth at bedtime.       Assessment: ARF vs asthma with vocal cord involvement and copd exacerbation.  Goal of Therapy:  Renal dose levaquin  Plan:  levaquin 750mg  iv q24h   Janice CoffinWilliam Jonathan Bellagrace Sylvan 11/26/2013,12:54 AM

## 2013-11-26 NOTE — Progress Notes (Signed)
Pt requesting cough med at this time; no PRN's ordered; MD paged; will await callback.

## 2013-11-26 NOTE — Progress Notes (Signed)
Utilization Review Completed.Julie Veltre T Dowell4/14/2015  

## 2013-11-26 NOTE — Progress Notes (Signed)
Pt given tussinex per pt request; will cont. To monitor.

## 2013-11-26 NOTE — ED Provider Notes (Signed)
Medical screening examination/treatment/procedure(s) were conducted as a shared visit with resident-physician practitioner(s) and myself.  I personally evaluated the patient during the encounter.  Pt is a 38 y.o. female with pmhx of vocal chord dysfunction & asthma presenting with resp distress. O2 saturations nml, though she has significant upper airway noise.  Pt found to have little response to albuterol, racemic epi, ativan, morphine, and fentanyl and was admitted to medical service. Suspect vocal chord dysfunction.   EKG Interpretation  Date/Time:  Monday November 25 2013 19:47:12 EDT Ventricular Rate:  108 PR Interval:  150 QRS Duration: 72 QT Interval:  348 QTC Calculation: 466 R Axis:   61 Text Interpretation:  Sinus tachycardia Otherwise normal ECG        Shanna CiscoMegan E Docherty, MD 11/26/13 1051

## 2013-11-27 ENCOUNTER — Inpatient Hospital Stay (HOSPITAL_COMMUNITY): Payer: Medicaid Other

## 2013-11-27 LAB — BASIC METABOLIC PANEL
BUN: 9 mg/dL (ref 6–23)
CALCIUM: 10.1 mg/dL (ref 8.4–10.5)
CO2: 21 meq/L (ref 19–32)
CREATININE: 0.65 mg/dL (ref 0.50–1.10)
Chloride: 105 mEq/L (ref 96–112)
GFR calc Af Amer: >90 mL/min (ref >90)
GFR calc non Af Amer: >90 mL/min (ref >90)
Glucose, Bld: 216 mg/dL — ABNORMAL HIGH (ref 70–99)
Potassium: 4.2 mEq/L (ref 3.7–5.3)
SODIUM: 142 meq/L (ref 137–147)

## 2013-11-27 LAB — CBC
HEMATOCRIT: 32.5 % — AB (ref 36.0–46.0)
HEMOGLOBIN: 10.2 g/dL — AB (ref 12.0–15.0)
MCH: 25.1 pg — ABNORMAL LOW (ref 26.0–34.0)
MCHC: 31.4 g/dL (ref 30.0–36.0)
MCV: 79.9 fL (ref 78.0–100.0)
Platelets: 477 10*3/uL — ABNORMAL HIGH (ref 150–400)
RBC: 4.07 MIL/uL (ref 3.87–5.11)
RDW: 18.8 % — ABNORMAL HIGH (ref 11.5–15.5)
WBC: 20.5 10*3/uL — ABNORMAL HIGH (ref 4.0–10.5)

## 2013-11-27 LAB — PROCALCITONIN: Procalcitonin: <0.1 ng/mL

## 2013-11-27 MED ORDER — ARFORMOTEROL TARTRATE 15 MCG/2ML IN NEBU
15.0000 ug | INHALATION_SOLUTION | Freq: Two times a day (BID) | RESPIRATORY_TRACT | Status: DC
  Filled 2013-11-27 (×5): qty 2

## 2013-11-27 MED ORDER — IPRATROPIUM-ALBUTEROL 0.5-2.5 (3) MG/3ML IN SOLN: 3.0000 mL | Freq: Two times a day (BID) | RESPIRATORY_TRACT | Status: DC

## 2013-11-27 MED ORDER — LEVOFLOXACIN 750 MG PO TABS
750.0000 mg | ORAL_TABLET | Freq: Every day | ORAL | Status: DC
  Administered 2013-11-27: 750 mg via ORAL
  Filled 2013-11-27 (×2): qty 1

## 2013-11-27 MED ORDER — PREDNISONE 50 MG PO TABS
60.0000 mg | ORAL_TABLET | Freq: Every day | ORAL | Status: DC
  Administered 2013-11-28: 60 mg via ORAL
  Filled 2013-11-27 (×3): qty 1

## 2013-11-27 NOTE — Progress Notes (Signed)
Inpatient Diabetes Program Recommendations  AACE/ADA: New Consensus Statement on Inpatient Glycemic Control  Target Ranges:  Prepandial:   less than 140 mg/dL      Peak postprandial:   less than 180 mg/dL (1-2 hours)      Critically ill patients:  140 - 180 mg/dL  Pager:  [phone] Hours:  8 am-10pm   Reason for Visit: Elevated glucose while on steroids.  Inpatient Diabetes Program Recommendations Correction (SSI): Add Novolog Correction while on steroids  Alfredia Clientrissy Soren Lazarz PhD, RN, BC-ADM Diabetes Coordinator  Office:  [phone] Team Pager:  [phone]

## 2013-11-27 NOTE — Progress Notes (Addendum)
[facility] PROGRESS NOTE  Julie Kerr ZOX:[id number] DOB: 04/29/1976 DOA: 11/25/2013 PCP: Julie Kerr  Assessment/Plan: 1. Acute hypoxemic respiratory failure -Evidence by respiratory of 32, patient in respiratory distress, requiring racemic epinephrine -Likely secondary to community acquire pneumonia with superimposed COPD exacerbation. -Initial chest x-ray did not reveal acute infiltrates. -Continue , IV antibiotics, nebs, supplemental oxygen as needed.  - will transition to oral steroids today. Add on brovana, can be discharged home on dulera.  2.   Chronic obstructive pulmonary disease exacerbation -Likely precipitated by underlying infectious process -she was started on IV steroids, her exam today shows no wheezing, good air entry bilateral. Will transition to oral steroids.  -Patient reports quitting smoking 7 months ago 3.   Suspected community acquire pneumonia -It is possible underlying infectious process precipitating COPD -Continue Levaquin 750 mg IV every 24 hours -Flu swab was negative -Followup on repeat  chest x-ray shows clear chest , no infiltrates.  4.  Abnormal TSH -TSH at 0.324 -Likely related to acute illness and steroids -Would recommend repeat TSH as an outpatient 5. DVT prophylaxis -Lovenox Anemia: Normocytic. Stable. Outpatient follow up.   Anxiety and panic attacks: On xanax prn.   Code Status: Full code Family Communication: none at bedside.  Disposition Plan: pending, social worker eval.    Antibiotics:  Levaquin 750 mg IV every 24 hours  HPI/Subjective: Patient is a pleasant 38 year old female with a past medical history of COPD/asthma who was admitted to the medicine service on 11/25/2013 presented to the emergency room with complaints of worsening cough, shortness of breath, generalized weakness, malaise, bilateral arthralgias. She was found to be in respiratory distress, administered racemic epinephrine, IV steroids. Chest  x-ray did not show acute changes. Flu swab was negative. She was admitted to telemetry, started on Solu-Medrol 60 mg IV every 6 hours, schedule nebs, empiric IV antibiotic therapy with Levaquin.  Objective: Filed Vitals:   11/27/13 0350  BP: 119/72  Pulse: 87  Temp: 98.6 F (37 C)  Resp: 20    Intake/Output Summary (Last 24 hours) at 11/27/13 1143 Last data filed at 11/27/13 0900  Gross per 24 hour  Intake    600 ml  Output   1000 ml  Net   -400 ml   Filed Weights   11/26/13 0046  Weight: 83.915 kg (185 lb)    Exam:   General:  Patient is in mild distress, on supplemental oxygen.  Cardiovascular: Tachycardic, regular rate rhythm normal S1-S2  Respiratory: comfortable, on room air, no wheezing heard, good air entry bilateral.   Abdomen: Soft nontender nondistended positive bowel sound  Musculoskeletal: No edema   Data Reviewed: Basic Metabolic Panel:  Recent Labs Lab 11/25/13 1945 11/26/13 0130 11/26/13 0500 11/27/13 0344  NA 140  --  140 142  K 4.0  --  4.1 4.2  CL 104  --  103 105  CO2 20  --  18* 21  GLUCOSE 117*  --  162* 216*  BUN 11  --  10 9  CREATININE 0.78 0.69 0.68 0.65  CALCIUM 10.0  --  9.7 10.1   Liver Function Tests: No results found for this basename: AST, ALT, ALKPHOS, BILITOT, PROT, ALBUMIN,  in the last 168 hours No results found for this basename: LIPASE, AMYLASE,  in the last 168 hours No results found for this basename: AMMONIA,  in the last 168 hours CBC:  Recent Labs Lab 11/25/13 1945 11/26/13 0130 11/26/13 0500 11/27/13 0344  WBC 10.0 13.7* 13.8* 20.5*  HGB 11.5* 10.3* 10.2* 10.2*  HCT 35.8* 31.9* 32.0* 32.5*  MCV 78.2 77.4* 77.7* 79.9  PLT 523* 466* 460* 477*   Cardiac Enzymes:  Recent Labs Lab 11/26/13 0500 11/26/13 0930 11/26/13 1440  TROPONINI <0.30 <0.30 <0.30   BNP (last 3 results)  Recent Labs  09/02/13 2358 10/29/13 0220 11/26/13 0500  PROBNP <5.0 8.1 10.4   CBG:  Recent Labs Lab  11/25/13 2342 11/26/13 1122  GLUCAP 168* 180*    No results found for this or any previous visit (from the past 240 hour(s)).   Studies: Dg Chest 2 View  11/27/2013   CLINICAL DATA:  Shortness of breath and chest pain  EXAM: CHEST  2 VIEW  COMPARISON:  November 25, 2013  FINDINGS: There is no edema or consolidation. Heart size and pulmonary vascularity are normal. No adenopathy. No bone lesions.  IMPRESSION: No edema or consolidation.   Electronically Signed   By: Bretta BangWilliam  Woodruff M.D.   On: 11/27/2013 07:55   Dg Chest Portable 1 View  11/25/2013   CLINICAL DATA:  Shortness of breath, wheezing and cough.  EXAM: PORTABLE CHEST - 1 VIEW  COMPARISON:  DG CHEST 1V PORT dated 10/29/2013  FINDINGS: The heart size and mediastinal contours are within normal limits. There is no evidence of pulmonary edema, consolidation, pneumothorax, nodule or pleural fluid. The visualized skeletal structures are unremarkable.  IMPRESSION: No active disease.   Electronically Signed   By: Irish LackGlenn  Yamagata M.D.   On: 11/25/2013 20:30    Scheduled Meds: . budesonide (PULMICORT) nebulizer solution  0.25 mg Nebulization BID  . enoxaparin (LOVENOX) injection  40 mg Subcutaneous Q24H  . ipratropium-albuterol  3 mL Nebulization Q6H  . levofloxacin  750 mg Oral QHS  . montelukast  10 mg Oral QHS  . morphine  30 mg Oral Q12H  . pantoprazole  40 mg Oral Daily  . [START ON 11/28/2013] predniSONE  60 mg Oral QAC breakfast  . sodium chloride  3 mL Intravenous Q12H  . zolpidem  5 mg Oral QHS   Continuous Infusions: . sodium chloride 20 mL/hr at 11/26/13 0114    Principal Problem:   Acute respiratory failure Active Problems:   COPD exacerbation   Chronic pain syndrome   Vocal cord dysfunction    Time spent: 35 minutes    Julie Kerr  [facility] Pager [phone]. If 7PM-7AM, please contact night-coverage at [email or web address], password [email or web address] 11/27/2013, 11:43 AM  LOS: 2 days
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
## 2013-11-28 ENCOUNTER — Inpatient Hospital Stay (HOSPITAL_COMMUNITY): Payer: Medicaid Other

## 2013-11-28 DIAGNOSIS — G894 Chronic pain syndrome: Secondary | ICD-10-CM

## 2013-11-28 DIAGNOSIS — J383 Other diseases of vocal cords: Secondary | ICD-10-CM

## 2013-11-28 DIAGNOSIS — J9600 Acute respiratory failure, unspecified whether with hypoxia or hypercapnia: Secondary | ICD-10-CM

## 2013-11-28 DIAGNOSIS — J441 Chronic obstructive pulmonary disease with (acute) exacerbation: Secondary | ICD-10-CM

## 2013-11-28 LAB — CBC
HCT: 34.1 % — ABNORMAL LOW (ref 36.0–46.0)
Hemoglobin: 10.5 g/dL — ABNORMAL LOW (ref 12.0–15.0)
MCH: 24.8 pg — ABNORMAL LOW (ref 26.0–34.0)
MCHC: 30.8 g/dL (ref 30.0–36.0)
MCV: 80.4 fL (ref 78.0–100.0)
Platelets: 486 10*3/uL — ABNORMAL HIGH (ref 150–400)
RBC: 4.24 MIL/uL (ref 3.87–5.11)
RDW: 19 % — ABNORMAL HIGH (ref 11.5–15.5)
WBC: 18.1 10*3/uL — ABNORMAL HIGH (ref 4.0–10.5)

## 2013-11-28 LAB — HEMOGLOBIN A1C
Hgb A1c MFr Bld: 5.6 %
Mean Plasma Glucose: 114 mg/dL

## 2013-11-28 LAB — GLUCOSE, CAPILLARY: Glucose-Capillary: 133 mg/dL — ABNORMAL HIGH (ref 70–99)

## 2013-11-28 MED ORDER — PANTOPRAZOLE SODIUM 40 MG PO TBEC: 40.0000 mg | DELAYED_RELEASE_TABLET | Freq: Every day | ORAL | Status: DC

## 2013-11-28 MED ORDER — INSULIN ASPART 100 UNIT/ML ~~LOC~~ SOLN
0.0000 [IU] | Freq: Three times a day (TID) | SUBCUTANEOUS | Status: DC
  Administered 2013-11-28: 1 [IU] via SUBCUTANEOUS

## 2013-11-28 MED ORDER — PREDNISONE 20 MG PO TABS: ORAL_TABLET | ORAL | Status: DC

## 2013-11-28 MED ORDER — BENZONATATE 200 MG PO CAPS: 200.0000 mg | ORAL_CAPSULE | Freq: Three times a day (TID) | ORAL | Status: DC | PRN

## 2013-11-28 MED ORDER — MOMETASONE FURO-FORMOTEROL FUM 100-5 MCG/ACT IN AERO: 2.0000 | INHALATION_SPRAY | Freq: Two times a day (BID) | RESPIRATORY_TRACT | Status: DC

## 2013-11-28 MED ORDER — IPRATROPIUM-ALBUTEROL 0.5-2.5 (3) MG/3ML IN SOLN: 3.0000 mL | Freq: Two times a day (BID) | RESPIRATORY_TRACT | Status: DC

## 2013-11-28 MED ORDER — MONTELUKAST SODIUM 10 MG PO TABS: 10.0000 mg | ORAL_TABLET | Freq: Every day | ORAL | Status: DC

## 2013-11-28 MED ORDER — OXYCODONE HCL 5 MG PO TABS: 5.0000 mg | ORAL_TABLET | ORAL | Status: DC | PRN

## 2013-11-28 MED ORDER — ALPRAZOLAM 2 MG PO TABS: 2.0000 mg | ORAL_TABLET | Freq: Three times a day (TID) | ORAL | Status: DC | PRN

## 2013-11-28 MED ORDER — MORPHINE SULFATE ER 30 MG PO TBCR: 30.0000 mg | EXTENDED_RELEASE_TABLET | Freq: Two times a day (BID) | ORAL | Status: DC

## 2013-11-28 MED ORDER — ALBUTEROL SULFATE (2.5 MG/3ML) 0.083% IN NEBU: 2.5000 mg | INHALATION_SOLUTION | RESPIRATORY_TRACT | Status: DC | PRN

## 2013-11-28 MED ORDER — LEVOFLOXACIN 750 MG PO TABS: 750.0000 mg | ORAL_TABLET | Freq: Every day | ORAL | Status: DC

## 2013-11-28 NOTE — Progress Notes (Signed)
Assessment unchanged. Discussed D/C instructions with pt including f/u appointments and medications.  RX given to pt. IV and tele removed.  Pt left via W/C accompanied by RN with belongings.

## 2013-11-28 NOTE — Progress Notes (Signed)
CSW followed up with patient regarding resources. Patient stated that she followed up with the resources and places are not able to help her at this time with emergency funds. CSW provided information for DSS to check and see if her Medicaid case worker can assist with financial assistance. Patient thanked Child psychotherapistsocial worker. There are no other social work needs at this time. Please re consult if social work needs arise.  Maree KrabbeLindsay Kendahl Bumgardner, MSW, Theresia MajorsLCSWA [phone]

## 2013-11-28 NOTE — Discharge Summary (Signed)
Physician Discharge Summary  Julie Kerr OZH:[id number] DOB: July 09, 1976 DOA: 11/25/2013  PCP: Lu Duffel, MD  Admit date: 11/25/2013 Discharge date: 11/28/2013  Time spent: 30 minutes  Recommendations for Outpatient Follow-up:  1. Check CBC in one week 2. Follow u p with PCP in one week 3. Follow up with pulmonary in one week as recommended.  4. Check TSH IN 4 TO 6 WEEKS.   Discharge Diagnoses:  Principal Problem:   Acute respiratory failure Active Problems:   COPD exacerbation   Chronic pain syndrome   Vocal cord dysfunction   Discharge Condition: improved  Diet recommendation: low sodium diet.   Filed Weights   11/26/13 0046  Weight: 83.915 kg (185 lb)    History of present illness:  Julie Kerr is a 38 y.o. female with history of COPD/asthma, previous history of tobacco abuse presents to the ER because of shortness of breath. Patient states she has been short of breath since yesterday morning with nonproductive cough and has been having pleuritic type of chest pain. In the ER patient was found to be wheezing and acutely short of breath and had received racemic epinephrine, Solu-Medrol nebulizer. Chest x-ray does not show anything acute. Patient has been admitted for further workup   Hospital Course:  1. Acute hypoxemic respiratory failure -Evidence by respiratory of 32, patient in respiratory distress, requiring racemic epinephrine  -Likely secondary to community acquire pneumonia with superimposed COPD exacerbation.  -Initial chest x-ray did not reveal acute infiltrates.  -she was started on , IV antibiotics, nebs, supplemental oxygen as needed. Over the next 48 hours she was  transitioned to oral steroids and taper over the next one week. She was discharged on dulera. . An appointment is made with PCP and pulmonologist next week. Recommend checking cbc to evaluate for resolution of the leukocytosis.  2. Chronic obstructive pulmonary disease exacerbation  -Likely  precipitated by underlying infectious process  -she was started on IV steroids, her exam today shows no wheezing, good air entry bilateral. She was  transitioned to oral steroids.  -Patient reports quitting smoking 7 months ago  3. Suspected community acquire pneumonia  -It is possible underlying infectious process precipitating COPD  -Continue Levaquin to complete the course.  -Flu swab was negative  -Followup on repeat chest x-ray shows clear chest , no infiltrates.  4. Abnormal TSH  -TSH at 0.324  -Likely related to acute illness and steroids  -Would recommend repeat TSH as an outpatient   Anemia:  Normocytic. Stable. Outpatient follow up.  Anxiety and panic attacks:  On xanax prn.    Procedures:  none  Consultations:  none  Discharge Exam: Filed Vitals:   11/28/13 0329  BP: 95/54  Pulse: 74  Temp: 98.3 F (36.8 C)  Resp: 18    General: alert afebrile comfortable Cardiovascular: s1s2 Respiratory: ctab  Discharge Instructions You were cared for by a hospitalist during your hospital stay. If you have any questions about your discharge medications or the care you received while you were in the hospital after you are discharged, you can call the unit and asked to speak with the hospitalist on call if the hospitalist that took care of you is not available. Once you are discharged, your primary care physician will handle any further medical issues. Please note that NO REFILLS for any discharge medications will be authorized once you are discharged, as it is imperative that you return to your primary care physician (or establish a relationship with a primary care physician if you  do not have one) for your aftercare needs so that they can reassess your need for medications and monitor your lab values.   Future Appointments Provider Department Dept Phone   12/04/2013 11:15 AM Nyoka Cowden, MD [facility] [phone]   12/05/2013 3:00 PM Holland Commons, NP [facility] [phone]       Medication List    STOP taking these medications       albuterol (5 MG/ML) 0.5% nebulizer solution  Commonly known as:  PROVENTIL  Replaced by:  albuterol (2.5 MG/3ML) 0.083% nebulizer solution     budesonide 0.25 MG/2ML nebulizer solution  Commonly known as:  PULMICORT     Oxycodone HCl 20 MG Tabs     promethazine-dextromethorphan 6.25-15 MG/5ML syrup  Commonly known as:  PROMETHAZINE-DM      TAKE these medications       albuterol 108 (90 BASE) MCG/ACT inhaler  Commonly known as:  PROVENTIL HFA;VENTOLIN HFA  Inhale 2 puffs into the lungs every 4 (four) hours as needed for wheezing.     albuterol (2.5 MG/3ML) 0.083% nebulizer solution  Commonly known as:  PROVENTIL  Take 3 mLs (2.5 mg total) by nebulization every 2 (two) hours as needed for wheezing.     alprazolam 2 MG tablet  Commonly known as:  XANAX  Take 1 tablet (2 mg total) by mouth 3 (three) times daily as needed for sleep.     benzonatate 200 MG capsule  Commonly known as:  TESSALON  Take 1 capsule (200 mg total) by mouth 3 (three) times daily as needed for cough.     ipratropium-albuterol 0.5-2.5 (3) MG/3ML Soln  Commonly known as:  DUONEB  Take 3 mLs by nebulization 2 (two) times daily.     levofloxacin 750 MG tablet  Commonly known as:  LEVAQUIN  Take 1 tablet (750 mg total) by mouth at bedtime.     mometasone-formoterol 100-5 MCG/ACT Aero  Commonly known as:  DULERA  Inhale 2 puffs into the lungs 2 (two) times daily.     montelukast 10 MG tablet  Commonly known as:  SINGULAIR  Take 1 tablet (10 mg total) by mouth daily.     morphine 30 MG 12 hr tablet  Commonly known as:  MS CONTIN  Take 1 tablet (30 mg total) by mouth every 12 (twelve) hours. NO refills, Refill only by primary MD     pantoprazole 40 MG tablet  Commonly known as:  PROTONIX  Take 1 tablet (40 mg total) by mouth daily.     predniSONE 20 MG tablet  Commonly known as:  DELTASONE   - Prednisone 40 mg daily for 3 days followed by  - Prednisone 20 mg daily for 3 days followed by  - Prednisone 10 mg daily for 3 days.     zolpidem 10 MG tablet  Commonly known as:  AMBIEN  Take 10 mg by mouth at bedtime.       Allergies  Allergen Reactions  . Robitussin Dm [Dextromethorphan-Guaifenesin] Nausea And Vomiting  . Nsaids Hives  . Tramadol Hives       Follow-up Information   Follow up with [facility] On 12/05/2013. (3:00   Thursday     Please bring photo ID and all medications you are currently taking.  )    Contact information:   [address] [place] [address] [phone]       The results of significant diagnostics  from this hospitalization (including imaging, microbiology, ancillary and laboratory) are listed below for reference.    Significant Diagnostic Studies: Dg Chest 2 View  11/27/2013   CLINICAL DATA:  Shortness of breath and chest pain  EXAM: CHEST  2 VIEW  COMPARISON:  November 25, 2013  FINDINGS: There is no edema or consolidation. Heart size and pulmonary vascularity are normal. No adenopathy. No bone lesions.  IMPRESSION: No edema or consolidation.   Electronically Signed   By: Bretta BangWilliam  Woodruff M.D.   On: 11/27/2013 07:55   Dg Hip Bilateral W/pelvis  11/28/2013   CLINICAL DATA:  Recent fall.  EXAM: BILATERAL HIP WITH PELVIS - 4+ VIEW  COMPARISON:  DG HIP BILATERAL W/PELVIS dated 10/29/2013  FINDINGS: Hips are located. Dedicated views of the left and right hips demonstrate no femoral neck fracture. Posterior lumbar fusion noted.  IMPRESSION: No acute osseous abnormality.   Electronically Signed   By: Genevive BiStewart  Edmunds M.D.   On: 11/28/2013 11:43   Dg Knee 1-2 Views Left  11/28/2013   CLINICAL DATA:  Fall, bilateral knee pain  EXAM: LEFT KNEE - 1-2 VIEW  COMPARISON:  None.  FINDINGS: Two views of left knee submitted. No acute fracture or subluxation. Minimal narrowing of medial joint compartment. No joint  effusion.  IMPRESSION: No acute fracture or subluxation. Minimal narrowing of medial joint compartment.   Electronically Signed   By: Natasha MeadLiviu  Pop M.D.   On: 11/28/2013 13:07   Dg Chest Portable 1 View  11/25/2013   CLINICAL DATA:  Shortness of breath, wheezing and cough.  EXAM: PORTABLE CHEST - 1 VIEW  COMPARISON:  DG CHEST 1V PORT dated 10/29/2013  FINDINGS: The heart size and mediastinal contours are within normal limits. There is no evidence of pulmonary edema, consolidation, pneumothorax, nodule or pleural fluid. The visualized skeletal structures are unremarkable.  IMPRESSION: No active disease.   Electronically Signed   By: Irish LackGlenn  Yamagata M.D.   On: 11/25/2013 20:30   Dg Foot 2 Views Right  10/30/2013   CLINICAL DATA:  Pain status post trauma  EXAM: RIGHT FOOT - 2 VIEW  COMPARISON:  None.  FINDINGS: There is no evidence of fracture or dislocation. There is no evidence of arthropathy or other focal bone abnormality. Soft tissues are unremarkable.  IMPRESSION: Negative.   Electronically Signed   By: Salome HolmesHector  Cooper M.D.   On: 10/30/2013 10:35    Microbiology: No results found for this or any previous visit (from the past 240 hour(s)).   Labs: Basic Metabolic Panel:  Recent Labs Lab 11/25/13 1945 11/26/13 0130 11/26/13 0500 11/27/13 0344  NA 140  --  140 142  K 4.0  --  4.1 4.2  CL 104  --  103 105  CO2 20  --  18* 21  GLUCOSE 117*  --  162* 216*  BUN 11  --  10 9  CREATININE 0.78 0.69 0.68 0.65  CALCIUM 10.0  --  9.7 10.1   Liver Function Tests: No results found for this basename: AST, ALT, ALKPHOS, BILITOT, PROT, ALBUMIN,  in the last 168 hours No results found for this basename: LIPASE, AMYLASE,  in the last 168 hours No results found for this basename: AMMONIA,  in the last 168 hours CBC:  Recent Labs Lab 11/25/13 1945 11/26/13 0130 11/26/13 0500 11/27/13 0344 11/28/13 0208  WBC 10.0 13.7* 13.8* 20.5* 18.1*  HGB 11.5* 10.3* 10.2* 10.2* 10.5*  HCT 35.8* 31.9* 32.0*  32.5* 34.1*  MCV 78.2 77.4* 77.7*  79.9 80.4  PLT 523* 466* 460* 477* 486*   Cardiac Enzymes:  Recent Labs Lab 11/26/13 0500 11/26/13 0930 11/26/13 1440  TROPONINI <0.30 <0.30 <0.30   BNP: BNP (last 3 results)  Recent Labs  09/02/13 2358 10/29/13 0220 11/26/13 0500  PROBNP <5.0 8.1 10.4   CBG:  Recent Labs Lab 11/25/13 2342 11/26/13 1122 11/28/13 1148  GLUCAP 168* 180* 133*       Signed:  Jazmyn Offner  [facility] 11/28/2013, 1:14 PM

## 2013-11-28 NOTE — Care Management Note (Signed)
    Page 1 of 1   11/28/2013     11:54:01 AM   CARE MANAGEMENT NOTE 11/28/2013  Patient:  Britt BoozerXXXTINLEY,Syndey   Account Number:  [id number]  Date Initiated:  11/28/2013  Documentation initiated by:  Phila Shoaf  Subjective/Objective Assessment:   PT ADM ON 4/13 WITH ACUTE HYPOXEMIC RESP FAILURE, COPD EXACERBATION.  PTA, PT INDEPENDENT OF ADLS.     Action/Plan:   PT HAS NO PCP.  APPT MADE AT [facility] FOR PCP FOLLOW UP.   APPT IS 4/23 AT 3:00PM.   Anticipated DC Date:  11/28/2013   Anticipated DC Plan:  HOME/SELF CARE  In-house referral  Clinical Social Worker      DC Planning Services  CM consult  PCP issues      Choice offered to / List presented to:             Status of service:  Completed, signed off Medicare Important Message given?   (If response is "NO", the following Medicare IM given date fields will be blank) Date Medicare IM given:   Date Additional Medicare IM given:    Discharge Disposition:  HOME/SELF CARE  Per UR Regulation:  Reviewed for med. necessity/level of care/duration of stay  If discussed at Long Length of Stay Meetings, dates discussed:    Comments:

## 2013-12-04 ENCOUNTER — Ambulatory Visit (INDEPENDENT_AMBULATORY_CARE_PROVIDER_SITE_OTHER): Payer: Medicaid Other | Admitting: Internal Medicine

## 2013-12-04 ENCOUNTER — Encounter: Payer: Self-pay | Admitting: Internal Medicine

## 2013-12-04 VITALS — BP 102/70 | HR 82 | Temp 98.7°F | Ht 62.0 in | Wt 190.8 lb

## 2013-12-04 DIAGNOSIS — J45901 Unspecified asthma with (acute) exacerbation: Secondary | ICD-10-CM

## 2013-12-04 DIAGNOSIS — R058 Other specified cough: Secondary | ICD-10-CM

## 2013-12-04 DIAGNOSIS — R059 Cough, unspecified: Secondary | ICD-10-CM

## 2013-12-04 DIAGNOSIS — R05 Cough: Secondary | ICD-10-CM

## 2013-12-04 MED ORDER — HYDROCOD POLST-CHLORPHEN POLST 10-8 MG/5ML PO LQCR: ORAL | Status: DC

## 2013-12-04 MED ORDER — PANTOPRAZOLE SODIUM 40 MG PO TBEC: 40.0000 mg | DELAYED_RELEASE_TABLET | Freq: Two times a day (BID) | ORAL | Status: DC

## 2013-12-04 MED ORDER — ALPRAZOLAM 2 MG PO TABS: ORAL_TABLET | ORAL | Status: DC

## 2013-12-04 NOTE — Progress Notes (Signed)
Subjective:    Patient ID: Julie Kerr, female    DOB: 07/17/1976    MRN: [id number]  HPI  [age] yobf with asthma since age 89 on inhalers daily quit smoking 08/2013 but if anything worse since then [facility]  Admit date: 11/25/2013  Discharge date: 11/28/2013 .  Discharge Diagnoses:  Acute respiratory failure   COPD exacerbation  Chronic pain syndrome  Vocal cord dysfunction  Discharge Condition: improved  Diet recommendation: low sodium diet.  Filed Weights    11/26/13 0046   Weight:  83.915 kg (185 lb)   History of present illness:    38 y.o. female with history of COPD/asthma, previous history of tobacco abuse presents to the ER because of shortness of breath. Patient states she has been short of breath since yesterday morning with nonproductive cough and has been having pleuritic type of chest pain. In the ER patient was found to be wheezing and acutely short of breath and had received racemic epinephrine, Solu-Medrol nebulizer. Chest x-ray does not show anything acute. Patient has been admitted for further workup  Hospital Course:  1. Acute hypoxemic respiratory failure -Evidence by respiratory of 32, patient in respiratory distress, requiring racemic epinephrine  -Likely secondary to community acquire pneumonia with superimposed COPD exacerbation.  -Initial chest x-ray did not reveal acute infiltrates.  -she was started on , IV antibiotics, nebs, supplemental oxygen as needed. Over the next 48 hours she was transitioned to oral steroids and taper over the next one week. She was discharged on dulera. . An appointment is made with PCP and pulmonologist next week. Recommend checking cbc to evaluate for resolution of the leukocytosis.  2. Chronic obstructive pulmonary disease exacerbation  -Likely precipitated by underlying infectious process  -she was started on IV steroids, her exam today shows no wheezing, good air entry bilateral. She was transitioned  to oral steroids.  -Patient reports quitting smoking 7 months ago  3. Suspected community acquire pneumonia  -It is possible underlying infectious process precipitating COPD  -Continue Levaquin to complete the course.  -Flu swab was negative  -Followup on repeat chest x-ray shows clear chest , no infiltrates.  4. Abnormal TSH  -TSH at 0.324  -Likely related to acute illness and steroids  -Would recommend repeat TSH as an outpatient  Anemia:  Normocytic. Stable. Outpatient follow up.  Anxiety and panic attacks:  On xanax prn    12/04/2013 1st [facility] office visit/ [name]  Chief Complaint  Patient presents with  . Pulmonary Consult    Referred per Dr. Blake DivineAkula. Pt reports dx of COPD approx 10 yrs ago. She c/o increased SOB and cough x 5 days. SOB bothers her all of the time- made worse by anxiety.  Cough is prod occ with minimal yellow/blood tinged sputum.  She is using rescue inhaler on average 6 x per day and using albuterol neb 4 x per day.  last did well for a couple of weeks while on "strong cough med with codeine" = tussionex per d/c instructions plus xanax - prednisone and inhalers have stopped working for her Last used neb around 1.5 h prior to OV      Review of Systems  Constitutional: Positive for unexpected weight change. Negative for fever and chills.  HENT: Positive for congestion, nosebleeds and sneezing. Negative for dental problem, ear pain, postnasal drip, rhinorrhea, sinus pressure, sore throat, trouble swallowing and voice change.   Eyes: Negative for visual disturbance.  Respiratory: Positive  for cough and shortness of breath. Negative for choking.   Cardiovascular: Positive for chest pain. Negative for leg swelling.  Gastrointestinal: Negative for vomiting, abdominal pain and diarrhea.  Genitourinary: Negative for difficulty urinating.       Heartburn   Musculoskeletal: Positive for arthralgias.  Skin: Negative for rash.  Neurological: Positive for  headaches. Negative for tremors and syncope.  Hematological: Does not bruise/bleed easily.       Objective:   Physical Exam  amb  Mod obese bf with severe pseudowheeze   Wt Readings from Last 3 Encounters:  12/04/13 190 lb 12.8 oz (86.546 kg)  11/26/13 185 lb (83.915 kg)  11/01/13 194 lb 0.1 oz (88 kg)     HEENT: nl dentition, turbinates, and orophanx. Nl external ear canals without cough reflex   NECK :  without JVD/Nodes/TM/ nl carotid upstrokes bilaterally   LUNGS: no acc muscle use, clear to A and P bilaterally without cough on insp or exp maneuvers   CV:  RRR  no s3 or murmur or increase in P2, no edema   ABD:  soft and nontender with nl excursion in the supine position. No bruits or organomegaly, bowel sounds nl  MS:  warm without deformities, calf tenderness, cyanosis or clubbing  SKIN: warm and dry without lesions    NEURO:  alert, approp, no deficits    cxr 11/27/13  There is no edema or consolidation. Heart size and pulmonary  vascularity are normal. No adenopathy. No bone lesions.      Assessment & Plan:

## 2013-12-04 NOTE — Patient Instructions (Signed)
Start dulera 100 Take 2 puffs first thing in am and then another 2 puffs about 12 hours later.    Work on inhaler technique:  relax and gently blow all the way out then take a nice smooth deep breath back in, triggering the inhaler at same time you start breathing in.  Hold for up to 5 seconds if you can.  Rinse and gargle with water when done  For cough > tussionex For nerves > xanax 2 mg For breathing only if can't catch breath after first suppressing the cough > duoneb  See Tammy NP w/in 1 weeks with all your medications, even over the counter meds, separated in two separate bags, the ones you take no matter what vs the ones you stop once you feel better and take only as needed when you feel you need them.   Tammy  will generate for you a new user friendly medication calendar that will put us all on the same page re: your medication use.     Without this process, it simply isn't possible to assure that we are providing  your outpatient care  with  the attention to detail we feel you deserve.   If we cannot assure that you're getting that kind of care,  then we cannot manage your problem effectively from this clinic.  Once you have seen Tammy and we are sure that we're all on the same page with your medication use she will arrange follow up with me.

## 2013-12-05 ENCOUNTER — Inpatient Hospital Stay: Payer: Medicaid Other | Admitting: Internal Medicine

## 2013-12-07 DIAGNOSIS — R05 Cough: Secondary | ICD-10-CM | POA: Insufficient documentation

## 2013-12-07 DIAGNOSIS — R058 Other specified cough: Secondary | ICD-10-CM | POA: Insufficient documentation

## 2013-12-07 NOTE — Assessment & Plan Note (Signed)
Classic Upper airway cough syndrome, so named because it's frequently impossible to sort out how much is  CR/sinusitis with freq throat clearing (which can be related to primary GERD)   vs  causing  secondary (" extra esophageal")  GERD from wide swings in gastric pressure that occur with throat clearing, often  promoting self use of mint and menthol lozenges that reduce the lower esophageal sphincter tone and exacerbate the problem further in a cyclical fashion.   These are the same pts (now being labeled as having "irritable larynx syndrome" by some cough centers) who not infrequently have a history of having failed to tolerate ace inhibitors,  dry powder inhalers or biphosphonates or report having atypical reflux symptoms that don't respond to standard doses of PPI , and are easily confused as having aecopd or asthma flares by even experienced allergists/ pulmonologists.   For now continue max rx for GERD and consider adding reglan if proves refractory/ narc dependent Avoid use of DPI's or high dose ICS which may trigger

## 2013-12-07 NOTE — Assessment & Plan Note (Signed)
DDX of  difficult airways managment all start with A and  include Adherence, Ace Inhibitors, Acid Reflux, Active Sinus Disease, Alpha 1 Antitripsin deficiency, Anxiety masquerading as Airways dz,  ABPA,  allergy(esp in young), Aspiration (esp in elderly), Adverse effects of DPI,  Active smokers, plus two Bs  = Bronchiectasis and Beta blocker use..and one C= CHF  Adherence is always the initial "prime suspect" and is a multilayered concern that requires a "trust but verify" approach in every patient - starting with knowing how to use medications, especially inhalers, correctly, keeping up with refills and understanding the fundamental difference between maintenance and prns vs those medications only taken for a very short course and then stopped and not refilled.  - The proper method of use, as well as anticipated side effects, of a metered-dose inhaler are discussed and demonstrated to the patient. Improved effectiveness after extensive coaching during this visit to a level of approximately  75% -  To keep things simple, I have asked the patient to first separate medicines that are perceived as maintenance, that is to be taken daily "no matter what", from those medicines that are taken on only on an as-needed basis and I have given the patient examples of both, and then return to see our NP to generate a  detailed  medication calendar which should be followed until the next physician sees the patient and updates it.    ? Acid (or non-acid) GERD > always difficult to exclude as up to 75% of pts in some series report no assoc GI/ Heartburn symptoms> rec max (24h)  acid suppression and diet restrictions/ reviewed and instructions given in writing.   ? Anxiety > typically dx of exclusion but note "xanax and tussionex are the only meds that work now" in hx > will try to wean off as improve airway control.  ? ACtive smoking > variably reports "last cigarette" date but denies active smoking now  See  instructions for specific recommendations which were reviewed directly with the patient who was given a copy with highlighter outlining the key components.

## 2013-12-11 ENCOUNTER — Encounter: Payer: Medicaid Other | Admitting: Adult Health

## 2014-01-15 ENCOUNTER — Ambulatory Visit: Payer: Medicaid Other | Admitting: Adult Health

## 2014-01-15 ENCOUNTER — Telehealth: Payer: Self-pay | Admitting: Pulmonary Disease

## 2014-01-15 ENCOUNTER — Telehealth: Payer: Self-pay | Admitting: Emergency Medicine

## 2014-01-15 NOTE — Telephone Encounter (Signed)
I received a request from answering service to call Ms Lausier back. I attempted to reach her at the number given and was informed by the person that answered the phone that she was not at home. I was given Ms Prashad's mobile number and called that. She answered on the second call and explained that she desperately needed refills on her Dulera and Xanax. I have reviewed her records and confirmed that these medications have been previously prescribed to her by Dr Sherene Sires. Notes indicate that she had called the office earlier in the day to request refills but they were not provided. She sounds extremely anxious over the phone and her records indicate that she has been intubated in the past for anxiety/VCD.   I called into the [facility] the following  1) Dulera 100/5 - 2 actuations q 12 hrs. One inhaler. No refills 2) Xanax 2 mg # 15, no refills. One PO q 6 hrs PRN   I explained that this is an unacceptable way for her to get her medications in the future as it puts me in an awkward position having never seen her before. She is to call the office tomorrow and schedule an appointment   Billy Fischer, MD ; [facility] service Mobile [phone].  After 5:30 PM or weekends, call [phone]

## 2014-01-15 NOTE — Telephone Encounter (Signed)
Pt states that she missed appt today due to her car breaking down--had no form of transportation to get here. Pt states that she became very anxious because she needed this appt to get refills of her medications.  Pt c/o chest tightness, SOB (used neb--improved) feels she is having panic attack. Patient has used nebs and her SOB improved but still having the chest tightness. Pt does not have albuterol hfa - ran out, needs refill. Explained to the patient that the chest tightness she is having is more than likely associated with her anxiety. Advised patient to calm down, take a few deep breaths and try to relax. Pt states that she has been doing this for a little while now and it is not improving. Pt did not sound like she was in any distress on the phone- no SOB. Pt very emotional.  Needs refill of Albuterol HFA, Dulera 100 and Alprazolam 2mg  [facility]  Pt will have to check with transportation before making appt with TP/RB--will call us back.  In the meantime, patient has requested that we get recs per TP ASAP-- should she go to ED? Or will this subside?

## 2014-01-15 NOTE — Telephone Encounter (Signed)
Spoke with Ashok Cordia- pt family member. Pt not available to speak at the time.  Apologized for the mix up in Drs. Message relayed to Dr Shelle Iron per [facility] and medication refill refused.  Aware that Dr Sherene Sires no in office-- MW is the original prescribing physician for this medication. Aware to contact PCP to get refill.   Expressed understanding. Will call back if anything further needed.

## 2014-01-15 NOTE — Telephone Encounter (Signed)
Per TP:  Refill Albuterol HFA and Dulera 100 [facility]  As for the Xanax rx refill-- defer to Dr Delton Coombes to approve.  Pt states that she does not want to go to the ED but she is having trouble getting her chest tightness and "panicky" feeling under control.  Please advise Dr Inis Sizer. (see message to TP below) Any recs for pt or okay to fill Xanax. Thanks.

## 2014-01-16 NOTE — Telephone Encounter (Signed)
Called pt LMOM x 1 for pt to contact office back ASAP to schedule appt per MW

## 2014-01-16 NOTE — Telephone Encounter (Signed)
This pt was supposed to see tammy in early May for med rec and has not returned.  Schedule this now and if refuses then we need consider discharging her from our care.

## 2014-01-19 ENCOUNTER — Encounter (HOSPITAL_COMMUNITY): Payer: Self-pay | Admitting: Emergency Medicine

## 2014-01-19 ENCOUNTER — Inpatient Hospital Stay (HOSPITAL_COMMUNITY)
Admission: EM | Admit: 2014-01-19 | Discharge: 2014-01-20 | DRG: 189 | Discharge status: Against Medical Advice | Payer: Medicaid Other | Attending: Internal Medicine | Admitting: Internal Medicine

## 2014-01-19 ENCOUNTER — Emergency Department (HOSPITAL_COMMUNITY): Payer: Medicaid Other

## 2014-01-19 DIAGNOSIS — Z981 Arthrodesis status: Secondary | ICD-10-CM

## 2014-01-19 DIAGNOSIS — Z823 Family history of stroke: Secondary | ICD-10-CM

## 2014-01-19 DIAGNOSIS — K219 Gastro-esophageal reflux disease without esophagitis: Secondary | ICD-10-CM | POA: Diagnosis present

## 2014-01-19 DIAGNOSIS — J45901 Unspecified asthma with (acute) exacerbation: Secondary | ICD-10-CM

## 2014-01-19 DIAGNOSIS — R0989 Other specified symptoms and signs involving the circulatory and respiratory systems: Secondary | ICD-10-CM

## 2014-01-19 DIAGNOSIS — F3289 Other specified depressive episodes: Secondary | ICD-10-CM | POA: Diagnosis present

## 2014-01-19 DIAGNOSIS — J383 Other diseases of vocal cords: Secondary | ICD-10-CM

## 2014-01-19 DIAGNOSIS — J9600 Acute respiratory failure, unspecified whether with hypoxia or hypercapnia: Principal | ICD-10-CM

## 2014-01-19 DIAGNOSIS — Z634 Disappearance and death of family member: Secondary | ICD-10-CM

## 2014-01-19 DIAGNOSIS — R05 Cough: Secondary | ICD-10-CM | POA: Diagnosis present

## 2014-01-19 DIAGNOSIS — Z836 Family history of other diseases of the respiratory system: Secondary | ICD-10-CM

## 2014-01-19 DIAGNOSIS — F410 Panic disorder [episodic paroxysmal anxiety] without agoraphobia: Secondary | ICD-10-CM

## 2014-01-19 DIAGNOSIS — R061 Stridor: Secondary | ICD-10-CM

## 2014-01-19 DIAGNOSIS — G894 Chronic pain syndrome: Secondary | ICD-10-CM | POA: Diagnosis present

## 2014-01-19 DIAGNOSIS — Z609 Problem related to social environment, unspecified: Secondary | ICD-10-CM

## 2014-01-19 DIAGNOSIS — R058 Other specified cough: Secondary | ICD-10-CM

## 2014-01-19 DIAGNOSIS — R059 Cough, unspecified: Secondary | ICD-10-CM

## 2014-01-19 DIAGNOSIS — Z8249 Family history of ischemic heart disease and other diseases of the circulatory system: Secondary | ICD-10-CM

## 2014-01-19 DIAGNOSIS — Z801 Family history of malignant neoplasm of trachea, bronchus and lung: Secondary | ICD-10-CM

## 2014-01-19 DIAGNOSIS — F411 Generalized anxiety disorder: Secondary | ICD-10-CM

## 2014-01-19 DIAGNOSIS — Z87891 Personal history of nicotine dependence: Secondary | ICD-10-CM

## 2014-01-19 DIAGNOSIS — D649 Anemia, unspecified: Secondary | ICD-10-CM | POA: Diagnosis present

## 2014-01-19 DIAGNOSIS — Z79899 Other long term (current) drug therapy: Secondary | ICD-10-CM

## 2014-01-19 DIAGNOSIS — R0609 Other forms of dyspnea: Secondary | ICD-10-CM

## 2014-01-19 DIAGNOSIS — J209 Acute bronchitis, unspecified: Secondary | ICD-10-CM

## 2014-01-19 DIAGNOSIS — J441 Chronic obstructive pulmonary disease with (acute) exacerbation: Secondary | ICD-10-CM

## 2014-01-19 DIAGNOSIS — Z825 Family history of asthma and other chronic lower respiratory diseases: Secondary | ICD-10-CM

## 2014-01-19 DIAGNOSIS — R0603 Acute respiratory distress: Secondary | ICD-10-CM

## 2014-01-19 DIAGNOSIS — F329 Major depressive disorder, single episode, unspecified: Secondary | ICD-10-CM | POA: Diagnosis present

## 2014-01-19 DIAGNOSIS — J440 Chronic obstructive pulmonary disease with acute lower respiratory infection: Secondary | ICD-10-CM | POA: Diagnosis present

## 2014-01-19 LAB — BASIC METABOLIC PANEL
BUN: 11 mg/dL (ref 6–23)
CO2: 19 mEq/L (ref 19–32)
Calcium: 9.7 mg/dL (ref 8.4–10.5)
Chloride: 104 mEq/L (ref 96–112)
Creatinine, Ser: 0.78 mg/dL (ref 0.50–1.10)
GFR calc non Af Amer: >90 mL/min (ref >90)
GLUCOSE: 102 mg/dL — AB (ref 70–99)
POTASSIUM: 4.2 meq/L (ref 3.7–5.3)
Sodium: 139 mEq/L (ref 137–147)

## 2014-01-19 LAB — CBC WITH DIFFERENTIAL/PLATELET
BASOS PCT: 1 % (ref 0–1)
Basophils Absolute: 0 10*3/uL (ref 0.0–0.1)
Eosinophils Absolute: 0 10*3/uL (ref 0.0–0.7)
Eosinophils Relative: 0 % (ref 0–5)
HCT: 35.5 % — ABNORMAL LOW (ref 36.0–46.0)
HEMOGLOBIN: 11.5 g/dL — AB (ref 12.0–15.0)
LYMPHS ABS: 2.5 10*3/uL (ref 0.7–4.0)
Lymphocytes Relative: 31 % (ref 12–46)
MCH: 24.3 pg — AB (ref 26.0–34.0)
MCHC: 32.4 g/dL (ref 30.0–36.0)
MCV: 75.1 fL — ABNORMAL LOW (ref 78.0–100.0)
MONOS PCT: 5 % (ref 3–12)
Monocytes Absolute: 0.4 10*3/uL (ref 0.1–1.0)
NEUTROS ABS: 5.2 10*3/uL (ref 1.7–7.7)
NEUTROS PCT: 63 % (ref 43–77)
Platelets: 404 10*3/uL — ABNORMAL HIGH (ref 150–400)
RBC: 4.73 MIL/uL (ref 3.87–5.11)
RDW: 17.6 % — ABNORMAL HIGH (ref 11.5–15.5)
WBC: 8.1 10*3/uL (ref 4.0–10.5)

## 2014-01-19 MED ORDER — ALBUTEROL SULFATE (2.5 MG/3ML) 0.083% IN NEBU
5.0000 mg | INHALATION_SOLUTION | Freq: Once | RESPIRATORY_TRACT | Status: AC
  Administered 2014-01-19: 5 mg via RESPIRATORY_TRACT
  Filled 2014-01-19: qty 6

## 2014-01-19 MED ORDER — OXYCODONE HCL 5 MG PO TABS
20.0000 mg | ORAL_TABLET | ORAL | Status: DC | PRN
  Administered 2014-01-19 – 2014-01-20 (×4): 20 mg via ORAL
  Filled 2014-01-19 (×4): qty 4

## 2014-01-19 MED ORDER — ALBUTEROL SULFATE (2.5 MG/3ML) 0.083% IN NEBU
2.5000 mg | INHALATION_SOLUTION | RESPIRATORY_TRACT | Status: DC
  Administered 2014-01-19: 2.5 mg via RESPIRATORY_TRACT
  Filled 2014-01-19 (×2): qty 3

## 2014-01-19 MED ORDER — ENOXAPARIN SODIUM 40 MG/0.4ML ~~LOC~~ SOLN
40.0000 mg | SUBCUTANEOUS | Status: DC
  Administered 2014-01-19 – 2014-01-20 (×2): 40 mg via SUBCUTANEOUS
  Filled 2014-01-19 (×2): qty 0.4

## 2014-01-19 MED ORDER — BENZONATATE 100 MG PO CAPS
200.0000 mg | ORAL_CAPSULE | Freq: Three times a day (TID) | ORAL | Status: DC | PRN
  Filled 2014-01-19: qty 2

## 2014-01-19 MED ORDER — SODIUM CHLORIDE 0.9 % IV SOLN
INTRAVENOUS | Status: DC
  Administered 2014-01-19 – 2014-01-20 (×2): via INTRAVENOUS

## 2014-01-19 MED ORDER — PANTOPRAZOLE SODIUM 40 MG PO TBEC
40.0000 mg | DELAYED_RELEASE_TABLET | Freq: Every day | ORAL | Status: DC
  Administered 2014-01-19: 40 mg via ORAL
  Filled 2014-01-19: qty 1

## 2014-01-19 MED ORDER — ALBUTEROL SULFATE HFA 108 (90 BASE) MCG/ACT IN AERS: 2.0000 | INHALATION_SPRAY | RESPIRATORY_TRACT | Status: DC | PRN

## 2014-01-19 MED ORDER — ACETAMINOPHEN 325 MG PO TABS
650.0000 mg | ORAL_TABLET | Freq: Four times a day (QID) | ORAL | Status: DC | PRN
Start: 2014-01-19 — End: 2014-01-20

## 2014-01-19 MED ORDER — IPRATROPIUM BROMIDE 0.02 % IN SOLN
0.5000 mg | Freq: Once | RESPIRATORY_TRACT | Status: AC
  Administered 2014-01-19: 0.5 mg via RESPIRATORY_TRACT
  Filled 2014-01-19: qty 2.5

## 2014-01-19 MED ORDER — ALBUTEROL SULFATE (2.5 MG/3ML) 0.083% IN NEBU
5.0000 mg | INHALATION_SOLUTION | Freq: Once | RESPIRATORY_TRACT | Status: DC
  Filled 2014-01-19: qty 6

## 2014-01-19 MED ORDER — ALBUTEROL SULFATE (2.5 MG/3ML) 0.083% IN NEBU
2.5000 mg | INHALATION_SOLUTION | Freq: Three times a day (TID) | RESPIRATORY_TRACT | Status: DC
  Administered 2014-01-20: 2.5 mg via RESPIRATORY_TRACT
  Filled 2014-01-19 (×2): qty 3

## 2014-01-19 MED ORDER — ACETAMINOPHEN 650 MG RE SUPP
650.0000 mg | Freq: Four times a day (QID) | RECTAL | Status: DC | PRN
Start: 2014-01-19 — End: 2014-01-20

## 2014-01-19 MED ORDER — ALUM & MAG HYDROXIDE-SIMETH 200-200-20 MG/5ML PO SUSP: 30.0000 mL | Freq: Four times a day (QID) | ORAL | Status: DC | PRN

## 2014-01-19 MED ORDER — MORPHINE SULFATE 4 MG/ML IJ SOLN
6.0000 mg | Freq: Once | INTRAMUSCULAR | Status: AC
  Administered 2014-01-19: 6 mg via INTRAVENOUS
  Filled 2014-01-19: qty 2

## 2014-01-19 MED ORDER — GI COCKTAIL ~~LOC~~
30.0000 mL | Freq: Once | ORAL | Status: AC
  Administered 2014-01-19: 30 mL via ORAL
  Filled 2014-01-19: qty 30

## 2014-01-19 MED ORDER — METHYLPREDNISOLONE SODIUM SUCC 125 MG IJ SOLR
125.0000 mg | Freq: Once | INTRAMUSCULAR | Status: AC
  Administered 2014-01-19: 125 mg via INTRAVENOUS
  Filled 2014-01-19: qty 2

## 2014-01-19 MED ORDER — LORAZEPAM 2 MG/ML IJ SOLN
1.0000 mg | Freq: Once | INTRAMUSCULAR | Status: AC
  Administered 2014-01-19: 1 mg via INTRAVENOUS
  Filled 2014-01-19: qty 1

## 2014-01-19 MED ORDER — LIDOCAINE HCL (PF) 2 % IJ SOLN
3.0000 mL | INTRAMUSCULAR | Status: AC
  Administered 2014-01-19: 11:00:00
  Filled 2014-01-19: qty 10

## 2014-01-19 MED ORDER — SODIUM CHLORIDE 0.9 % IJ SOLN
3.0000 mL | Freq: Two times a day (BID) | INTRAMUSCULAR | Status: DC
  Administered 2014-01-19 (×2): 3 mL via INTRAVENOUS

## 2014-01-19 MED ORDER — MORPHINE SULFATE ER 15 MG PO TBCR
30.0000 mg | EXTENDED_RELEASE_TABLET | Freq: Two times a day (BID) | ORAL | Status: DC
  Administered 2014-01-19: 30 mg via ORAL
  Filled 2014-01-19 (×2): qty 2

## 2014-01-19 MED ORDER — ALBUTEROL SULFATE (2.5 MG/3ML) 0.083% IN NEBU
2.5000 mg | INHALATION_SOLUTION | RESPIRATORY_TRACT | Status: DC | PRN
  Administered 2014-01-20: 2.5 mg via RESPIRATORY_TRACT

## 2014-01-19 MED ORDER — LEVOFLOXACIN IN D5W 750 MG/150ML IV SOLN
750.0000 mg | INTRAVENOUS | Status: DC
  Administered 2014-01-19 – 2014-01-20 (×2): 750 mg via INTRAVENOUS
  Filled 2014-01-19 (×2): qty 150

## 2014-01-19 MED ORDER — ONDANSETRON HCL 4 MG/2ML IJ SOLN: 4.0000 mg | Freq: Four times a day (QID) | INTRAMUSCULAR | Status: DC | PRN

## 2014-01-19 MED ORDER — ONDANSETRON HCL 4 MG PO TABS: 4.0000 mg | ORAL_TABLET | Freq: Four times a day (QID) | ORAL | Status: DC | PRN

## 2014-01-19 MED ORDER — HYDROCOD POLST-CHLORPHEN POLST 10-8 MG/5ML PO LQCR
5.0000 mL | Freq: Once | ORAL | Status: AC
  Administered 2014-01-19: 5 mL via ORAL
  Filled 2014-01-19: qty 5

## 2014-01-19 MED ORDER — RACEPINEPHRINE HCL 2.25 % IN NEBU
0.5000 mL | INHALATION_SOLUTION | RESPIRATORY_TRACT | Status: DC | PRN
  Filled 2014-01-19: qty 0.5

## 2014-01-19 MED ORDER — ALPRAZOLAM 0.5 MG PO TABS
2.0000 mg | ORAL_TABLET | Freq: Four times a day (QID) | ORAL | Status: DC | PRN
  Administered 2014-01-19 – 2014-01-20 (×3): 2 mg via ORAL
  Filled 2014-01-19 (×3): qty 4

## 2014-01-19 MED ORDER — MOMETASONE FURO-FORMOTEROL FUM 100-5 MCG/ACT IN AERO
2.0000 | INHALATION_SPRAY | Freq: Two times a day (BID) | RESPIRATORY_TRACT | Status: DC
  Administered 2014-01-19 – 2014-01-20 (×2): 2 via RESPIRATORY_TRACT
  Filled 2014-01-19: qty 8.8

## 2014-01-19 MED ORDER — ALBUTEROL SULFATE (2.5 MG/3ML) 0.083% IN NEBU: 5.0000 mg | INHALATION_SOLUTION | RESPIRATORY_TRACT | Status: DC | PRN

## 2014-01-19 MED ORDER — METHYLPREDNISOLONE SODIUM SUCC 125 MG IJ SOLR
80.0000 mg | Freq: Three times a day (TID) | INTRAMUSCULAR | Status: DC
  Administered 2014-01-19: 80 mg via INTRAVENOUS
  Administered 2014-01-19: 21:00:00 via INTRAVENOUS
  Administered 2014-01-20: 80 mg via INTRAVENOUS
  Filled 2014-01-19 (×6): qty 1.28

## 2014-01-19 NOTE — ED Provider Notes (Signed)
CSN: [id number]     Arrival date & time 01/19/14  [id number] History   First MD Initiated Contact with Patient 01/19/14 0920     Chief Complaint  Patient presents with  . Cough     (Consider location/radiation/quality/duration/timing/severity/associated sxs/prior Treatment) HPI Julie Kerr is a 38 y.o. female who presents to ED with complaint of cough and shortness of breath. Pt with history of the same. Has a diagnosis of asthma and vocal cord dysfunction, followed by Dr. Sherene SiresWert. States began coughing yesterday, symptoms progressively worsened. States today unable to speak or breathe, coughing is constant. Took a breathing treatment at home and an inhaler with no relief. Multiple admissions for the same. Nothing making her symptoms better or worse.   Past Medical History  Diagnosis Date  . Asthma   . Anxiety   . COPD (chronic obstructive pulmonary disease)   . Bronchitis    Past Surgical History  Procedure Laterality Date  . Spinal fusion     Family History  Problem Relation Age of Onset  . HIV Mother   . Heart disease Father   . CVA Father   . Heart disease Other   . Emphysema Maternal Grandmother     smoked  . Asthma Sister   . Clotting disorder Sister   . Clotting disorder Maternal Grandmother   . Lung cancer Maternal Grandmother     smoked   History  Substance Use Topics  . Smoking status: Former Smoker -- 2.00 packs/day for 17 years    Types: Cigarettes    Quit date: 08/16/2013  . Smokeless tobacco: Never Used  . Alcohol Use: Yes     Comment: occasional   OB History   Grav Para Term Preterm Abortions TAB SAB Ect Mult Living                 Review of Systems  Constitutional: Negative for fever and chills.  Respiratory: Positive for cough, chest tightness, shortness of breath, wheezing and stridor.   Cardiovascular: Positive for chest pain. Negative for palpitations and leg swelling.  Gastrointestinal: Negative for nausea, vomiting, abdominal pain and diarrhea.   Genitourinary: Negative for dysuria, flank pain, vaginal bleeding, vaginal discharge, vaginal pain and pelvic pain.  Musculoskeletal: Negative for arthralgias, myalgias, neck pain and neck stiffness.  Skin: Negative for rash.  Neurological: Negative for dizziness, weakness and headaches.  Psychiatric/Behavioral: The patient is nervous/anxious.   All other systems reviewed and are negative.     Allergies  Robitussin dm; Nsaids; and Tramadol  Home Medications   Prior to Admission medications   Medication Sig Start Date End Date Taking? Authorizing Provider  albuterol (PROVENTIL HFA;VENTOLIN HFA) 108 (90 BASE) MCG/ACT inhaler Inhale 2 puffs into the lungs every 4 (four) hours as needed for wheezing. 08/06/13   Maryann Mikhail, DO  albuterol (PROVENTIL) (2.5 MG/3ML) 0.083% nebulizer solution Take 3 mLs (2.5 mg total) by nebulization every 2 (two) hours as needed for wheezing. 11/28/13   Kathlen ModyVijaya Akula, MD  alprazolam Prudy Feeler(XANAX) 2 MG tablet One every 6 hours as needed for nerves 12/04/13   Nyoka CowdenMichael B Wert, MD  chlorpheniramine-HYDROcodone [facility](TUSSIONEX [facility]) 10-8 MG/5ML LQCR One tsp every 12 hours as needed 12/04/13   Nyoka CowdenMichael B Wert, MD  ipratropium-albuterol (DUONEB) 0.5-2.5 (3) MG/3ML SOLN Take 3 mLs by nebulization 2 (two) times daily. 11/28/13   Kathlen ModyVijaya Akula, MD  mometasone-formoterol (DULERA) 100-5 MCG/ACT AERO Inhale 2 puffs into the lungs 2 (two) times daily. 11/28/13   Kathlen ModyVijaya Akula, MD  montelukast (  SINGULAIR) 10 MG tablet Take 1 tablet (10 mg total) by mouth daily. 11/28/13   Kathlen Mody, MD  morphine (MS CONTIN) 30 MG 12 hr tablet Take 1 tablet (30 mg total) by mouth every 12 (twelve) hours. NO refills, Refill only by primary MD 11/28/13   Kathlen Mody, MD  oxyCODONE (ROXICODONE) 5 MG immediate release tablet Take 1 tablet (5 mg total) by mouth every 4 (four) hours as needed for severe pain. 11/28/13   Kathlen Mody, MD  pantoprazole (PROTONIX) 40 MG tablet Take 1 tablet (40 mg total) by  mouth 2 (two) times daily before a meal. 12/04/13   Nyoka Cowden, MD  zolpidem (AMBIEN) 10 MG tablet Take 10 mg by mouth at bedtime.    Historical Provider, MD   Pulse 108  Temp(Src) 99 F (37.2 C) (Oral)  SpO2 98% Physical Exam  Nursing note and vitals reviewed. Constitutional: She appears well-developed and well-nourished. No distress.  HENT:  Head: Normocephalic.  Mouth/Throat: Oropharynx is clear and moist.  Eyes: Conjunctivae are normal.  Neck: Neck supple.  Cardiovascular: Normal rate, regular rhythm and normal heart sounds.   Pulmonary/Chest: Effort normal. No respiratory distress. She has wheezes. She has no rales.  Stridor present, pt coughing constantly  Abdominal: Soft. Bowel sounds are normal. She exhibits no distension. There is no tenderness. There is no rebound.  Musculoskeletal: She exhibits no edema.  Neurological: She is alert.  Skin: Skin is warm and dry.  Psychiatric: She has a normal mood and affect. Her behavior is normal.    ED Course  Procedures (including critical care time) Labs Review Labs Reviewed  CBC WITH DIFFERENTIAL - Abnormal; Notable for the following:    Hemoglobin 11.5 (*)    HCT 35.5 (*)    MCV 75.1 (*)    MCH 24.3 (*)    RDW 17.6 (*)    Platelets 404 (*)    All other components within normal limits  BASIC METABOLIC PANEL - Abnormal; Notable for the following:    Glucose, Bld 102 (*)    All other components within normal limits    Imaging Review Dg Chest Portable 1 View  01/19/2014   CLINICAL DATA:  Cough.  COPD.  Asthma and anxiety.  EXAM: PORTABLE CHEST - 1 VIEW  COMPARISON:  11/27/2013  FINDINGS: Numerous leads and wires project over the chest. Midline trachea. Normal heart size and mediastinal contours. No pleural effusion or pneumothorax. Mildly low lung volumes. Clear lungs.  IMPRESSION: Normal chest.   Electronically Signed   By: Jeronimo Greaves M.D.   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 Thurston Hole notes. Will get a neb started. Cough suppressant - tussionex ordered. Will try ativan.  GI cocktail. Labs and CXR pending. Pt maintaining oxygen saturation in high 90s    No improvement with above medications. Tried albuterol with lidocaine neb with "some soothing" but pt continues to cough and be SOB. Tried more ativan and morphine. Also tried breathing through the straw, which pt cannot do. Pt states "i have a broken screw in my back and it hurts when I cough."  Will get pt admitted for monitoring and further treatment.   Spoke with [facility], will admit.   Filed Vitals:   01/19/14 1030 01/19/14 1045 01/19/14 1121 01/19/14  1219  BP: 104/76 113/81  111/64  Pulse: 101  91 92  Temp:      TempSrc:      Resp: 20 20 17 22   SpO2: 100%  99% 100%     Lottie Mussel, PA-C 01/19/14 1302  Kaylynne Andres A Essance Gatti, PA-C 01/19/14 1302

## 2014-01-19 NOTE — ED Notes (Signed)
Shes been SOB with dry cough since last night. She has a hx of asthma and COPD and is not getting any relief with her inhalers. She denies pain.

## 2014-01-19 NOTE — H&P (Signed)
[facility] History and Physical  Adele Schildermanda Branagan ZOX:[id number] DOB: 02/10/1976 DOA: 01/19/2014  Referring physician: ED PCP: Lu DuffelWELLS,WENDELL, MD   Chief Complaint:  Progressive shortness of breath with cough for 2 weeks   HPI:  38 year old female with history of COPD/asthma , previous history of tobacco use, anxiety and panic attack symptoms with  upper airway cough syndrome and vocal cord dysfunction , chronic lumbar radiculopathy who was admitted 3 times already this year for resp failure. 6 weeks back she was admitted  for acute hypoxic respiratory failure likely in the setting of community acquired pneumonia / COPD exacerbation requiring steroids, nebs , racemic epinephrine and abx. She was discharged on  dulera and oral steroid. Patient reports that since her discharge she has not completely recovered and continued to have some cough and wheezing. However for the past 2 weeks he has worsening of her symptoms of cough, wheezing and shortness of breath as she's run out of her medications and could not afford them. Patient also has history of chronic low back pain with lumbar radiculopathy for which she underwent surgery in [place] about 5 weeks back. She had started to follow up at pain clinic but her doctor left and hence she also running out of her pain medications. Patient reports fever of 101 degrees Fahrenheit at home yesterday. She was unable to speak properly and had persistent cough. She took a breathing treatment at home without any relief. She denies any phlegm. Patient denies any headache, dizziness, blurred vision, nausea, vomiting, chest pain, palpitations, abdominal pain, bowel or urinary symptoms. Denies any sick contacts or recent travel .  Course in the ED Patient was tachycardic and tachypneic. She was diffusely wheezy on exam. Blood work was unremarkable. Chest x-ray was normal. She was given several rounds of neb followed by neb with lidocaine  with minimal relief. She was  given Ativan and morphine, GI cocktail and hospitalists called for admission to telemetry.   Review of Systems:  Constitutional: Denies fever, chills, denies diaphoresis, appetite change and fatigue.  HEENT: Denies photophobia, eye pain,  ear pain, congestion, sore throat, rhinorrhea, sneezing, mouth sores, trouble swallowing, neck pain,    Respiratory: SOB, DOE, cough, wheezing  Cardiovascular: Denies chest pain, palpitations and leg swelling.  Gastrointestinal: Denies nausea, vomiting, abdominal pain, diarrhea, constipation, blood in stool and abdominal distention.  Genitourinary: Denies dysuria, urgency, frequency, hematuria, flank pain and difficulty urinating.  Endocrine: Denies: hot or cold intolerance,  polyuria, polydipsia. Musculoskeletal: back and leg pains, Denies  joint swelling, arthralgias and gait problem.  Skin: Denies pallor, rash and wound.  Neurological: Denies dizziness, seizures, syncope, weakness, light-headedness, numbness and headaches.  Psychiatry: Increased anxiety,   Past Medical History  Diagnosis Date  . Asthma   . Anxiety   . COPD (chronic obstructive pulmonary disease)   . Bronchitis    Past Surgical History  Procedure Laterality Date  . Spinal fusion     Social History:  reports that she quit smoking about 5 months ago. Her smoking use included Cigarettes. She has a 34 pack-year smoking history. She has never used smokeless tobacco. She reports that she drinks alcohol. She reports that she does not use illicit drugs.  Allergies  Allergen Reactions  . Robitussin Dm [Dextromethorphan-Guaifenesin] Nausea And Vomiting  . Rayon, Purified Hives  . Nsaids Hives  . Tramadol Hives    Family History  Problem Relation Age of Onset  . HIV Mother   . Heart disease Father   . CVA Father   .  Heart disease Other   . Emphysema Maternal Grandmother     smoked  . Asthma Sister   . Clotting disorder Sister   . Clotting disorder Maternal Grandmother   . Lung  cancer Maternal Grandmother     smoked    Prior to Admission medications   Medication Sig Start Date End Date Taking? Authorizing Provider  albuterol (PROVENTIL HFA;VENTOLIN HFA) 108 (90 BASE) MCG/ACT inhaler Inhale 2 puffs into the lungs every 4 (four) hours as needed for wheezing. 08/06/13  Yes Maryann Mikhail, DO  albuterol (PROVENTIL) (2.5 MG/3ML) 0.083% nebulizer solution Take 3 mLs (2.5 mg total) by nebulization every 2 (two) hours as needed for wheezing. 11/28/13  Yes Kathlen Mody, MD  alprazolam Prudy Feeler) 2 MG tablet One every 6 hours as needed for nerves 12/04/13  Yes Nyoka Cowden, MD  mometasone-formoterol (DULERA) 100-5 MCG/ACT AERO Inhale 2 puffs into the lungs 2 (two) times daily. 11/28/13  Yes Kathlen Mody, MD  morphine (MS CONTIN) 30 MG 12 hr tablet Take 1 tablet (30 mg total) by mouth every 12 (twelve) hours. NO refills, Refill only by primary MD 11/28/13  Yes Kathlen Mody, MD  Oxycodone HCl 20 MG TABS Take 20 mg by mouth every 4 (four) hours as needed (for breakthrough pain).   Yes Historical Provider, MD  PRESCRIPTION MEDICATION Take 30 mLs by mouth every 4 (four) hours as needed (cough). Prescription cough syrup "Activi"   Yes Historical Provider, MD     Physical Exam:  Filed Vitals:   01/19/14 1121 01/19/14 1219 01/19/14 1300 01/19/14 1349  BP:  111/64 125/75 120/66  Pulse: 91 92 104 113  Temp:    98.9 F (37.2 C)  TempSrc:    Oral  Resp: 17 22 15 18   Height:    5\' 2"  (1.575 m)  SpO2: 99% 100% 96% 99%    Constitutional: Vital signs reviewed.  Patient is a middle aged female lying in bed anxious and tearful HEENT: no pallor, no icterus, moist oral mucosa, no cervical lymphadenopathy, hoarse voice Cardiovascular: S1 and S2 tachycardic, no MRG Chest: Diffuse wheezing bilaterally, no rales, or rhonchi Abdominal: Soft. Non-tender, non-distended, bowel sounds are normal,  Ext: warm, no edema Neurological: A&O x3, non focal  Labs on Admission:  Basic Metabolic  Panel:  Recent Labs Lab 01/19/14 0935  NA 139  K 4.2  CL 104  CO2 19  GLUCOSE 102*  BUN 11  CREATININE 0.78  CALCIUM 9.7   Liver Function Tests: No results found for this basename: AST, ALT, ALKPHOS, BILITOT, PROT, ALBUMIN,  in the last 168 hours No results found for this basename: LIPASE, AMYLASE,  in the last 168 hours No results found for this basename: AMMONIA,  in the last 168 hours CBC:  Recent Labs Lab 01/19/14 0935  WBC 8.1  NEUTROABS 5.2  HGB 11.5*  HCT 35.5*  MCV 75.1*  PLT 404*   Cardiac Enzymes: No results found for this basename: CKTOTAL, CKMB, CKMBINDEX, TROPONINI,  in the last 168 hours BNP: No components found with this basename: POCBNP,  CBG: No results found for this basename: GLUCAP,  in the last 168 hours  Radiological Exams on Admission: Dg Chest Portable 1 View  01/19/2014   CLINICAL DATA:  Cough.  COPD.  Asthma and anxiety.  EXAM: PORTABLE CHEST - 1 VIEW  COMPARISON:  11/27/2013  FINDINGS: Numerous leads and wires project over the chest. Midline trachea. Normal heart size and mediastinal contours. No pleural effusion or pneumothorax. Mildly low  lung volumes. Clear lungs.  IMPRESSION: Normal chest.   Electronically Signed   By: Jeronimo Greaves M.D.   On: 01/19/2014 10:13    EKG:   Assessment/Plan  Principal Problem:   Respiratory failure, acute Patient has associated acute bronchitis-like symptoms, diffuse wheezing and likely exacerbation of her upper airway cough syndrome with underlying vocal cord dysfunction. Patient received IV Solu-Medrol and several rounds of albuterol nebs minimal wheezing the ED. -Admit on telemetry -IV start a Medrol 80 mg every 8 hours,  scheduled and when necessary albuterol nebs, I would place her on racemic epinephrine nebulizer for severe symptoms. Given symptoms of fever with cough we'll place her on empiric Levaquin. Supportive care with IV fluids, antiemetics PPI and Maalox. Add tesslon for cough. -home dose  xanax for anxiety. -Consult her pulmonologist Dr. Sherene Sires if needed. Patient reportedly ran out of her prescriptions about 2 weeks back as she could not afford them and would need help with prescriptions upon discharge.    Active Problems:   Chronic pain syndrome Patient reports having back surgery done 6 weeks back in [place] by Dr. Clinton Sawyer. She was following at a pain clinic but her doctor has now left from there and does have pain medications. continue home dose MS contin and oxycodone.  She reports having a broken screw in her back and it hurts when she coughs.     Asthma exacerbation As outlined above. IV Solu-Medrol and nebs    Anemia Stable     Upper airway cough syndrome, severe, with clinical VCD Follows with Dr Sherene Sires as outpt   Diet:regular  DVT prophylaxis: sq lovenox   Code Status: full code Family Communication: None at bedside Disposition Plan: home once improved  Paulyne Mooty [facility] Pager [phone]  Total time spent on admission :50 minutes  If 7PM-7AM, please contact night-coverage [email or web address] Password [email or web address] 01/19/2014, 2:45 PM

## 2014-01-20 DIAGNOSIS — F410 Panic disorder [episodic paroxysmal anxiety] without agoraphobia: Secondary | ICD-10-CM

## 2014-01-20 DIAGNOSIS — Z634 Disappearance and death of family member: Secondary | ICD-10-CM

## 2014-01-20 DIAGNOSIS — F411 Generalized anxiety disorder: Secondary | ICD-10-CM

## 2014-01-20 DIAGNOSIS — J209 Acute bronchitis, unspecified: Secondary | ICD-10-CM

## 2014-01-20 MED ORDER — LORAZEPAM 1 MG PO TABS: 1.0000 mg | ORAL_TABLET | Freq: Four times a day (QID) | ORAL | Status: DC | PRN

## 2014-01-20 MED ORDER — MORPHINE SULFATE ER 15 MG PO TBCR
15.0000 mg | EXTENDED_RELEASE_TABLET | Freq: Two times a day (BID) | ORAL | Status: DC
  Administered 2014-01-20: 15 mg via ORAL

## 2014-01-20 MED ORDER — CLONAZEPAM 1 MG PO TABS
1.0000 mg | ORAL_TABLET | Freq: Two times a day (BID) | ORAL | Status: DC
  Administered 2014-01-20: 1 mg via ORAL
  Filled 2014-01-20: qty 1

## 2014-01-20 MED ORDER — VENLAFAXINE HCL 37.5 MG PO TABS
37.5000 mg | ORAL_TABLET | Freq: Two times a day (BID) | ORAL | Status: DC
  Filled 2014-01-20 (×2): qty 1

## 2014-01-20 MED ORDER — ALPRAZOLAM 0.5 MG PO TABS: 1.0000 mg | ORAL_TABLET | Freq: Three times a day (TID) | ORAL | Status: DC

## 2014-01-20 MED ORDER — BLISTEX MEDICATED EX OINT
TOPICAL_OINTMENT | CUTANEOUS | Status: DC | PRN
  Filled 2014-01-20: qty 10

## 2014-01-20 MED ORDER — OXYCODONE HCL 5 MG PO TABS: 15.0000 mg | ORAL_TABLET | Freq: Four times a day (QID) | ORAL | Status: DC | PRN

## 2014-01-20 MED ORDER — THIAMINE HCL 100 MG/ML IJ SOLN
100.0000 mg | Freq: Every day | INTRAMUSCULAR | Status: DC
  Filled 2014-01-20: qty 1

## 2014-01-20 MED ORDER — PANTOPRAZOLE SODIUM 40 MG PO TBEC: 40.0000 mg | DELAYED_RELEASE_TABLET | Freq: Two times a day (BID) | ORAL | Status: DC

## 2014-01-20 MED ORDER — PREDNISONE 50 MG PO TABS
60.0000 mg | ORAL_TABLET | Freq: Every day | ORAL | Status: DC
  Administered 2014-01-20: 60 mg via ORAL
  Filled 2014-01-20 (×2): qty 1

## 2014-01-20 MED ORDER — VITAMIN B-1 100 MG PO TABS
100.0000 mg | ORAL_TABLET | Freq: Every day | ORAL | Status: DC
  Administered 2014-01-20: 100 mg via ORAL
  Filled 2014-01-20: qty 1

## 2014-01-20 MED ORDER — FOLIC ACID 1 MG PO TABS
1.0000 mg | ORAL_TABLET | Freq: Every day | ORAL | Status: DC
  Administered 2014-01-20: 1 mg via ORAL
  Filled 2014-01-20: qty 1

## 2014-01-20 MED ORDER — LORAZEPAM 2 MG/ML IJ SOLN: 1.0000 mg | Freq: Four times a day (QID) | INTRAMUSCULAR | Status: DC | PRN

## 2014-01-20 MED ORDER — WHITE PETROLATUM GEL: Status: DC | PRN

## 2014-01-20 MED ORDER — MORPHINE SULFATE ER 15 MG PO TBCR
15.0000 mg | EXTENDED_RELEASE_TABLET | Freq: Two times a day (BID) | ORAL | Status: DC
  Filled 2014-01-20: qty 1

## 2014-01-20 MED ORDER — ADULT MULTIVITAMIN W/MINERALS CH
1.0000 | ORAL_TABLET | Freq: Every day | ORAL | Status: DC
  Administered 2014-01-20: 1 via ORAL
  Filled 2014-01-20: qty 1

## 2014-01-20 NOTE — ED Provider Notes (Signed)
38 y.o. Female with hoarse cough and stridor.  Patient with similar episodes in past requiring hospitalization but no intubation.  Patient treated aggressively for stridor including vocal cord dysfunction with nebs, lidocaine, breathing through straw,and benzos without relief.    Plan admission to tele for further evaluation and treatment.    I performed a history and physical examination of Julie Kerr and discussed her management with Julie Kerr.  I agree with the history, physical, assessment, and plan of care, with the following exceptions: None  I was present for the following procedures: None Time Spent in Critical Care of the patient: None Time spent in discussions with the patient and family: 10  Elain Wixon S Parks Czajkowski    Hilario Quarry, MD 01/20/14 [phone]

## 2014-01-20 NOTE — Discharge Summary (Signed)
Physician Discharge Summary  Julie Kerr YQM:[id number] DOB: 11-Oct-1975 DOA: 01/19/2014  PCP: Oralia Rud, MD  Admit date: 01/19/2014 Patient left AMA on 01/20/2014  PLEASE NOTE THAT PATIENT SHOULD BE ON:   MS Contin 43m BID Oxycodone 142mtab 1 po four times daily as needed for breakthrough pain Xanax 80m780mCHS Venlafaxine 37.5mg12mD (based on conversation with her pain [facility] [place] [phone] psychiatry's recommendations during this admission)  Discharge Diagnoses:  Principal Problem:   Respiratory failure, acute Active Problems:   Chronic pain syndrome   Asthma exacerbation   Anemia   Stridor   Upper airway cough syndrome, severe, with clinical VCD   Respiratory distress   Acute bronchitis   Generalized anxiety disorder   Panic attacks   Condition: Fair   Wt Readings from Last 3 Encounters:  01/19/14 86.5 kg (190 lb 11.2 oz)  12/04/13 86.546 kg (190 lb 12.8 oz)  11/26/13 83.915 kg (185 lb)    History of present illness:  37 y20r old female with history of COPD/asthma , previous history of tobacco use, anxiety and panic attack symptoms with upper airway cough syndrome and vocal cord dysfunction , chronic lumbar radiculopathy who was admitted 3 times already this year for resp failure. 6 weeks back she was admitted for acute hypoxic respiratory failure likely in the setting of community acquired pneumonia / COPD exacerbation requiring steroids, nebs , racemic epinephrine and abx. She was discharged on dulera and oral steroid.  Patient reports that since her discharge she has not completely recovered and continued to have some cough and wheezing. However for the past 2 weeks he has worsening of her symptoms of cough, wheezing and shortness of breath as she's run out of her medications and could not afford them. Patient also has history of chronic low back pain with lumbar radiculopathy for which she underwent surgery in  [place] about 5 weeks back. She had started to follow up at pain clinic but her doctor left and hence she also running out of her pain medications. Patient reports fever of 101 degrees Fahrenheit at home yesterday. She was unable to speak properly and had persistent cough. She took a breathing treatment at home without any relief. She denies any phlegm. Patient denies any headache, dizziness, blurred vision, nausea, vomiting, chest pain, palpitations, abdominal pain, bowel or urinary symptoms. Denies any sick contacts or recent travel .  Hospital Course:   Chronic pain syndrome, patient reports having back surgery done 6 weeks back in [place] by Dr. WillMaricela Bo [facility] [facility] [place] and to verify the doses of her pain medication: Prescribed MS contin twice a day 15 mg 4 times a day when necessary, and was given prescriptions for 3 months starting on 3/23 and her last fill prescription was on 5/18.  Her pain medications were reduced to her prescribed levels.  The pain clinic was going to schedule her an appointment for next week, however, when they called her to schedule the appointment, she scheduled the appointment for 6/9 despite being told she was still going to be hospitalized that day and knowing she did not have transportation to the appointment.    Asthma exacerbation, has some wheezing but was sobbing during exam interfering with respiratory exam. Sounded like she has good air movement to the bases.  She was started on IV solumedrol initially but was quickly tapered to prednisone.  She received levofloxacin, duonebs, and dulera.  Attempted some education about use of albuterol and to stop concomitant use of advair and dulera, however, expect poor compliance.  Patient left AMA.  Upper airway cough syndrome, severe, with clinical VCD, could be triggered by GERD per Dr. Gustavus Bryant last note. Increase PPI to BID    Depression and anxiety, her mother died two weeks ago and she could not afford to attend funeral. States she has frequent panic attacks and has to use her Xanax frequently at home.  Psychiatry was consulted and recommended started effexor and continuing prn xanax.  She stated that she did not tolerate any SSRIs and she was only amenable to continuing xanax.  She also met with the chaplain.  She met with the case manager to see if there were other things we could do to assist with her tight finances, however, the patient became frustrated and stated we were "up in my business," and "not listening" to her.  She left against medical advice.    Anemia, chronic, stable.    Consultants:  Psychiatry Procedures:  Chest x-ray Antibiotics:  Levofloxacin from 6/7 >> 6/8   Discharge Exam: Filed Vitals:   01/20/14 1342  BP: 124/62  Pulse: 107  Temp: 98.4 F (36.9 C)  Resp: 21   Filed Vitals:   01/19/14 2043 01/19/14 2100 01/20/14 0345 01/20/14 1342  BP:  102/62 110/51 124/62  Pulse: 96 106 115 107  Temp:  98.5 F (36.9 C) 98.2 F (36.8 C) 98.4 F (36.9 C)  TempSrc:   Oral Oral  Resp: '20 16 16 21  ' Height:      Weight:      SpO2: 97% 98% 98% 100%    General: Adult female, No acute distress but clear emotional distress  HEENT: NCAT, MMM  Cardiovascular: RRR, nl S1, S2 no mrg, 2+ pulses, warm extremities  Respiratory: Found to have some upper airway noise in addition to progress breath sounds and some possible faint wheeze that does have good air movement to the bases, difficult to discern if she has focal rales, no increased WOB  Abdomen: NABS, soft, NT/ND  MSK: Normal tone and bulk, no LEE  Neuro: Grossly intact   Discharge Instructions     Medication List    ASK your doctor about these medications       albuterol 108 (90 BASE) MCG/ACT inhaler  Commonly known as:  PROVENTIL HFA;VENTOLIN HFA  Inhale 2 puffs into the lungs every 4 (four) hours as needed for wheezing.      albuterol (2.5 MG/3ML) 0.083% nebulizer solution  Commonly known as:  PROVENTIL  Take 3 mLs (2.5 mg total) by nebulization every 2 (two) hours as needed for wheezing.     alprazolam 2 MG tablet  Commonly known as:  XANAX  One every 6 hours as needed for nerves     mometasone-formoterol 100-5 MCG/ACT Aero  Commonly known as:  DULERA  Inhale 2 puffs into the lungs 2 (two) times daily.     morphine 30 MG 12 hr tablet  Commonly known as:  MS CONTIN  Take 1 tablet (30 mg total) by mouth every 12 (twelve) hours. NO refills, Refill only by primary MD     Oxycodone HCl 20 MG Tabs  Take 20 mg by mouth every 4 (four) hours as needed (for breakthrough pain).     PRESCRIPTION MEDICATION  Take 30 mLs by mouth every 4 (four) hours as needed (cough). Prescription cough syrup "Activi"  The results of significant diagnostics from this hospitalization (including imaging, microbiology, ancillary and laboratory) are listed below for reference.    Significant Diagnostic Studies: Dg Chest Portable 1 View  01/19/2014   CLINICAL DATA:  Cough.  COPD.  Asthma and anxiety.  EXAM: PORTABLE CHEST - 1 VIEW  COMPARISON:  11/27/2013  FINDINGS: Numerous leads and wires project over the chest. Midline trachea. Normal heart size and mediastinal contours. No pleural effusion or pneumothorax. Mildly low lung volumes. Clear lungs.  IMPRESSION: Normal chest.   Electronically Signed   By: Abigail Miyamoto M.D.   On: 01/19/2014 10:13    Microbiology: No results found for this or any previous visit (from the past 240 hour(s)).   Labs: Basic Metabolic Panel:  Recent Labs Lab 01/19/14 0935  NA 139  K 4.2  CL 104  CO2 19  GLUCOSE 102*  BUN 11  CREATININE 0.78  CALCIUM 9.7   Liver Function Tests: No results found for this basename: AST, ALT, ALKPHOS, BILITOT, PROT, ALBUMIN,  in the last 168 hours No results found for this basename: LIPASE, AMYLASE,  in the last 168 hours No results found for this  basename: AMMONIA,  in the last 168 hours CBC:  Recent Labs Lab 01/19/14 0935  WBC 8.1  NEUTROABS 5.2  HGB 11.5*  HCT 35.5*  MCV 75.1*  PLT 404*   Cardiac Enzymes: No results found for this basename: CKTOTAL, CKMB, CKMBINDEX, TROPONINI,  in the last 168 hours BNP: BNP (last 3 results)  Recent Labs  09/02/13 2358 10/29/13 0220 11/26/13 0500  PROBNP <5.0 8.1 10.4   CBG: No results found for this basename: GLUCAP,  in the last 168 hours  Time coordinating discharge: 45 minutes  Signed:  Janece Canterbury  [facility] 01/20/2014, 6:09 PM

## 2014-01-20 NOTE — Progress Notes (Addendum)
[facility] PROGRESS NOTE  Dawna Martine RAQ:[id number] DOB: 10-Jan-1976 DOA: 01/19/2014 PCP: Lu Duffel, MD  Assessment/Plan  Chronic pain syndrome, patient reports having back surgery done 6 weeks back in [place] by Dr. Clinton Sawyer. -  Spoke with [facility] in [place] to confirm that patient is a clinic patient there and to verify the doses of her pain medication:  Prescribed MS contin twice a day 15 mg 4 times a day when necessary, and was given prescriptions for 3 months starting on 3/23 and her last fill prescription was on 5/18. -  I will correct her pain medications in her system -  No prescriptions for narcotics at discharge -  The pain clinic is going to schedule her an appointment for next week, please fax the discharge summary with her medications to [phone] at discharge the pain clinic and review what we feel are her most updated medications.  Asthma exacerbation, has some wheezing but is sobbing during exam interfering with respiratory exam. Does sound like she has good air movement to the bases. -  DC Solu-Medrol -  Start prednisone -  Continue duo nebs -  Continue antibiotics, day 2 -  Continue dulera -  Patient states she is also taking advair at home, counseled she should only take one or the other -  Using albuterol 8 times per day at home, counseled not to use so often because this decreases efficacy  Upper airway cough syndrome, severe, with clinical VCD, could be triggered by GERD per Dr. Thurston Hole last note -  Notified Dr Sherene Sires via EPIC of patient's admission -  Increase PPI to BID  Depression and anxiety, her mother died two weeks ago and she cannot afford to attend funeral.  States she has frequent panic attacks and has to use her Xanax frequently at home. -  Psychiatry consultation -  Start clonazepam 1 mg twice a day in the meantime -  Would likely benefit from SSRI or other controller medication for generalized anxiety depression, but  will defer to psychiatry -  Continue when necessary Xanax -  Will notify chaplain  Anemia, chronic, stable.    Diet:  Regular Access:  PIV IVF:  Off Proph:  Lovenox  Code Status: Full Family Communication: Patient alone Disposition Plan: Pending improvement in breathing, psychiatry consultation   Consultants:  Psychiatry  Procedures:  Chest x-ray  Antibiotics:  Levofloxacin from 6/7 >>   HPI/Subjective:  Patient very emotional and sobbing during entire interview and exam, states her breathing has been much worse recently because she does not have air conditioning and she ran out of her pain and anxiety medications. She reports she has been taking her albuterol 8 times per day and has been using the third layer and her Advair when she has them. Frequent wet cough during exam.    Objective: Filed Vitals:   01/19/14 1349 01/19/14 2043 01/19/14 2100 01/20/14 0345  BP: 120/66  102/62 110/51  Pulse: 113 96 106 115  Temp: 98.9 F (37.2 C)  98.5 F (36.9 C) 98.2 F (36.8 C)  TempSrc: Oral   Oral  Resp: 18 20 16 16   Height: 5\' 2"  (1.575 m)     Weight: 86.5 kg (190 lb 11.2 oz)     SpO2: 99% 97% 98% 98%    Intake/Output Summary (Last 24 hours) at 01/20/14 0719 Last data filed at 01/20/14 0500  Gross per 24 hour  Intake  437.5 ml  Output   1100 ml  Net -662.5  ml   Filed Weights   01/19/14 1349  Weight: 86.5 kg (190 lb 11.2 oz)    Exam:   General:  Adult female, No acute distress but clear emotional distress  HEENT:  NCAT, MMM  Cardiovascular:  RRR, nl S1, S2 no mrg, 2+ pulses, warm extremities  Respiratory:  Found to have some upper airway noise in addition to progress breath sounds and some possible faint wheeze that does have good air movement to the bases, difficult to discern if she has focal rales, no increased WOB  Abdomen:   NABS, soft, NT/ND  MSK:   Normal tone and bulk, no LEE  Neuro:  Grossly intact  Data Reviewed: Basic Metabolic  Panel:  Recent Labs Lab 01/19/14 0935  NA 139  K 4.2  CL 104  CO2 19  GLUCOSE 102*  BUN 11  CREATININE 0.78  CALCIUM 9.7   Liver Function Tests: No results found for this basename: AST, ALT, ALKPHOS, BILITOT, PROT, ALBUMIN,  in the last 168 hours No results found for this basename: LIPASE, AMYLASE,  in the last 168 hours No results found for this basename: AMMONIA,  in the last 168 hours CBC:  Recent Labs Lab 01/19/14 0935  WBC 8.1  NEUTROABS 5.2  HGB 11.5*  HCT 35.5*  MCV 75.1*  PLT 404*   Cardiac Enzymes: No results found for this basename: CKTOTAL, CKMB, CKMBINDEX, TROPONINI,  in the last 168 hours BNP (last 3 results)  Recent Labs  09/02/13 2358 10/29/13 0220 11/26/13 0500  PROBNP <5.0 8.1 10.4   CBG: No results found for this basename: GLUCAP,  in the last 168 hours  No results found for this or any previous visit (from the past 240 hour(s)).   Studies: Dg Chest Portable 1 View  01/19/2014   CLINICAL DATA:  Cough.  COPD.  Asthma and anxiety.  EXAM: PORTABLE CHEST - 1 VIEW  COMPARISON:  11/27/2013  FINDINGS: Numerous leads and wires project over the chest. Midline trachea. Normal heart size and mediastinal contours. No pleural effusion or pneumothorax. Mildly low lung volumes. Clear lungs.  IMPRESSION: Normal chest.   Electronically Signed   By: Jeronimo GreavesKyle  Talbot M.D.   On: 01/19/2014 10:13    Scheduled Meds: . albuterol  2.5 mg Nebulization TID  . enoxaparin (LOVENOX) injection  40 mg Subcutaneous Q24H  . folic acid  1 mg Oral Daily  . levofloxacin (LEVAQUIN) IV  750 mg Intravenous Q24H  . methylPREDNISolone (SOLU-MEDROL) injection  80 mg Intravenous 3 times per day  . mometasone-formoterol  2 puff Inhalation BID  . morphine  30 mg Oral Q12H  . multivitamin with minerals  1 tablet Oral Daily  . pantoprazole  40 mg Oral Daily  . sodium chloride  3 mL Intravenous Q12H  . thiamine  100 mg Oral Daily   Or  . thiamine  100 mg Intravenous Daily   Continuous  Infusions: . sodium chloride 75 mL/hr at 01/20/14 0434    Principal Problem:   Respiratory failure, acute Active Problems:   Chronic pain syndrome   Asthma exacerbation   Anemia   Stridor   Upper airway cough syndrome, severe, with clinical VCD   Respiratory distress   Acute bronchitis    Time spent: 30 min    Renae FickleMackenzie Barnes Florek  [facility] Pager [phone]. If 7PM-7AM, please contact night-coverage at [email or web address], password [email or web address] 01/20/2014, 7:19 AM  LOS: 1 day

## 2014-01-20 NOTE — Care Management Note (Signed)
    Page 1 of 1   01/20/2014     12:42:50 PM CARE MANAGEMENT NOTE 01/20/2014  Patient:  Julie Kerr, Julie Kerr   Account Number:  [id number]  Date Initiated:  01/20/2014  Documentation initiated by:  GRAVES-BIGELOW,Crickett Abbett  Subjective/Objective Assessment:   Pt admitted for SOB and Asthma. Pt states she has PCP in [facility] clinic in [place]. Pt states she is from that area.     Action/Plan:   Pt uses [facility] @ [facility]. CM did call and spoke with Jphn Smothers and he stated that pt can set up an account and she will be able to get meds without paying co pay at time of pick up.   Anticipated DC Date:  01/22/2014   Anticipated DC Plan:  HOME/SELF CARE      DC Planning Services  CM consult      Choice offered to / List presented to:             Status of service:  Completed, signed off Medicare Important Message given?  NO (If response is "NO", the following Medicare IM given date fields will be blank) Date Medicare IM given:   Date Additional Medicare IM given:    Discharge Disposition:  HOME/SELF CARE  Per UR Regulation:  Reviewed for med. necessity/level of care/duration of stay  If discussed at Long Length of Stay Meetings, dates discussed:    Comments:  01-20-14 1239 Tomi Bamberger, RN,BSN [phone] Pharmacy to set pt up with home delivery since she has issues with transportation. CSW to assist with transportation needs. CM did offet to set pt up with closer PCP like the [facility]. Pt declined due to she states her PCP- she likes the regimen they have her on. No further needs from CM at this time.
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
## 2014-01-20 NOTE — Progress Notes (Signed)
Chaplain spoke with pt per consult.  Pt seems distraught and emotional pertaining to her recent loss.  Chaplain provided emotional and pastoral support but pt seemed busy, facilitating other conversations and requesting the nurse.  Chaplain assessment:  Pt was alert and responsive, but did seem troubled but her current circumstance and lack of resources.  There is nothing from a financial standpoint that chaplain services can do to accommodate her at this time, but perhaps a visit from a CSW would be of some assistance to the pt.

## 2014-01-20 NOTE — Plan of Care (Cosign Needed)
Paged by RN- pt endorsing 10/10 back pain- records reveal pt with CHRONIC lumbar pain on high dose narcs:MSContin 30 BID and Oxy IR 20 mg q 4 hrs prn which had been reordered upon admission. Actually admitted with COPD exacerbation. Note she does drink ETOH but amount not clarified. Apparently allergic to NSAIDs. No clear cut reason for acute change in pain so will not increase pain meds. Suspect pt receives meds from pain clinic-I'm concerned she may also medicate herself at home with ETOH. She did receive several doses of Ativan in the ER but already on Xanax high dose at home which was also reordered at admission so will not begin CIWA at this time-she also received total of 12 mg IV Morphine in the ER-clinically does not appear to be in acute ETOH withdrawal  Junious Silk, ANP

## 2014-01-21 ENCOUNTER — Encounter: Payer: Self-pay | Admitting: Internal Medicine

## 2014-01-22 ENCOUNTER — Telehealth: Payer: Self-pay | Admitting: *Deleted

## 2014-01-22 NOTE — Telephone Encounter (Signed)
LMTCB

## 2014-01-22 NOTE — Telephone Encounter (Signed)
Message copied by Christen Butter on Wed Jan 22, 2014  3:34 PM ------      Message from: Nyoka Cowden      Created: Tue Jan 21, 2014  5:35 PM       Pt overdo for med reconciliation with Tammy NP and if refuse then document and discharge from practice as left [facility] from [facility] after admit with "asthma" attack ------

## 2014-01-23 NOTE — Telephone Encounter (Signed)
lmomtcb x1 

## 2014-01-23 NOTE — Telephone Encounter (Signed)
LMTCB

## 2014-01-23 NOTE — Telephone Encounter (Signed)
[phone] returning a  call

## 2014-01-27 NOTE — Telephone Encounter (Signed)
LMTCB

## 2014-01-30 ENCOUNTER — Encounter: Payer: Self-pay | Admitting: *Deleted

## 2014-01-30 NOTE — Telephone Encounter (Signed)
Letter mailed to the pt. 

## 2014-02-06 ENCOUNTER — Encounter: Payer: Self-pay | Admitting: Physical Medicine & Rehabilitation

## 2014-02-06 ENCOUNTER — Telehealth: Payer: Self-pay | Admitting: Internal Medicine

## 2014-02-06 MED ORDER — ALBUTEROL SULFATE HFA 108 (90 BASE) MCG/ACT IN AERS: 2.0000 | INHALATION_SPRAY | RESPIRATORY_TRACT | Status: DC | PRN

## 2014-02-06 NOTE — Telephone Encounter (Signed)
Can refill until ov- just need to know what she needs and explain she will need to keep this appt for any additional refills LMTCB

## 2014-02-06 NOTE — Telephone Encounter (Signed)
Spoke with the pt  She is requesting refill on ventolin and alprazolam 2 mg  She states that Dr Anner CreteWells originally prescribed alprazolam, but she is no longer seeing him b/c she moved  I advised that we can refill the inhaler, but will have to ask MW about Rebeca Allegraalpraz  She is aware MW out of the office at this time and okay with waiting  I advised that she must keep pending appt with TP for med cal to receive further rx  Please advise on refill thanks!

## 2014-02-07 NOTE — Telephone Encounter (Signed)
LMTCB

## 2014-02-07 NOTE — Telephone Encounter (Signed)
albtuerol x one refill only  No narcs or benzo's s ov - she'll have to go to an UC if hasn't established with a new doctor by now

## 2014-02-10 ENCOUNTER — Inpatient Hospital Stay (HOSPITAL_COMMUNITY)
Admission: EM | Admit: 2014-02-10 | Discharge: 2014-02-11 | DRG: 189 | Disposition: A | Discharge status: Home / Self Care | Payer: Medicaid Other | Attending: Internal Medicine | Admitting: Internal Medicine

## 2014-02-10 ENCOUNTER — Emergency Department (HOSPITAL_COMMUNITY): Payer: Medicaid Other

## 2014-02-10 ENCOUNTER — Encounter (HOSPITAL_COMMUNITY): Payer: Self-pay | Admitting: Emergency Medicine

## 2014-02-10 DIAGNOSIS — J441 Chronic obstructive pulmonary disease with (acute) exacerbation: Secondary | ICD-10-CM | POA: Diagnosis present

## 2014-02-10 DIAGNOSIS — J9601 Acute respiratory failure with hypoxia: Secondary | ICD-10-CM | POA: Diagnosis present

## 2014-02-10 DIAGNOSIS — F411 Generalized anxiety disorder: Secondary | ICD-10-CM

## 2014-02-10 DIAGNOSIS — G894 Chronic pain syndrome: Secondary | ICD-10-CM | POA: Diagnosis present

## 2014-02-10 DIAGNOSIS — F410 Panic disorder [episodic paroxysmal anxiety] without agoraphobia: Secondary | ICD-10-CM | POA: Diagnosis present

## 2014-02-10 DIAGNOSIS — D649 Anemia, unspecified: Secondary | ICD-10-CM | POA: Diagnosis present

## 2014-02-10 DIAGNOSIS — J45901 Unspecified asthma with (acute) exacerbation: Secondary | ICD-10-CM

## 2014-02-10 DIAGNOSIS — J9600 Acute respiratory failure, unspecified whether with hypoxia or hypercapnia: Secondary | ICD-10-CM

## 2014-02-10 DIAGNOSIS — Z825 Family history of asthma and other chronic lower respiratory diseases: Secondary | ICD-10-CM

## 2014-02-10 DIAGNOSIS — R061 Stridor: Secondary | ICD-10-CM

## 2014-02-10 DIAGNOSIS — R0602 Shortness of breath: Secondary | ICD-10-CM

## 2014-02-10 DIAGNOSIS — J383 Other diseases of vocal cords: Secondary | ICD-10-CM | POA: Diagnosis present

## 2014-02-10 DIAGNOSIS — F17200 Nicotine dependence, unspecified, uncomplicated: Secondary | ICD-10-CM | POA: Diagnosis present

## 2014-02-10 LAB — I-STAT TROPONIN, ED: Troponin i, poc: 0 ng/mL (ref 0.00–0.08)

## 2014-02-10 LAB — CBC WITH DIFFERENTIAL/PLATELET
BASOS PCT: 0 % (ref 0–1)
Basophils Absolute: 0 10*3/uL (ref 0.0–0.1)
Eosinophils Absolute: 0.1 10*3/uL (ref 0.0–0.7)
Eosinophils Relative: 1 % (ref 0–5)
HCT: 35.7 % — ABNORMAL LOW (ref 36.0–46.0)
HEMOGLOBIN: 11.4 g/dL — AB (ref 12.0–15.0)
Lymphocytes Relative: 36 % (ref 12–46)
Lymphs Abs: 3.2 10*3/uL (ref 0.7–4.0)
MCH: 24.2 pg — AB (ref 26.0–34.0)
MCHC: 31.9 g/dL (ref 30.0–36.0)
MCV: 75.8 fL — ABNORMAL LOW (ref 78.0–100.0)
MONO ABS: 0.4 10*3/uL (ref 0.1–1.0)
MONOS PCT: 4 % (ref 3–12)
Neutro Abs: 5.3 10*3/uL (ref 1.7–7.7)
Neutrophils Relative %: 59 % (ref 43–77)
Platelets: 393 10*3/uL (ref 150–400)
RBC: 4.71 MIL/uL (ref 3.87–5.11)
RDW: 17.9 % — ABNORMAL HIGH (ref 11.5–15.5)
WBC: 8.9 10*3/uL (ref 4.0–10.5)

## 2014-02-10 MED ORDER — ALBUTEROL (5 MG/ML) CONTINUOUS INHALATION SOLN
15.0000 mg/h | INHALATION_SOLUTION | Freq: Once | RESPIRATORY_TRACT | Status: AC
  Administered 2014-02-10: 15 mg/h via RESPIRATORY_TRACT

## 2014-02-10 MED ORDER — ALBUTEROL (5 MG/ML) CONTINUOUS INHALATION SOLN: 15.0000 mg/h | INHALATION_SOLUTION | RESPIRATORY_TRACT | Status: DC

## 2014-02-10 MED ORDER — MORPHINE SULFATE 4 MG/ML IJ SOLN
4.0000 mg | Freq: Once | INTRAMUSCULAR | Status: AC
  Administered 2014-02-10: 4 mg via INTRAVENOUS
  Filled 2014-02-10: qty 1

## 2014-02-10 MED ORDER — IPRATROPIUM BROMIDE 0.02 % IN SOLN: 1.0000 mg | Freq: Once | RESPIRATORY_TRACT | Status: DC

## 2014-02-10 MED ORDER — IPRATROPIUM BROMIDE 0.02 % IN SOLN
RESPIRATORY_TRACT | Status: AC
  Filled 2014-02-10: qty 2.5

## 2014-02-10 MED ORDER — ALBUTEROL (5 MG/ML) CONTINUOUS INHALATION SOLN
INHALATION_SOLUTION | RESPIRATORY_TRACT | Status: AC
  Filled 2014-02-10: qty 20

## 2014-02-10 MED ORDER — LORAZEPAM 2 MG/ML IJ SOLN
0.5000 mg | Freq: Once | INTRAMUSCULAR | Status: AC
  Administered 2014-02-10: 0.5 mg via INTRAVENOUS
  Filled 2014-02-10: qty 1

## 2014-02-10 MED ORDER — METHYLPREDNISOLONE SODIUM SUCC 125 MG IJ SOLR
125.0000 mg | Freq: Once | INTRAMUSCULAR | Status: AC
  Administered 2014-02-10: 125 mg via INTRAVENOUS
  Filled 2014-02-10: qty 2

## 2014-02-10 MED ORDER — IPRATROPIUM BROMIDE 0.02 % IN SOLN
1.0000 mg | Freq: Once | RESPIRATORY_TRACT | Status: AC
  Administered 2014-02-10: 1 mg via RESPIRATORY_TRACT

## 2014-02-10 NOTE — ED Notes (Signed)
Pt states that she started having sharp pains in her neck, that radiated down to her legs. Pt states she then fell and started having an asthma attack. Pt states that she has a hx of asthma, COPD, emphysema and bronchitis. Pt is also reporting chest pain.

## 2014-02-10 NOTE — Telephone Encounter (Signed)
Spoke with the pt and notified of recs per MW  She verbalized understanding  Nothing further needed 

## 2014-02-10 NOTE — ED Provider Notes (Signed)
CSN: [id number]     Arrival date & time 02/10/14  2309 History   First MD Initiated Contact with Patient 02/10/14 2317     Chief Complaint  Patient presents with  . Fall  . Shortness of Breath     (Consider location/radiation/quality/duration/timing/severity/associated sxs/prior Treatment) HPI  This is a 38 year old female with a history of asthma, vocal cord dysfunction who is followed by Dr. Sherene SiresWert who presents with shortness of breath and chest pain. Patient reports onset of symptoms just prior to arrival. Patient states that she had onset of back pain and fell. She states her symptoms started after that. She reports anterior chest pain and difficulty breathing. She is tried her albuterol without relief. She denies any fever. She does report nonproductive cough.  Patient is tachypneic with intermittent stridor but is able to provide some history. Level V caveat for acuity of condition.  Past Medical History  Diagnosis Date  . Asthma   . Anxiety   . COPD (chronic obstructive pulmonary disease)   . Bronchitis    Past Surgical History  Procedure Laterality Date  . Spinal fusion     Family History  Problem Relation Age of Onset  . HIV Mother   . Heart disease Father   . CVA Father   . Heart disease Other   . Emphysema Maternal Grandmother     smoked  . Asthma Sister   . Clotting disorder Sister   . Clotting disorder Maternal Grandmother   . Lung cancer Maternal Grandmother     smoked   History  Substance Use Topics  . Smoking status: Former Smoker -- 2.00 packs/day for 17 years    Types: Cigarettes    Quit date: 08/16/2013  . Smokeless tobacco: Never Used  . Alcohol Use: Yes     Comment: occasional   OB History   Grav Para Term Preterm Abortions TAB SAB Ect Mult Living                 Review of Systems  Constitutional: Negative for fever.  Respiratory: Positive for cough, shortness of breath, wheezing and stridor. Negative for chest tightness.   Cardiovascular:  Positive for chest pain. Negative for leg swelling.  Gastrointestinal: Negative for nausea, vomiting and abdominal pain.  Genitourinary: Negative for dysuria.  Musculoskeletal: Positive for back pain.  Skin: Negative for wound.  Neurological: Negative for headaches.  Psychiatric/Behavioral: Negative for confusion.  All other systems reviewed and are negative.     Allergies  Robitussin dm; Rayon, purified; Nsaids; and Tramadol  Home Medications   Prior to Admission medications   Medication Sig Start Date End Date Taking? Authorizing Provider  albuterol (PROVENTIL HFA;VENTOLIN HFA) 108 (90 BASE) MCG/ACT inhaler Inhale 2 puffs into the lungs every 4 (four) hours as needed for wheezing. 02/06/14  Yes Nyoka CowdenMichael B Wert, MD  albuterol (PROVENTIL) (2.5 MG/3ML) 0.083% nebulizer solution Take 3 mLs (2.5 mg total) by nebulization every 2 (two) hours as needed for wheezing. 11/28/13  Yes Kathlen ModyVijaya Akula, MD  alprazolam Prudy Feeler(XANAX) 2 MG tablet Take 2 mg by mouth every 6 (six) hours as needed for anxiety.   Yes Historical Provider, MD  mometasone-formoterol (DULERA) 100-5 MCG/ACT AERO Inhale 2 puffs into the lungs 2 (two) times daily. 11/28/13  Yes Kathlen ModyVijaya Akula, MD  Oxycodone HCl 20 MG TABS Take 20 mg by mouth every 4 (four) hours as needed (for breakthrough pain).   Yes Historical Provider, MD  PRESCRIPTION MEDICATION Take 30 mLs by mouth every 4 (four)  hours as needed (cough). Prescription cough syrup "Activi"   Yes Historical Provider, MD   BP 112/57  Pulse 103  Temp(Src) 99 F (37.2 C) (Oral)  Resp 24  SpO2 98%  LMP 01/20/2014 Physical Exam  Nursing note and vitals reviewed. Constitutional: She is oriented to person, place, and time.  Tachypnea, intermittent stridor  HENT:  Head: Normocephalic and atraumatic.  Mouth/Throat: Oropharynx is clear and moist.  Eyes: Pupils are equal, round, and reactive to light.  Neck: Neck supple.  Cardiovascular: Regular rhythm and normal heart sounds.   No murmur  heard. Tachycardia  Pulmonary/Chest: Effort normal. No respiratory distress. She has wheezes.  Transmitted upper airway sounds, otherwise scant wheezing  Abdominal: Soft. Bowel sounds are normal. There is no tenderness. There is no rebound.  Musculoskeletal: She exhibits no edema.  Neurological: She is alert and oriented to person, place, and time.  Skin: Skin is warm and dry.  Psychiatric:  Anxious appearing    ED Course  Procedures (including critical care time)  CRITICAL CARE Performed by: Ross MarcusHORTON, COURTNEY, F   Total critical care time: 35 min  Critical care time was exclusive of separately billable procedures and treating other patients.  Critical care was necessary to treat or prevent imminent or life-threatening deterioration.  Critical care was time spent personally by me on the following activities: development of treatment plan with patient and/or surrogate as well as nursing, discussions with consultants, evaluation of patient's response to treatment, examination of patient, obtaining history from patient or surrogate, ordering and performing treatments and interventions, ordering and review of laboratory studies, ordering and review of radiographic studies, pulse oximetry and re-evaluation of patient's condition.       Labs Review Labs Reviewed  CBC WITH DIFFERENTIAL - Abnormal; Notable for the following:    Hemoglobin 11.4 (*)    HCT 35.7 (*)    MCV 75.8 (*)    MCH 24.2 (*)    RDW 17.9 (*)    All other components within normal limits  BASIC METABOLIC PANEL - Abnormal; Notable for the following:    Glucose, Bld 121 (*)    All other components within normal limits  URINE RAPID DRUG SCREEN (HOSP PERFORMED) - Abnormal; Notable for the following:    Opiates POSITIVE (*)    All other components within normal limits  I-STAT ARTERIAL BLOOD GAS, ED - Abnormal; Notable for the following:    Acid-base deficit 4.0 (*)    All other components within normal limits   MRSA PCR SCREENING  PREGNANCY, URINE  CBC WITH DIFFERENTIAL  CBC  CBC  I-STAT TROPOININ, ED    Imaging Review Dg Lumbar Spine 2-3 Views  02/11/2014   CLINICAL DATA:  Fall, back pain radiating to bilateral legs.  EXAM: LUMBAR SPINE - 2-3 VIEW  COMPARISON:  CT lumbar spine September 09, 2013  FINDINGS: There is no evidence of lumbar spine fracture. L5-S1 PLIF, without radiographic findings of interbody solid bony fusion. Alignment is normal. Intervertebral disc spaces are maintained.  IMPRESSION: No acute fracture deformity or malalignment.  L5-S1 PLIF, no definite solid interbody fusion.   Electronically Signed   By: Awilda Metroourtnay  Bloomer   On: 02/11/2014 03:38   Dg Pelvis 1-2 Views  02/11/2014   CLINICAL DATA:  Lambert ModySharp back pain radiating to the legs.  Fall.  EXAM: PELVIS - 1-2 VIEW  COMPARISON:  Bilateral hip radiographs November 28, 2013  FINDINGS: There is no evidence of pelvic fracture or diastasis. No other pelvic bone lesions are  seen. Lumbar instrumentation.  IMPRESSION: Negative.   Electronically Signed   By: Awilda Metro   On: 02/11/2014 03:36   Dg Chest Portable 1 View  02/10/2014   CLINICAL DATA:  Shortness of breath  EXAM: PORTABLE CHEST - 1 VIEW  COMPARISON:  01/19/2014  FINDINGS: The heart size and mediastinal contours are within normal limits for portable technique and hypoaeration. Both lungs are clear. The visualized skeletal structures are unremarkable.  IMPRESSION: No active disease.   Electronically Signed   By: Tiburcio Pea M.D.   On: 02/10/2014 23:57     EKG Interpretation   Date/Time:  Monday February 10 2014 23:18:16 EDT Ventricular Rate:  108 PR Interval:  165 QRS Duration: 71 QT Interval:  336 QTC Calculation: 450 R Axis:   72 Text Interpretation:  Sinus tachycardia Borderline T abnormalities,  inferior leads Wandering baseline Confirmed by HORTON  MD, COURTNEY  ([id number]) on 02/10/2014 11:58:57 PM      MDM   Final diagnoses:  Stridor  Shortness of breath    Patient presents with shortness of breath and stridor. History is asthma and vocal cord dysfunction. She is satting 100% on room air. Does have significant upper respiratory stridor and transmitted breath sounds. Patient was given Solu-Medrol and and given a duo neb. She was also given Ativan for her history of vocal cord dysfunction. Patient had no worsening of symptoms but had continued symptoms while the ER. Patient was subsequently given nebulized lidocaine but states that it made things worse.  This was discontinued. Patient continues to sat 100% on room air. Objectively she has no wheezing but does have transmitted upper airway sounds. Will try low-dose ketamine.  Patient did have some improvement of symptoms with 20 mg of ketamine push at the bedside.    Given persistence of symptoms, will admit. Chest x-ray and other workup is reassuring at this time. Patient will be admitted to the step down unit for concerns for impending airway compromise.    Shon Baton, MD 02/11/14 [phone]

## 2014-02-10 NOTE — Telephone Encounter (Signed)
[phone] calling back

## 2014-02-10 NOTE — Telephone Encounter (Signed)
[email or web address]. Jennifer Castillo, CMA  

## 2014-02-11 ENCOUNTER — Encounter (HOSPITAL_COMMUNITY): Payer: Self-pay | Admitting: Internal Medicine

## 2014-02-11 ENCOUNTER — Inpatient Hospital Stay (HOSPITAL_COMMUNITY): Payer: Medicaid Other

## 2014-02-11 DIAGNOSIS — J441 Chronic obstructive pulmonary disease with (acute) exacerbation: Secondary | ICD-10-CM

## 2014-02-11 DIAGNOSIS — J9600 Acute respiratory failure, unspecified whether with hypoxia or hypercapnia: Principal | ICD-10-CM

## 2014-02-11 DIAGNOSIS — G894 Chronic pain syndrome: Secondary | ICD-10-CM

## 2014-02-11 DIAGNOSIS — J45901 Unspecified asthma with (acute) exacerbation: Secondary | ICD-10-CM

## 2014-02-11 DIAGNOSIS — F17200 Nicotine dependence, unspecified, uncomplicated: Secondary | ICD-10-CM

## 2014-02-11 DIAGNOSIS — J383 Other diseases of vocal cords: Secondary | ICD-10-CM

## 2014-02-11 DIAGNOSIS — F411 Generalized anxiety disorder: Secondary | ICD-10-CM

## 2014-02-11 DIAGNOSIS — R061 Stridor: Secondary | ICD-10-CM

## 2014-02-11 DIAGNOSIS — F410 Panic disorder [episodic paroxysmal anxiety] without agoraphobia: Secondary | ICD-10-CM

## 2014-02-11 LAB — I-STAT ARTERIAL BLOOD GAS, ED
ACID-BASE DEFICIT: 4 mmol/L — AB (ref 0.0–2.0)
BICARBONATE: 20.8 meq/L (ref 20.0–24.0)
O2 Saturation: 97 %
Patient temperature: 99
TCO2: 22 mmol/L (ref 0–100)
pCO2 arterial: 35.4 mmHg (ref 35.0–45.0)
pH, Arterial: 7.379 (ref 7.350–7.450)
pO2, Arterial: 89 mmHg (ref 80.0–100.0)

## 2014-02-11 LAB — PREGNANCY, URINE: Preg Test, Ur: NEGATIVE

## 2014-02-11 LAB — BASIC METABOLIC PANEL
BUN: 12 mg/dL (ref 6–23)
CO2: 19 mEq/L (ref 19–32)
CREATININE: 0.76 mg/dL (ref 0.50–1.10)
Calcium: 9.8 mg/dL (ref 8.4–10.5)
Chloride: 101 mEq/L (ref 96–112)
Glucose, Bld: 121 mg/dL — ABNORMAL HIGH (ref 70–99)
POTASSIUM: 4 meq/L (ref 3.7–5.3)
Sodium: 139 mEq/L (ref 137–147)

## 2014-02-11 LAB — RAPID URINE DRUG SCREEN, HOSP PERFORMED
AMPHETAMINES: NOT DETECTED
BARBITURATES: NOT DETECTED
Benzodiazepines: NOT DETECTED
Cocaine: NOT DETECTED
OPIATES: POSITIVE — AB
TETRAHYDROCANNABINOL: NOT DETECTED

## 2014-02-11 LAB — MRSA PCR SCREENING: MRSA by PCR: NEGATIVE

## 2014-02-11 MED ORDER — LIDOCAINE HCL (PF) 2 % IJ SOLN
3.0000 mL | Freq: Once | INTRAMUSCULAR | Status: AC
  Administered 2014-02-11: 3 mL
  Filled 2014-02-11: qty 10

## 2014-02-11 MED ORDER — ACETAMINOPHEN 325 MG PO TABS: 650.0000 mg | ORAL_TABLET | Freq: Four times a day (QID) | ORAL | Status: DC | PRN

## 2014-02-11 MED ORDER — METHYLPREDNISOLONE SODIUM SUCC 40 MG IJ SOLR
40.0000 mg | Freq: Two times a day (BID) | INTRAMUSCULAR | Status: DC
  Administered 2014-02-11: 40 mg via INTRAVENOUS
  Filled 2014-02-11 (×2): qty 1

## 2014-02-11 MED ORDER — BUPROPION HCL ER (SR) 150 MG PO TB12
150.0000 mg | ORAL_TABLET | Freq: Two times a day (BID) | ORAL | Status: DC
  Filled 2014-02-11 (×2): qty 1

## 2014-02-11 MED ORDER — ACETAMINOPHEN 650 MG RE SUPP: 650.0000 mg | Freq: Four times a day (QID) | RECTAL | Status: DC | PRN

## 2014-02-11 MED ORDER — BUPROPION HCL ER (SR) 150 MG PO TB12: 150.0000 mg | ORAL_TABLET | Freq: Two times a day (BID) | ORAL | Status: DC

## 2014-02-11 MED ORDER — HYDROMORPHONE HCL PF 1 MG/ML IJ SOLN
1.0000 mg | Freq: Once | INTRAMUSCULAR | Status: AC
  Administered 2014-02-11: 1 mg via INTRAVENOUS
  Filled 2014-02-11: qty 1

## 2014-02-11 MED ORDER — KETAMINE HCL 10 MG/ML IJ SOLN
10.0000 mg | Freq: Once | INTRAMUSCULAR | Status: AC
  Administered 2014-02-11: 10 mg via INTRAVENOUS
  Filled 2014-02-11: qty 1

## 2014-02-11 MED ORDER — MONTELUKAST SODIUM 10 MG PO TABS: 10.0000 mg | ORAL_TABLET | Freq: Every day | ORAL | Status: DC

## 2014-02-11 MED ORDER — MORPHINE SULFATE ER 15 MG PO TBCR
30.0000 mg | EXTENDED_RELEASE_TABLET | Freq: Two times a day (BID) | ORAL | Status: DC
  Administered 2014-02-11: 30 mg via ORAL
  Filled 2014-02-11: qty 2

## 2014-02-11 MED ORDER — ENOXAPARIN SODIUM 40 MG/0.4ML ~~LOC~~ SOLN
40.0000 mg | Freq: Every day | SUBCUTANEOUS | Status: DC
  Administered 2014-02-11: 40 mg via SUBCUTANEOUS
  Filled 2014-02-11: qty 0.4

## 2014-02-11 MED ORDER — ALBUTEROL SULFATE (2.5 MG/3ML) 0.083% IN NEBU: 2.5000 mg | INHALATION_SOLUTION | RESPIRATORY_TRACT | Status: DC | PRN

## 2014-02-11 MED ORDER — KETAMINE HCL 10 MG/ML IJ SOLN: 10.0000 mg | Freq: Once | INTRAMUSCULAR | Status: DC

## 2014-02-11 MED ORDER — MORPHINE SULFATE ER 15 MG PO TBCR: 30.0000 mg | EXTENDED_RELEASE_TABLET | Freq: Two times a day (BID) | ORAL | Status: DC

## 2014-02-11 MED ORDER — MONTELUKAST SODIUM 10 MG PO TABS
10.0000 mg | ORAL_TABLET | Freq: Every day | ORAL | Status: DC
  Filled 2014-02-11: qty 1

## 2014-02-11 MED ORDER — ALPRAZOLAM 0.5 MG PO TABS
2.0000 mg | ORAL_TABLET | Freq: Four times a day (QID) | ORAL | Status: DC | PRN
  Administered 2014-02-11 (×2): 2 mg via ORAL
  Filled 2014-02-11 (×2): qty 4

## 2014-02-11 MED ORDER — OXYCODONE HCL 5 MG PO TABS
20.0000 mg | ORAL_TABLET | ORAL | Status: DC | PRN
Start: 2014-02-11 — End: 2014-02-11
  Administered 2014-02-11 (×2): 20 mg via ORAL
  Filled 2014-02-11 (×2): qty 4

## 2014-02-11 MED ORDER — BUDESONIDE 0.25 MG/2ML IN SUSP
0.2500 mg | Freq: Two times a day (BID) | RESPIRATORY_TRACT | Status: DC
  Administered 2014-02-11: 0.25 mg via RESPIRATORY_TRACT
  Filled 2014-02-11 (×3): qty 2

## 2014-02-11 MED ORDER — ONDANSETRON HCL 4 MG/2ML IJ SOLN: 4.0000 mg | Freq: Four times a day (QID) | INTRAMUSCULAR | Status: DC | PRN

## 2014-02-11 MED ORDER — ONDANSETRON HCL 4 MG PO TABS: 4.0000 mg | ORAL_TABLET | Freq: Four times a day (QID) | ORAL | Status: DC | PRN

## 2014-02-11 MED ORDER — KETAMINE HCL 10 MG/ML IJ SOLN
50.0000 mg | Freq: Once | INTRAMUSCULAR | Status: AC
  Administered 2014-02-11: 10 mg via INTRAVENOUS
  Filled 2014-02-11: qty 5

## 2014-02-11 MED ORDER — SODIUM CHLORIDE 0.9 % IV SOLN
INTRAVENOUS | Status: DC
  Administered 2014-02-11: 04:00:00 via INTRAVENOUS

## 2014-02-11 NOTE — Progress Notes (Signed)
DC orders received.  Patient stable with no S/S of distress.  Medication and discharge information reviewed with patient. Patient inquired to RN as to why she was not DC'd with prescription for pain medication.  RN asked patient what did the MD say, and she said that he said she needed to follow up with her MD who prescribed medication.  RN advised patient to follow up with her MD.  Patient said, "That's fine, I'll just be back here again tonight."  Patient DC home via private vehicle. [place], Mitzi HansenJessica Marie, RN

## 2014-02-11 NOTE — Discharge Summary (Signed)
Physician Discharge Summary  Julie Schildermanda Forton XBM:[id number] DOB: 10/13/1975 DOA: 02/10/2014  PCP: Lu DuffelWELLS,WENDELL, MD  Admit date: 02/10/2014 Discharge date: 02/11/2014  Time spent: 40 minutes  Recommendations for Outpatient Follow-up:  Acute respiratory failure -Most likely multifactorial to include continued tobacco use, poorly controlled asthma, panic disorder, psychogenic. -Counseled patient that her asthma is poorly controlled and that first step would be abstaining from any further tobacco use. -Start Wellbutrin 150 mg BID -Singulair 10 mg daily -Continue Dulera 100 -5 mcg BID -Continue Proventil PRN -Followup with pulmonologist  Tobacco use disorder -Start Wellbutrin 150 mg BID  Asthma exacerbation -See acute respiratory failure  Panic attack -Recommend patient be set up with outpatient psychologist/psychiatrist, and be started on Zoloft or similar medication  Vocal cord dysplasia -Followup with pulmonologist  Chronic pain syndrome -Counseled patient that secondary to her back surgery x2 she would always be in pain. The goal of pain control would be to maintain her daily pain at approximately 4/10. -Counseled patient that the warning signs of increased serious problems would be loss of bowel or bladder control, loss of sensation below the waist, loss of ability to ambulate at all. -Counseled patient to use her cane or walker whenever ambulating to avoid falls. -Counseled patient that we are unable to fill her chronic pain medications and that she needs to return to her pain clinic.   Discharge Diagnoses:  Active Problems:   Acute respiratory failure   Tobacco use disorder   Chronic pain syndrome   Asthma exacerbation   Vocal cord dysfunction   Panic attacks   Discharge Condition: Stable  Diet recommendation: Heart healthy  There were no vitals filed for this visit.  History of present illness:  Julie Kerr is a 38 y.o. BF PMHx panic attacks, anxiety, chronic  pain, COPD, uncontrolled asthma/vocal cord dysfunction, S./P. fusion L5-S1 with revision. was brought to the ER because of sudden onset of shortness of breath with wheezing since last night. Patient in the ER was given Solu-Medrol nebulizer and Ketamine despite which patient was feeling short of breath. Patient on exam has mild wheezing with difficulty talking. Patient has had previous history of intubation. ABG is pending. Patient has known history of chronic low back pain. Patient states that she did fall yesterday but did not hit her head or lose consciousness. Patient complains of some pleuritic type of chest pain on both ribs side.    Hospital Course:  6/30 patient states continues to smoke but has cut down drastically to one cigarette per day. Patient's other concern is that she has run out of her pain medication (sees a pain doctor).  Procedures: 6/29 PCXR; no active disease 6/30 pelvic x-ray; negative 6/30-L-spine;No acute fracture deformity or malalignment,L5-S1 PLIF, no definite solid interbody fusion    Consultations: NA  Antibiotics NA   Discharge Exam: Filed Vitals:   02/11/14 0717 02/11/14 0729 02/11/14 0800 02/11/14 0900  BP:   110/62 105/55  Pulse: 101  102 99  Temp:      TempSrc:      Resp: 16  18 24   SpO2: 98% 100% 98% 98%    General: A./O. x4, NAD, Cardiovascular: Regular rhythm and rate, negative murmurs rubs gallops, normal S1/S2 Respiratory: Mild diffuse expiratory wheezing  Discharge Instructions     Medication List    ASK your doctor about these medications       albuterol (2.5 MG/3ML) 0.083% nebulizer solution  Commonly known as:  PROVENTIL  Take 3 mLs (2.5 mg total) by nebulization  every 2 (two) hours as needed for wheezing.     albuterol 108 (90 BASE) MCG/ACT inhaler  Commonly known as:  PROVENTIL HFA;VENTOLIN HFA  Inhale 2 puffs into the lungs every 4 (four) hours as needed for wheezing.     alprazolam 2 MG tablet  Commonly known as:   XANAX  Take 2 mg by mouth every 6 (six) hours as needed for anxiety.     mometasone-formoterol 100-5 MCG/ACT Aero  Commonly known as:  DULERA  Inhale 2 puffs into the lungs 2 (two) times daily.     Oxycodone HCl 20 MG Tabs  Take 20 mg by mouth every 4 (four) hours as needed (for breakthrough pain).     PRESCRIPTION MEDICATION  Take 30 mLs by mouth every 4 (four) hours as needed (cough). Prescription cough syrup "Activi"       Allergies  Allergen Reactions  . Robitussin Dm [Dextromethorphan-Guaifenesin] Nausea And Vomiting  . Rayon, Purified Hives  . Nsaids Hives  . Tramadol Hives      The results of significant diagnostics from this hospitalization (including imaging, microbiology, ancillary and laboratory) are listed below for reference.    Significant Diagnostic Studies: Dg Lumbar Spine 2-3 Views  02/11/2014   CLINICAL DATA:  Fall, back pain radiating to bilateral legs.  EXAM: LUMBAR SPINE - 2-3 VIEW  COMPARISON:  CT lumbar spine September 09, 2013  FINDINGS: There is no evidence of lumbar spine fracture. L5-S1 PLIF, without radiographic findings of interbody solid bony fusion. Alignment is normal. Intervertebral disc spaces are maintained.  IMPRESSION: No acute fracture deformity or malalignment.  L5-S1 PLIF, no definite solid interbody fusion.   Electronically Signed   By: Awilda Metro   On: 02/11/2014 03:38   Dg Pelvis 1-2 Views  02/11/2014   CLINICAL DATA:  Lambert Mody back pain radiating to the legs.  Fall.  EXAM: PELVIS - 1-2 VIEW  COMPARISON:  Bilateral hip radiographs November 28, 2013  FINDINGS: There is no evidence of pelvic fracture or diastasis. No other pelvic bone lesions are seen. Lumbar instrumentation.  IMPRESSION: Negative.   Electronically Signed   By: Awilda Metro   On: 02/11/2014 03:36   Dg Chest Portable 1 View  02/10/2014   CLINICAL DATA:  Shortness of breath  EXAM: PORTABLE CHEST - 1 VIEW  COMPARISON:  01/19/2014  FINDINGS: The heart size and mediastinal  contours are within normal limits for portable technique and hypoaeration. Both lungs are clear. The visualized skeletal structures are unremarkable.  IMPRESSION: No active disease.   Electronically Signed   By: Tiburcio Pea M.D.   On: 02/10/2014 23:57   Dg Chest Portable 1 View  01/19/2014   CLINICAL DATA:  Cough.  COPD.  Asthma and anxiety.  EXAM: PORTABLE CHEST - 1 VIEW  COMPARISON:  11/27/2013  FINDINGS: Numerous leads and wires project over the chest. Midline trachea. Normal heart size and mediastinal contours. No pleural effusion or pneumothorax. Mildly low lung volumes. Clear lungs.  IMPRESSION: Normal chest.   Electronically Signed   By: Jeronimo Greaves M.D.   On: 01/19/2014 10:13    Microbiology: Recent Results (from the past 240 hour(s))  MRSA PCR SCREENING     Status: None   Collection Time    02/11/14  3:34 AM      Result Value Ref Range Status   MRSA by PCR NEGATIVE  NEGATIVE Final   Comment:            The GeneXpert MRSA Assay (FDA  approved for NASAL specimens     only), is one component of a     comprehensive MRSA colonization     surveillance program. It is not     intended to diagnose MRSA     infection nor to guide or     monitor treatment for     MRSA infections.     Labs: Basic Metabolic Panel:  Recent Labs Lab 02/10/14 2331  NA 139  K 4.0  CL 101  CO2 19  GLUCOSE 121*  BUN 12  CREATININE 0.76  CALCIUM 9.8   Liver Function Tests: No results found for this basename: AST, ALT, ALKPHOS, BILITOT, PROT, ALBUMIN,  in the last 168 hours No results found for this basename: LIPASE, AMYLASE,  in the last 168 hours No results found for this basename: AMMONIA,  in the last 168 hours CBC:  Recent Labs Lab 02/10/14 2331  WBC 8.9  NEUTROABS 5.3  HGB 11.4*  HCT 35.7*  MCV 75.8*  PLT 393   Cardiac Enzymes: No results found for this basename: CKTOTAL, CKMB, CKMBINDEX, TROPONINI,  in the last 168 hours BNP: BNP (last 3 results)  Recent Labs   09/02/13 2358 10/29/13 0220 11/26/13 0500  PROBNP <5.0 8.1 10.4   CBG: No results found for this basename: GLUCAP,  in the last 168 hours     Signed:  Carolyne Littlesurtis Woods, MD [facility] [phone] pager

## 2014-02-11 NOTE — H&P (Signed)
[facility] History and Physical  Julie Kerr [id number] DOB: 05/05/1976 DOA: 02/10/2014  Referring physician: ER physician. PCP: Lu DuffelWELLS,WENDELL, MD   Chief Complaint: Shortness of breath.  HPI: Julie Kerr is a 38 y.o. female with history of asthma/vocal cord dysfunction was brought to the ER because of sudden onset of shortness of breath with wheezing since last night. Patient in the ER was given Solu-Medrol nebulizer and Ketamine despite which patient was feeling short of breath. Patient on exam has mild wheezing with difficulty talking. Patient has had previous history of intubation. ABG is pending. Patient has known history of chronic low back pain. Patient states that she did fall yesterday but did not hit her head or lose consciousness. Patient complains of some pleuritic type of chest pain on both ribs side.   Review of Systems: As presented in the history of presenting illness, rest negative.  Past Medical History  Diagnosis Date  . Asthma   . Anxiety   . COPD (chronic obstructive pulmonary disease)   . Bronchitis    Past Surgical History  Procedure Laterality Date  . Spinal fusion     Social History:  reports that she quit smoking about 5 months ago. Her smoking use included Cigarettes. She has a 34 pack-year smoking history. She has never used smokeless tobacco. She reports that she drinks alcohol. She reports that she does not use illicit drugs. Where does patient live home. Can patient participate in ADLs? Yes.  Allergies  Allergen Reactions  . Robitussin Dm [Dextromethorphan-Guaifenesin] Nausea And Vomiting  . Rayon, Purified Hives  . Nsaids Hives  . Tramadol Hives    Family History:  Family History  Problem Relation Age of Onset  . HIV Mother   . Heart disease Father   . CVA Father   . Heart disease Other   . Emphysema Maternal Grandmother     smoked  . Asthma Sister   . Clotting disorder Sister   . Clotting disorder Maternal Grandmother   .  Lung cancer Maternal Grandmother     smoked      Prior to Admission medications   Medication Sig Start Date End Date Taking? Authorizing Provider  albuterol (PROVENTIL HFA;VENTOLIN HFA) 108 (90 BASE) MCG/ACT inhaler Inhale 2 puffs into the lungs every 4 (four) hours as needed for wheezing. 02/06/14  Yes Nyoka CowdenMichael B Wert, MD  albuterol (PROVENTIL) (2.5 MG/3ML) 0.083% nebulizer solution Take 3 mLs (2.5 mg total) by nebulization every 2 (two) hours as needed for wheezing. 11/28/13  Yes Kathlen ModyVijaya Akula, MD  alprazolam Prudy Feeler(XANAX) 2 MG tablet Take 2 mg by mouth every 6 (six) hours as needed for anxiety.   Yes Historical Provider, MD  mometasone-formoterol (DULERA) 100-5 MCG/ACT AERO Inhale 2 puffs into the lungs 2 (two) times daily. 11/28/13  Yes Kathlen ModyVijaya Akula, MD  Oxycodone HCl 20 MG TABS Take 20 mg by mouth every 4 (four) hours as needed (for breakthrough pain).   Yes Historical Provider, MD  PRESCRIPTION MEDICATION Take 30 mLs by mouth every 4 (four) hours as needed (cough). Prescription cough syrup "Activi"   Yes Historical Provider, MD    Physical Exam: Filed Vitals:   02/11/14 0130 02/11/14 0200 02/11/14 0215 02/11/14 0245  BP: 130/87  118/66 135/86  Pulse: 130 124 126 121  Temp:      TempSrc:      Resp: 26 34 30 30  SpO2: 99% 98% 93% 96%     General:  Well-developed and nourished.  Eyes: Anicteric no pallor.  ENT: No discharge from the ears eyes nose mouth.  Neck: No mass felt.  Cardiovascular: S1-S2 heard.  Respiratory: Bilateral expiratory wheeze heard no crepitations.  Abdomen: Soft nontender bowel sounds present. No guarding or rigidity.  Skin: No rash.  Musculoskeletal: No edema.  Psychiatric: Appears normal.  Neurologic: Alert and oriented to time place and person. Moves all extremities.  Labs on Admission:  Basic Metabolic Panel:  Recent Labs Lab 02/10/14 2331  NA 139  K 4.0  CL 101  CO2 19  GLUCOSE 121*  BUN 12  CREATININE 0.76  CALCIUM 9.8   Liver Function  Tests: No results found for this basename: AST, ALT, ALKPHOS, BILITOT, PROT, ALBUMIN,  in the last 168 hours No results found for this basename: LIPASE, AMYLASE,  in the last 168 hours No results found for this basename: AMMONIA,  in the last 168 hours CBC:  Recent Labs Lab 02/10/14 2331  WBC 8.9  NEUTROABS 5.3  HGB 11.4*  HCT 35.7*  MCV 75.8*  PLT 393   Cardiac Enzymes: No results found for this basename: CKTOTAL, CKMB, CKMBINDEX, TROPONINI,  in the last 168 hours  BNP (last 3 results)  Recent Labs  09/02/13 2358 10/29/13 0220 11/26/13 0500  PROBNP <5.0 8.1 10.4   CBG: No results found for this basename: GLUCAP,  in the last 168 hours  Radiological Exams on Admission: Dg Chest Portable 1 View  02/10/2014   CLINICAL DATA:  Shortness of breath  EXAM: PORTABLE CHEST - 1 VIEW  COMPARISON:  01/19/2014  FINDINGS: The heart size and mediastinal contours are within normal limits for portable technique and hypoaeration. Both lungs are clear. The visualized skeletal structures are unremarkable.  IMPRESSION: No active disease.   Electronically Signed   By: Tiburcio PeaJonathan  Watts M.D.   On: 02/10/2014 23:57    EKG: Independently reviewed. Sinus tachycardia.  Assessment/Plan Active Problems:   Acute respiratory failure   Chronic pain syndrome   Asthma exacerbation   Vocal cord dysfunction   1. Acute respiratory failure - possible causes include vocal cord dysfunction versus asthma. Continue with nebulizer IV steroids and Pulmicort and closely observe in step down overnight. ABG is pending. 2. Anemia - follow CBC. 3. Chronic pain - continue home medications.    Code Status: Full code.  Family Communication: Patient's sister at the bedside.  Disposition Plan: Admit to inpatient.    Kasir Hallenbeck N. [facility] Pager [phone].  If 7PM-7AM, please contact night-coverage [email or web address] Password [email or web address] 02/11/2014, 3:16 AM

## 2014-02-24 ENCOUNTER — Ambulatory Visit (INDEPENDENT_AMBULATORY_CARE_PROVIDER_SITE_OTHER): Payer: Medicaid Other | Admitting: Adult Health

## 2014-02-24 ENCOUNTER — Encounter: Payer: Self-pay | Admitting: Adult Health

## 2014-02-24 VITALS — BP 108/60 | HR 84 | Temp 98.4°F | Ht 62.0 in | Wt 186.0 lb

## 2014-02-24 DIAGNOSIS — J45901 Unspecified asthma with (acute) exacerbation: Secondary | ICD-10-CM

## 2014-02-24 MED ORDER — LEVALBUTEROL HCL 0.63 MG/3ML IN NEBU: 0.6300 mg | INHALATION_SOLUTION | Freq: Once | RESPIRATORY_TRACT | Status: DC

## 2014-02-24 MED ORDER — LEVALBUTEROL HCL 0.63 MG/3ML IN NEBU: 0.6300 mg | INHALATION_SOLUTION | RESPIRATORY_TRACT | Status: DC | PRN

## 2014-02-24 MED ORDER — LEVALBUTEROL HCL 0.63 MG/3ML IN NEBU
0.6300 mg | INHALATION_SOLUTION | Freq: Once | RESPIRATORY_TRACT | Status: AC
  Administered 2014-02-24: 0.63 mg via RESPIRATORY_TRACT

## 2014-02-24 NOTE — Assessment & Plan Note (Addendum)
Recurrent flare complicated by VCD, anxiety, chronic pain syndome and upper airway cough  Case discussed with Dr. Sherene SiresWert  W/ pt exam  Pt was explained that our office will not be able to prescribe any narcotic or anxiety medications for her .  We have asked her to follow up with her PCP to work through a plan for chronic pain management /anxiety issues .  Discussed med management with Dulera, to avoid Dry powder inhaler in future as may aggravate upper airway (ie Advair, etc)   Plan  Restart Dulera 100/765mcg 2 puffs Twice daily  , rinse after use.  Stop Advair and Tudorza.  Discuss with family doctor regarding pain meds and anxiety meds .  Follow up with family doctor for asthma/COPD issues.  If symptoms do not improve or worsen or seek emergency care

## 2014-02-24 NOTE — Progress Notes (Signed)
Pt seen and examined and chart reviewed as well as ov note  She has severe vcd but when not coughing doesn't wheeze at all and also doesn't pseudowheeze. Clearly should not be on powdered inhalers that aggravate the problem so given dulera hfa sample and referred back to primary care ? [facility]

## 2014-02-24 NOTE — Progress Notes (Signed)
Subjective:    Patient ID: Julie Kerr, female    DOB: 03/19/1976    MRN: [id number]  HPI [age] yobf with asthma since age 89 on inhalers daily quit smoking 08/2013 but if anything worse since then referred by [facility]  Admit date: 11/25/2013  Discharge date: 11/28/2013 .  Discharge Diagnoses:  Acute respiratory failure   COPD exacerbation  Chronic pain syndrome  Vocal cord dysfunction  Discharge Condition: improved  Diet recommendation: low sodium diet.  Filed Weights    11/26/13 0046   Weight:  83.915 kg (185 lb)   History of present illness:    38 y.o. female with history of COPD/asthma, previous history of tobacco abuse presents to the ER because of shortness of breath. Patient states she has been short of breath since yesterday morning with nonproductive cough and has been having pleuritic type of chest pain. In the ER patient was found to be wheezing and acutely short of breath and had received racemic epinephrine, Solu-Medrol nebulizer. Chest x-ray does not show anything acute. Patient has been admitted for further workup  Hospital Course:  1. Acute hypoxemic respiratory failure -Evidence by respiratory of 32, patient in respiratory distress, requiring racemic epinephrine  -Likely secondary to community acquire pneumonia with superimposed COPD exacerbation.  -Initial chest x-ray did not reveal acute infiltrates.  -she was started on , IV antibiotics, nebs, supplemental oxygen as needed. Over the next 48 hours she was transitioned to oral steroids and taper over the next one week. She was discharged on dulera. . An appointment is made with PCP and pulmonologist next week. Recommend checking cbc to evaluate for resolution of the leukocytosis.  2. Chronic obstructive pulmonary disease exacerbation  -Likely precipitated by underlying infectious process  -she was started on IV steroids, her exam today shows no wheezing, good air entry bilateral. She was transitioned to  oral steroids.  -Patient reports quitting smoking 7 months ago  3. Suspected community acquire pneumonia  -It is possible underlying infectious process precipitating COPD  -Continue Levaquin to complete the course.  -Flu swab was negative  -Followup on repeat chest x-ray shows clear chest , no infiltrates.  4. Abnormal TSH  -TSH at 0.324  -Likely related to acute illness and steroids  -Would recommend repeat TSH as an outpatient  Anemia:  Normocytic. Stable. Outpatient follow up.  Anxiety and panic attacks:  On xanax prn    12/04/2013 1st [facility] office visit/ Wert  Chief Complaint  Patient presents with  . Pulmonary Consult    Referred per Dr. Blake DivineAkula. Pt reports dx of COPD approx 10 yrs ago. She c/o increased SOB and cough x 5 days. SOB bothers her all of the time- made worse by anxiety.  Cough is prod occ with minimal yellow/blood tinged sputum.  She is using rescue inhaler on average 6 x per day and using albuterol neb 4 x per day.  last did well for a couple of weeks while on "strong cough med with codeine" = tussionex per d/c instructions plus xanax - prednisone and inhalers have stopped working for her Last used neb around 1.5 h prior to OV    02/24/2014 Acute OV  Pt returns for persistent cough , congestion, barking cough and wheezing . Complains of  Chest congestion, sinus pressure, PND, sore throat and ear pain. Complains of asthma flare for last few days.  Was admitted 2 weeks for similar symptoms . Tx w/ IV steroids and Nebs. No better  with treatment. Feels she  Cant stop wheezing or coughing.  Very anxious and upset today stating she is out of her pain meds and anxiety meds.  Unfortunately she continues to smoker.  She does not have her inhalers -Dulera.  She has Advair and [place] which are old. And she was discharged on Dulera from hospital 2 weeks ago.  Pt has difficulty with VCD complicated by anxiety and chronic pain .  She has a barking cough and upper  airway wheezing throughout exam unless she is talking then at times it goes away.  Does not improve with Xopenex neb in office.  She denies any hemopytsis ,fever, chest pain or orthopnea.  Friend is with her today.     Review of Systems  Constitutional: Negative for fever and chills.  HENT: Positive for congestion, and sneezing. Negative for dental problem, ear pain, postnasal drip, rhinorrhea, sinus pressure, sore throat, trouble swallowing and voice change.   Eyes: Negative for visual disturbance.  Respiratory: Positive for cough and shortness of breath. Negative for choking.   Cardiovascular:   Negative for leg swelling.  Gastrointestinal: Negative for vomiting, abdominal pain and diarrhea.  Genitourinary: Negative for difficulty urinating.    Musculoskeletal: Positive for arthralgias.  Skin: Negative for rash.  Neurological: Positive for headaches. Negative for tremors and syncope.  Hematological: Does not bruise/bleed easily.       Objective:   Physical Exam  amb  Mod obese bf with moderate to severe  pseudowheeze     HEENT: nl dentition, turbinates, and orophanx. Nl external ear canals without cough reflex   NECK :  without JVD/Nodes/TM/ nl carotid upstrokes bilaterally   LUNGS: no acc muscle use, mainly clear w/ pursed lip exhalation , however loud referred upper airway psuedowheezing  CV:  RRR  no s3 or murmur or increase in P2, no edema   ABD:  soft and nontender with nl excursion in the supine position. No bruits or organomegaly, bowel sounds nl  MS:  warm without deformities, calf tenderness, cyanosis or clubbing  SKIN: warm and dry without lesions  , several tatoos.   NEURO:  alert, approp, no deficits    cxr 11/27/13  There is no edema or consolidation. Heart size and pulmonary  vascularity are normal. No adenopathy. No bone lesions.      Assessment & Plan:

## 2014-02-24 NOTE — Patient Instructions (Addendum)
Restart Dulera 100/705mcg 2 puffs Twice daily  , rinse after use.  Stop Advair and Tudorza.  Discuss with family doctor regarding pain meds and anxiety meds .  Follow up with family doctor for asthma/COPD issues.  If symptoms do not improve or worsen or seek emergency care

## 2014-03-03 ENCOUNTER — Encounter: Payer: Self-pay | Admitting: Internal Medicine

## 2014-03-07 ENCOUNTER — Emergency Department (HOSPITAL_COMMUNITY)
Admission: EM | Admit: 2014-03-07 | Discharge: 2014-03-08 | Disposition: A | Discharge status: Home / Self Care | Payer: Medicaid Other | Attending: Emergency Medicine | Admitting: Emergency Medicine

## 2014-03-07 DIAGNOSIS — G8929 Other chronic pain: Secondary | ICD-10-CM | POA: Diagnosis not present

## 2014-03-07 DIAGNOSIS — Z87891 Personal history of nicotine dependence: Secondary | ICD-10-CM | POA: Diagnosis not present

## 2014-03-07 DIAGNOSIS — F410 Panic disorder [episodic paroxysmal anxiety] without agoraphobia: Secondary | ICD-10-CM | POA: Insufficient documentation

## 2014-03-07 DIAGNOSIS — R0609 Other forms of dyspnea: Secondary | ICD-10-CM | POA: Diagnosis present

## 2014-03-07 DIAGNOSIS — J45901 Unspecified asthma with (acute) exacerbation: Secondary | ICD-10-CM

## 2014-03-07 DIAGNOSIS — M549 Dorsalgia, unspecified: Secondary | ICD-10-CM | POA: Diagnosis not present

## 2014-03-07 DIAGNOSIS — R0989 Other specified symptoms and signs involving the circulatory and respiratory systems: Secondary | ICD-10-CM | POA: Diagnosis not present

## 2014-03-07 DIAGNOSIS — J441 Chronic obstructive pulmonary disease with (acute) exacerbation: Secondary | ICD-10-CM | POA: Diagnosis not present

## 2014-03-07 DIAGNOSIS — Z79899 Other long term (current) drug therapy: Secondary | ICD-10-CM | POA: Insufficient documentation

## 2014-03-07 MED ORDER — METHYLPREDNISOLONE SODIUM SUCC 125 MG IJ SOLR
125.0000 mg | Freq: Once | INTRAMUSCULAR | Status: AC
  Administered 2014-03-08: 125 mg via INTRAVENOUS
  Filled 2014-03-07: qty 2

## 2014-03-07 MED ORDER — ALBUTEROL (5 MG/ML) CONTINUOUS INHALATION SOLN
10.0000 mg/h | INHALATION_SOLUTION | Freq: Once | RESPIRATORY_TRACT | Status: AC
  Administered 2014-03-08: 10 mg/h via RESPIRATORY_TRACT

## 2014-03-07 MED ORDER — IPRATROPIUM BROMIDE 0.02 % IN SOLN
0.5000 mg | Freq: Once | RESPIRATORY_TRACT | Status: AC
  Administered 2014-03-08: 0.5 mg via RESPIRATORY_TRACT
  Filled 2014-03-07: qty 2.5

## 2014-03-07 MED ORDER — ALBUTEROL (5 MG/ML) CONTINUOUS INHALATION SOLN
INHALATION_SOLUTION | RESPIRATORY_TRACT | Status: AC
  Administered 2014-03-08: 10 mg/h via RESPIRATORY_TRACT
  Filled 2014-03-07: qty 20

## 2014-03-07 MED ORDER — MAGNESIUM SULFATE 40 MG/ML IJ SOLN: 2.0000 g | Freq: Once | INTRAMUSCULAR | Status: DC

## 2014-03-07 NOTE — ED Notes (Signed)
Pt to ED via POV with resp  Distress. Pt st's she started having diff breathing yesterday and has used her inhalers without no relief.  Pt also c/o chest pain

## 2014-03-08 ENCOUNTER — Encounter (HOSPITAL_COMMUNITY): Payer: Self-pay | Admitting: Emergency Medicine

## 2014-03-08 ENCOUNTER — Emergency Department (HOSPITAL_COMMUNITY): Payer: Medicaid Other

## 2014-03-08 LAB — I-STAT ARTERIAL BLOOD GAS, ED
Acid-base deficit: 1 mmol/L (ref 0.0–2.0)
BICARBONATE: 23.5 meq/L (ref 20.0–24.0)
O2 Saturation: 100 %
PCO2 ART: 37.5 mmHg (ref 35.0–45.0)
PH ART: 7.405 (ref 7.350–7.450)
TCO2: 25 mmol/L (ref 0–100)
pO2, Arterial: 323 mmHg — ABNORMAL HIGH (ref 80.0–100.0)

## 2014-03-08 LAB — BASIC METABOLIC PANEL
ANION GAP: 15 (ref 5–15)
BUN: 10 mg/dL (ref 6–23)
CALCIUM: 9.7 mg/dL (ref 8.4–10.5)
CO2: 20 mEq/L (ref 19–32)
Chloride: 106 mEq/L (ref 96–112)
Creatinine, Ser: 0.79 mg/dL (ref 0.50–1.10)
GFR calc Af Amer: >90 mL/min (ref >90)
GLUCOSE: 108 mg/dL — AB (ref 70–99)
POTASSIUM: 3.9 meq/L (ref 3.7–5.3)
SODIUM: 141 meq/L (ref 137–147)

## 2014-03-08 LAB — PRO B NATRIURETIC PEPTIDE

## 2014-03-08 LAB — CBC WITH DIFFERENTIAL/PLATELET
Basophils Absolute: 0 10*3/uL (ref 0.0–0.1)
Basophils Relative: 0 % (ref 0–1)
EOS ABS: 0.1 10*3/uL (ref 0.0–0.7)
EOS PCT: 1 % (ref 0–5)
HCT: 36.9 % (ref 36.0–46.0)
Hemoglobin: 11.7 g/dL — ABNORMAL LOW (ref 12.0–15.0)
LYMPHS ABS: 3.2 10*3/uL (ref 0.7–4.0)
Lymphocytes Relative: 35 % (ref 12–46)
MCH: 24.9 pg — AB (ref 26.0–34.0)
MCHC: 31.7 g/dL (ref 30.0–36.0)
MCV: 78.5 fL (ref 78.0–100.0)
Monocytes Absolute: 0.6 10*3/uL (ref 0.1–1.0)
Monocytes Relative: 6 % (ref 3–12)
NEUTROS PCT: 58 % (ref 43–77)
Neutro Abs: 5.4 10*3/uL (ref 1.7–7.7)
PLATELETS: 381 10*3/uL (ref 150–400)
RBC: 4.7 MIL/uL (ref 3.87–5.11)
RDW: 18 % — AB (ref 11.5–15.5)
WBC: 9.3 10*3/uL (ref 4.0–10.5)

## 2014-03-08 LAB — MAGNESIUM: Magnesium: 1.9 mg/dL (ref 1.5–2.5)

## 2014-03-08 LAB — TROPONIN I: Troponin I: <0.3 ng/mL (ref <0.30)

## 2014-03-08 MED ORDER — OXYCODONE-ACETAMINOPHEN 5-325 MG PO TABS
2.0000 | ORAL_TABLET | Freq: Once | ORAL | Status: AC
  Administered 2014-03-08: 2 via ORAL
  Filled 2014-03-08: qty 2

## 2014-03-08 MED ORDER — LORAZEPAM 1 MG PO TABS: 1.0000 mg | ORAL_TABLET | Freq: Three times a day (TID) | ORAL | Status: DC | PRN

## 2014-03-08 MED ORDER — HYDROCODONE-ACETAMINOPHEN 5-325 MG PO TABS: 1.0000 | ORAL_TABLET | Freq: Four times a day (QID) | ORAL | Status: DC | PRN

## 2014-03-08 MED ORDER — LORAZEPAM 2 MG/ML IJ SOLN
1.0000 mg | Freq: Once | INTRAMUSCULAR | Status: AC
  Administered 2014-03-08: 1 mg via INTRAVENOUS
  Filled 2014-03-08: qty 1

## 2014-03-08 NOTE — ED Provider Notes (Signed)
CSN: [id number]     Arrival date & time 03/07/14  2349 History   First MD Initiated Contact with Patient 03/07/14 2352     Chief Complaint  Patient presents with  . Respiratory Distress     (Consider location/radiation/quality/duration/timing/severity/associated sxs/prior Treatment) HPI Comments: 38 year old female with history of COPD/asthma, previous history of tobacco use, anxiety and panic attack symptoms with upper airway cough syndrome and vocal cord dysfunction comes in with cc of respiratory distress. Pt arrives with RR in the 30s, with stridors, and unable to talk. PT reports that she has been having dyspnea x 2 days - getting worse today. Taken 4 tx today. + intubation hx. + chest pain - diffuse with cough. No substance abuse, + smoker.  The history is provided by the patient.    Past Medical History  Diagnosis Date  . Asthma   . Anxiety   . COPD (chronic obstructive pulmonary disease)   . Bronchitis    Past Surgical History  Procedure Laterality Date  . Spinal fusion     Family History  Problem Relation Age of Onset  . HIV Mother   . Heart disease Father   . CVA Father   . Heart disease Other   . Emphysema Maternal Grandmother     smoked  . Asthma Sister   . Clotting disorder Sister   . Clotting disorder Maternal Grandmother   . Lung cancer Maternal Grandmother     smoked   History  Substance Use Topics  . Smoking status: Former Smoker -- 2.00 packs/day for 17 years    Types: Cigarettes    Quit date: 08/16/2013  . Smokeless tobacco: Never Used  . Alcohol Use: Yes     Comment: occasional   OB History   Grav Para Term Preterm Abortions TAB SAB Ect Mult Living                 Review of Systems  Unable to perform ROS: Severe respiratory distress      Allergies  Robitussin dm; [place], purified; Nsaids; and Tramadol  Home Medications   Prior to Admission medications   Medication Sig Start Date End Date Taking? Authorizing Provider  acetaminophen  (TYLENOL) 500 MG tablet Take 1,000 mg by mouth every 6 (six) hours as needed for fever.   Yes Historical Provider, MD  albuterol (PROVENTIL HFA;VENTOLIN HFA) 108 (90 BASE) MCG/ACT inhaler Inhale 2 puffs into the lungs every 4 (four) hours as needed for wheezing. 02/06/14  Yes Nyoka Cowden, MD  albuterol (PROVENTIL) (2.5 MG/3ML) 0.083% nebulizer solution Take 3 mLs (2.5 mg total) by nebulization every 2 (two) hours as needed for wheezing. 11/28/13  Yes Kathlen Mody, MD  alprazolam Prudy Feeler) 2 MG tablet Take 2 mg by mouth every 6 (six) hours as needed for anxiety.   Yes Historical Provider, MD  levalbuterol (XOPENEX) 0.63 MG/3ML nebulizer solution Take 3 mLs (0.63 mg total) by nebulization every 4 (four) hours as needed for wheezing or shortness of breath. 02/24/14  Yes Tammy S Parrett, NP  mometasone-formoterol (DULERA) 100-5 MCG/ACT AERO Inhale 2 puffs into the lungs 2 (two) times daily. 11/28/13  Yes Kathlen Mody, MD  morphine (MSIR) 30 MG tablet Take 30 mg by mouth every 6 (six) hours as needed for severe pain.   Yes Historical Provider, MD  Oxycodone HCl 20 MG TABS Take 20 mg by mouth every 4 (four) hours as needed (for breakthrough pain).   Yes Historical Provider, MD  HYDROcodone-acetaminophen (NORCO/VICODIN) 5-325 MG per  tablet Take 1 tablet by mouth every 6 (six) hours as needed. 03/08/14   Derwood KaplanAnkit Vanette Noguchi, MD  LORazepam (ATIVAN) 1 MG tablet Take 1 tablet (1 mg total) by mouth 3 (three) times daily as needed for anxiety. 03/08/14   Jailyn Langhorst Rhunette CroftNanavati, MD   BP 99/48  Pulse 95  Temp(Src) 99.2 F (37.3 C) (Axillary)  Resp 20  SpO2 99%  LMP 02/19/2014 Physical Exam  Constitutional: She is oriented to person, place, and time. She appears well-developed and well-nourished.  HENT:  Head: Normocephalic and atraumatic.  Eyes: EOM are normal. Pupils are equal, round, and reactive to light.  Neck: Neck supple. No JVD present.  Cardiovascular: Normal rate, regular rhythm and normal heart sounds.    Pulmonary/Chest: She is in respiratory distress. She has wheezes. She exhibits no tenderness.  Diffuse rhonchi and wheez  Abdominal: Soft. She exhibits no distension. There is no tenderness. There is no rebound and no guarding.  Neurological: She is alert and oriented to person, place, and time.  Skin: Skin is warm and dry.    ED Course  Procedures (including critical care time) Labs Review Labs Reviewed  CBC WITH DIFFERENTIAL - Abnormal; Notable for the following:    Hemoglobin 11.7 (*)    MCH 24.9 (*)    RDW 18.0 (*)    All other components within normal limits  BASIC METABOLIC PANEL - Abnormal; Notable for the following:    Glucose, Bld 108 (*)    All other components within normal limits  I-STAT ARTERIAL BLOOD GAS, ED - Abnormal; Notable for the following:    pO2, Arterial 323.0 (*)    All other components within normal limits  TROPONIN I  PRO B NATRIURETIC PEPTIDE  MAGNESIUM  URINALYSIS, ROUTINE W REFLEX MICROSCOPIC    Imaging Review Dg Chest Port 1 View  03/08/2014   CLINICAL DATA:  RESPIRATORY DISTRESS  EXAM: PORTABLE CHEST - 1 VIEW  COMPARISON:  Prior radiograph from 02/10/2014  FINDINGS: The cardiac and mediastinal silhouettes are stable in size and contour, and remain within normal limits.  The lungs are normally inflated. No airspace consolidation, pleural effusion, or pulmonary edema is identified. There is no pneumothorax.  No acute osseous abnormality identified.  IMPRESSION: No acute cardiopulmonary abnormality.   Electronically Signed   By: Rise MuBenjamin  McClintock M.D.   On: 03/08/2014 00:56     EKG Interpretation   Date/Time:  Friday March 07 2014 23:58:20 EDT Ventricular Rate:  116 PR Interval:    QRS Duration: 91 QT Interval:  472 QTC Calculation: 656 R Axis:   48 Text Interpretation:  Atrial fibrillation Paired ventricular premature  complexes Probable anteroseptal infarct, old Borderline ST elevation,  lateral leads Prolonged QT interval Baseline wander  in lead(s) I II III  aVR aVL aVF V1 V2 V3 V4 artifact Confirmed by Mckensey Berghuis, MD, [place] ([id number])  on 03/08/2014 12:04:35 AM     3:00 Pt is off of the bipap. Still has stridulous breath sounds. All results so far are reassuring. Likely anxiety related. Pt is tachycardic. No hx of DVT, PE and no risk factors for the same.    7:05 AM Reassessed patient at 6 - she is sound asleep. No stridor. Went to tell her that i am about to discharge her now, and she was awake and started having stridulous breath sounds. She feels her breathing is better. She however thinks that her pain is causing her to be anxious. She is requesting some pain meds. Advised pcp follow up.  MDM   Final diagnoses:  Panic attack  Chronic back pain    Pt comes in with cc of respiratory distress. Hx of COPD, anxiety, intubations.  She sounded tight and had some wheezing initially, but was also stridulous and the sounds could be referred sound. ABG was normal. Pt was initially placed on bipap for her resp distress. She has no hypoxia or hypercapnia per ABG.  Bipap removed. Chart reviewed, and seems like there is a component of anxiety. Pt herself admits that some of her symptoms are anxiety related.  WE still kept her in the ER for a long period of time, and reassessed frequently. She is stable for discharge.    Derwood Kaplan, MD 03/08/14 [phone]

## 2014-03-08 NOTE — ED Notes (Signed)
Pt placed on Bi-pap with continuous neb.  ABG's drawn by resp. Tech.

## 2014-03-08 NOTE — ED Notes (Signed)
ABG's results given to Dr. Rhunette CroftNanavati.

## 2014-03-08 NOTE — ED Notes (Signed)
Patient discharged with her sister via W/C discussed instructions using the teach back method. NAD noted at the time of discharge.

## 2014-03-08 NOTE — ED Notes (Signed)
Pt requesting pain meds, Dr. Nanavati made aware. 

## 2014-03-08 NOTE — Discharge Instructions (Signed)
Back Exercises Back exercises help treat and prevent back injuries. The goal of back exercises is to increase the strength of your abdominal and back muscles and the flexibility of your back. These exercises should be started when you no longer have back pain. Back exercises include:  Pelvic Tilt. Lie on your back with your knees bent. Tilt your pelvis until the lower part of your back is against the floor. Hold this position 5 to 10 sec and repeat 5 to 10 times.  Knee to Chest. Pull first 1 knee up against your chest and hold for 20 to 30 seconds, repeat this with the other knee, and then both knees. This may be done with the other leg straight or bent, whichever feels better.  Sit-Ups or Curl-Ups. Bend your knees 90 degrees. Start with tilting your pelvis, and do a partial, slow sit-up, lifting your trunk only 30 to 45 degrees off the floor. Take at least 2 to 3 seconds for each sit-up. Do not do sit-ups with your knees out straight. If partial sit-ups are difficult, simply do the above but with only tightening your abdominal muscles and holding it as directed.  Hip-Lift. Lie on your back with your knees flexed 90 degrees. Push down with your feet and shoulders as you raise your hips a couple inches off the floor; hold for 10 seconds, repeat 5 to 10 times.  Back arches. Lie on your stomach, propping yourself up on bent elbows. Slowly press on your hands, causing an arch in your low back. Repeat 3 to 5 times. Any initial stiffness and discomfort should lessen with repetition over time.  Shoulder-Lifts. Lie face down with arms beside your body. Keep hips and torso pressed to floor as you slowly lift your head and shoulders off the floor. Do not overdo your exercises, especially in the beginning. Exercises may cause you some mild back discomfort which lasts for a few minutes; however, if the pain is more severe, or lasts for more than 15 minutes, do not continue exercises until you see your caregiver.  Improvement with exercise therapy for back problems is slow.  See your caregivers for assistance with developing a proper back exercise program. Document Released: 09/08/2004 Document Revised: 10/24/2011 Document Reviewed: 06/02/2011 [facility] Patient Information 2015 [place], [place]. This information is not intended to replace advice given to you by your health care provider. Make sure you discuss any questions you have with your health care provider. Generalized Anxiety Disorder Generalized anxiety disorder (GAD) is a mental disorder. It interferes with life functions, including relationships, work, and school. GAD is different from normal anxiety, which everyone experiences at some point in their lives in response to specific life events and activities. Normal anxiety actually helps [place] prepare for and get through these life events and activities. Normal anxiety goes away after the event or activity is over.  GAD causes anxiety that is not necessarily related to specific events or activities. It also causes excess anxiety in proportion to specific events or activities. The anxiety associated with GAD is also difficult to control. GAD can vary from mild to severe. People with severe GAD can have intense waves of anxiety with physical symptoms (panic attacks).  SYMPTOMS The anxiety and worry associated with GAD are difficult to control. This anxiety and worry are related to many life events and activities and also occur more days than not for 6 months or longer. People with GAD also have three or more of the following symptoms (one or more  in children):  Restlessness.   Fatigue.  Difficulty concentrating.   Irritability.  Muscle tension.  Difficulty sleeping or unsatisfying sleep. DIAGNOSIS GAD is diagnosed through an assessment by your health care provider. Your health care provider will ask you questions aboutyour mood,physical symptoms, and events in your life. Your health care provider  may ask you about your medical history and use of alcohol or drugs, including prescription medicines. Your health care provider may also do a physical exam and blood tests. Certain medical conditions and the use of certain substances can cause symptoms similar to those associated with GAD. Your health care provider may refer you to a mental health specialist for further evaluation. TREATMENT The following therapies are usually used to treat GAD:   Medication. Antidepressant medication usually is prescribed for long-term daily control. Antianxiety medicines may be added in severe cases, especially when panic attacks occur.   Talk therapy (psychotherapy). Certain types of talk therapy can be helpful in treating GAD by providing support, education, and guidance. A form of talk therapy called cognitive behavioral therapy can teach you healthy ways to think about and react to daily life events and activities.  Stress managementtechniques. These include yoga, meditation, and exercise and can be very helpful when they are practiced regularly. A mental health specialist can help determine which treatment is best for you. Some people see improvement with one therapy. However, other people require a combination of therapies. Document Released: 11/26/2012 Document Revised: 12/16/2013 Document Reviewed: 11/26/2012 [facility] Patient Information 2015 [place]. This information is not intended to replace advice given to you by your health care provider. Make sure you discuss any questions you have with your health care provider.

## 2014-03-08 NOTE — Progress Notes (Signed)
Pt unable to complete [place] at this time. Pt on BiPap machine at this time.

## 2014-04-07 ENCOUNTER — Ambulatory Visit: Payer: Medicaid Other | Admitting: Physical Medicine & Rehabilitation

## 2014-04-13 ENCOUNTER — Encounter (HOSPITAL_COMMUNITY): Payer: Self-pay | Admitting: Emergency Medicine

## 2014-04-13 ENCOUNTER — Emergency Department (HOSPITAL_COMMUNITY): Payer: Medicaid Other

## 2014-04-13 ENCOUNTER — Inpatient Hospital Stay (HOSPITAL_COMMUNITY)
Admission: EM | Admit: 2014-04-13 | Discharge: 2014-04-14 | DRG: 192 | Disposition: A | Discharge status: Home / Self Care | Payer: Medicaid Other | Attending: Internal Medicine | Admitting: Internal Medicine

## 2014-04-13 DIAGNOSIS — F17200 Nicotine dependence, unspecified, uncomplicated: Secondary | ICD-10-CM | POA: Diagnosis present

## 2014-04-13 DIAGNOSIS — F411 Generalized anxiety disorder: Secondary | ICD-10-CM | POA: Diagnosis present

## 2014-04-13 DIAGNOSIS — J441 Chronic obstructive pulmonary disease with (acute) exacerbation: Principal | ICD-10-CM | POA: Diagnosis present

## 2014-04-13 DIAGNOSIS — Z79899 Other long term (current) drug therapy: Secondary | ICD-10-CM

## 2014-04-13 DIAGNOSIS — J383 Other diseases of vocal cords: Secondary | ICD-10-CM | POA: Diagnosis present

## 2014-04-13 DIAGNOSIS — F410 Panic disorder [episodic paroxysmal anxiety] without agoraphobia: Secondary | ICD-10-CM | POA: Diagnosis present

## 2014-04-13 DIAGNOSIS — Z9119 Patient's noncompliance with other medical treatment and regimen: Secondary | ICD-10-CM

## 2014-04-13 DIAGNOSIS — J45901 Unspecified asthma with (acute) exacerbation: Secondary | ICD-10-CM | POA: Diagnosis present

## 2014-04-13 DIAGNOSIS — R0609 Other forms of dyspnea: Secondary | ICD-10-CM

## 2014-04-13 DIAGNOSIS — R0989 Other specified symptoms and signs involving the circulatory and respiratory systems: Secondary | ICD-10-CM

## 2014-04-13 DIAGNOSIS — Z981 Arthrodesis status: Secondary | ICD-10-CM | POA: Diagnosis not present

## 2014-04-13 DIAGNOSIS — G894 Chronic pain syndrome: Secondary | ICD-10-CM | POA: Diagnosis present

## 2014-04-13 DIAGNOSIS — R0603 Acute respiratory distress: Secondary | ICD-10-CM | POA: Diagnosis present

## 2014-04-13 DIAGNOSIS — Z91199 Patient's noncompliance with other medical treatment and regimen due to unspecified reason: Secondary | ICD-10-CM

## 2014-04-13 LAB — I-STAT ARTERIAL BLOOD GAS, ED
Acid-base deficit: 3 mmol/L — ABNORMAL HIGH (ref 0.0–2.0)
BICARBONATE: 22.1 meq/L (ref 20.0–24.0)
O2 SAT: 99 %
PCO2 ART: 39.5 mmHg (ref 35.0–45.0)
PO2 ART: 167 mmHg — AB (ref 80.0–100.0)
TCO2: 23 mmol/L (ref 0–100)
pH, Arterial: 7.357 (ref 7.350–7.450)

## 2014-04-13 LAB — CBC
HEMATOCRIT: 35.7 % — AB (ref 36.0–46.0)
HEMOGLOBIN: 11.6 g/dL — AB (ref 12.0–15.0)
MCH: 24.6 pg — AB (ref 26.0–34.0)
MCHC: 32.5 g/dL (ref 30.0–36.0)
MCV: 75.6 fL — AB (ref 78.0–100.0)
Platelets: 332 10*3/uL (ref 150–400)
RBC: 4.72 MIL/uL (ref 3.87–5.11)
RDW: 16.1 % — ABNORMAL HIGH (ref 11.5–15.5)
WBC: 6.9 10*3/uL (ref 4.0–10.5)

## 2014-04-13 LAB — BASIC METABOLIC PANEL
Anion gap: 14 (ref 5–15)
BUN: 11 mg/dL (ref 6–23)
CALCIUM: 9.2 mg/dL (ref 8.4–10.5)
CO2: 20 mEq/L (ref 19–32)
Chloride: 103 mEq/L (ref 96–112)
Creatinine, Ser: 1.08 mg/dL (ref 0.50–1.10)
GFR calc Af Amer: 76 mL/min — ABNORMAL LOW (ref >90)
GFR calc non Af Amer: 65 mL/min — ABNORMAL LOW (ref >90)
Glucose, Bld: 94 mg/dL (ref 70–99)
Potassium: 4 mEq/L (ref 3.7–5.3)
SODIUM: 137 meq/L (ref 137–147)

## 2014-04-13 LAB — I-STAT TROPONIN, ED: Troponin i, poc: 0 ng/mL (ref 0.00–0.08)

## 2014-04-13 MED ORDER — SODIUM CHLORIDE 0.9 % IJ SOLN
3.0000 mL | Freq: Two times a day (BID) | INTRAMUSCULAR | Status: DC
  Administered 2014-04-14: 3 mL via INTRAVENOUS

## 2014-04-13 MED ORDER — ACETAMINOPHEN 325 MG PO TABS: 650.0000 mg | ORAL_TABLET | Freq: Four times a day (QID) | ORAL | Status: DC | PRN

## 2014-04-13 MED ORDER — ACETAMINOPHEN 650 MG RE SUPP: 650.0000 mg | Freq: Four times a day (QID) | RECTAL | Status: DC | PRN

## 2014-04-13 MED ORDER — FENTANYL CITRATE 0.05 MG/ML IJ SOLN
50.0000 ug | Freq: Once | INTRAMUSCULAR | Status: AC
  Administered 2014-04-13: 50 ug via INTRAVENOUS
  Filled 2014-04-13: qty 2

## 2014-04-13 MED ORDER — ONDANSETRON 4 MG PO TBDP
4.0000 mg | ORAL_TABLET | Freq: Four times a day (QID) | ORAL | Status: DC | PRN
  Filled 2014-04-13: qty 1

## 2014-04-13 MED ORDER — METHYLPREDNISOLONE SODIUM SUCC 125 MG IJ SOLR
INTRAMUSCULAR | Status: AC
  Filled 2014-04-13: qty 2

## 2014-04-13 MED ORDER — ALBUTEROL SULFATE (2.5 MG/3ML) 0.083% IN NEBU: 2.5000 mg | INHALATION_SOLUTION | RESPIRATORY_TRACT | Status: DC | PRN

## 2014-04-13 MED ORDER — MOMETASONE FURO-FORMOTEROL FUM 100-5 MCG/ACT IN AERO
2.0000 | INHALATION_SPRAY | Freq: Two times a day (BID) | RESPIRATORY_TRACT | Status: DC
  Administered 2014-04-13 – 2014-04-14 (×2): 2 via RESPIRATORY_TRACT
  Filled 2014-04-13: qty 8.8

## 2014-04-13 MED ORDER — MORPHINE SULFATE ER 15 MG PO TBCR
15.0000 mg | EXTENDED_RELEASE_TABLET | Freq: Two times a day (BID) | ORAL | Status: DC
  Administered 2014-04-13 – 2014-04-14 (×2): 15 mg via ORAL
  Filled 2014-04-13 (×2): qty 1

## 2014-04-13 MED ORDER — OXYCODONE HCL 5 MG PO TABS
15.0000 mg | ORAL_TABLET | ORAL | Status: DC | PRN
  Administered 2014-04-13 – 2014-04-14 (×4): 15 mg via ORAL
  Filled 2014-04-13 (×4): qty 3

## 2014-04-13 MED ORDER — RACEPINEPHRINE HCL 2.25 % IN NEBU
0.5000 mL | INHALATION_SOLUTION | Freq: Once | RESPIRATORY_TRACT | Status: AC
  Administered 2014-04-13: 0.5 mL via RESPIRATORY_TRACT
  Filled 2014-04-13: qty 0.5

## 2014-04-13 MED ORDER — BENZOCAINE 20 % MT SOLN
Freq: Once | OROMUCOSAL | Status: DC
  Filled 2014-04-13: qty 57

## 2014-04-13 MED ORDER — LEVALBUTEROL HCL 0.63 MG/3ML IN NEBU
0.6300 mg | INHALATION_SOLUTION | RESPIRATORY_TRACT | Status: DC | PRN
  Filled 2014-04-13: qty 3

## 2014-04-13 MED ORDER — IPRATROPIUM-ALBUTEROL 0.5-2.5 (3) MG/3ML IN SOLN
3.0000 mL | Freq: Once | RESPIRATORY_TRACT | Status: AC
  Administered 2014-04-13: 3 mL via RESPIRATORY_TRACT
  Filled 2014-04-13: qty 3

## 2014-04-13 MED ORDER — ONDANSETRON HCL 4 MG/2ML IJ SOLN: 4.0000 mg | Freq: Four times a day (QID) | INTRAMUSCULAR | Status: DC | PRN

## 2014-04-13 MED ORDER — LORAZEPAM 2 MG/ML IJ SOLN
1.0000 mg | Freq: Once | INTRAMUSCULAR | Status: AC
  Administered 2014-04-13: 1 mg via INTRAVENOUS
  Filled 2014-04-13: qty 1

## 2014-04-13 MED ORDER — ENOXAPARIN SODIUM 40 MG/0.4ML ~~LOC~~ SOLN
40.0000 mg | SUBCUTANEOUS | Status: DC
  Administered 2014-04-13: 40 mg via SUBCUTANEOUS
  Filled 2014-04-13 (×2): qty 0.4

## 2014-04-13 MED ORDER — MAGNESIUM SULFATE 40 MG/ML IJ SOLN
2.0000 g | Freq: Once | INTRAMUSCULAR | Status: AC
  Administered 2014-04-13: 2 g via INTRAVENOUS
  Filled 2014-04-13: qty 50

## 2014-04-13 MED ORDER — DICYCLOMINE HCL 20 MG PO TABS
20.0000 mg | ORAL_TABLET | Freq: Four times a day (QID) | ORAL | Status: DC | PRN
  Filled 2014-04-13: qty 1

## 2014-04-13 MED ORDER — IPRATROPIUM BROMIDE 0.02 % IN SOLN
0.5000 mg | Freq: Once | RESPIRATORY_TRACT | Status: AC
  Administered 2014-04-13: 0.5 mg via RESPIRATORY_TRACT
  Filled 2014-04-13: qty 2.5

## 2014-04-13 MED ORDER — METHYLPREDNISOLONE SODIUM SUCC 125 MG IJ SOLR
60.0000 mg | Freq: Four times a day (QID) | INTRAMUSCULAR | Status: DC
  Administered 2014-04-13 – 2014-04-14 (×3): 60 mg via INTRAVENOUS
  Filled 2014-04-13 (×4): qty 0.96

## 2014-04-13 MED ORDER — METHYLPREDNISOLONE SODIUM SUCC 125 MG IJ SOLR
60.0000 mg | Freq: Four times a day (QID) | INTRAMUSCULAR | Status: DC
  Filled 2014-04-13 (×2): qty 0.96

## 2014-04-13 MED ORDER — METHYLPREDNISOLONE SODIUM SUCC 125 MG IJ SOLR
125.0000 mg | Freq: Once | INTRAMUSCULAR | Status: AC
  Administered 2014-04-13: 125 mg via INTRAVENOUS

## 2014-04-13 MED ORDER — ALBUTEROL (5 MG/ML) CONTINUOUS INHALATION SOLN
10.0000 mg/h | INHALATION_SOLUTION | RESPIRATORY_TRACT | Status: DC
  Administered 2014-04-13: 10 mg/h via RESPIRATORY_TRACT
  Filled 2014-04-13: qty 20

## 2014-04-13 MED ORDER — ALPRAZOLAM 0.5 MG PO TABS
2.0000 mg | ORAL_TABLET | Freq: Four times a day (QID) | ORAL | Status: DC | PRN
  Administered 2014-04-13 – 2014-04-14 (×3): 2 mg via ORAL
  Filled 2014-04-13 (×3): qty 4

## 2014-04-13 MED ORDER — IPRATROPIUM BROMIDE 0.02 % IN SOLN: 0.5000 mg | Freq: Four times a day (QID) | RESPIRATORY_TRACT | Status: DC | PRN

## 2014-04-13 MED ORDER — ONDANSETRON HCL 4 MG PO TABS: 4.0000 mg | ORAL_TABLET | Freq: Four times a day (QID) | ORAL | Status: DC | PRN

## 2014-04-13 MED ORDER — LOPERAMIDE HCL 2 MG PO CAPS
2.0000 mg | ORAL_CAPSULE | ORAL | Status: DC | PRN
  Filled 2014-04-13: qty 2

## 2014-04-13 MED ORDER — PANTOPRAZOLE SODIUM 40 MG PO TBEC
40.0000 mg | DELAYED_RELEASE_TABLET | Freq: Two times a day (BID) | ORAL | Status: DC
  Administered 2014-04-13 – 2014-04-14 (×2): 40 mg via ORAL
  Filled 2014-04-13 (×2): qty 1

## 2014-04-13 MED ORDER — METHOCARBAMOL 500 MG PO TABS
500.0000 mg | ORAL_TABLET | Freq: Three times a day (TID) | ORAL | Status: DC | PRN
  Filled 2014-04-13: qty 1

## 2014-04-13 MED ORDER — HYDROXYZINE HCL 25 MG PO TABS
25.0000 mg | ORAL_TABLET | Freq: Four times a day (QID) | ORAL | Status: DC | PRN
  Filled 2014-04-13: qty 1

## 2014-04-13 NOTE — H&P (Signed)
PCP: Lu Duffel, MD  [facility]   Chief Complaint:  Shortness of breath  HPI: Julie Kerr is a 38 y.o. female   has a past medical history of Asthma; Anxiety; COPD (chronic obstructive pulmonary disease); and Bronchitis.   Presented with  Cough and severe shortness of breath since this afternoon. Patient states that she ran out of her Xanax and pain medication for the past 6 days. She has been having nausea diarrhea chills sweats and pain over. She started to feel more anxious and tremulous. Patient states she developed a cough and then became short of breath. Her partner is at bedside stating that and anxiety greatly contributes to her asthma exacerbations. Patient continues to smoke. Drinks occasionally denies drug abuse.  On arrival to ER patient appeared to be in respiratory distress. Intubation was considered. The patient started to improve after administration of nebulizers Ativan and fentanyl.  Patient has hx of severe asthma requiring recurrent admissions and states had last admission was 6 weeks ago requiring intubation, I could not confirm this in EPIC although  It has been documented that she had in the past needed intubation.  Patient has also vocal cord paralysis, chronic anexity and chronic back pain since spinal fusion 1 year ago. She has run out of her MS contin 30 mg po q 12h and oxycodon 15 mg po q 4hours. Of note this was not on the dictation list from last discharge. Per note in June 2015 patient is supposed to be on  MS Contin  BID and Oxycodone  tab 1 po four times daily as needed for breakthrough pain, Xanax  ACHS, Venlafaxine 37.5mg  BID  (based on conversation with her pain clinic [facility] in [place] ph: [phone], fax [phone]). Patient has been non-compliant frequently lives hospital AMA.  Today while in ER she was given Albuterol, atrovent repeated and continuous nebs, Solumedrol 125 mg IV, Ativan 1 mg IV,  Racepinephrine, MgSulfate, Hurricane spray and Fentanyl 50 IV.  At the time of evaluation patient wanted to leave AMA she was tremulous and tearful. Stating she was in a lot of pain.  Patient states her breathing is a lot better. She has run out of her pain and anxiety medications. She has been seen by dr. Sherene Sires in clinic but no prescriptions for xanax or narcotics were given. Patient is supposed to follow up with her PC and pain clinic. She is under impression that she will get her refills tomorrow. Patient is now agreeable to stay to treat her asthma   Hospitalist was called for admission for asthma exacerbaton, narcotic withdrawal, benzodiazepines withdrawal  Review of Systems:    Pertinent positives include:   chills, sweaty, pain all over diarrhea, tremor, nausea,   Constitutional:  No weight loss, night sweats, Fevers,fatigue, weight loss  HEENT:  No headaches, Difficulty swallowing,Tooth/dental problems,Sore throat,  No sneezing, itching, ear ache, nasal congestion, post nasal drip,  Cardio-vascular:  No chest pain, Orthopnea, PND, anasarca, dizziness, palpitations.no Bilateral lower extremity swelling  GI:  No heartburn, indigestion, abdominal pain, change in bowel habits, loss of appetite, melena, blood in stool, hematemesis Resp:  no shortness of breath at rest. No dyspnea on exertion, No excess mucus, no productive cough, No non-productive cough, No coughing up of blood.No change in color of mucus.No wheezing. Skin:  no rash or lesions. No jaundice GU:  no dysuria, change in color of urine, no urgency or frequency. No straining to urinate.  No flank pain.  Musculoskeletal:  No joint pain or no joint swelling. No decreased range of motion. No back pain.  Psych:  No change in mood or affect. No depression or anxiety. No memory loss.  Neuro: no localizing neurological complaints, no tingling, no weakness, no double vision, no gait abnormality, no slurred speech, no  confusion  Otherwise ROS are negative except for above, 10 systems were reviewed  Past Medical History: Past Medical History  Diagnosis Date  . Asthma   . Anxiety   . COPD (chronic obstructive pulmonary disease)   . Bronchitis    Past Surgical History  Procedure Laterality Date  . Spinal fusion       Medications: Prior to Admission medications   Medication Sig Start Date End Date Taking? Authorizing Provider  albuterol (PROVENTIL HFA;VENTOLIN HFA) 108 (90 BASE) MCG/ACT inhaler Inhale 2 puffs into the lungs every 4 (four) hours as needed for wheezing. 02/06/14  Yes Nyoka Cowden, MD  albuterol (PROVENTIL) (2.5 MG/3ML) 0.083% nebulizer solution Take 3 mLs (2.5 mg total) by nebulization every 2 (two) hours as needed for wheezing. 11/28/13  Yes Kathlen Mody, MD  alprazolam Prudy Feeler) 2 MG tablet Take 2 mg by mouth every 6 (six) hours as needed for anxiety.   Yes Historical Provider, MD  levalbuterol (XOPENEX) 0.63 MG/3ML nebulizer solution Take 3 mLs (0.63 mg total) by nebulization every 4 (four) hours as needed for wheezing or shortness of breath. 02/24/14  Yes Tammy S Parrett, NP  mometasone-formoterol (DULERA) 100-5 MCG/ACT AERO Inhale 2 puffs into the lungs 2 (two) times daily. 11/28/13  Yes Kathlen Mody, MD    Allergies:   Allergies  Allergen Reactions  . Robitussin Dm [Dextromethorphan-Guaifenesin] Nausea And Vomiting  . Rayon, Purified Hives  . Nsaids Hives  . Tramadol Hives    Social History:  Ambulatory   independently   Lives at home  With partner     reports that she has been smoking Cigarettes.  She has a 34 pack-year smoking history. She has never used smokeless tobacco. She reports that she drinks alcohol. She reports that she does not use illicit drugs.    Family History: family history includes Asthma in her sister; CVA in her father; Clotting disorder in her maternal grandmother and sister; Emphysema in her maternal grandmother; HIV in her mother; Heart disease  in her father and other; Lung cancer in her maternal grandmother.    Physical Exam: Patient Vitals for the past 24 hrs:  BP Temp Pulse Resp SpO2  04/13/14 1900 133/78 mmHg - 100 18 100 %  04/13/14 1849 126/86 mmHg - - 16 99 %  04/13/14 1830 126/68 mmHg - 108 23 97 %  04/13/14 1800 135/66 mmHg - 104 21 98 %  04/13/14 1730 143/72 mmHg - 111 28 97 %  04/13/14 1643 135/100 mmHg - - 32 100 %  04/13/14 1600 154/102 mmHg - 111 20 100 %  04/13/14 1529 - - 126 34 100 %  04/13/14 1523 - - 94 24 98 %  04/13/14 1514 125/58 mmHg - - 30 100 %  04/13/14 1504 104/74 mmHg 98.3 F (36.8 C) 99 30 93 %    1. General: Patient at first was tearful wanting to leave AMA now agreeing to stay 2. Psychological: Alert and  Oriented 3. Head/ENT:   Moist Mucous Membranes                          Head Non traumatic, neck supple  Normal  Dentition 4. SKIN: normal Skin turgor,  Skin clean Dry and intact no rash 5. Heart: Rapid Regular rate and rhythm no Murmur, Rub or gallop 6. Lungs: Somewhat diminished air movement no longer can appreciate wheezes or crackles  . ER note patient had stridor on presentation currently resolved 7. Abdomen: Soft, non-tender, Non distended 8. Lower extremities: no clubbing, cyanosis, or edema 9. Neurologically Grossly intact, moving all 4 extremities equally, tremelous 10. MSK: Normal range of motion  body mass index is unknown because there is no weight on file.   Labs on Admission:   Recent Labs  04/13/14 1515  NA 137  K 4.0  CL 103  CO2 20  GLUCOSE 94  BUN 11  CREATININE 1.08  CALCIUM 9.2   No results found for this basename: AST, ALT, ALKPHOS, BILITOT, PROT, ALBUMIN,  in the last 72 hours No results found for this basename: LIPASE, AMYLASE,  in the last 72 hours  Recent Labs  04/13/14 1515  WBC 6.9  HGB 11.6*  HCT 35.7*  MCV 75.6*  PLT 332   No results found for this basename: CKTOTAL, CKMB, CKMBINDEX, TROPONINI,  in the last 72  hours No results found for this basename: TSH, T4TOTAL, FREET3, T3FREE, THYROIDAB,  in the last 72 hours No results found for this basename: VITAMINB12, FOLATE, FERRITIN, TIBC, IRON, RETICCTPCT,  in the last 72 hours Lab Results  Component Value Date   HGBA1C 5.6 11/28/2013    The CrCl is unknown because both a height and weight (above a minimum accepted value) are required for this calculation. ABG    Component Value Date/Time   PHART 7.357 04/13/2014 1617   HCO3 22.1 04/13/2014 1617   TCO2 23 04/13/2014 1617   ACIDBASEDEF 3.0* 04/13/2014 1617   O2SAT 99.0 04/13/2014 1617     Lab Results  Component Value Date   DDIMER <0.27 11/26/2013     Other results:  I have pearsonaly reviewed this: ECG REPORT  Rate: 95   Rhythm: SR ST&T Change: no ischemia   BNP (last 3 results)  Recent Labs  10/29/13 0220 11/26/13 0500 03/07/14 2355  PROBNP 8.1 10.4 <5.0    There were no vitals filed for this visit.   Cultures:    Component Value Date/Time   SDES BLOOD HAND LEFT 09/06/2013 1715   SPECREQUEST BOTTLES DRAWN AEROBIC AND ANAEROBIC 10CC 09/06/2013 1715   CULT  Value: NO GROWTH 5 DAYS Performed at [facility] 09/06/2013 1715   REPTSTATUS 09/12/2013 FINAL 09/06/2013 1715      Radiological Exams on Admission: Dg Chest Port 1 View  04/13/2014   CLINICAL DATA:  38 year old female with shortness of breath.  EXAM: PORTABLE CHEST - 1 VIEW  COMPARISON:  03/08/2014 and multiple prior chest radiographs.  FINDINGS: The cardiomediastinal silhouette is unremarkable.  Minimal subsegmental right basilar atelectasis noted.  Mild peribronchial thickening is unchanged.  There is no evidence of focal airspace disease, pulmonary edema, suspicious pulmonary nodule/mass, pleural effusion, or pneumothorax. No acute bony abnormalities are identified.  IMPRESSION: Minimal right basilar subsegmental atelectasis and mild chronic peribronchial thickening.   Electronically Signed   By: Laveda Abbe M.D.   On:  04/13/2014 15:55    Chart has been reviewed  Assessment/Plan  38 yo F w hx of poorly controlled asthma and vocal chord paralyses and medical non-compliance, chronic pain and anxiety disorder.   Present on Admission:  . Asthma exacerbation - hx of noncompliance, social work consult, given hx of  intubation and severe presentation will admit to stepdown on Solumedrol  q6, albuterol nebs, atrovent, dulera. Most likley has anxiety and psychogenic component.  . Vocal cord dysfunction - monior in stepdown . Tobacco use disorder - spoke about importance of quitting states she is trying . Respiratory distress - currently much improved . Generalized anxiety disorder - restart xanax to avoid withdrawal, will need to be swithced to something with less potential for abuse . Chronic pain syndrome - restart MScontin 15 q 12 and oxycodone 15 q4 h as per prior note. Will need to confirm situation with pain clinic in AM. Avoid IV narcotics.    Prophylaxis:  Lovenox, Protonix  CODE STATUS:  FULL CODE    Other plan as per orders.  I have spent a total of 65 min on this admission extra time was taken to discuss care with pulmonology  Earley Grobe 04/13/2014, 7:17 PM  [facility]  Pager [phone]   If 7AM-7PM, please contact the day team taking care of the patient  [email or web address]  Password [email or web address]

## 2014-04-13 NOTE — ED Provider Notes (Signed)
CSN: [id number]     Arrival date & time 04/13/14  1459 History   First MD Initiated Contact with Patient 04/13/14 1529     Chief Complaint  Patient presents with  . Asthma  . Shortness of Breath     (Consider location/radiation/quality/duration/timing/severity/associated sxs/prior Treatment) Patient is a 38 y.o. female presenting with shortness of breath. The history is provided by the patient.  Shortness of Breath Severity:  Severe Onset quality:  Gradual Duration:  1 day Timing:  Constant Progression:  Worsening Chronicity:  Recurrent Context comment:  Nothing specific, patient was home and started having shortness of breath Relieved by:  Nothing Worsened by:  Activity Ineffective treatments:  Inhaler Associated symptoms comment:  Limited due to respiratory distress Risk factors: obesity   Risk factors: no hx of PE/DVT, no oral contraceptive use, no prolonged immobilization, no recent surgery and no tobacco use     Past Medical History  Diagnosis Date  . Asthma   . Anxiety   . COPD (chronic obstructive pulmonary disease)   . Bronchitis    Past Surgical History  Procedure Laterality Date  . Spinal fusion     Family History  Problem Relation Age of Onset  . HIV Mother   . Heart disease Father   . CVA Father   . Heart disease Other   . Emphysema Maternal Grandmother     smoked  . Asthma Sister   . Clotting disorder Sister   . Clotting disorder Maternal Grandmother   . Lung cancer Maternal Grandmother     smoked   History  Substance Use Topics  . Smoking status: Former Smoker -- 2.00 packs/day for 17 years    Types: Cigarettes    Quit date: 08/16/2013  . Smokeless tobacco: Never Used  . Alcohol Use: Yes     Comment: occasional   OB History   Grav Para Term Preterm Abortions TAB SAB Ect Mult Living                 Review of Systems  Unable to perform ROS: Acuity of condition  Respiratory: Positive for shortness of breath.       Allergies   Robitussin dm; Rayon, purified; Nsaids; and Tramadol  Home Medications   Prior to Admission medications   Medication Sig Start Date End Date Taking? Authorizing Provider  acetaminophen (TYLENOL) 500 MG tablet Take 1,000 mg by mouth every 6 (six) hours as needed for fever.    Historical Provider, MD  albuterol (PROVENTIL HFA;VENTOLIN HFA) 108 (90 BASE) MCG/ACT inhaler Inhale 2 puffs into the lungs every 4 (four) hours as needed for wheezing. 02/06/14   Nyoka Cowden, MD  albuterol (PROVENTIL) (2.5 MG/3ML) 0.083% nebulizer solution Take 3 mLs (2.5 mg total) by nebulization every 2 (two) hours as needed for wheezing. 11/28/13   Kathlen Mody, MD  alprazolam Prudy Feeler) 2 MG tablet Take 2 mg by mouth every 6 (six) hours as needed for anxiety.    Historical Provider, MD  HYDROcodone-acetaminophen (NORCO/VICODIN) 5-325 MG per tablet Take 1 tablet by mouth every 6 (six) hours as needed. 03/08/14   Derwood Kaplan, MD  levalbuterol (XOPENEX) 0.63 MG/3ML nebulizer solution Take 3 mLs (0.63 mg total) by nebulization every 4 (four) hours as needed for wheezing or shortness of breath. 02/24/14   Tammy S Parrett, NP  LORazepam (ATIVAN) 1 MG tablet Take 1 tablet (1 mg total) by mouth 3 (three) times daily as needed for anxiety. 03/08/14   Derwood Kaplan, MD  mometasone-formoterol (  DULERA) 100-5 MCG/ACT AERO Inhale 2 puffs into the lungs 2 (two) times daily. 11/28/13   Kathlen Mody, MD  morphine (MSIR) 30 MG tablet Take 30 mg by mouth every 6 (six) hours as needed for severe pain.    Historical Provider, MD  Oxycodone HCl 20 MG TABS Take 20 mg by mouth every 4 (four) hours as needed (for breakthrough pain).    Historical Provider, MD   BP 154/102  Pulse 111  Temp(Src) 98.3 F (36.8 C)  Resp 20  SpO2 100% Physical Exam  Constitutional: She appears well-developed and well-nourished. She appears distressed.  HENT:  Head: Normocephalic and atraumatic.  Right Ear: External ear normal.  Left Ear: External ear normal.   Eyes: Conjunctivae and EOM are normal. Right eye exhibits no discharge. Left eye exhibits no discharge.  Neck: Normal range of motion. Neck supple. No JVD present.  Cardiovascular: Normal rate, regular rhythm and normal heart sounds.  Exam reveals no gallop and no friction rub.   No murmur heard. Pulmonary/Chest: Accessory muscle usage present. No stridor. Tachypnea noted. She is in respiratory distress. She has decreased breath sounds. She has wheezes. She has rhonchi. She has no rales. She exhibits no tenderness.  Abdominal: Soft. Bowel sounds are normal. She exhibits no distension. There is no tenderness. There is no rebound and no guarding.  Musculoskeletal: Normal range of motion. She exhibits no edema.  Neurological: She is alert.  Skin: Skin is warm. No rash noted. She is not diaphoretic.  Psychiatric: She has a normal mood and affect. Her behavior is normal.    ED Course  Procedures (including critical care time) Labs Review Labs Reviewed  BASIC METABOLIC PANEL - Abnormal; Notable for the following:    GFR calc non Af Amer 65 (*)    GFR calc Af Amer 76 (*)    All other components within normal limits  CBC - Abnormal; Notable for the following:    Hemoglobin 11.6 (*)    HCT 35.7 (*)    MCV 75.6 (*)    MCH 24.6 (*)    RDW 16.1 (*)    All other components within normal limits  I-STAT ARTERIAL BLOOD GAS, ED - Abnormal; Notable for the following:    pO2, Arterial 167.0 (*)    Acid-base deficit 3.0 (*)    All other components within normal limits  Rosezena Sensor, ED    Imaging Review Dg Chest Port 1 View  04/13/2014   CLINICAL DATA:  38 year old female with shortness of breath.  EXAM: PORTABLE CHEST - 1 VIEW  COMPARISON:  03/08/2014 and multiple prior chest radiographs.  FINDINGS: The cardiomediastinal silhouette is unremarkable.  Minimal subsegmental right basilar atelectasis noted.  Mild peribronchial thickening is unchanged.  There is no evidence of focal airspace  disease, pulmonary edema, suspicious pulmonary nodule/mass, pleural effusion, or pneumothorax. No acute bony abnormalities are identified.  IMPRESSION: Minimal right basilar subsegmental atelectasis and mild chronic peribronchial thickening.   Electronically Signed   By: Laveda Abbe M.D.   On: 04/13/2014 15:55     EKG Interpretation None      MDM   Final diagnoses:  None    Pt with likely severe asthma exacerbation at home. History limited due to resp distress. Started Continuous bronchodilator tx, racemic epi, magnesium, and steroids. ABG not sig abn. Likely a component of anxiety, gave 1 mg ativan for this. CXR neg for PNA, PTX or pulm edema. Symptoms did improve. Also gave hurrcaine spray with improvement of her  cough. She has ongoing wheezing and some tachypnea. Admission for an acute asthma exacerbation is indicated. She was requesting multiple narcotics and benzos as she reports this is her usual regimen. She was admitted without any events for status asthmaticus. Care discussed with my attending. Doubt PE, ACS  - as sx improved with usual asthma care.     Sena Hitch, MD 04/13/14 [phone]

## 2014-04-13 NOTE — ED Notes (Signed)
Pts breathing has eased at this time. Able to speak a little easier. States that her back is hurting and she wants something for pain. Will inform provider.

## 2014-04-13 NOTE — ED Notes (Signed)
EDP brought to the bedside due to pts distress.

## 2014-04-13 NOTE — Progress Notes (Signed)
eLink Physician-Brief Progress Note Patient Name: Julie Kerr DOB: [birth year] MRN: [id number]   Date of Service  04/13/2014  HPI/Events of Note  asked to check on patient by hospitalist service.  Patient admitted with asthma exacerbation vs pseudoasthma from VCD and GAD.  Patient resting comfortably with normal O2sat on room air.    eICU Interventions  No changes at this time Likely can taper steroids quickly Smoking cessation     Intervention Category Intermediate Interventions: Other:  Henry Russel, P 04/13/2014, 10:12 PM

## 2014-04-13 NOTE — ED Notes (Signed)
Pt alert, NAD, calm, interactive, resps e/u, speaking in soft voice, no dyspnea noted, no audible airway noises, admits to pain, (denies: sob or nausea), report called to Tim, RN 3S, family at [facility].

## 2014-04-13 NOTE — ED Notes (Signed)
Per pt sts a few hours ago wheezing and SOB. Pt in distress at triage. Unable to speak.

## 2014-04-14 LAB — MAGNESIUM: MAGNESIUM: 2.3 mg/dL (ref 1.5–2.5)

## 2014-04-14 LAB — URINALYSIS, ROUTINE W REFLEX MICROSCOPIC
BILIRUBIN URINE: NEGATIVE
GLUCOSE, UA: 500 mg/dL — AB
Hgb urine dipstick: NEGATIVE
Ketones, ur: NEGATIVE mg/dL
Leukocytes, UA: NEGATIVE
Nitrite: NEGATIVE
PROTEIN: NEGATIVE mg/dL
Specific Gravity, Urine: 1.018 (ref 1.005–1.030)
Urobilinogen, UA: 0.2 mg/dL (ref 0.0–1.0)
pH: 5 (ref 5.0–8.0)

## 2014-04-14 LAB — COMPREHENSIVE METABOLIC PANEL
ALBUMIN: 3.6 g/dL (ref 3.5–5.2)
ALK PHOS: 66 U/L (ref 39–117)
ALT: 10 U/L (ref 0–35)
AST: 10 U/L (ref 0–37)
Anion gap: 18 — ABNORMAL HIGH (ref 5–15)
BUN: 10 mg/dL (ref 6–23)
CO2: 17 mEq/L — ABNORMAL LOW (ref 19–32)
Calcium: 9.7 mg/dL (ref 8.4–10.5)
Chloride: 100 mEq/L (ref 96–112)
Creatinine, Ser: 0.81 mg/dL (ref 0.50–1.10)
GFR calc Af Amer: >90 mL/min (ref >90)
GFR calc non Af Amer: >90 mL/min (ref >90)
Glucose, Bld: 308 mg/dL — ABNORMAL HIGH (ref 70–99)
POTASSIUM: 3.7 meq/L (ref 3.7–5.3)
Sodium: 135 mEq/L — ABNORMAL LOW (ref 137–147)
TOTAL PROTEIN: 7.2 g/dL (ref 6.0–8.3)
Total Bilirubin: 0.2 mg/dL — ABNORMAL LOW (ref 0.3–1.2)

## 2014-04-14 LAB — CBC
HCT: 33.9 % — ABNORMAL LOW (ref 36.0–46.0)
Hemoglobin: 10.7 g/dL — ABNORMAL LOW (ref 12.0–15.0)
MCH: 24.8 pg — AB (ref 26.0–34.0)
MCHC: 31.6 g/dL (ref 30.0–36.0)
MCV: 78.7 fL (ref 78.0–100.0)
Platelets: 277 10*3/uL (ref 150–400)
RBC: 4.31 MIL/uL (ref 3.87–5.11)
RDW: 16.3 % — ABNORMAL HIGH (ref 11.5–15.5)
WBC: 12 10*3/uL — ABNORMAL HIGH (ref 4.0–10.5)

## 2014-04-14 LAB — RAPID URINE DRUG SCREEN, HOSP PERFORMED
Amphetamines: NOT DETECTED
BENZODIAZEPINES: NOT DETECTED
Barbiturates: NOT DETECTED
COCAINE: NOT DETECTED
OPIATES: POSITIVE — AB
TETRAHYDROCANNABINOL: NOT DETECTED

## 2014-04-14 LAB — TSH: TSH: 0.246 u[IU]/mL — ABNORMAL LOW (ref 0.350–4.500)

## 2014-04-14 LAB — PHOSPHORUS: PHOSPHORUS: 2.4 mg/dL (ref 2.3–4.6)

## 2014-04-14 LAB — MRSA PCR SCREENING: MRSA by PCR: NEGATIVE

## 2014-04-14 MED ORDER — WHITE PETROLATUM GEL
Status: AC
  Filled 2014-04-14: qty 5

## 2014-04-14 MED ORDER — HYDROXYZINE HCL 25 MG PO TABS
25.0000 mg | ORAL_TABLET | Freq: Four times a day (QID) | ORAL | Status: DC | PRN
  Filled 2014-04-14: qty 1

## 2014-04-14 MED ORDER — ALPRAZOLAM 2 MG PO TABS: 2.0000 mg | ORAL_TABLET | Freq: Four times a day (QID) | ORAL | Status: DC | PRN

## 2014-04-14 NOTE — Discharge Instructions (Signed)
Generalized Anxiety Disorder Generalized anxiety disorder (GAD) is a mental disorder. It interferes with life functions, including relationships, work, and school. GAD is different from normal anxiety, which everyone experiences at some point in their lives in response to specific life events and activities. Normal anxiety actually helps [place] prepare for and get through these life events and activities. Normal anxiety goes away after the event or activity is over.  GAD causes anxiety that is not necessarily related to specific events or activities. It also causes excess anxiety in proportion to specific events or activities. The anxiety associated with GAD is also difficult to control. GAD can vary from mild to severe. People with severe GAD can have intense waves of anxiety with physical symptoms (panic attacks).  SYMPTOMS The anxiety and worry associated with GAD are difficult to control. This anxiety and worry are related to many life events and activities and also occur more days than not for 6 months or longer. People with GAD also have three or more of the following symptoms (one or more in children):  Restlessness.   Fatigue.  Difficulty concentrating.   Irritability.  Muscle tension.  Difficulty sleeping or unsatisfying sleep. DIAGNOSIS GAD is diagnosed through an assessment by your health care provider. Your health care provider will ask you questions aboutyour mood,physical symptoms, and events in your life. Your health care provider may ask you about your medical history and use of alcohol or drugs, including prescription medicines. Your health care provider may also do a physical exam and blood tests. Certain medical conditions and the use of certain substances can cause symptoms similar to those associated with GAD. Your health care provider may refer you to a mental health specialist for further evaluation. TREATMENT The following therapies are usually used to treat GAD:    Medication. Antidepressant medication usually is prescribed for long-term daily control. Antianxiety medicines may be added in severe cases, especially when panic attacks occur.   Talk therapy (psychotherapy). Certain types of talk therapy can be helpful in treating GAD by providing support, education, and guidance. A form of talk therapy called cognitive behavioral therapy can teach you healthy ways to think about and react to daily life events and activities.  Stress managementtechniques. These include yoga, meditation, and exercise and can be very helpful when they are practiced regularly. A mental health specialist can help determine which treatment is best for you. Some people see improvement with one therapy. However, other people require a combination of therapies. Document Released: 11/26/2012 Document Revised: 12/16/2013 Document Reviewed: 11/26/2012 [facility] Patient Information 2015 [place], [place]. This information is not intended to replace advice given to you by your health care provider. Make sure you discuss any questions you have with your health care provider.  Asthma Asthma is a recurring condition in which the airways tighten and narrow. Asthma can make it difficult to breathe. It can cause coughing, wheezing, and shortness of breath. Asthma episodes, also called asthma attacks, range from minor to life-threatening. Asthma cannot be cured, but medicines and lifestyle changes can help control it. CAUSES Asthma is believed to be caused by inherited (genetic) and environmental factors, but its exact cause is unknown. Asthma may be triggered by allergens, lung infections, or irritants in the air. Asthma triggers are different for each person. Common triggers include:   Animal dander.  Dust mites.  Cockroaches.  Pollen from trees or grass.  Mold.  Smoke.  Air pollutants such as dust, household cleaners, hair sprays, aerosol sprays, paint fumes, strong  chemicals, or  strong odors.  Cold air, weather changes, and winds (which increase molds and pollens in the air).  Strong emotional expressions such as crying or laughing hard.  Stress.  Certain medicines (such as aspirin) or types of drugs (such as beta-blockers).  Sulfites in foods and drinks. Foods and drinks that may contain sulfites include dried fruit, potato chips, and sparkling grape juice.  Infections or inflammatory conditions such as the flu, a cold, or an inflammation of the nasal membranes (rhinitis).  Gastroesophageal reflux disease (GERD).  Exercise or strenuous activity. SYMPTOMS Symptoms may occur immediately after asthma is triggered or many hours later. Symptoms include:  Wheezing.  Excessive nighttime or early morning coughing.  Frequent or severe coughing with a common cold.  Chest tightness.  Shortness of breath. DIAGNOSIS  The diagnosis of asthma is made by a review of your medical history and a physical exam. Tests may also be performed. These may include:  Lung function studies. These tests show how much air you breathe in and out.  Allergy tests.  Imaging tests such as X-rays. TREATMENT  Asthma cannot be cured, but it can usually be controlled. Treatment involves identifying and avoiding your asthma triggers. It also involves medicines. There are 2 classes of medicine used for asthma treatment:   Controller medicines. These prevent asthma symptoms from occurring. They are usually taken every day.  Reliever or rescue medicines. These quickly relieve asthma symptoms. They are used as needed and provide short-term relief. Your health care provider will help you create an asthma action plan. An asthma action plan is a written plan for managing and treating your asthma attacks. It includes a list of your asthma triggers and how they may be avoided. It also includes information on when medicines should be taken and when their dosage should be changed. An action plan  may also involve the use of a device called a peak flow meter. A peak flow meter measures how well the lungs are working. It helps you monitor your condition. HOME CARE INSTRUCTIONS   Take medicines only as directed by your health care provider. Speak with your health care provider if you have questions about how or when to take the medicines.  Use a peak flow meter as directed by your health care provider. Record and keep track of readings.  Understand and use the action plan to help minimize or stop an asthma attack without needing to seek medical care.  Control your home environment in the following ways to help prevent asthma attacks:  Do not smoke. Avoid being exposed to secondhand smoke.  Change your heating and air conditioning filter regularly.  Limit your use of fireplaces and wood stoves.  Get rid of pests (such as roaches and mice) and their droppings.  Throw away plants if you see mold on them.  Clean your floors and dust regularly. Use unscented cleaning products.  Try to have someone else vacuum for you regularly. Stay out of rooms while they are being vacuumed and for a short while afterward. If you vacuum, use a dust mask from a hardware store, a double-layered or microfilter vacuum cleaner bag, or a vacuum cleaner with a HEPA filter.  Replace carpet with wood, tile, or vinyl flooring. Carpet can trap dander and dust.  Use allergy-proof pillows, mattress covers, and box spring covers.  Wash bed sheets and blankets every week in hot water and dry them in a dryer.  Use blankets that are made of polyester or cotton.  Clean bathrooms and kitchens with bleach. If possible, have someone repaint the walls in these rooms with mold-resistant paint. Keep out of the rooms that are being cleaned and painted.  Wash hands frequently. SEEK MEDICAL CARE IF:   You have wheezing, shortness of breath, or a cough even if taking medicine to prevent attacks.  The colored mucus you  cough up (sputum) is thicker than usual.  Your sputum changes from clear or white to yellow, green, gray, or bloody.  You have any problems that may be related to the medicines you are taking (such as a rash, itching, swelling, or trouble breathing).  You are using a reliever medicine more than 2-3 times per week.  Your peak flow is still at 50-79% of your personal best after following your action plan for 1 hour.  You have a fever. SEEK IMMEDIATE MEDICAL CARE IF:   You seem to be getting worse and are unresponsive to treatment during an asthma attack.  You are short of breath even at rest.  You get short of breath when doing very little physical activity.  You have difficulty eating, drinking, or talking due to asthma symptoms.  You develop chest pain.  You develop a fast heartbeat.  You have a bluish color to your lips or fingernails.  You are light-headed, dizzy, or faint.  Your peak flow is less than 50% of your personal best. MAKE SURE YOU:   Understand these instructions.  Will watch your condition.  Will get help right away if you are not doing well or get worse. Document Released: 08/01/2005 Document Revised: 12/16/2013 Document Reviewed: 02/28/2013 [facility] Patient Information 2015 [place], [place]. This information is not intended to replace advice given to you by your health care provider. Make sure you discuss any questions you have with your health care provider.

## 2014-04-14 NOTE — Progress Notes (Signed)
Discussed discharge instructions with pt. Pt expressed need for an appointment with psychologist. Dr. Sharon Seller not able to set pt up with psych but encouraged pt to discuss it with PCP at 1 week follow-up. Educated pt on resources available in the event that she needs immediate help or has suicidal thoughts. Pt verbalizes understanding that she can call 911, go to the ED or [facility], and call any of the crisis hotlines available in the AVS. Pt verbalizes understanding of all other discharge orders. Notified central telemetry of discharge and pt has been taken off monitor. Removed IV. Prescriptions given. No personal belongings with pt. Pt transported to car via wheelchair.

## 2014-04-14 NOTE — Progress Notes (Signed)
Inpatient Diabetes Program Recommendations  AACE/ADA: New Consensus Statement on Inpatient Glycemic Control (2013)  Target Ranges:  Prepandial:   less than 140 mg/dL      Peak postprandial:   less than 180 mg/dL (1-2 hours)      Critically ill patients:  140 - 180 mg/dL   Reason for Assessment:  Results for Julie Kerr, Julie Kerr (MRN [id number]) as of 04/14/2014 09:57  Ref. Range 04/13/2014 15:15 04/14/2014 03:50  Glucose Latest Range: 70-99 mg/dL 94 409 (H)   CBG's increased with steroid therapy, No history of diabetes.  Please consider adding Novolog correction while patient is in the hospital.  A1C in the past has been normal.    Beryl Meager, RN, BC-ADM Inpatient Diabetes Coordinator Pager [phone]

## 2014-04-14 NOTE — Progress Notes (Signed)
Utilization review completed.  

## 2014-04-14 NOTE — Discharge Summary (Signed)
DISCHARGE SUMMARY  Julie Kerr  MR#: [id number]  DOB:June 01, 1976  Date of Admission: 04/13/2014 Date of Discharge: 04/14/2014  Attending Physician:Meliza Kage T  Patient's NWG:NFAOZ,HYQMVHQ, MD  Disposition: D/C home   Follow-up Appts:     Follow-up Information   Follow up with WELLS,WENDELL, MD. Schedule an appointment as soon as possible for a visit in 1 week.   Specialty:  Family Medicine     Discharge Diagnoses: Asthma exacerbation  Vocal cord dysfunction  Tobacco use disorder  Generalized anxiety disorder w/ acute panic attack  Chronic pain syndrome   Initial presentation: 38 yo female w/ a past medical history of Asthma w/ VCD; Anxiety; COPD; and Bronchitis who presented with cough and severe shortness of breath. Patient stated she ran out of her Xanax and pain medication for 6 days. She had been having nausea diarrhea chills sweats and pain all over. She started to feel more anxious and tremulous, developed a cough, and then became short of breath. Patient continues to smoke.   On arrival to ER patient appeared to be in respiratory distress. Intubation was considered. The patient started to improve after nebulizers Ativan and fentanyl. Patient has hx of severe asthma requiring recurrent admissions. Patient has vocal cord dysfunction, chronic anexity, and chronic back pain since a spinal fusion 1 year ago. She had run out of her MS contin 30 mg po q 12h and oxycodon 15 mg po q 4hours. Of note this was not on the dictation list from her last discharge.   Hospital Course: After admission, and resumption of narcotics and benzos, the pt rapidly improved.  By the time of the first f/u visit, the pt had become very anxious to be d/c home.  Her exam at that time was notable for entirely clear lung fields w/ good air moment th/o with no wheezing whatsoever.  She has tearful due to a stressful situation at home regarding her son per her report.  She was offered a prolonged  hospital stay to better address her poorly controlled anxiety (the apparent underlying cause to her episode of SOB) but she refused.  She was provided with #60  Xanax as per her previously noted Rx (prior d/c summary) but instructed that no narcotics would be provided.  She is to f/u w/ her usual pain clinic.  She is provided with contact information for local Psychiatric services, and it was explained that her Xanax Rx from the d/c MD was a one time Rx that would not be refilled or provided again after future hospitalizations.     Medication List    STOP taking these medications       levalbuterol 0.63 MG/3ML nebulizer solution  Commonly known as:  XOPENEX      TAKE these medications       albuterol (2.5 MG/3ML) 0.083% nebulizer solution  Commonly known as:  PROVENTIL  Take 3 mLs (2.5 mg total) by nebulization every 2 (two) hours as needed for wheezing.     albuterol 108 (90 BASE) MCG/ACT inhaler  Commonly known as:  PROVENTIL HFA;VENTOLIN HFA  Inhale 2 puffs into the lungs every 4 (four) hours as needed for wheezing.     alprazolam 2 MG tablet  Commonly known as:  XANAX  Take 1 tablet (2 mg total) by mouth every 6 (six) hours as needed for anxiety.     mometasone-formoterol 100-5 MCG/ACT Aero  Commonly known as:  DULERA  Inhale 2 puffs into the lungs 2 (two) times daily.  Day of Discharge BP 108/65  Pulse 102  Temp(Src) 98.4 F (36.9 C) (Oral)  Resp 27  Ht  (1.575 m)  Wt 84.3 kg (185 lb 13.6 oz)  BMI 33.98 kg/m2  SpO2 100%  Physical Exam: General: No acute respiratory distress Lungs: Clear to auscultation bilaterally without wheezes or crackles Cardiovascular: Regular rate and rhythm without murmur gallop or rub normal S1 and S2 Abdomen: Nontender, nondistended, soft, bowel sounds positive, no rebound, no ascites, no appreciable mass Extremities: No significant cyanosis, clubbing, or edema bilateral lower extremities  Results for orders placed during  the hospital encounter of 04/13/14 (from the past 24 hour(s))  I-STAT ARTERIAL BLOOD GAS, ED     Status: Abnormal   Collection Time    04/13/14  4:17 PM      Result Value Ref Range   pH, Arterial 7.357  7.350 - 7.450   pCO2 arterial 39.5  35.0 - 45.0 mmHg   pO2, Arterial 167.0 (*) 80.0 - 100.0 mmHg   Bicarbonate 22.1  20.0 - 24.0 mEq/L   TCO2 23  0 - 100 mmol/L   O2 Saturation 99.0     Acid-base deficit 3.0 (*) 0.0 - 2.0 mmol/L   Collection site RADIAL, ALLEN'S TEST ACCEPTABLE     Drawn by RT     Sample type ARTERIAL    URINE RAPID DRUG SCREEN (HOSP PERFORMED)     Status: Abnormal   Collection Time    04/13/14 11:54 PM      Result Value Ref Range   Opiates POSITIVE (*) NONE DETECTED   Cocaine NONE DETECTED  NONE DETECTED   Benzodiazepines NONE DETECTED  NONE DETECTED   Amphetamines NONE DETECTED  NONE DETECTED   Tetrahydrocannabinol NONE DETECTED  NONE DETECTED   Barbiturates NONE DETECTED  NONE DETECTED  URINALYSIS, ROUTINE W REFLEX MICROSCOPIC     Status: Abnormal   Collection Time    04/13/14 11:54 PM      Result Value Ref Range   Color, Urine YELLOW  YELLOW   APPearance CLEAR  CLEAR   Specific Gravity, Urine 1.018  1.005 - 1.030   pH 5.0  5.0 - 8.0   Glucose, UA 500 (*) NEGATIVE mg/dL   Hgb urine dipstick NEGATIVE  NEGATIVE   Bilirubin Urine NEGATIVE  NEGATIVE   Ketones, ur NEGATIVE  NEGATIVE mg/dL   Protein, ur NEGATIVE  NEGATIVE mg/dL   Urobilinogen, UA 0.2  0.0 - 1.0 mg/dL   Nitrite NEGATIVE  NEGATIVE   Leukocytes, UA NEGATIVE  NEGATIVE  MAGNESIUM     Status: None   Collection Time    04/14/14  3:50 AM      Result Value Ref Range   Magnesium 2.3  1.5 - 2.5 mg/dL  PHOSPHORUS     Status: None   Collection Time    04/14/14  3:50 AM      Result Value Ref Range   Phosphorus 2.4  2.3 - 4.6 mg/dL  TSH     Status: Abnormal   Collection Time    04/14/14  3:50 AM      Result Value Ref Range   TSH 0.246 (*) 0.350 - 4.500 uIU/mL  COMPREHENSIVE METABOLIC PANEL      Status: Abnormal   Collection Time    04/14/14  3:50 AM      Result Value Ref Range   Sodium 135 (*) 137 - 147 mEq/L   Potassium 3.7  3.7 - 5.3 mEq/L   Chloride 100  96 -  112 mEq/L   CO2 17 (*) 19 - 32 mEq/L   Glucose, Bld 308 (*) 70 - 99 mg/dL   BUN 10  6 - 23 mg/dL   Creatinine, Ser 1.61  0.50 - 1.10 mg/dL   Calcium 9.7  8.4 - 09.6 mg/dL   Total Protein 7.2  6.0 - 8.3 g/dL   Albumin 3.6  3.5 - 5.2 g/dL   AST 10  0 - 37 U/L   ALT 10  0 - 35 U/L   Alkaline Phosphatase 66  39 - 117 U/L   Total Bilirubin 0.2 (*) 0.3 - 1.2 mg/dL   GFR calc non Af Amer >90  >90 mL/min   GFR calc Af Amer >90  >90 mL/min   Anion gap 18 (*) 5 - 15  CBC     Status: Abnormal   Collection Time    04/14/14  3:50 AM      Result Value Ref Range   WBC 12.0 (*) 4.0 - 10.5 K/uL   RBC 4.31  3.87 - 5.11 MIL/uL   Hemoglobin 10.7 (*) 12.0 - 15.0 g/dL   HCT 04.5 (*) 40.9 - 81.1 %   MCV 78.7  78.0 - 100.0 fL   MCH 24.8 (*) 26.0 - 34.0 pg   MCHC 31.6  30.0 - 36.0 g/dL   RDW 91.4 (*) 78.2 - 95.6 %   Platelets 277  150 - 400 K/uL  MRSA PCR SCREENING     Status: None   Collection Time    04/14/14  5:25 AM      Result Value Ref Range   MRSA by PCR NEGATIVE  NEGATIVE    Time spent in discharge (includes decision making & examination of pt): 30 minutes  04/14/2014, 4:14 PM   Lonia Blood, MD [facility] Office  [phone] Pager [phone]  On-Call/Text Page:      [email or web address]      password [facility]

## 2014-04-15 LAB — URINE CULTURE: Colony Count: 2000

## 2014-04-16 NOTE — ED Provider Notes (Signed)
I saw and evaluated the patient, reviewed the resident's note and I agree with the findings and plan.   .Face to face Exam:  General:  Awake HEENT:  Atraumatic Resp: Mark respiratory distress with stridor. Abd:  Nondistended Neuro:No focal weakness  CRITICAL CARE Performed by: Nelva Nay L Total critical care time: 30 min Critical care time was exclusive of separately billable procedures and treating other patients. Critical care was necessary to treat or prevent imminent or life-threatening deterioration. Critical care was time spent personally by me on the following activities: development of treatment plan with patient and/or surrogate as well as nursing, discussions with consultants, evaluation of patient's response to treatment, examination of patient, obtaining history from patient or surrogate, ordering and performing treatments and interventions, ordering and review of laboratory studies, ordering and review of radiographic studies, pulse oximetry and re-evaluation of patient's condition.   Nelia Shi, MD 04/16/14 [id number]

## 2014-05-06 ENCOUNTER — Observation Stay (HOSPITAL_COMMUNITY)
Admission: EM | Admit: 2014-05-06 | Discharge: 2014-05-07 | Disposition: A | Discharge status: Home / Self Care | Payer: Medicaid Other | Attending: Internal Medicine | Admitting: Internal Medicine

## 2014-05-06 ENCOUNTER — Encounter (HOSPITAL_COMMUNITY): Payer: Self-pay | Admitting: Emergency Medicine

## 2014-05-06 ENCOUNTER — Emergency Department (HOSPITAL_COMMUNITY): Payer: Medicaid Other

## 2014-05-06 DIAGNOSIS — D649 Anemia, unspecified: Secondary | ICD-10-CM | POA: Diagnosis not present

## 2014-05-06 DIAGNOSIS — K219 Gastro-esophageal reflux disease without esophagitis: Secondary | ICD-10-CM | POA: Insufficient documentation

## 2014-05-06 DIAGNOSIS — J9600 Acute respiratory failure, unspecified whether with hypoxia or hypercapnia: Principal | ICD-10-CM | POA: Insufficient documentation

## 2014-05-06 DIAGNOSIS — G894 Chronic pain syndrome: Secondary | ICD-10-CM | POA: Diagnosis present

## 2014-05-06 DIAGNOSIS — J441 Chronic obstructive pulmonary disease with (acute) exacerbation: Secondary | ICD-10-CM | POA: Insufficient documentation

## 2014-05-06 DIAGNOSIS — J45901 Unspecified asthma with (acute) exacerbation: Secondary | ICD-10-CM

## 2014-05-06 DIAGNOSIS — J9601 Acute respiratory failure with hypoxia: Secondary | ICD-10-CM | POA: Diagnosis present

## 2014-05-06 DIAGNOSIS — F3289 Other specified depressive episodes: Secondary | ICD-10-CM | POA: Insufficient documentation

## 2014-05-06 DIAGNOSIS — J440 Chronic obstructive pulmonary disease with acute lower respiratory infection: Secondary | ICD-10-CM | POA: Diagnosis not present

## 2014-05-06 DIAGNOSIS — IMO0002 Reserved for concepts with insufficient information to code with codable children: Secondary | ICD-10-CM | POA: Insufficient documentation

## 2014-05-06 DIAGNOSIS — F17200 Nicotine dependence, unspecified, uncomplicated: Secondary | ICD-10-CM | POA: Insufficient documentation

## 2014-05-06 DIAGNOSIS — J383 Other diseases of vocal cords: Secondary | ICD-10-CM | POA: Diagnosis not present

## 2014-05-06 DIAGNOSIS — F411 Generalized anxiety disorder: Secondary | ICD-10-CM | POA: Diagnosis not present

## 2014-05-06 DIAGNOSIS — R739 Hyperglycemia, unspecified: Secondary | ICD-10-CM | POA: Diagnosis present

## 2014-05-06 DIAGNOSIS — Z981 Arthrodesis status: Secondary | ICD-10-CM | POA: Diagnosis not present

## 2014-05-06 DIAGNOSIS — R7309 Other abnormal glucose: Secondary | ICD-10-CM | POA: Diagnosis not present

## 2014-05-06 DIAGNOSIS — Z79899 Other long term (current) drug therapy: Secondary | ICD-10-CM | POA: Insufficient documentation

## 2014-05-06 DIAGNOSIS — R0602 Shortness of breath: Secondary | ICD-10-CM | POA: Diagnosis present

## 2014-05-06 DIAGNOSIS — F329 Major depressive disorder, single episode, unspecified: Secondary | ICD-10-CM | POA: Insufficient documentation

## 2014-05-06 DIAGNOSIS — J209 Acute bronchitis, unspecified: Secondary | ICD-10-CM | POA: Insufficient documentation

## 2014-05-06 HISTORY — DX: Cardiac murmur, unspecified: R01.1

## 2014-05-06 HISTORY — DX: Shortness of breath: R06.02

## 2014-05-06 LAB — BASIC METABOLIC PANEL
ANION GAP: 15 (ref 5–15)
BUN: 11 mg/dL (ref 6–23)
CO2: 21 mEq/L (ref 19–32)
Calcium: 9.7 mg/dL (ref 8.4–10.5)
Chloride: 104 mEq/L (ref 96–112)
Creatinine, Ser: 0.74 mg/dL (ref 0.50–1.10)
GFR calc Af Amer: >90 mL/min (ref >90)
Glucose, Bld: 111 mg/dL — ABNORMAL HIGH (ref 70–99)
POTASSIUM: 3.3 meq/L — AB (ref 3.7–5.3)
Sodium: 140 mEq/L (ref 137–147)

## 2014-05-06 LAB — CBC
HCT: 35.9 % — ABNORMAL LOW (ref 36.0–46.0)
Hemoglobin: 11.9 g/dL — ABNORMAL LOW (ref 12.0–15.0)
MCH: 25.3 pg — AB (ref 26.0–34.0)
MCHC: 33.1 g/dL (ref 30.0–36.0)
MCV: 76.4 fL — ABNORMAL LOW (ref 78.0–100.0)
Platelets: 352 10*3/uL (ref 150–400)
RBC: 4.7 MIL/uL (ref 3.87–5.11)
RDW: 15.9 % — ABNORMAL HIGH (ref 11.5–15.5)
WBC: 8.9 10*3/uL (ref 4.0–10.5)

## 2014-05-06 LAB — I-STAT ARTERIAL BLOOD GAS, ED
ACID-BASE DEFICIT: 4 mmol/L — AB (ref 0.0–2.0)
Bicarbonate: 19.4 mEq/L — ABNORMAL LOW (ref 20.0–24.0)
O2 SAT: 97 %
Patient temperature: 100
TCO2: 20 mmol/L (ref 0–100)
pCO2 arterial: 29.9 mmHg — ABNORMAL LOW (ref 35.0–45.0)
pH, Arterial: 7.423 (ref 7.350–7.450)
pO2, Arterial: 90 mmHg (ref 80.0–100.0)

## 2014-05-06 LAB — I-STAT TROPONIN, ED: TROPONIN I, POC: 0 ng/mL (ref 0.00–0.08)

## 2014-05-06 MED ORDER — ALBUTEROL SULFATE (2.5 MG/3ML) 0.083% IN NEBU
5.0000 mg | INHALATION_SOLUTION | Freq: Once | RESPIRATORY_TRACT | Status: AC
  Administered 2014-05-06: 5 mg via RESPIRATORY_TRACT
  Filled 2014-05-06: qty 6

## 2014-05-06 MED ORDER — ALBUTEROL (5 MG/ML) CONTINUOUS INHALATION SOLN
20.0000 mg | INHALATION_SOLUTION | RESPIRATORY_TRACT | Status: AC
  Administered 2014-05-06: 20 mg via RESPIRATORY_TRACT
  Filled 2014-05-06: qty 20

## 2014-05-06 MED ORDER — POTASSIUM CHLORIDE CRYS ER 20 MEQ PO TBCR
40.0000 meq | EXTENDED_RELEASE_TABLET | Freq: Once | ORAL | Status: AC
  Administered 2014-05-07: 40 meq via ORAL
  Filled 2014-05-06: qty 2

## 2014-05-06 MED ORDER — IPRATROPIUM BROMIDE 0.02 % IN SOLN
0.5000 mg | Freq: Once | RESPIRATORY_TRACT | Status: AC
  Administered 2014-05-06: 0.5 mg via RESPIRATORY_TRACT
  Filled 2014-05-06: qty 2.5

## 2014-05-06 MED ORDER — METHYLPREDNISOLONE SODIUM SUCC 125 MG IJ SOLR
125.0000 mg | Freq: Once | INTRAMUSCULAR | Status: AC
  Administered 2014-05-06: 125 mg via INTRAVENOUS
  Filled 2014-05-06: qty 2

## 2014-05-06 MED ORDER — LORAZEPAM 2 MG/ML IJ SOLN
1.0000 mg | Freq: Once | INTRAMUSCULAR | Status: AC
  Administered 2014-05-06: 1 mg via INTRAVENOUS
  Filled 2014-05-06: qty 1

## 2014-05-06 MED ORDER — MAGNESIUM SULFATE 40 MG/ML IJ SOLN
2.0000 g | Freq: Once | INTRAMUSCULAR | Status: AC
  Administered 2014-05-06: 2 g via INTRAVENOUS
  Filled 2014-05-06: qty 50

## 2014-05-06 MED ORDER — BENZOCAINE (TOPICAL) 20 % EX AERO
INHALATION_SPRAY | Freq: Four times a day (QID) | CUTANEOUS | Status: DC | PRN
  Filled 2014-05-06: qty 57

## 2014-05-06 MED ORDER — SODIUM CHLORIDE 0.9 % IV BOLUS (SEPSIS)
1000.0000 mL | Freq: Once | INTRAVENOUS | Status: AC
  Administered 2014-05-07: 1000 mL via INTRAVENOUS

## 2014-05-06 NOTE — ED Notes (Signed)
Pt is communicative.

## 2014-05-06 NOTE — ED Provider Notes (Signed)
CSN: [id number]     Arrival date & time 05/06/14  2031 History   First MD Initiated Contact with Patient 05/06/14 2106     Chief Complaint  Patient presents with  . Asthma  . Chest Pain     (Consider location/radiation/quality/duration/timing/severity/associated sxs/prior Treatment) HPI Comments: Patient is a 38 year old female past medical history significant for asthma, anxiety, COPD, tobacco abuse presenting to the emergency department for asthma exacerbation that began today. Patient endorses that she's had associated cough, chest tightness, wheezing, vocal cord spasm, fever. Patient states she's attempted to take her at home rescue inhalers without relief. Patient is a level V caveat d/t respiratory distress.   Patient is a 38 y.o. female presenting with asthma and chest pain.  Asthma Associated symptoms include chest pain.  Chest Pain   Past Medical History  Diagnosis Date  . Asthma   . Anxiety   . COPD (chronic obstructive pulmonary disease)   . Bronchitis    Past Surgical History  Procedure Laterality Date  . Spinal fusion     Family History  Problem Relation Age of Onset  . HIV Mother   . Heart disease Father   . CVA Father   . Heart disease Other   . Emphysema Maternal Grandmother     smoked  . Asthma Sister   . Clotting disorder Sister   . Clotting disorder Maternal Grandmother   . Lung cancer Maternal Grandmother     smoked   History  Substance Use Topics  . Smoking status: Current Every Day Smoker -- 2.00 packs/day for 17 years    Types: Cigarettes    Last Attempt to Quit: 08/16/2013  . Smokeless tobacco: Never Used  . Alcohol Use: Yes     Comment: occasional   OB History   Grav Para Term Preterm Abortions TAB SAB Ect Mult Living                 Review of Systems  Unable to perform ROS: Acuity of condition  Cardiovascular: Positive for chest pain.  All other systems reviewed and are negative.     Allergies  Robitussin dm; Rayon,  purified; Nsaids; and Tramadol  Home Medications   Prior to Admission medications   Medication Sig Start Date End Date Taking? Authorizing Provider  albuterol (PROVENTIL HFA;VENTOLIN HFA) 108 (90 BASE) MCG/ACT inhaler Inhale 2 puffs into the lungs every 4 (four) hours as needed for wheezing or shortness of breath.   Yes Historical Provider, MD  albuterol (PROVENTIL) (2.5 MG/3ML) 0.083% nebulizer solution Take 2.5 mg by nebulization every 6 (six) hours as needed for wheezing or shortness of breath.   Yes Historical Provider, MD  alprazolam Prudy Feeler) 2 MG tablet Take 2 mg by mouth every 6 (six) hours as needed for anxiety.   Yes Historical Provider, MD  mometasone-formoterol (DULERA) 100-5 MCG/ACT AERO Inhale 2 puffs into the lungs 2 (two) times daily.   Yes Historical Provider, MD   BP 117/56  Pulse 132  Temp(Src) 100 F (37.8 C) (Oral)  Resp 27  SpO2 100%  LMP 04/15/2014 Physical Exam  Constitutional: She is oriented to person, place, and time. She appears well-developed and well-nourished. No distress.  HENT:  Head: Normocephalic and atraumatic.  Right Ear: External ear normal.  Left Ear: External ear normal.  Mouth/Throat: Oropharynx is clear and moist.  Eyes: Conjunctivae are normal.  Neck: Neck supple.  Cardiovascular: Regular rhythm and normal heart sounds.  Tachycardia present.   Pulmonary/Chest: Accessory muscle usage present.  Tachypnea noted. She is in respiratory distress. She has no rales. She exhibits no tenderness.  Speaking in one-word sentences. Patient is using accessory muscles to breathe. She has both inspiratory and extra stridor. She has no definite wheezing but some mild decrease in breath sounds bilaterally lung fields    Abdominal: Soft. There is no tenderness.  Musculoskeletal: Normal range of motion.  Neurological: She is alert and oriented to person, place, and time.  Skin: Skin is warm and dry. She is not diaphoretic.    ED Course  Procedures (including  critical care time) Medications  albuterol (PROVENTIL,VENTOLIN) solution continuous neb (20 mg Nebulization New Bag/Given 05/06/14 2139)  magnesium sulfate IVPB 2 g 50 mL (2 g Intravenous New Bag/Given 05/06/14 2326)  sodium chloride 0.9 % bolus 1,000 mL (not administered)  benzocaine (HURRICAINE) 20 % oral spray (not administered)  potassium chloride SA (K-DUR,KLOR-CON) CR tablet 40 mEq (not administered)  albuterol (PROVENTIL) (2.5 MG/3ML) 0.083% nebulizer solution 5 mg (5 mg Nebulization Given 05/06/14 2045)  ipratropium (ATROVENT) nebulizer solution 0.5 mg (0.5 mg Nebulization Given 05/06/14 2045)  methylPREDNISolone sodium succinate (SOLU-MEDROL) 125 mg/2 mL injection 125 mg (125 mg Intravenous Given 05/06/14 2208)  LORazepam (ATIVAN) injection 1 mg (1 mg Intravenous Given 05/06/14 2207)    Labs Review Labs Reviewed  BASIC METABOLIC PANEL - Abnormal; Notable for the following:    Potassium 3.3 (*)    Glucose, Bld 111 (*)    All other components within normal limits  CBC - Abnormal; Notable for the following:    Hemoglobin 11.9 (*)    HCT 35.9 (*)    MCV 76.4 (*)    MCH 25.3 (*)    RDW 15.9 (*)    All other components within normal limits  I-STAT ARTERIAL BLOOD GAS, ED - Abnormal; Notable for the following:    pCO2 arterial 29.9 (*)    Bicarbonate 19.4 (*)    Acid-base deficit 4.0 (*)    All other components within normal limits  BLOOD GAS, ARTERIAL  PRO B NATRIURETIC PEPTIDE  I-STAT TROPOININ, ED    Imaging Review Dg Chest Portable 1 View  05/06/2014   CLINICAL DATA:  Chest pain, asthma  EXAM: PORTABLE CHEST - 1 VIEW  COMPARISON:  04/13/2014  FINDINGS: The heart size and mediastinal contours are within normal limits. Both lungs are clear. The visualized skeletal structures are unremarkable.  IMPRESSION: No active disease.   Electronically Signed   By: Elige Ko   On: 05/06/2014 21:46     EKG Interpretation   Date/Time:  Tuesday May 06 2014 20:42:45 EDT Ventricular  Rate:  107 PR Interval:  174 QRS Duration: 78 QT Interval:  330 QTC Calculation: 440 R Axis:   63 Text Interpretation:  Sinus tachycardia Otherwise normal ECG Confirmed by  DELOS  MD, DOUGLAS ([id number]) on 05/06/2014 11:07:10 PM      12:02 AM Discussed patient with Dr. Toniann Fail who will admit the patient to stepdown  CRITICAL CARE Performed by: Francee Piccolo L   Total critical care time: 35 minutes  Critical care time was exclusive of separately billable procedures and treating other patients.  Critical care was necessary to treat or prevent imminent or life-threatening deterioration.  Critical care was time spent personally by me on the following activities: development of treatment plan with patient and/or surrogate as well as nursing, discussions with consultants, evaluation of patient's response to treatment, examination of patient, obtaining history from patient or surrogate, ordering and performing treatments and interventions,  ordering and review of laboratory studies, ordering and review of radiographic studies, pulse oximetry and re-evaluation of patient's condition.  MDM   Final diagnoses:  Acute respiratory failure, unspecified whether with hypoxia or hypercapnia    Filed Vitals:   05/06/14 2345  BP: 122/60  Pulse: 119  Temp:   Resp: 26    I have reviewed nursing notes, vital signs, and all appropriate lab and imaging results for this patient.   Patient presenting in acute respiratory distress, multiple visits to the emergency department with similar complaints secondary to asthma, COPD, vocal cord spasm. Patient treated with a continuous neb, magnesium, Solu-Medrol with some improvement of symptoms. Stridor, accessory muscle use improved. Patient was also given low-dose Ativan and observed very closely. She continued to have no improvement though her saturations remained 100% on room air. Patient given IV Magnesium, nebulizer treatments held due to  tachycardia. Patient will be admitted to stepdown for further evaluation of respiratory distress. Patient d/w with Dr. Judd Lien, agrees with plan.       Jeannetta Ellis, PA-C 05/07/14 [phone]

## 2014-05-06 NOTE — ED Notes (Signed)
Patient finished hour long neb treatment and still in respiratory distress. EDP summoned to bedside.

## 2014-05-06 NOTE — ED Notes (Signed)
Pt having chest pain and vocal chord spasming hx. Intubation x4 in past but states it is because she waits several days. Is here trying to catch it early. Pt is talking and sating 100%. Dry hacking cough.

## 2014-05-07 ENCOUNTER — Encounter (HOSPITAL_COMMUNITY): Payer: Self-pay | Admitting: Internal Medicine

## 2014-05-07 DIAGNOSIS — R739 Hyperglycemia, unspecified: Secondary | ICD-10-CM | POA: Diagnosis present

## 2014-05-07 DIAGNOSIS — J441 Chronic obstructive pulmonary disease with (acute) exacerbation: Secondary | ICD-10-CM | POA: Diagnosis not present

## 2014-05-07 DIAGNOSIS — G894 Chronic pain syndrome: Secondary | ICD-10-CM

## 2014-05-07 DIAGNOSIS — F17200 Nicotine dependence, unspecified, uncomplicated: Secondary | ICD-10-CM | POA: Diagnosis not present

## 2014-05-07 DIAGNOSIS — F411 Generalized anxiety disorder: Secondary | ICD-10-CM

## 2014-05-07 DIAGNOSIS — J9600 Acute respiratory failure, unspecified whether with hypoxia or hypercapnia: Secondary | ICD-10-CM | POA: Diagnosis not present

## 2014-05-07 DIAGNOSIS — R7309 Other abnormal glucose: Secondary | ICD-10-CM

## 2014-05-07 DIAGNOSIS — J209 Acute bronchitis, unspecified: Secondary | ICD-10-CM

## 2014-05-07 DIAGNOSIS — J440 Chronic obstructive pulmonary disease with acute lower respiratory infection: Secondary | ICD-10-CM | POA: Diagnosis not present

## 2014-05-07 DIAGNOSIS — R0602 Shortness of breath: Secondary | ICD-10-CM | POA: Diagnosis present

## 2014-05-07 LAB — GLUCOSE, CAPILLARY: Glucose-Capillary: 236 mg/dL — ABNORMAL HIGH (ref 70–99)

## 2014-05-07 LAB — BASIC METABOLIC PANEL
ANION GAP: 20 — AB (ref 5–15)
BUN: 11 mg/dL (ref 6–23)
CO2: 13 meq/L — AB (ref 19–32)
CREATININE: 0.7 mg/dL (ref 0.50–1.10)
Calcium: 8.8 mg/dL (ref 8.4–10.5)
Chloride: 102 mEq/L (ref 96–112)
GFR calc non Af Amer: >90 mL/min (ref >90)
GLUCOSE: 273 mg/dL — AB (ref 70–99)
Potassium: 3.8 mEq/L (ref 3.7–5.3)
Sodium: 135 mEq/L — ABNORMAL LOW (ref 137–147)

## 2014-05-07 LAB — CBC
HCT: 32.5 % — ABNORMAL LOW (ref 36.0–46.0)
HEMOGLOBIN: 10.3 g/dL — AB (ref 12.0–15.0)
MCH: 25 pg — ABNORMAL LOW (ref 26.0–34.0)
MCHC: 31.7 g/dL (ref 30.0–36.0)
MCV: 78.9 fL (ref 78.0–100.0)
PLATELETS: 301 10*3/uL (ref 150–400)
RBC: 4.12 MIL/uL (ref 3.87–5.11)
RDW: 16.5 % — ABNORMAL HIGH (ref 11.5–15.5)
WBC: 13.7 10*3/uL — AB (ref 4.0–10.5)

## 2014-05-07 LAB — HEMOGLOBIN A1C
HEMOGLOBIN A1C: 5.4 % (ref <5.7)
MEAN PLASMA GLUCOSE: 108 mg/dL (ref <117)

## 2014-05-07 LAB — RAPID URINE DRUG SCREEN, HOSP PERFORMED
Amphetamines: NOT DETECTED
BARBITURATES: NOT DETECTED
Benzodiazepines: NOT DETECTED
Cocaine: NOT DETECTED
Opiates: NOT DETECTED
TETRAHYDROCANNABINOL: NOT DETECTED

## 2014-05-07 LAB — PRO B NATRIURETIC PEPTIDE: Pro B Natriuretic peptide (BNP): <5 pg/mL (ref 0–125)

## 2014-05-07 LAB — PREGNANCY, URINE: Preg Test, Ur: NEGATIVE

## 2014-05-07 MED ORDER — SODIUM CHLORIDE 0.9 % IJ SOLN
3.0000 mL | Freq: Two times a day (BID) | INTRAMUSCULAR | Status: DC
  Administered 2014-05-07: 3 mL via INTRAVENOUS

## 2014-05-07 MED ORDER — ONDANSETRON HCL 4 MG/2ML IJ SOLN: 4.0000 mg | Freq: Four times a day (QID) | INTRAMUSCULAR | Status: DC | PRN

## 2014-05-07 MED ORDER — INFLUENZA VAC SPLIT QUAD 0.5 ML IM SUSY: 0.5000 mL | PREFILLED_SYRINGE | INTRAMUSCULAR | Status: DC

## 2014-05-07 MED ORDER — ALBUTEROL SULFATE (2.5 MG/3ML) 0.083% IN NEBU
2.5000 mg | INHALATION_SOLUTION | RESPIRATORY_TRACT | Status: DC
  Administered 2014-05-07 (×2): 2.5 mg via RESPIRATORY_TRACT
  Filled 2014-05-07 (×3): qty 3

## 2014-05-07 MED ORDER — METHYLPREDNISOLONE SODIUM SUCC 40 MG IJ SOLR
40.0000 mg | Freq: Every day | INTRAMUSCULAR | Status: DC
  Administered 2014-05-07: 40 mg via INTRAVENOUS
  Filled 2014-05-07: qty 1

## 2014-05-07 MED ORDER — ALBUTEROL SULFATE (2.5 MG/3ML) 0.083% IN NEBU: 2.5000 mg | INHALATION_SOLUTION | RESPIRATORY_TRACT | Status: DC | PRN

## 2014-05-07 MED ORDER — ACETAMINOPHEN 650 MG RE SUPP: 650.0000 mg | Freq: Four times a day (QID) | RECTAL | Status: DC | PRN

## 2014-05-07 MED ORDER — PANTOPRAZOLE SODIUM 40 MG PO TBEC: 40.0000 mg | DELAYED_RELEASE_TABLET | Freq: Every day | ORAL | Status: DC

## 2014-05-07 MED ORDER — PREDNISONE 10 MG PO TABS: ORAL_TABLET | ORAL | Status: DC

## 2014-05-07 MED ORDER — ALPRAZOLAM 2 MG PO TABS: 2.0000 mg | ORAL_TABLET | Freq: Three times a day (TID) | ORAL | Status: DC | PRN

## 2014-05-07 MED ORDER — BUDESONIDE 0.25 MG/2ML IN SUSP
0.2500 mg | Freq: Two times a day (BID) | RESPIRATORY_TRACT | Status: DC
  Administered 2014-05-07: 0.25 mg via RESPIRATORY_TRACT
  Filled 2014-05-07 (×4): qty 2

## 2014-05-07 MED ORDER — OXYCODONE HCL 5 MG PO TABS
20.0000 mg | ORAL_TABLET | ORAL | Status: DC | PRN
  Administered 2014-05-07 (×3): 20 mg via ORAL
  Filled 2014-05-07 (×3): qty 4

## 2014-05-07 MED ORDER — ALPRAZOLAM 0.5 MG PO TABS
2.0000 mg | ORAL_TABLET | Freq: Four times a day (QID) | ORAL | Status: DC | PRN
  Administered 2014-05-07 (×2): 2 mg via ORAL
  Filled 2014-05-07 (×2): qty 4

## 2014-05-07 MED ORDER — ONDANSETRON HCL 4 MG PO TABS: 4.0000 mg | ORAL_TABLET | Freq: Four times a day (QID) | ORAL | Status: DC | PRN

## 2014-05-07 MED ORDER — ENOXAPARIN SODIUM 40 MG/0.4ML ~~LOC~~ SOLN
40.0000 mg | SUBCUTANEOUS | Status: DC
  Administered 2014-05-07: 40 mg via SUBCUTANEOUS
  Filled 2014-05-07 (×2): qty 0.4

## 2014-05-07 MED ORDER — ACETAMINOPHEN 325 MG PO TABS: 650.0000 mg | ORAL_TABLET | Freq: Four times a day (QID) | ORAL | Status: DC | PRN

## 2014-05-07 MED ORDER — OXYCODONE HCL 20 MG PO TABS: 10.0000 mg | ORAL_TABLET | Freq: Four times a day (QID) | ORAL | Status: DC | PRN

## 2014-05-07 NOTE — Progress Notes (Signed)
UR completed 

## 2014-05-07 NOTE — ED Notes (Addendum)
Patient is breathing without any difficulty at this time. Patient keeps grunting when listening to her lungs but does not do it any other time.

## 2014-05-07 NOTE — H&P (Signed)
[facility] History and Physical  Julie Kerr WUJ:[id number] DOB: 1976/08/03 DOA: 05/06/2014  Referring physician: ER physician. PCP: Lu Duffel, MD   Chief Complaint: Shortness of breath.  HPI: Julie Kerr is a 38 y.o. female with history of vocal cord dysfunction/asthma presents to the ER with sudden onset of shortness of breath since last night. In the ER patient was found to be wheezing and patient also states that she has been having some productive cough. Denies any fever chills or chest pain. Had some nausea and one episode of vomiting but denies any abdominal pain or diarrhea. Chest x-ray does not show anything acute. In the ER patient was found to be wheezing and admitted for further observation.   Review of Systems: As presented in the history of presenting illness, rest negative.  Past Medical History  Diagnosis Date  . Asthma   . Anxiety   . COPD (chronic obstructive pulmonary disease)   . Bronchitis    Past Surgical History  Procedure Laterality Date  . Spinal fusion     Social History:  reports that she has been smoking Cigarettes.  She has a 34 pack-year smoking history. She has never used smokeless tobacco. She reports that she drinks alcohol. She reports that she does not use illicit drugs. Where does patient live home. Can patient participate in ADLs? Yes.  Allergies  Allergen Reactions  . Robitussin Dm [Dextromethorphan-Guaifenesin] Nausea And Vomiting  . Rayon, Purified Hives  . Nsaids Hives  . Tramadol Hives    Family History:  Family History  Problem Relation Age of Onset  . HIV Mother   . Heart disease Father   . CVA Father   . Heart disease Other   . Emphysema Maternal Grandmother     smoked  . Asthma Sister   . Clotting disorder Sister   . Clotting disorder Maternal Grandmother   . Lung cancer Maternal Grandmother     smoked      Prior to Admission medications   Medication Sig Start Date End Date Taking? Authorizing Provider   albuterol (PROVENTIL HFA;VENTOLIN HFA) 108 (90 BASE) MCG/ACT inhaler Inhale 2 puffs into the lungs every 4 (four) hours as needed for wheezing or shortness of breath.   Yes Historical Provider, MD  albuterol (PROVENTIL) (2.5 MG/3ML) 0.083% nebulizer solution Take 2.5 mg by nebulization every 6 (six) hours as needed for wheezing or shortness of breath.   Yes Historical Provider, MD  alprazolam Prudy Feeler) 2 MG tablet Take 2 mg by mouth every 6 (six) hours as needed for anxiety.   Yes Historical Provider, MD  mometasone-formoterol (DULERA) 100-5 MCG/ACT AERO Inhale 2 puffs into the lungs 2 (two) times daily.   Yes Historical Provider, MD    Physical Exam: Filed Vitals:   05/06/14 2345 05/07/14 0000 05/07/14 0030 05/07/14 0045  BP: 122/60 108/65 99/61 103/41  Pulse: 119 119 116 114  Temp:      TempSrc:      Resp: 36  SpO2: 100% 100% 99% 99%     General:  Well-developed and nourished.  Eyes: Anicteric no pallor.  ENT: No discharge from ears eyes nose mouth.  Neck: No stridor.  Cardiovascular: S1-S2 heard.  Respiratory: Bilateral expiratory wheeze heard no crepitations.  Abdomen: Soft nontender bowel sounds present. No guarding or rigidity.  Skin: No rash.  Musculoskeletal: No edema.  Psychiatric: Appears normal.  Neurologic: Alert awake oriented to time place and person. Moves all extremities.  Labs on Admission:  Basic Metabolic Panel:  Recent Labs Lab 05/06/14 2140  NA 140  K 3.3*  CL 104  CO2 21  GLUCOSE 111*  BUN 11  CREATININE 0.74  CALCIUM 9.7   Liver Function Tests: No results found for this basename: AST, ALT, ALKPHOS, BILITOT, PROT, ALBUMIN,  in the last 168 hours No results found for this basename: LIPASE, AMYLASE,  in the last 168 hours No results found for this basename: AMMONIA,  in the last 168 hours CBC:  Recent Labs Lab 05/06/14 2140  WBC 8.9  HGB 11.9*  HCT 35.9*  MCV 76.4*  PLT 352   Cardiac Enzymes: No results found for this  basename: CKTOTAL, CKMB, CKMBINDEX, TROPONINI,  in the last 168 hours  BNP (last 3 results)  Recent Labs  11/26/13 0500 03/07/14 2355 05/06/14 2328  PROBNP 10.4 <5.0 <5.0   CBG: No results found for this basename: GLUCAP,  in the last 168 hours  Radiological Exams on Admission: Dg Chest Portable 1 View  05/06/2014   CLINICAL DATA:  Chest pain, asthma  EXAM: PORTABLE CHEST - 1 VIEW  COMPARISON:  04/13/2014  FINDINGS: The heart size and mediastinal contours are within normal limits. Both lungs are clear. The visualized skeletal structures are unremarkable.  IMPRESSION: No active disease.   Electronically Signed   By: Elige Ko   On: 05/06/2014 21:46      Assessment/Plan Principal Problem:   Acute respiratory failure Active Problems:   Chronic pain syndrome   Anemia   Vocal cord dysfunction   Hyperglycemia   Shortness of breath   1. Shortness of breath most likely from - either a bronchitis or possible vocal cord dysfunction. Patient has been placed on Solu-Medrol nebulizers and Pulmicort. Since patient gets sudden severe attacks of shortness of breath patient will be monitored in telemetry overnight. 2. Hyperglycemia - probably from steroids. Check hemoglobin A1c. 3. Chronic pain - patient usually follows up with pain clinic. For now place patient on when necessary oxycodone. 4. Anemia - follow CBC. 5. History of anxiety disorder - on Xanax.    Code Status: Full code.  Family Communication: None.  Disposition Plan: Admit for observation.    Julie Kerr N. [facility] Pager [phone].  If 7PM-7AM, please contact night-coverage [email or web address] Password [email or web address] 05/07/2014, 1:21 AM

## 2014-05-07 NOTE — Progress Notes (Signed)
Inpatient Diabetes Program Recommendations  AACE/ADA: New Consensus Statement on Inpatient Glycemic Control (2013)  Target Ranges:  Prepandial:   less than 140 mg/dL      Peak postprandial:   less than 180 mg/dL (1-2 hours)      Critically ill patients:  140 - 180 mg/dL     Results for Julie Kerr, Julie Kerr (MRN [id number]) as of 05/07/2014 12:47  Ref. Range 05/07/2014 05:35  Glucose Latest Range: 70-99 mg/dL 865 (H)    Results for Julie Kerr, Julie Kerr (MRN [id number]) as of 05/07/2014 12:47  Ref. Range 05/07/2014 05:35  Hemoglobin A1C Latest Range: <5.7 % 5.4     Patient getting IV steroids- Solumedrol 40 mg daily.  A1c OK.    MD- Please place order for CBG checks and cover with Novolog Sensitive SSI if CBGs are elevated while on steroids    Will follow Ambrose Finland RN, MSN, CDE Diabetes Coordinator Inpatient Diabetes Program Team Pager: [phone] (8a-10p)

## 2014-05-07 NOTE — Progress Notes (Signed)
Glendon Axe, CSW met with patient this afternoon prior to d/c per Dr. Dyann Kief to provide outpatient psychiatric services. Patient asked to follow up with one of these agencies for support and counseling. Patient verbalized that she would do this. She has transportation. DC today per Dr. Dyann Kief. No further dc needs identified. CSW signing off.  Lorie Phenix. Pauline Good, [place]

## 2014-05-07 NOTE — ED Notes (Signed)
Report called to unit 3E.

## 2014-05-07 NOTE — ED Provider Notes (Signed)
Medical screening examination/treatment/procedure(s) were conducted as a shared visit with non-physician practitioner(s) and myself.  I personally evaluated the patient during the encounter. A stent is a 38 year old female with long-standing history of asthma who has required intubation and hospitalization multiple times in the past. She presents today with wheezing and difficulty breathing. She denies any fevers or chills. She denies any productive cough.  On exam, vitals are stable and the patient is afebrile. Oxygen saturations are 100%, however she is somewhat tachypneic with a respiratory rate of 27. Head is atraumatic, normocephalic. Neck is supple. Heart is tachycardic but regular. There are no murmurs. Lungs are noted to have expiratory wheezes bilaterally.  Chest x-ray reveals no acute process and the patient was given albuterol nebs and hour-long. She was given steroids and magnesium, however continued to wheeze and be in respiratory distress. Internal medicine has been consult with the patient will be admitted.   EKG Interpretation   Date/Time:  Tuesday May 06 2014 20:42:45 EDT Ventricular Rate:  107 PR Interval:  174 QRS Duration: 78 QT Interval:  330 QTC Calculation: 440 R Axis:   63 Text Interpretation:  Sinus tachycardia Otherwise normal ECG Confirmed by  DELOS  MD, Izaac Reisig ([id number]) on 05/06/2014 11:07:10 PM     CRITICAL CARE Performed by: Geoffery Lyons Total critical care time: 30 minutes Critical care time was exclusive of separately billable procedures and treating other patients. Critical care was necessary to treat or prevent imminent or life-threatening deterioration. Critical care was time spent personally by me on the following activities: development of treatment plan with patient and/or surrogate as well as nursing, discussions with consultants, evaluation of patient's response to treatment, examination of patient, obtaining history from patient or surrogate,  ordering and performing treatments and interventions, ordering and review of laboratory studies, ordering and review of radiographic studies, pulse oximetry and re-evaluation of patient's condition.   Geoffery Lyons, MD 05/07/14 [phone]

## 2014-05-07 NOTE — ED Notes (Signed)
Attempted to call report

## 2014-05-07 NOTE — Discharge Summary (Signed)
Physician Discharge Summary  Julie Kerr ZOX:[id number] DOB: Jan 24, 1976 DOA: 05/06/2014  PCP: Lu Duffel, MD  Admit date: 05/06/2014 Discharge date: 05/07/2014  Time spent: >30 minutes  Recommendations for Outpatient Follow-up:  Close follow up to patient CBG's and periodic checks of A1C; at risk of developing diabetes due to steroids use Assistance with tobacco cessation Make sure patient has follow/established care with psychiatry  Discharge Diagnoses:  Principal Problem:   Acute respiratory failure Active Problems:   Chronic pain syndrome   Anemia   Vocal cord dysfunction   Hyperglycemia   Shortness of breath   Discharge Condition: stable and improved. Discharge home with instructions to follow with pain clinic on 9/25; follow up with PCP in 1 week and with pulmonologist in 2 weeks. Also encourage to set up appointment with psychiatry service following instructions and resources provided by Child psychotherapist.  Diet recommendation: low carbohydrates diet  Filed Weights   05/07/14 0238  Weight: 82.2 kg (181 lb 3.5 oz)    History of present illness:  38 y.o. female with history of vocal cord dysfunction/asthma presents to the ER with sudden onset of shortness of breath since last night. In the ER patient was found to be wheezing and patient also states that she has been having some productive cough. Denies any fever chills or chest pain. Had some nausea and one episode of vomiting but denies any abdominal pain or diarrhea. Chest x-ray does not show anything acute. In the ER patient was found to be wheezing and admitted for further observation.    Hospital Course:  1-SOB: due to mild asthma/COPD exacerbation. -improved with nebulizer treatment and solumedrol given in the ED -good O2 sat on RA and no further wheezing or SOB -patient discharge with short tapering prednisone course and instructions to continue using inhalers regimen -advise to quit smoking and to follow with  pulmonologist in 2 weeks  2-anxiety/depression: patient ran out of her xanax. -will provide short amount prescription to last until follow up with PCP -needs psychiatry evaluation and medication adjustments in outpatient -currently not receiving anything for depression -resources for outpatient psychiatry provided by social worker  3-GERD: started on protonix daily  4-chronic pain: will follow with pain clinic on 9/25 -small amount of pain medications prescribed to treat pain and avoid withdrawal until follow up on the 25th with pain clinic  5-hyperglycemia: w/o hx of diabetes -most likely due to steroids -A1C 5.6 -advised to follow low carb diet  6-tobacco abuse: cessation counseling provided. Patient advised to start using nicotine patch and to call 1-800-Quit-Now line for assistance and support   Procedures: See below for x-ray reports   Consultations:  None   Discharge Exam: Filed Vitals:   05/07/14 1404  BP: 114/52  Pulse: 85  Temp: 98.6 F (37 C)  Resp: 20    General: no further CP, breathing comfortable and complaining just of chronic back pain and mild anxiety Cardiovascular: S1 and S2, no rubs or gallops Respiratory: good air movement, no wheezing, no rales or crackles Abd: soft, NT, ND, positive BS Neuro: non focal.  Discharge Instructions You were cared for by a hospitalist during your hospital stay. If you have any questions about your discharge medications or the care you received while you were in the hospital after you are discharged, you can call the unit and asked to speak with the hospitalist on call if the hospitalist that took care of you is not available. Once you are discharged, your primary care physician will handle  any further medical issues. Please note that NO REFILLS for any discharge medications will be authorized once you are discharged, as it is imperative that you return to your primary care physician (or establish a relationship with a  primary care physician if you do not have one) for your aftercare needs so that they can reassess your need for medications and monitor your lab values.  Discharge Instructions   Discharge instructions    Complete by:  As directed   Follow resources for follow up with psychiatry service Arrange follow up with PCP in 10 days Follow up with pulmonary service in 2 weeks Follow up with pain clinic as previously scheduled          Current Discharge Medication List    START taking these medications   Details  oxyCODONE 20 MG TABS Take 0.5-0.75 tablets (10-15 mg total) by mouth every 6 (six) hours as needed for severe pain. Qty: 20 tablet, Refills: 0    pantoprazole (PROTONIX) 40 MG tablet Take 1 tablet (40 mg total) by mouth daily. Qty: 30 tablet, Refills: 1    predniSONE (DELTASONE) 10 MG tablet Take 6 tablets by mouth daily X 2 days; then 4 tablets by mouth daily X 2; then 2 tablets by mouth daily X 2 days; then 1 tablet by mouth daily X 2 days and stop prednisone. Qty: 26 tablet, Refills: 0      CONTINUE these medications which have CHANGED   Details  alprazolam (XANAX) 2 MG tablet Take 1 tablet (2 mg total) by mouth 3 (three) times daily as needed for anxiety. Qty: 20 tablet, Refills: 0      CONTINUE these medications which have NOT CHANGED   Details  albuterol (PROVENTIL HFA;VENTOLIN HFA) 108 (90 BASE) MCG/ACT inhaler Inhale 2 puffs into the lungs every 4 (four) hours as needed for wheezing or shortness of breath.    albuterol (PROVENTIL) (2.5 MG/3ML) 0.083% nebulizer solution Take 2.5 mg by nebulization every 6 (six) hours as needed for wheezing or shortness of breath.    mometasone-formoterol (DULERA) 100-5 MCG/ACT AERO Inhale 2 puffs into the lungs 2 (two) times daily.       Allergies  Allergen Reactions  . Robitussin Dm [Dextromethorphan-Guaifenesin] Nausea And Vomiting  . Rayon, Purified Hives  . Nsaids Hives  . Tramadol Hives   Follow-up Information   Follow up  with WELLS,WENDELL, MD. Schedule an appointment as soon as possible for a visit in 10 days.   Specialty:  Family Medicine      Follow up with Sandrea Hughs, MD In 2 weeks.   Specialty:  Pulmonary Disease   Contact information:   [address] [place] [address] [phone]       The results of significant diagnostics from this hospitalization (including imaging, microbiology, ancillary and laboratory) are listed below for reference.    Significant Diagnostic Studies: Dg Chest Portable 1 View  05/06/2014   CLINICAL DATA:  Chest pain, asthma  EXAM: PORTABLE CHEST - 1 VIEW  COMPARISON:  04/13/2014  FINDINGS: The heart size and mediastinal contours are within normal limits. Both lungs are clear. The visualized skeletal structures are unremarkable.  IMPRESSION: No active disease.   Electronically Signed   By: Elige Ko   On: 05/06/2014 21:46   Dg Chest Port 1 View  04/13/2014   CLINICAL DATA:  38 year old female with shortness of breath.  EXAM: PORTABLE CHEST - 1 VIEW  COMPARISON:  03/08/2014 and multiple prior chest radiographs.  FINDINGS:  The cardiomediastinal silhouette is unremarkable.  Minimal subsegmental right basilar atelectasis noted.  Mild peribronchial thickening is unchanged.  There is no evidence of focal airspace disease, pulmonary edema, suspicious pulmonary nodule/mass, pleural effusion, or pneumothorax. No acute bony abnormalities are identified.  IMPRESSION: Minimal right basilar subsegmental atelectasis and mild chronic peribronchial thickening.   Electronically Signed   By: Laveda Abbe M.D.   On: 04/13/2014 15:55   Labs: Basic Metabolic Panel:  Recent Labs Lab 05/06/14 2140 05/07/14 0535  NA 140 135*  K 3.3* 3.8  CL 104 102  CO2 21 13*  GLUCOSE 111* 273*  BUN 11 11  CREATININE 0.74 0.70  CALCIUM 9.7 8.8   CBC:  Recent Labs Lab 05/06/14 2140 05/07/14 0535  WBC 8.9 13.7*  HGB 11.9* 10.3*  HCT 35.9* 32.5*  MCV 76.4* 78.9  PLT 352 301    BNP (last 3  results)  Recent Labs  11/26/13 0500 03/07/14 2355 05/06/14 2328  PROBNP 10.4 <5.0 <5.0   CBG:  Recent Labs Lab 05/07/14 1127  GLUCAP 236*    Signed:  Vassie Loll  [facility] 05/07/2014, 5:09 PM

## 2014-05-09 ENCOUNTER — Ambulatory Visit (HOSPITAL_BASED_OUTPATIENT_CLINIC_OR_DEPARTMENT_OTHER): Payer: Medicaid Other | Admitting: Physical Medicine & Rehabilitation

## 2014-05-09 ENCOUNTER — Encounter: Payer: Medicaid Other | Attending: Physical Medicine & Rehabilitation

## 2014-05-09 ENCOUNTER — Encounter: Payer: Self-pay | Admitting: Physical Medicine & Rehabilitation

## 2014-05-09 VITALS — BP 116/62 | HR 82 | Resp 14 | Wt 183.6 lb

## 2014-05-09 DIAGNOSIS — M545 Low back pain, unspecified: Secondary | ICD-10-CM

## 2014-05-09 DIAGNOSIS — Z5181 Encounter for therapeutic drug level monitoring: Secondary | ICD-10-CM | POA: Insufficient documentation

## 2014-05-09 DIAGNOSIS — G8929 Other chronic pain: Secondary | ICD-10-CM

## 2014-05-09 DIAGNOSIS — Z79899 Other long term (current) drug therapy: Secondary | ICD-10-CM | POA: Insufficient documentation

## 2014-05-09 DIAGNOSIS — M961 Postlaminectomy syndrome, not elsewhere classified: Secondary | ICD-10-CM

## 2014-05-09 NOTE — Patient Instructions (Signed)
Physical therapy will call to set up appointment  No narcotic analgesics until urine drug screen has been reviewed
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
## 2014-05-14 ENCOUNTER — Telehealth: Payer: Self-pay | Admitting: *Deleted

## 2014-05-14 NOTE — Telephone Encounter (Addendum)
Has been calling for the results of her UDS to get pain medication.  I explained to her that Dr Wynn BankerKirsteins is out of the office until Monday October 5.  Her UDS shows that she had both xanax and oxycodone.  She has had rx for both. This was not consistent with what she reported. She said no meds had been taken though she did report that she had had oxycodone previously.  I checked NCCSR and she got #120 04/14/14 and #20 05/08/14  So she should have enough to last until Dr Wynn BankerKirsteins back in office.

## 2014-05-19 ENCOUNTER — Telehealth: Payer: Self-pay | Admitting: *Deleted

## 2014-05-19 ENCOUNTER — Other Ambulatory Visit: Payer: Self-pay | Admitting: *Deleted

## 2014-05-19 DIAGNOSIS — M545 Low back pain, unspecified: Secondary | ICD-10-CM

## 2014-05-19 MED ORDER — OXYCODONE HCL 15 MG PO TABS: 15.0000 mg | ORAL_TABLET | Freq: Three times a day (TID) | ORAL | Status: DC | PRN

## 2014-05-19 NOTE — Telephone Encounter (Signed)
OK to refill patient oxycodone as per Dr. Wynn BankerKirsteins.  Printed out and having Doc sign.  Called patient to inform it is waiting for her to pick up

## 2014-05-19 NOTE — Telephone Encounter (Signed)
Patient called again about her pain meds.  Forward results from Drug Screen to Dr. Wynn BankerKirsteins and see if he will OK her RX for Oxycodone   Previous note:  Has been calling for the results of her UDS to get pain medication. I explained to her that Dr Wynn BankerKirsteins is out of the office until Monday October 5. Her UDS shows that she had both xanax and oxycodone. She has had rx for both. This was not consistent with what she reported. She said no meds had been taken though she did report that she had had oxycodone previously. I checked NCCSR and she got #120 04/14/14 and #20 05/08/14 So she should have enough to last until Dr Wynn BankerKirsteins back in office.

## 2014-05-19 NOTE — Telephone Encounter (Signed)
Oxycodone 15 mg 3 times per day #90 no refills  Patient may pick up  Followup appointment with Riley LamEunice in 2 weeks for pill count and u.d.s

## 2014-05-31 ENCOUNTER — Encounter (HOSPITAL_COMMUNITY): Payer: Self-pay | Admitting: Emergency Medicine

## 2014-05-31 ENCOUNTER — Inpatient Hospital Stay (HOSPITAL_COMMUNITY)
Admission: EM | Admit: 2014-05-31 | Discharge: 2014-06-01 | DRG: 202 | Disposition: A | Discharge status: Home / Self Care | Payer: Medicaid Other | Attending: Internal Medicine | Admitting: Internal Medicine

## 2014-05-31 ENCOUNTER — Emergency Department (HOSPITAL_COMMUNITY): Payer: Medicaid Other

## 2014-05-31 DIAGNOSIS — J45901 Unspecified asthma with (acute) exacerbation: Principal | ICD-10-CM | POA: Diagnosis present

## 2014-05-31 DIAGNOSIS — R55 Syncope and collapse: Secondary | ICD-10-CM

## 2014-05-31 DIAGNOSIS — R0602 Shortness of breath: Secondary | ICD-10-CM | POA: Diagnosis present

## 2014-05-31 DIAGNOSIS — J9621 Acute and chronic respiratory failure with hypoxia: Secondary | ICD-10-CM | POA: Diagnosis present

## 2014-05-31 DIAGNOSIS — J9600 Acute respiratory failure, unspecified whether with hypoxia or hypercapnia: Secondary | ICD-10-CM

## 2014-05-31 DIAGNOSIS — F419 Anxiety disorder, unspecified: Secondary | ICD-10-CM | POA: Diagnosis present

## 2014-05-31 DIAGNOSIS — R0600 Dyspnea, unspecified: Secondary | ICD-10-CM | POA: Diagnosis present

## 2014-05-31 DIAGNOSIS — F17210 Nicotine dependence, cigarettes, uncomplicated: Secondary | ICD-10-CM | POA: Diagnosis present

## 2014-05-31 DIAGNOSIS — R0789 Other chest pain: Secondary | ICD-10-CM

## 2014-05-31 DIAGNOSIS — J449 Chronic obstructive pulmonary disease, unspecified: Secondary | ICD-10-CM | POA: Diagnosis present

## 2014-05-31 DIAGNOSIS — Z79899 Other long term (current) drug therapy: Secondary | ICD-10-CM

## 2014-05-31 LAB — CBC WITH DIFFERENTIAL/PLATELET
BASOS ABS: 0.1 10*3/uL (ref 0.0–0.1)
Basophils Relative: 1 % (ref 0–1)
Eosinophils Absolute: 0.3 10*3/uL (ref 0.0–0.7)
Eosinophils Relative: 3 % (ref 0–5)
HEMATOCRIT: 36 % (ref 36.0–46.0)
Hemoglobin: 12 g/dL (ref 12.0–15.0)
Lymphocytes Relative: 32 % (ref 12–46)
Lymphs Abs: 2.9 10*3/uL (ref 0.7–4.0)
MCH: 25.6 pg — AB (ref 26.0–34.0)
MCHC: 33.3 g/dL (ref 30.0–36.0)
MCV: 76.9 fL — ABNORMAL LOW (ref 78.0–100.0)
Monocytes Absolute: 0.6 10*3/uL (ref 0.1–1.0)
Monocytes Relative: 7 % (ref 3–12)
NEUTROS ABS: 5.3 10*3/uL (ref 1.7–7.7)
Neutrophils Relative %: 59 % (ref 43–77)
Platelets: 356 10*3/uL (ref 150–400)
RBC: 4.68 MIL/uL (ref 3.87–5.11)
RDW: 15.6 % — ABNORMAL HIGH (ref 11.5–15.5)
WBC: 9.1 10*3/uL (ref 4.0–10.5)

## 2014-05-31 LAB — COMPREHENSIVE METABOLIC PANEL
ALT: 11 U/L (ref 0–35)
AST: 17 U/L (ref 0–37)
Albumin: 3.7 g/dL (ref 3.5–5.2)
Alkaline Phosphatase: 66 U/L (ref 39–117)
Anion gap: 15 (ref 5–15)
BILIRUBIN TOTAL: 0.3 mg/dL (ref 0.3–1.2)
BUN: 11 mg/dL (ref 6–23)
CALCIUM: 9.8 mg/dL (ref 8.4–10.5)
CHLORIDE: 103 meq/L (ref 96–112)
CO2: 21 mEq/L (ref 19–32)
Creatinine, Ser: 0.98 mg/dL (ref 0.50–1.10)
GFR calc Af Amer: 85 mL/min — ABNORMAL LOW (ref >90)
GFR calc non Af Amer: 73 mL/min — ABNORMAL LOW (ref >90)
Glucose, Bld: 90 mg/dL (ref 70–99)
Potassium: 4.3 mEq/L (ref 3.7–5.3)
Sodium: 139 mEq/L (ref 137–147)
Total Protein: 7.7 g/dL (ref 6.0–8.3)

## 2014-05-31 LAB — TROPONIN I

## 2014-05-31 MED ORDER — VERAPAMIL HCL 40 MG PO TABS
40.0000 mg | ORAL_TABLET | Freq: Two times a day (BID) | ORAL | Status: DC
  Administered 2014-06-01: 40 mg via ORAL
  Filled 2014-05-31 (×2): qty 1

## 2014-05-31 MED ORDER — MOMETASONE FURO-FORMOTEROL FUM 100-5 MCG/ACT IN AERO
2.0000 | INHALATION_SPRAY | Freq: Two times a day (BID) | RESPIRATORY_TRACT | Status: DC
  Administered 2014-06-01: 2 via RESPIRATORY_TRACT
  Filled 2014-05-31: qty 8.8

## 2014-05-31 MED ORDER — GABAPENTIN 100 MG PO CAPS
300.0000 mg | ORAL_CAPSULE | Freq: Two times a day (BID) | ORAL | Status: DC
Start: 2014-06-01 — End: 2014-06-01
  Administered 2014-06-01: 300 mg via ORAL
  Filled 2014-05-31 (×3): qty 3

## 2014-05-31 MED ORDER — HYDROMORPHONE HCL 1 MG/ML IJ SOLN
1.0000 mg | Freq: Once | INTRAMUSCULAR | Status: AC
  Administered 2014-05-31: 1 mg via INTRAVENOUS
  Filled 2014-05-31: qty 1

## 2014-05-31 MED ORDER — MOMETASONE FURO-FORMOTEROL FUM 100-5 MCG/ACT IN AERO: 2.0000 | INHALATION_SPRAY | Freq: Two times a day (BID) | RESPIRATORY_TRACT | Status: DC

## 2014-05-31 MED ORDER — SODIUM CHLORIDE 0.9 % IJ SOLN
3.0000 mL | Freq: Two times a day (BID) | INTRAMUSCULAR | Status: DC
  Administered 2014-06-01: 3 mL via INTRAVENOUS

## 2014-05-31 MED ORDER — LORAZEPAM 2 MG/ML IJ SOLN
0.5000 mg | Freq: Once | INTRAMUSCULAR | Status: AC
  Administered 2014-05-31: 0.5 mg via INTRAVENOUS
  Filled 2014-05-31: qty 1

## 2014-05-31 MED ORDER — ALBUTEROL SULFATE (2.5 MG/3ML) 0.083% IN NEBU
2.5000 mg | INHALATION_SOLUTION | Freq: Four times a day (QID) | RESPIRATORY_TRACT | Status: DC
  Administered 2014-06-01 (×2): 2.5 mg via RESPIRATORY_TRACT
  Filled 2014-05-31 (×2): qty 3

## 2014-05-31 MED ORDER — ALBUTEROL (5 MG/ML) CONTINUOUS INHALATION SOLN
10.0000 mg/h | INHALATION_SOLUTION | Freq: Once | RESPIRATORY_TRACT | Status: AC
Start: 2014-05-31 — End: 2014-05-31
  Administered 2014-05-31: 10 mg/h via RESPIRATORY_TRACT
  Filled 2014-05-31: qty 20

## 2014-05-31 MED ORDER — TIOTROPIUM BROMIDE MONOHYDRATE 18 MCG IN CAPS
18.0000 ug | ORAL_CAPSULE | Freq: Every day | RESPIRATORY_TRACT | Status: DC
  Administered 2014-06-01: 18 ug via RESPIRATORY_TRACT
  Filled 2014-05-31: qty 5

## 2014-05-31 MED ORDER — ALPRAZOLAM 0.5 MG PO TABS
2.0000 mg | ORAL_TABLET | Freq: Three times a day (TID) | ORAL | Status: DC | PRN
  Administered 2014-06-01: 2 mg via ORAL
  Filled 2014-05-31: qty 4

## 2014-05-31 MED ORDER — ALBUTEROL SULFATE (2.5 MG/3ML) 0.083% IN NEBU: 2.5000 mg | INHALATION_SOLUTION | Freq: Four times a day (QID) | RESPIRATORY_TRACT | Status: DC | PRN

## 2014-05-31 MED ORDER — METHYLPREDNISOLONE SODIUM SUCC 125 MG IJ SOLR
125.0000 mg | Freq: Once | INTRAMUSCULAR | Status: AC
  Administered 2014-05-31: 125 mg via INTRAVENOUS
  Filled 2014-05-31: qty 2

## 2014-05-31 MED ORDER — SODIUM CHLORIDE 0.9 % IV BOLUS (SEPSIS)
500.0000 mL | Freq: Once | INTRAVENOUS | Status: AC
  Administered 2014-05-31: 500 mL via INTRAVENOUS

## 2014-05-31 MED ORDER — HYDROMORPHONE HCL 1 MG/ML IJ SOLN
1.0000 mg | INTRAMUSCULAR | Status: DC | PRN
  Administered 2014-06-01 (×3): 1 mg via INTRAVENOUS
  Filled 2014-05-31 (×3): qty 1

## 2014-05-31 MED ORDER — DIPHENHYDRAMINE HCL 25 MG PO CAPS
25.0000 mg | ORAL_CAPSULE | Freq: Four times a day (QID) | ORAL | Status: DC | PRN
  Filled 2014-05-31: qty 2

## 2014-05-31 MED ORDER — IPRATROPIUM-ALBUTEROL 0.5-2.5 (3) MG/3ML IN SOLN
3.0000 mL | Freq: Once | RESPIRATORY_TRACT | Status: AC
  Administered 2014-05-31: 3 mL via RESPIRATORY_TRACT
  Filled 2014-05-31: qty 3

## 2014-05-31 MED ORDER — PROMETHAZINE-CODEINE 6.25-10 MG/5ML PO SYRP
10.0000 mL | ORAL_SOLUTION | ORAL | Status: DC | PRN
  Administered 2014-06-01 (×2): 10 mL via ORAL
  Filled 2014-05-31 (×3): qty 10

## 2014-05-31 MED ORDER — FENTANYL CITRATE 0.05 MG/ML IJ SOLN
50.0000 ug | Freq: Once | INTRAMUSCULAR | Status: AC
  Administered 2014-05-31: 50 ug via INTRAVENOUS
  Filled 2014-05-31: qty 2

## 2014-05-31 MED ORDER — MAGNESIUM SULFATE 40 MG/ML IJ SOLN
2.0000 g | Freq: Once | INTRAMUSCULAR | Status: AC
  Administered 2014-05-31: 2 g via INTRAVENOUS
  Filled 2014-05-31: qty 50

## 2014-05-31 MED ORDER — ALBUTEROL SULFATE (2.5 MG/3ML) 0.083% IN NEBU: 2.5000 mg | INHALATION_SOLUTION | RESPIRATORY_TRACT | Status: DC | PRN

## 2014-05-31 MED ORDER — EPINEPHRINE 0.3 MG/0.3ML IJ SOAJ: 0.3000 mg | Freq: Once | INTRAMUSCULAR | Status: DC

## 2014-05-31 MED ORDER — ONDANSETRON HCL 4 MG PO TABS: 4.0000 mg | ORAL_TABLET | Freq: Four times a day (QID) | ORAL | Status: DC | PRN

## 2014-05-31 MED ORDER — ONDANSETRON HCL 4 MG/2ML IJ SOLN
4.0000 mg | Freq: Four times a day (QID) | INTRAMUSCULAR | Status: DC | PRN
Start: 2014-05-31 — End: 2014-06-01

## 2014-05-31 MED ORDER — ZOLPIDEM TARTRATE 5 MG PO TABS
5.0000 mg | ORAL_TABLET | Freq: Every day | ORAL | Status: DC
  Administered 2014-06-01: 5 mg via ORAL
  Filled 2014-05-31: qty 1

## 2014-05-31 MED ORDER — MORPHINE SULFATE 15 MG PO TABS
30.0000 mg | ORAL_TABLET | ORAL | Status: DC | PRN
  Administered 2014-06-01: 30 mg via ORAL
  Filled 2014-05-31: qty 2

## 2014-05-31 MED ORDER — ENOXAPARIN SODIUM 40 MG/0.4ML ~~LOC~~ SOLN
40.0000 mg | SUBCUTANEOUS | Status: DC
  Filled 2014-05-31: qty 0.4

## 2014-05-31 MED ORDER — ALBUTEROL SULFATE HFA 108 (90 BASE) MCG/ACT IN AERS: 2.0000 | INHALATION_SPRAY | RESPIRATORY_TRACT | Status: DC | PRN

## 2014-05-31 NOTE — H&P (Addendum)
[facility] History and Physical  Julie Kerr ZOX:[id number] DOB: 06/24/1976 DOA: 05/31/2014  Referring physician: ED physician PCP: Lu DuffelWELLS,WENDELL, MD   Chief Complaint: dyspnea  HPI:  Pt is 38 yo female with Asthma, COPD/bronchitis who [facility] with main concern of several days duration of progressively worsening dyspnea, initially started with exertion and earlier to day occurred at rest, associated with non productive cough and dizziness. She reports similar events in the past due to asthma flare and has required intubation in the past. She explains she has tried several treatments at home with no significant improvement in her symptoms.   In ED, pt noted to be hemodynamically stable, wheezing noted on physical exam, pt did develop tachycardia after albuterol nebulizer. TR[facility] asked to admit to telemetry unit for further evaluation.   Assessment and Plan: Active Problems: Acute on chronic hypoxic respiratory failure  - secondary to asthma exacerbation - pt is currently stable for telemetry unit admission - will continue to provide oxygen, BD's scheduled and as needed - also place on solumedrol and taper down as clinically indicated  Tachycardia - in sinus rhythm, secondary to BD's effect  Lovenox SQ for DVT prophylaxis    Radiological Exams on Admission: Dg Chest Port 1 View  05/31/2014    No active disease.    Code Status: Full Family Communication: Pt at bedside Disposition Plan: Admit for further evaluation     Review of Systems:  Constitutional: Negative for diaphoresis.  HENT: Negative for hearing loss, ear pain, nosebleeds, congestion, sore throat, neck pain, tinnitus and ear discharge.   Eyes: Negative for blurred vision, double vision, photophobia, pain, discharge and redness.  Respiratory: Negative for emoptysis, and stridor.   Cardiovascular: Negative for chest pain, palpitations, orthopnea, claudication and leg swelling.  Gastrointestinal:  Negative for nausea, vomiting and abdominal pain.  Genitourinary: Negative for dysuria, urgency, frequency, hematuria and flank pain.  Musculoskeletal: Negative for myalgias, back pain, joint pain and falls.  Skin: Negative for itching and rash.  Neurological: Negative for tingling, tremors, sensory change, speech change, focal weakness, and headaches.  Endo/Heme/Allergies: Negative for environmental allergies and polydipsia. Does not bruise/bleed easily.  Psychiatric/Behavioral: Negative for suicidal ideas. The patient is not nervous/anxious.      Past Medical History  Diagnosis Date  . Asthma   . Anxiety   . COPD (chronic obstructive pulmonary disease)   . Bronchitis   . Heart murmur   . Shortness of breath     Past Surgical History  Procedure Laterality Date  . Spinal fusion      Social History:  reports that she has been smoking Cigarettes.  She has a 34 pack-year smoking history. She has never used smokeless tobacco. She reports that she drinks alcohol. She reports that she does not use illicit drugs.  Allergies  Allergen Reactions  . Robitussin Dm [Dextromethorphan-Guaifenesin] Nausea And Vomiting  . Nsaids Hives  . Rayon, Purified Hives  . Tramadol Hives    Family History  Problem Relation Age of Onset  . HIV Mother   . Heart disease Father   . CVA Father   . Heart disease Other   . Emphysema Maternal Grandmother     smoked  . Asthma Sister   . Clotting disorder Sister   . Clotting disorder Maternal Grandmother   . Lung cancer Maternal Grandmother     smoked    Prior to Admission medications   Medication Sig Start Date End Date Taking? Authorizing Provider  albuterol [facility](PROAIR HFA)  108 (90 BASE) MCG/ACT inhaler Inhale 3-4 puffs into the lungs every 6 (six) hours as needed for wheezing or shortness of breath.   Yes Historical Provider, MD  albuterol (PROVENTIL HFA;VENTOLIN HFA) 108 (90 BASE) MCG/ACT inhaler Inhale 2 puffs into the lungs every 4 (four) hours as  needed for wheezing or shortness of breath.   Yes Historical Provider, MD  albuterol (PROVENTIL) (2.5 MG/3ML) 0.083% nebulizer solution Take 2.5 mg by nebulization every 6 (six) hours as needed for wheezing or shortness of breath.   Yes Historical Provider, MD  alprazolam Prudy Feeler) 2 MG tablet Take 1 tablet (2 mg total) by mouth 3 (three) times daily as needed for anxiety. 05/07/14  Yes Vassie Loll, MD  diphenhydrAMINE (BENADRYL) 12.5 MG/5ML elixir Take 25 mg by mouth 4 (four) times daily as needed for itching.   Yes Historical Provider, MD  diphenhydrAMINE (BENADRYL) 25 MG tablet Take 25-50 mg by mouth every 6 (six) hours as needed for itching.   Yes Historical Provider, MD  ENSURE (ENSURE) Take 237 mLs by mouth 3 (three) times daily between meals.   Yes Historical Provider, MD  FENTANYL TD Place 1 patch onto the skin every 3 (three) days.   Yes Historical Provider, MD  Fluticasone-Salmeterol (ADVAIR) 250-50 MCG/DOSE AEPB Inhale 1 puff into the lungs 2 (two) times daily.   Yes Historical Provider, MD  gabapentin (NEURONTIN) 100 MG capsule Take 200-300 mg by mouth 3 (three) times daily. 300mg  in the morning, 200mg  in the afternoon and 300mg  at bedtime.   Yes Historical Provider, MD  guaiFENesin-codeine (ROBITUSSIN AC) 100-10 MG/5ML syrup Take 10 mLs by mouth 3 (three) times daily as needed for cough.   Yes Historical Provider, MD  mometasone-formoterol (DULERA) 100-5 MCG/ACT AERO Inhale 2 puffs into the lungs 2 (two) times daily.   Yes Historical Provider, MD  morphine (MSIR) 30 MG tablet Take 30 mg by mouth every 4 (four) hours as needed for severe pain.   Yes Historical Provider, MD  Multiple Vitamins-Minerals (MULTIVITAMIN WITH MINERALS) tablet Take 1 tablet by mouth every morning.   Yes Historical Provider, MD  OxyCODONE HCl (ROXICODONE PO) Take 20 mg by mouth every 4 (four) hours as needed (for break thru pain).   Yes Historical Provider, MD  predniSONE (DELTASONE) 10 MG tablet Take 6 tablets by  mouth daily X 2 days; then 4 tablets by mouth daily X 2; then 2 tablets by mouth daily X 2 days; then 1 tablet by mouth daily X 2 days and stop prednisone. 05/07/14  Yes Vassie Loll, MD  PROMETHAZINE-CODEINE PO Take 30 mLs by mouth every 6 (six) hours as needed (cough/pain).   Yes Historical Provider, MD  tiotropium (SPIRIVA) 18 MCG inhalation capsule Place 18 mcg into inhaler and inhale daily.   Yes Historical Provider, MD  VERAPAMIL HCL PO Take 1 tablet by mouth every morning.   Yes Historical Provider, MD  zolpidem (AMBIEN) 10 MG tablet Take 10 mg by mouth at bedtime.   Yes Historical Provider, MD    Physical Exam: Filed Vitals:   05/31/14 2130 05/31/14 2200 05/31/14 2230 05/31/14 2300  BP: 106/82 91/38 101/54 115/70  Pulse: 123 116 122 126  Resp: 27 16 20  33  SpO2: 97% 97% 99% 99%    Physical Exam  Constitutional: Appears well-developed and well-nourished. No distress.  HENT: Normocephalic. External right and left ear normal. Oropharynx is clear and moist.  Eyes: Conjunctivae and EOM are normal. PERRLA, no scleral icterus.  Neck: Normal ROM. Neck  supple. No JVD. No tracheal deviation. No thyromegaly.  CVS: Regular rhythm, tachycardic , S1/S2 +, no murmurs, no gallops, no carotid bruit.  Pulmonary: Expiratory wheezing noted bilaterally, diminished breath sounds at bases  Abdominal: Soft. BS +,  no distension, tenderness, rebound or guarding.  Musculoskeletal: Normal range of motion. No edema and no tenderness.  Lymphadenopathy: No lymphadenopathy noted, cervical, inguinal. Neuro: Alert. Normal reflexes, muscle tone coordination. No cranial nerve deficit. Skin: Skin is warm and dry. No rash noted. Not diaphoretic. No erythema. No pallor.  Psychiatric: Normal mood and affect. Behavior, judgment, thought content normal.   Labs on Admission:  Basic Metabolic Panel:  Recent Labs Lab 05/31/14 1930  NA 139  K 4.3  CL 103  CO2 21  GLUCOSE 90  BUN 11  CREATININE 0.98  CALCIUM  9.8   Liver Function Tests:  Recent Labs Lab 05/31/14 1930  AST 17  ALT 11  ALKPHOS 66  BILITOT 0.3  PROT 7.7  ALBUMIN 3.7   CBC:  Recent Labs Lab 05/31/14 1930  WBC 9.1  NEUTROABS 5.3  HGB 12.0  HCT 36.0  MCV 76.9*  PLT 356   Cardiac Enzymes:  Recent Labs Lab 05/31/14 1930  TROPONINI <0.30    EKG: Normal sinus rhythm, no ST/T wave changes  Debbora PrestoMAGICK-Abdulwahab Demelo, MD  [facility] Pager [phone]  If 7PM-7AM, please contact night-coverage [email or web address] Password [email or web address] 05/31/2014, 11:16 PM

## 2014-05-31 NOTE — Progress Notes (Signed)
Pt started on continuous neb at 1932 by this RT, waiting on Xray. Will continue to monitor.

## 2014-05-31 NOTE — ED Provider Notes (Signed)
CSN: [id number]     Arrival date & time 05/31/14  1836 History   First MD Initiated Contact with Patient 05/31/14 1856     Chief Complaint  Patient presents with  . Shortness of Breath     (Consider location/radiation/quality/duration/timing/severity/associated sxs/prior Treatment) Patient is a 38 y.o. female presenting with shortness of breath. The history is provided by the patient.  Shortness of Breath Severity:  Moderate Onset quality:  Gradual Duration:  2 days Timing:  Constant Progression:  Worsening Chronicity:  Recurrent Context comment:  Asthma exacerbation Relieved by:  Nothing Worsened by:  Nothing tried Ineffective treatments:  None tried Associated symptoms: chest pain   Associated symptoms: no abdominal pain, no cough, no fever, no headaches, no neck pain and no vomiting     Past Medical History  Diagnosis Date  . Asthma   . Anxiety   . COPD (chronic obstructive pulmonary disease)   . Bronchitis   . Heart murmur   . Shortness of breath    Past Surgical History  Procedure Laterality Date  . Spinal fusion     Family History  Problem Relation Age of Onset  . HIV Mother   . Heart disease Father   . CVA Father   . Heart disease Other   . Emphysema Maternal Grandmother     smoked  . Asthma Sister   . Clotting disorder Sister   . Clotting disorder Maternal Grandmother   . Lung cancer Maternal Grandmother     smoked   History  Substance Use Topics  . Smoking status: Current Every Day Smoker -- 2.00 packs/day for 17 years    Types: Cigarettes  . Smokeless tobacco: Never Used  . Alcohol Use: Yes     Comment: occasional   OB History   Grav Para Term Preterm Abortions TAB SAB Ect Mult Living                 Review of Systems  Constitutional: Negative for fever and fatigue.  HENT: Negative for congestion and drooling.   Eyes: Negative for pain.  Respiratory: Positive for shortness of breath. Negative for cough.   Cardiovascular: Positive for  chest pain.       Syncope  Gastrointestinal: Negative for nausea, vomiting, abdominal pain and diarrhea.  Genitourinary: Negative for dysuria and hematuria.  Musculoskeletal: Negative for back pain, gait problem and neck pain.  Skin: Negative for color change.  Neurological: Negative for dizziness and headaches.  Hematological: Negative for adenopathy.  Psychiatric/Behavioral: Negative for behavioral problems.  All other systems reviewed and are negative.     Allergies  Robitussin dm; Rayon, purified; Nsaids; and Tramadol  Home Medications   Prior to Admission medications   Medication Sig Start Date End Date Taking? Authorizing Provider  albuterol (PROVENTIL HFA;VENTOLIN HFA) 108 (90 BASE) MCG/ACT inhaler Inhale 2 puffs into the lungs every 4 (four) hours as needed for wheezing or shortness of breath.    Historical Provider, MD  albuterol (PROVENTIL) (2.5 MG/3ML) 0.083% nebulizer solution Take 2.5 mg by nebulization every 6 (six) hours as needed for wheezing or shortness of breath.    Historical Provider, MD  alprazolam Prudy Feeler(XANAX) 2 MG tablet Take 1 tablet (2 mg total) by mouth 3 (three) times daily as needed for anxiety. 05/07/14   Vassie Lollarlos Madera, MD  mometasone-formoterol [facility](DULERA) 100-5 MCG/ACT AERO Inhale 2 puffs into the lungs 2 (two) times daily.    Historical Provider, MD  oxyCODONE (ROXICODONE) 15 MG immediate release tablet Take 1 tablet (  15 mg total) by mouth 3 (three) times daily as needed for pain. 05/19/14   Erick Colace, MD  predniSONE (DELTASONE) 10 MG tablet Take 6 tablets by mouth daily X 2 days; then 4 tablets by mouth daily X 2; then 2 tablets by mouth daily X 2 days; then 1 tablet by mouth daily X 2 days and stop prednisone. 05/07/14   Vassie Loll, MD   LMP 05/15/2014 Physical Exam  Nursing note and vitals reviewed. Constitutional: She is oriented to person, place, and time. She appears well-developed and well-nourished.  HENT:  Head: Normocephalic.   Mouth/Throat: No oropharyngeal exudate.  Eyes: Conjunctivae and EOM are normal. Pupils are equal, round, and reactive to light.  Neck: Normal range of motion. Neck supple.  Cardiovascular: Normal rate, regular rhythm, normal heart sounds and intact distal pulses.  Exam reveals no gallop and no friction rub.   No murmur heard. Pulmonary/Chest: She is in respiratory distress. She has wheezes.  Diffuse wheezing bilaterally. Decreased breath sounds bilaterally. Some component of stridor noted as well.  Abdominal: Soft. Bowel sounds are normal. There is no tenderness. There is no rebound and no guarding.  Musculoskeletal: Normal range of motion. She exhibits no edema and no tenderness.  Neurological: She is alert and oriented to person, place, and time.  Skin: Skin is warm and dry.  Psychiatric: She has a normal mood and affect. Her behavior is normal.    ED Course  Procedures (including critical care time) Labs Review Labs Reviewed  CBC WITH DIFFERENTIAL - Abnormal; Notable for the following:    MCV 76.9 (*)    MCH 25.6 (*)    RDW 15.6 (*)    All other components within normal limits  COMPREHENSIVE METABOLIC PANEL - Abnormal; Notable for the following:    GFR calc non Af Amer 73 (*)    GFR calc Af Amer 85 (*)    All other components within normal limits  TROPONIN I    Imaging Review Dg Chest Port 1 View  05/31/2014   CLINICAL DATA:  Severe shortness of breath, initial evaluation  EXAM: PORTABLE CHEST - 1 VIEW  COMPARISON:  05/06/2014  FINDINGS: The heart size and mediastinal contours are within normal limits. Both lungs are clear. The visualized skeletal structures are unremarkable.  IMPRESSION: No active disease.   Electronically Signed   By: Esperanza Heir M.D.   On: 05/31/2014 20:25     EKG Interpretation   Date/Time:  Saturday May 31 2014 18:47:15 EDT Ventricular Rate:  98 PR Interval:  156 QRS Duration: 72 QT Interval:  344 QTC Calculation: 439 R Axis:   65 Text  Interpretation:  Normal sinus rhythm Normal ECG some artifact  limiting interpretation, but otherwise no significant change from previous  Confirmed by Kayo Zion  MD, Zhane Donlan ([id number]) on 05/31/2014 7:39:48 PM      MDM   Final diagnoses:  SOB (shortness of breath)  Syncope, unspecified syncope type  Other chest pain    7:26 PM 38 y.o. female the history of vocal cord dysfunction, frequent asthma exacerbations requiring admission and self reported 3 intubations in the past who presents with shortness of breath over the last 2 days. She attributes it to her asthma. She denies any significant cough. No fevers. She states that she has had some chest pain throughout the day today and syncopized in the bathroom earlier this morning. She is only able to speak a few words at a time and getting history is currently difficult.  Will go ahead and treat her with steroids, magnesium, fentanyl for her chest pain , and a continuous albuterol treatment to improve her respiratory status.  There is a component of stridor, possibly related to her history of vocal cord dysfunction. She has a hx of acute respiratory failure thought to be multifactorial including tobacco use, poorly controlled asthma, panic disorder, psychogenic.  11:02 PM Pt having waxing/waning sx in the ED, definitely some improvement. Will admit to hospitalist.   Purvis SheffieldForrest Amaiah Cristiano, MD 05/31/14 2303

## 2014-05-31 NOTE — ED Notes (Signed)
REPORT ATTEMPTED X1 

## 2014-05-31 NOTE — ED Notes (Signed)
Pt presents to ED c/o of SOB and chest pain. Pt has expiratory wheezes in bases. Pt states she "felt dizzy this morning while on the toilet and passed out. My daughter found me a couple min later." Pt states she has had 4 breathing tx at home today with no relief. Pt tachypenic in triage.

## 2014-05-31 NOTE — ED Notes (Signed)
pts wife Julie Kerr phone # [phone]

## 2014-06-01 DIAGNOSIS — R0602 Shortness of breath: Secondary | ICD-10-CM

## 2014-06-01 DIAGNOSIS — J9600 Acute respiratory failure, unspecified whether with hypoxia or hypercapnia: Secondary | ICD-10-CM

## 2014-06-01 LAB — CBC
HEMATOCRIT: 31.4 % — AB (ref 36.0–46.0)
Hemoglobin: 10.2 g/dL — ABNORMAL LOW (ref 12.0–15.0)
MCH: 25.1 pg — ABNORMAL LOW (ref 26.0–34.0)
MCHC: 32.5 g/dL (ref 30.0–36.0)
MCV: 77.3 fL — ABNORMAL LOW (ref 78.0–100.0)
Platelets: 298 10*3/uL (ref 150–400)
RBC: 4.06 MIL/uL (ref 3.87–5.11)
RDW: 15.7 % — ABNORMAL HIGH (ref 11.5–15.5)
WBC: 12.8 10*3/uL — AB (ref 4.0–10.5)

## 2014-06-01 LAB — BASIC METABOLIC PANEL
Anion gap: 17 — ABNORMAL HIGH (ref 5–15)
BUN: 15 mg/dL (ref 6–23)
CALCIUM: 9.2 mg/dL (ref 8.4–10.5)
CO2: 19 meq/L (ref 19–32)
CREATININE: 0.94 mg/dL (ref 0.50–1.10)
Chloride: 98 mEq/L (ref 96–112)
GFR calc Af Amer: 89 mL/min — ABNORMAL LOW (ref >90)
GFR calc non Af Amer: 77 mL/min — ABNORMAL LOW (ref >90)
GLUCOSE: 343 mg/dL — AB (ref 70–99)
Potassium: 3.9 mEq/L (ref 3.7–5.3)
Sodium: 134 mEq/L — ABNORMAL LOW (ref 137–147)

## 2014-06-01 LAB — HEMOGLOBIN A1C
Hgb A1c MFr Bld: 5.3 % (ref <5.7)
Mean Plasma Glucose: 105 mg/dL (ref <117)

## 2014-06-01 MED ORDER — PREDNISONE (PAK) 10 MG PO TABS: ORAL_TABLET | Freq: Every day | ORAL | Status: DC

## 2014-06-01 MED ORDER — ALBUTEROL SULFATE (2.5 MG/3ML) 0.083% IN NEBU: 2.5000 mg | INHALATION_SOLUTION | RESPIRATORY_TRACT | Status: DC | PRN

## 2014-06-01 MED ORDER — NICOTINE 21 MG/24HR TD PT24: 21.0000 mg | MEDICATED_PATCH | Freq: Every day | TRANSDERMAL | Status: DC

## 2014-06-01 MED ORDER — VENLAFAXINE HCL 37.5 MG PO TABS: 37.5000 mg | ORAL_TABLET | Freq: Two times a day (BID) | ORAL | Status: DC

## 2014-06-01 MED ORDER — ALPRAZOLAM 0.5 MG PO TABS
2.0000 mg | ORAL_TABLET | Freq: Three times a day (TID) | ORAL | Status: DC
  Administered 2014-06-01 (×2): 2 mg via ORAL
  Filled 2014-06-01 (×2): qty 4

## 2014-06-01 MED ORDER — TIOTROPIUM BROMIDE MONOHYDRATE 18 MCG IN CAPS: 18.0000 ug | ORAL_CAPSULE | Freq: Every day | RESPIRATORY_TRACT | Status: DC

## 2014-06-01 MED ORDER — MOMETASONE FURO-FORMOTEROL FUM 100-5 MCG/ACT IN AERO: 2.0000 | INHALATION_SPRAY | Freq: Two times a day (BID) | RESPIRATORY_TRACT | Status: DC

## 2014-06-01 MED ORDER — NICOTINE 21 MG/24HR TD PT24
21.0000 mg | MEDICATED_PATCH | Freq: Every day | TRANSDERMAL | Status: DC
  Administered 2014-06-01: 21 mg via TRANSDERMAL
  Filled 2014-06-01: qty 1

## 2014-06-01 MED ORDER — ALBUTEROL SULFATE HFA 108 (90 BASE) MCG/ACT IN AERS: 2.0000 | INHALATION_SPRAY | RESPIRATORY_TRACT | Status: DC | PRN

## 2014-06-01 MED ORDER — GUAIFENESIN-CODEINE 100-10 MG/5ML PO SYRP: 5.0000 mL | ORAL_SOLUTION | Freq: Three times a day (TID) | ORAL | Status: DC | PRN

## 2014-06-01 MED ORDER — GABAPENTIN 100 MG PO CAPS
200.0000 mg | ORAL_CAPSULE | Freq: Every day | ORAL | Status: DC
Start: 2014-06-01 — End: 2014-06-01
  Administered 2014-06-01: 200 mg via ORAL
  Filled 2014-06-01: qty 2

## 2014-06-01 MED ORDER — PREDNISONE 50 MG PO TABS
60.0000 mg | ORAL_TABLET | Freq: Every day | ORAL | Status: DC
  Filled 2014-06-01: qty 1

## 2014-06-01 MED ORDER — IPRATROPIUM-ALBUTEROL 0.5-2.5 (3) MG/3ML IN SOLN: 3.0000 mL | RESPIRATORY_TRACT | Status: DC

## 2014-06-01 MED ORDER — METHYLPREDNISOLONE SODIUM SUCC 1000 MG IJ SOLR
250.0000 mg | Freq: Four times a day (QID) | INTRAMUSCULAR | Status: DC
Start: 2014-06-01 — End: 2014-06-01
  Administered 2014-06-01: 250 mg via INTRAVENOUS
  Filled 2014-06-01 (×3): qty 2

## 2014-06-01 NOTE — Progress Notes (Signed)
Pt assessed using Respiratory Protocol assessment guidelines. Per assessment severity score of "3", scheduled treatments are not indicated at this time. Prn treatments will be continued as previously ordered. RT will continue to monitor.

## 2014-06-01 NOTE — Discharge Summary (Signed)
Physician Discharge Summary  Julie Kerr AVW:[id number] DOB: 01/30/1976 DOA: 05/31/2014  PCP: Lu DuffelWELLS,WENDELL, MD  Admit date: 05/31/2014 Discharge date: 06/01/2014  Time spent: 30 minutes  Recommendations for Outpatient Follow-up:  1. follo wup with PCP in one week.  2. Follow up with pain clinic tomorrow.  3. Follow upwith psychiatry as outpatient.   Discharge Diagnoses:  Active Problems:   Dyspnea   SOB (shortness of breath) asthma exacerbation anxiety  Discharge Condition: little improved  Diet recommendation: regular  Filed Weights   05/31/14 2349  Weight: 81.2 kg (179 lb 0.2 oz)    History of present illness:   Pt is 38 yo female with Asthma, COPD/bronchitis who [facility] with main concern of several days duration of progressively worsening dyspnea, initially started with exertion and earlier to day occurred at rest, associated with non productive cough and dizziness. She reports similar events in the past due to asthma flare and has required intubation in the past. She explains she has tried several treatments at home with no significant improvement in her symptoms.  In ED, pt noted to be hemodynamically stable, wheezing noted on physical exam, pt did develop tachycardia after albuterol nebulizer. TRH asked to admit to telemetry unit for further evaluation.   Hospital Course:  Acute on chronic hypoxic respiratory failure  - secondary to asthma exacerbation / COPD. He wheezing has improved and she is on prednisone. She reports not having neb machine at home. Sh is very anxious and does not want to stay the night for psychiatry evaluation. She said she has an appointment at pain clinic and has to leave today. She will be discharged on prednisone taper and prescriptions for dulera and nebulizer machine.    Procedures:  none  Consultations:  Social work for counselling resources.   Discharge Exam: Filed Vitals:   06/01/14 0518  BP: 105/45  Pulse: 96   Temp: 98.3 F (36.8 C)  Resp: 18    General: alert afebrile. Cardiovascular: s1s2 Respiratory: scattered wheezing heard.   Discharge Instructions You were cared for by a hospitalist during your hospital stay. If you have any questions about your discharge medications or the care you received while you were in the hospital after you are discharged, you can call the unit and asked to speak with the hospitalist on call if the hospitalist that took care of you is not available. Once you are discharged, your primary care physician will handle any further medical issues. Please note that NO REFILLS for any discharge medications will be authorized once you are discharged, as it is imperative that you return to your primary care physician (or establish a relationship with a primary care physician if you do not have one) for your aftercare needs so that they can reassess your need for medications and monitor your lab values.  Discharge Instructions   Discharge instructions    Complete by:  As directed   Follow up with PCP in one week.  Follow up with pain clinic in am.          Current Discharge Medication List    START taking these medications   Details  nicotine (NICODERM CQ - DOSED IN MG/24 HOURS) 21 mg/24hr patch Place 1 patch (21 mg total) onto the skin daily. Qty: 28 patch, Refills: 0    predniSONE (STERAPRED UNI-PAK) 10 MG tablet Take by mouth daily. Prednisone 40 mg daily 3 days followed by Prednisone 20 mg daily for 3 days Qty: 18 tablet, Refills: 0  CONTINUE these medications which have CHANGED   Details  albuterol (PROVENTIL HFA;VENTOLIN HFA) 108 (90 BASE) MCG/ACT inhaler Inhale 2 puffs into the lungs every 4 (four) hours as needed for wheezing or shortness of breath. Qty: 18 g, Refills: 1    albuterol (PROVENTIL) (2.5 MG/3ML) 0.083% nebulizer solution Take 3 mLs (2.5 mg total) by nebulization every 4 (four) hours as needed for wheezing or shortness of breath. Qty: 75  mL, Refills: 2    guaiFENesin-codeine (ROBITUSSIN AC) 100-10 MG/5ML syrup Take 5 mLs by mouth 3 (three) times daily as needed for cough. Qty: 120 mL, Refills: 0    mometasone-formoterol (DULERA) 100-5 MCG/ACT AERO Inhale 2 puffs into the lungs 2 (two) times daily. Qty: 13 g, Refills: 1    tiotropium (SPIRIVA) 18 MCG inhalation capsule Place 1 capsule (18 mcg total) into inhaler and inhale daily. Qty: 30 capsule, Refills: 2      CONTINUE these medications which have NOT CHANGED   Details  alprazolam (XANAX) 2 MG tablet Take 1 tablet (2 mg total) by mouth 3 (three) times daily as needed for anxiety. Qty: 20 tablet, Refills: 0    diphenhydrAMINE (BENADRYL) 25 MG tablet Take 25-50 mg by mouth every 6 (six) hours as needed for itching.    ENSURE (ENSURE) Take 237 mLs by mouth 3 (three) times daily between meals.    gabapentin (NEURONTIN) 100 MG capsule Take 200-300 mg by mouth 3 (three) times daily. 300mg  in the morning, 200mg  in the afternoon and 300mg  at bedtime.    morphine (MSIR) 30 MG tablet Take 30 mg by mouth every 4 (four) hours as needed for severe pain.    Multiple Vitamins-Minerals (MULTIVITAMIN WITH MINERALS) tablet Take 1 tablet by mouth every morning.    OxyCODONE HCl (ROXICODONE PO) Take 20 mg by mouth every 4 (four) hours as needed (for break thru pain).    VERAPAMIL HCL PO Take 1 tablet by mouth every morning.    zolpidem (AMBIEN) 10 MG tablet Take 10 mg by mouth at bedtime.      STOP taking these medications     diphenhydrAMINE (BENADRYL) 12.5 MG/5ML elixir      FENTANYL TD      Fluticasone-Salmeterol (ADVAIR) 250-50 MCG/DOSE AEPB      predniSONE (DELTASONE) 10 MG tablet      PROMETHAZINE-CODEINE PO        Allergies  Allergen Reactions  . Robitussin Dm [Dextromethorphan-Guaifenesin] Nausea And Vomiting  . Nsaids Hives  . Rayon, Purified Hives  . Tramadol Hives   Follow-up Information   Follow up with WELLS,WENDELL, MD. Schedule an appointment as  soon as possible for a visit in 1 week.   Specialty:  Family Medicine       The results of significant diagnostics from this hospitalization (including imaging, microbiology, ancillary and laboratory) are listed below for reference.    Significant Diagnostic Studies: Dg Chest Port 1 View  05/31/2014   CLINICAL DATA:  Severe shortness of breath, initial evaluation  EXAM: PORTABLE CHEST - 1 VIEW  COMPARISON:  05/06/2014  FINDINGS: The heart size and mediastinal contours are within normal limits. Both lungs are clear. The visualized skeletal structures are unremarkable.  IMPRESSION: No active disease.   Electronically Signed   By: Esperanza Heir M.D.   On: 05/31/2014 20:25   Dg Chest Portable 1 View  05/06/2014   CLINICAL DATA:  Chest pain, asthma  EXAM: PORTABLE CHEST - 1 VIEW  COMPARISON:  04/13/2014  FINDINGS: The heart size  and mediastinal contours are within normal limits. Both lungs are clear. The visualized skeletal structures are unremarkable.  IMPRESSION: No active disease.   Electronically Signed   By: Elige KoHetal  Patel   On: 05/06/2014 21:46    Microbiology: No results found for this or any previous visit (from the past 240 hour(s)).   Labs: Basic Metabolic Panel:  Recent Labs Lab 05/31/14 1930 06/01/14 0401  NA 139 134*  K 4.3 3.9  CL 103 98  CO2 21 19  GLUCOSE 90 343*  BUN 11 15  CREATININE 0.98 0.94  CALCIUM 9.8 9.2   Liver Function Tests:  Recent Labs Lab 05/31/14 1930  AST 17  ALT 11  ALKPHOS 66  BILITOT 0.3  PROT 7.7  ALBUMIN 3.7   No results found for this basename: LIPASE, AMYLASE,  in the last 168 hours No results found for this basename: AMMONIA,  in the last 168 hours CBC:  Recent Labs Lab 05/31/14 1930 06/01/14 0401  WBC 9.1 12.8*  NEUTROABS 5.3  --   HGB 12.0 10.2*  HCT 36.0 31.4*  MCV 76.9* 77.3*  PLT 356 298   Cardiac Enzymes:  Recent Labs Lab 05/31/14 1930  TROPONINI <0.30   BNP: BNP (last 3 results)  Recent Labs   11/26/13 0500 03/07/14 2355 05/06/14 2328  PROBNP 10.4 <5.0 <5.0   CBG: No results found for this basename: GLUCAP,  in the last 168 hours     Signed:  Jerzy Roepke  [facility] 06/01/2014, 4:39 PM

## 2014-06-01 NOTE — Progress Notes (Signed)
Utilization Review Completed.   Hoyt Leanos, RN, BSN Nurse Case Manager  

## 2014-06-02 ENCOUNTER — Encounter: Payer: Medicaid Other | Attending: Physical Medicine & Rehabilitation | Admitting: Registered Nurse

## 2014-06-02 ENCOUNTER — Encounter: Payer: Self-pay | Admitting: Registered Nurse

## 2014-06-02 VITALS — BP 164/88 | HR 98 | Resp 14 | Ht 62.0 in | Wt 176.8 lb

## 2014-06-02 DIAGNOSIS — Z79899 Other long term (current) drug therapy: Secondary | ICD-10-CM

## 2014-06-02 DIAGNOSIS — G8929 Other chronic pain: Secondary | ICD-10-CM

## 2014-06-02 DIAGNOSIS — M961 Postlaminectomy syndrome, not elsewhere classified: Secondary | ICD-10-CM

## 2014-06-02 DIAGNOSIS — Z5181 Encounter for therapeutic drug level monitoring: Secondary | ICD-10-CM

## 2014-06-02 MED ORDER — OXYCODONE HCL 15 MG PO TABS: 15.0000 mg | ORAL_TABLET | Freq: Three times a day (TID) | ORAL | Status: DC
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
## 2014-06-06 ENCOUNTER — Encounter: Payer: Medicaid Other | Admitting: Registered Nurse

## 2014-06-06 DIAGNOSIS — Z79899 Other long term (current) drug therapy: Secondary | ICD-10-CM | POA: Diagnosis present

## 2014-06-06 DIAGNOSIS — G8929 Other chronic pain: Secondary | ICD-10-CM | POA: Diagnosis not present

## 2014-06-06 DIAGNOSIS — Z5181 Encounter for therapeutic drug level monitoring: Secondary | ICD-10-CM | POA: Diagnosis present

## 2014-06-06 DIAGNOSIS — M961 Postlaminectomy syndrome, not elsewhere classified: Secondary | ICD-10-CM | POA: Diagnosis present

## 2014-06-09 ENCOUNTER — Telehealth: Payer: Self-pay | Admitting: *Deleted

## 2014-06-09 NOTE — Telephone Encounter (Signed)
Left message for Julie Kerr to call to discuss inconsistent UDS

## 2014-06-11 NOTE — Telephone Encounter (Signed)
Notified of results of UDS showing positive signs of cocaine use.  She will be discharged from clinic.  She acknowledged understanding.

## 2014-06-30 ENCOUNTER — Ambulatory Visit: Payer: Self-pay | Admitting: Registered Nurse

## 2014-07-15 HISTORY — PX: TRACHEOSTOMY: SUR1362

## 2014-07-25 ENCOUNTER — Encounter (HOSPITAL_COMMUNITY): Payer: Self-pay | Admitting: Nurse Practitioner

## 2014-07-25 ENCOUNTER — Emergency Department (HOSPITAL_COMMUNITY): Payer: Medicaid Other

## 2014-07-25 ENCOUNTER — Inpatient Hospital Stay (HOSPITAL_COMMUNITY)
Admission: EM | Admit: 2014-07-25 | Discharge: 2014-08-06 | DRG: 004 | Disposition: A | Discharge status: Home / Self Care | Payer: Medicaid Other | Attending: Pulmonary Disease | Admitting: Pulmonary Disease

## 2014-07-25 ENCOUNTER — Inpatient Hospital Stay (HOSPITAL_COMMUNITY): Payer: Medicaid Other

## 2014-07-25 DIAGNOSIS — E872 Acidosis, unspecified: Secondary | ICD-10-CM | POA: Diagnosis present

## 2014-07-25 DIAGNOSIS — J9601 Acute respiratory failure with hypoxia: Principal | ICD-10-CM | POA: Diagnosis present

## 2014-07-25 DIAGNOSIS — Z886 Allergy status to analgesic agent status: Secondary | ICD-10-CM

## 2014-07-25 DIAGNOSIS — J9811 Atelectasis: Secondary | ICD-10-CM | POA: Diagnosis present

## 2014-07-25 DIAGNOSIS — R739 Hyperglycemia, unspecified: Secondary | ICD-10-CM | POA: Diagnosis present

## 2014-07-25 DIAGNOSIS — R0602 Shortness of breath: Secondary | ICD-10-CM | POA: Diagnosis present

## 2014-07-25 DIAGNOSIS — F17210 Nicotine dependence, cigarettes, uncomplicated: Secondary | ICD-10-CM | POA: Diagnosis present

## 2014-07-25 DIAGNOSIS — Z4659 Encounter for fitting and adjustment of other gastrointestinal appliance and device: Secondary | ICD-10-CM

## 2014-07-25 DIAGNOSIS — G9341 Metabolic encephalopathy: Secondary | ICD-10-CM | POA: Diagnosis present

## 2014-07-25 DIAGNOSIS — Z79891 Long term (current) use of opiate analgesic: Secondary | ICD-10-CM

## 2014-07-25 DIAGNOSIS — Z825 Family history of asthma and other chronic lower respiratory diseases: Secondary | ICD-10-CM | POA: Diagnosis not present

## 2014-07-25 DIAGNOSIS — G894 Chronic pain syndrome: Secondary | ICD-10-CM | POA: Diagnosis present

## 2014-07-25 DIAGNOSIS — J189 Pneumonia, unspecified organism: Secondary | ICD-10-CM | POA: Diagnosis not present

## 2014-07-25 DIAGNOSIS — E669 Obesity, unspecified: Secondary | ICD-10-CM | POA: Diagnosis present

## 2014-07-25 DIAGNOSIS — Z0189 Encounter for other specified special examinations: Secondary | ICD-10-CM

## 2014-07-25 DIAGNOSIS — J4551 Severe persistent asthma with (acute) exacerbation: Secondary | ICD-10-CM | POA: Diagnosis present

## 2014-07-25 DIAGNOSIS — Z79899 Other long term (current) drug therapy: Secondary | ICD-10-CM

## 2014-07-25 DIAGNOSIS — F1410 Cocaine abuse, uncomplicated: Secondary | ICD-10-CM | POA: Diagnosis present

## 2014-07-25 DIAGNOSIS — J9690 Respiratory failure, unspecified, unspecified whether with hypoxia or hypercapnia: Secondary | ICD-10-CM

## 2014-07-25 DIAGNOSIS — T380X5A Adverse effect of glucocorticoids and synthetic analogues, initial encounter: Secondary | ICD-10-CM | POA: Diagnosis present

## 2014-07-25 DIAGNOSIS — Z981 Arthrodesis status: Secondary | ICD-10-CM

## 2014-07-25 DIAGNOSIS — Y95 Nosocomial condition: Secondary | ICD-10-CM | POA: Diagnosis not present

## 2014-07-25 DIAGNOSIS — Z6832 Body mass index (BMI) 32.0-32.9, adult: Secondary | ICD-10-CM | POA: Diagnosis not present

## 2014-07-25 DIAGNOSIS — Z888 Allergy status to other drugs, medicaments and biological substances status: Secondary | ICD-10-CM | POA: Diagnosis not present

## 2014-07-25 DIAGNOSIS — E876 Hypokalemia: Secondary | ICD-10-CM | POA: Diagnosis not present

## 2014-07-25 DIAGNOSIS — R0603 Acute respiratory distress: Secondary | ICD-10-CM

## 2014-07-25 DIAGNOSIS — R059 Cough, unspecified: Secondary | ICD-10-CM | POA: Insufficient documentation

## 2014-07-25 DIAGNOSIS — J45901 Unspecified asthma with (acute) exacerbation: Secondary | ICD-10-CM | POA: Insufficient documentation

## 2014-07-25 DIAGNOSIS — R058 Other specified cough: Secondary | ICD-10-CM | POA: Diagnosis present

## 2014-07-25 DIAGNOSIS — F22 Delusional disorders: Secondary | ICD-10-CM

## 2014-07-25 DIAGNOSIS — F1910 Other psychoactive substance abuse, uncomplicated: Secondary | ICD-10-CM | POA: Diagnosis present

## 2014-07-25 DIAGNOSIS — I517 Cardiomegaly: Secondary | ICD-10-CM | POA: Diagnosis not present

## 2014-07-25 DIAGNOSIS — T8579XA Infection and inflammatory reaction due to other internal prosthetic devices, implants and grafts, initial encounter: Secondary | ICD-10-CM

## 2014-07-25 DIAGNOSIS — J9600 Acute respiratory failure, unspecified whether with hypoxia or hypercapnia: Secondary | ICD-10-CM

## 2014-07-25 DIAGNOSIS — R509 Fever, unspecified: Secondary | ICD-10-CM

## 2014-07-25 DIAGNOSIS — R05 Cough: Secondary | ICD-10-CM

## 2014-07-25 DIAGNOSIS — Z930 Tracheostomy status: Secondary | ICD-10-CM

## 2014-07-25 LAB — LACTIC ACID, PLASMA
Lactic Acid, Venous: 3.4 mmol/L — ABNORMAL HIGH (ref 0.5–2.2)
Lactic Acid, Venous: 5.5 mmol/L — ABNORMAL HIGH (ref 0.5–2.2)

## 2014-07-25 LAB — CBC WITH DIFFERENTIAL/PLATELET
BASOS ABS: 0 10*3/uL (ref 0.0–0.1)
Basophils Relative: 0 % (ref 0–1)
Eosinophils Absolute: 0.2 10*3/uL (ref 0.0–0.7)
Eosinophils Relative: 1 % (ref 0–5)
HEMATOCRIT: 34 % — AB (ref 36.0–46.0)
HEMOGLOBIN: 11 g/dL — AB (ref 12.0–15.0)
LYMPHS ABS: 3.5 10*3/uL (ref 0.7–4.0)
LYMPHS PCT: 15 % (ref 12–46)
MCH: 26.2 pg (ref 26.0–34.0)
MCHC: 32.4 g/dL (ref 30.0–36.0)
MCV: 81 fL (ref 78.0–100.0)
MONO ABS: 0.6 10*3/uL (ref 0.1–1.0)
MONOS PCT: 3 % (ref 3–12)
NEUTROS ABS: 18.3 10*3/uL — AB (ref 1.7–7.7)
Neutrophils Relative %: 81 % — ABNORMAL HIGH (ref 43–77)
Platelets: 271 10*3/uL (ref 150–400)
RBC: 4.2 MIL/uL (ref 3.87–5.11)
RDW: 15 % (ref 11.5–15.5)
WBC: 22.6 10*3/uL — AB (ref 4.0–10.5)

## 2014-07-25 LAB — COMPREHENSIVE METABOLIC PANEL
ALBUMIN: 3.4 g/dL — AB (ref 3.5–5.2)
ALK PHOS: 58 U/L (ref 39–117)
ALT: 10 U/L (ref 0–35)
AST: 11 U/L (ref 0–37)
Anion gap: 17 — ABNORMAL HIGH (ref 5–15)
BUN: 9 mg/dL (ref 6–23)
CO2: 18 mEq/L — ABNORMAL LOW (ref 19–32)
CREATININE: 0.67 mg/dL (ref 0.50–1.10)
Calcium: 8.4 mg/dL (ref 8.4–10.5)
Chloride: 103 mEq/L (ref 96–112)
GFR calc Af Amer: >90 mL/min (ref >90)
GFR calc non Af Amer: >90 mL/min (ref >90)
Glucose, Bld: 195 mg/dL — ABNORMAL HIGH (ref 70–99)
POTASSIUM: 2.6 meq/L — AB (ref 3.7–5.3)
Sodium: 138 mEq/L (ref 137–147)
TOTAL PROTEIN: 6.7 g/dL (ref 6.0–8.3)
Total Bilirubin: 0.2 mg/dL — ABNORMAL LOW (ref 0.3–1.2)

## 2014-07-25 LAB — URINALYSIS W MICROSCOPIC (NOT AT ARMC)
Bilirubin Urine: NEGATIVE
HGB URINE DIPSTICK: NEGATIVE
KETONES UR: NEGATIVE mg/dL
LEUKOCYTES UA: NEGATIVE
Nitrite: NEGATIVE
PH: 5 (ref 5.0–8.0)
Protein, ur: NEGATIVE mg/dL
Specific Gravity, Urine: 1.02 (ref 1.005–1.030)
Urobilinogen, UA: 0.2 mg/dL (ref 0.0–1.0)

## 2014-07-25 LAB — I-STAT VENOUS BLOOD GAS, ED
Acid-base deficit: 4 mmol/L — ABNORMAL HIGH (ref 0.0–2.0)
Acid-base deficit: 10 mmol/L — ABNORMAL HIGH (ref 0.0–2.0)
BICARBONATE: 23.4 meq/L (ref 20.0–24.0)
Bicarbonate: 17.9 mEq/L — ABNORMAL LOW (ref 20.0–24.0)
O2 Saturation: 26 %
O2 Saturation: 86 %
PCO2 VEN: 49.5 mmHg (ref 45.0–50.0)
PCO2 VEN: 44 mmHg — AB (ref 45.0–50.0)
PH VEN: 7.283 (ref 7.250–7.300)
PH VEN: 7.216 — AB (ref 7.250–7.300)
TCO2: 25 mmol/L (ref 0–100)
TCO2: 19 mmol/L (ref 0–100)
pO2, Ven: 20 mmHg — CL (ref 30.0–45.0)
pO2, Ven: 62 mmHg — ABNORMAL HIGH (ref 30.0–45.0)

## 2014-07-25 LAB — AMYLASE: AMYLASE: 90 U/L (ref 0–105)

## 2014-07-25 LAB — I-STAT ARTERIAL BLOOD GAS, ED
Acid-base deficit: 8 mmol/L — ABNORMAL HIGH (ref 0.0–2.0)
Bicarbonate: 19.3 mEq/L — ABNORMAL LOW (ref 20.0–24.0)
O2 SAT: 99 %
PCO2 ART: 47.2 mmHg — AB (ref 35.0–45.0)
PO2 ART: 180 mmHg — AB (ref 80.0–100.0)
Patient temperature: 98.6
TCO2: 21 mmol/L (ref 0–100)
pH, Arterial: 7.219 — ABNORMAL LOW (ref 7.350–7.450)

## 2014-07-25 LAB — BASIC METABOLIC PANEL
ANION GAP: 17 — AB (ref 5–15)
BUN: 8 mg/dL (ref 6–23)
CALCIUM: 9.7 mg/dL (ref 8.4–10.5)
CO2: 20 meq/L (ref 19–32)
CREATININE: 0.72 mg/dL (ref 0.50–1.10)
Chloride: 102 mEq/L (ref 96–112)
GFR calc Af Amer: >90 mL/min (ref >90)
GFR calc non Af Amer: >90 mL/min (ref >90)
Glucose, Bld: 94 mg/dL (ref 70–99)
Potassium: 4 mEq/L (ref 3.7–5.3)
Sodium: 139 mEq/L (ref 137–147)

## 2014-07-25 LAB — POCT I-STAT 3, ART BLOOD GAS (G3+)
ACID-BASE DEFICIT: 12 mmol/L — AB (ref 0.0–2.0)
Bicarbonate: 14.2 mEq/L — ABNORMAL LOW (ref 20.0–24.0)
O2 SAT: 97 %
PO2 ART: 109 mmHg — AB (ref 80.0–100.0)
TCO2: 15 mmol/L (ref 0–100)
pCO2 arterial: 32 mmHg — ABNORMAL LOW (ref 35.0–45.0)
pH, Arterial: 7.254 — ABNORMAL LOW (ref 7.350–7.450)

## 2014-07-25 LAB — RAPID URINE DRUG SCREEN, HOSP PERFORMED
Amphetamines: NOT DETECTED
Barbiturates: NOT DETECTED
Benzodiazepines: POSITIVE — AB
COCAINE: NOT DETECTED
Opiates: NOT DETECTED
Tetrahydrocannabinol: NOT DETECTED

## 2014-07-25 LAB — TROPONIN I

## 2014-07-25 LAB — PROCALCITONIN: Procalcitonin: <0.1 ng/mL

## 2014-07-25 LAB — CBC
HEMATOCRIT: 37.7 % (ref 36.0–46.0)
Hemoglobin: 12.2 g/dL (ref 12.0–15.0)
MCH: 26.1 pg (ref 26.0–34.0)
MCHC: 32.4 g/dL (ref 30.0–36.0)
MCV: 80.7 fL (ref 78.0–100.0)
PLATELETS: 320 10*3/uL (ref 150–400)
RBC: 4.67 MIL/uL (ref 3.87–5.11)
RDW: 14.9 % (ref 11.5–15.5)
WBC: 8.6 10*3/uL (ref 4.0–10.5)

## 2014-07-25 LAB — TRIGLYCERIDES: Triglycerides: 79 mg/dL (ref <150)

## 2014-07-25 LAB — SALICYLATE LEVEL

## 2014-07-25 LAB — PROTIME-INR
INR: 1.17 (ref 0.00–1.49)
Prothrombin Time: 15.1 seconds (ref 11.6–15.2)

## 2014-07-25 LAB — D-DIMER, QUANTITATIVE (NOT AT ARMC): D DIMER QUANT: 0.37 ug{FEU}/mL (ref 0.00–0.48)

## 2014-07-25 LAB — APTT: APTT: 26 s (ref 24–37)

## 2014-07-25 LAB — LIPASE, BLOOD: LIPASE: 22 U/L (ref 11–59)

## 2014-07-25 LAB — STREP PNEUMONIAE URINARY ANTIGEN: STREP PNEUMO URINARY ANTIGEN: NEGATIVE

## 2014-07-25 LAB — GLUCOSE, CAPILLARY: Glucose-Capillary: 231 mg/dL — ABNORMAL HIGH (ref 70–99)

## 2014-07-25 LAB — PRO B NATRIURETIC PEPTIDE
Pro B Natriuretic peptide (BNP): <5 pg/mL (ref 0–125)
Pro B Natriuretic peptide (BNP): <5 pg/mL (ref 0–125)

## 2014-07-25 LAB — PHOSPHORUS: Phosphorus: 2.2 mg/dL — ABNORMAL LOW (ref 2.3–4.6)

## 2014-07-25 LAB — MRSA PCR SCREENING: MRSA BY PCR: NEGATIVE

## 2014-07-25 LAB — MAGNESIUM: MAGNESIUM: 2.5 mg/dL (ref 1.5–2.5)

## 2014-07-25 MED ORDER — IPRATROPIUM BROMIDE 0.02 % IN SOLN
RESPIRATORY_TRACT | Status: AC
  Administered 2014-07-25: 17:00:00
  Filled 2014-07-25: qty 5

## 2014-07-25 MED ORDER — MAGNESIUM SULFATE 2 GM/50ML IV SOLN
2.0000 g | Freq: Once | INTRAVENOUS | Status: AC
  Administered 2014-07-25: 2 g via INTRAVENOUS
  Filled 2014-07-25: qty 50

## 2014-07-25 MED ORDER — PROPOFOL 10 MG/ML IV EMUL
INTRAVENOUS | Status: AC
Start: 2014-07-25 — End: 2014-07-26
  Filled 2014-07-25: qty 100

## 2014-07-25 MED ORDER — KETAMINE HCL 10 MG/ML IJ SOLN: 150.0000 mg | Freq: Once | INTRAMUSCULAR | Status: AC

## 2014-07-25 MED ORDER — MIDAZOLAM HCL 2 MG/2ML IJ SOLN: 1.0000 mg | INTRAMUSCULAR | Status: DC | PRN

## 2014-07-25 MED ORDER — PANTOPRAZOLE SODIUM 40 MG IV SOLR
40.0000 mg | Freq: Every day | INTRAVENOUS | Status: DC
  Administered 2014-07-25 – 2014-07-27 (×3): 40 mg via INTRAVENOUS
  Filled 2014-07-25 (×3): qty 40

## 2014-07-25 MED ORDER — ENOXAPARIN SODIUM 40 MG/0.4ML ~~LOC~~ SOLN
40.0000 mg | SUBCUTANEOUS | Status: DC
  Administered 2014-07-25 – 2014-08-05 (×12): 40 mg via SUBCUTANEOUS
  Filled 2014-07-25 (×15): qty 0.4

## 2014-07-25 MED ORDER — SODIUM CHLORIDE 0.9 % IV SOLN: 250.0000 mL | INTRAVENOUS | Status: DC | PRN

## 2014-07-25 MED ORDER — ROCURONIUM BROMIDE 50 MG/5ML IV SOLN
INTRAVENOUS | Status: AC
  Filled 2014-07-25: qty 2

## 2014-07-25 MED ORDER — ALBUTEROL (5 MG/ML) CONTINUOUS INHALATION SOLN
15.0000 mg/h | INHALATION_SOLUTION | Freq: Once | RESPIRATORY_TRACT | Status: AC
  Administered 2014-07-25: 15 mg/h via RESPIRATORY_TRACT

## 2014-07-25 MED ORDER — LORAZEPAM 2 MG/ML IJ SOLN
1.0000 mg | Freq: Once | INTRAMUSCULAR | Status: AC
  Administered 2014-07-25: 1 mg via INTRAVENOUS
  Filled 2014-07-25: qty 1

## 2014-07-25 MED ORDER — FENTANYL CITRATE 0.05 MG/ML IJ SOLN
100.0000 ug | INTRAMUSCULAR | Status: DC | PRN
  Administered 2014-07-25 – 2014-07-26 (×3): 100 ug via INTRAVENOUS
  Filled 2014-07-25 (×4): qty 2

## 2014-07-25 MED ORDER — IPRATROPIUM BROMIDE 0.02 % IN SOLN
1.0000 mg | Freq: Once | RESPIRATORY_TRACT | Status: AC
  Administered 2014-07-25: 0.5 mg via RESPIRATORY_TRACT

## 2014-07-25 MED ORDER — LEVALBUTEROL HCL 0.63 MG/3ML IN NEBU
0.6300 mg | INHALATION_SOLUTION | Freq: Four times a day (QID) | RESPIRATORY_TRACT | Status: DC
  Administered 2014-07-25 – 2014-08-06 (×44): 0.63 mg via RESPIRATORY_TRACT
  Filled 2014-07-25 (×86): qty 3

## 2014-07-25 MED ORDER — LIDOCAINE HCL (CARDIAC) 20 MG/ML IV SOLN
INTRAVENOUS | Status: AC
  Filled 2014-07-25: qty 5

## 2014-07-25 MED ORDER — KETAMINE HCL 10 MG/ML IJ SOLN: 80.0000 mg | Freq: Once | INTRAMUSCULAR | Status: DC

## 2014-07-25 MED ORDER — KETAMINE HCL 10 MG/ML IJ SOLN
INTRAMUSCULAR | Status: DC | PRN
  Administered 2014-07-25: 120 mg via INTRAVENOUS
  Administered 2014-07-25: 30 mg via INTRAVENOUS

## 2014-07-25 MED ORDER — MIDAZOLAM HCL 2 MG/2ML IJ SOLN
2.0000 mg | Freq: Once | INTRAMUSCULAR | Status: AC
  Administered 2014-07-25: 2 mg via INTRAVENOUS

## 2014-07-25 MED ORDER — CETYLPYRIDINIUM CHLORIDE 0.05 % MT LIQD
7.0000 mL | Freq: Four times a day (QID) | OROMUCOSAL | Status: DC
  Administered 2014-07-26 (×4): 7 mL via OROMUCOSAL

## 2014-07-25 MED ORDER — ROCURONIUM BROMIDE 50 MG/5ML IV SOLN
INTRAVENOUS | Status: DC | PRN
Start: 2014-07-25 — End: 2014-07-26
  Administered 2014-07-25: 80 mg via INTRAVENOUS

## 2014-07-25 MED ORDER — SODIUM CHLORIDE 0.9 % IV BOLUS (SEPSIS)
1000.0000 mL | Freq: Once | INTRAVENOUS | Status: AC
  Administered 2014-07-25: 1000 mL via INTRAVENOUS

## 2014-07-25 MED ORDER — PROPOFOL 10 MG/ML IV EMUL
5.0000 ug/kg/min | INTRAVENOUS | Status: DC
  Administered 2014-07-25 – 2014-07-26 (×2): 80 ug/kg/min via INTRAVENOUS
  Filled 2014-07-25 (×2): qty 200

## 2014-07-25 MED ORDER — POTASSIUM CHLORIDE 20 MEQ/15ML (10%) PO SOLN
40.0000 meq | ORAL | Status: AC
  Administered 2014-07-25 (×3): 40 meq
  Filled 2014-07-25 (×4): qty 30

## 2014-07-25 MED ORDER — CHLORHEXIDINE GLUCONATE 0.12 % MT SOLN
15.0000 mL | Freq: Two times a day (BID) | OROMUCOSAL | Status: DC
  Administered 2014-07-25 – 2014-07-26 (×2): 15 mL via OROMUCOSAL
  Filled 2014-07-25: qty 15

## 2014-07-25 MED ORDER — MIDAZOLAM HCL 2 MG/2ML IJ SOLN
2.0000 mg | INTRAMUSCULAR | Status: DC | PRN
  Administered 2014-07-25: 2 mg via INTRAVENOUS

## 2014-07-25 MED ORDER — IPRATROPIUM BROMIDE 0.02 % IN SOLN
0.5000 mg | Freq: Four times a day (QID) | RESPIRATORY_TRACT | Status: DC
  Administered 2014-07-25 – 2014-08-06 (×45): 0.5 mg via RESPIRATORY_TRACT
  Filled 2014-07-25 (×47): qty 2.5

## 2014-07-25 MED ORDER — SUCCINYLCHOLINE CHLORIDE 20 MG/ML IJ SOLN
INTRAMUSCULAR | Status: AC
  Filled 2014-07-25: qty 1

## 2014-07-25 MED ORDER — MIDAZOLAM HCL 2 MG/2ML IJ SOLN
2.0000 mg | INTRAMUSCULAR | Status: DC | PRN
  Filled 2014-07-25: qty 2

## 2014-07-25 MED ORDER — METHYLPREDNISOLONE SODIUM SUCC 125 MG IJ SOLR
125.0000 mg | Freq: Once | INTRAMUSCULAR | Status: AC
  Administered 2014-07-25: 125 mg via INTRAVENOUS
  Filled 2014-07-25: qty 2

## 2014-07-25 MED ORDER — PROPOFOL 10 MG/ML IV EMUL
INTRAVENOUS | Status: DC | PRN
  Administered 2014-07-25: 10 ug/kg/min via INTRAVENOUS
  Administered 2014-07-25: 1000 mg via INTRAVENOUS

## 2014-07-25 MED ORDER — PROPOFOL 10 MG/ML IV EMUL: 5.0000 ug/kg/min | Freq: Once | INTRAVENOUS | Status: DC

## 2014-07-25 MED ORDER — POTASSIUM CHLORIDE 20 MEQ PO PACK: 40.0000 meq | PACK | ORAL | Status: DC

## 2014-07-25 MED ORDER — HEPARIN SODIUM (PORCINE) 5000 UNIT/ML IJ SOLN: 5000.0000 [IU] | Freq: Three times a day (TID) | INTRAMUSCULAR | Status: DC

## 2014-07-25 MED ORDER — INSULIN ASPART 100 UNIT/ML ~~LOC~~ SOLN
0.0000 [IU] | SUBCUTANEOUS | Status: DC
  Administered 2014-07-25 (×2): 7 [IU] via SUBCUTANEOUS
  Administered 2014-07-26: 3 [IU] via SUBCUTANEOUS
  Administered 2014-07-26: 4 [IU] via SUBCUTANEOUS
  Administered 2014-07-26 – 2014-07-27 (×4): 3 [IU] via SUBCUTANEOUS
  Administered 2014-07-28: 7 [IU] via SUBCUTANEOUS
  Administered 2014-07-28 – 2014-07-29 (×3): 3 [IU] via SUBCUTANEOUS
  Administered 2014-07-29: 4 [IU] via SUBCUTANEOUS
  Administered 2014-07-29: 7 [IU] via SUBCUTANEOUS
  Administered 2014-07-30 – 2014-08-01 (×2): 3 [IU] via SUBCUTANEOUS

## 2014-07-25 MED ORDER — DEXTROSE-NACL 5-0.45 % IV SOLN
INTRAVENOUS | Status: DC
  Administered 2014-07-25 – 2014-07-27 (×2): via INTRAVENOUS
  Administered 2014-07-28: 50 mL/h via INTRAVENOUS

## 2014-07-25 MED ORDER — ALBUTEROL (5 MG/ML) CONTINUOUS INHALATION SOLN
INHALATION_SOLUTION | RESPIRATORY_TRACT | Status: AC
  Administered 2014-07-25: 17:00:00
  Filled 2014-07-25: qty 20

## 2014-07-25 MED ORDER — MIDAZOLAM HCL 2 MG/2ML IJ SOLN
2.0000 mg | INTRAMUSCULAR | Status: DC | PRN
  Filled 2014-07-25 (×2): qty 2

## 2014-07-25 MED ORDER — ETOMIDATE 2 MG/ML IV SOLN
INTRAVENOUS | Status: AC
  Filled 2014-07-25: qty 20

## 2014-07-25 NOTE — ED Notes (Signed)
She is here for an asthma attack, she has been using hand held nebulizer with no relief. She is unable to speak words right now. Tachypneic, audible expiratory wheezing.

## 2014-07-25 NOTE — ED Provider Notes (Signed)
CSN: [id number]     Arrival date & time 07/25/14  1707 History   First MD Initiated Contact with Patient 07/25/14 1713     Chief Complaint  Patient presents with  . Asthma     (Consider location/radiation/quality/duration/timing/severity/associated sxs/prior Treatment) Patient is a 38 y.o. female presenting with asthma. The history is provided by the patient.  Asthma This is a recurrent problem. The current episode started 6 to 12 hours ago. The problem occurs constantly. The problem has been rapidly worsening. Pertinent negatives include no chest pain and no abdominal pain. Nothing aggravates the symptoms.    Past Medical History  Diagnosis Date  . Asthma   . Anxiety   . COPD (chronic obstructive pulmonary disease)   . Bronchitis   . Heart murmur   . Shortness of breath    Past Surgical History  Procedure Laterality Date  . Spinal fusion     Family History  Problem Relation Age of Onset  . HIV Mother   . Heart disease Father   . CVA Father   . Heart disease Other   . Emphysema Maternal Grandmother     smoked  . Asthma Sister   . Clotting disorder Sister   . Clotting disorder Maternal Grandmother   . Lung cancer Maternal Grandmother     smoked   History  Substance Use Topics  . Smoking status: Current Every Day Smoker -- 2.00 packs/day for 17 years    Types: Cigarettes  . Smokeless tobacco: Never Used  . Alcohol Use: Yes     Comment: occasional   OB History    No data available     Review of Systems  Unable to perform ROS: Acuity of condition  Cardiovascular: Negative for chest pain.  Gastrointestinal: Negative for abdominal pain.      Allergies  Robitussin dm; Nsaids; Rayon, purified; and Tramadol  Home Medications   Prior to Admission medications   Medication Sig Start Date End Date Taking? Authorizing Provider  albuterol (PROVENTIL HFA;VENTOLIN HFA) 108 (90 BASE) MCG/ACT inhaler Inhale 2 puffs into the lungs every 4 (four) hours as needed for  wheezing or shortness of breath. 06/01/14   Kathlen Mody, MD  albuterol (PROVENTIL) (2.5 MG/3ML) 0.083% nebulizer solution Take 3 mLs (2.5 mg total) by nebulization every 4 (four) hours as needed for wheezing or shortness of breath. 06/01/14   Kathlen Mody, MD  alprazolam Prudy Feeler) 2 MG tablet Take 1 tablet (2 mg total) by mouth 3 (three) times daily as needed for anxiety. 05/07/14   Vassie Loll, MD  diphenhydrAMINE (BENADRYL) 25 MG tablet Take 25-50 mg by mouth every 6 (six) hours as needed for itching.    Historical Provider, MD  ENSURE (ENSURE) Take 237 mLs by mouth 3 (three) times daily between meals.    Historical Provider, MD  gabapentin (NEURONTIN) 100 MG capsule Take 200-300 mg by mouth 3 (three) times daily. 300mg  in the morning, 200mg  in the afternoon and 300mg  at bedtime.    Historical Provider, MD  guaiFENesin-codeine (ROBITUSSIN AC) 100-10 MG/5ML syrup Take 5 mLs by mouth 3 (three) times daily as needed for cough. 06/01/14   Kathlen Mody, MD  mometasone-formoterol (DULERA) 100-5 MCG/ACT AERO Inhale 2 puffs into the lungs 2 (two) times daily. 06/01/14   Kathlen Mody, MD  morphine (MSIR) 30 MG tablet Take 30 mg by mouth every 4 (four) hours as needed for severe pain.    Historical Provider, MD  Multiple Vitamins-Minerals (MULTIVITAMIN WITH MINERALS) tablet Take 1 tablet  by mouth every morning.    Historical Provider, MD  nicotine (NICODERM CQ - DOSED IN MG/24 HOURS) 21 mg/24hr patch Place 1 patch (21 mg total) onto the skin daily. 06/01/14   Kathlen ModyVijaya Akula, MD  oxyCODONE (ROXICODONE) 15 MG immediate release tablet Take 1 tablet (15 mg total) by mouth 3 (three) times daily. 06/02/14   Jacalyn LefevreEunice Thomas, NP  predniSONE (STERAPRED UNI-PAK) 10 MG tablet Take by mouth daily. Prednisone 40 mg daily 3 days followed by Prednisone 20 mg daily for 3 days 06/01/14   Kathlen ModyVijaya Akula, MD  tiotropium (SPIRIVA) 18 MCG inhalation capsule Place 1 capsule (18 mcg total) into inhaler and inhale daily. 06/01/14   Kathlen ModyVijaya  Akula, MD  VERAPAMIL HCL PO Take 1 tablet by mouth every morning.    Historical Provider, MD  zolpidem (AMBIEN) 10 MG tablet Take 10 mg by mouth at bedtime.    Historical Provider, MD   BP 163/83 mmHg  Pulse 94  Resp 18  SpO2 100%  LMP 07/15/2014 Physical Exam  Constitutional: She is oriented to person, place, and time. She appears well-developed and well-nourished. She appears distressed.  HENT:  Head: Normocephalic and atraumatic.  Mouth/Throat: Oropharynx is clear and moist.  Eyes: EOM are normal. Pupils are equal, round, and reactive to light.  Neck: Normal range of motion. Neck supple.  Cardiovascular: Regular rhythm.  Tachycardia present.  Exam reveals no friction rub.   No murmur heard. Pulmonary/Chest: Accessory muscle usage present. Tachypnea noted. She is in respiratory distress (severely labored work of breathing). She has no decreased breath sounds. She has wheezes (extremely coarse, diffuse). She has no rhonchi. She has no rales.  Abdominal: Soft. She exhibits no distension. There is no tenderness. There is no rebound.  Musculoskeletal: Normal range of motion. She exhibits no edema.  Neurological: She is alert and oriented to person, place, and time.  Skin: No rash noted. She is not diaphoretic.  Nursing note and vitals reviewed.   ED Course  INTUBATION Date/Time: 07/25/2014 6:17 PM Performed by: Elwin MochaWALDEN, Doniel Maiello Authorized by: Elwin MochaWALDEN, Irvin Lizama Consent: The procedure was performed in an emergent situation. Indications: respiratory distress Intubation method: video-assisted Patient status: paralyzed (RSI) Preoxygenation: nonrebreather mask Sedatives: ketmine Paralytic: rocuronium Tube size: 7.5 mm Tube type: cuffed Number of attempts: 2 Ventilation between attempts: BVM Cricoid pressure: no Cords visualized: yes Post-procedure assessment: chest rise Breath sounds: equal and absent over the epigastrium Cuff inflated: yes ETT to lip: 24 cm Tube secured with: ETT  holder Chest x-ray findings: endotracheal tube too low Tube repositioned: tube repositioned successfully Patient tolerance: Patient tolerated the procedure well with no immediate complications Comments: Initially attempted awake intubation with ketamine because of marked stridor. No edema, so rocuronium given because unable to pass tub through cords.   (including critical care time) Labs Review Labs Reviewed  CBC  BASIC METABOLIC PANEL  PRO B NATRIURETIC PEPTIDE  BLOOD GAS, VENOUS    Imaging Review Dg Chest Port 1 View  07/25/2014   CLINICAL DATA:  Shortness of breath.  EXAM: PORTABLE CHEST - 1 VIEW  COMPARISON:  May 31, 2014.  FINDINGS: The heart size and mediastinal contours are within normal limits. Both lungs are clear. No pneumothorax or pleural effusion is noted. The visualized skeletal structures are unremarkable.  IMPRESSION: No acute cardiopulmonary abnormality seen.   Electronically Signed   By: Roque LiasJames  Green M.D.   On: 07/25/2014 17:43     EKG Interpretation   Date/Time:  Friday July 25 2014 19:00:06 EST Ventricular  Rate:  145 PR Interval:  133 QRS Duration: 84 QT Interval:  393 QTC Calculation: 610 R Axis:   69 Text Interpretation:  Sinus tachycardia Consider left ventricular  hypertrophy Prolonged QT interval Baseline wander in lead(s) V1 Tachy new,  morphology difficult to delineate due to rate Confirmed by Gwendolyn GrantWALDEN  MD,  Kirra Verga ([id number]) on 07/25/2014 7:03:32 PM     CRITICAL CARE Performed by: Dagmar HaitWALDEN, WILLIAM Verita Kuroda   Total critical care time: 50 minutes  Critical care time was exclusive of separately billable procedures and treating other patients.  Critical care was necessary to treat or prevent imminent or life-threatening deterioration.  Critical care was time spent personally by me on the following activities: development of treatment plan with patient and/or surrogate as well as nursing, discussions with consultants, evaluation of patient's response  to treatment, examination of patient, obtaining history from patient or surrogate, ordering and performing treatments and interventions, ordering and review of laboratory studies, ordering and review of radiographic studies, pulse oximetry and re-evaluation of patient's condition.  MDM   Final diagnoses:  Shortness of breath  Intubation granuloma, initial encounter  Severe persistent asthma with exacerbation  Acute respiratory distress    18F here with severe asthma exacerbation. Had tried 3 nebulizers at home without any relief. Tachypneic, very labored breathing. Audible wheezing through the department.  Hx of severe asthma exacerbations requiring intubation x 3. Initially put on continuous albuterol with 2 grams of Magnesium, 1 gram of ipratropium, steroids without much relief. After about 7-10 minutes, decision made to put patient on BiPap. Ativan ordered to help ease anxiety of using the BiPap machine.  On Bipap, patient had mild panic attack and was asking to take the BiPap machine off. She became tachycardic and restless and wasn't going to tolerate BiPap. I decided to intubate her since she wouldn't tolerate non-invasive therapy.  Intubation note as above.  I spoke with Wife, [phone], informed her of the situation. She informed me she had vocal cord issues from her last intubation, which likely explained her stridor prior to intubation. Admitted to Critical Care. Critical Care asked for her rate to be turned down from 20 to 12. Subsequent ABG shows acidosis - 7.219, pCO2 of 47. Will put rate back at 20.  Admitted to ICU.  Elwin MochaBlair Jesua Tamblyn, MD 07/26/14 [phone]

## 2014-07-25 NOTE — Progress Notes (Signed)
Call from Dr Gwendolyn GrantWalden to eMD  Patient intubated for ae asthma. Currently per Dr Gwendolyn GrantWalden stable   Plan Admit Icu orders written Horton MarshallBedsie PCCM MD to eval patient   Dr. Kalman ShanMurali Aisling Emigh, M.D., [facility]P Pulmonary and Critical Care Medicine Staff Physician [facility] Pager: [phone], If no answer or between  15:00h - 7:00h: call [phone]  07/25/2014 6:33 PM

## 2014-07-25 NOTE — Code Documentation (Signed)
Pt monitored by 3 RN, EDP, RT, Chief Executive OfficerCharge RN and Tech.  Time out completed.

## 2014-07-25 NOTE — ED Notes (Signed)
Venous blood gas result given to Dr. Gwendolyn GrantWalden

## 2014-07-25 NOTE — H&P (Signed)
PULMONARY / CRITICAL CARE MEDICINE HISTORY AND PHYSICAL EXAMINATION   Name: Julie Kerr MRN: [id number] DOB: 02/06/1976    ADMISSION DATE:  07/25/2014  PRIMARY SERVICE: PCCM  CHIEF COMPLAINT:  Dyspnea  BRIEF PATIENT DESCRIPTION: 38 y/o woman w/ hx of asthma who presents with respiratory distress due to significant gap metabolic acidosis  SIGNIFICANT EVENTS / STUDIES:  Intubated in ED  LINES / TUBES: ETT - 07/25/14 OGT - 07/25/14 PIVs x2 - 07/25/14 Foley - 07/25/14  CULTURES: Blood - 07/25/14 (ordered) Urine - 07/25/14 (ordered)  ANTIBIOTICS: None  HISTORY OF PRESENT ILLNESS:   38 y/o woman w/ hx of asthma (s/p intubation x3), tobacco abuse, polysubstance abuse, and chronic pain who presented to the ED with worsening dyspnea that she thought was due to an asthma exacerbation. The patient was reportedly in her usual state of health until mid-day when she felt more dyspnic. This progressed until her partner came home at around 16:45 and she was significantly more dyspneic, and was taken to the ED. She was found to be acidotic, and was placed on BiPAP to help correct this, but she did not tolerate the mask and was intubated. PCCM was called for admission.  Of note, the patient was being treated by a pain clinic (was getting Oxycodone 15 MG one tablet three times a day ) but was recently discharged for a UDS positive for cocaine, benzodiazepines and another narcotic that she was not prescribed.  PAST MEDICAL HISTORY :  Past Medical History  Diagnosis Date  . Asthma   . Anxiety   . COPD (chronic obstructive pulmonary disease)   . Bronchitis   . Heart murmur   . Shortness of breath    Past Surgical History  Procedure Laterality Date  . Spinal fusion     Prior to Admission medications   Medication Sig Start Date End Date Taking? Authorizing Provider  albuterol (PROVENTIL HFA;VENTOLIN HFA) 108 (90 BASE) MCG/ACT inhaler Inhale 2 puffs into the lungs every 4 (four) hours as  needed for wheezing or shortness of breath. 06/01/14   Kathlen ModyVijaya Akula, MD  albuterol (PROVENTIL) (2.5 MG/3ML) 0.083% nebulizer solution Take 3 mLs (2.5 mg total) by nebulization every 4 (four) hours as needed for wheezing or shortness of breath. 06/01/14   Kathlen ModyVijaya Akula, MD  alprazolam Prudy Feeler(XANAX) 2 MG tablet Take 1 tablet (2 mg total) by mouth 3 (three) times daily as needed for anxiety. 05/07/14   Vassie Lollarlos Madera, MD  diphenhydrAMINE (BENADRYL) 25 MG tablet Take 25-50 mg by mouth every 6 (six) hours as needed for itching.    Historical Provider, MD  ENSURE (ENSURE) Take 237 mLs by mouth 3 (three) times daily between meals.    Historical Provider, MD  gabapentin (NEURONTIN) 100 MG capsule Take 200-300 mg by mouth 3 (three) times daily. 300mg  in the morning, 200mg  in the afternoon and 300mg  at bedtime.    Historical Provider, MD  guaiFENesin-codeine (ROBITUSSIN AC) 100-10 MG/5ML syrup Take 5 mLs by mouth 3 (three) times daily as needed for cough. 06/01/14   Kathlen ModyVijaya Akula, MD  mometasone-formoterol (DULERA) 100-5 MCG/ACT AERO Inhale 2 puffs into the lungs 2 (two) times daily. 06/01/14   Kathlen ModyVijaya Akula, MD  morphine (MSIR) 30 MG tablet Take 30 mg by mouth every 4 (four) hours as needed for severe pain.    Historical Provider, MD  Multiple Vitamins-Minerals (MULTIVITAMIN WITH MINERALS) tablet Take 1 tablet by mouth every morning.    Historical Provider, MD  nicotine (NICODERM CQ - DOSED IN MG/24  HOURS) 21 mg/24hr patch Place 1 patch (21 mg total) onto the skin daily. 06/01/14   Kathlen Mody, MD  oxyCODONE (ROXICODONE) 15 MG immediate release tablet Take 1 tablet (15 mg total) by mouth 3 (three) times daily. 06/02/14   Jacalyn Lefevre, NP  predniSONE (STERAPRED UNI-PAK) 10 MG tablet Take by mouth daily. Prednisone 40 mg daily 3 days followed by Prednisone 20 mg daily for 3 days 06/01/14   Kathlen Mody, MD  tiotropium (SPIRIVA) 18 MCG inhalation capsule Place 1 capsule (18 mcg total) into inhaler and inhale daily.  06/01/14   Kathlen Mody, MD  VERAPAMIL HCL PO Take 1 tablet by mouth every morning.    Historical Provider, MD  zolpidem (AMBIEN) 10 MG tablet Take 10 mg by mouth at bedtime.    Historical Provider, MD   Allergies  Allergen Reactions  . Robitussin Dm [Dextromethorphan-Guaifenesin] Nausea And Vomiting  . Nsaids Hives  . Rayon, Purified Hives  . Tramadol Hives    FAMILY HISTORY:  Family History  Problem Relation Age of Onset  . HIV Mother   . Heart disease Father   . CVA Father   . Heart disease Other   . Emphysema Maternal Grandmother     smoked  . Asthma Sister   . Clotting disorder Sister   . Clotting disorder Maternal Grandmother   . Lung cancer Maternal Grandmother     smoked   SOCIAL HISTORY:  reports that she has been smoking Cigarettes.  She has a 34 pack-year smoking history. She has never used smokeless tobacco. She reports that she drinks alcohol. She reports that she does not use illicit drugs.  REVIEW OF SYSTEMS:  Cannot assess 2/2 intubation  SUBJECTIVE:  Agitated in bed  VITAL SIGNS: Temp:  [98.5 F (36.9 C)] 98.5 F (36.9 C) (12/11 2000) Pulse Rate:  [94-169] 146 (12/11 2000) Resp:  [12-36] 25 (12/11 2000) BP: (115-169)/(63-121) 136/71 mmHg (12/11 2000) SpO2:  [98 %-100 %] 98 % (12/11 2000) FiO2 (%):  [40 %] 40 % (12/11 2000) Weight:  [176 lb 12.9 oz (80.2 kg)-182 lb 8.7 oz (82.8 kg)] 182 lb 8.7 oz (82.8 kg) (12/11 1951) HEMODYNAMICS:   VENTILATOR SETTINGS: Vent Mode:  [-] PRVC FiO2 (%):  [40 %] 40 % Set Rate:  [12 bmp-30 bmp] 30 bmp Vt Set:  [500 mL] 500 mL PEEP:  [5 cmH20] 5 cmH20 Plateau Pressure:  [13 cmH20-24 cmH20] 13 cmH20 INTAKE / OUTPUT: Intake/Output    None     PHYSICAL EXAMINATION: General:  Obese woman, agitated in bed Neuro:  Moves all extremities, purposeful movement re: LDAs, but does not follow commands HEENT:  MMM Neck: No JVD Cardiovascular:  Very tachycardic rate, cannot assess for murmurs. Lungs:  CTA Abdomen:   Obese Musculoskeletal:  No deformities Skin:  No rashes  LABS:  CBC  Recent Labs Lab 07/25/14 1718 07/25/14 1844  WBC 8.6 22.6*  HGB 12.2 11.0*  HCT 37.7 34.0*  PLT 320 271   Coag's  Recent Labs Lab 07/25/14 1844  APTT 26  INR 1.17   BMET  Recent Labs Lab 07/25/14 1718 07/25/14 1844  NA 139 138  K 4.0 2.6*  CL 102 103  CO2 20 18*  BUN 8 9  CREATININE 0.72 0.67  GLUCOSE 94 195*   Electrolytes  Recent Labs Lab 07/25/14 1718 07/25/14 1844  CALCIUM 9.7 8.4  MG  --  2.5  PHOS  --  2.2*   Sepsis Markers  Recent Labs Lab 07/25/14 1844  LATICACIDVEN 3.4*  PROCALCITON <0.10   ABG  Recent Labs Lab 07/25/14 1850  PHART 7.219*  PCO2ART 47.2*  PO2ART 180.0*   Liver Enzymes  Recent Labs Lab 07/25/14 1844  AST 11  ALT 10  ALKPHOS 58  BILITOT 0.2*  ALBUMIN 3.4*   Cardiac Enzymes  Recent Labs Lab 07/25/14 1718 07/25/14 1844  TROPONINI  --  <0.30  PROBNP <5.0 <5.0   Glucose  Recent Labs Lab 07/25/14 2007  GLUCAP 231*    Imaging Dg Chest Portable 1 View  07/25/2014   CLINICAL DATA:  38 year old female with shortness of breath, intubated  EXAM: PORTABLE CHEST - 1 VIEW  COMPARISON:  prior chest x-ray earlier today at 17:32 p.m.  FINDINGS: The tip of the endotracheal tube is 1.8 cm above the carina. Cardiac and mediastinal contours remain unchanged. Inspiratory volumes are low were compared to earlier today. Osseous structures are intact and unremarkable. Query developing left basilar opacity.  IMPRESSION: 1. Tip of the endotracheal tube is 1.8 cm above the carina. 2. Developing patchy left basilar opacity which may represent atelectasis or infiltrate.   Electronically Signed   By: Malachy MoanHeath  McCullough M.D.   On: 07/25/2014 18:39   Dg Chest Port 1 View  07/25/2014   CLINICAL DATA:  Shortness of breath.  EXAM: PORTABLE CHEST - 1 VIEW  COMPARISON:  May 31, 2014.  FINDINGS: The heart size and mediastinal contours are within normal limits.  Both lungs are clear. No pneumothorax or pleural effusion is noted. The visualized skeletal structures are unremarkable.  IMPRESSION: No acute cardiopulmonary abnormality seen.   Electronically Signed   By: Roque LiasJames  Green M.D.   On: 07/25/2014 17:43    EKG: Sinus tachycardia CXR: Low lung volumes  ASSESSMENT / PLAN:  Active Problems:   Acute respiratory failure   PULMONARY A: Respiratory Failure requiring mechanical ventilation -  History of Asthma:  P:   Will maintain on mechanical ventilation with high minute ventilation to control metabolic acidosis. Will administer xopenex as patient is quite tachycardic and would like to avoid further beta-agonism. Continue ipratroprium nebs scheduled. Will stop systemic steroids as this may obfuscate clinical picture and physiology is not presently consistent with asthma as ABG shows metabolic acidosis and peak pressures are <19 (w/ 8 cc/kg Vt) with minute ventilation around 15 L/min.  CARDIOVASCULAR A: Sinus Tachycardia P:   Favor agitation vs opiate withdrawal. Now trending down w/ sedation. Albuterol may exacerbate tachycardia, so change to xopenex as above.  RENAL A: Anion gap metabolic acidosis: Hypokalemia P:   No renal failure, no (signifiant) hyperglycemia. LA is moderately elevated; working up other causes of AGMA including toxic ingestion. Will replete K aggressively.  GASTROINTESTINAL A: No acute issues; keep NPO overnight. P:   NTD  HEMATOLOGIC A: Leukocytosis P:   Favor stress and steroid induced. Will monitor  INFECTIOUS A: Metabolic disarray w/ leukocytosis and lactic acidosis - no apparent cause. P:   Sepsis seems unlikely given clinical picture and no apparent source. Cx pending. Holding abx for now.  ENDOCRINE A: Hyperglycemic P:   SSI q4.  NEUROLOGIC A: Metabolic encephelopathy Possible opiate withdrawal Possible toxic ingestion P:   Will monitor, provide fentanyl PRN.   BEST PRACTICE /  DISPOSITION Level of Care:  ICU Primary Service:  PCCM Consultants:  None Code Status:  Full Diet:  NPO DVT Px:  Enoxparin GI Px:  Famotidine Skin Integrity:  intact Social / Family:  Spoke w/ Larissa (gilfriend). Has daughter age > 4418, could  not get ahold of.  TODAY'S SUMMARY: 38 y/o woman with anion gap metabolic acidosis and associated respiratory failure.  I have personally obtained a history, examined the patient, evaluated laboratory and imaging results, formulated the assessment and plan and placed orders.  CRITICAL CARE: The patient is critically ill with multiple organ systems failure and requires high complexity decision making for assessment and support, frequent evaluation and titration of therapies, application of advanced monitoring technologies and extensive interpretation of multiple databases. Critical Care Time devoted to patient care services described in this note is 120 minutes.   Jamie Kato, MD Pulmonary and Critical Care Medicine [facility] Pager: [phone]   07/25/2014, 8:57 PM

## 2014-07-25 NOTE — Code Documentation (Signed)
Family updated as to patient's status.

## 2014-07-25 NOTE — Progress Notes (Signed)
Patient intubated due to asthma exacerbation by Dr. Gwendolyn GrantWalden, with a 7.5 ETT taped at 25@ lip, good color change on end tidal CO2 detector, good BBS ausculted over lung fields, X-Ray confirmed, placed on above vent settings per MD.

## 2014-07-26 ENCOUNTER — Inpatient Hospital Stay (HOSPITAL_COMMUNITY): Payer: Medicaid Other

## 2014-07-26 DIAGNOSIS — T8579XA Infection and inflammatory reaction due to other internal prosthetic devices, implants and grafts, initial encounter: Secondary | ICD-10-CM | POA: Insufficient documentation

## 2014-07-26 DIAGNOSIS — J80 Acute respiratory distress syndrome: Secondary | ICD-10-CM

## 2014-07-26 DIAGNOSIS — Z4659 Encounter for fitting and adjustment of other gastrointestinal appliance and device: Secondary | ICD-10-CM

## 2014-07-26 DIAGNOSIS — J9600 Acute respiratory failure, unspecified whether with hypoxia or hypercapnia: Secondary | ICD-10-CM

## 2014-07-26 DIAGNOSIS — R0603 Acute respiratory distress: Secondary | ICD-10-CM | POA: Insufficient documentation

## 2014-07-26 LAB — CBC
HCT: 31.5 % — ABNORMAL LOW (ref 36.0–46.0)
HEMATOCRIT: 33.5 % — AB (ref 36.0–46.0)
Hemoglobin: 10.6 g/dL — ABNORMAL LOW (ref 12.0–15.0)
Hemoglobin: 10 g/dL — ABNORMAL LOW (ref 12.0–15.0)
MCH: 25.2 pg — ABNORMAL LOW (ref 26.0–34.0)
MCH: 25.3 pg — AB (ref 26.0–34.0)
MCHC: 31.6 g/dL (ref 30.0–36.0)
MCHC: 31.7 g/dL (ref 30.0–36.0)
MCV: 79.5 fL (ref 78.0–100.0)
MCV: 79.8 fL (ref 78.0–100.0)
Platelets: 282 10*3/uL (ref 150–400)
Platelets: 268 10*3/uL (ref 150–400)
RBC: 3.96 MIL/uL (ref 3.87–5.11)
RBC: 4.2 MIL/uL (ref 3.87–5.11)
RDW: 15 % (ref 11.5–15.5)
RDW: 15 % (ref 11.5–15.5)
WBC: 18.1 10*3/uL — ABNORMAL HIGH (ref 4.0–10.5)
WBC: 16.6 10*3/uL — ABNORMAL HIGH (ref 4.0–10.5)

## 2014-07-26 LAB — GLUCOSE, CAPILLARY
GLUCOSE-CAPILLARY: 125 mg/dL — AB (ref 70–99)
GLUCOSE-CAPILLARY: 112 mg/dL — AB (ref 70–99)
Glucose-Capillary: 124 mg/dL — ABNORMAL HIGH (ref 70–99)
Glucose-Capillary: 146 mg/dL — ABNORMAL HIGH (ref 70–99)
Glucose-Capillary: 179 mg/dL — ABNORMAL HIGH (ref 70–99)
Glucose-Capillary: 235 mg/dL — ABNORMAL HIGH (ref 70–99)

## 2014-07-26 LAB — POCT I-STAT 3, ART BLOOD GAS (G3+)
ACID-BASE DEFICIT: 16 mmol/L — AB (ref 0.0–2.0)
ACID-BASE DEFICIT: 9 mmol/L — AB (ref 0.0–2.0)
Acid-base deficit: 3 mmol/L — ABNORMAL HIGH (ref 0.0–2.0)
Bicarbonate: 10.4 mEq/L — ABNORMAL LOW (ref 20.0–24.0)
Bicarbonate: 14.8 mEq/L — ABNORMAL LOW (ref 20.0–24.0)
Bicarbonate: 21.3 mEq/L (ref 20.0–24.0)
O2 SAT: 89 %
O2 Saturation: 99 %
O2 Saturation: 100 %
PCO2 ART: 26 mmHg — AB (ref 35.0–45.0)
PCO2 ART: 36.4 mmHg (ref 35.0–45.0)
PH ART: 7.378 (ref 7.350–7.450)
PO2 ART: 70 mmHg — AB (ref 80.0–100.0)
PO2 ART: 173 mmHg — AB (ref 80.0–100.0)
Patient temperature: 99.8
Patient temperature: 99.5
Patient temperature: 99.4
TCO2: 11 mmol/L (ref 0–100)
TCO2: 16 mmol/L (ref 0–100)
TCO2: 22 mmol/L (ref 0–100)
pCO2 arterial: 27.8 mmHg — ABNORMAL LOW (ref 35.0–45.0)
pH, Arterial: 7.187 — CL (ref 7.350–7.450)
pH, Arterial: 7.365 (ref 7.350–7.450)
pO2, Arterial: 152 mmHg — ABNORMAL HIGH (ref 80.0–100.0)

## 2014-07-26 LAB — BASIC METABOLIC PANEL
ANION GAP: 22 — AB (ref 5–15)
Anion gap: 12 (ref 5–15)
Anion gap: 11 (ref 5–15)
BUN: 8 mg/dL (ref 6–23)
BUN: 6 mg/dL (ref 6–23)
BUN: 4 mg/dL — AB (ref 6–23)
CALCIUM: 8.6 mg/dL (ref 8.4–10.5)
CHLORIDE: 107 meq/L (ref 96–112)
CHLORIDE: 103 meq/L (ref 96–112)
CO2: 10 mEq/L — CL (ref 19–32)
CO2: 22 mEq/L (ref 19–32)
CO2: 25 meq/L (ref 19–32)
CREATININE: 0.83 mg/dL (ref 0.50–1.10)
Calcium: 8.4 mg/dL (ref 8.4–10.5)
Calcium: 8.7 mg/dL (ref 8.4–10.5)
Chloride: 109 mEq/L (ref 96–112)
Creatinine, Ser: 0.64 mg/dL (ref 0.50–1.10)
Creatinine, Ser: 0.62 mg/dL (ref 0.50–1.10)
GFR calc Af Amer: >90 mL/min (ref >90)
GFR calc Af Amer: >90 mL/min (ref >90)
GFR calc Af Amer: >90 mL/min (ref >90)
GFR calc non Af Amer: 90 mL/min — ABNORMAL LOW (ref >90)
GFR calc non Af Amer: >90 mL/min (ref >90)
GFR calc non Af Amer: >90 mL/min (ref >90)
GLUCOSE: 108 mg/dL — AB (ref 70–99)
GLUCOSE: 124 mg/dL — AB (ref 70–99)
Glucose, Bld: 195 mg/dL — ABNORMAL HIGH (ref 70–99)
POTASSIUM: 3.3 meq/L — AB (ref 3.7–5.3)
Potassium: 4 mEq/L (ref 3.7–5.3)
Potassium: 3.3 mEq/L — ABNORMAL LOW (ref 3.7–5.3)
Sodium: 141 mEq/L (ref 137–147)
Sodium: 141 mEq/L (ref 137–147)
Sodium: 139 mEq/L (ref 137–147)

## 2014-07-26 LAB — COMPREHENSIVE METABOLIC PANEL
ALK PHOS: 55 U/L (ref 39–117)
ALT: 11 U/L (ref 0–35)
AST: 15 U/L (ref 0–37)
Albumin: 3.2 g/dL — ABNORMAL LOW (ref 3.5–5.2)
Anion gap: 22 — ABNORMAL HIGH (ref 5–15)
BUN: 8 mg/dL (ref 6–23)
CO2: 10 meq/L — AB (ref 19–32)
Calcium: 8.6 mg/dL (ref 8.4–10.5)
Chloride: 108 mEq/L (ref 96–112)
Creatinine, Ser: 0.77 mg/dL (ref 0.50–1.10)
GLUCOSE: 198 mg/dL — AB (ref 70–99)
POTASSIUM: 4.1 meq/L (ref 3.7–5.3)
Sodium: 140 mEq/L (ref 137–147)
Total Protein: 6.8 g/dL (ref 6.0–8.3)

## 2014-07-26 LAB — URINALYSIS W MICROSCOPIC (NOT AT ARMC)
BILIRUBIN URINE: NEGATIVE
GLUCOSE, UA: NEGATIVE mg/dL
Hgb urine dipstick: NEGATIVE
KETONES UR: NEGATIVE mg/dL
Leukocytes, UA: NEGATIVE
Nitrite: NEGATIVE
Protein, ur: NEGATIVE mg/dL
Specific Gravity, Urine: 1.02 (ref 1.005–1.030)
Urobilinogen, UA: 0.2 mg/dL (ref 0.0–1.0)
pH: 5.5 (ref 5.0–8.0)

## 2014-07-26 LAB — TROPONIN I
Troponin I: <0.3 ng/mL (ref <0.30)
Troponin I: <0.3 ng/mL (ref <0.30)

## 2014-07-26 LAB — SALICYLATE LEVEL: Salicylate Lvl: <2 mg/dL — ABNORMAL LOW (ref 2.8–20.0)

## 2014-07-26 LAB — CREATININE, SERUM
Creatinine, Ser: 0.83 mg/dL (ref 0.50–1.10)
GFR, EST NON AFRICAN AMERICAN: 90 mL/min — AB (ref >90)

## 2014-07-26 LAB — ACETAMINOPHEN LEVEL: Acetaminophen (Tylenol), Serum: <15 ug/mL (ref 10–30)

## 2014-07-26 LAB — RAPID URINE DRUG SCREEN, HOSP PERFORMED
Amphetamines: NOT DETECTED
Barbiturates: NOT DETECTED
Benzodiazepines: POSITIVE — AB
Cocaine: POSITIVE — AB
OPIATES: NOT DETECTED
Tetrahydrocannabinol: NOT DETECTED

## 2014-07-26 LAB — VOLATILES,BLD-ACETONE,ETHANOL,ISOPROP,METHANOL
ACETONE, BLOOD: NEGATIVE
Ethanol, blood: NEGATIVE
ISOPROPANOL, BLOOD: NEGATIVE
Methanol, blood: NEGATIVE

## 2014-07-26 LAB — LACTIC ACID, PLASMA
LACTIC ACID, VENOUS: 1.4 mmol/L (ref 0.5–2.2)
Lactic Acid, Venous: 1.5 mmol/L (ref 0.5–2.2)

## 2014-07-26 LAB — CORTISOL: Cortisol, Plasma: 39.3 ug/dL

## 2014-07-26 LAB — PHOSPHORUS: Phosphorus: 1.9 mg/dL — ABNORMAL LOW (ref 2.3–4.6)

## 2014-07-26 LAB — LACTATE DEHYDROGENASE: LDH: 121 U/L (ref 94–250)

## 2014-07-26 LAB — CK: Total CK: 37 U/L (ref 7–177)

## 2014-07-26 LAB — MAGNESIUM: Magnesium: 2 mg/dL (ref 1.5–2.5)

## 2014-07-26 LAB — OSMOLALITY: Osmolality: 292 mOsm/kg (ref 275–300)

## 2014-07-26 LAB — PREGNANCY, URINE: PREG TEST UR: NEGATIVE

## 2014-07-26 MED ORDER — FENTANYL CITRATE 0.05 MG/ML IJ SOLN
25.0000 ug | INTRAMUSCULAR | Status: DC
  Administered 2014-07-26 – 2014-07-27 (×8): 25 ug via INTRAVENOUS
  Filled 2014-07-26 (×7): qty 2

## 2014-07-26 MED ORDER — SODIUM CHLORIDE 0.9 % IV SOLN
0.0000 mg/h | INTRAVENOUS | Status: DC
  Filled 2014-07-26: qty 10

## 2014-07-26 MED ORDER — WHITE PETROLATUM GEL
Status: AC
  Filled 2014-07-26: qty 5

## 2014-07-26 MED ORDER — CETYLPYRIDINIUM CHLORIDE 0.05 % MT LIQD
7.0000 mL | Freq: Two times a day (BID) | OROMUCOSAL | Status: DC
  Administered 2014-07-26 – 2014-07-29 (×4): 7 mL via OROMUCOSAL

## 2014-07-26 MED ORDER — SODIUM CHLORIDE 0.9 % IV SOLN
25.0000 ug/h | INTRAVENOUS | Status: DC
  Administered 2014-07-26: 150 ug/h via INTRAVENOUS
  Filled 2014-07-26: qty 50

## 2014-07-26 MED ORDER — FENTANYL CITRATE 0.05 MG/ML IJ SOLN
INTRAMUSCULAR | Status: AC
  Filled 2014-07-26: qty 2

## 2014-07-26 MED ORDER — MIDAZOLAM BOLUS VIA INFUSION
1.0000 mg | INTRAVENOUS | Status: DC | PRN
  Filled 2014-07-26: qty 2

## 2014-07-26 MED ORDER — SODIUM CHLORIDE 0.9 % IV BOLUS (SEPSIS)
2000.0000 mL | Freq: Once | INTRAVENOUS | Status: AC
  Administered 2014-07-26: 2000 mL via INTRAVENOUS

## 2014-07-26 MED ORDER — SODIUM BICARBONATE 8.4 % IV SOLN
INTRAVENOUS | Status: DC
  Administered 2014-07-26 – 2014-07-27 (×4): via INTRAVENOUS
  Filled 2014-07-26 (×7): qty 150

## 2014-07-26 MED ORDER — FENTANYL BOLUS VIA INFUSION
50.0000 ug | INTRAVENOUS | Status: DC | PRN
  Administered 2014-07-26 (×5): 50 ug via INTRAVENOUS
  Filled 2014-07-26: qty 50

## 2014-07-26 MED ORDER — PHENOL 1.4 % MT LIQD
1.0000 | OROMUCOSAL | Status: DC | PRN
  Administered 2014-07-26 – 2014-08-03 (×5): 1 via OROMUCOSAL
  Filled 2014-07-26 (×4): qty 177

## 2014-07-26 NOTE — Progress Notes (Signed)
PULMONARY / CRITICAL CARE MEDICINE    Name: Julie Kerr MRN: [id number] DOB: 03-15-1976    ADMISSION DATE:  07/25/2014  PRIMARY SERVICE: PCCM  CHIEF COMPLAINT:  Dyspnea  BRIEF PATIENT DESCRIPTION: 38 y/o woman w/ hx of asthma who presents with respiratory distress due to significant gap metabolic acidosis  SIGNIFICANT EVENTS / STUDIES:  Intubated in ED 12/12- awake, weaning  SUBJECTIVE:  NAD on vent, PS 8/5, writing notes.  C/o chest pain.   VITAL SIGNS: Temp:  [98.5 F (36.9 C)-99.8 F (37.7 C)] 99.1 F (37.3 C) (12/12 0725) Pulse Rate:  [94-169] 101 (12/12 0731) Resp:  [12-36] 20 (12/12 0731) BP: (95-169)/(39-121) 95/45 mmHg (12/12 0731) SpO2:  [98 %-100 %] 100 % (12/12 0731) FiO2 (%):  [40 %] 40 % (12/12 0731) Weight:  [176 lb 12.9 oz (80.2 kg)-182 lb 8.7 oz (82.8 kg)] 182 lb 8.7 oz (82.8 kg) (12/12 0500) HEMODYNAMICS:   VENTILATOR SETTINGS: Vent Mode:  [-] CPAP;PSV FiO2 (%):  [40 %] 40 % Set Rate:  [12 bmp-30 bmp] 30 bmp Vt Set:  [500 mL] 500 mL PEEP:  [5 cmH20] 5 cmH20 Pressure Support:  [8 cmH20] 8 cmH20 Plateau Pressure:  [13 cmH20-24 cmH20] 17 cmH20 INTAKE / OUTPUT: Intake/Output      12/11 0701 - 12/12 0700 12/12 0701 - 12/13 0700   I.V. (mL/kg) 1318.4 (15.9)    IV Piggyback 2000    Total Intake(mL/kg) 3318.4 (40.1)    Urine (mL/kg/hr) 910 75 (0.4)   Total Output 910 75   Net +2408.4 -75          PHYSICAL EXAMINATION: General:  Obese woman, NAD writing notes on vent  Neuro:  Moves all extremities, follows commands, writing notes HEENT:  MMM, ETT, no JVD  Cardiovascular:  s1s2 tachy 120's Lungs:  resps even non labored on PS 8/5, essentially clear  Abdomen:  Obese Musculoskeletal:  No deformities, no edema   LABS:  CBC  Recent Labs Lab 07/25/14 1844 07/26/14 0050 07/26/14 0257  WBC 22.6* 18.1* 16.6*  HGB 11.0* 10.6* 10.0*  HCT 34.0* 33.5* 31.5*  PLT 271 282 268   Coag's  Recent Labs Lab 07/25/14 1844  APTT 26  INR 1.17    BMET  Recent Labs Lab 07/25/14 1718 07/25/14 1844 07/26/14 0050 07/26/14 0257  NA 139 138  --  140  141  K 4.0 2.6*  --  4.1  4.0  CL 102 103  --  108  109  CO2 20 18*  --  10*  10*  BUN 8 9  --  8  8  CREATININE 0.72 0.67 0.83 0.77  0.83  GLUCOSE 94 195*  --  198*  195*   Electrolytes  Recent Labs Lab 07/25/14 1718 07/25/14 1844 07/26/14 0257  CALCIUM 9.7 8.4 8.6  8.6  MG  --  2.5 2.0  PHOS  --  2.2* 1.9*   Sepsis Markers  Recent Labs Lab 07/25/14 1844 07/25/14 2032  LATICACIDVEN 3.4* 5.5*  PROCALCITON <0.10  --    ABG  Recent Labs Lab 07/25/14 2207 07/26/14 0238 07/26/14 0637  PHART 7.254* 7.187* 7.365  PCO2ART 32.0* 27.8* 26.0*  PO2ART 109.0* 70.0* 152.0*   Liver Enzymes  Recent Labs Lab 07/25/14 1844 07/26/14 0257  AST 11 15  ALT 10 11  ALKPHOS 58 55  BILITOT 0.2* <0.2*  ALBUMIN 3.4* 3.2*   Cardiac Enzymes  Recent Labs Lab 07/25/14 1718 07/25/14 1844 07/26/14 0050 07/26/14 0257  TROPONINI  --  <  0.30 <0.30 <0.30  PROBNP <5.0 <5.0  --   --    Glucose  Recent Labs Lab 07/25/14 2007 07/25/14 2331 07/26/14 0350 07/26/14 0707  GLUCAP 231* 235* 179* 146*    Imaging Dg Chest Port 1 View  07/26/2014   CLINICAL DATA:  Acute respiratory failure. On ventilator.  Asthma.  EXAM: PORTABLE CHEST - 1 VIEW  COMPARISON:  07/25/2014  FINDINGS: New nasogastric tube is seen entering the stomach. Endotracheal tube remains in appropriate position. Both lungs are clear. No evidence of pneumothorax pleural effusion. Heart size is normal.  IMPRESSION: Endotracheal tube and nasogastric tube in appropriate position. No active lung disease.   Electronically Signed   By: Earle Gell M.D.   On: 07/26/2014 07:45   Dg Chest Portable 1 View  07/25/2014   CLINICAL DATA:  38 year old female with shortness of breath, intubated  EXAM: PORTABLE CHEST - 1 VIEW  COMPARISON:  prior chest x-ray earlier today at 17:32 p.m.  FINDINGS: The tip of the  endotracheal tube is 1.8 cm above the carina. Cardiac and mediastinal contours remain unchanged. Inspiratory volumes are low were compared to earlier today. Osseous structures are intact and unremarkable. Query developing left basilar opacity.  IMPRESSION: 1. Tip of the endotracheal tube is 1.8 cm above the carina. 2. Developing patchy left basilar opacity which may represent atelectasis or infiltrate.   Electronically Signed   By: Jacqulynn Cadet M.D.   On: 07/25/2014 18:39   Dg Chest Port 1 View  07/25/2014   CLINICAL DATA:  Shortness of breath.  EXAM: PORTABLE CHEST - 1 VIEW  COMPARISON:  May 31, 2014.  FINDINGS: The heart size and mediastinal contours are within normal limits. Both lungs are clear. No pneumothorax or pleural effusion is noted. The visualized skeletal structures are unremarkable.  IMPRESSION: No acute cardiopulmonary abnormality seen.   Electronically Signed   By: Sabino Dick M.D.   On: 07/25/2014 17:43   Dg Abd Portable 1v  07/25/2014   CLINICAL DATA:  OG tube placement  EXAM: PORTABLE ABDOMEN - 1 VIEW  COMPARISON:  Lumbar spine 02/11/2014  FINDINGS: Enteric tube tip is in the upper mid abdomen consistent with location in the distal stomach. Bowel gas pattern is unremarkable. Postoperative changes in the lumbosacral spine.  IMPRESSION: Enteric tube tip projects over the distal stomach.   Electronically Signed   By: Lucienne Capers M.D.   On: 07/25/2014 21:10    EKG: Sinus tachycardia CXR: Low lung volumes, no infiltrate, ett wnl  ASSESSMENT / PLAN:  Active Problems:   Acute respiratory failure  PULMONARY ETT 12/11>>> Acute VDRF - r/t unexplained metabolic acidosis and ?asthma exacerbation although no sig bronchospasm noted. Tolerating SBT, awake, alert.  CXR clear.  Hx asthma   P:   Cont BD's  Cont PS wean  Likely extubate, weaning cpap 5 ps 5, goal 1 hr, abg, goal reduction PS to 5 supplemental O2 as needed  ?etiology of metabolic acidosis - see below  F/u  CXR  Cont to hold steroids for now with no sig bronchospasm abg reviewed on rest also, keep same mV See renal She is young and likely can compensate well on own  CARDIOVASCULAR A: Sinus Tachycardia - ?agitation v opiate withdrawal.  Troponins negative, EKG ok.  BNP neg.  QTC 610 noted 12/11 ECG P:   Cont xopenex  Avoid BB  Repeat ecg now for qtc  RENAL A: Anion gap metabolic acidosis - ?etiology.  No renal failure, no sig hyperglycemia.  Compensated.  NONAG , AG , small met alk Hypokalemia resolved P:   ecg repeat for qtc F/u chem q8h Check ethanol, methanol,acetone Asa, tylenol levels  Send osmolality then calculate osm gap - NO GAP noted 292-294 (however dont have osm gap on admission and could have exposure now broken down to metabolites giving AG) consider small amount bicarb for NONAG Impression is a tox we have not and may not identify, will discuss with pt and sig aother  GASTROINTESTINAL A: No acute issues; keep NPO overnight. P:   PPI  T f if not extubated  HEMATOLOGIC A: Leukocytosis - Favor stress and steroid induced. P:   F/u cbc  Lovenox, crt in am   INFECTIOUS A: Metabolic disarray w/ leukocytosis and lactic acidosis - no apparent cause. Sepsis seems unlikely given clinical picture and no apparent source. P:   BCx2 12/11>>> Urine 12/11>>> Cont hold abx for now   ENDOCRINE A: Hyperglycemic P:   SSI q4.  NEUROLOGIC A: Metabolic encephelopathy- resolved Possible opiate withdrawal Possible toxic ingestion P:   RASS goal= 0 'dc versed  family available 12/12. Sig other  Nickolas Madrid, NP 07/26/2014  9:09 AM Pager: [phone] or [phone]   STAFF NOTE: I, Merrie Roof, MD FACP have personally reviewed patient's available data, including medical history, events of note, physical examination and test results as part of my evaluation. I have discussed with resident/NP and other care providers such as pharmacist, RN and RRT.  In addition, I personally evaluated patient and elicited key findings NO:BSJGG, cooperative, AG, NONAG , met alk noted, continued bicarb, abg on wean , bmet q8h, repeat tox, send preggy test, no abx needed, no OSM gap, tox additional stuff The patient is critically ill with multiple organ systems failure and requires high complexity decision making for assessment and support, frequent evaluation and titration of therapies, application of advanced monitoring technologies and extensive interpretation of multiple databases.   Critical Care Time devoted to patient care services described in this note is30 Minutes. This time reflects time of care of this signee: Merrie Roof, MD FACP. This critical care time does not reflect procedure time, or teaching time or supervisory time of PA/NP/Med student/Med Resident etc but could involve care discussion time. Rest per NP/medical resident whose note is outlined above and that I agree with   Lavon Paganini. Titus Mould, MD, [place] Pgr: [facility] 07/26/2014 11:13 AM

## 2014-07-26 NOTE — Progress Notes (Signed)
eLink Physician-Brief Progress Note Patient Name: Julie Kerr DOB: 02/24/1976 MRN: [id number]   Date of Service  07/26/2014  HPI/Events of Note  Progressive metabolic acidosis.  Unclear cause.  eICU Interventions  Will add HCO3 to IV fluid.    ?propofol related infusion syndrome >> will change sedation to versed/fentanyl and d/c propofol.      Intervention Category Major Interventions: Other:  Ariya Bohannon 07/26/2014, 2:51 AM

## 2014-07-26 NOTE — Progress Notes (Signed)
75mL of Fentanyl GTT wasted with Layne BentonJulian Beabraut, RN.

## 2014-07-26 NOTE — Procedures (Signed)
Extubation Procedure Note  Patient Details:   Name: Adele Schildermanda Domangue DOB: 04/13/1976 MRN: [id number]   Airway Documentation:     Evaluation  O2 sats: stable throughout Complications: No apparent complications Patient did tolerate procedure well. Bilateral Breath Sounds: Clear, Diminished Suctioning: Airway Yes   Patient extubated to 4lnc. Vital signs stable. No complications. Patient tolerated well. RN and family at bedside. RT will continue to monitor.  Ave Filterdkins, Carmon Brigandi Williams 07/26/2014, 11:55 AM

## 2014-07-26 NOTE — Progress Notes (Signed)
INITIAL NUTRITION ASSESSMENT  DOCUMENTATION CODES Per approved criteria  -Obesity Unspecified   INTERVENTION: -As warranted: -Initiate TF via OGT with Vital High Protein at 25 ml/h and Prostat 30 ml BID on day 1; on day 2,  increase to goal rate of 40 ml/h (960 ml per day) to provide 1160 kcals, 114 gm protein, 803 ml free water daily. -Recommend to replete phosphorus and potassium -RD to continue to monitor  NUTRITION DIAGNOSIS: Inadequate oral intake related to inability to eat as evidenced by NPO status.   Goal: Enteral nutrition to provide 60-70% of estimated calorie needs (22-25 kcals/kg ideal body weight) and 100% of estimated protein needs, based on ASPEN guidelines for hypocaloric, high protein feeding in critically ill obese individuals   Monitor:  TF order/TF tolerance, diet order, total protein/energy intake, labs, weights  Reason for Assessment: Consult for assessment/TF recommendations  38 y.o. female  Admitting Dx: <principal problem not specified>  ASSESSMENT: 38 y/o woman w/ hx of asthma who presents with respiratory distress due to significant gap metabolic acidosis  -Pt with hx of COPD and polysubstance abuse, both of which likely contributed to sub-optimal nutritional intake -Pt was supplemented with Ensure Complete TID per previous medication list -Previous medical records indicate pt with stable weight around 180 lb, currently +2 L, which may be affecting current weight -RD received consult for TF recommendations. Will place to use as warranted -Pt with low K/Phos, K repleted and now WNL. Phos remains low, recommend to replace upon initiation of TF -Elevated CBGs, D5W likely contributing, no hx of DM -Patient is currently intubated on ventilator support MV: 6.2 L/min Temp (24hrs), Avg:99.2 F (37.3 C), Min:98.5 F (36.9 C), Max:99.8 F (37.7 C)  Propofol: 0 ml/hr, was d/c'd and pt now on fentanyl for sedation    Height: Ht Readings from Last 1  Encounters:  07/25/14 5\' 3"  (1.6 m)    Weight: Wt Readings from Last 1 Encounters:  07/26/14 182 lb 8.7 oz (82.8 kg)    Ideal Body Weight: 115 lb  % Ideal Body Weight: 158%  Wt Readings from Last 10 Encounters:  07/26/14 182 lb 8.7 oz (82.8 kg)  06/02/14 176 lb 12.8 oz (80.196 kg)  05/31/14 179 lb 0.2 oz (81.2 kg)  05/09/14 183 lb 9.6 oz (83.28 kg)  05/07/14 181 lb 3.5 oz (82.2 kg)  04/14/14 185 lb 13.6 oz (84.3 kg)  02/24/14 186 lb (84.369 kg)  01/19/14 190 lb 11.2 oz (86.5 kg)  12/04/13 190 lb 12.8 oz (86.546 kg)  11/26/13 185 lb (83.915 kg)    Usual Body Weight: ~180 lb per previous medical records  % Usual Body Weight: 100%  BMI:  Body mass index is 32.34 kg/(m^2).  Estimated Nutritional Needs: Kcal: 1703 (1000-1200 kcal for 60-70%) Protein: >/= 105 gram daily Fluid: >/= 1700 ml daily  Skin: WDL  Diet Order: Diet NPO time specified  EDUCATION NEEDS: -No education needs identified at this time   Intake/Output Summary (Last 24 hours) at 07/26/14 0824 Last data filed at 07/26/14 0800  Gross per 24 hour  Intake 3318.41 ml  Output    985 ml  Net 2333.41 ml    Last BM: PTA   Labs:   Recent Labs Lab 07/25/14 1718 07/25/14 1844 07/26/14 0050 07/26/14 0257  NA 139 138  --  140  141  K 4.0 2.6*  --  4.1  4.0  CL 102 103  --  108  109  CO2 20 18*  --  10*  10*  BUN 8 9  --  8  8  CREATININE 0.72 0.67 0.83 0.77  0.83  CALCIUM 9.7 8.4  --  8.6  8.6  MG  --  2.5  --  2.0  PHOS  --  2.2*  --  1.9*  GLUCOSE 94 195*  --  198*  195*    CBG (last 3)   Recent Labs  07/25/14 2331 07/26/14 0350 07/26/14 0707  GLUCAP 235* 179* 146*    Scheduled Meds: . antiseptic oral rinse  7 mL Mouth Rinse QID  . chlorhexidine  15 mL Mouth Rinse BID  . enoxaparin (LOVENOX) injection  40 mg Subcutaneous Q24H  . insulin aspart  0-20 Units Subcutaneous 6 times per day  . ipratropium  0.5 mg Nebulization Q6H  . levalbuterol  0.63 mg Nebulization Q6H  .  pantoprazole (PROTONIX) IV  40 mg Intravenous QHS    Continuous Infusions: . dextrose 5 % and 0.45% NaCl 50 mL/hr at 07/25/14 2151  . fentaNYL infusion INTRAVENOUS 150 mcg/hr (07/26/14 0812)  . midazolam (VERSED) infusion Stopped (07/26/14 0300)  .  sodium bicarbonate  infusion 1000 mL 125 mL/hr at 07/26/14 [id number]    Past Medical History  Diagnosis Date  . Asthma   . Anxiety   . COPD (chronic obstructive pulmonary disease)   . Bronchitis   . Heart murmur   . Shortness of breath     Past Surgical History  Procedure Laterality Date  . Spinal fusion      Lloyd HugerSarah F Osvaldo Lamping MS RD LDN Clinical Dietitian Pager:[phone]

## 2014-07-27 ENCOUNTER — Inpatient Hospital Stay (HOSPITAL_COMMUNITY): Payer: Medicaid Other

## 2014-07-27 DIAGNOSIS — I517 Cardiomegaly: Secondary | ICD-10-CM

## 2014-07-27 LAB — BASIC METABOLIC PANEL
Anion gap: 12 (ref 5–15)
BUN: 3 mg/dL — ABNORMAL LOW (ref 6–23)
CHLORIDE: 103 meq/L (ref 96–112)
CO2: 27 mEq/L (ref 19–32)
Calcium: 8.6 mg/dL (ref 8.4–10.5)
Creatinine, Ser: 0.67 mg/dL (ref 0.50–1.10)
Glucose, Bld: 132 mg/dL — ABNORMAL HIGH (ref 70–99)
POTASSIUM: 3.3 meq/L — AB (ref 3.7–5.3)
SODIUM: 142 meq/L (ref 137–147)

## 2014-07-27 LAB — GLUCOSE, CAPILLARY
GLUCOSE-CAPILLARY: 120 mg/dL — AB (ref 70–99)
Glucose-Capillary: 147 mg/dL — ABNORMAL HIGH (ref 70–99)
Glucose-Capillary: 85 mg/dL (ref 70–99)
Glucose-Capillary: 96 mg/dL (ref 70–99)
Glucose-Capillary: 118 mg/dL — ABNORMAL HIGH (ref 70–99)

## 2014-07-27 LAB — CBC
HCT: 29 % — ABNORMAL LOW (ref 36.0–46.0)
Hemoglobin: 9.3 g/dL — ABNORMAL LOW (ref 12.0–15.0)
MCH: 25.3 pg — ABNORMAL LOW (ref 26.0–34.0)
MCHC: 32.1 g/dL (ref 30.0–36.0)
MCV: 78.8 fL (ref 78.0–100.0)
PLATELETS: 255 10*3/uL (ref 150–400)
RBC: 3.68 MIL/uL — ABNORMAL LOW (ref 3.87–5.11)
RDW: 14.9 % (ref 11.5–15.5)
WBC: 14.5 10*3/uL — AB (ref 4.0–10.5)

## 2014-07-27 LAB — URINE CULTURE
COLONY COUNT: NO GROWTH
Culture: NO GROWTH

## 2014-07-27 MED ORDER — FENTANYL CITRATE 0.05 MG/ML IJ SOLN
25.0000 ug | INTRAMUSCULAR | Status: DC | PRN
  Administered 2014-07-27 – 2014-07-31 (×22): 25 ug via INTRAVENOUS
  Filled 2014-07-27 (×23): qty 2

## 2014-07-27 MED ORDER — ACETAMINOPHEN 325 MG PO TABS
650.0000 mg | ORAL_TABLET | Freq: Four times a day (QID) | ORAL | Status: DC | PRN
  Administered 2014-07-27 – 2014-08-05 (×2): 650 mg via ORAL
  Filled 2014-07-27 (×2): qty 2

## 2014-07-27 MED ORDER — POTASSIUM CHLORIDE 10 MEQ/100ML IV SOLN
10.0000 meq | INTRAVENOUS | Status: AC
  Administered 2014-07-27 (×2): 10 meq via INTRAVENOUS
  Filled 2014-07-27 (×2): qty 100

## 2014-07-27 MED ORDER — ZOLPIDEM TARTRATE 5 MG PO TABS
5.0000 mg | ORAL_TABLET | Freq: Once | ORAL | Status: AC
  Administered 2014-07-27: 5 mg via ORAL
  Filled 2014-07-27: qty 1

## 2014-07-27 MED ORDER — ALPRAZOLAM 0.5 MG PO TABS
1.0000 mg | ORAL_TABLET | Freq: Once | ORAL | Status: AC
  Administered 2014-07-27: 1 mg via ORAL
  Filled 2014-07-27: qty 2

## 2014-07-27 MED ORDER — FENTANYL 50 MCG/HR TD PT72
50.0000 ug | MEDICATED_PATCH | TRANSDERMAL | Status: DC
  Administered 2014-07-27 – 2014-07-30 (×2): 50 ug via TRANSDERMAL
  Filled 2014-07-27 (×2): qty 1

## 2014-07-27 NOTE — Progress Notes (Signed)
[facility] PROTOCOL FOR AM LAB REPLACEMENT ONLY  The patient does apply [facility] the [facility] Electrolyte [facility] Protocol based on the criteria listed below:   1. Is GFR >/= 40 ml/min? Yes.    Patient's GFR today is >90 2. Is urine output >/= 0.5 ml/kg/hr for the last 6 hours? Yes.   Patient's UOP is 1.9 ml/kg/hr 3. Is BUN < 60 mg/dL? Yes.    Patient's BUN today is 3 4. Abnormal electrolyte(s): K+3.3 5. Ordered repletion with: protocol 6. If a panic level lab has been reported, has the CCM MD in charge been notified? Yes.  .   Physician:  Idalia NeedleByrum  Rocket Gunderson Hilliard 07/27/2014 4:53 AM

## 2014-07-27 NOTE — Evaluation (Signed)
Clinical/Bedside Swallow Evaluation Patient Details  Name: Julie Kerr MRN: [id number] Date of Birth: 06/09/1976  Today's Date: 07/27/2014 Time: [id number] SLP Time Calculation (min) (ACUTE ONLY): 21 min  Past Medical History:  Past Medical History  Diagnosis Date  . Asthma   . Anxiety   . COPD (chronic obstructive pulmonary disease)   . Bronchitis   . Heart murmur   . Shortness of breath    Past Surgical History:  Past Surgical History  Procedure Laterality Date  . Spinal fusion     HPI:  38 [facility] requiring intuation in ED.  PMH + for obesity, COPD, polysubstance abuse, spinal fusion in lower back.  Pt was extubated 12/12 and failed a swallow screen, therefore BSE ordered.     Assessment / Plan / Recommendation Clinical Impression  Pt presents with no overt indications of airway compromise or aspiration with po intake observed (pretzels, pudding, water, ice).  Swallow was timely with clear voice throughout.  Pt reports swallow ability has improved compared to yesterday.  CN exam unremarkable.  Pt observed to take small bites/sips during evaluation which she states is due to mild deficits PTA.    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  She also complains of pill dysphagia, advised her to attempt whole with pudding or solid - starting and following with liquids.  SLP provided pt with aspiration precautions verbally and in writing given her COPD and chronic mild dysphagia.  Recommend regular/thin diet with general precautions.    SLP to sign off as suspect swallow at baseline.      Aspiration Risk  Mild    Diet Recommendation Regular;Thin liquid   Liquid Administration via: Cup;Straw Medication Administration: Whole meds with liquid (start and follow with water) Supervision: Patient able to self feed Compensations: Slow rate;Small sips/bites Postural Changes and/or  Swallow Maneuvers: Seated upright 90 degrees;Upright 30-60 min after meal    Other  Recommendations Oral Care Recommendations: Oral care BID   Follow Up Recommendations  None    Frequency and Duration        Pertinent Vitals/Pain Low grade fever, decreased    Swallow Study Prior Functional Status   see HHX    General Date of Onset: 07/27/14 HPI: 38 [facility] distress requiring intuation in ED.  PMH + for obesity, COPD, polysubstance abuse, spinal fusion in lower back.  Pt was extubated 12/12 and failed a swallow screen, therefore BSE ordered.   Type of Study: Bedside swallow evaluation Diet Prior to this Study: NPO Temperature Spikes Noted: Yes Respiratory Status: Nasal cannula History of Recent Intubation: Yes Length of Intubations (days): 2 days Date extubated: 07/26/14 Behavior/Cognition: Alert;Cooperative;Pleasant mood Oral Cavity - Dentition: Adequate natural dentition Self-Feeding Abilities: Able to feed self Patient Positioning: Upright in bed Baseline Vocal Quality: Low vocal intensity;Hoarse (slightly hoarse, pt reports some hoarseness at baseline but admits mildly worse currently) Volitional Cough: Strong Volitional Swallow: Able to elicit    Oral/Motor/Sensory Function Overall Oral Motor/Sensory Function: Appears within functional limits for tasks assessed   Ice Chips Ice chips: Within functional limits Presentation: Self Fed;Spoon   Thin Liquid Thin Liquid: Within functional limits Presentation: Straw;Cup    Nectar Thick Nectar Thick Liquid: Not tested   Honey Thick Honey Thick Liquid: Not tested   Puree Puree: Within functional limits Presentation: Spoon;Self Fed   Solid   GO  Solid: Within functional limits Presentation: Self Lisabeth PickFed       Bradden Tadros, MS [facility] SLP [phone]

## 2014-07-27 NOTE — Progress Notes (Signed)
  Echocardiogram 2D Echocardiogram has been performed.  Aris EvertsRix, Ebunoluwa Gernert A 07/27/2014, 4:13 PM

## 2014-07-27 NOTE — Progress Notes (Signed)
1600 Report called to receiving nurse [phone]. Past medical history and present hospitalization reported. All questions answered.  1620 Patient transported with NT via wheelchair with all belongings to [phone]

## 2014-07-27 NOTE — Progress Notes (Signed)
PULMONARY / CRITICAL CARE MEDICINE    Name: Julie Kerr MRN: [id number] DOB: [birth year]    ADMISSION DATE:  07/25/2014  PRIMARY SERVICE: PCCM  CHIEF COMPLAINT:  Dyspnea  BRIEF PATIENT DESCRIPTION: 38 y/o woman w/ hx of asthma who presents with respiratory distress due to significant gap metabolic acidosis  SIGNIFICANT EVENTS / STUDIES:  Intubated in ED 12/12- awake, weaning  SUBJECTIVE:  Extubated 12/12. Tol well.   C/o back pain.  Difficulty swallowing.  UDS POS cocaine x 2.   VITAL SIGNS: Temp:  [98.7 F (37.1 C)-100.5 F (38.1 C)] 98.7 F (37.1 C) (12/13 0738) Pulse Rate:  [83-107] 88 (12/13 0800) Resp:  [17-26] 20 (12/13 0800) BP: (96-139)/(59-79) 96/61 mmHg (12/13 0800) SpO2:  [96 %-100 %] 97 % (12/13 0800) FiO2 (%):  [40 %] 40 % (12/12 1129) Weight:  [185 lb 3 oz (84 kg)] 185 lb 3 oz (84 kg) (12/13 0428) HEMODYNAMICS:   VENTILATOR SETTINGS: Vent Mode:  [-] CPAP;PSV FiO2 (%):  [40 %] 40 % PEEP:  [5 cmH20] 5 cmH20 Pressure Support:  [8 cmH20] 8 cmH20 INTAKE / OUTPUT: Intake/Output      12/12 0701 - 12/13 0700 12/13 0701 - 12/14 0700   I.V. (mL/kg) 4160.8 (49.5) 175 (2.1)   IV Piggyback 100    Total Intake(mL/kg) 4260.8 (50.7) 175 (2.1)   Urine (mL/kg/hr) 4700 (2.3) 350 (1.6)   Total Output 4700 350   Net -439.3 -175          PHYSICAL EXAMINATION: General:  Obese woman, NAD  Neuro:  Awake, alert, appropriate, MAE  HEENT:  MMM, no jvd, hoarse voice  Cardiovascular:  s1s2 tachy  Lungs:  resps even non labored on [place], few scattered rhonchi, slightly diminished R  Abdomen:  Obese, +bs  Musculoskeletal:  No deformities, no edema   LABS:  CBC  Recent Labs Lab 07/26/14 0050 07/26/14 0257 07/27/14 0235  WBC 18.1* 16.6* 14.5*  HGB 10.6* 10.0* 9.3*  HCT 33.5* 31.5* 29.0*  PLT 282 268 255   Coag's  Recent Labs Lab 07/25/14 1844  APTT 26  INR 1.17   BMET  Recent Labs Lab 07/26/14 1127 07/26/14 1950 07/27/14 0235  NA 141 139 142  K 3.3*  3.3* 3.3*  CL 107 103 103  CO2 '22 25 27  ' BUN 6 4* 3*  CREATININE 0.64 0.62 0.67  GLUCOSE 108* 124* 132*   Electrolytes  Recent Labs Lab 07/25/14 1844 07/26/14 0257 07/26/14 1127 07/26/14 1950 07/27/14 0235  CALCIUM 8.4 8.6  8.6 8.4 8.7 8.6  MG 2.5 2.0  --   --   --   PHOS 2.2* 1.9*  --   --   --    Sepsis Markers  Recent Labs Lab 07/25/14 1844 07/25/14 2032 07/26/14 1007 07/26/14 1700  LATICACIDVEN 3.4* 5.5* 1.4 1.5  PROCALCITON <0.10  --   --   --    ABG  Recent Labs Lab 07/26/14 0238 07/26/14 0637 07/26/14 1120  PHART 7.187* 7.365 7.378  PCO2ART 27.8* 26.0* 36.4  PO2ART 70.0* 152.0* 173.0*   Liver Enzymes  Recent Labs Lab 07/25/14 1844 07/26/14 0257  AST 11 15  ALT 10 11  ALKPHOS 58 55  BILITOT 0.2* <0.2*  ALBUMIN 3.4* 3.2*   Cardiac Enzymes  Recent Labs Lab 07/25/14 1718 07/25/14 1844 07/26/14 0050 07/26/14 0257  TROPONINI  --  <0.30 <0.30 <0.30  PROBNP <5.0 <5.0  --   --    Glucose  Recent Labs Lab 07/26/14 0707 07/26/14  1109 07/26/14 1545 07/26/14 1929 07/26/14 2358 07/27/14 0336  GLUCAP 146* 125* 112* 124* 120* 147*    Imaging Dg Chest Portable 1 View  07/27/2014   CLINICAL DATA:  Extubation. Mid chest pain. Current history of asthma.  EXAM: PORTABLE CHEST - 1 VIEW  COMPARISON:  Portable chest x-rays yesterday, 07/25/2014, 05/31/2014.  FINDINGS: Extubation. Nasogastric tube removal. Interval development of dense atelectasis in both lower lobes. Lungs otherwise clear. Cardiac silhouette upper normal in size, accentuated by technique and degree of inspiration.  IMPRESSION: Development of dense bilateral lower lobe atelectasis post extubation.   Electronically Signed   By: Evangeline Dakin M.D.   On: 07/27/2014 08:56   Dg Chest Port 1 View  07/26/2014   CLINICAL DATA:  Acute respiratory failure. On ventilator.  Asthma.  EXAM: PORTABLE CHEST - 1 VIEW  COMPARISON:  07/25/2014  FINDINGS: New nasogastric tube is seen entering the  stomach. Endotracheal tube remains in appropriate position. Both lungs are clear. No evidence of pneumothorax pleural effusion. Heart size is normal.  IMPRESSION: Endotracheal tube and nasogastric tube in appropriate position. No active lung disease.   Electronically Signed   By: Earle Gell M.D.   On: 07/26/2014 07:45   Dg Chest Portable 1 View  07/25/2014   CLINICAL DATA:  38 year old female with shortness of breath, intubated  EXAM: PORTABLE CHEST - 1 VIEW  COMPARISON:  prior chest x-ray earlier today at 17:32 p.m.  FINDINGS: The tip of the endotracheal tube is 1.8 cm above the carina. Cardiac and mediastinal contours remain unchanged. Inspiratory volumes are low were compared to earlier today. Osseous structures are intact and unremarkable. Query developing left basilar opacity.  IMPRESSION: 1. Tip of the endotracheal tube is 1.8 cm above the carina. 2. Developing patchy left basilar opacity which may represent atelectasis or infiltrate.   Electronically Signed   By: Jacqulynn Cadet M.D.   On: 07/25/2014 18:39   Dg Chest Port 1 View  07/25/2014   CLINICAL DATA:  Shortness of breath.  EXAM: PORTABLE CHEST - 1 VIEW  COMPARISON:  May 31, 2014.  FINDINGS: The heart size and mediastinal contours are within normal limits. Both lungs are clear. No pneumothorax or pleural effusion is noted. The visualized skeletal structures are unremarkable.  IMPRESSION: No acute cardiopulmonary abnormality seen.   Electronically Signed   By: Sabino Dick M.D.   On: 07/25/2014 17:43   Dg Abd Portable 1v  07/25/2014   CLINICAL DATA:  OG tube placement  EXAM: PORTABLE ABDOMEN - 1 VIEW  COMPARISON:  Lumbar spine 02/11/2014  FINDINGS: Enteric tube tip is in the upper mid abdomen consistent with location in the distal stomach. Bowel gas pattern is unremarkable. Postoperative changes in the lumbosacral spine.  IMPRESSION: Enteric tube tip projects over the distal stomach.   Electronically Signed   By: Lucienne Capers M.D.    On: 07/25/2014 21:10     ASSESSMENT / PLAN:  PULMONARY ETT 12/11>>>12/12 Acute VDRF - r/t unexplained metabolic acidosis and ?asthma exacerbation although no sig bronchospasm noted.  Significant atx post extubation.  Hx asthma  P:   Cont BD's  Aggressive pulm hygiene  supplemental O2 as needed  ?etiology of metabolic acidosis - see below  F/u CXR with IS Cont to hold steroids for now with no sig bronchospasm  CARDIOVASCULAR A: Sinus Tachycardia - ?agitation v opiate withdrawal.  Troponins negative, EKG ok.  BNP neg.  QTC 610 noted 12/11 ECG - now wnl P:   Cont xopenex  Avoid BB - unopposed alpha F/u ekg QTC better Consider dc tele Replace K   RENAL A: Anion gap metabolic acidosis - ?etiology.  No renal failure, no sig hyperglycemia.  Compensated.  NONAG , AG , small met alk Hypokalemia resolved hypokalemia  P:   F/u chem  ethanol, methanol,acetone -- all neg  D/c HCO3 gtt  Impression is a tox -coc, resolved vasoconstriction Replace K    GASTROINTESTINAL A: No acute issues; keep NPO overnight. P:   PPI  Diet advance  HEMATOLOGIC A: Leukocytosis - Favor stress and steroid induced. P:   F/u cbc  Lovenox, dc if ambulation noted  INFECTIOUS A: Metabolic disarray w/ leukocytosis and lactic acidosis - no apparent cause. Sepsis seems unlikely given clinical picture and no apparent source. P:   BCx2 12/11>>> Urine 12/11>>> Cont hold abx for now   ENDOCRINE A: Hyperglycemic P:   SSI q4.  NEUROLOGIC A: Metabolic encephelopathy- resolved Possible opiate withdrawal Possible toxic ingestion P:   RASS goal= 0 D/c all gtts from [facility]  PRN fent  Will need to identity fent patch dose? She is not reliable history  Will tx to floor and ask [facility] to assume care 12/14.   Nickolas Madrid, NP 07/27/2014  9:41 AM Pager: [phone] or [phone]   STAFF NOTE: I, Merrie Roof, MD FACP have personally reviewed patient's available data, including  medical history, events of note, physical examination and test results as part of my evaluation. I have discussed with resident/NP and other care providers such as pharmacist, RN and RRT. In addition, I personally evaluated patient and elicited key findings of: coc pos x 2 explains acidosis, resolved, dc bicarb, to tele, [facility], IS, pcxr for atx in am   Lavon Paganini. Titus Mould, MD, [place] Pgr: [place] [facility] 07/27/2014 10:26 AM

## 2014-07-28 ENCOUNTER — Inpatient Hospital Stay (HOSPITAL_COMMUNITY): Payer: Medicaid Other

## 2014-07-28 ENCOUNTER — Encounter (HOSPITAL_COMMUNITY): Payer: Self-pay | Admitting: *Deleted

## 2014-07-28 DIAGNOSIS — E872 Acidosis, unspecified: Secondary | ICD-10-CM | POA: Diagnosis present

## 2014-07-28 DIAGNOSIS — F1910 Other psychoactive substance abuse, uncomplicated: Secondary | ICD-10-CM | POA: Diagnosis present

## 2014-07-28 DIAGNOSIS — J9601 Acute respiratory failure with hypoxia: Principal | ICD-10-CM

## 2014-07-28 DIAGNOSIS — J189 Pneumonia, unspecified organism: Secondary | ICD-10-CM

## 2014-07-28 DIAGNOSIS — J45901 Unspecified asthma with (acute) exacerbation: Secondary | ICD-10-CM

## 2014-07-28 LAB — BASIC METABOLIC PANEL
ANION GAP: 12 (ref 5–15)
BUN: 8 mg/dL (ref 6–23)
CALCIUM: 9.3 mg/dL (ref 8.4–10.5)
CO2: 23 meq/L (ref 19–32)
Chloride: 100 mEq/L (ref 96–112)
Creatinine, Ser: 0.76 mg/dL (ref 0.50–1.10)
GFR calc Af Amer: >90 mL/min (ref >90)
GFR calc non Af Amer: >90 mL/min (ref >90)
Glucose, Bld: 95 mg/dL (ref 70–99)
Potassium: 4.1 mEq/L (ref 3.7–5.3)
SODIUM: 135 meq/L — AB (ref 137–147)

## 2014-07-28 LAB — GLUCOSE, CAPILLARY
GLUCOSE-CAPILLARY: 133 mg/dL — AB (ref 70–99)
Glucose-Capillary: 100 mg/dL — ABNORMAL HIGH (ref 70–99)
Glucose-Capillary: 207 mg/dL — ABNORMAL HIGH (ref 70–99)
Glucose-Capillary: 149 mg/dL — ABNORMAL HIGH (ref 70–99)

## 2014-07-28 LAB — BLOOD GAS, ARTERIAL
ACID-BASE DEFICIT: 1.4 mmol/L (ref 0.0–2.0)
BICARBONATE: 22.6 meq/L (ref 20.0–24.0)
Drawn by: 39898
O2 Content: 4 L/min
O2 Saturation: 97.7 %
PATIENT TEMPERATURE: 98.6
TCO2: 23.7 mmol/L (ref 0–100)
pCO2 arterial: 36 mmHg (ref 35.0–45.0)
pH, Arterial: 7.413 (ref 7.350–7.450)
pO2, Arterial: 103 mmHg — ABNORMAL HIGH (ref 80.0–100.0)

## 2014-07-28 LAB — TROPONIN I: Troponin I: <0.3 ng/mL (ref <0.30)

## 2014-07-28 MED ORDER — VANCOMYCIN HCL IN DEXTROSE 1-5 GM/200ML-% IV SOLN
1000.0000 mg | Freq: Three times a day (TID) | INTRAVENOUS | Status: DC
  Administered 2014-07-28 – 2014-07-29 (×4): 1000 mg via INTRAVENOUS
  Filled 2014-07-28 (×7): qty 200

## 2014-07-28 MED ORDER — FUROSEMIDE 10 MG/ML IJ SOLN
40.0000 mg | Freq: Once | INTRAMUSCULAR | Status: AC
  Administered 2014-07-28: 40 mg via INTRAVENOUS
  Filled 2014-07-28: qty 4

## 2014-07-28 MED ORDER — METHYLPREDNISOLONE SODIUM SUCC 125 MG IJ SOLR
60.0000 mg | Freq: Once | INTRAMUSCULAR | Status: AC
  Administered 2014-07-28: 60 mg via INTRAVENOUS
  Filled 2014-07-28: qty 0.96

## 2014-07-28 MED ORDER — LORAZEPAM 2 MG/ML IJ SOLN
INTRAMUSCULAR | Status: DC
Start: 2014-07-28 — End: 2014-07-28
  Filled 2014-07-28: qty 1

## 2014-07-28 MED ORDER — PANTOPRAZOLE SODIUM 40 MG PO TBEC
40.0000 mg | DELAYED_RELEASE_TABLET | Freq: Every day | ORAL | Status: DC
  Administered 2014-07-29 – 2014-07-31 (×3): 40 mg via ORAL
  Filled 2014-07-28 (×4): qty 1

## 2014-07-28 MED ORDER — ADULT MULTIVITAMIN W/MINERALS CH
1.0000 | ORAL_TABLET | Freq: Every day | ORAL | Status: DC
  Administered 2014-07-28 – 2014-08-01 (×5): 1 via ORAL
  Filled 2014-07-28 (×5): qty 1

## 2014-07-28 MED ORDER — ALBUTEROL SULFATE (2.5 MG/3ML) 0.083% IN NEBU
2.5000 mg | INHALATION_SOLUTION | RESPIRATORY_TRACT | Status: DC | PRN
  Administered 2014-07-28: 2.5 mg via RESPIRATORY_TRACT

## 2014-07-28 MED ORDER — RACEPINEPHRINE HCL 2.25 % IN NEBU
INHALATION_SOLUTION | RESPIRATORY_TRACT | Status: AC
  Administered 2014-07-28: 0.5 mL
  Filled 2014-07-28: qty 0.5

## 2014-07-28 MED ORDER — PIPERACILLIN-TAZOBACTAM 3.375 G IVPB
3.3750 g | Freq: Three times a day (TID) | INTRAVENOUS | Status: DC
  Administered 2014-07-28 – 2014-07-30 (×7): 3.375 g via INTRAVENOUS
  Filled 2014-07-28 (×9): qty 50

## 2014-07-28 MED ORDER — ALPRAZOLAM 0.5 MG PO TABS
1.0000 mg | ORAL_TABLET | Freq: Three times a day (TID) | ORAL | Status: DC | PRN
  Administered 2014-07-28 – 2014-08-01 (×5): 1 mg via ORAL
  Filled 2014-07-28 (×5): qty 2

## 2014-07-28 MED ORDER — ALBUTEROL SULFATE (2.5 MG/3ML) 0.083% IN NEBU
INHALATION_SOLUTION | RESPIRATORY_TRACT | Status: AC
  Filled 2014-07-28: qty 3

## 2014-07-28 MED ORDER — GABAPENTIN 100 MG PO CAPS
200.0000 mg | ORAL_CAPSULE | ORAL | Status: DC
  Administered 2014-07-28 – 2014-08-05 (×8): 200 mg via ORAL
  Filled 2014-07-28 (×10): qty 2

## 2014-07-28 MED ORDER — ALPRAZOLAM 0.5 MG PO TABS
1.0000 mg | ORAL_TABLET | Freq: Once | ORAL | Status: AC
  Administered 2014-07-28: 1 mg via ORAL
  Filled 2014-07-28: qty 2

## 2014-07-28 MED ORDER — GUAIFENESIN ER 600 MG PO TB12
1200.0000 mg | ORAL_TABLET | Freq: Two times a day (BID) | ORAL | Status: DC
  Administered 2014-07-28 – 2014-07-30 (×4): 1200 mg via ORAL
  Filled 2014-07-28 (×6): qty 2

## 2014-07-28 MED ORDER — METHYLPREDNISOLONE SODIUM SUCC 125 MG IJ SOLR
INTRAMUSCULAR | Status: AC
  Administered 2014-07-28: 125 mg
  Filled 2014-07-28: qty 2

## 2014-07-28 MED ORDER — GABAPENTIN 300 MG PO CAPS
300.0000 mg | ORAL_CAPSULE | Freq: Two times a day (BID) | ORAL | Status: DC
  Administered 2014-07-29 – 2014-08-06 (×17): 300 mg via ORAL
  Filled 2014-07-28 (×20): qty 1

## 2014-07-28 MED ORDER — LORAZEPAM 2 MG/ML IJ SOLN
1.0000 mg | Freq: Once | INTRAMUSCULAR | Status: AC
  Administered 2014-07-28: 1 mg via INTRAVENOUS
  Filled 2014-07-28: qty 1

## 2014-07-28 NOTE — Progress Notes (Signed)
RT called to patient's room by RN due to patient in distress. RT found the patient to be in severe distress with inspiratory/expiratory wheezing and upper airway wheezing as well. RN was already giving xopenex treatment.  RT ordered PRN albuterol and asked if patient had any IV steroids ordered. RT suggested to call rapid response in order to get attention for the acute exacerbation.

## 2014-07-28 NOTE — Progress Notes (Signed)
Patient was in her room laughing with friend and started coughing and wheezing around 2000 tonight.  RN notified and patient was sating at 100% on 2L.  Administered Xopenex neb treatment and at 2024 gave Fentanyl 25mcg IV to help calm patient since Xanax was in PO form.  Patient continued to sat between 96% - 100% however her wheezing and breathing continued to be labored.  Charge nurse, respiratory, on call doctor, and rapid response was contacted.  Patient was moved to 64M-10 ICU at 2100.  Patient traveled via bed and all staff mentioned along with RN.

## 2014-07-28 NOTE — Progress Notes (Signed)
Took pt off of BiPAP and placed pt on Venturi Mask, pt is tolerating well at this time.

## 2014-07-28 NOTE — Progress Notes (Signed)
Patient requested to take a bath in the bathroom at the sink. NT assisted patient to the bathroom. Shortly after NT called RN into the room. Patient was in what appeared to be respiratory distress. Patient was gasping for air. RN called Respiratory and Rapid Response both of which were busy at the moment and could not come. Respiratory advised the RN to administer the patient an additional neb treatment (Xopenex). RN administered additional neb treatment. RN called on call doctor for Hospitalists. Doctor ordered ABG labs, troponin, Solu-medrol, and EKG. NP Lenny Pastelom Callahan came to assess patient on the floor. Awaiting results from drawn labs. Nursing will continue to monitor.

## 2014-07-28 NOTE — Progress Notes (Signed)
PULMONARY / CRITICAL CARE MEDICINE    Name: Julie Kerr MRN: [id number] DOB: 12/11/1975    ADMISSION DATE:  07/25/2014  PRIMARY SERVICE: PCCM  CHIEF COMPLAINT:  Dyspnea  BRIEF PATIENT DESCRIPTION: 38 y/o woman w/ hx of asthma who presents with respiratory distress due to significant gap metabolic acidosis. She was intubated, to ICU. Was extubated 12/2 and sent to floor. 12/14 she developed acute onset respiratory distress, transferred to ICU and placed on BiPAP.   SIGNIFICANT EVENTS / STUDIES:  12/11 Intubated in ED 12/12- awake, weaning, extubated 12/13 - to floor, TRH 12/14 - respiratory distress, back to ICU,BiPAP  INTERVAL:  [facility] for respiratory distress. She was reportedly coughing while talking with a friend and had rapid onset of inspiratory and expiratory stridor/wheeze. O2 sats were noted to be in the 80s and she was very anxious. PCCM called to evaluate.   VITAL SIGNS: Temp:  [97.7 F (36.5 C)-98.7 F (37.1 C)] 97.7 F (36.5 C) (12/14 1136) Pulse Rate:  [80-90] 81 (12/14 1136) Resp:  [18] 18 (12/14 1136) BP: (98-122)/(62-67) 122/67 mmHg (12/14 1136) SpO2:  [99 %-100 %] 99 % (12/14 2105) FiO2 (%):  [50 %] 50 % (12/14 2106) Weight:  [81.33 kg (179 lb 4.8 oz)] 81.33 kg (179 lb 4.8 oz) (12/14 1055) HEMODYNAMICS:   VENTILATOR SETTINGS: Vent Mode:  [-] BIPAP FiO2 (%):  [50 %] 50 % Set Rate:  [15 bmp] 15 bmp INTAKE / OUTPUT: Intake/Output      12/14 0701 - 12/15 0700   P.O.    I.V. (mL/kg)    Total Intake(mL/kg)    Urine (mL/kg/hr)    Total Output     Net           PHYSICAL EXAMINATION: General:  Obese woman, in profound distress Neuro:  Awake, alert, appropriate. Non-focal. HEENT:  MMM, no jvd Cardiovascular:  s1s2 tachy  Lungs:  Respirations shallow, rapid, with profound upper airway sounds. Good air movement. Abdomen:  Obese, +bs  Musculoskeletal:  No deformities, no edema   LABS:  CBC  Recent Labs Lab 07/26/14 0050  07/26/14 0257 07/27/14 0235  WBC 18.1* 16.6* 14.5*  HGB 10.6* 10.0* 9.3*  HCT 33.5* 31.5* 29.0*  PLT 282 268 255   Coag's  Recent Labs Lab 07/25/14 1844  APTT 26  INR 1.17   BMET  Recent Labs Lab 07/26/14 1950 07/27/14 0235 07/28/14 0555  NA 139 142 135*  K 3.3* 3.3* 4.1  CL 103 103 100  CO2 25 27 23   BUN 4* 3* 8  CREATININE 0.62 0.67 0.76  GLUCOSE 124* 132* 95   Electrolytes  Recent Labs Lab 07/25/14 1844 07/26/14 0257  07/26/14 1950 07/27/14 0235 07/28/14 0555  CALCIUM 8.4 8.6  8.6  < > 8.7 8.6 9.3  MG 2.5 2.0  --   --   --   --   PHOS 2.2* 1.9*  --   --   --   --   < > = values in this interval not displayed. Sepsis Markers  Recent Labs Lab 07/25/14 1844 07/25/14 2032 07/26/14 1007 07/26/14 1700  LATICACIDVEN 3.4* 5.5* 1.4 1.5  PROCALCITON <0.10  --   --   --    ABG  Recent Labs Lab 07/26/14 0637 07/26/14 1120 07/28/14 0420  PHART 7.365 7.378 7.413  PCO2ART 26.0* 36.4 36.0  PO2ART 152.0* 173.0* 103.0*   Liver Enzymes  Recent Labs Lab 07/25/14 1844 07/26/14 0257  AST 11 15  ALT  10 11  ALKPHOS 58 55  BILITOT 0.2* <0.2*  ALBUMIN 3.4* 3.2*   Cardiac Enzymes  Recent Labs Lab 07/25/14 1718  07/25/14 1844 07/26/14 0050 07/26/14 0257 07/28/14 0555  TROPONINI  --   < > <0.30 <0.30 <0.30 <0.30  PROBNP <5.0  --  <5.0  --   --   --   < > = values in this interval not displayed. Glucose  Recent Labs Lab 07/27/14 1557 07/27/14 2316 07/28/14 0515 07/28/14 0913 07/28/14 1230 07/28/14 1642  GLUCAP 96 118* 100* 207* 149* 133*    Imaging Dg Chest Port 1 View  07/28/2014   CLINICAL DATA:  Shortness of breath.  EXAM: PORTABLE CHEST - 1 VIEW  COMPARISON:  07/27/2014.  FINDINGS: Mediastinum and hilar structures are normal. Persistent mild bibasilar atelectasis and/or infiltrates. Stable cardiomegaly with normal pulmonary vascularity. Small pleural effusions cannot be excluded. No pneumothorax. No acute osseus abnormality.   IMPRESSION: Persistent mild bibasilar atelectasis and/or infiltrates. Associated small pleural effusions cannot be excluded. Chest is stable from prior exam.   Electronically Signed   By: Maisie Fushomas  Register   On: 07/28/2014 07:43   Dg Chest Port 1 View  07/27/2014   CLINICAL DATA:  Fever, cough.  Chest pain.  EXAM: PORTABLE CHEST - 1 VIEW  COMPARISON:  Same day.  FINDINGS: The heart size and mediastinal contours are within normal limits. No pneumothorax is noted. Stable mild bibasilar opacities are noted concerning for pneumonia or atelectasis with associated pleural effusions. The visualized skeletal structures are unremarkable.  IMPRESSION: Stable mild bibasilar opacities are noted concerning for pneumonia or atelectasis with associated pleural effusions.   Electronically Signed   By: Roque LiasJames  Green M.D.   On: 07/27/2014 23:07   Dg Chest Portable 1 View  07/27/2014   CLINICAL DATA:  Extubation. Mid chest pain. Current history of asthma.  EXAM: PORTABLE CHEST - 1 VIEW  COMPARISON:  Portable chest x-rays yesterday, 07/25/2014, 05/31/2014.  FINDINGS: Extubation. Nasogastric tube removal. Interval development of dense atelectasis in both lower lobes. Lungs otherwise clear. Cardiac silhouette upper normal in size, accentuated by technique and degree of inspiration.  IMPRESSION: Development of dense bilateral lower lobe atelectasis post extubation.   Electronically Signed   By: Hulan Saashomas  Lawrence M.D.   On: 07/27/2014 08:56     ASSESSMENT / PLAN:  PULMONARY ETT 12/11>>>12/12 Acute respiratory faiulre - ?asthma exacerbation although no sig bronchospasm noted. VCD vs stridor. Suspect VCD given rapid onset and 2 days post extubation.  Significant atx post extubation.  Hx asthma   P:   Start PRN BiPAP Solumedrol x 1 Racemic epi x 1 Scheduled BD's, consider increasing Aggressive pulm hygiene  Consider CT neck in AM  CXR in AM  CARDIOVASCULAR A: Sinus Tachycardia - ?agitation v opiate withdrawal.   Troponins negative, EKG ok.  BNP neg.  QTC prolongation - 610 noted 12/11 ECG - now wnl  P:   Tele Cont xopenex  Avoid BB - unopposed alpha F/u ekg QTC better Replace K   RENAL A: Hypokalemia resolved   P:   F/u chem  Impression is a tox -cocaine, resolved vasoconstriction  GASTROINTESTINAL A: No acute issues; keep NPO overnight  P:   PPI  Diet advance  HEMATOLOGIC A: Leukocytosis - Favor stress and steroid induced.  P:   F/u cbc  Lovenox, dc if ambulation noted  INFECTIOUS A: Concern for PNA with fevers 12/14, ddx includes ATX. [facility]   P:   BCx2 12/11>>>  Urine 12/11>>> Neg Abx: Vanc, start date 12/14, day 1 Abx: Zosyn, start date 12/14, day 1 Follow WBC and fevers  ENDOCRINE A: Hyperglycemic  P:   SSI q4.  NEUROLOGIC A: Metabolic encephelopathy- resolved Possible opiate withdrawal Possible toxic ingestion  P:   RASS goal= 0 PRN fent  Will need to identity fent patch dose? She is not reliable history PRN ativan   Joneen Roach, ACNP [place] [facility] Pager [phone] or [phone]  Attending:  I have seen and examined Ms. Cabezas with Joneen Roach and agree with his assessment and plan above. On exam she has loud upper airway stridor which is variable.  She has good air movement on exam and tells me that she is starting to feel better.  At this point I think that this is mostly due to VCD, but we will watch her carefully in the ICU and will d/c BIPAP in about an hour.  May need CT neck vs airway exam bronch in AM if stridor continues, will monitor.  CC time by me 40 minutes  [place], MD [place] PCCM Pager: [phone] Cell: [phone] If no response, call [phone]

## 2014-07-28 NOTE — Progress Notes (Signed)
Patient stating that pain medication and steroids are not giving her any relief. Hospitalists notified. Nursing will continue to monitor.

## 2014-07-28 NOTE — Progress Notes (Signed)
Called to see pt due to respiratory distress. Spoke to RRRN at bedside who had already called PCCM and pt is being transferred to ICU under PCCM care. PCCM familiar with pt as she was under their care for respiratory failure and was extubated 07/26/14. NP to bedside. PCCM NP at bedside and pt has received 2 nebs and 125mg  Solumedrol. Escorted pt for transfer to 36M and PCCM has assumed care.  Jimmye NormanKaren Kirby-Graham, NP [facility]

## 2014-07-28 NOTE — Progress Notes (Signed)
ANTIBIOTIC CONSULT NOTE - INITIAL  Pharmacy Consult for Vancomycin/Zosyn  Indication: rule out pneumonia  Allergies  Allergen Reactions  . Robitussin Dm [Dextromethorphan-Guaifenesin] Nausea And Vomiting  . Nsaids Hives  . Rayon, Purified Hives  . Tramadol Hives   Patient Measurements: Height: 5\' 3"  (160 cm) Weight: 185 lb 3 oz (84 kg) IBW/kg (Calculated) : 52.4  Vital Signs: Temp: 98.6 F (37 C) (12/14 0415) Temp Source: Oral (12/14 0415) BP: 98/62 mmHg (12/14 0415) Pulse Rate: 80 (12/14 0415)  Labs:  Recent Labs  07/26/14 0050 07/26/14 0257 07/26/14 1127 07/26/14 1950 07/27/14 0235  WBC 18.1* 16.6*  --   --  14.5*  HGB 10.6* 10.0*  --   --  9.3*  PLT 282 268  --   --  255  CREATININE 0.83 0.77  0.83 0.64 0.62 0.67   Estimated Creatinine Clearance: 99.8 mL/min (by C-G formula based on Cr of 0.67).  Medical History: Past Medical History  Diagnosis Date  . Asthma   . Anxiety   . COPD (chronic obstructive pulmonary disease)   . Bronchitis   . Heart murmur   . Shortness of breath    Assessment: 38 y/o F to start broad spectrum antibiotics for r/o PNA. WBC is elevated, renal function good, other labs as above.   Goal of Therapy:  Vancomycin trough level 15-20 mcg/ml  Plan:  -Vancomycin 1000 mg IV q8h -Zosyn 3.375G IV q8h to be infused over 4 hours -Trend WBC, temp, renal function  -Drug levels as indicated   Abran DukeLedford, Christinia Lambeth 07/28/2014,6:50 AM

## 2014-07-28 NOTE — Significant Event (Signed)
Rapid Response Event Note Called to see pt for Resp. distress Overview: Time Called: 2032 Arrival Time: 2034 Event Type: Respiratory  Initial Focused Assessment:  Pt tripoding in bed and unable to speak in short phrases.  Upper airway stridor and poor air movement.  Currently receiving a second neb tx with little to no effect.  Spoke with Dr. Vassie LollAlva as pt has been intubated in the past.  Plan STAT tx to MICU.  Renae FicklePaul NP to bedside. Interventions: Solumedrol 125mg  Tx to MICU  Event Summary: Name of Physician Notified: Donnamarie PoagK. Kirby, NP at    Name of Consulting Physician Notified: Dr. Vassie LollAlva at    Outcome: Transferred (Comment)     Tamirah George Hedgecock

## 2014-07-28 NOTE — Progress Notes (Signed)
[facility] hospitalist progress note. Chief complaint. Dyspnea, wheezing. History of present illness. This 38 year old female with known history of asthma presented [facility] distress and Metabolic acidosis. Patient was initially treated by critical care service that has now been transferred out of ICU and care has been assumed by [facility] hospitalist. Tonight the patient developed acute onset of wheezing and dyspnea. My associate on call ordered chest x-ray, ABG, EKG, troponin, and a one-time dose of Solu-Medrol. At one point the patient described chest tightness and pain radiating to the back and legs. I was notified and came up to see the patient at bedside. Rapid response was also on site. I found the patient tearful and complaining of pain but her respiratory status appeared to stabilize significantly. She denied any current chest pain. Physical exam. Vital signs. Temperature 98.6 will pulse 80, respirations 22, blood pressure 98/62. O2 sats 100% on nasal cannula oxygen. General appearance. Anxious and tearful female patient complains of generalized pain. I see no evidence of respiratory distress. Cardiac. Rate and rhythm regular. No jugular venous distention or edema. Lungs. Somewhat reduced in the bases. Mild expiratory wheezing heard. No distress and stable O2 sats. Impression/plan. Problem #1. Asthma exacerbation. Patient currently appears more stable and per nursing she is back to her prior baseline from earlier tonight. Earlier chest x-ray suggested atelectasis versus pneumonia in the bases but critical care did not feel there was a infectious etiology from yesterday's note. We'll follow for pending ABG and repeat chest x-ray. Patient appears relatively stable at this point status post Solu-Medrol. Problem #2. Chest tightness. No known history of coronary artery disease. Patient's description sounds more like respiratory symptoms than cardiac.  Nonetheless we'll follow for ordered 12-lead EKG and troponin.

## 2014-07-28 NOTE — Progress Notes (Signed)
[facility] PROGRESS NOTE   Julie Kerr ZOX:[id number] DOB: 03/20/1976 DOA: 07/25/2014 PCP: Lu DuffelWELLS,WENDELL, MD  HPI/Subjective: Feels much better, denies any complains.  Assessment/Plan: Principal Problem:   Acute respiratory failure with hypoxia Active Problems:   Chronic pain syndrome   Upper airway cough syndrome, severe, with clinical VCD   HCAP (healthcare-associated pneumonia)   Polysubstance abuse   Metabolic acidosis    Acute respiratory failure with hypoxia Patient intubated on admission and extubated on 12/12. This is likely secondary to asthma exacerbation along with metabolic acidosis and anion gap upon admission. Patient improving, currently on 2 L per nasal cannula, wean to room air.  Metabolic acidosis Anion gap metabolic acidosis with compensation. Unclear etiology, negative for ethyl, methyl alcohol and acetone.  Chronic pain Patient has lower back pain from degenerative joint disease and previous to spinal surgeries. [facility] [place]  Pneumonia Patient developed respiratory distress and fever of 101.1, chest x-ray questionable pneumonia. Started on vancomycin and Zosyn, treated as hospital-acquired pneumonia. Keep patient in the negative balance side, one dose of Lasix.  Polysubstance abuse Patient UDS come back positive for cocaine and benzodiazepines. Surprisingly not positive for opiates, she supposed to be on opiates for chronic back pain. Follow-up with PCP/pain clinic as outpatient.  Code Status: Full code Family Communication: Plan discussed with the patient. Disposition Plan: Remains inpatient   Consultants:  Was under PCCM, hospitalist assumed care on 07/28/2014  Procedures:  Intubation/extubation and mechanical ventilation, extubated on 07/26/2014  Antibiotics:  Vancomycin and Zosyn   Objective: Filed Vitals:   07/28/14 0415  BP: 98/62  Pulse: 80  Temp: 98.6  F (37 C)  Resp: 18    Intake/Output Summary (Last 24 hours) at 07/28/14 1117 Last data filed at 07/28/14 0700  Gross per 24 hour  Intake    440 ml  Output    525 ml  Net    -85 ml   Filed Weights   07/26/14 0500 07/27/14 0428 07/28/14 1055  Weight: 82.8 kg (182 lb 8.7 oz) 84 kg (185 lb 3 oz) 81.33 kg (179 lb 4.8 oz)    Exam: General: Alert and awake, oriented x3, not in any acute distress. HEENT: anicteric sclera, pupils reactive to light and accommodation, EOMI CVS: S1-S2 clear, no murmur rubs or gallops Chest: clear to auscultation bilaterally, no wheezing, rales or rhonchi Abdomen: soft nontender, nondistended, normal bowel sounds, no organomegaly Extremities: no cyanosis, clubbing or edema noted bilaterally Neuro: Cranial nerves II-XII intact, no focal neurological deficits  Data Reviewed: Basic Metabolic Panel:  Recent Labs Lab 07/25/14 1844  07/26/14 0257 07/26/14 1127 07/26/14 1950 07/27/14 0235 07/28/14 0555  NA 138  --  140  141 141 139 142 135*  K 2.6*  --  4.1  4.0 3.3* 3.3* 3.3* 4.1  CL 103  --  108  109 107 103 103 100  CO2 18*  --  10*  10* 22 25 27 23   GLUCOSE 195*  --  198*  195* 108* 124* 132* 95  BUN 9  --  8  8 6  4* 3* 8  CREATININE 0.67  < > 0.77  0.83 0.64 0.62 0.67 0.76  CALCIUM 8.4  --  8.6  8.6 8.4 8.7 8.6 9.3  MG 2.5  --  2.0  --   --   --   --   PHOS 2.2*  --  1.9*  --   --   --   --   < > =  values in this interval not displayed. Liver Function Tests:  Recent Labs Lab 07/25/14 1844 07/26/14 0257  AST 11 15  ALT 10 11  ALKPHOS 58 55  BILITOT 0.2* <0.2*  PROT 6.7 6.8  ALBUMIN 3.4* 3.2*    Recent Labs Lab 07/25/14 1844  LIPASE 22  AMYLASE 90   No results for input(s): AMMONIA in the last 168 hours. CBC:  Recent Labs Lab 07/25/14 1718 07/25/14 1844 07/26/14 0050 07/26/14 0257 07/27/14 0235  WBC 8.6 22.6* 18.1* 16.6* 14.5*  NEUTROABS  --  18.3*  --   --   --   HGB 12.2 11.0* 10.6* 10.0* 9.3*  HCT 37.7 34.0*  33.5* 31.5* 29.0*  MCV 80.7 81.0 79.8 79.5 78.8  PLT 320 271 282 268 255   Cardiac Enzymes:  Recent Labs Lab 07/25/14 1844 07/26/14 0050 07/26/14 0257 07/28/14 0555  CKTOTAL  --   --  37  --   TROPONINI <0.30 <0.30 <0.30 <0.30   BNP (last 3 results)  Recent Labs  05/06/14 2328 07/25/14 1718 07/25/14 1844  PROBNP <5.0 <5.0 <5.0   CBG:  Recent Labs Lab 07/27/14 1137 07/27/14 1557 07/27/14 2316 07/28/14 0515 07/28/14 0913  GLUCAP 85 96 118* 100* 207*    Micro Recent Results (from the past 240 hour(s))  Urine culture     Status: None   Collection Time: 07/25/14  8:06 PM  Result Value Ref Range Status   Specimen Description URINE, CATHETERIZED  Final   Special Requests NONE  Final   Culture  Setup Time   Final    07/25/2014 22:41 Performed at [facility]    Colony Count NO GROWTH Performed at [facility]   Final   Culture NO GROWTH Performed at [facility]   Final   Report Status 07/27/2014 FINAL  Final  MRSA PCR Screening     Status: None   Collection Time: 07/25/14  8:22 PM  Result Value Ref Range Status   MRSA by PCR NEGATIVE NEGATIVE Final    Comment:        The GeneXpert MRSA Assay (FDA approved for NASAL specimens only), is one component of a comprehensive MRSA colonization surveillance program. It is not intended to diagnose MRSA infection nor to guide or monitor treatment for MRSA infections.   Culture, blood (routine x 2)     Status: None (Preliminary result)   Collection Time: 07/25/14  8:23 PM  Result Value Ref Range Status   Specimen Description BLOOD RIGHT HAND  Final   Special Requests BOTTLES DRAWN AEROBIC ONLY 5CC  Final   Culture  Setup Time   Final    07/26/2014 00:56 Performed at [facility]    Culture   Final           BLOOD CULTURE RECEIVED NO GROWTH TO DATE CULTURE WILL BE HELD FOR 5 DAYS BEFORE ISSUING A FINAL NEGATIVE REPORT Performed at [facility]    Report Status  PENDING  Incomplete  Culture, blood (routine x 2)     Status: None (Preliminary result)   Collection Time: 07/25/14  8:32 PM  Result Value Ref Range Status   Specimen Description BLOOD LEFT HAND  Final   Special Requests BOTTLES DRAWN AEROBIC ONLY 8CC  Final   Culture  Setup Time   Final    07/26/2014 00:56 Performed at [facility]    Culture   Final           BLOOD  CULTURE RECEIVED NO GROWTH TO DATE CULTURE WILL BE HELD FOR 5 DAYS BEFORE ISSUING A FINAL NEGATIVE REPORT Performed at [facility]    Report Status PENDING  Incomplete     Studies: Dg Chest Port 1 View  07/28/2014   CLINICAL DATA:  Shortness of breath.  EXAM: PORTABLE CHEST - 1 VIEW  COMPARISON:  07/27/2014.  FINDINGS: Mediastinum and hilar structures are normal. Persistent mild bibasilar atelectasis and/or infiltrates. Stable cardiomegaly with normal pulmonary vascularity. Small pleural effusions cannot be excluded. No pneumothorax. No acute osseus abnormality.  IMPRESSION: Persistent mild bibasilar atelectasis and/or infiltrates. Associated small pleural effusions cannot be excluded. Chest is stable from prior exam.   Electronically Signed   By: Maisie Fus  Register   On: 07/28/2014 07:43   Dg Chest Port 1 View  07/27/2014   CLINICAL DATA:  Fever, cough.  Chest pain.  EXAM: PORTABLE CHEST - 1 VIEW  COMPARISON:  Same day.  FINDINGS: The heart size and mediastinal contours are within normal limits. No pneumothorax is noted. Stable mild bibasilar opacities are noted concerning for pneumonia or atelectasis with associated pleural effusions. The visualized skeletal structures are unremarkable.  IMPRESSION: Stable mild bibasilar opacities are noted concerning for pneumonia or atelectasis with associated pleural effusions.   Electronically Signed   By: Roque Lias M.D.   On: 07/27/2014 23:07   Dg Chest Portable 1 View  07/27/2014   CLINICAL DATA:  Extubation. Mid chest pain. Current history of asthma.  EXAM: PORTABLE  CHEST - 1 VIEW  COMPARISON:  Portable chest x-rays yesterday, 07/25/2014, 05/31/2014.  FINDINGS: Extubation. Nasogastric tube removal. Interval development of dense atelectasis in both lower lobes. Lungs otherwise clear. Cardiac silhouette upper normal in size, accentuated by technique and degree of inspiration.  IMPRESSION: Development of dense bilateral lower lobe atelectasis post extubation.   Electronically Signed   By: Hulan Saas M.D.   On: 07/27/2014 08:56    Scheduled Meds: . antiseptic oral rinse  7 mL Mouth Rinse BID  . enoxaparin (LOVENOX) injection  40 mg Subcutaneous Q24H  . fentaNYL  50 mcg Transdermal Q72H  . insulin aspart  0-20 Units Subcutaneous 6 times per day  . ipratropium  0.5 mg Nebulization Q6H  . levalbuterol  0.63 mg Nebulization Q6H  . pantoprazole (PROTONIX) IV  40 mg Intravenous QHS  . piperacillin-tazobactam (ZOSYN)  IV  3.375 g Intravenous 3 times per day  . vancomycin  1,000 mg Intravenous Q8H   Continuous Infusions: . dextrose 5 % and 0.45% NaCl 50 mL/hr (07/28/14 0055)       Time spent: 35 minutes    [facility]  [facility] Pager [phone] If 7PM-7AM, please contact night-coverage at [email or web address], password [facility] 07/28/2014, 11:17 AM  LOS: 3 days

## 2014-07-29 ENCOUNTER — Inpatient Hospital Stay (HOSPITAL_COMMUNITY): Payer: Medicaid Other

## 2014-07-29 DIAGNOSIS — R059 Cough, unspecified: Secondary | ICD-10-CM | POA: Insufficient documentation

## 2014-07-29 DIAGNOSIS — G894 Chronic pain syndrome: Secondary | ICD-10-CM

## 2014-07-29 DIAGNOSIS — J9600 Acute respiratory failure, unspecified whether with hypoxia or hypercapnia: Secondary | ICD-10-CM | POA: Insufficient documentation

## 2014-07-29 DIAGNOSIS — R05 Cough: Secondary | ICD-10-CM

## 2014-07-29 LAB — LEGIONELLA ANTIGEN, URINE

## 2014-07-29 LAB — BASIC METABOLIC PANEL
Anion gap: 13 (ref 5–15)
BUN: 9 mg/dL (ref 6–23)
CALCIUM: 9.6 mg/dL (ref 8.4–10.5)
CO2: 21 meq/L (ref 19–32)
CREATININE: 0.65 mg/dL (ref 0.50–1.10)
Chloride: 101 mEq/L (ref 96–112)
GFR calc Af Amer: >90 mL/min (ref >90)
GFR calc non Af Amer: >90 mL/min (ref >90)
Glucose, Bld: 219 mg/dL — ABNORMAL HIGH (ref 70–99)
Potassium: 4.1 mEq/L (ref 3.7–5.3)
Sodium: 135 mEq/L — ABNORMAL LOW (ref 137–147)

## 2014-07-29 LAB — GLUCOSE, CAPILLARY
GLUCOSE-CAPILLARY: 109 mg/dL — AB (ref 70–99)
GLUCOSE-CAPILLARY: 112 mg/dL — AB (ref 70–99)
Glucose-Capillary: 108 mg/dL — ABNORMAL HIGH (ref 70–99)
Glucose-Capillary: 143 mg/dL — ABNORMAL HIGH (ref 70–99)
Glucose-Capillary: 166 mg/dL — ABNORMAL HIGH (ref 70–99)
Glucose-Capillary: 229 mg/dL — ABNORMAL HIGH (ref 70–99)

## 2014-07-29 LAB — TROPONIN I
Troponin I: <0.3 ng/mL (ref <0.30)
Troponin I: <0.3 ng/mL (ref <0.30)

## 2014-07-29 LAB — TRIGLYCERIDES: Triglycerides: 76 mg/dL (ref <150)

## 2014-07-29 MED ORDER — ETOMIDATE 2 MG/ML IV SOLN
17.0000 mg | Freq: Once | INTRAVENOUS | Status: AC
  Administered 2014-07-29: 17 mg via INTRAVENOUS

## 2014-07-29 MED ORDER — PROPOFOL 10 MG/ML IV EMUL
INTRAVENOUS | Status: AC
  Filled 2014-07-29: qty 100

## 2014-07-29 MED ORDER — DEXAMETHASONE SODIUM PHOSPHATE 10 MG/ML IJ SOLN
10.0000 mg | Freq: Four times a day (QID) | INTRAMUSCULAR | Status: DC
  Administered 2014-07-29 – 2014-07-30 (×3): 10 mg via INTRAVENOUS
  Filled 2014-07-29 (×2): qty 2.5
  Filled 2014-07-29 (×3): qty 1
  Filled 2014-07-29: qty 2.5
  Filled 2014-07-29: qty 1

## 2014-07-29 MED ORDER — SODIUM CHLORIDE 0.9 % IV SOLN
1.0000 mg/h | INTRAVENOUS | Status: DC
  Administered 2014-07-29 – 2014-07-30 (×2): 1 mg/h via INTRAVENOUS
  Filled 2014-07-29 (×3): qty 5

## 2014-07-29 MED ORDER — LORAZEPAM 2 MG/ML IJ SOLN
INTRAMUSCULAR | Status: AC
  Administered 2014-07-29: 1 mg
  Filled 2014-07-29: qty 1

## 2014-07-29 MED ORDER — DEXAMETHASONE SODIUM PHOSPHATE 10 MG/ML IJ SOLN
10.0000 mg | Freq: Once | INTRAMUSCULAR | Status: AC
  Administered 2014-07-29: 10 mg via INTRAVENOUS
  Filled 2014-07-29: qty 1

## 2014-07-29 MED ORDER — FENTANYL CITRATE 0.05 MG/ML IJ SOLN
INTRAMUSCULAR | Status: AC
  Filled 2014-07-29: qty 8

## 2014-07-29 MED ORDER — PROPOFOL 10 MG/ML IV EMUL
0.0000 ug/kg/min | INTRAVENOUS | Status: DC
  Administered 2014-07-29: 35 ug/kg/min via INTRAVENOUS
  Administered 2014-07-29: 50 ug/kg/min via INTRAVENOUS
  Administered 2014-07-29: 30 ug/kg/min via INTRAVENOUS
  Administered 2014-07-30: 25 ug/kg/min via INTRAVENOUS
  Administered 2014-07-30: 5 ug/kg/min via INTRAVENOUS
  Filled 2014-07-29 (×4): qty 100

## 2014-07-29 MED ORDER — MIDAZOLAM HCL 2 MG/2ML IJ SOLN
INTRAMUSCULAR | Status: AC
  Filled 2014-07-29: qty 4

## 2014-07-29 MED ORDER — CHLORHEXIDINE GLUCONATE 0.12 % MT SOLN
15.0000 mL | Freq: Two times a day (BID) | OROMUCOSAL | Status: DC
  Administered 2014-07-30 – 2014-08-01 (×5): 15 mL via OROMUCOSAL
  Filled 2014-07-29 (×5): qty 15

## 2014-07-29 MED ORDER — RACEPINEPHRINE HCL 2.25 % IN NEBU
INHALATION_SOLUTION | RESPIRATORY_TRACT | Status: AC
Start: 2014-07-29 — End: 2014-07-29
  Administered 2014-07-29: 0.5 mL
  Filled 2014-07-29: qty 0.5

## 2014-07-29 MED ORDER — ROCURONIUM BROMIDE 50 MG/5ML IV SOLN
30.0000 mg | Freq: Once | INTRAVENOUS | Status: AC
  Administered 2014-07-29: 30 mg via INTRAVENOUS
  Filled 2014-07-29: qty 3

## 2014-07-29 MED ORDER — CETYLPYRIDINIUM CHLORIDE 0.05 % MT LIQD
7.0000 mL | Freq: Four times a day (QID) | OROMUCOSAL | Status: DC
  Administered 2014-07-30 – 2014-08-01 (×10): 7 mL via OROMUCOSAL

## 2014-07-29 MED ORDER — MIDAZOLAM HCL 2 MG/2ML IJ SOLN
2.0000 mg | Freq: Once | INTRAMUSCULAR | Status: AC
  Administered 2014-07-29: 2 mg via INTRAVENOUS

## 2014-07-29 MED ORDER — PROPOFOL 10 MG/ML IV BOLUS: 100.0000 mg | Freq: Once | INTRAVENOUS | Status: DC

## 2014-07-29 MED ORDER — FENTANYL CITRATE 0.05 MG/ML IJ SOLN
200.0000 ug | Freq: Once | INTRAMUSCULAR | Status: AC
  Administered 2014-07-29: 200 ug via INTRAVENOUS

## 2014-07-29 MED ORDER — LORAZEPAM 2 MG/ML IJ SOLN
1.0000 mg | Freq: Once | INTRAMUSCULAR | Status: AC
  Administered 2014-07-29: 1 mg via INTRAVENOUS
  Filled 2014-07-29: qty 1

## 2014-07-29 MED ORDER — PROPOFOL 10 MG/ML IV BOLUS: 10.0000 mg | Freq: Once | INTRAVENOUS | Status: DC

## 2014-07-29 NOTE — Progress Notes (Signed)
PULMONARY / CRITICAL CARE MEDICINE    Name: Julie Kerr MRN: [id number] DOB: 12/16/1975    ADMISSION DATE:  07/25/2014  PRIMARY SERVICE: PCCM  CHIEF COMPLAINT:  Dyspnea  BRIEF PATIENT DESCRIPTION: 38 y/o woman w/ hx of asthma who presents with respiratory distress due to significant gap metabolic acidosis. She was intubated, to ICU. Was extubated 12/2 and sent to floor. 12/14 she developed acute onset respiratory distress, transferred to ICU and placed on BiPAP.   SIGNIFICANT EVENTS / STUDIES:  12/11 Intubated in ED 12/12- awake, weaning, extubated 12/13 - to floor, TRH 12/14 - respiratory distress, back to ICU,BiPAP - fully resolved  INTERVAL: resolved in am , this PM severe distress, stridor (on going)  VITAL SIGNS: Temp:  [97.8 F (36.6 C)-98.6 F (37 C)] 98.6 F (37 C) (12/15 1143) Pulse Rate:  [78-118] 82 (12/15 1300) Resp:  [15-26] 20 (12/15 1300) BP: (99-140)/(45-70) 100/47 mmHg (12/15 1300) SpO2:  [98 %-100 %] 98 % (12/15 1300) FiO2 (%):  [35 %-50 %] 35 % (12/15 0125) Weight:  [83.1 kg (183 lb 3.2 oz)] 83.1 kg (183 lb 3.2 oz) (12/15 0440) HEMODYNAMICS:   VENTILATOR SETTINGS: Vent Mode:  [-] BIPAP FiO2 (%):  [35 %-50 %] 35 % Set Rate:  [15 bmp] 15 bmp INTAKE / OUTPUT: Intake/Output      12/14 0701 - 12/15 0700 12/15 0701 - 12/16 0700   P.O.     I.V. (mL/kg) 70 (0.8) 60 (0.7)   IV Piggyback 462.5    Total Intake(mL/kg) 532.5 (6.4) 60 (0.7)   Urine (mL/kg/hr)     Total Output       Net +532.5 +60          PHYSICAL EXAMINATION: General:  Obese woman, in profound distress again, audible stridor Neuro:  Awake, alert, appropriate. Non-focal., anxious HEENT: insp exp loud stridor, wheezing, cant phonate well Cardiovascular:  s1s2 tachy  Lungs:  Poor air movement to lungs Abdomen:  Obese, +bs  Musculoskeletal:  No deformities, no edema   LABS:  CBC  Recent Labs Lab 07/26/14 0050 07/26/14 0257 07/27/14 0235  WBC 18.1* 16.6* 14.5*  HGB 10.6* 10.0*  9.3*  HCT 33.5* 31.5* 29.0*  PLT 282 268 255   Coag's  Recent Labs Lab 07/25/14 1844  APTT 26  INR 1.17   BMET  Recent Labs Lab 07/27/14 0235 07/28/14 0555 07/29/14 0738  NA 142 135* 135*  K 3.3* 4.1 4.1  CL 103 100 101  CO2 27 23 21   BUN 3* 8 9  CREATININE 0.67 0.76 0.65  GLUCOSE 132* 95 219*   Electrolytes  Recent Labs Lab 07/25/14 1844 07/26/14 0257  07/27/14 0235 07/28/14 0555 07/29/14 0738  CALCIUM 8.4 8.6  8.6  < > 8.6 9.3 9.6  MG 2.5 2.0  --   --   --   --   PHOS 2.2* 1.9*  --   --   --   --   < > = values in this interval not displayed. Sepsis Markers  Recent Labs Lab 07/25/14 1844 07/25/14 2032 07/26/14 1007 07/26/14 1700  LATICACIDVEN 3.4* 5.5* 1.4 1.5  PROCALCITON <0.10  --   --   --    ABG  Recent Labs Lab 07/26/14 0637 07/26/14 1120 07/28/14 0420  PHART 7.365 7.378 7.413  PCO2ART 26.0* 36.4 36.0  PO2ART 152.0* 173.0* 103.0*   Liver Enzymes  Recent Labs Lab 07/25/14 1844 07/26/14 0257  AST 11 15  ALT 10 11  ALKPHOS 58 55  BILITOT 0.2* <0.2*  ALBUMIN 3.4* 3.2*   Cardiac Enzymes  Recent Labs Lab 07/25/14 1718 07/25/14 1844  07/29/14 0042 07/29/14 0738 07/29/14 1230  TROPONINI  --  <0.30  < > <0.30 <0.30 <0.30  PROBNP <5.0 <5.0  --   --   --   --   < > = values in this interval not displayed. Glucose  Recent Labs Lab 07/28/14 0913 07/28/14 1230 07/28/14 1642 07/29/14 0014 07/29/14 0417 07/29/14 0801  GLUCAP 207* 149* 133* 166* 143* 229*    Imaging Dg Chest Port 1 View  07/28/2014   CLINICAL DATA:  Shortness of breath.  EXAM: PORTABLE CHEST - 1 VIEW  COMPARISON:  07/27/2014.  FINDINGS: Mediastinum and hilar structures are normal. Persistent mild bibasilar atelectasis and/or infiltrates. Stable cardiomegaly with normal pulmonary vascularity. Small pleural effusions cannot be excluded. No pneumothorax. No acute osseus abnormality.  IMPRESSION: Persistent mild bibasilar atelectasis and/or infiltrates.  Associated small pleural effusions cannot be excluded. Chest is stable from prior exam.   Electronically Signed   By: Maisie Fushomas  Register   On: 07/28/2014 07:43   Dg Chest Port 1 View  07/27/2014   CLINICAL DATA:  Fever, cough.  Chest pain.  EXAM: PORTABLE CHEST - 1 VIEW  COMPARISON:  Same day.  FINDINGS: The heart size and mediastinal contours are within normal limits. No pneumothorax is noted. Stable mild bibasilar opacities are noted concerning for pneumonia or atelectasis with associated pleural effusions. The visualized skeletal structures are unremarkable.  IMPRESSION: Stable mild bibasilar opacities are noted concerning for pneumonia or atelectasis with associated pleural effusions.   Electronically Signed   By: Roque LiasJames  Green M.D.   On: 07/27/2014 23:07     ASSESSMENT / PLAN:  PULMONARY ETT 12/11>>>12/12 Acute respiratory faiulre - ?asthma exacerbation although no sig bronchospasm noted. VCD vs stridor. Suspect VCD given rapid onset and 2 days post extubation.  reoccurrence severe 12/15 Significant atx post extubation.  Hx asthma   P:   No role BIPAP Racemic epi x 1, decadron x 1 - requested by pt and wife prior to intubation, I stressed should proceed to intubation NOW Scheduled BD's Aggressive pulm hygiene  Consider CT neck in AM after intubation to 8 cc/kg 100% peep 5 Steroids to continue CXR now abg to follow May need trach  CARDIOVASCULAR A: Sinus Tachycardia - ?agitation v opiate withdrawal.  Troponins negative, EKG ok.  BNP neg.  QTC prolongation - 610 noted 12/11 ECG - now wnl  P:   Tele Treat airway control  RENAL A: Hypokalemia resolved   P:   F/u chem  Impression is a tox -cocaine, vasoconstriction  GASTROINTESTINAL A: No acute issues; keep NPO overnight  P:   PPI  NPO  HEMATOLOGIC A: Leukocytosis - Favor stress and steroid induced.  P:   F/u cbc  Lovenox restart  INFECTIOUS A: Concern for PNA with fevers 12/14, ddx includes ATX. ABX  [facility]   P:   BCx2 12/11>>> Urine 12/11>>> Neg Abx: Vanc, start date 12/14, day 1 Abx: Zosyn, start date 12/14, day 1 Follow WBC and fevers pcxr to follow  ENDOCRINE A: Hyperglycemic  P:   SSI q4. Decadron restart  NEUROLOGIC A: Metabolic encephelopathy- resolved Possible opiate withdrawal Possible toxic ingestion  P:   RASS goal= 0 After intubation, use propofol dilauded  Updated wife CC time by me 60 minutes  Mcarthur Rossettianiel J. Tyson AliasFeinstein, MD, FACP Pgr: [phone] [facility]

## 2014-07-29 NOTE — Progress Notes (Signed)
Patient's significant other Luellen PuckerLarryssa present at bedside. Updated, pleasant, no further questions or requests at this time.

## 2014-07-29 NOTE — Procedures (Signed)
Intubation Procedure Note Julie Kerr [id number] 10/14/1975  Procedure: Intubation Indications: Respiratory insufficiency  Procedure Details Consent: Risks of procedure as well as the alternatives and risks of each were explained to the (patient/caregiver).  Consent for procedure obtained. Time Out: Verified patient identification, verified procedure, site/side was marked, verified correct patient position, special equipment/implants available, medications/allergies/relevent history reviewed, required imaging and test results available.  Performed  Maximum sterile technique was used including gown, hand hygiene and mask.  MAC and 3    Evaluation Hemodynamic Status: BP stable throughout; O2 sats: stable throughout Patient's Current Condition: stable Complications: No apparent complications Patient did tolerate procedure well. Chest X-ray ordered to verify placement.  CXR: pending.   Julie Kerr. 07/29/2014  No cord edema likley severe spasm / dysfxn prior to sedation

## 2014-07-30 ENCOUNTER — Inpatient Hospital Stay (HOSPITAL_COMMUNITY): Payer: Medicaid Other

## 2014-07-30 DIAGNOSIS — E872 Acidosis: Secondary | ICD-10-CM

## 2014-07-30 DIAGNOSIS — F1910 Other psychoactive substance abuse, uncomplicated: Secondary | ICD-10-CM

## 2014-07-30 LAB — URINALYSIS, ROUTINE W REFLEX MICROSCOPIC
Bilirubin Urine: NEGATIVE
Glucose, UA: NEGATIVE mg/dL
HGB URINE DIPSTICK: NEGATIVE
Ketones, ur: NEGATIVE mg/dL
Leukocytes, UA: NEGATIVE
Nitrite: NEGATIVE
PROTEIN: NEGATIVE mg/dL
Specific Gravity, Urine: 1.018 (ref 1.005–1.030)
UROBILINOGEN UA: 0.2 mg/dL (ref 0.0–1.0)
pH: 7 (ref 5.0–8.0)

## 2014-07-30 LAB — APTT: APTT: 27 s (ref 24–37)

## 2014-07-30 LAB — BASIC METABOLIC PANEL
Anion gap: 18 — ABNORMAL HIGH (ref 5–15)
BUN: 13 mg/dL (ref 6–23)
CHLORIDE: 101 meq/L (ref 96–112)
CO2: 20 mEq/L (ref 19–32)
Calcium: 9.8 mg/dL (ref 8.4–10.5)
Creatinine, Ser: 0.75 mg/dL (ref 0.50–1.10)
GFR calc Af Amer: >90 mL/min (ref >90)
GFR calc non Af Amer: >90 mL/min (ref >90)
GLUCOSE: 117 mg/dL — AB (ref 70–99)
POTASSIUM: 4.2 meq/L (ref 3.7–5.3)
SODIUM: 139 meq/L (ref 137–147)

## 2014-07-30 LAB — GLUCOSE, CAPILLARY
GLUCOSE-CAPILLARY: 105 mg/dL — AB (ref 70–99)
GLUCOSE-CAPILLARY: 113 mg/dL — AB (ref 70–99)
GLUCOSE-CAPILLARY: 95 mg/dL (ref 70–99)
GLUCOSE-CAPILLARY: 86 mg/dL (ref 70–99)
Glucose-Capillary: 117 mg/dL — ABNORMAL HIGH (ref 70–99)
Glucose-Capillary: 121 mg/dL — ABNORMAL HIGH (ref 70–99)

## 2014-07-30 LAB — PHOSPHORUS
PHOSPHORUS: 5 mg/dL — AB (ref 2.3–4.6)
Phosphorus: 3.7 mg/dL (ref 2.3–4.6)

## 2014-07-30 LAB — MAGNESIUM
MAGNESIUM: 2.3 mg/dL (ref 1.5–2.5)
MAGNESIUM: 2.3 mg/dL (ref 1.5–2.5)

## 2014-07-30 LAB — PROTIME-INR
INR: 1.06 (ref 0.00–1.49)
PROTHROMBIN TIME: 14 s (ref 11.6–15.2)

## 2014-07-30 MED ORDER — VITAL HIGH PROTEIN PO LIQD
1000.0000 mL | ORAL | Status: DC
  Administered 2014-07-30: 1000 mL
  Administered 2014-07-31: 08:00:00
  Administered 2014-07-31: 1000 mL
  Administered 2014-08-01 (×4)
  Administered 2014-08-01: 1000 mL
  Administered 2014-08-01 (×5)
  Filled 2014-07-30 (×5): qty 1000

## 2014-07-30 MED ORDER — MIDAZOLAM HCL 2 MG/2ML IJ SOLN
4.0000 mg | Freq: Once | INTRAMUSCULAR | Status: AC
  Administered 2014-07-31: 4 mg via INTRAVENOUS
  Filled 2014-07-30: qty 4

## 2014-07-30 MED ORDER — PRO-STAT SUGAR FREE PO LIQD
30.0000 mL | Freq: Two times a day (BID) | ORAL | Status: AC
  Administered 2014-07-30 – 2014-07-31 (×2): 30 mL
  Filled 2014-07-30 (×2): qty 30

## 2014-07-30 MED ORDER — ETOMIDATE 2 MG/ML IV SOLN
40.0000 mg | Freq: Once | INTRAVENOUS | Status: AC
  Administered 2014-07-31: 40 mg via INTRAVENOUS
  Filled 2014-07-30: qty 20

## 2014-07-30 MED ORDER — PRO-STAT SUGAR FREE PO LIQD
30.0000 mL | Freq: Two times a day (BID) | ORAL | Status: DC
  Filled 2014-07-30 (×2): qty 30

## 2014-07-30 MED ORDER — PROPOFOL 10 MG/ML IV EMUL: 5.0000 ug/kg/min | Freq: Once | INTRAVENOUS | Status: DC

## 2014-07-30 MED ORDER — WHITE PETROLATUM GEL
Status: AC
  Administered 2014-07-30: 0.2
  Filled 2014-07-30: qty 5

## 2014-07-30 MED ORDER — FENTANYL CITRATE 0.05 MG/ML IJ SOLN: 200.0000 ug | Freq: Once | INTRAMUSCULAR | Status: DC

## 2014-07-30 MED ORDER — VITAL HIGH PROTEIN PO LIQD
1000.0000 mL | ORAL | Status: DC
  Filled 2014-07-30: qty 1000

## 2014-07-30 MED ORDER — VECURONIUM BROMIDE 10 MG IV SOLR
10.0000 mg | Freq: Once | INTRAVENOUS | Status: AC
  Administered 2014-07-31: 10 mg via INTRAVENOUS
  Filled 2014-07-30: qty 10

## 2014-07-30 MED ORDER — VITAL HIGH PROTEIN PO LIQD
1000.0000 mL | ORAL | Status: DC
  Filled 2014-07-30 (×2): qty 1000

## 2014-07-30 NOTE — Progress Notes (Signed)
Pt placed back on full vent support d/t low RR, low min volume.  Full vent support vent settings changed per MD order.  PT tol change well, no resp distress noted.  VSS.

## 2014-07-30 NOTE — Progress Notes (Signed)
NUTRITION FOLLOW UP / CONSULT  Intervention:    Initiate TF via OGT with Vital High Protein at 25 ml/h and Prostat 30 ml BID on day 1; on day 2, d/c Prostat and increase to goal rate of 55 ml/h (1320 ml per day) to provide 1320 kcals (25 kcals/kg ideal weight), 116 gm protein, 1104 ml free water daily.  Nutrition Dx:   Inadequate oral intake related to inability to eat as evidenced by NPO status. Ongoing.  Goal:   Enteral nutrition to provide 60-70% of estimated calorie needs (22-25 kcals/kg ideal body weight) and 100% of estimated protein needs, based on ASPEN guidelines for hypocaloric, high protein feeding in critically ill obese individuals. Unmet.  Monitor:   TF tolerance/adequacy, weight trend, labs, vent status.  Assessment:   38 y/o woman w/ hx of asthma who presents with respiratory distress due to significant gap metabolic acidosis.  Discussed patient in ICU rounds today. Patient was extubated on 12/12. Developed respiratory distress and re-intubated on 12/15. Plans for tracheostomy tomorrow. Received MD Consult for TF initiation and management.  Patient is currently intubated on ventilator support MV: 10.1 L/min Temp (24hrs), Avg:98.6 F (37 C), Min:97.6 F (36.4 C), Max:99.6 F (37.6 C)  Propofol: to remain off for now per RN  Height: Ht Readings from Last 1 Encounters:  07/25/14 5\' 3"  (1.6 m)    Weight Status:   Wt Readings from Last 1 Encounters:  07/30/14 180 lb 5.4 oz (81.8 kg)   07/26/14 182 lb 8.7 oz (82.8 kg)  07/25/14 176 lb 12.9 oz (80.2 kg)  Re-estimated needs:  Kcal: 1785 Protein: 105-120 gm Fluid: 1.8-2 L  Skin: WDL  Diet Order: Diet NPO time specified Diet NPO time specified   Intake/Output Summary (Last 24 hours) at 07/30/14 0945 Last data filed at 07/30/14 0600  Gross per 24 hour  Intake 520.39 ml  Output    875 ml  Net -354.61 ml    Last BM: 12/9   Labs:   Recent Labs Lab 07/25/14 1844  07/26/14 0257  07/28/14 0555  07/29/14 0738 07/30/14 0235  NA 138  --  140  141  < > 135* 135* 139  K 2.6*  --  4.1  4.0  < > 4.1 4.1 4.2  CL 103  --  108  109  < > 100 101 101  CO2 18*  --  10*  10*  < > 23 21 20   BUN 9  --  8  8  < > 8 9 13   CREATININE 0.67  < > 0.77  0.83  < > 0.76 0.65 0.75  CALCIUM 8.4  --  8.6  8.6  < > 9.3 9.6 9.8  MG 2.5  --  2.0  --   --   --   --   PHOS 2.2*  --  1.9*  --   --   --   --   GLUCOSE 195*  --  198*  195*  < > 95 219* 117*  < > = values in this interval not displayed.  CBG (last 3)   Recent Labs  07/30/14 0018 07/30/14 0420 07/30/14 0817  GLUCAP 121* 117* 105*    Scheduled Meds: . antiseptic oral rinse  7 mL Mouth Rinse QID  . chlorhexidine  15 mL Mouth Rinse BID  . enoxaparin (LOVENOX) injection  40 mg Subcutaneous Q24H  . etomidate  40 mg Intravenous Once  . feeding supplement (PRO-STAT SUGAR FREE 64)  30 mL  Per Tube BID  . feeding supplement (VITAL HIGH PROTEIN)  1,000 mL Per Tube Q24H  . fentaNYL  50 mcg Transdermal Q72H  . fentaNYL  200 mcg Intravenous Once  . gabapentin  300 mg Oral BID   And  . gabapentin  200 mg Oral Q24H  . guaiFENesin  1,200 mg Oral BID  . insulin aspart  0-20 Units Subcutaneous 6 times per day  . ipratropium  0.5 mg Nebulization Q6H  . levalbuterol  0.63 mg Nebulization Q6H  . midazolam  4 mg Intravenous Once  . multivitamin with minerals  1 tablet Oral Daily  . pantoprazole  40 mg Oral QHS  . propofol  5-80 mcg/kg/min Intravenous Once  . vecuronium  10 mg Intravenous Once    Continuous Infusions: . HYDROmorphone 1 mg/hr (07/29/14 1614)  . propofol 25 mcg/kg/min (07/30/14 0523)    Joaquin CourtsKimberly Ricky Gallery, RD, LDN, CNSC Pager [phone] After Hours Pager [phone]

## 2014-07-30 NOTE — Progress Notes (Signed)
Patient has not voided since mid-afternoon yesterday. Bladder scan >600 mL. Received order for urinary catheter from Dr. Katina DegreeYakoub, urine returned per I&O flowsheet.

## 2014-07-30 NOTE — Progress Notes (Signed)
Pt transported to/from CT scan via vent, no apparent complications noted.

## 2014-07-30 NOTE — Progress Notes (Signed)
Patient restless on IV dilaudid for pain, complaining of generalized pain 9/10. Propofol restarted, patient to be trached in the AM.  Corliss SkainsJuan Harpreet Pompey RN

## 2014-07-30 NOTE — Progress Notes (Signed)
PULMONARY / CRITICAL CARE MEDICINE    Name: Julie Kerr MRN: [id number] DOB: 05/16/1976    ADMISSION DATE:  07/25/2014  PRIMARY SERVICE: PCCM  CHIEF COMPLAINT:  Dyspnea  BRIEF PATIENT DESCRIPTION: 38 y/o woman w/ hx of asthma who presents with respiratory distress due to significant gap metabolic acidosis. She was intubated, to ICU. Was extubated 12/2 and sent to floor. 12/14 she developed acute onset respiratory distress, transferred to ICU and placed on BiPAP.   SIGNIFICANT EVENTS / STUDIES:  12/11 Intubated in ED 12/12- awake, weaning, extubated 12/13 - to floor, TRH 12/14 - respiratory distress, back to ICU,BiPAP - fully resolved 12/15- re intubated  INTERVAL:  Remained calm on vent  VITAL SIGNS: Temp:  [97.6 F (36.4 C)-99.6 F (37.6 C)] 97.6 F (36.4 C) (12/16 0419) Pulse Rate:  [60-118] 84 (12/16 0600) Resp:  [17-32] 20 (12/16 0600) BP: (87-127)/(45-93) 92/51 mmHg (12/16 0500) SpO2:  [98 %-100 %] 100 % (12/16 0600) FiO2 (%):  [50 %-100 %] 50 % (12/16 0600) Weight:  [81.8 kg (180 lb 5.4 oz)] 81.8 kg (180 lb 5.4 oz) (12/16 0500) HEMODYNAMICS:   VENTILATOR SETTINGS: Vent Mode:  [-] PRVC FiO2 (%):  [50 %-100 %] 50 % Set Rate:  [20 bmp] 20 bmp Vt Set:  [500 mL] 500 mL PEEP:  [5 cmH20] 5 cmH20 Plateau Pressure:  [18 cmH20-21 cmH20] 21 cmH20 INTAKE / OUTPUT: Intake/Output      12/15 0701 - 12/16 0700 12/16 0701 - 12/17 0700   I.V. (mL/kg) 440.4 (5.4)    IV Piggyback 100    Total Intake(mL/kg) 540.4 (6.6)    Urine (mL/kg/hr) 875 (0.4)    Total Output 875     Net -334.6            PHYSICAL EXAMINATION: General:  Obese woman, calm Neuro:  rass 0, nonfocal HEENT: jvd wnl Cardiovascular:  s1s2 rrr  Lungs:  CTA Abdomen:  Obese, +bs, no r/g Musculoskeletal:  No deformities, no edema   LABS:  CBC  Recent Labs Lab 07/26/14 0050 07/26/14 0257 07/27/14 0235  WBC 18.1* 16.6* 14.5*  HGB 10.6* 10.0* 9.3*  HCT 33.5* 31.5* 29.0*  PLT 282 268 255    Coag's  Recent Labs Lab 07/25/14 1844  APTT 26  INR 1.17   BMET  Recent Labs Lab 07/28/14 0555 07/29/14 0738 07/30/14 0235  NA 135* 135* 139  K 4.1 4.1 4.2  CL 100 101 101  CO2 23 21 20   BUN 8 9 13   CREATININE 0.76 0.65 0.75  GLUCOSE 95 219* 117*   Electrolytes  Recent Labs Lab 07/25/14 1844 07/26/14 0257  07/28/14 0555 07/29/14 0738 07/30/14 0235  CALCIUM 8.4 8.6  8.6  < > 9.3 9.6 9.8  MG 2.5 2.0  --   --   --   --   PHOS 2.2* 1.9*  --   --   --   --   < > = values in this interval not displayed. Sepsis Markers  Recent Labs Lab 07/25/14 1844 07/25/14 2032 07/26/14 1007 07/26/14 1700  LATICACIDVEN 3.4* 5.5* 1.4 1.5  PROCALCITON <0.10  --   --   --    ABG  Recent Labs Lab 07/26/14 0637 07/26/14 1120 07/28/14 0420  PHART 7.365 7.378 7.413  PCO2ART 26.0* 36.4 36.0  PO2ART 152.0* 173.0* 103.0*   Liver Enzymes  Recent Labs Lab 07/25/14 1844 07/26/14 0257  AST 11 15  ALT 10 11  ALKPHOS 58 55  BILITOT 0.2* <0.2*  ALBUMIN 3.4* 3.2*   Cardiac Enzymes  Recent Labs Lab 07/25/14 1718 07/25/14 1844  07/29/14 0042 07/29/14 0738 07/29/14 1230  TROPONINI  --  <0.30  < > <0.30 <0.30 <0.30  PROBNP <5.0 <5.0  --   --   --   --   < > = values in this interval not displayed. Glucose  Recent Labs Lab 07/29/14 0801 07/29/14 1142 07/29/14 1549 07/29/14 1927 07/30/14 0018 07/30/14 0420  GLUCAP 229* 109* 108* 112* 121* 117*    Imaging Dg Chest Port 1 View  07/29/2014   CLINICAL DATA:  Acute respiratory failure, check nasogastric catheter  EXAM: PORTABLE CHEST - 1 VIEW  COMPARISON:  07/28/2014  FINDINGS: Cardiac shadow is within normal limits. A nasogastric catheter is noted coiled within the stomach. Endotracheal tube is seen approximately the 5 cm above the carina. The lungs are well aerated bilaterally. Very minimal right basilar atelectasis is noted.  IMPRESSION: Tubes and lines as described above.  Mild right basilar atelectasis.    Electronically Signed   By: Alcide CleverMark  Lukens M.D.   On: 07/29/2014 16:03     ASSESSMENT / PLAN:  PULMONARY ETT 12/11>>>12/12 Acute respiratory faiulre  THIS IS SEVERE VCD Significant atx post extubation.  Hx asthma   P:   Will ct neck Will plan trach after d/w pt, multiple intubations Wean cpap5 ps 5, goal 2 hr, no extubation planned Dc decadron limited benefit, cords wnl  CARDIOVASCULAR A: QTC prolongation - resolved Coc abuse P:   Tele Allow pos balance  RENAL A: Hypokalemia resolved   P:   F/u chem  kvo  GASTROINTESTINAL A: No acute issues  P:   PPI  NPO, restart T F Npo 5 am for trach 130 pm  HEMATOLOGIC A: Leukocytosis - Favor stress and steroid induced.  P:   F/u cbc  Lovenox OK coags today  INFECTIOUS A: No evidence infection  P:   BCx2 12/11>>> Urine 12/11>>> Neg Abx: Vanc, start date 12/14>>>1215 Abx: Zosyn, start date 12/14>>>12/16  No  Infection noted, dc all abx  ENDOCRINE A: Hyperglycemic  P:   SSI q4. Decadron dc  NEUROLOGIC A: Metabolic encephelopathy- resolved Possible opiate withdrawal Possible toxic ingestion coc abuse  P:   RASS goal= 0 WUA on propofol dilauded  CCM 30 min  Mcarthur Rossettianiel J. Tyson AliasFeinstein, MD, FACP Pgr: [phone] [place] [facility]

## 2014-07-30 NOTE — Progress Notes (Signed)
Patient alert and oriented, communicating via writing notes. Appropriate safety awareness. Protective mitts removed.

## 2014-07-31 ENCOUNTER — Inpatient Hospital Stay (HOSPITAL_COMMUNITY): Payer: Medicaid Other

## 2014-07-31 DIAGNOSIS — J9620 Acute and chronic respiratory failure, unspecified whether with hypoxia or hypercapnia: Secondary | ICD-10-CM

## 2014-07-31 DIAGNOSIS — J9690 Respiratory failure, unspecified, unspecified whether with hypoxia or hypercapnia: Secondary | ICD-10-CM | POA: Insufficient documentation

## 2014-07-31 LAB — GLUCOSE, CAPILLARY
GLUCOSE-CAPILLARY: 109 mg/dL — AB (ref 70–99)
GLUCOSE-CAPILLARY: 84 mg/dL (ref 70–99)
GLUCOSE-CAPILLARY: 99 mg/dL (ref 70–99)
Glucose-Capillary: 103 mg/dL — ABNORMAL HIGH (ref 70–99)
Glucose-Capillary: 79 mg/dL (ref 70–99)
Glucose-Capillary: 106 mg/dL — ABNORMAL HIGH (ref 70–99)

## 2014-07-31 LAB — MAGNESIUM
Magnesium: 2.2 mg/dL (ref 1.5–2.5)
Magnesium: 2.1 mg/dL (ref 1.5–2.5)

## 2014-07-31 LAB — BASIC METABOLIC PANEL
ANION GAP: 13 (ref 5–15)
BUN: 20 mg/dL (ref 6–23)
CHLORIDE: 103 meq/L (ref 96–112)
CO2: 24 mEq/L (ref 19–32)
Calcium: 9.4 mg/dL (ref 8.4–10.5)
Creatinine, Ser: 0.68 mg/dL (ref 0.50–1.10)
GFR calc non Af Amer: >90 mL/min (ref >90)
Glucose, Bld: 98 mg/dL (ref 70–99)
Potassium: 3.5 mEq/L — ABNORMAL LOW (ref 3.7–5.3)
Sodium: 140 mEq/L (ref 137–147)

## 2014-07-31 LAB — CBC WITH DIFFERENTIAL/PLATELET
BASOS PCT: 0 % (ref 0–1)
Basophils Absolute: 0 10*3/uL (ref 0.0–0.1)
Eosinophils Absolute: 0 10*3/uL (ref 0.0–0.7)
Eosinophils Relative: 0 % (ref 0–5)
HCT: 31.1 % — ABNORMAL LOW (ref 36.0–46.0)
Hemoglobin: 9.6 g/dL — ABNORMAL LOW (ref 12.0–15.0)
Lymphocytes Relative: 19 % (ref 12–46)
Lymphs Abs: 2.5 10*3/uL (ref 0.7–4.0)
MCH: 24.5 pg — AB (ref 26.0–34.0)
MCHC: 30.9 g/dL (ref 30.0–36.0)
MCV: 79.3 fL (ref 78.0–100.0)
MONOS PCT: 8 % (ref 3–12)
Monocytes Absolute: 1 10*3/uL (ref 0.1–1.0)
NEUTROS ABS: 9.7 10*3/uL — AB (ref 1.7–7.7)
NEUTROS PCT: 73 % (ref 43–77)
PLATELETS: 308 10*3/uL (ref 150–400)
RBC: 3.92 MIL/uL (ref 3.87–5.11)
RDW: 14.7 % (ref 11.5–15.5)
WBC: 13.2 10*3/uL — ABNORMAL HIGH (ref 4.0–10.5)

## 2014-07-31 LAB — PHOSPHORUS
PHOSPHORUS: 3.1 mg/dL (ref 2.3–4.6)
Phosphorus: 2.8 mg/dL (ref 2.3–4.6)

## 2014-07-31 MED ORDER — POTASSIUM CHLORIDE 20 MEQ/15ML (10%) PO SOLN
20.0000 meq | ORAL | Status: AC
  Administered 2014-07-31 (×2): 20 meq
  Filled 2014-07-31 (×2): qty 15

## 2014-07-31 MED ORDER — ETOMIDATE 2 MG/ML IV SOLN
20.0000 mg | Freq: Once | INTRAVENOUS | Status: AC
  Administered 2014-07-31: 20 mg via INTRAVENOUS

## 2014-07-31 MED ORDER — HYDROMORPHONE BOLUS VIA INFUSION
4.0000 mg | Freq: Once | INTRAVENOUS | Status: AC
  Administered 2014-07-31: 4 mg via INTRAVENOUS
  Filled 2014-07-31: qty 4

## 2014-07-31 MED ORDER — POLYETHYLENE GLYCOL 3350 17 G PO PACK
17.0000 g | PACK | Freq: Every day | ORAL | Status: DC
  Administered 2014-07-31 – 2014-08-01 (×2): 17 g via ORAL
  Filled 2014-07-31 (×2): qty 1

## 2014-07-31 MED ORDER — LORAZEPAM 1 MG PO TABS
1.0000 mg | ORAL_TABLET | Freq: Two times a day (BID) | ORAL | Status: DC
  Administered 2014-07-31 – 2014-08-01 (×3): 1 mg via ORAL
  Filled 2014-07-31 (×3): qty 1

## 2014-07-31 MED ORDER — MIDAZOLAM HCL 2 MG/2ML IJ SOLN
6.0000 mg | Freq: Once | INTRAMUSCULAR | Status: AC
  Administered 2014-07-31: 6 mg via INTRAVENOUS

## 2014-07-31 MED ORDER — GUAIFENESIN 100 MG/5ML PO SOLN
30.0000 mL | Freq: Four times a day (QID) | ORAL | Status: DC
  Administered 2014-07-31 – 2014-08-01 (×5): 600 mg
  Filled 2014-07-31 (×12): qty 30

## 2014-07-31 MED ORDER — DOCUSATE SODIUM 50 MG/5ML PO LIQD
100.0000 mg | Freq: Two times a day (BID) | ORAL | Status: DC
  Administered 2014-07-31 – 2014-08-02 (×5): 100 mg via ORAL
  Filled 2014-07-31 (×6): qty 10

## 2014-07-31 MED ORDER — MIDAZOLAM HCL 2 MG/2ML IJ SOLN
INTRAMUSCULAR | Status: AC
  Filled 2014-07-31: qty 2

## 2014-07-31 MED ORDER — LORAZEPAM 2 MG/ML PO CONC: 1.0000 mg | Freq: Two times a day (BID) | ORAL | Status: DC

## 2014-07-31 MED ORDER — HYDROMORPHONE HCL 1 MG/ML IJ SOLN
0.5000 mg | INTRAMUSCULAR | Status: DC | PRN
  Administered 2014-07-31 – 2014-08-01 (×5): 2 mg via INTRAVENOUS
  Filled 2014-07-31 (×5): qty 2

## 2014-07-31 MED ORDER — MIDAZOLAM HCL 2 MG/2ML IJ SOLN
INTRAMUSCULAR | Status: AC
  Filled 2014-07-31: qty 6

## 2014-07-31 MED ORDER — ETOMIDATE 2 MG/ML IV SOLN
INTRAVENOUS | Status: AC
  Filled 2014-07-31: qty 10

## 2014-07-31 MED ORDER — PROPOFOL 10 MG/ML IV BOLUS
100.0000 mg | Freq: Once | INTRAVENOUS | Status: AC
  Administered 2014-07-31: 100 mg via INTRAVENOUS

## 2014-07-31 NOTE — Procedures (Signed)
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 CONSENT: obtained PREMEDS: see flow sheets  PROCEDURE: performed with bronchoscopic assistance by Dr Tyson AliasFeinstein Patient placed in proper position with shoulder roll  Landmarks identified Area prepped and draped in sterile fashion 1% lidocaine with epi infused liberally with effective blanching  1 cm vertical incision Hemostasis achieved Blunt dissection down to trachea Trachea entered with 14 gauge introducer needle and guide wire advanced under bronchoscopic visualization Punch dilator advanced over wire and removed 28 FR dilator advanced over wire and removed #6 cuffed Shiley over graduated dilator advanced over wire  Trach tube position confirmed with bronchoscopy Trach tube sutured in place  COMPLICATIONS: none. Procedure was well tolerated. CXR pending EBL: minimal   Julie Fischeravid Katerin Negrete, MD;  PCCM service; Mobile [phone]

## 2014-07-31 NOTE — Progress Notes (Signed)
[facility] LAB REPLACEMENT ONLY  The patient does apply for the [facility] Protocol based on the criteria listed below:   1. Is GFR >/= 40 ml/min? Yes.    Patient's GFR today is >90 2. Is urine output >/= 0.5 ml/kg/hr for the last 6 hours? Yes.   Patient's UOP is 0.6 ml/kg/hr 3. Is BUN < 60 mg/dL? Yes.    Patient's BUN today is 20 4. Abnormal electrolyte(s):K3.5 5. Ordered repletion with: kcl-tube 6. If a panic level lab has been reported, has the CCM MD in charge been notified? Yes.  .   Physician:  Irene PapW Yacoub,MD  Nyrie Sigal William 07/31/2014 5:02 AM

## 2014-07-31 NOTE — Progress Notes (Signed)
Dilauded discontinued at 2100. Wasted remainder in bag- 25mL. Lasandra BeechAyana Smith RN witness.

## 2014-07-31 NOTE — Procedures (Signed)
Bronchoscopy  for Percutaneous  Tracheostomy  Name: Julie Kerr MRN: [id number] DOB: 06/07/1976 Procedure: Bronchoscopy for Percutaneous Tracheostomy Indications: Diagnostic evaluation of the airways In conjunction with: Dr. Sung AmabileSimonds  Procedure Details Consent: Risks of procedure as well as the alternatives and risks of each were explained to the (patient/caregiver).  Consent for procedure obtained. Time Out: Verified patient identification, verified procedure, site/side was marked, verified correct patient position, special equipment/implants available, medications/allergies/relevent history reviewed, required imaging and test results available.  Performed  In preparation for procedure, patient was given 100% FiO2 and bronchoscope lubricated. Sedation: Benzodiazepines, Muscle relaxants and Etomidate  Airway entered and the following bronchi were examined: RLL.   Procedures performed: Endotracheal Tube retracted in 2 cm increments. Cannulation of airway observed. Dilation observed. Placement of trachel tube  observed . No overt complications. Bronchoscope removed.    Evaluation Hemodynamic Status: BP stable throughout; O2 sats: stable throughout Patient's Current Condition: stable Specimens:  None Complications: No apparent complications Patient did tolerate procedure well.   Brett CanalesSteve Minor ACNP [facility]Le [facility] PCCM Pager [phone] till 3 pm If no answer page [phone] 07/31/2014, 2:02 PM   performed with above Min bleeding, noted all parts of procedures 6 placed, well above carina  Mcarthur Rossettianiel J. Tyson AliasFeinstein, MD, FACP Pgr: [phone] [facility]

## 2014-07-31 NOTE — Progress Notes (Signed)
eLink Physician-Brief Progress Note Patient Name: Julie Kerr DOB: 01/10/1976 MRN: [id number]   Date of Service  07/31/2014  HPI/Events of Note  Trach today for recurrent upper airway variable obstruction from VCD Little if any lung disease Currently on full vent support > unecessary  eICU Interventions  D/c dilaudid gtt Wean vent to trach collar now     Intervention Category Minor Interventions: Routine modifications to care plan (e.g. PRN medications for pain, fever)  MCQUAID, DOUGLAS 07/31/2014, 8:36 PM

## 2014-07-31 NOTE — Progress Notes (Addendum)
PULMONARY / CRITICAL CARE MEDICINE    Name: Julie Kerr MRN: [id number] DOB: 11/28/1975    ADMISSION DATE:  07/25/2014  PRIMARY SERVICE: PCCM  CHIEF COMPLAINT:  Dyspnea  BRIEF PATIENT DESCRIPTION: 38 y/o woman w/ hx of asthma who presents with respiratory distress due to significant gap metabolic acidosis. She was intubated, to ICU. Was extubated 12/2 and sent to floor. 12/14 she developed acute onset respiratory distress, transferred to ICU and placed on BiPAP.   SIGNIFICANT EVENTS / STUDIES:  12/11 Intubated in ED 12/12- awake, weaning, extubated 12/13 - to floor, TRH 12/14 - respiratory distress, back to ICU,BiPAP - fully resolved 12/15- re intubated 12/16- CT neck noncontrast>>neg  INTERVAL:  CT neg  VITAL SIGNS: Temp:  [98.1 F (36.7 C)-98.8 F (37.1 C)] 98.8 F (37.1 C) (12/17 1212) Pulse Rate:  [59-86] 64 (12/17 1200) Resp:  [13-22] 21 (12/17 1200) BP: (83-133)/(41-91) 90/50 mmHg (12/17 1200) SpO2:  [100 %] 100 % (12/17 1200) FiO2 (%):  [40 %] 40 % (12/17 1112) Weight:  [84.2 kg (185 lb 10 oz)] 84.2 kg (185 lb 10 oz) (12/17 0500) HEMODYNAMICS:   VENTILATOR SETTINGS: Vent Mode:  [-] PRVC FiO2 (%):  [40 %] 40 % Set Rate:  [16 bmp] 16 bmp Vt Set:  [420 mL] 420 mL PEEP:  [5 cmH20] 5 cmH20 Plateau Pressure:  [11 cmH20-20 cmH20] 11 cmH20 INTAKE / OUTPUT: Intake/Output      12/16 0701 - 12/17 0700 12/17 0701 - 12/18 0700   I.V. (mL/kg) 536.8 (6.4) 10 (0.1)   NG/GT 408.3    IV Piggyback     Total Intake(mL/kg) 945.1 (11.2) 10 (0.1)   Urine (mL/kg/hr) 1255 (0.6) 175 (0.4)   Total Output 1255 175   Net -309.9 -165          PHYSICAL EXAMINATION: General:  Obese woman, calm Neuro:  rass 1, nonfocal HEENT: jvd wnl Cardiovascular:  s1s2 rrr  Lungs:  CTA Abdomen:  Obese, +bs, no r/g Musculoskeletal:  No deformities, no edema   LABS:  CBC  Recent Labs Lab 07/26/14 0257 07/27/14 0235 07/31/14 0213  WBC 16.6* 14.5* 13.2*  HGB 10.0* 9.3* 9.6*  HCT  31.5* 29.0* 31.1*  PLT 268 255 308   Coag's  Recent Labs Lab 07/25/14 1844 07/30/14 1010  APTT 26 27  INR 1.17 1.06   BMET  Recent Labs Lab 07/29/14 0738 07/30/14 0235 07/31/14 0213  NA 135* 139 140  K 4.1 4.2 3.5*  CL 101 101 103  CO2 21 20 24   BUN 9 13 20   CREATININE 0.65 0.75 0.68  GLUCOSE 219* 117* 98   Electrolytes  Recent Labs Lab 07/29/14 0738 07/30/14 0235 07/30/14 0804 07/30/14 2030 07/31/14 0213 07/31/14 0742  CALCIUM 9.6 9.8  --   --  9.4  --   MG  --   --  2.3 2.3  --  2.2  PHOS  --   --  5.0* 3.7  --  3.1   Sepsis Markers  Recent Labs Lab 07/25/14 1844 07/25/14 2032 07/26/14 1007 07/26/14 1700  LATICACIDVEN 3.4* 5.5* 1.4 1.5  PROCALCITON <0.10  --   --   --    ABG  Recent Labs Lab 07/26/14 0637 07/26/14 1120 07/28/14 0420  PHART 7.365 7.378 7.413  PCO2ART 26.0* 36.4 36.0  PO2ART 152.0* 173.0* 103.0*   Liver Enzymes  Recent Labs Lab 07/25/14 1844 07/26/14 0257  AST 11 15  ALT 10 11  ALKPHOS 58 55  BILITOT 0.2* <0.2*  ALBUMIN 3.4* 3.2*   Cardiac Enzymes  Recent Labs Lab 07/25/14 1718 07/25/14 1844  07/29/14 0042 07/29/14 0738 07/29/14 1230  TROPONINI  --  <0.30  < > <0.30 <0.30 <0.30  PROBNP <5.0 <5.0  --   --   --   --   < > = values in this interval not displayed. Glucose  Recent Labs Lab 07/30/14 1555 07/30/14 1911 07/30/14 2337 07/31/14 0349 07/31/14 0808 07/31/14 1211  GLUCAP 95 86 109* 103* 84 99    Imaging Ct Soft Tissue Neck Wo Contrast  07/30/2014   CLINICAL DATA:  Preoperative anatomic evaluation prior to planned tracheostomy tube placement.  EXAM: CT NECK WITHOUT CONTRAST  TECHNIQUE: Multidetector CT imaging of the neck was performed following the standard protocol without intravenous contrast.  COMPARISON:  Cervical CT, 04/07/2013  FINDINGS: Endotracheal tube extends to the thoracic trachea, 2 cm above the carina. Nasogastric tube passes below the level of the carina within the esophagus.   Airway is widely patent. There are no mucosal masses or areas of significant mucosal asymmetry.  Thyroid gland is normal in size and attenuation. No thyroid gland nodules are evident on this unenhanced study.  No neck masses or pathologically enlarged lymph nodes. No significant asymmetry. Major salivary glands are unremarkable.  Normal positions for the carotid arteries and internal jugular veins.  Structures of the skullbase are within normal limits. Visualized portions of the globes orbits are normal.  Visualized sinuses and mastoid air cells are clear. Visualized upper lungs are clear. No significant bony abnormality.  IMPRESSION: Normal unenhanced CT scan of the neck. There is no abnormality of the trachea. Normal size and appearance of the thyroid gland.   Electronically Signed   By: Amie Portlandavid  Ormond M.D.   On: 07/30/2014 15:29   Dg Chest Port 1 View  07/29/2014   CLINICAL DATA:  Acute respiratory failure, check nasogastric catheter  EXAM: PORTABLE CHEST - 1 VIEW  COMPARISON:  07/28/2014  FINDINGS: Cardiac shadow is within normal limits. A nasogastric catheter is noted coiled within the stomach. Endotracheal tube is seen approximately the 5 cm above the carina. The lungs are well aerated bilaterally. Very minimal right basilar atelectasis is noted.  IMPRESSION: Tubes and lines as described above.  Mild right basilar atelectasis.   Electronically Signed   By: Alcide CleverMark  Lukens M.D.   On: 07/29/2014 16:03     ASSESSMENT / PLAN:  PULMONARY ETT 12/11>>>12/12 Acute respiratory faiulre  THIS IS SEVERE VCD Significant atx post extubation.  Hx asthma   P:   Will ct neck - neg Will plan trach this afternoon, pt consented in full Wean cpap5 ps 5, goal 2 hr, no extubation planned as fo rtrach, can escalate PS if needed Post trach will go straight to trach collar, plan cuff down and pmv fast  CARDIOVASCULAR A: QTC prolongation - resolved Coc abuse P:   Tele Allow pos balance to even balance With prior  QTC keep k greater 4, mag greater 2  RENAL A: Hypokalemia relative   P:   F/u chem  kvo k supp to above goals  GASTROINTESTINAL A: No acute issues  P:   PPI  T held Assess last BM with narcs, add softener  HEMATOLOGIC A: Leukocytosis - Favor stress and steroid induced.  P:   F/u cbc  Lovenox OK coags today ok fo rtrach  INFECTIOUS A: No evidence infection  P:   BCx2 12/11>>> Urine 12/11>>> Neg Abx: Vanc, start date 12/14>>>1215 Abx: Zosyn, start  date 12/14>>>12/16  No  Infection noted, dc all abx  ENDOCRINE A: Hyperglycemic  P:   SSI q4.  NEUROLOGIC A: Metabolic encephelopathy- resolved Possible opiate withdrawal Possible toxic ingestion coc abuse  P:   RASS goal= 0 WUA on propofol dilauded, wil lplan and post trach Goal off prop after trach Worsening anxiety, add ativan  CCM 30 min  Mcarthur Rossetti. Tyson Alias, MD, FACP Pgr: [phone] [place]

## 2014-07-31 NOTE — Procedures (Signed)
Bedside Tracheostomy Insertion Procedure Note   Patient Details:   Name: Adele Schildermanda Chauca DOB: 02/09/1976 MRN: [id number]  Procedure: Tracheostomy  Pre Procedure Assessment: ET Tube Size:7.0 ET Tube secured at lip (cm):24  Bite block in place: No Breath Sounds: Rhonch  Post Procedure Assessment: BP 93/76 mmHg  Pulse 76  Temp(Src) 98.8 F (37.1 C) (Oral)  Resp 26  Ht 5\' 3"  (1.6 m)  Wt 185 lb 10 oz (84.2 kg)  BMI 32.89 kg/m2  SpO2 100%  LMP 07/15/2014 O2 sats: stable throughout Complications: No apparent complications Patient did tolerate procedure well Tracheostomy Brand:Shiley Tracheostomy Style:Cuffed Tracheostomy Size: 6 Tracheostomy Secured NWG:NFAOZHYvia:Sutures Tracheostomy Placement Confirmation:Trach cuff visualized and in place and Chest X ray ordered for placement    Cherylin MylarDoyle, Samael Blades 07/31/2014, 2:46 PM

## 2014-08-01 DIAGNOSIS — J383 Other diseases of vocal cords: Secondary | ICD-10-CM

## 2014-08-01 LAB — GLUCOSE, CAPILLARY
GLUCOSE-CAPILLARY: 109 mg/dL — AB (ref 70–99)
GLUCOSE-CAPILLARY: 95 mg/dL (ref 70–99)
Glucose-Capillary: 100 mg/dL — ABNORMAL HIGH (ref 70–99)
Glucose-Capillary: 102 mg/dL — ABNORMAL HIGH (ref 70–99)
Glucose-Capillary: 146 mg/dL — ABNORMAL HIGH (ref 70–99)
Glucose-Capillary: 94 mg/dL (ref 70–99)

## 2014-08-01 LAB — CBC WITH DIFFERENTIAL/PLATELET
BASOS ABS: 0 10*3/uL (ref 0.0–0.1)
Basophils Relative: 0 % (ref 0–1)
EOS PCT: 0 % (ref 0–5)
Eosinophils Absolute: 0 10*3/uL (ref 0.0–0.7)
HCT: 33 % — ABNORMAL LOW (ref 36.0–46.0)
Hemoglobin: 10.3 g/dL — ABNORMAL LOW (ref 12.0–15.0)
LYMPHS ABS: 2.6 10*3/uL (ref 0.7–4.0)
Lymphocytes Relative: 27 % (ref 12–46)
MCH: 24.9 pg — AB (ref 26.0–34.0)
MCHC: 31.2 g/dL (ref 30.0–36.0)
MCV: 79.7 fL (ref 78.0–100.0)
Monocytes Absolute: 0.8 10*3/uL (ref 0.1–1.0)
Monocytes Relative: 9 % (ref 3–12)
Neutro Abs: 6.2 10*3/uL (ref 1.7–7.7)
Neutrophils Relative %: 64 % (ref 43–77)
PLATELETS: 323 10*3/uL (ref 150–400)
RBC: 4.14 MIL/uL (ref 3.87–5.11)
RDW: 14.4 % (ref 11.5–15.5)
WBC: 9.7 10*3/uL (ref 4.0–10.5)

## 2014-08-01 LAB — BASIC METABOLIC PANEL
Anion gap: 10 (ref 5–15)
BUN: 15 mg/dL (ref 6–23)
CALCIUM: 9.4 mg/dL (ref 8.4–10.5)
CO2: 29 mEq/L (ref 19–32)
CREATININE: 0.65 mg/dL (ref 0.50–1.10)
Chloride: 101 mEq/L (ref 96–112)
Glucose, Bld: 100 mg/dL — ABNORMAL HIGH (ref 70–99)
Potassium: 3.3 mEq/L — ABNORMAL LOW (ref 3.7–5.3)
Sodium: 140 mEq/L (ref 137–147)

## 2014-08-01 LAB — CULTURE, BLOOD (ROUTINE X 2)
CULTURE: NO GROWTH
Culture: NO GROWTH

## 2014-08-01 LAB — MAGNESIUM: MAGNESIUM: 2.1 mg/dL (ref 1.5–2.5)

## 2014-08-01 LAB — PHOSPHORUS: Phosphorus: 4.1 mg/dL (ref 2.3–4.6)

## 2014-08-01 MED ORDER — HYDROMORPHONE HCL 1 MG/ML IJ SOLN
0.5000 mg | INTRAMUSCULAR | Status: DC | PRN
  Administered 2014-08-01 – 2014-08-02 (×7): 1 mg via INTRAVENOUS
  Administered 2014-08-03: 0.5 mg via INTRAVENOUS
  Administered 2014-08-03 – 2014-08-06 (×4): 1 mg via INTRAVENOUS
  Filled 2014-08-01 (×13): qty 1

## 2014-08-01 MED ORDER — POTASSIUM CHLORIDE 20 MEQ/15ML (10%) PO SOLN
40.0000 meq | Freq: Three times a day (TID) | ORAL | Status: DC
  Administered 2014-08-01: 40 meq
  Filled 2014-08-01: qty 30

## 2014-08-01 MED ORDER — CLONAZEPAM 0.1 MG/ML ORAL SUSPENSION: 1.0000 mg | Freq: Two times a day (BID) | ORAL | Status: DC

## 2014-08-01 MED ORDER — MORPHINE SULFATE 15 MG PO TABS
30.0000 mg | ORAL_TABLET | ORAL | Status: DC | PRN
  Administered 2014-08-01 – 2014-08-06 (×20): 30 mg via ORAL
  Filled 2014-08-01 (×20): qty 2

## 2014-08-01 MED ORDER — ALPRAZOLAM 0.5 MG PO TABS: 1.0000 mg | ORAL_TABLET | Freq: Three times a day (TID) | ORAL | Status: DC | PRN

## 2014-08-01 MED ORDER — ALPRAZOLAM 0.5 MG PO TABS
1.0000 mg | ORAL_TABLET | Freq: Three times a day (TID) | ORAL | Status: DC | PRN
  Administered 2014-08-01 – 2014-08-02 (×2): 1 mg
  Filled 2014-08-01 (×2): qty 2

## 2014-08-01 MED ORDER — CLONAZEPAM 0.5 MG PO TABS
1.0000 mg | ORAL_TABLET | Freq: Two times a day (BID) | ORAL | Status: DC
  Administered 2014-08-01 – 2014-08-02 (×3): 1 mg
  Filled 2014-08-01 (×3): qty 2

## 2014-08-01 MED ORDER — POTASSIUM CHLORIDE 20 MEQ/15ML (10%) PO SOLN
40.0000 meq | Freq: Three times a day (TID) | ORAL | Status: AC
  Administered 2014-08-01 – 2014-08-02 (×3): 40 meq
  Filled 2014-08-01 (×5): qty 30

## 2014-08-01 MED ORDER — ZOLPIDEM TARTRATE 5 MG PO TABS
5.0000 mg | ORAL_TABLET | Freq: Every day | ORAL | Status: DC
  Administered 2014-08-01 – 2014-08-05 (×5): 5 mg via ORAL
  Filled 2014-08-01 (×5): qty 1

## 2014-08-01 NOTE — Progress Notes (Signed)
   07/31/14 2052  Vent Select  Invasive or Noninvasive Invasive  Adult Vent Y  Adult Ventilator Settings  Vent Type Servo i  Humidity HME  Vent Mode PSV  FiO2 (%) 40 %  Pressure Support 10 cmH20  PEEP 5 cmH20  Adult Ventilator Measurements  Resp Rate Spontaneous 22 br/min  Exhaled Vt 358 mL  Measured Ve 7.5 mL  Patient placed on PSV to wean per MD order. RT will attempt trach collar trial in the AM.

## 2014-08-01 NOTE — Progress Notes (Signed)
PULMONARY / CRITICAL CARE MEDICINE    Name: Julie Kerr MRN: [id number] DOB: 03/18/1976    ADMISSION DATE:  07/25/2014  PRIMARY SERVICE: PCCM  CHIEF COMPLAINT:  Dyspnea  BRIEF PATIENT DESCRIPTION: 38 y/o woman w/ hx of asthma who presents with respiratory distress due to significant gap metabolic acidosis. She was intubated, to ICU. Was extubated 12/2 and sent to floor. 12/14 she developed acute onset respiratory distress, transferred to ICU and placed on BiPAP.   SIGNIFICANT EVENTS / STUDIES:  12/11 Intubated in ED 12/12- awake, weaning, extubated 12/13 - to floor, TRH 12/14 - respiratory distress, back to ICU,BiPAP - fully resolved 12/15- re intubated 12/16- CT neck noncontrast>>neg 12/17 Trach tube placement 12/18 Comfortable on ATC. SLP eval ordered. Transfer to SDU ordered  INTERVAL:   Tolerating ATC. RASS 0. No new complaints. Wants to eat. Presently with   VITAL SIGNS: Temp:  [98.1 F (36.7 C)-99.1 F (37.3 C)] 98.1 F (36.7 C) (12/18 1248) Pulse Rate:  [62-121] 82 (12/18 1154) Resp:  [15-26] 21 (12/18 1154) BP: (89-160)/(43-116) 101/63 mmHg (12/18 0900) SpO2:  [95 %-100 %] 95 % (12/18 1154) FiO2 (%):  [40 %-100 %] 40 % (12/18 1154) Weight:  [81.8 kg (180 lb 5.4 oz)] 81.8 kg (180 lb 5.4 oz) (12/18 0500) HEMODYNAMICS:   VENTILATOR SETTINGS: Vent Mode:  [-] PRVC FiO2 (%):  [40 %-100 %] 40 % Set Rate:  [16 bmp] 16 bmp Vt Set:  [420 mL] 420 mL PEEP:  [5 cmH20] 5 cmH20 Pressure Support:  [10 cmH20] 10 cmH20 Plateau Pressure:  [12 cmH20-19 cmH20] 13 cmH20 INTAKE / OUTPUT: Intake/Output      12/17 0701 - 12/18 0700 12/18 0701 - 12/19 0700   I.V. (mL/kg) 292.1 (3.6)    NG/GT 1236.4 395   Total Intake(mL/kg) 1528.5 (18.7) 395 (4.8)   Urine (mL/kg/hr) 455 (0.2) 250 (0.5)   Total Output 455 250   Net +1073.5 +145          PHYSICAL EXAMINATION: General:  NAD Neuro:  nonfocal HEENT: trach site clean Cardiovascular:  Reg, no M Lungs:  Clear Abdomen:  Obese,  +bs, no r/g Ext: no edema  LABS: I have reviewed all of today's lab results. Relevant abnormalities are discussed in the A/P section   ASSESSMENT: Acute respiratory failure   Severe VCD Atelectasis, resolved Hx of asthma  QTC prolongation, resolved Cocaine abuse Hypokalemia, rescurrent Leukocytosis, resolved No evidence infection Hyperglycemia, resolved Acute encephelopathy- resolved Chronic pain syndrome Chronic opiate use Polysubstance abuse  Transfer to vent SDU bed SLP eval for PMV and swallow Cont TFs for now If passes swallow eval, DC NGT and transition per tube meds to PO Resume home opiate regimen Will need DC planning to address trach tube care Long term mgmt plan for VCD needs to be defined  Billy Fischeravid Simonds, MD ; [facility] service Mobile [phone].  After 5:30 PM or weekends, call [phone]

## 2014-08-02 LAB — GLUCOSE, CAPILLARY
Glucose-Capillary: 100 mg/dL — ABNORMAL HIGH (ref 70–99)
Glucose-Capillary: 95 mg/dL (ref 70–99)
Glucose-Capillary: 95 mg/dL (ref 70–99)
Glucose-Capillary: 97 mg/dL (ref 70–99)

## 2014-08-02 MED ORDER — ALPRAZOLAM 0.5 MG PO TABS
1.0000 mg | ORAL_TABLET | Freq: Three times a day (TID) | ORAL | Status: DC | PRN
  Administered 2014-08-02 – 2014-08-06 (×7): 1 mg via ORAL
  Filled 2014-08-02 (×7): qty 2

## 2014-08-02 MED ORDER — CETYLPYRIDINIUM CHLORIDE 0.05 % MT LIQD: 7.0000 mL | Freq: Two times a day (BID) | OROMUCOSAL | Status: DC

## 2014-08-02 MED ORDER — CLONAZEPAM 1 MG PO TABS
1.0000 mg | ORAL_TABLET | Freq: Two times a day (BID) | ORAL | Status: DC
  Administered 2014-08-02 – 2014-08-04 (×4): 1 mg via ORAL
  Filled 2014-08-02 (×6): qty 1

## 2014-08-02 MED ORDER — DOCUSATE SODIUM 50 MG/5ML PO LIQD
100.0000 mg | Freq: Two times a day (BID) | ORAL | Status: DC
  Administered 2014-08-02 – 2014-08-06 (×7): 100 mg via ORAL
  Filled 2014-08-02 (×9): qty 10

## 2014-08-02 MED ORDER — CHLORHEXIDINE GLUCONATE 0.12 % MT SOLN
15.0000 mL | Freq: Two times a day (BID) | OROMUCOSAL | Status: DC
  Administered 2014-08-02: 15 mL via OROMUCOSAL
  Filled 2014-08-02: qty 15

## 2014-08-02 MED ORDER — DOCUSATE SODIUM 100 MG PO CAPS
100.0000 mg | ORAL_CAPSULE | Freq: Two times a day (BID) | ORAL | Status: DC
  Filled 2014-08-02: qty 1

## 2014-08-02 NOTE — Evaluation (Signed)
Passy-Muir Speaking Valve - Evaluation Patient Details  Name: Julie Kerr MRN: [id number] Date of Birth: 04/08/1976  Today's Date: 08/02/2014 Time: 0807-0850 SLP Time Calculation (min) (ACUTE ONLY): 43 min  Past Medical History:  Past Medical History  Diagnosis Date  . Asthma   . Anxiety   . COPD (chronic obstructive pulmonary disease)   . Bronchitis   . Heart murmur   . Shortness of breath    Past Surgical History:  Past Surgical History  Procedure Laterality Date  . Spinal fusion     HPI:  38 y/o woman w/ hx of COPD, anxiety, heart murmur, bronchitis, polysubstance abuse, spinal fusion in lower back, asthma, ? VCD,  who presents with respiratory distress due to significant gap metabolic acidosis. She was intubated, to ICU. Was extubated 12/2 and sent to floor. 12/14 she developed acute onset respiratory distress, transferred to ICU and placed on BiPAP. Re-intubated 12/15/ S/p tracheostomy 12/17.    Assessment / Plan / Recommendation Clinical Impression  PMSV evaluation complete. Patient presents with decreased ability to move air through upper airway as evidenced by initial aphonia, expriatory wheeze, and little-no airflow via mouth/nose with PMSV in place. With max SLP demonstration cueing for relaxation and throat clearing, adduction of vocal folds acheived. Patient able to produce audible but strained phonation at the short phrase level. Drop in O2 below 90 and evidence of air trapping noted within 1 minute of valve trial. Valve removed and education complete. Upper airway patency likely reduced at this time due to upper airway edema s/p surgery and/or size of trach tube. SLP will continue to f/u for diagnostic treatment. Recommend PMSV trials during SLP treatment only with cuff left deflated as tolerated to facilitate use of upper airway. Aspiration risk is increased given second intubation and trach placement however given age, intact cognition, management of secretions, and  spontaneous swallow at baseline, prognosis for ability to protect airway during swallow good despite inability to tolerate PMSV. Will proceed with instrumental assessment of swallow without valve in place.     SLP Assessment  Patient needs continued Speech Lanaguage Pathology Services    Follow Up Recommendations   (TBD)    Frequency and Duration min 3x week  2 weeks   Pertinent Vitals/Pain n/a    SLP Goals Potential to Achieve Goals (ACUTE ONLY): Good   PMSV Trial  PMSV was placed for: 1-2 minutes Able to redirect subglottic air through upper airway: Yes Able to Attain Phonation: Yes Voice Quality: Other (comment) (strained) Able to Expectorate Secretions: Yes Level of Secretion Expectoration with PMSV: Oral Breath Support for Phonation: Moderately decreased Intelligibility: Intelligible Respirations During Trial: 24 SpO2 During Trial: (!) 88 % Pulse During Trial: 120 Behavior: Alert;Cooperative;Expresses self well   Tracheostomy Tube  Additional Tracheostomy Tube Assessment Fenestrated: No Trach Collar Period: 24 hours Secretion Description: minimal Frequency of Tracheal Suctioning: n/a, patient managing indepedently Level of Secretion Expectoration: Oral    Vent Dependency  Vent Dependent: No FiO2 (%): 28 %    Cuff Deflation Trial Tolerated Cuff Deflation: Yes Length of Time for Cuff Deflation Trial: 5 minutes prior to placement of valve Behavior: Alert;Controlled;Cooperative;Expresses self well   Ferdinand LangoLeah Clela Hagadorn MA, [name] [phone]   Dunya Meiners Meryl 08/02/2014, 9:07 AM

## 2014-08-02 NOTE — Procedures (Signed)
Objective Swallowing Evaluation: Fiberoptic Endoscopic Evaluation of Swallowing  Patient Details  Name: Julie Kerr MRN: [id number] Date of Birth: 07/14/1976  Today's Date: 08/02/2014 Time: 1130-1148 SLP Time Calculation (min) (ACUTE ONLY): 18 min  Past Medical History:  Past Medical History  Diagnosis Date  . Asthma   . Anxiety   . COPD (chronic obstructive pulmonary disease)   . Bronchitis   . Heart murmur   . Shortness of breath    Past Surgical History:  Past Surgical History  Procedure Laterality Date  . Spinal fusion     HPI:  38 y/o woman w/ hx of COPD, anxiety, heart murmur, bronchitis, polysubstance abuse, spinal fusion in lower back, asthma, ? VCD,  who presents with respiratory distress due to significant gap metabolic acidosis. She was intubated, to ICU. Was extubated 12/2 and sent to floor. 12/14 she developed acute onset respiratory distress, transferred to ICU and placed on BiPAP. Re-intubated 12/15/ S/p tracheostomy 12/17.      Assessment / Plan / Recommendation Clinical Impression  Dysphagia Diagnosis: Mild pharyngeal phase dysphagia Clinical impression: Patient presents with a mild pharyngeal phase dysphagia characterized by delayed swallow initiation, likely sensory deficits due to multiple intubations and decreased airflow through upper airway, however with full ability to protect airway. No penetration or aspiration observed. Pharyngeal strength intact. Reviewed general safe swallowing precautions with patient.     Treatment Recommendation  Therapy as outlined in treatment plan below    Diet Recommendation Regular;Thin liquid   Liquid Administration via: Cup;Straw Medication Administration: Whole meds with liquid Supervision: Patient able to self feed;Intermittent supervision to cue for compensatory strategies Compensations: Slow rate;Small sips/bites Postural Changes and/or Swallow Maneuvers: Seated upright 90 degrees;Upright 30-60 min after meal     Other  Recommendations Oral Care Recommendations: Oral care BID   Follow Up Recommendations  None    Frequency and Duration min 3x week              General HPI: 38 y/o woman w/ hx of COPD, anxiety, heart murmur, bronchitis, polysubstance abuse, spinal fusion in lower back, asthma, ? VCD,  who presents with respiratory distress due to significant gap metabolic acidosis. She was intubated, to ICU. Was extubated 12/2 and sent to floor. 12/14 she developed acute onset respiratory distress, transferred to ICU and placed on BiPAP. Re-intubated 12/15/ S/p tracheostomy 12/17.  Type of Study: Fiberoptic Endoscopic Evaluation of Swallowing Reason for Referral: Objectively evaluate swallowing function Diet Prior to this Study: NPO;Panda Temperature Spikes Noted: No Respiratory Status: Trach collar History of Recent Intubation: Yes Length of Intubations (days): 4 days (2 2 day intubations) Date extubated: 07/29/14 Behavior/Cognition: Alert;Cooperative;Pleasant mood Oral Cavity - Dentition: Adequate natural dentition Oral Motor / Sensory Function: Within functional limits Self-Feeding Abilities: Able to feed self Patient Positioning: Upright in chair Baseline Vocal Quality:  (non-verbal, trach without pmsv in place) Volitional Cough: Weak Volitional Swallow: Able to elicit Anatomy: Within functional limits Pharyngeal Secretions: Not observed secondary MBS    Reason for Referral Objectively evaluate swallowing function   Oral Phase Oral Preparation/Oral Phase Oral Phase: WFL   Pharyngeal Phase Pharyngeal Phase Pharyngeal Phase: Impaired Pharyngeal - Thin Pharyngeal - Thin Cup: Delayed swallow initiation;Premature spillage to pyriform sinuses Pharyngeal - Thin Straw: Delayed swallow initiation;Premature spillage to pyriform sinuses Pharyngeal - Solids Pharyngeal - Puree: Delayed swallow initiation;Premature spillage to valleculae Pharyngeal - Mechanical Soft: Delayed swallow  initiation;Premature spillage to valleculae  Cervical Esophageal Phase    GO Julie Kerr [phone]  Cervical Esophageal Phase [facility]ophageal [facility]: [facility]         Julie Kerr 08/02/2014, 12:19 PM

## 2014-08-02 NOTE — Progress Notes (Signed)
Speech Language Pathology Treatment: Hillary BowPassy Muir Speaking valve  Patient Details Name: Julie Kerr Toelle MRN: [id number] DOB: 07/02/1976 Today's Date: 08/02/2014 Time: [id number] SLP Time Calculation (min) (ACUTE ONLY): 8 min  Assessment / Plan / Recommendation Clinical Impression  F/u for second speech treatment as patient noted to have low volume leak speech during FEES exam.  Passy muir valve placed following testing of swallowing under visual endoscopy. No upper airway edema noted. Patient able to demonstrate intact adduction of vocal cords bilaterally when cues for sustained phonation. Patient then able to wear PMSV for approximately 5 minutes with ability to direct air through upper airway and achieve audible and mildly strained phonation at the short phrase level. Patient verbalized "tightness" in chest following 5 minutes with a mild drop in O2 saturation levels. Suspect that size of trach and/or presence of cuff (although deflated) is primary contributing factor to decreased ability to entirely exhale CO2 through upper airway. Discussed with MD who is aware and plans for downsize as soon as appropriate. Patient just received trach 12/17.   HPI HPI: 38 y/o woman w/ hx of COPD, anxiety, heart murmur, bronchitis, polysubstance abuse, spinal fusion in lower back, asthma, ? VCD,  who presents with respiratory distress due to significant gap metabolic acidosis. She was intubated, to ICU. Was extubated 12/2 and sent to floor. 12/14 she developed acute onset respiratory distress, transferred to ICU and placed on BiPAP. Re-intubated 12/15/ S/p tracheostomy 12/17.    Pertinent Vitals Pain Assessment: No/denies pain  SLP Plan  Continue with current plan of care    Recommendations Medication Administration: Whole meds with liquid Supervision: Patient able to self feed;Intermittent supervision to cue for compensatory strategies Compensations: Slow rate;Small sips/bites Postural Changes and/or Swallow  Maneuvers: Seated upright 90 degrees;Upright 30-60 min after meal      Patient may use Passy-Muir Speech Valve: with SLP only       Oral Care Recommendations: Oral care BID Follow up Recommendations: None Plan: Continue with current plan of care    GO    Ferdinand LangoLeah Janalee Grobe MA, [name] [phone]  Izaiha Lo Meryl 08/02/2014, 1:09 PM

## 2014-08-02 NOTE — Progress Notes (Signed)
Pt set up on aerosol trach collar 28%, ambu bag and trach supplies at bedside.

## 2014-08-02 NOTE — Progress Notes (Signed)
PULMONARY / CRITICAL CARE MEDICINE    Name: Adele Schildermanda Fails MRN: [id number] DOB: 09/11/1975    ADMISSION DATE:  07/25/2014  PRIMARY SERVICE: PCCM  CHIEF COMPLAINT:  Dyspnea  BRIEF PATIENT DESCRIPTION: 38 y/o woman w/ hx of asthma who presents with respiratory distress due to significant gap metabolic acidosis. She was intubated, to ICU. Was extubated 12/2 and sent to floor. 12/14 she developed acute onset respiratory distress, transferred to ICU and placed on BiPAP.   SIGNIFICANT EVENTS / STUDIES:  12/11 Intubated in ED 12/12- awake, weaning, extubated 12/13 - to floor, TRH 12/14 - respiratory distress, back to ICU,BiPAP - fully resolved 12/15- re intubated 12/16- CT neck noncontrast>>neg 12/17 Trach tube placement 12/18 Comfortable on ATC. SLP eval ordered. Transfer to SDU ordered   INTERVAL:   Tolerating ATC. RASS 0. No new complaints. Wants to eat.  VITAL SIGNS: Temp:  [98.1 F (36.7 C)-100.4 F (38 C)] 100.4 F (38 C) (12/19 0739) Pulse Rate:  [82-113] 96 (12/19 0900) Resp:  [12-25] 17 (12/19 0900) BP: (90-102)/(53-83) 100/57 mmHg (12/19 0900) SpO2:  [95 %-100 %] 95 % (12/19 0900) FiO2 (%):  [28 %-40 %] 28 % (12/19 0816) HEMODYNAMICS:   VENTILATOR SETTINGS: Vent Mode:  [-]  FiO2 (%):  [28 %-40 %] 28 % INTAKE / OUTPUT: Intake/Output      12/18 0701 - 12/19 0700 12/19 0701 - 12/20 0700   I.V. (mL/kg) 120 (1.5)    NG/GT 1385    Total Intake(mL/kg) 1505 (18.4)    Urine (mL/kg/hr) 2270 (1.2) 225 (1)   Total Output 2270 225   Net -765 -225          PHYSICAL EXAMINATION: General:  NAD Neuro:  nonfocal HEENT: trach site clean Cardiovascular:  Reg, no M Lungs:  Clear Abdomen:  Obese, +bs, no r/g Ext: no edema  LABS:  Recent Labs Lab 07/30/14 0235 07/31/14 0213 08/01/14 0233  NA 139 140 140  K 4.2 3.5* 3.3*  CL 101 103 101  CO2 20 24 29   BUN 13 20 15   CREATININE 0.75 0.68 0.65  GLUCOSE 117* 98 100*    Recent Labs Lab 07/27/14 0235 07/31/14 0213  08/01/14 0233  HGB 9.3* 9.6* 10.3*  HCT 29.0* 31.1* 33.0*  WBC 14.5* 13.2* 9.7  PLT 255 308 323      ASSESSMENT: Acute respiratory failure   Severe VCD Atelectasis, resolved Hx of asthma  QTC prolongation, resolved Cocaine abuse Hypokalemia, rescurrent Leukocytosis, resolved No evidence infection Hyperglycemia, resolved Acute encephelopathy- resolved Chronic pain syndrome Chronic opiate use Polysubstance abuse  Discussion No distress. Remains off vent   Plan Transfer to vent SDU bed SLP eval for PMV and swallowing Cont TFs for now, if passes swallow eval, DC NGT and transition per tube meds to PO Resumed home opiate regimen Post op day 2 trach. Change to cuffless at day 10. Will need to decide on size 6 vs 4  Will need DC planning to address trach tube care Long term mgmt plan for VCD needs to be defined  Simonne MartinetPeter E Babcock ACNP-BC [facility] Pulmonary/Critical Care Pager # [phone] OR # [phone] if no answer   Attending Note:  I have examined patient, reviewed labs, studies and notes. I have discussed the case with Kreg ShropshireP Babcock and I agree with the data and plans as amended above.   Levy Pupaobert Ahlaya Ende, MD, PhD 08/02/2014, 2:13 PM [facility] [phone] or if no answer [phone]

## 2014-08-02 NOTE — Progress Notes (Addendum)
Pt arrived from ICU per w/c . Oriented to room suctioned tracheal and pt herself uses [place] orally. Alert/oriented  Set up to feed herself Reg diet

## 2014-08-03 NOTE — Progress Notes (Addendum)
Called in to room by Nurse tech to assist with evaluation of fall. Found pt sitting in upright position by side of commode. [facility] " [facility]". Pt states that she hit her L cheek on the bedside commode and that her tooth second on the right was loose. Call placed to PCCM. Will continue to monitor. Pt is a 2 person assist and will also use her walker for more support.

## 2014-08-03 NOTE — Progress Notes (Signed)
PULMONARY / CRITICAL CARE MEDICINE    Name: Julie Kerr MRN: [id number] DOB: 11/20/1975    ADMISSION DATE:  07/25/2014  PRIMARY SERVICE: PCCM  CHIEF COMPLAINT:  Dyspnea  BRIEF PATIENT DESCRIPTION: 38 y/o woman w/ hx of asthma who presents with respiratory distress due to significant gap metabolic acidosis. She was intubated, to ICU. Was extubated 12/2 and sent to floor. 12/14 she developed acute onset respiratory distress, transferred to ICU and placed on BiPAP.   SIGNIFICANT EVENTS / STUDIES:  12/11 Intubated in ED 12/12- awake, weaning, extubated 12/13 - to floor, TRH 12/14 - respiratory distress, back to ICU,BiPAP - fully resolved 12/15- re intubated 12/16- CT neck noncontrast>>neg 12/17 Trach tube placement 12/18 Comfortable on ATC. SLP eval ordered. Transfer to SDU ordered   INTERVAL:   Tolerating ATC. RASS 0.eating ok.  C/o back pain , not tol PMV with #6 trach  VITAL SIGNS: Temp:  [98.3 F (36.8 C)-99.8 F (37.7 C)] 98.3 F (36.8 C) (12/20 0722) Pulse Rate:  [89-107] 91 (12/20 0722) Resp:  [14-25] 19 (12/20 0722) BP: (94-122)/(57-83) 102/75 mmHg (12/20 0722) SpO2:  [93 %-100 %] 100 % (12/20 0722) FiO2 (%):  [28 %] 28 % (12/20 0600) Weight:  [80 kg (176 lb 5.9 oz)-82 kg (180 lb 12.4 oz)] 80 kg (176 lb 5.9 oz) (12/19 1748)    VENTILATOR SETTINGS: Vent Mode:  [-]  FiO2 (%):  [28 %] 28 % INTAKE / OUTPUT: Intake/Output      12/19 0701 - 12/20 0700 12/20 0701 - 12/21 0700   P.O. 590    I.V. (mL/kg) 70 (0.9)    NG/GT 375    Total Intake(mL/kg) 1035 (12.9)    Urine (mL/kg/hr) 925 (0.5)    Total Output 925     Net +110            PHYSICAL EXAMINATION: General:  NAD Neuro:  nonfocal HEENT: trach site clean Cardiovascular:  Reg, no M Lungs:  Clear Abdomen:  Obese, +bs, no r/g Ext: no edema  LABS:  Recent Labs Lab 07/30/14 0235 07/31/14 0213 08/01/14 0233  NA 139 140 140  K 4.2 3.5* 3.3*  CL 101 103 101  CO2 20 24 29   BUN 13 20 15   CREATININE  0.75 0.68 0.65  GLUCOSE 117* 98 100*    Recent Labs Lab 07/31/14 0213 08/01/14 0233  HGB 9.6* 10.3*  HCT 31.1* 33.0*  WBC 13.2* 9.7  PLT 308 323      ASSESSMENT: Acute respiratory failure   Severe VCD Atelectasis, resolved Hx of asthma  QTC prolongation, resolved Cocaine abuse Hypokalemia, rescurrent Leukocytosis, resolved No evidence infection Hyperglycemia, resolved Acute encephelopathy- resolved Chronic pain syndrome Chronic opiate use Polysubstance abuse  Discussion No distress. Remains off vent   Plan SLP eval for PMV and swallowing PO diet Cont home opiate regimen Post op day 2 trach.  Need to Downsize to #4.0 trach Will need DC planning to address trach tube care Long term mgmt plan for VCD needs to be defined   Caryl Bisatrick WrightMD Beeper  [phone]  Cell  [phone]  If no response or cell goes to voicemail, call beeper [phone]  08/03/2014, 7:57 AM
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
## 2014-08-03 NOTE — Progress Notes (Signed)
Speech Language Pathology Treatment: Dysphagia;Passy Muir Speaking valve  Patient Details Name: Julie Kerr MRN: [id number] DOB: 1975/11/15 Today's Date: 08/03/2014 Time: 8527-7824 SLP Time Calculation (min) (ACUTE ONLY): 16 min  Assessment / Plan / Recommendation Clinical Impression  Patient has met dysphagia goals by demonstrating ability to consume regular textures, thin liquids today during treatment with no overt evidence of aspiration and independent use of aspiration precautions. PMV not in place for pos however placed by SLP following dysphagia treatment. Again, patient able to achieve strained, but audible phonation following SLP cues for throat clear to clear suspected laryngeal/pharyngeal secretions, however with almost immediate evidence of air trapping, wheezing exhalations, and increased RR. Valve removed with loud burst of air from trach hub. Educated patient regarding continued use of valve with SLP only. SLP suspects that patient will not tolerate PMSV well until trach changed to cuffless or downsized. MD aware. Will continue to f/u.    HPI HPI: 38 y/o woman w/ hx of COPD, anxiety, heart murmur, bronchitis, polysubstance abuse, spinal fusion in lower back, asthma, ? VCD,  who presents with respiratory distress due to significant gap metabolic acidosis. She was intubated, to ICU. Was extubated 12/2 and sent to floor. 12/14 she developed acute onset respiratory distress, transferred to ICU and placed on BiPAP. Re-intubated 12/15/ S/p tracheostomy 12/17.    Pertinent Vitals Pain Assessment: No/denies pain  SLP Plan  Other (Comment) (dysphagia goals met)    Recommendations Diet recommendations: Regular;Thin liquid Liquids provided via: Cup;Straw Medication Administration: Whole meds with liquid Supervision: Patient able to self feed Compensations: Slow rate;Small sips/bites Postural Changes and/or Swallow Maneuvers: Seated upright 90 degrees;Upright 30-60 min after meal      Patient may use Passy-Muir Speech Valve: with SLP only       Oral Care Recommendations: Oral care BID Follow up Recommendations: None Plan: Other (Comment) (dysphagia goals met)    GO    Gabriel Rainwater MA, [name] [phone]  Early Ord Meryl 08/03/2014, 1:02 PM

## 2014-08-04 DIAGNOSIS — Z930 Tracheostomy status: Secondary | ICD-10-CM

## 2014-08-04 NOTE — Progress Notes (Addendum)
PULMONARY / CRITICAL CARE MEDICINE    Name: Adele Schildermanda Mcdowell MRN: [id number] DOB: 04/26/1976    ADMISSION DATE:  07/25/2014  PRIMARY SERVICE: PCCM  CHIEF COMPLAINT:  Dyspnea  BRIEF PATIENT DESCRIPTION: 38 y/o woman w/ hx of asthma who presents with respiratory distress due to significant gap metabolic acidosis. She was intubated, to ICU. Was extubated 12/2 and sent to floor. 12/14 she developed acute onset respiratory distress, transferred to ICU and placed on BiPAP.   SIGNIFICANT EVENTS / STUDIES:  12/11 Intubated in ED 12/12- awake, weaning, extubated 12/13 - to floor, TRH 12/14 - respiratory distress, back to ICU,BiPAP - fully resolved 12/15- re intubated 12/16- CT neck noncontrast>>neg 12/17 Trach tube placement 12/18 Comfortable on ATC. SLP eval ordered. Transfer to SDU ordered   INTERVAL:   Tolerating ATC. RASS 0.eating ok.  C/o back pain , not tol PMV with #6 trach  VITAL SIGNS: Temp:  [98.4 F (36.9 C)-99.7 F (37.6 C)] 99 F (37.2 C) (12/21 1152) Pulse Rate:  [91-107] 107 (12/21 1159) Resp:  [15-26] 16 (12/21 1159) BP: (98-124)/(50-84) 105/53 mmHg (12/21 1159) SpO2:  [97 %-100 %] 97 % (12/21 1159) FiO2 (%):  [28 %] 28 % (12/21 1159) Weight:  [86 kg (189 lb 9.5 oz)] 86 kg (189 lb 9.5 oz) (12/21 1101)    VENTILATOR SETTINGS: Vent Mode:  [-]  FiO2 (%):  [28 %] 28 % INTAKE / OUTPUT: Intake/Output      12/20 0701 - 12/21 0700 12/21 0701 - 12/22 0700   P.O. 837 240   I.V. (mL/kg)     NG/GT     Total Intake(mL/kg) 837 (10.5) 240 (2.8)   Urine (mL/kg/hr) 1550 (0.8) 600 (1.4)   Total Output 1550 600   Net -713 -360          PHYSICAL EXAMINATION: General:  NAD, asleep and refusing to wake up and communicate Neuro:  Nonfocal HEENT: Trach site clean Cardiovascular:  Reg, no M Lungs:  Clear Abdomen:  Obese, +bs, no r/g Ext: no edema  LABS:  Recent Labs Lab 07/30/14 0235 07/31/14 0213 08/01/14 0233  NA 139 140 140  K 4.2 3.5* 3.3*  CL 101 103 101  CO2  20 24 29   BUN 13 20 15   CREATININE 0.75 0.68 0.65  GLUCOSE 117* 98 100*   Recent Labs Lab 07/31/14 0213 08/01/14 0233  HGB 9.6* 10.3*  HCT 31.1* 33.0*  WBC 13.2* 9.7  PLT 308 323   ASSESSMENT: Acute respiratory failure   Severe VCD Atelectasis, resolved Hx of asthma  QTC prolongation, resolved Cocaine abuse Hypokalemia, rescurrent Leukocytosis, resolved No evidence infection Hyperglycemia, resolved Acute encephelopathy- resolved Chronic pain syndrome Chronic opiate use Polysubstance abuse  Discussion No distress. Remains off vent   Plan SLP eval for PMV and swallowing all passed. PO diet Cont home opiate regimen Post op day 5 trach.  Need to Downsize to #4.0 trach but will not perform til day 7, on Wednesday, in the meantime continue size 6 cuffed tracheostomy. Will need DC planning to address trach tube care at home, CSW toa ddress. Long term mgmt plan for VCD needs to be defined as outpatient. Check labs in AM given prior hypokalemia. PT/OT evaluation ordered to assess home needs.  Alyson ReedyWesam G. Yacoub, M.D. [facility] Medicine. Pager: [phone]. After hours pager: [phone].  08/04/2014, 12:04 PM

## 2014-08-04 NOTE — Progress Notes (Addendum)
Patient c/o of left shoulder pain and states her upper left tooth is loose post fall. Goal set for day to be fall free, and to manage pain without over sedation so that patient can eat, mobilize and perform flutter valve exercises. Patient slept through am and declined offer to assist OOB. Dr. Elie ConferYaccub made aware of facial pain at 12 noon. Pt remains neurologically intact and was made aware of plan to downsize trach on Wednesday.

## 2014-08-04 NOTE — Progress Notes (Signed)
Pt stated that her tooth hurts too bad to eat her burger/fries/maccaroni/salad.  She is not due for any pain medication and I gave her a cherry ice pop.

## 2014-08-05 DIAGNOSIS — Z930 Tracheostomy status: Secondary | ICD-10-CM

## 2014-08-05 LAB — CBC
HCT: 36.1 % (ref 36.0–46.0)
HEMOGLOBIN: 11.4 g/dL — AB (ref 12.0–15.0)
MCH: 25.2 pg — ABNORMAL LOW (ref 26.0–34.0)
MCHC: 31.6 g/dL (ref 30.0–36.0)
MCV: 79.9 fL (ref 78.0–100.0)
Platelets: 446 10*3/uL — ABNORMAL HIGH (ref 150–400)
RBC: 4.52 MIL/uL (ref 3.87–5.11)
RDW: 14.2 % (ref 11.5–15.5)
WBC: 8.7 10*3/uL (ref 4.0–10.5)

## 2014-08-05 LAB — BASIC METABOLIC PANEL
Anion gap: 8 (ref 5–15)
BUN: 8 mg/dL (ref 6–23)
CHLORIDE: 100 meq/L (ref 96–112)
CO2: 30 mmol/L (ref 19–32)
CREATININE: 0.67 mg/dL (ref 0.50–1.10)
Calcium: 9.6 mg/dL (ref 8.4–10.5)
GFR calc non Af Amer: >90 mL/min (ref >90)
Glucose, Bld: 113 mg/dL — ABNORMAL HIGH (ref 70–99)
Potassium: 3.6 mmol/L (ref 3.5–5.1)
Sodium: 138 mmol/L (ref 135–145)

## 2014-08-05 LAB — MAGNESIUM: Magnesium: 2 mg/dL (ref 1.5–2.5)

## 2014-08-05 LAB — PHOSPHORUS: Phosphorus: 4.2 mg/dL (ref 2.3–4.6)

## 2014-08-05 NOTE — Progress Notes (Signed)
NUTRITION FOLLOW UP / CONSULT  Intervention:   1.  General healthful diet; encourage intake of foods and beverages as able.  RD to follow and assess for nutritional adequacy.   Nutrition Dx:   Inadequate oral intake related to inability to eat as evidenced by NPO status. Ongoing.  Monitor:   1.  Food/Beverage; pt meeting >/=90% estimated needs with tolerance. 2.  Wt/wt change; monitor trends 3.  Gastrointestinal; pt to have normal bowel function.   Assessment:   38 y/o woman w/ hx of asthma who presents with respiratory distress due to significant gap metabolic acidosis.  Patient was intubated, requiring trach placement 12/17. Patient underwent assessment by SLP who determined patient was appropriate for Regular diet with thin liquids.    Pt c/o tooth pain which is preventing her from eating and is not controlled with scheduled morphine.   Patient currently working with PT.  Discussed with nursing who reports that pain control for face pain s/p fall has been difficult, but patient is eating crackers and gingerale as desired as well as other foods when warranted by the patient.  Family has been eating patient trays which will make documentation of meal completion difficult.    Patient planning for trach exchange this afternoon. Will hold on supplements at this time as patient is reportedly eating per her usual with stable weight. Working for pain management without oversedation to facilitate ADLs.   No recent bowel movement documented.  Will monitor for normal bowel function.   RD to follow.   Height: Ht Readings from Last 1 Encounters:  08/02/14 5\' 3"  (1.6 m)    Weight Status:   Wt Readings from Last 1 Encounters:  08/05/14 188 lb 7.9 oz (85.5 kg)   07/26/14 182 lb 8.7 oz (82.8 kg)  07/25/14 176 lb 12.9 oz (80.2 kg)  Re-estimated needs:  Kcal: 1785 Protein: 105-120 gm Fluid: 1.8-2 L  Skin: WDL  Diet Order: Diet regular   Intake/Output Summary (Last 24 hours) at  08/05/14 0956 Last data filed at 08/05/14 0900  Gross per 24 hour  Intake    960 ml  Output   1301 ml  Net   -341 ml    Last BM: 12/11   Labs:   Recent Labs Lab 07/31/14 0213  07/31/14 2003 08/01/14 0233 08/01/14 0730 08/05/14 0334  NA 140  --   --  140  --  138  K 3.5*  --   --  3.3*  --  3.6  CL 103  --   --  101  --  100  CO2 24  --   --  29  --  30  BUN 20  --   --  15  --  8  CREATININE 0.68  --   --  0.65  --  0.67  CALCIUM 9.4  --   --  9.4  --  9.6  MG  --   < > 2.1  --  2.1 2.0  PHOS  --   < > 2.8  --  4.1 4.2  GLUCOSE 98  --   --  100*  --  113*  < > = values in this interval not displayed.  CBG (last 3)   Recent Labs  08/02/14 1128  GLUCAP 97    Scheduled Meds: . clonazePAM  1 mg Oral BID  . docusate  100 mg Oral BID  . enoxaparin (LOVENOX) injection  40 mg Subcutaneous Q24H  . gabapentin  300 mg  Oral BID   And  . gabapentin  200 mg Oral Q24H  . ipratropium  0.5 mg Nebulization Q6H  . levalbuterol  0.63 mg Nebulization Q6H  . zolpidem  5 mg Oral QHS    Continuous Infusions:     Past Medical History  Diagnosis Date  . Asthma   . Anxiety   . COPD (chronic obstructive pulmonary disease)   . Bronchitis   . Heart murmur   . Shortness of breath      Loyce DysKacie Kaoru Benda, MS RD LDN Clinical Inpatient Dietitian Weekend/After hours pager: [phone]

## 2014-08-05 NOTE — Progress Notes (Signed)
MD notified of pt's concerns regarding meds. No order changes. Will continue to monitor pt.

## 2014-08-05 NOTE — Progress Notes (Signed)
PULMONARY / CRITICAL CARE MEDICINE    Name: Julie Kerr MRN: [id number] DOB: 11/13/1975    ADMISSION DATE:  07/25/2014  PRIMARY SERVICE: PCCM  CHIEF COMPLAINT:  Dyspnea  BRIEF PATIENT DESCRIPTION: 38 y/o woman w/ hx of asthma who presents with respiratory distress due to significant gap metabolic acidosis. She was intubated, to ICU. Was extubated 12/2 and sent to floor. 12/14 she developed acute onset respiratory distress, transferred to ICU and placed on BiPAP.   SIGNIFICANT EVENTS / STUDIES:  12/11 Intubated in ED 12/12- awake, weaning, extubated 12/13 - to floor, TRH 12/14 - respiratory distress, back to ICU,BiPAP - fully resolved 12/15- re intubated 12/16- CT neck noncontrast>>neg 12/17 Trach tube placement 12/18 Comfortable on ATC. SLP eval ordered. Transfer to SDU ordered   INTERVAL:   Tolerating ATC. RASS 0.eating ok.  C/o back pain , not tol PMV with #6 trach  VITAL SIGNS: Temp:  [98.2 F (36.8 C)-99.3 F (37.4 C)] 98.2 F (36.8 C) (12/22 1142) Pulse Rate:  [93-117] 98 (12/22 1142) Resp:  [13-25] 25 (12/22 1142) BP: (90-126)/(51-84) 90/51 mmHg (12/22 1142) SpO2:  [93 %-100 %] 100 % (12/22 1142) FiO2 (%):  [28 %] 28 % (12/22 1142) Weight:  [85.5 kg (188 lb 7.9 oz)] 85.5 kg (188 lb 7.9 oz) (12/22 0500)    VENTILATOR SETTINGS: Vent Mode:  [-]  FiO2 (%):  [28 %] 28 % INTAKE / OUTPUT: Intake/Output      12/21 0701 - 12/22 0700 12/22 0701 - 12/23 0700   P.O. 1080 120   Total Intake(mL/kg) 1080 (12.6) 120 (1.4)   Urine (mL/kg/hr) 1901 (0.9)    Total Output 1901     Net -821 +120          PHYSICAL EXAMINATION: General:  NAD, asleep and refusing to wake up and communicate Neuro:  Nonfocal HEENT: Trach site clean Cardiovascular:  Reg, no M Lungs:  Clear Abdomen:  Obese, +bs, no r/g Ext: no edema  LABS:  Recent Labs Lab 07/31/14 0213 08/01/14 0233 08/05/14 0334  NA 140 140 138  K 3.5* 3.3* 3.6  CL 103 101 100  CO2 24 29 30   BUN 20 15 8    CREATININE 0.68 0.65 0.67  GLUCOSE 98 100* 113*    Recent Labs Lab 07/31/14 0213 08/01/14 0233 08/05/14 0334  HGB 9.6* 10.3* 11.4*  HCT 31.1* 33.0* 36.1  WBC 13.2* 9.7 8.7  PLT 308 323 446*   ASSESSMENT: Acute respiratory failure   Severe VCD Atelectasis, resolved Hx of asthma  QTC prolongation, resolved Cocaine abuse Hypokalemia, rescurrent Leukocytosis, resolved No evidence infection Hyperglycemia, resolved Acute encephelopathy- resolved Chronic pain syndrome Chronic opiate use Polysubstance abuse  Discussion No distress. Remains off vent   Plan SLP eval for PMV and swallowing all passed. PO diet Cont home opiate regimen Post op day 6 trach.  Patient requesting downsize, will downsize today to 4 cuffless then push PMV and rehab. Will need DC planning to address trach tube care at home, CSW toa ddress. Long term mgmt plan for VCD needs to be defined as outpatient. PT/OT evaluation ordered to assess home needs. Awaiting discharge planning.  Alyson ReedyWesam G. Yacoub, M.D. [facility]. Pager: [phone]. After hours pager: [phone].  08/05/2014, 12:53 PM

## 2014-08-05 NOTE — Plan of Care (Signed)
Problem: Phase I Progression Outcomes Goal: Pain controlled with appropriate interventions Outcome: Progressing Pt is receiving Q 4 morphine (MSIR) with some relief but pain is not completely gone. Pt is still experiencing back pain and tooth/left cheek pain.

## 2014-08-05 NOTE — Procedures (Signed)
First Trach Change  Patient positioned.  Trach sutures removed.  Tube changer inserted.  Size 6 trach (cuff down) removed and 4 cuffless shiley placed with good sat and good color change.  Alyson ReedyWesam G. Knoxx Boeding, M.D. [facility]/Critical [facility]. Pager: [phone]. After hours pager: [phone].

## 2014-08-05 NOTE — Clinical Social Work Note (Signed)
Clinical Social Work Department BRIEF PSYCHOSOCIAL ASSESSMENT 08/05/2014  Patient:  Julie Kerr,Palma     Account Number:  [id number]     Admit date:  07/25/2014  Clinical Social Worker:  Merlyn LotHOLOMAN,Albertia Carvin, CLINICAL SOCIAL WORKER  Date/Time:  08/05/2014 02:16 PM  Referred by:  Physician  Date Referred:  08/05/2014 Referred for  SNF Placement   Other Referral:   Interview type:  Patient Other interview type:   Patients wife was also on the phone during the interview    PSYCHOSOCIAL DATA Living Status:  SIGNIFICANT OTHER Admitted from facility:   Level of care:   Primary support name:  Luellen PuckerLarryssa Primary support relationship to patient:  SPOUSE Degree of support available:   Patient reports high level of support from wife and from patients children.  Patient also reports having a home health aid that comes into the house 7days a week 2.5 hours a day.    CURRENT CONCERNS Current Concerns  Post-Acute Placement   Other Concerns:    SOCIAL WORK ASSESSMENT / PLAN CSW spoke to patient concerning SNF placement after discharge from hospital.  Patient states that she will talk about it with her wife but states that she doesn't think she needs help- reports high physicial functioning (RN confirms pt is able to move well but did have a fall yesterday).  Patient inquired whether home health agency would be able to spend more time at the house to help aid with trach- RNCM to follow up with home health agency.  CSW will continue to follow- patient is currently refusing SNF search until further information can be obtained.   Assessment/plan status:  Psychosocial Support/Ongoing Assessment of Needs Other assessment/ plan:   FL2  PASAR   Information/referral to community resources:    PATIENT'S/FAMILY'S RESPONSE TO PLAN OF CARE: Patient is not agreeable to SNF placement at this time and would prefer to look into home health options before searching for SNF bed.       Merlyn LotJenna Holoman,  [name] Clinical Social Worker [phone]

## 2014-08-05 NOTE — Evaluation (Signed)
Physical Therapy Evaluation Patient Details Name: Julie Kerr MRN: [id number] DOB: 08/12/1976 Today's Date: 08/05/2014   History of Present Illness  38 y/o woman w/ hx of asthma who presents with respiratory distress due to significant gap metabolic acidosis, intubated on 12/11 and extubated on 12/12; reintubated on 12/15 and trach tube placed on 12/17. On 12/20 patient with fall resulting in left cheek pain and lose tooth.  Clinical Impression  Patient demonstrates deficits in functional mobility as indicated below. Will need continued skilled PT to address deficits and maximize function. Will see as indicated and progress as tolerated. At this time, patient at distant supervision levels, however, given recent fall during admission, will continue to see patient for continued progression of mobility and safety to decreased risk of falls.    Follow Up Recommendations Supervision for mobility/OOB    Equipment Recommendations  None recommended by PT    Recommendations for Other Services       Precautions / Restrictions Precautions Precautions: Fall Restrictions Weight Bearing Restrictions: No      Mobility  Bed Mobility Overal bed mobility: Modified Independent             General bed mobility comments: increased time to perform no physical assist needed  Transfers Overall transfer level: Needs assistance Equipment used: Rolling walker (2 wheeled) Transfers: Sit to/from Stand Sit to Stand: Supervision         General transfer comment: no physical assist required  Ambulation/Gait Ambulation/Gait assistance: Supervision Ambulation Distance (Feet): 140 Feet (2 standing rest breaks for review of vitals; VSS SpO2 >92%) Assistive device: Rolling walker (2 wheeled);None Gait Pattern/deviations: Step-through pattern;Drifts right/left Gait velocity: decreased Gait velocity interpretation: Below normal speed for age/gender General Gait Details: Patient ambulated on room  air with SpO2 monitored closely and remained >91% throughout activity  Stairs            Wheelchair Mobility    Modified Rankin (Stroke Patients Only)       Balance Overall balance assessment: History of Falls (fall during hospitalization)                                           Pertinent Vitals/Pain Pain Assessment: Faces Faces Pain Scale: Hurts little more Pain Location: neck, face and low back region, no values given Pain Intervention(s): Monitored during session;Repositioned    Home Living Family/patient expects to be discharged to:: Private residence Living Arrangements: Spouse/significant other;Children Available Help at Discharge: Family;Friend(s);Personal care attendant Type of Home: House Home Access: Stairs to enter     Home Layout: One level Home Equipment: Walker - standard;Shower seat;Bedside commode Additional Comments: has hand held shower head    Prior Function Level of Independence: Independent with assistive device(s)         Comments: assist 7 days/wk with bathing, meal, dressing, home mgt     Hand Dominance   Dominant Hand: Right    Extremity/Trunk Assessment   Upper Extremity Assessment: Overall WFL for tasks assessed           Lower Extremity Assessment: Overall WFL for tasks assessed         Communication   Communication: No difficulties  Cognition Arousal/Alertness: Awake/alert Behavior During Therapy: WFL for tasks assessed/performed Overall Cognitive Status: Within Functional Limits for tasks assessed  General Comments General comments (skin integrity, edema, etc.): Vitals assessed x3 during session, BP stable, SpO2 on ATC 98%, during activity patient on room air, with SpO2 monitored closely with O2 dropping to 92%. Patient performed various self care activities during session.  Able to move outside BOS and demonstrates no need for physical assist during higher level  balance activities.     Exercises        Assessment/Plan    PT Assessment Patient needs continued PT services  PT Diagnosis Difficulty walking   PT Problem List Decreased activity tolerance;Decreased mobility;Cardiopulmonary status limiting activity;Pain  PT Treatment Interventions DME instruction;Gait training;Stair training;Functional mobility training;Therapeutic activities;Therapeutic exercise;Balance training;Patient/family education   PT Goals (Current goals can be found in the Care Plan section) Acute Rehab PT Goals Patient Stated Goal: to go home PT Goal Formulation: With patient Time For Goal Achievement: 08/19/14 Potential to Achieve Goals: Good    Frequency Min 3X/week   Barriers to discharge        Co-evaluation               End of Session Equipment Utilized During Treatment: Gait belt;Oxygen Activity Tolerance: Patient tolerated treatment well Patient left: in bed;with call bell/phone within reach (MD present upon conclusion of session) Nurse Communication: Mobility status         Time: [id number] PT Time Calculation (min) (ACUTE ONLY): 27 min   Charges:   PT Evaluation $Initial PT Evaluation Tier I: 1 Procedure PT Treatments $Gait Training: 8-22 mins $Therapeutic Activity: 8-22 mins   PT G CodesFabio Asa:          Allah Reason J 08/05/2014, 3:51 PM Charlotte Crumbevon Nalani Andreen, PT DPT  [phone]

## 2014-08-06 ENCOUNTER — Telehealth: Payer: Self-pay | Admitting: Pulmonary Disease

## 2014-08-06 MED ORDER — STERILE WATER FOR IRRIGATION IR SOLN: 1000.0000 mL | Freq: Once | Status: DC

## 2014-08-06 MED ORDER — OXYCODONE HCL 5 MG PO TABS
5.0000 mg | ORAL_TABLET | Freq: Four times a day (QID) | ORAL | Status: DC | PRN
Start: 2014-08-06 — End: 2014-08-06
  Administered 2014-08-06: 5 mg via ORAL
  Filled 2014-08-06: qty 1

## 2014-08-06 MED ORDER — OXYCODONE HCL 5 MG PO TABS
15.0000 mg | ORAL_TABLET | Freq: Four times a day (QID) | ORAL | Status: DC | PRN
  Administered 2014-08-06: 15 mg via ORAL
  Filled 2014-08-06: qty 3

## 2014-08-06 MED ORDER — LEVALBUTEROL HCL 0.63 MG/3ML IN NEBU: 0.6300 mg | INHALATION_SOLUTION | Freq: Four times a day (QID) | RESPIRATORY_TRACT | Status: DC

## 2014-08-06 MED ORDER — ALPRAZOLAM 1 MG PO TABS: 1.0000 mg | ORAL_TABLET | Freq: Three times a day (TID) | ORAL | Status: DC | PRN

## 2014-08-06 MED ORDER — OXYCODONE HCL 15 MG PO TABS: 15.0000 mg | ORAL_TABLET | Freq: Four times a day (QID) | ORAL | Status: DC | PRN

## 2014-08-06 MED ORDER — STERILE WATER FOR IRRIGATION IR SOLN: Freq: Once | Status: DC

## 2014-08-06 MED ORDER — ZOLPIDEM TARTRATE 5 MG PO TABS: 5.0000 mg | ORAL_TABLET | Freq: Every day | ORAL | Status: DC

## 2014-08-06 MED ORDER — IPRATROPIUM BROMIDE 0.02 % IN SOLN: 0.5000 mg | Freq: Four times a day (QID) | RESPIRATORY_TRACT | Status: DC

## 2014-08-06 MED ORDER — ALBUTEROL SULFATE (2.5 MG/3ML) 0.083% IN NEBU: 2.5000 mg | INHALATION_SOLUTION | RESPIRATORY_TRACT | Status: DC | PRN

## 2014-08-06 NOTE — Progress Notes (Signed)
Pt seen at this time for trach education.  Plan is for pt to be discharged home today.  Pt's significant other and one friend at bedside for education.  Pt is primary learner.  Trach education book given to pt and went over trach care, suction, inner cannula, trach ties in detail.  Pt had mirror and was able to demonstrate everything back very well.  Pt has no questions at this time.  Has follow up appt at trach clinic 09/03/14.

## 2014-08-06 NOTE — Discharge Summary (Signed)
Physician Discharge Summary       Patient ID: Julie Kerr MRN: [id number] DOB/AGE: 36/04/1976 36 y.o.  Admit date: 07/25/2014 Discharge date: 08/06/2014  Discharge Diagnoses:  Principal Problem:   Acute respiratory failure with hypoxia Active Problems:   Chronic pain syndrome   Upper airway cough syndrome, severe, with clinical VCD   HCAP (healthcare-associated pneumonia)   Polysubstance abuse   Metabolic acidosis   Asthma exacerbation   Acute respiratory failure   Cough   Respiratory failure   Tracheostomy status   Detailed Hospital Course:   38 y/o woman w/ hx of asthma (s/p intubation x3), tobacco abuse, polysubstance abuse, and chronic pain who presented to the ED 12/11 with worsening dyspnea that she thought was due to an asthma exacerbation. The patient was reportedly in her usual state of health until mid-day when she felt more dyspnic. This progressed until her partner came home at around 16:45 and she was significantly more dyspneic, and was taken to the ED. She was found to be acidotic, and was placed on BiPAP to help correct this, but she did not tolerate the mask and was intubated. She was admitted to ICU under PCCM service. By 12/12 AM she was successfully weaning from vent and was bale to be extubated. She was transferred to floor. 12/14 she had additional respiratory distress which was all upper airway and related to VCD. She was placed on BiPAP and treated for stridor and symptoms improved. 12/15 she again developed upper airway distress and required intubation once again. She was not able to wean and had percutaneous tracheostomy placed 12/17. She was quickly able to tolerate trach collar and eventually PMV. She is managed at pain clinic and was started on her home opiate regimen at a reduced dose which she is tolerating well. Had trach changed to 4 cuffless shiley 12/22.  12/23 she is deemed a candidate for discharge as she has achieved maximum benefit from inpatient  care.    Discharge Plan by active problems  Acute respiratory failure  Severe VCD Hx of asthma  Chronic pain syndrome Chronic opiate use Polysubstance abuse   Plan    Significant Hospital tests/ studies  12/16- CT neck noncontrast>>neg  Consults  None  Discharge Exam: BP 93/50 mmHg  Pulse 91  Temp(Src) 98.9 F (37.2 C) (Oral)  Resp 17  Ht 5\' 3"  (1.6 m)  Wt 85.5 kg (188 lb 7.9 oz)  BMI 33.40 kg/m2  SpO2 99%  LMP 07/15/2014  General: NAD, ambulating with walker Neuro: Alert, oriented, nonfocal HEENT: Trach site clean Cardiovascular: Reg, no MRG Lungs: Clear Abdomen: Obese, +bs, no r/g Ext: no edema  Labs at discharge Lab Results  Component Value Date   CREATININE 0.67 08/05/2014   BUN 8 08/05/2014   NA 138 08/05/2014   K 3.6 08/05/2014   CL 100 08/05/2014   CO2 30 08/05/2014   Lab Results  Component Value Date   WBC 8.7 08/05/2014   HGB 11.4* 08/05/2014   HCT 36.1 08/05/2014   MCV 79.9 08/05/2014   PLT 446* 08/05/2014   Lab Results  Component Value Date   ALT 11 07/26/2014   AST 15 07/26/2014   ALKPHOS 55 07/26/2014   BILITOT <0.2* 07/26/2014   Lab Results  Component Value Date   INR 1.06 07/30/2014   INR 1.17 07/25/2014    Current radiology studies No results found.  Disposition:  01-Home or Self Care      Discharge Instructions    Call MD for:  difficulty breathing, headache or visual disturbances    Complete by:  As directed      Call MD for:  persistant nausea and vomiting    Complete by:  As directed      Call MD for:  redness, tenderness, or signs of infection (pain, swelling, redness, odor or green/yellow discharge around incision site)    Complete by:  As directed      Call MD for:  temperature >100.4    Complete by:  As directed      Diet - low sodium heart healthy    Complete by:  As directed      Increase activity slowly    Complete by:  As directed             Medication List    STOP taking these  medications        guaiFENesin-codeine 100-10 MG/5ML syrup  Commonly known as:  ROBITUSSIN AC     mometasone-formoterol 100-5 MCG/ACT Aero  Commonly known as:  DULERA     morphine 30 MG tablet  Commonly known as:  MSIR     multivitamin with minerals tablet     tiotropium 18 MCG inhalation capsule  Commonly known as:  SPIRIVA     VERAPAMIL HCL PO      TAKE these medications        albuterol (2.5 MG/3ML) 0.083% nebulizer solution  Commonly known as:  PROVENTIL  Take 3 mLs (2.5 mg total) by nebulization every 2 (two) hours as needed for wheezing or shortness of breath.     alprazolam 2 MG tablet  Commonly known as:  XANAX  Take 1 tablet (2 mg total) by mouth 3 (three) times daily as needed for anxiety.     diphenhydrAMINE 25 MG tablet  Commonly known as:  BENADRYL  Take 25-50 mg by mouth every 6 (six) hours as needed for itching.     ENSURE  Take 237 mLs by mouth 3 (three) times daily between meals.     gabapentin 100 MG capsule  Commonly known as:  NEURONTIN  Take 200-300 mg by mouth 3 (three) times daily. 300mg  in the morning, 200mg  in the afternoon and 300mg  at bedtime.     ipratropium 0.02 % nebulizer solution  Commonly known as:  ATROVENT  Take 2.5 mLs (0.5 mg total) by nebulization every 6 (six) hours.     levalbuterol 0.63 MG/3ML nebulizer solution  Commonly known as:  XOPENEX  Take 3 mLs (0.63 mg total) by nebulization every 6 (six) hours.     nicotine 21 mg/24hr patch  Commonly known as:  NICODERM CQ - dosed in mg/24 hours  Place 1 patch (21 mg total) onto the skin daily.     oxyCODONE 15 MG immediate release tablet  Commonly known as:  ROXICODONE  Take 1 tablet (15 mg total) by mouth every 6 (six) hours as needed.     zolpidem 5 MG tablet  Commonly known as:  AMBIEN  Take 1 tablet (5 mg total) by mouth at bedtime.       Follow-up Information    Follow up with [facility]. Schedule an appointment as soon as  possible for a visit on 09/03/2014.   Specialty:  Respiratory Therapy   Why:  4:00 PM @ [facility]   Contact information:   [address] [place] [id number] mc [place] [address] [phone]      Discharged Condition: good  Joneen RoachPaul Hoffman, AGACNP-BC [facility] Pulmonology/Critical Care Pager [phone] or [phone]

## 2014-08-06 NOTE — Progress Notes (Signed)
Discharge instructions educated with patient and patient verbalizes understanding. Pt safely transported to vehicle.

## 2014-08-06 NOTE — Progress Notes (Signed)
SATURATION QUALIFICATIONS: (This note is used to comply with regulatory documentation for home oxygen)  Patient Saturations on Room Air at Rest =100%  Patient Saturations on Room Air while Ambulating = 81%  Patient Saturations on 5 Liters of oxygen while Ambulating =97%  Please briefly explain why patient needs home oxygen: Respiratory failure

## 2014-08-06 NOTE — Progress Notes (Signed)
Speech Language Pathology Treatment: Julie Kerr Speaking valve  Patient Details Name: Julie Kerr MRN: [id number] DOB: [birth year] Today's Date: 08/06/2014 Time: 7907-9310 SLP Time Calculation (min) (ACUTE ONLY): 14 min  Assessment / Plan / Recommendation Clinical Impression  SLP arrived and pt walking around room with family packing for discharge this afternoon.  Session focused on PMSV education. SLP provided minimal visual demonstration and observation of pt donning and doffing valve, cleaning, frequency of wear (doff during sleep and donn with all po's). SLP placed leash on valve for easy access and prevent losing valve. Pt demonstrated without difficulty.  Goals met and being discharged today.   HPI HPI: 38 y/o woman w/ hx of COPD, anxiety, heart murmur, bronchitis, polysubstance abuse, spinal fusion in lower back, asthma, ? VCD,  who presents with respiratory distress due to significant gap metabolic acidosis. She was intubated, to ICU. Was extubated 12/2 and sent to floor. 12/14 she developed acute onset respiratory distress, transferred to ICU and placed on BiPAP. Re-intubated 12/15/ S/p tracheostomy 12/17.    Pertinent Vitals Pain Assessment: No/denies pain  SLP Plan  Discharge SLP treatment due to (comment);All goals met    Recommendations        Patient may use Passy-Muir Speech Valve: During all waking hours (remove during sleep) PMSV Supervision:  (none)       Oral Care Recommendations: Oral care BID Follow up Recommendations: Home health SLP Plan: Discharge SLP treatment due to (comment);All goals met    GO     Julie Kerr 08/06/2014, 4:05 PM  Julie Kerr.Ed [employer] [phone]

## 2014-08-06 NOTE — Progress Notes (Signed)
eLink Physician-Brief Progress Note Patient Name: Adele Schildermanda Millman DOB: 10/08/1975 MRN: [id number]   Date of Service  08/06/2014  HPI/Events of Note  C/o pain -tooth   eICU Interventions  Dc dialudid Resume oxycodone (home med) reduced dose     Intervention Category Intermediate Interventions: Pain - evaluation and management  Nour Scalise V. 08/06/2014, 5:41 AM

## 2014-08-06 NOTE — Telephone Encounter (Signed)
Pt was admitted with severe VCD and had tracheostomy.  She was d/c home on 08/06/14.    In d/c instructions she was to be on alprazolam 2 mg tid prn, oxycodone immediate release 15 mg q6h prn, and zolpidem 5 mg qhs prn.  She says her daughter took scripts to pharmacy, but was only given solution for nebulizer machine.  Her pharmacy is now closed.  Advised her to try taking tylenol as needed for pain symptoms for now.  She has allergy to NSAIDs.  Will route message to triage to follow up with pt in AM to determine where she can get her prescriptions filled.  Pt was seen by Dr. Molli KnockYacoub while in hospital and was instructed to follow up with tracheostomy clinic.  She has also been previously seen by Dr. Sherene SiresWert in pulmonary office (last seen by Dr. Sherene SiresWert on 12/04/13).

## 2014-08-07 NOTE — Telephone Encounter (Signed)
Called and left message for pt. Called EC, Erie NoeVanessa and was another given the number for pt- [phone], unable to leave VM on this number.

## 2014-08-13 ENCOUNTER — Telehealth: Payer: Self-pay | Admitting: Pulmonary Disease

## 2014-08-13 DIAGNOSIS — J9601 Acute respiratory failure with hypoxia: Secondary | ICD-10-CM

## 2014-08-13 NOTE — Telephone Encounter (Signed)
Is this a request for tracheostomy tube change, or what is meant by "emergency trach" ? Ok to order HHN/ DME to change out current tracheostomy tube if necessary Ok to script for requested bottles of saline. When possible, prefer that trach tube changes be handled through our tracheostomy clinic by prior arrangement. They need to know how to contact that clinic scheduler (I don't know how that is done).

## 2014-08-13 NOTE — Telephone Encounter (Signed)
Spoke with [facility](Megan) Needs order for Emergency trach and sterile water( 3-4 bottles, 1000mL each) This cannot wait until McQuaid returns next week.  Please advise Young if you can approve this patient as Dr Kendrick FriesMcQuaid is not in office until next week. Thanks. [facility](Megan aware that this will be handled first thing in the morning as all physicians are gone for the night)

## 2014-08-14 NOTE — Telephone Encounter (Signed)
lmtcb for Megan.  

## 2014-08-18 NOTE — Telephone Encounter (Signed)
Spoke with Aundra Millet and she  Advised that we only need to send order to [facility] for sterile water and that [facility] has already sent the emergency trach.  Order placed for sterile water.

## 2014-08-18 NOTE — Telephone Encounter (Signed)
lmomtcb on Megan's named VM

## 2014-08-18 NOTE — Telephone Encounter (Signed)
I spoke with Julie Millet and clarified what exactly is needed. She states that they want to have a trach on hand in case there is an emergency. The pt does not need a trach change they just want to have an extra on hand. She states she currently has a #4 Shiley trach. I advised ok, we will send the order over to [facility] as requested. I am going to send this message back into triage to place the order due to phone coverage.

## 2014-08-19 ENCOUNTER — Telehealth: Payer: Self-pay | Admitting: Internal Medicine

## 2014-08-19 NOTE — Telephone Encounter (Signed)
Also, patient states she is having trouble with her breathing and wants to be seen here for it. Pt has been added to MW's schedule tomorrow.   Nurse then stated patient is having low BP readings with this all; she has made patient aware to go to nearest ER through the night if worsens.

## 2014-08-20 ENCOUNTER — Ambulatory Visit (INDEPENDENT_AMBULATORY_CARE_PROVIDER_SITE_OTHER): Payer: Medicaid Other | Admitting: Internal Medicine

## 2014-08-20 ENCOUNTER — Encounter: Payer: Self-pay | Admitting: Internal Medicine

## 2014-08-20 VITALS — BP 100/60 | HR 72 | Temp 99.0°F | Ht 62.0 in | Wt 179.0 lb

## 2014-08-20 DIAGNOSIS — R058 Other specified cough: Secondary | ICD-10-CM

## 2014-08-20 DIAGNOSIS — J9601 Acute respiratory failure with hypoxia: Secondary | ICD-10-CM

## 2014-08-20 DIAGNOSIS — R05 Cough: Secondary | ICD-10-CM

## 2014-08-20 DIAGNOSIS — J449 Chronic obstructive pulmonary disease, unspecified: Secondary | ICD-10-CM

## 2014-08-20 DIAGNOSIS — G894 Chronic pain syndrome: Secondary | ICD-10-CM

## 2014-08-20 MED ORDER — MOMETASONE FURO-FORMOTEROL FUM 100-5 MCG/ACT IN AERO: INHALATION_SPRAY | RESPIRATORY_TRACT | Status: DC

## 2014-08-20 NOTE — Patient Instructions (Addendum)
Use humidity and 02 at bedtime  Dulera 100 Take 2 puffs first thing in am and then another 2 puffs about 12 hours later.    During the day if get short of breath and you can't recover then use your proair  first and if not effective then use your nebulizer up to every 4 hours   See Tammy NP w/in 2 weeks with all your medications, even over the counter meds, separated in two separate bags, the ones you take no matter what vs the ones you stop once you feel better and take only as needed when you feel you need them.   Tammy  will generate for you a new user friendly medication calendar that will put us all on the same page re: your medication use.     Without this process, it simply isn't possible to assure that we are providing  your outpatient care  with  the attention to detail we feel you deserve.   If we cannot assure that you're getting that kind of care,  then we cannot manage your problem effectively from this clinic.  Once you have seen Tammy and we are sure that we're all on the same page with your medication use she will arrange follow up with me.  Note given dulera 100 #62 puffs check counter on return  Needs cxr on return if not doing better

## 2014-08-20 NOTE — Progress Notes (Signed)
Subjective:    Patient ID: Julie Kerr, female    DOB: 05/05/1976    MRN: [id number]    1934 yobf with asthma since age 89 on inhalers daily quit smoking  06/2014 but if anything worse since   Referred to pulmonary clinc 12/04/13  by [facility] after [facility] with suspected vcd and ultimately required trach 07/31/14     12/04/2013 1st [facility] office visit/ Julie Kerr  Chief Complaint  Patient presents with  . Pulmonary Consult    Referred per Dr. Blake DivineAkula. Pt reports dx of COPD approx 10 yrs ago. She c/o increased SOB and cough x 5 days. SOB bothers her all of the time- made worse by anxiety.  Cough is prod occ with minimal yellow/blood tinged sputum.  She is using rescue inhaler on average 6 x per day and using albuterol neb 4 x per day.  last did well for a couple of weeks while on "strong cough med with codeine" = tussionex per d/c instructions plus xanax - prednisone and inhalers have stopped working for her Last used neb around 1.5 h prior to OV    02/24/2014 Acute OV /NP Pt returns for persistent cough , congestion, barking cough and wheezing . Complains of  Chest congestion, sinus pressure, PND, sore throat and ear pain. Complains of asthma flare for last few days.  Was admitted 2 weeks for similar symptoms . Tx w/ IV steroids and Nebs. No better with treatment. Feels she  Cant stop wheezing or coughing.  Very anxious and upset today stating she is out of her pain meds and anxiety meds.  Unfortunately she continues to smoker.  She does not have her inhalers -Dulera.  She has Advair and [place] which are old. And she was discharged on Dulera from hospital 2 weeks ago.  Pt has difficulty with VCD complicated by anxiety and chronic pain .  She has a barking cough and upper airway wheezing throughout exam unless she is talking then at times it goes away.  Does not improve with Xopenex neb in office. rec Restart Dulera 100/65mcg 2 puffs Twice daily  , rinse after use.  Stop  Advair and Tudorza.  Discuss with family doctor regarding pain meds and anxiety meds .    Admit date: 07/25/2014 Discharge date: 08/06/2014  Discharge Diagnoses:  Principal Problem:  Acute respiratory failure with hypoxia   Chronic pain syndrome  Upper airway cough syndrome, severe, with clinical VCD  HCAP (healthcare-associated pneumonia)  Polysubstance abuse  Metabolic acidosis  Asthma exacerbation  Acute respiratory failure  Cough  Respiratory failure  Tracheostomy placed 07/31/14    Detailed Hospital Course:  39 y/o woman w/ hx of asthma (s/p intubation x3), tobacco abuse, polysubstance abuse, and chronic pain who presented to the ED 12/11 with worsening dyspnea that she thought was due to an asthma exacerbation. The patient was reportedly in her usual state of health until mid-day when she felt more dyspnic. This progressed until her partner came home at around 16:45 and she was significantly more dyspneic, and was taken to the ED. She was found to be acidotic, and was placed on BiPAP to help correct this, but she did not tolerate the mask and was intubated. She was admitted to ICU under PCCM service. By 12/12 AM she was successfully weaning from vent and was bale to be extubated. She was transferred to floor. 12/14 she had additional respiratory distress which was all upper airway and related to VCD. She was placed  on BiPAP and treated for stridor and symptoms improved. 12/15 she again developed upper airway distress and required intubation once again. She was not able to wean and had percutaneous tracheostomy placed 12/17. She was quickly able to tolerate trach collar and eventually PMV. She is managed at pain clinic and was started on her home opiate regimen at a reduced dose which she is tolerating well. Had trach changed to 4 cuffless shiley 12/22. 12/23 she is deemed a candidate for discharge as she has achieved maximum benefit from inpatient care.     08/20/2014 post hosp f/u ov/Julie Kerr re:  Chief Complaint  Patient presents with  . Acute Visit    Pt states that she is having increased cough and SOB since she ran out of xanax and oxycodone 3 days ago. She feels like trach is causing alot of pressure in her throat. Sputum is clear. She has had fever for the past 2 days. She c/o pain in her back and her legs.   no purulent sputum, n or v  Does fine with pmv out, loud pseudowheeze with it in     No obvious day to day or daytime variabilty or assoc  cp or chest tightness, subjective wheeze overt sinus or hb symptoms. No unusual exp hx or h/o childhood pna/ asthma or knowledge of premature birth.  Sleeping ok without nocturnal  or early am exacerbation  of respiratory  c/o's or need for noct saba. Also denies any obvious fluctuation of symptoms with weather or environmental changes or other aggravating or alleviating factors except as outlined above   Current Medications, Allergies, Complete Past Medical History, Past Surgical History, Family History, and Social History were reviewed in [facility] electronic medical record.  ROS  The following are not active complaints unless bolded sore throat, dysphagia, dental problems, itching, sneezing,  nasal congestion or excess/ purulent secretions, ear ache,   fever, chills, sweats, unintended wt loss, pleuritic or exertional cp, hemoptysis,  orthopnea pnd or leg swelling, presyncope, palpitations, heartburn, abdominal pain, anorexia, nausea, vomiting, diarrhea  or change in bowel or urinary habits, change in stools or urine, dysuria,hematuria,  rash, arthralgias, visual complaints, headache, numbness weakness or ataxia or problems with walking or coordination,  change in mood/affect or memory.          Objective:   Physical Exam  amb  Mod obese bf with moderate   pseudowheeze with trach plug in almost completely gone with PMV out    Wt Readings from Last 3 Encounters:  08/20/14 179 lb (81.194  kg)  08/05/14 188 lb 7.9 oz (85.5 kg)  06/02/14 176 lb 12.8 oz (80.196 kg)    Vital signs reviewed  - note sats 100%    HEENT: nl dentition, turbinates, and orophanx. Nl external ear canals without cough reflex   NECK :  without JVD/Nodes/TM/ nl carotid upstrokes bilaterally   LUNGS: no acc muscle use, mainly clear w/ pursed lip exhalation , however loud referred upper airway psuedowheezing  CV:  RRR  no s3 or murmur or increase in P2, no edema   ABD:  soft and nontender with nl excursion in the supine position. No bruits or organomegaly, bowel sounds nl  MS:  warm without deformities, calf tenderness, cyanosis or clubbing  SKIN: warm and dry without lesions  , several tatoos.   NEURO:  alert, approp, no deficits    cxr 07/31/14 No acute abnormality noted  08/20/14 cxr requested, pt did not go     Assessment &  Plan:

## 2014-08-21 ENCOUNTER — Encounter: Payer: Self-pay | Admitting: Internal Medicine

## 2014-08-21 DIAGNOSIS — J449 Chronic obstructive pulmonary disease, unspecified: Secondary | ICD-10-CM | POA: Insufficient documentation

## 2014-08-21 DIAGNOSIS — J4489 Other specified chronic obstructive pulmonary disease: Secondary | ICD-10-CM | POA: Insufficient documentation

## 2014-08-21 NOTE — Assessment & Plan Note (Signed)
Resolved s/p tach 07/31/14

## 2014-08-21 NOTE — Assessment & Plan Note (Signed)
Deferred all rx for benzo's and narcs to primary and pain clinic

## 2014-08-21 NOTE — Assessment & Plan Note (Signed)
-   left AMA 01/20/14 with admit due to sob when pain meds denied/ offered help by psych/ social worker/chaplain>  was not forthcoming with accurate med hx - resolved with Trach 07/31/14   I had an extended discussion with the patient reviewing all relevant studies completed to date and  lasting 15 to 20 minutes of a 25 minute visit on the following ongoing concerns:   I am willing to help her in this clinic but only if she'll meet us half way and be honest /accurate with med hx   To keep things simple, I have asked the patient to first separate medicines that are perceived as maintenance, that is to be taken daily "no matter what", from those medicines that are taken on only on an as-needed basis and I have given the patient examples of both, and then return to see our NP to generate a  detailed  medication calendar which should be followed until the next physician sees the patient and updates it.

## 2014-08-21 NOTE — Assessment & Plan Note (Addendum)
   DDX of  difficult airways management all start with A and  include Adherence, Ace Inhibitors, Acid Reflux, Active Sinus Disease, Alpha 1 Antitripsin deficiency, Anxiety masquerading as Airways dz,  ABPA,  allergy(esp in young), Aspiration (esp in elderly), Adverse effects of DPI,  Active smokers, plus two Bs  = Bronchiectasis and Beta blocker use..and one C= CHF  Adherence is always the initial "prime suspect" and is a multilayered concern that requires a "trust but verify" approach in every patient - starting with knowing how to use medications, especially inhalers, correctly, keeping up with refills and understanding the fundamental difference between maintenance and prns vs those medications only taken for a very short course and then stopped and not refilled.  The proper method of use, as well as anticipated side effects, of a metered-dose inhaler are discussed and demonstrated to the patient. Improved effectiveness after extensive coaching during this visit to a level of approximately  75%  Active smoking > denies since 06/2014 but time will tell > consider check for nicotine metabolites next ov if not doing better   ? Anxiety > usually dx of exclusion but near the top of her short list    Each maintenance medication was reviewed in detail including most importantly the difference between maintenance and as needed and under what circumstances the prns are to be used.  Please see instructions for details which were reviewed in writing and the patient given a copy.

## 2014-08-28 ENCOUNTER — Encounter (HOSPITAL_COMMUNITY): Payer: Self-pay | Admitting: *Deleted

## 2014-08-28 ENCOUNTER — Emergency Department (HOSPITAL_COMMUNITY): Payer: Medicaid Other

## 2014-08-28 ENCOUNTER — Inpatient Hospital Stay (HOSPITAL_COMMUNITY)
Admission: EM | Admit: 2014-08-28 | Discharge: 2014-09-01 | DRG: 190 | Disposition: A | Discharge status: Home Health | Payer: Medicaid Other | Attending: Internal Medicine | Admitting: Internal Medicine

## 2014-08-28 DIAGNOSIS — F410 Panic disorder [episodic paroxysmal anxiety] without agoraphobia: Secondary | ICD-10-CM | POA: Diagnosis present

## 2014-08-28 DIAGNOSIS — Z8249 Family history of ischemic heart disease and other diseases of the circulatory system: Secondary | ICD-10-CM

## 2014-08-28 DIAGNOSIS — Z981 Arthrodesis status: Secondary | ICD-10-CM

## 2014-08-28 DIAGNOSIS — F4322 Adjustment disorder with anxiety: Secondary | ICD-10-CM | POA: Insufficient documentation

## 2014-08-28 DIAGNOSIS — Z823 Family history of stroke: Secondary | ICD-10-CM

## 2014-08-28 DIAGNOSIS — J9601 Acute respiratory failure with hypoxia: Secondary | ICD-10-CM

## 2014-08-28 DIAGNOSIS — Z801 Family history of malignant neoplasm of trachea, bronchus and lung: Secondary | ICD-10-CM

## 2014-08-28 DIAGNOSIS — Z87891 Personal history of nicotine dependence: Secondary | ICD-10-CM

## 2014-08-28 DIAGNOSIS — R059 Cough, unspecified: Secondary | ICD-10-CM

## 2014-08-28 DIAGNOSIS — R05 Cough: Secondary | ICD-10-CM

## 2014-08-28 DIAGNOSIS — J45901 Unspecified asthma with (acute) exacerbation: Secondary | ICD-10-CM | POA: Diagnosis present

## 2014-08-28 DIAGNOSIS — K219 Gastro-esophageal reflux disease without esophagitis: Secondary | ICD-10-CM | POA: Diagnosis present

## 2014-08-28 DIAGNOSIS — J9621 Acute and chronic respiratory failure with hypoxia: Secondary | ICD-10-CM | POA: Insufficient documentation

## 2014-08-28 DIAGNOSIS — G4700 Insomnia, unspecified: Secondary | ICD-10-CM | POA: Diagnosis present

## 2014-08-28 DIAGNOSIS — G609 Hereditary and idiopathic neuropathy, unspecified: Secondary | ICD-10-CM | POA: Insufficient documentation

## 2014-08-28 DIAGNOSIS — F411 Generalized anxiety disorder: Secondary | ICD-10-CM | POA: Diagnosis present

## 2014-08-28 DIAGNOSIS — J441 Chronic obstructive pulmonary disease with (acute) exacerbation: Principal | ICD-10-CM | POA: Diagnosis present

## 2014-08-28 DIAGNOSIS — Z825 Family history of asthma and other chronic lower respiratory diseases: Secondary | ICD-10-CM

## 2014-08-28 DIAGNOSIS — Z930 Tracheostomy status: Secondary | ICD-10-CM

## 2014-08-28 DIAGNOSIS — G894 Chronic pain syndrome: Secondary | ICD-10-CM | POA: Diagnosis present

## 2014-08-28 DIAGNOSIS — J9600 Acute respiratory failure, unspecified whether with hypoxia or hypercapnia: Secondary | ICD-10-CM | POA: Diagnosis present

## 2014-08-28 LAB — BASIC METABOLIC PANEL
ANION GAP: 4 — AB (ref 5–15)
BUN: 14 mg/dL (ref 6–23)
CO2: 23 mmol/L (ref 19–32)
CREATININE: 0.86 mg/dL (ref 0.50–1.10)
Calcium: 9.2 mg/dL (ref 8.4–10.5)
Chloride: 108 mEq/L (ref 96–112)
GFR calc Af Amer: >90 mL/min (ref >90)
GFR, EST NON AFRICAN AMERICAN: 86 mL/min — AB (ref >90)
Glucose, Bld: 97 mg/dL (ref 70–99)
Potassium: 3.5 mmol/L (ref 3.5–5.1)
Sodium: 135 mmol/L (ref 135–145)

## 2014-08-28 LAB — CBC
HEMATOCRIT: 36 % (ref 36.0–46.0)
HEMOGLOBIN: 11.4 g/dL — AB (ref 12.0–15.0)
MCH: 25.1 pg — AB (ref 26.0–34.0)
MCHC: 31.7 g/dL (ref 30.0–36.0)
MCV: 79.3 fL (ref 78.0–100.0)
Platelets: 380 10*3/uL (ref 150–400)
RBC: 4.54 MIL/uL (ref 3.87–5.11)
RDW: 14.7 % (ref 11.5–15.5)
WBC: 8.2 10*3/uL (ref 4.0–10.5)

## 2014-08-28 LAB — I-STAT TROPONIN, ED: Troponin i, poc: 0 ng/mL (ref 0.00–0.08)

## 2014-08-28 MED ORDER — MORPHINE SULFATE 4 MG/ML IJ SOLN
4.0000 mg | Freq: Once | INTRAMUSCULAR | Status: AC
  Administered 2014-08-28: 4 mg via INTRAVENOUS
  Filled 2014-08-28: qty 1

## 2014-08-28 MED ORDER — ALBUTEROL SULFATE (2.5 MG/3ML) 0.083% IN NEBU
5.0000 mg | INHALATION_SOLUTION | Freq: Once | RESPIRATORY_TRACT | Status: AC
  Administered 2014-08-28: 5 mg via RESPIRATORY_TRACT
  Filled 2014-08-28: qty 6

## 2014-08-28 MED ORDER — HYDROGEN PEROXIDE 3 % EX SOLN
CUTANEOUS | Status: AC
  Filled 2014-08-28: qty 473

## 2014-08-28 MED ORDER — METHYLPREDNISOLONE SODIUM SUCC 125 MG IJ SOLR
125.0000 mg | Freq: Once | INTRAMUSCULAR | Status: AC
  Administered 2014-08-28: 125 mg via INTRAVENOUS
  Filled 2014-08-28: qty 2

## 2014-08-28 MED ORDER — IPRATROPIUM BROMIDE 0.02 % IN SOLN
0.5000 mg | Freq: Once | RESPIRATORY_TRACT | Status: AC
  Administered 2014-08-28: 0.5 mg via RESPIRATORY_TRACT
  Filled 2014-08-28: qty 2.5

## 2014-08-28 NOTE — ED Notes (Signed)
Pt reports cough and SOB x 2 days, started to have L side cp today. Pt reports bila leg and bila arm pain as well.  Pt with trach-reports on home O2 on 5 liters.  Pt is A&Ox 4.

## 2014-08-28 NOTE — ED Notes (Signed)
Amy RT contacted to start breathing tx. 

## 2014-08-28 NOTE — ED Provider Notes (Signed)
Medical screening examination/treatment/procedure(s) were conducted as a shared visit with non-physician practitioner(s) and myself.  I personally evaluated the patient during the encounter.   EKG Interpretation   Date/Time:  Thursday August 28 2014 18:34:12 EST Ventricular Rate:  80 PR Interval:  177 QRS Duration: 67 QT Interval:  359 QTC Calculation: 414 R Axis:   73 Text Interpretation:  Sinus rhythm ST elev, probable normal early repol  pattern No significant change was found Reconfirmed by [facility]  MD, TREY  ([id number]) on 08/30/2014 9:27:3422 AM      39 year old female with history of severe asthma who recently required tracheostomy who presents with cough, shortness of breath, and chest pain.  On exam, nontoxic, alert, moderate respiratory distress, appears in pain when coughing, poor air movement bilaterally, with occasional wheeze bilaterally, heart sounds normal with tachycardic rate and regular rhythm, tenderness to palpation of left anterior chest wall. Plan to treat as asthma exacerbation. Given her work of breathing, she may require admission.  Clinical Impression: 1. Acute respiratory failure with hypoxia   2. Cough       Candyce ChurnJohn David Merrianne Mccumbers III, MD 08/30/14 [phone]

## 2014-08-28 NOTE — ED Provider Notes (Signed)
CSN: [id number]     Arrival date & time 08/28/14  1827 History   First MD Initiated Contact with Patient 08/28/14 1931     Chief Complaint  Patient presents with  . Shortness of Breath  . Chest Pain     (Consider location/radiation/quality/duration/timing/severity/associated sxs/prior Treatment) HPI Comments: Patient with a history of Asthma with recurrent intubations who currently has a trach presents today with body aches, cough, sore throat, headache, and fever.  She reports that symptoms have been present for the past 1.5 weeks and gradually worsening.  She is not taking anything for her symptoms aside from Tylenol.  She reports that she has also had some wheezing and SOB.  She is complaining of non radiating pain across her chest.  Pain has been present for the past 1.5 weeks. Pain worse with coughing.   She reports a fever of 102 at home earlier today, but states that she took Tylenol around 3 PM.  Tylenol brought the fever down, but did not help with the chest pain or body aches.  She also reports two episodes of post tussive vomiting earlier today.  She denies diarrhea, constipation, or abdominal pain.  She states that she did not get a Flu shot this year.   The history is provided by the patient.    Past Medical History  Diagnosis Date  . Asthma   . Anxiety   . COPD (chronic obstructive pulmonary disease)   . Bronchitis   . Heart murmur   . Shortness of breath    Past Surgical History  Procedure Laterality Date  . Spinal fusion    . Tracheostomy  December 2015   Family History  Problem Relation Age of Onset  . HIV Mother   . Heart disease Father   . CVA Father   . Heart disease Other   . Emphysema Maternal Grandmother     smoked  . Asthma Sister   . Clotting disorder Sister   . Clotting disorder Maternal Grandmother   . Lung cancer Maternal Grandmother     smoked   History  Substance Use Topics  . Smoking status: Former Smoker -- 2.00 packs/day for 17 years   Types: Cigarettes    Quit date: 06/15/2014  . Smokeless tobacco: Never Used  . Alcohol Use: 0.0 oz/week    0 Not specified per week     Comment: occasional   OB History    No data available     Review of Systems  All other systems reviewed and are negative.     Allergies  Robitussin dm; Nsaids; Rayon, purified; and Tramadol  Home Medications   Prior to Admission medications   Medication Sig Start Date End Date Taking? Authorizing Provider  albuterol (PROVENTIL) (2.5 MG/3ML) 0.083% nebulizer solution Take 3 mLs (2.5 mg total) by nebulization every 2 (two) hours as needed for wheezing or shortness of breath. 08/06/14  Yes Duayne CalPaul W Hoffman, NP  ALPRAZolam Prudy Feeler(XANAX) 1 MG tablet Take 1 tablet (1 mg total) by mouth 3 (three) times daily as needed for anxiety. 08/06/14  Yes Duayne CalPaul W Hoffman, NP  chlorpheniramine-HYDROcodone (TUSSIONEX) 10-8 MG/5ML LQCR Take 5 mLs by mouth every 12 (twelve) hours as needed for cough.   Yes Historical Provider, MD  diphenhydrAMINE (BENADRYL) 25 MG tablet Take 25-50 mg by mouth every 6 (six) hours as needed for itching.   Yes Historical Provider, MD  ipratropium (ATROVENT) 0.02 % nebulizer solution Take 2.5 mLs (0.5 mg total) by nebulization every 6 (six)  hours. 08/06/14  Yes Duayne Cal, NP  levalbuterol Pauline Aus) 0.63 MG/3ML nebulizer solution Take 3 mLs (0.63 mg total) by nebulization every 6 (six) hours. 08/06/14  Yes Duayne Cal, NP  mometasone-formoterol (DULERA) 100-5 MCG/ACT AERO Take 2 puffs first thing in am and then another 2 puffs about 12 hours later. 08/20/14  Yes Nyoka Cowden, MD  morphine (MSIR) 30 MG tablet Take 30 mg by mouth every 6 (six) hours as needed for moderate pain or severe pain.   Yes Historical Provider, MD  nicotine (NICODERM CQ - DOSED IN MG/24 HOURS) 21 mg/24hr patch Place 1 patch (21 mg total) onto the skin daily. 06/01/14  Yes Kathlen Mody, MD  oxyCODONE (ROXICODONE) 15 MG immediate release tablet Take 1 tablet (15 mg total)  by mouth every 6 (six) hours as needed. Patient taking differently: Take 15 mg by mouth every 6 (six) hours as needed.  08/06/14  Yes Duayne Cal, NP  Water For Irrigation, Sterile (STERILE WATER FOR IRRIGATION) Irrigate with 1,000 mLs as directed once. Patient taking differently: Irrigate with 1,000 mLs as directed daily.  08/06/14  Yes Duayne Cal, NP  zolpidem (AMBIEN) 5 MG tablet Take 1 tablet (5 mg total) by mouth at bedtime. 08/06/14  Yes Duayne Cal, NP   BP 113/74 mmHg  Pulse 75  Temp(Src) 98.2 F (36.8 C) (Oral)  Resp 16  SpO2 98%  LMP 08/28/2014 Physical Exam  Constitutional: She appears well-developed and well-nourished.  Patient tearful on exam  HENT:  Head: Normocephalic and atraumatic.  Mouth/Throat: Uvula is midline and oropharynx is clear and moist. No trismus in the jaw. No oropharyngeal exudate, posterior oropharyngeal edema or posterior oropharyngeal erythema.  Eyes: EOM are normal. Pupils are equal, round, and reactive to light.  Neck: Normal range of motion. Neck supple.  [name] appears to be in place  Cardiovascular: Normal rate, regular rhythm and normal heart sounds.   Pulmonary/Chest: Effort normal. No respiratory distress. She has decreased breath sounds. She has wheezes. She has no rales. She exhibits tenderness.  Abdominal: Soft. Bowel sounds are normal. She exhibits no distension and no mass. There is no tenderness. There is no rebound and no guarding.  Musculoskeletal: Normal range of motion.  No LE edema bilaterally  Neurological: She is alert.  Skin: Skin is warm and dry.  Psychiatric: She has a normal mood and affect.  Nursing note and vitals reviewed.   ED Course  Procedures (including critical care time) Labs Review Labs Reviewed  CBC - Abnormal; Notable for the following:    Hemoglobin 11.4 (*)    MCH 25.1 (*)    All other components within normal limits  BASIC METABOLIC PANEL  I-STAT TROPOININ, ED    Imaging Review Dg Chest 2  View (if Patient Has Fever And/or Copd)  08/28/2014   CLINICAL DATA:  Cough and shortness of breath for 2 days.  EXAM: CHEST  2 VIEW  COMPARISON:  07/31/2014; 07/29/2014  FINDINGS: Grossly unchanged cardiac silhouette and mediastinal contours. Stable positioning of support apparatus. No pneumothorax. No focal airspace opacities. There is minimal pleural parenchymal thickening about the bilateral major fissures. No pleural effusion. No evidence of edema. No acute osseus abnormalities.  IMPRESSION: 1.  Stable positioning of support apparatus.  No pneumothorax. 2.  No acute cardiopulmonary disease.   Electronically Signed   By: Simonne Come M.D.   On: 08/28/2014 19:16     EKG Interpretation None      Date: (<PARAMETER>  error)@  Rate: 80  Rhythm: normal sinus rhythm  QRS Axis: normal  Intervals: normal  ST/T Wave abnormalities: normal  Conduction Disutrbances:none  Narrative Interpretation:   Old EKG Reviewed: unchanged   MDM   Final diagnoses:  None   Patient with a history of Asthma with previous intubations with a trach presents with sore throat, fever, nausea, two episodes of vomiting, headache, body aches, and chest pain.  She did not have a flu shot this year.  Influenza panel pending.  CXR is negative.  Patient with wheezing on exam.  She was given breathing treatment and IV Solumedrol without much improvement.  She is complaining of chest pain.  Pain is atypical.  Tender to palpation and worse with coughing.  Pain most consistent with musculoskeletal pain..  No ischemic changes on EKG.  Troponin negative.  Patient admitted to [facility] for further management.      Santiago Glad, PA-C 08/30/14 2124  Candyce Churn III, MD 09/01/14 [phone]

## 2014-08-29 DIAGNOSIS — G894 Chronic pain syndrome: Secondary | ICD-10-CM

## 2014-08-29 DIAGNOSIS — Z823 Family history of stroke: Secondary | ICD-10-CM | POA: Diagnosis not present

## 2014-08-29 DIAGNOSIS — Z981 Arthrodesis status: Secondary | ICD-10-CM | POA: Diagnosis not present

## 2014-08-29 DIAGNOSIS — J9621 Acute and chronic respiratory failure with hypoxia: Secondary | ICD-10-CM

## 2014-08-29 DIAGNOSIS — J441 Chronic obstructive pulmonary disease with (acute) exacerbation: Secondary | ICD-10-CM | POA: Diagnosis present

## 2014-08-29 DIAGNOSIS — Z8249 Family history of ischemic heart disease and other diseases of the circulatory system: Secondary | ICD-10-CM | POA: Diagnosis not present

## 2014-08-29 DIAGNOSIS — Z87891 Personal history of nicotine dependence: Secondary | ICD-10-CM | POA: Diagnosis not present

## 2014-08-29 DIAGNOSIS — J9620 Acute and chronic respiratory failure, unspecified whether with hypoxia or hypercapnia: Secondary | ICD-10-CM

## 2014-08-29 DIAGNOSIS — G4700 Insomnia, unspecified: Secondary | ICD-10-CM | POA: Diagnosis present

## 2014-08-29 DIAGNOSIS — G609 Hereditary and idiopathic neuropathy, unspecified: Secondary | ICD-10-CM | POA: Diagnosis present

## 2014-08-29 DIAGNOSIS — J9600 Acute respiratory failure, unspecified whether with hypoxia or hypercapnia: Secondary | ICD-10-CM | POA: Diagnosis present

## 2014-08-29 DIAGNOSIS — J45901 Unspecified asthma with (acute) exacerbation: Secondary | ICD-10-CM | POA: Diagnosis present

## 2014-08-29 DIAGNOSIS — F4322 Adjustment disorder with anxiety: Secondary | ICD-10-CM

## 2014-08-29 DIAGNOSIS — K219 Gastro-esophageal reflux disease without esophagitis: Secondary | ICD-10-CM | POA: Diagnosis present

## 2014-08-29 DIAGNOSIS — Z801 Family history of malignant neoplasm of trachea, bronchus and lung: Secondary | ICD-10-CM | POA: Diagnosis not present

## 2014-08-29 DIAGNOSIS — F411 Generalized anxiety disorder: Secondary | ICD-10-CM | POA: Diagnosis present

## 2014-08-29 DIAGNOSIS — Z930 Tracheostomy status: Secondary | ICD-10-CM | POA: Diagnosis not present

## 2014-08-29 DIAGNOSIS — R0602 Shortness of breath: Secondary | ICD-10-CM | POA: Diagnosis present

## 2014-08-29 DIAGNOSIS — Z825 Family history of asthma and other chronic lower respiratory diseases: Secondary | ICD-10-CM | POA: Diagnosis not present

## 2014-08-29 DIAGNOSIS — F410 Panic disorder [episodic paroxysmal anxiety] without agoraphobia: Secondary | ICD-10-CM | POA: Diagnosis present

## 2014-08-29 LAB — CBC WITH DIFFERENTIAL/PLATELET
BASOS ABS: 0 10*3/uL (ref 0.0–0.1)
Basophils Relative: 0 % (ref 0–1)
EOS ABS: 0 10*3/uL (ref 0.0–0.7)
EOS PCT: 0 % (ref 0–5)
HEMATOCRIT: 33.1 % — AB (ref 36.0–46.0)
HEMOGLOBIN: 10.5 g/dL — AB (ref 12.0–15.0)
Lymphocytes Relative: 6 % — ABNORMAL LOW (ref 12–46)
Lymphs Abs: 0.5 10*3/uL — ABNORMAL LOW (ref 0.7–4.0)
MCH: 25.2 pg — ABNORMAL LOW (ref 26.0–34.0)
MCHC: 31.7 g/dL (ref 30.0–36.0)
MCV: 79.4 fL (ref 78.0–100.0)
MONO ABS: 0 10*3/uL — AB (ref 0.1–1.0)
Monocytes Relative: 0 % — ABNORMAL LOW (ref 3–12)
Neutro Abs: 8.8 10*3/uL — ABNORMAL HIGH (ref 1.7–7.7)
Neutrophils Relative %: 94 % — ABNORMAL HIGH (ref 43–77)
PLATELETS: 275 10*3/uL (ref 150–400)
RBC: 4.17 MIL/uL (ref 3.87–5.11)
RDW: 14.7 % (ref 11.5–15.5)
WBC: 9.3 10*3/uL (ref 4.0–10.5)

## 2014-08-29 LAB — COMPREHENSIVE METABOLIC PANEL
ALBUMIN: 3.5 g/dL (ref 3.5–5.2)
ALK PHOS: 62 U/L (ref 39–117)
ALT: 10 U/L (ref 0–35)
ANION GAP: 10 (ref 5–15)
AST: 20 U/L (ref 0–37)
BILIRUBIN TOTAL: 0.2 mg/dL — AB (ref 0.3–1.2)
BUN: 8 mg/dL (ref 6–23)
CO2: 20 mmol/L (ref 19–32)
CREATININE: 0.84 mg/dL (ref 0.50–1.10)
Calcium: 9 mg/dL (ref 8.4–10.5)
Chloride: 104 mEq/L (ref 96–112)
GFR calc non Af Amer: 88 mL/min — ABNORMAL LOW (ref >90)
GLUCOSE: 233 mg/dL — AB (ref 70–99)
Potassium: 3.6 mmol/L (ref 3.5–5.1)
Sodium: 134 mmol/L — ABNORMAL LOW (ref 135–145)
Total Protein: 6.9 g/dL (ref 6.0–8.3)

## 2014-08-29 LAB — INFLUENZA PANEL BY PCR (TYPE A & B)
H1N1FLUPCR: NOT DETECTED
INFLBPCR: NEGATIVE
Influenza A By PCR: NEGATIVE

## 2014-08-29 MED ORDER — HEPARIN SODIUM (PORCINE) 5000 UNIT/ML IJ SOLN
5000.0000 [IU] | Freq: Three times a day (TID) | INTRAMUSCULAR | Status: DC
  Administered 2014-08-29 – 2014-09-01 (×11): 5000 [IU] via SUBCUTANEOUS
  Filled 2014-08-29 (×14): qty 1

## 2014-08-29 MED ORDER — ZOLPIDEM TARTRATE 5 MG PO TABS
5.0000 mg | ORAL_TABLET | Freq: Every day | ORAL | Status: DC
  Administered 2014-08-29 – 2014-08-31 (×4): 5 mg via ORAL
  Filled 2014-08-29 (×4): qty 1

## 2014-08-29 MED ORDER — PREGABALIN 25 MG PO CAPS
25.0000 mg | ORAL_CAPSULE | Freq: Two times a day (BID) | ORAL | Status: DC
  Administered 2014-08-29 – 2014-09-01 (×7): 25 mg via ORAL
  Filled 2014-08-29 (×7): qty 1

## 2014-08-29 MED ORDER — IPRATROPIUM-ALBUTEROL 0.5-2.5 (3) MG/3ML IN SOLN: 3.0000 mL | RESPIRATORY_TRACT | Status: DC | PRN

## 2014-08-29 MED ORDER — SODIUM CHLORIDE 0.9 % IV SOLN
INTRAVENOUS | Status: DC
  Administered 2014-08-29 – 2014-08-31 (×2): via INTRAVENOUS

## 2014-08-29 MED ORDER — STERILE WATER FOR IRRIGATION IR SOLN
1000.0000 mL | Freq: Once | Status: AC
  Administered 2014-08-29: 1000 mL

## 2014-08-29 MED ORDER — MORPHINE SULFATE ER 30 MG PO TBCR
30.0000 mg | EXTENDED_RELEASE_TABLET | Freq: Two times a day (BID) | ORAL | Status: DC
  Administered 2014-08-29 – 2014-09-01 (×6): 30 mg via ORAL
  Filled 2014-08-29 (×6): qty 1

## 2014-08-29 MED ORDER — METHYLPREDNISOLONE SODIUM SUCC 125 MG IJ SOLR
125.0000 mg | Freq: Three times a day (TID) | INTRAMUSCULAR | Status: DC
  Administered 2014-08-29 – 2014-08-30 (×4): 125 mg via INTRAVENOUS
  Filled 2014-08-29 (×7): qty 2

## 2014-08-29 MED ORDER — MORPHINE SULFATE 15 MG PO TABS
15.0000 mg | ORAL_TABLET | Freq: Three times a day (TID) | ORAL | Status: DC | PRN
  Administered 2014-08-29 – 2014-08-30 (×2): 15 mg via ORAL
  Filled 2014-08-29 (×3): qty 1

## 2014-08-29 MED ORDER — DIPHENHYDRAMINE HCL 25 MG PO CAPS
25.0000 mg | ORAL_CAPSULE | Freq: Once | ORAL | Status: AC
  Administered 2014-08-30: 25 mg via ORAL
  Filled 2014-08-29: qty 1

## 2014-08-29 MED ORDER — ALPRAZOLAM 1 MG PO TABS
1.0000 mg | ORAL_TABLET | Freq: Three times a day (TID) | ORAL | Status: DC | PRN
  Administered 2014-08-29 – 2014-09-01 (×12): 1 mg via ORAL
  Filled 2014-08-29: qty 2
  Filled 2014-08-29 (×12): qty 1

## 2014-08-29 MED ORDER — OSELTAMIVIR PHOSPHATE 75 MG PO CAPS
75.0000 mg | ORAL_CAPSULE | Freq: Two times a day (BID) | ORAL | Status: DC
  Administered 2014-08-29 (×2): 75 mg via ORAL
  Filled 2014-08-29 (×3): qty 1

## 2014-08-29 MED ORDER — IPRATROPIUM BROMIDE 0.02 % IN SOLN
0.5000 mg | Freq: Once | RESPIRATORY_TRACT | Status: AC
  Administered 2014-08-29: 0.5 mg via RESPIRATORY_TRACT
  Filled 2014-08-29: qty 2.5

## 2014-08-29 MED ORDER — IPRATROPIUM-ALBUTEROL 0.5-2.5 (3) MG/3ML IN SOLN
3.0000 mL | RESPIRATORY_TRACT | Status: DC
  Administered 2014-08-29 (×2): 3 mL via RESPIRATORY_TRACT
  Filled 2014-08-29 (×2): qty 3

## 2014-08-29 MED ORDER — IPRATROPIUM-ALBUTEROL 0.5-2.5 (3) MG/3ML IN SOLN
3.0000 mL | RESPIRATORY_TRACT | Status: DC
Start: 2014-08-29 — End: 2014-09-01
  Administered 2014-08-29 – 2014-09-01 (×19): 3 mL via RESPIRATORY_TRACT
  Filled 2014-08-29 (×20): qty 3

## 2014-08-29 MED ORDER — MORPHINE SULFATE 30 MG PO TABS
30.0000 mg | ORAL_TABLET | Freq: Four times a day (QID) | ORAL | Status: DC | PRN
  Administered 2014-08-29 (×2): 30 mg via ORAL
  Filled 2014-08-29 (×2): qty 1

## 2014-08-29 MED ORDER — BUDESONIDE 0.25 MG/2ML IN SUSP
0.2500 mg | Freq: Two times a day (BID) | RESPIRATORY_TRACT | Status: DC
  Administered 2014-08-29 – 2014-09-01 (×6): 0.25 mg via RESPIRATORY_TRACT
  Filled 2014-08-29 (×7): qty 2

## 2014-08-29 MED ORDER — LIP MEDEX EX OINT
TOPICAL_OINTMENT | CUTANEOUS | Status: AC
  Administered 2014-08-29: 1
  Filled 2014-08-29: qty 7

## 2014-08-29 MED ORDER — LEVOFLOXACIN IN D5W 750 MG/150ML IV SOLN
750.0000 mg | INTRAVENOUS | Status: DC
  Administered 2014-08-29 – 2014-08-31 (×3): 750 mg via INTRAVENOUS
  Filled 2014-08-29 (×3): qty 150

## 2014-08-29 MED ORDER — PANTOPRAZOLE SODIUM 40 MG PO TBEC
40.0000 mg | DELAYED_RELEASE_TABLET | Freq: Every day | ORAL | Status: DC
  Administered 2014-08-29 – 2014-08-31 (×3): 40 mg via ORAL
  Filled 2014-08-29 (×4): qty 1

## 2014-08-29 MED ORDER — ALBUTEROL SULFATE (2.5 MG/3ML) 0.083% IN NEBU
5.0000 mg | INHALATION_SOLUTION | Freq: Once | RESPIRATORY_TRACT | Status: AC
  Administered 2014-08-29: 5 mg via RESPIRATORY_TRACT
  Filled 2014-08-29: qty 6

## 2014-08-29 NOTE — H&P (Addendum)
Hospitalist Admission History and Physical  Patient name: Julie Kerr Medical record number: [id number] Date of birth: [birth year] Age: 39 y.o. Gender: female  Primary Care Provider: Lu Duffel, MD  Chief Complaint: acute resp failure   History of Present Illness:This is a 39 y.o. year old female with significant past medical history of asthma w/ recurrent intubation, COPD, vocal cord dysfunction s/p trach 07/2014, chronic pain, prior tobacco abuse quit 06/2014, polysubstance abuse  presenting with acute resp failure. Pt reports generalized malaise, increased WOB, cough, wheezing over past 4-5 days. + fevers at home. Had 3 episodes of post tussive emesis today.  Julie Kerr has been stable per pt. Without obstruction or excess secretion. No flu shot this year.  Presented to [facility] ER T 99.1, HR 60s-70s, resp 10s-20s, BP 100s-120s, Satting 100% on RA. CXR negative for acute cardiopulmonary disease. Given breathing tx x 3 and solumedrol w/ minimal improvement in sxs. + diffuse pain.   Assessment and Plan: Julie Kerr is a 39 y.o. year old female presenting with acute resp failure   Active Problems:   Acute respiratory failure   1- Acute resp failure  -no acute infiltrate on imaging  -minimal to mild increased WOB  -IV solumedrol  -scheduled nebs -IV levaquin  -tamiflu  -flu panel pending  -blood cultures  -follow closely    2- Chronic pain -IV solumedrol should help w/ pleuritic component of pain   -cont home regimen  -follow -UDS   FEN/GI: NPO for now. Restart diet once resp status improves  Prophylaxis: sub q heparin  Disposition: pending further evaluation  Code Status:Full Code    Patient Active Problem List   Diagnosis Date Noted  . Acute respiratory failure 08/29/2014  . Chronic asthmatic bronchitis 08/21/2014  . Tracheostomy status 08/05/2014  . Cough   . Polysubstance abuse 07/28/2014  . Metabolic acidosis 07/28/2014  . Encounter for orogastric (OG) tube  placement   . Hyperglycemia 05/07/2014  . Shortness of breath 05/07/2014  . Generalized anxiety disorder 01/20/2014  . Panic attacks 01/20/2014  . Upper airway cough syndrome, severe, with clinical VCD 12/07/2013  . Stridor 10/29/2013  . SOB (shortness of breath) 10/29/2013  . Dyspnea 09/06/2013  . Vocal cord dysfunction 09/06/2013  . Sepsis 08/04/2013  . Anemia 08/04/2013  . Tobacco use disorder 06/17/2013  . Chronic pain syndrome 06/17/2013  . Acute respiratory failure with hypoxia 06/14/2013   Past Medical History: Past Medical History  Diagnosis Date  . Asthma   . Anxiety   . COPD (chronic obstructive pulmonary disease)   . Bronchitis   . Heart murmur   . Shortness of breath     Past Surgical History: Past Surgical History  Procedure Laterality Date  . Spinal fusion    . Tracheostomy  December 2015    Social History: History   Social History  . Marital Status: Single    Spouse Name: N/A    Number of Children: N/A  . Years of Education: N/A   Social History Main Topics  . Smoking status: Former Smoker -- 2.00 packs/day for 17 years    Types: Cigarettes    Quit date: 06/15/2014  . Smokeless tobacco: Never Used  . Alcohol Use: 0.0 oz/week    0 Not specified per week     Comment: occasional  . Drug Use: No  . Sexual Activity: Yes    Birth Control/ Protection: Other-see comments     Comment: female partners only   Other Topics Concern  . None  Social History Narrative    Family History: Family History  Problem Relation Age of Onset  . HIV Mother   . Heart disease Father   . CVA Father   . Heart disease Other   . Emphysema Maternal Grandmother     smoked  . Asthma Sister   . Clotting disorder Sister   . Clotting disorder Maternal Grandmother   . Lung cancer Maternal Grandmother     smoked    Allergies: Allergies  Allergen Reactions  . Robitussin Dm [Dextromethorphan-Guaifenesin] Nausea And Vomiting  . Nsaids Hives  . Rayon, Purified  Hives  . Tramadol Hives    Current Facility-Administered Medications  Medication Dose Route Frequency Provider Last Rate Last Dose  . 0.9 %  sodium chloride infusion   Intravenous Continuous Doree AlbeeSteven Willye Javier, MD      . ALPRAZolam Prudy Feeler(XANAX) tablet 1 mg  1 mg Oral TID PRN Doree AlbeeSteven Tramaine Snell, MD      . heparin injection 5,000 Units  5,000 Units Subcutaneous 3 times per day Doree AlbeeSteven Jashiya Bassett, MD      . hydrogen peroxide 3 % external solution           . ipratropium-albuterol (DUONEB) 0.5-2.5 (3) MG/3ML nebulizer solution 3 mL  3 mL Nebulization Q2H Doree AlbeeSteven Virlee Stroschein, MD      . ipratropium-albuterol (DUONEB) 0.5-2.5 (3) MG/3ML nebulizer solution 3 mL  3 mL Nebulization Q1H PRN Doree AlbeeSteven Faye Sanfilippo, MD      . levofloxacin (LEVAQUIN) IVPB 750 mg  750 mg Intravenous Q24H Doree AlbeeSteven Jaleiah Asay, MD      . methylPREDNISolone sodium succinate (SOLU-MEDROL) 125 mg/2 mL injection 125 mg  125 mg Intravenous 3 times per day Doree AlbeeSteven Egan Sahlin, MD      . morphine (MSIR) tablet 30 mg  30 mg Oral Q6H PRN Doree AlbeeSteven Tirth Cothron, MD      . oseltamivir (TAMIFLU) capsule 75 mg  75 mg Oral BID Doree AlbeeSteven Orlo Brickle, MD      . sterile water for irrigation for irrigation 1,000 mL  1,000 mL Irrigation Once Doree AlbeeSteven Joyleen Haselton, MD      . zolpidem Remus Loffler(AMBIEN) tablet 5 mg  5 mg Oral QHS Doree AlbeeSteven Arriyah Madej, MD       Current Outpatient Prescriptions  Medication Sig Dispense Refill  . albuterol (PROVENTIL) (2.5 MG/3ML) 0.083% nebulizer solution Take 3 mLs (2.5 mg total) by nebulization every 2 (two) hours as needed for wheezing or shortness of breath. 75 mL 12  . ALPRAZolam (XANAX) 1 MG tablet Take 1 tablet (1 mg total) by mouth 3 (three) times daily as needed for anxiety. 30 tablet 0  . chlorpheniramine-HYDROcodone (TUSSIONEX) 10-8 MG/5ML LQCR Take 5 mLs by mouth every 12 (twelve) hours as needed for cough.    . diphenhydrAMINE (BENADRYL) 25 MG tablet Take 25-50 mg by mouth every 6 (six) hours as needed for itching.    Marland Kitchen. ipratropium (ATROVENT) 0.02 % nebulizer solution Take 2.5 mLs (0.5 mg  total) by nebulization every 6 (six) hours. 75 mL 12  . levalbuterol (XOPENEX) 0.63 MG/3ML nebulizer solution Take 3 mLs (0.63 mg total) by nebulization every 6 (six) hours. 3 mL 12  . mometasone-formoterol (DULERA) 100-5 MCG/ACT AERO Take 2 puffs first thing in am and then another 2 puffs about 12 hours later.    . morphine (MSIR) 30 MG tablet Take 30 mg by mouth every 6 (six) hours as needed for moderate pain or severe pain.    . nicotine (NICODERM CQ - DOSED IN MG/24 HOURS) 21 mg/24hr patch Place 1 patch (21  mg total) onto the skin daily. 28 patch 0  . oxyCODONE (ROXICODONE) 15 MG immediate release tablet Take 1 tablet (15 mg total) by mouth every 6 (six) hours as needed. (Patient taking differently: Take 15 mg by mouth every 6 (six) hours as needed. ) 30 tablet 0  . Water For Irrigation, Sterile (STERILE WATER FOR IRRIGATION) Irrigate with 1,000 mLs as directed once. (Patient taking differently: Irrigate with 1,000 mLs as directed daily. ) 1000 mL 2  . zolpidem (AMBIEN) 5 MG tablet Take 1 tablet (5 mg total) by mouth at bedtime. 30 tablet 0   Review Of Systems: 12 point ROS negative except as noted above in HPI.  Physical Exam: Filed Vitals:   08/29/14 0023  BP: 124/60  Pulse: 78  Temp:   Resp: 18    General: alert and cooperative HEENT: PERRLA, extra ocular movement intact and trach stable, CDI Heart: S1, S2 normal, no murmur, rub or gallop, regular rate and rhythm Lungs: expiratory wheezes and rhonchi throughout both lung fields and minimal to mild increased WOB  Abdomen: abdomen is soft without significant tenderness, masses, organomegaly or guarding Extremities: extremities normal, atraumatic, no cyanosis or edema Skin:no rashes Neurology: normal without focal findings  Labs and Imaging: Lab Results  Component Value Date/Time   NA 135 08/28/2014 06:52 PM   K 3.5 08/28/2014 06:52 PM   CL 108 08/28/2014 06:52 PM   CO2 23 08/28/2014 06:52 PM   BUN 14 08/28/2014 06:52 PM    CREATININE 0.86 08/28/2014 06:52 PM   GLUCOSE 97 08/28/2014 06:52 PM   Lab Results  Component Value Date   WBC 8.2 08/28/2014   HGB 11.4* 08/28/2014   HCT 36.0 08/28/2014   MCV 79.3 08/28/2014   PLT 380 08/28/2014    Dg Chest 2 View (if Patient Has Fever And/or Copd)  08/28/2014   CLINICAL DATA:  Cough and shortness of breath for 2 days.  EXAM: CHEST  2 VIEW  COMPARISON:  07/31/2014; 07/29/2014  FINDINGS: Grossly unchanged cardiac silhouette and mediastinal contours. Stable positioning of support apparatus. No pneumothorax. No focal airspace opacities. There is minimal pleural parenchymal thickening about the bilateral major fissures. No pleural effusion. No evidence of edema. No acute osseus abnormalities.  IMPRESSION: 1.  Stable positioning of support apparatus.  No pneumothorax. 2.  No acute cardiopulmonary disease.   Electronically Signed   By: Simonne Come M.D.   On: 08/28/2014 19:16           Doree Albee MD  Pager: [phone]

## 2014-08-29 NOTE — Progress Notes (Signed)
UR completed 

## 2014-08-29 NOTE — ED Notes (Signed)
Amy RT contacted to start breathing tx.

## 2014-08-29 NOTE — Progress Notes (Signed)
Clinical Social Work  CM asked CSW to evaluate patient and provide outpatient resources. Patient currently meeting with visitors and asked for later evaluation.  Twin GroveHolly Kalee Broxton, KentuckyLCSW [phone]

## 2014-08-29 NOTE — Progress Notes (Signed)
CARE MANAGEMENT NOTE 08/29/2014  Patient:  Julie Kerr,Julie Kerr   Account Number:  [id number]  Date Initiated:  08/29/2014  Documentation initiated by:  Ferdinand CavaSCHETTINO,Fransheska Willingham  Subjective/Objective Assessment:   39 yo female admitted with acute respiratory failure from home     Action/Plan:   discharge planning   Anticipated DC Date:  08/30/2014   Anticipated DC Plan:  HOME W HOME HEALTH SERVICES      DC Planning Services  CM consult      Choice offered to / List presented to:        DME agency  [facility]        [facility]H agency  [facility]   Status of service:  In process, will continue to follow Medicare Important Message given?   (If response is "NO", the following Medicare IM given date fields will be blank) Date Medicare IM given:   Medicare IM given by:   Date Additional Medicare IM given:   Additional Medicare IM given by:    Discharge Disposition:    Per UR Regulation:    If discussed at Long Length of Stay Meetings, dates discussed:    Comments:  08/29/14 Ferdinand CavaAndrea Schettino RN BSN CM [phone] Patient lives at home with children and Melanee Spryfiancee, Laurissa Tillman [phone]. She states [facility] prior to hospitalization and would like HH RN and [facility] when dc'd. She is also requesting DME: shower chair and BSC. She states that she is scheduled to have trach out on January 20th and would appreciate HH support unril that time. Will continue to follow, awaiting orders.

## 2014-08-29 NOTE — Progress Notes (Signed)
Patient seen and examined. Admitted after midnight secondary to worsening SOB and wheezing. Found to have exacerbation of her asthma and most likely bronchitis. Patient ran out of her anxiolytics and chronic pain meds and arrived to ED with worsening of her conditions (pain and anxiety). Patient also complaining of neuropathy. Please refer to H&P done by Dr. Alvester MorinNewton for further info/details on admisison.  Plan: -will resume pain meds and xanax as she use at home -continue solumedrol, add pulmicort and continue duonebs -continue oxygen supplementation -continue levaquin -will start low dose lyrica for neuropathy PPI for GI protection   Vassie LollMadera, Zenola Dezarn [phone]

## 2014-08-30 LAB — CBC WITH DIFFERENTIAL/PLATELET
Basophils Absolute: 0 10*3/uL (ref 0.0–0.1)
Basophils Relative: 0 % (ref 0–1)
Eosinophils Absolute: 0 10*3/uL (ref 0.0–0.7)
Eosinophils Relative: 0 % (ref 0–5)
HEMATOCRIT: 33.2 % — AB (ref 36.0–46.0)
HEMOGLOBIN: 10.3 g/dL — AB (ref 12.0–15.0)
Lymphocytes Relative: 4 % — ABNORMAL LOW (ref 12–46)
Lymphs Abs: 0.9 10*3/uL (ref 0.7–4.0)
MCH: 25.1 pg — ABNORMAL LOW (ref 26.0–34.0)
MCHC: 31 g/dL (ref 30.0–36.0)
MCV: 81 fL (ref 78.0–100.0)
Monocytes Absolute: 0.7 10*3/uL (ref 0.1–1.0)
Monocytes Relative: 3 % (ref 3–12)
NEUTROS ABS: 22.3 10*3/uL — AB (ref 1.7–7.7)
Neutrophils Relative %: 93 % — ABNORMAL HIGH (ref 43–77)
PLATELETS: 330 10*3/uL (ref 150–400)
RBC: 4.1 MIL/uL (ref 3.87–5.11)
RDW: 15.1 % (ref 11.5–15.5)
WBC: 23.9 10*3/uL — ABNORMAL HIGH (ref 4.0–10.5)

## 2014-08-30 LAB — COMPREHENSIVE METABOLIC PANEL
ALT: 13 U/L (ref 0–35)
AST: 23 U/L (ref 0–37)
Albumin: 3.3 g/dL — ABNORMAL LOW (ref 3.5–5.2)
Alkaline Phosphatase: 64 U/L (ref 39–117)
Anion gap: 9 (ref 5–15)
BILIRUBIN TOTAL: 0.3 mg/dL (ref 0.3–1.2)
BUN: 6 mg/dL (ref 6–23)
CALCIUM: 9.6 mg/dL (ref 8.4–10.5)
CO2: 21 mmol/L (ref 19–32)
Chloride: 109 mEq/L (ref 96–112)
Creatinine, Ser: 0.82 mg/dL (ref 0.50–1.10)
GFR calc Af Amer: >90 mL/min (ref >90)
GFR calc non Af Amer: >90 mL/min (ref >90)
Glucose, Bld: 246 mg/dL — ABNORMAL HIGH (ref 70–99)
POTASSIUM: 4.1 mmol/L (ref 3.5–5.1)
Sodium: 139 mmol/L (ref 135–145)
TOTAL PROTEIN: 6.9 g/dL (ref 6.0–8.3)

## 2014-08-30 MED ORDER — METHYLPREDNISOLONE SODIUM SUCC 125 MG IJ SOLR
60.0000 mg | Freq: Three times a day (TID) | INTRAMUSCULAR | Status: DC
  Administered 2014-08-30 – 2014-08-31 (×4): 60 mg via INTRAVENOUS
  Filled 2014-08-30 (×6): qty 0.96

## 2014-08-30 MED ORDER — OXYCODONE HCL 5 MG PO TABS
15.0000 mg | ORAL_TABLET | Freq: Four times a day (QID) | ORAL | Status: DC | PRN
  Administered 2014-08-30 – 2014-09-01 (×7): 15 mg via ORAL
  Filled 2014-08-30 (×7): qty 3

## 2014-08-30 MED ORDER — DIPHENHYDRAMINE HCL 25 MG PO CAPS
25.0000 mg | ORAL_CAPSULE | Freq: Once | ORAL | Status: AC
  Administered 2014-08-30: 25 mg via ORAL
  Filled 2014-08-30: qty 1
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
## 2014-08-30 NOTE — Progress Notes (Signed)
[facility] PROGRESS NOTE  Julie Kerr ZOX:[id number] DOB: [birth year] DOA: 08/28/2014 PCP: Lu Duffel, MD  Assessment/Plan: 1-acute on chronic resp failure: due to exacerbation of chronic bronchitis and asthma -continue levaquin -continue pulmicort and nebulizer treatments -will start tapering steroids -influenza panel is neg -follow blood cx's -patient is afebrile  2-anxiety: continue use of xanax  3-chronic pain and neuropathy: continue MS contin BID and Oxycodone for breakthrough -will start lyrica and follow response  4-GI protection: continue PPI  DVT: heparin  Code Status: Full Family Communication: no family at bedside Disposition Plan: to be determine    Consultants:  None   Procedures:  See below for x-ray reports  Antibiotics:  levaquin   HPI/Subjective: Patient is feeling better and breathing easier. No fever. Still with chronic pain not adequately controlled  Objective: Filed Vitals:   08/30/14 2143  BP: 122/61  Pulse: 76  Temp: 98.6 F (37 C)  Resp: 18    Intake/Output Summary (Last 24 hours) at 08/30/14 2200 Last data filed at 08/30/14 1902  Gross per 24 hour  Intake   3930 ml  Output      0 ml  Net   3930 ml   Filed Weights   08/29/14 0315  Weight: 84 kg (185 lb 3 oz)    Exam:  General:  Feeling better; still with mild anxiety and chronic pain not adequately controlled. Breathing is much better and has remained afebrile   Cardiovascular: S1 and S2, no rubs or gallops  Respiratory: exp wheezing and positive rhonchi in patient with trach and very coarse BS  Abdomen: soft, NT, ND, positive BS  Musculoskeletal: no edema, no cyanosis   Data Reviewed: Basic Metabolic Panel:  Recent Labs Lab 08/28/14 1852 08/29/14 0709 08/30/14 0440  NA 135 134* 139  K 3.5 3.6 4.1  CL 108 104 109  CO2 GLUCOSE 97 233* 246*  BUN CREATININE 0.86 0.84 0.82  CALCIUM 9.2 9.0 9.6   Liver Function  Tests:  Recent Labs Lab 08/29/14 0709 08/30/14 0440  AST 20 23  ALT 10 13  ALKPHOS 62 64  BILITOT 0.2* 0.3  PROT 6.9 6.9  ALBUMIN 3.5 3.3*   No results for input(s): LIPASE, AMYLASE in the last 168 hours. No results for input(s): AMMONIA in the last 168 hours. CBC:  Recent Labs Lab 08/28/14 1852 08/29/14 0709 08/30/14 0440  WBC 8.2 9.3 23.9*  NEUTROABS  --  8.8* 22.3*  HGB 11.4* 10.5* 10.3*  HCT 36.0 33.1* 33.2*  MCV 79.3 79.4 81.0  PLT 380 275 330   Cardiac Enzymes: No results for input(s): CKTOTAL, CKMB, CKMBINDEX, TROPONINI in the last 168 hours. BNP (last 3 results)  Recent Labs  05/06/14 2328 07/25/14 1718 07/25/14 1844  PROBNP <5.0 <5.0 <5.0   CBG: No results for input(s): GLUCAP in the last 168 hours.  No results found for this or any previous visit (from the past 240 hour(s)).   Studies: No results found.  Scheduled Meds: . budesonide (PULMICORT) nebulizer solution  0.25 mg Nebulization BID  . heparin  5,000 Units Subcutaneous 3 times per day  . ipratropium-albuterol  3 mL Nebulization Q4H  . levofloxacin (LEVAQUIN) IV  750 mg Intravenous Q24H  . methylPREDNISolone (SOLU-MEDROL) injection  60 mg Intravenous 3 times per day  . morphine  30 mg Oral Q12H  . pantoprazole  40 mg Oral Q1200  . pregabalin  25 mg Oral BID  . zolpidem  5 mg  Oral QHS   Continuous Infusions: . sodium chloride 50 mL/hr at 08/30/14 1045    Active Problems:   Acute respiratory failure   Adjustment disorder with anxious mood   Hereditary and idiopathic peripheral neuropathy   Acute on chronic respiratory failure with hypoxia    Time spent: 30 minutes    Vassie LollMadera, Julie Kerr  [facility] Pager [phone]. If 7PM-7AM, please contact night-coverage at [email or web address], password [email or web address] 08/30/2014, 10:00 PM  LOS: 2 days

## 2014-08-31 DIAGNOSIS — J441 Chronic obstructive pulmonary disease with (acute) exacerbation: Principal | ICD-10-CM

## 2014-08-31 LAB — COMPREHENSIVE METABOLIC PANEL WITH GFR
ALT: 54 U/L — ABNORMAL HIGH (ref 0–35)
AST: 47 U/L — ABNORMAL HIGH (ref 0–37)
Albumin: 3.4 g/dL — ABNORMAL LOW (ref 3.5–5.2)
Alkaline Phosphatase: 65 U/L (ref 39–117)
Anion gap: 11 (ref 5–15)
BUN: 10 mg/dL (ref 6–23)
CO2: 26 mmol/L (ref 19–32)
Calcium: 9.3 mg/dL (ref 8.4–10.5)
Chloride: 102 meq/L (ref 96–112)
Creatinine, Ser: 0.85 mg/dL (ref 0.50–1.10)
GFR calc Af Amer: >90 mL/min
GFR calc non Af Amer: 87 mL/min — ABNORMAL LOW
Glucose, Bld: 222 mg/dL — ABNORMAL HIGH (ref 70–99)
Potassium: 3.8 mmol/L (ref 3.5–5.1)
Sodium: 139 mmol/L (ref 135–145)
Total Bilirubin: 0.3 mg/dL (ref 0.3–1.2)
Total Protein: 6.8 g/dL (ref 6.0–8.3)

## 2014-08-31 LAB — CBC WITH DIFFERENTIAL/PLATELET
Basophils Absolute: 0 10*3/uL (ref 0.0–0.1)
Basophils Relative: 0 % (ref 0–1)
EOS ABS: 0 10*3/uL (ref 0.0–0.7)
EOS PCT: 0 % (ref 0–5)
HCT: 33.3 % — ABNORMAL LOW (ref 36.0–46.0)
Hemoglobin: 10.2 g/dL — ABNORMAL LOW (ref 12.0–15.0)
LYMPHS PCT: 5 % — AB (ref 12–46)
Lymphs Abs: 1 10*3/uL (ref 0.7–4.0)
MCH: 25 pg — AB (ref 26.0–34.0)
MCHC: 30.6 g/dL (ref 30.0–36.0)
MCV: 81.6 fL (ref 78.0–100.0)
MONOS PCT: 4 % (ref 3–12)
Monocytes Absolute: 0.9 10*3/uL (ref 0.1–1.0)
NEUTROS PCT: 91 % — AB (ref 43–77)
Neutro Abs: 20.1 10*3/uL — ABNORMAL HIGH (ref 1.7–7.7)
PLATELETS: 357 10*3/uL (ref 150–400)
RBC: 4.08 MIL/uL (ref 3.87–5.11)
RDW: 15.2 % (ref 11.5–15.5)
WBC: 21.9 10*3/uL — AB (ref 4.0–10.5)

## 2014-08-31 MED ORDER — LEVOFLOXACIN 750 MG PO TABS
750.0000 mg | ORAL_TABLET | Freq: Every day | ORAL | Status: DC
  Administered 2014-08-31 – 2014-09-01 (×2): 750 mg via ORAL
  Filled 2014-08-31 (×2): qty 1

## 2014-08-31 MED ORDER — PREDNISONE 50 MG PO TABS
50.0000 mg | ORAL_TABLET | Freq: Every day | ORAL | Status: DC
  Administered 2014-09-01: 50 mg via ORAL
  Filled 2014-08-31 (×2): qty 1
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
## 2014-08-31 NOTE — Progress Notes (Signed)
[facility] PROGRESS NOTE  Julie Kerr OZH:[id number] DOB: 04/11/1976 DOA: 08/28/2014 PCP: Lu DuffelWELLS,WENDELL, MD  Assessment/Plan: 1-acute on chronic resp failure: due to exacerbation of chronic bronchitis and asthma -continue levaquin; but will switch to PO -continue pulmicort and nebulizer treatments -will continue tapering steroids -influenza panel is neg -follow blood cx's -patient has remaineds afebrile -home in am if remains stable/continue improving. -will arrange for HHRT  2-anxiety: continue use of xanax  3-chronic pain and neuropathy: continue MS contin BID and Oxycodone for breakthrough -will continue lyrica and follow response  4-GI protection: continue PPI  5-insomnia: will continue PRN zolpidem  DVT: heparin  Code Status: Full Family Communication: no family at bedside Disposition Plan: to be determine    Consultants:  None   Procedures:  See below for x-ray reports  Antibiotics:  levaquin   HPI/Subjective: Patient breathing continue improving, no fever. Anxiety and chronic pain much better controlled  Objective: Filed Vitals:   08/31/14 1300  BP:   Pulse: 72  Temp:   Resp: 18    Intake/Output Summary (Last 24 hours) at 08/31/14 1510 Last data filed at 08/31/14 1040  Gross per 24 hour  Intake   1520 ml  Output    800 ml  Net    720 ml   Filed Weights   08/29/14 0315  Weight: 84 kg (185 lb 3 oz)    Exam:  General:  Feeling better; anxiety and chronic pain significantly improved. Still with some tachypnea and work of breathing on exertion, but improved overall. No fever   Cardiovascular: S1 and S2, no rubs or gallops  Respiratory: mild exp wheezing and positive rhonchi in patient with trach and very coarse BS  Abdomen: soft, NT, ND, positive BS  Musculoskeletal: no edema, no cyanosis   Data Reviewed: Basic Metabolic Panel:  Recent Labs Lab 08/28/14 1852 08/29/14 0709 08/30/14 0440 08/31/14 0540  NA 135 134* 139  139  K 3.5 3.6 4.1 3.8  CL 108 104 109 102  CO2 23 20 21 26   GLUCOSE 97 233* 246* 222*  BUN 14 8 6 10   CREATININE 0.86 0.84 0.82 0.85  CALCIUM 9.2 9.0 9.6 9.3   Liver Function Tests:  Recent Labs Lab 08/29/14 0709 08/30/14 0440 08/31/14 0540  AST 20 23 47*  ALT 10 13 54*  ALKPHOS 62 64 65  BILITOT 0.2* 0.3 0.3  PROT 6.9 6.9 6.8  ALBUMIN 3.5 3.3* 3.4*   CBC:  Recent Labs Lab 08/28/14 1852 08/29/14 0709 08/30/14 0440 08/31/14 0540  WBC 8.2 9.3 23.9* 21.9*  NEUTROABS  --  8.8* 22.3* 20.1*  HGB 11.4* 10.5* 10.3* 10.2*  HCT 36.0 33.1* 33.2* 33.3*  MCV 79.3 79.4 81.0 81.6  PLT 380 275 330 357   BNP (last 3 results)  Recent Labs  05/06/14 2328 07/25/14 1718 07/25/14 1844  PROBNP <5.0 <5.0 <5.0   Studies: No results found.  Scheduled Meds: . budesonide (PULMICORT) nebulizer solution  0.25 mg Nebulization BID  . heparin  5,000 Units Subcutaneous 3 times per day  . ipratropium-albuterol  3 mL Nebulization Q4H  . levofloxacin  750 mg Oral Daily  . morphine  30 mg Oral Q12H  . pantoprazole  40 mg Oral Q1200  . [START ON 09/01/2014] predniSONE  50 mg Oral Q breakfast  . pregabalin  25 mg Oral BID  . zolpidem  5 mg Oral QHS   Continuous Infusions:    Active Problems:   Acute respiratory failure   Adjustment disorder with  anxious mood   Hereditary and idiopathic peripheral neuropathy   Acute on chronic respiratory failure with hypoxia    Time spent: 30 minutes    Vassie Loll  [facility] Pager [phone]. If 7PM-7AM, please contact night-coverage at [email or web address], password [facility] 08/31/2014, 3:10 PM  LOS: 3 days
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
## 2014-09-01 LAB — COMPREHENSIVE METABOLIC PANEL
ALBUMIN: 3.2 g/dL — AB (ref 3.5–5.2)
ALK PHOS: 60 U/L (ref 39–117)
ALT: 108 U/L — ABNORMAL HIGH (ref 0–35)
ANION GAP: 10 (ref 5–15)
AST: 81 U/L — ABNORMAL HIGH (ref 0–37)
BILIRUBIN TOTAL: 0.3 mg/dL (ref 0.3–1.2)
BUN: 15 mg/dL (ref 6–23)
CHLORIDE: 102 meq/L (ref 96–112)
CO2: 28 mmol/L (ref 19–32)
Calcium: 9 mg/dL (ref 8.4–10.5)
Creatinine, Ser: 0.88 mg/dL (ref 0.50–1.10)
GFR, EST NON AFRICAN AMERICAN: 84 mL/min — AB (ref >90)
GLUCOSE: 138 mg/dL — AB (ref 70–99)
POTASSIUM: 3.4 mmol/L — AB (ref 3.5–5.1)
Sodium: 140 mmol/L (ref 135–145)
Total Protein: 6.1 g/dL (ref 6.0–8.3)

## 2014-09-01 LAB — CBC WITH DIFFERENTIAL/PLATELET
BASOS ABS: 0 10*3/uL (ref 0.0–0.1)
BASOS PCT: 0 % (ref 0–1)
Eosinophils Absolute: 0 10*3/uL (ref 0.0–0.7)
Eosinophils Relative: 0 % (ref 0–5)
HCT: 31.9 % — ABNORMAL LOW (ref 36.0–46.0)
Hemoglobin: 9.9 g/dL — ABNORMAL LOW (ref 12.0–15.0)
LYMPHS ABS: 2.5 10*3/uL (ref 0.7–4.0)
Lymphocytes Relative: 15 % (ref 12–46)
MCH: 25.1 pg — ABNORMAL LOW (ref 26.0–34.0)
MCHC: 31 g/dL (ref 30.0–36.0)
MCV: 80.8 fL (ref 78.0–100.0)
MONO ABS: 1 10*3/uL (ref 0.1–1.0)
Monocytes Relative: 6 % (ref 3–12)
Neutro Abs: 12.7 10*3/uL — ABNORMAL HIGH (ref 1.7–7.7)
Neutrophils Relative %: 79 % — ABNORMAL HIGH (ref 43–77)
PLATELETS: 315 10*3/uL (ref 150–400)
RBC: 3.95 MIL/uL (ref 3.87–5.11)
RDW: 15 % (ref 11.5–15.5)
WBC: 16.2 10*3/uL — ABNORMAL HIGH (ref 4.0–10.5)

## 2014-09-01 MED ORDER — HYDROCOD POLST-CHLORPHEN POLST 10-8 MG/5ML PO LQCR: 5.0000 mL | Freq: Two times a day (BID) | ORAL | Status: DC | PRN

## 2014-09-01 MED ORDER — MORPHINE SULFATE ER 30 MG PO TBCR: 30.0000 mg | EXTENDED_RELEASE_TABLET | Freq: Two times a day (BID) | ORAL | Status: DC

## 2014-09-01 MED ORDER — PANTOPRAZOLE SODIUM 40 MG PO TBEC: 40.0000 mg | DELAYED_RELEASE_TABLET | Freq: Every day | ORAL | Status: DC

## 2014-09-01 MED ORDER — OXYCODONE HCL 15 MG PO TABS: 15.0000 mg | ORAL_TABLET | Freq: Four times a day (QID) | ORAL | Status: DC | PRN

## 2014-09-01 MED ORDER — IPRATROPIUM-ALBUTEROL 0.5-2.5 (3) MG/3ML IN SOLN: 3.0000 mL | Freq: Four times a day (QID) | RESPIRATORY_TRACT | Status: DC | PRN

## 2014-09-01 MED ORDER — ALPRAZOLAM 1 MG PO TABS: 1.0000 mg | ORAL_TABLET | Freq: Three times a day (TID) | ORAL | Status: DC | PRN

## 2014-09-01 MED ORDER — LIP MEDEX EX OINT
TOPICAL_OINTMENT | CUTANEOUS | Status: AC
  Administered 2014-09-01: 12:00:00
  Filled 2014-09-01: qty 7

## 2014-09-01 MED ORDER — LEVOFLOXACIN 750 MG PO TABS: 750.0000 mg | ORAL_TABLET | Freq: Every day | ORAL | Status: DC

## 2014-09-01 MED ORDER — PREDNISONE 10 MG PO TABS: ORAL_TABLET | ORAL | Status: DC

## 2014-09-01 MED ORDER — PREGABALIN 25 MG PO CAPS: 25.0000 mg | ORAL_CAPSULE | Freq: Two times a day (BID) | ORAL | Status: DC

## 2014-09-01 MED ORDER — ZOLPIDEM TARTRATE 5 MG PO TABS: 5.0000 mg | ORAL_TABLET | Freq: Every day | ORAL | Status: DC

## 2014-09-01 MED ORDER — BUDESONIDE 0.25 MG/2ML IN SUSP: 0.2500 mg | Freq: Two times a day (BID) | RESPIRATORY_TRACT | Status: DC

## 2014-09-01 NOTE — Discharge Summary (Signed)
Physician Discharge Summary  Julie Kerr UJW:[id number] DOB: 04/28/1976 DOA: 08/28/2014  PCP: Lu DuffelWELLS,WENDELL, MD  Admit date: 08/28/2014 Discharge date: 09/01/2014  Time spent: 30 minutes  Recommendations for Outpatient Follow-up:  Repeat BMET to follow electrolytes Please arrange/assist with pain clinic follow up and prescribe meds as needed for her anxiety and chronic pain until appointment set up.  Discharge Diagnoses:  Acute on chronic respiratory failure with hypoxia Adjustment disorder with anxious mood Hereditary and idiopathic peripheral neuropathy GERD Chronic pain Insomnia    Discharge Condition: stable and improved. [facility].  Filed Weights   08/29/14 0315  Weight: 84 kg (185 lb 3 oz)    History of present illness:  39 y.o. 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 presenting with acute resp failure. Pt reports generalized malaise, increased WOB, cough, wheezing over past 4-5 days. + fevers at home. Had 3 episodes of post tussive emesis today. Janina Mayorach has been stable per pt. Without obstruction or excess secretion. No flu shot this year.   Hospital Course:  1-acute on chronic resp failure: due to exacerbation of chronic bronchitis and asthma -continue levaquin by mouth to complete abx therapy -will discharge on tapering steroids, pulmicort nebulization, as she is having trouble with inhalers and also PRN duonebs. -influenza panel is neg -blood cx's negative up to date -patient has remained afebrile and breathing is back to baseline -will arrange for HHRT  2-anxiety: continue use of xanax  3-chronic pain and neuropathy: continue MS contin BID and Oxycodone for breakthrough -will discharge on lyrica as patient reports improvement and has failed to neurontin in the  past  4-GERD and GI protection: continue PPI  5-insomnia: will continue PRN zolpidem  Procedures:  See below for x-ray reports   Consultations:  None   Discharge Exam: Filed Vitals:   09/01/14 0635  BP: 118/67  Pulse: 84  Temp: 98.2 F (36.8 C)  Resp: 16    General: Feeling a lot better; anxiety and chronic pain significantly improved. Still with some tachypnea and work of breathing on exertion, but significantly improved overall. No fever   Cardiovascular: S1 and S2, no rubs or gallops  Respiratory: no exp wheezing and just scattered rhonchi in patient with trach and very coarse BS  Abdomen: soft, NT, ND, positive BS  Musculoskeletal: no edema, no cyanosis   Discharge Instructions   Discharge Instructions    Discharge instructions    Complete by:  As directed   Take medications as prescribed Follow with PCP in 1-2 weeks Make sure you set appointment up and follow with pain clinic No smoking and also Avoid second hand smoking Keep yourself well hydrated          Current Discharge Medication List    START taking these medications   Details  budesonide (PULMICORT) 0.25 MG/2ML nebulizer solution Take 2 mLs (0.25 mg total) by nebulization 2 (two) times daily. Qty: 60 mL, Refills: 3    ipratropium-albuterol (DUONEB) 0.5-2.5 (3) MG/3ML SOLN Take 3 mLs by nebulization every 6 (six) hours as needed (SOB, wheezing). Qty: 360 mL, Refills: 2    levofloxacin (LEVAQUIN) 750 MG tablet Take 1 tablet (750 mg total) by mouth daily. Qty: 5 tablet, Refills: 0    morphine (MS CONTIN) 30 MG 12 hr tablet Take 1 tablet (30 mg total) by mouth every 12 (twelve)  hours. Qty: 40 tablet, Refills: 0    pantoprazole (PROTONIX) 40 MG tablet Take 1 tablet (40 mg total) by mouth daily at 12 noon. Qty: 30 tablet, Refills: 1    predniSONE (DELTASONE) 10 MG tablet Take 4 tablets by mouth daily X 1 day; then 3 tablets by mouth daily X 2 days; then 2 tablets by mouth daily X 2 days;  then 1 tablet by mouth daily X 3 days and stop prednisone. Qty: 20 tablet, Refills: 0    pregabalin (LYRICA) 25 MG capsule Take 1 capsule (25 mg total) by mouth 2 (two) times daily. Qty: 60 capsule, Refills: 0      CONTINUE these medications which have CHANGED   Details  ALPRAZolam (XANAX) 1 MG tablet Take 1 tablet (1 mg total) by mouth 3 (three) times daily as needed for anxiety. Qty: 45 tablet, Refills: 0    chlorpheniramine-HYDROcodone (TUSSIONEX) 10-8 MG/5ML LQCR Take 5 mLs by mouth every 12 (twelve) hours as needed for cough. Qty: 473 mL, Refills: 0    oxyCODONE (ROXICODONE) 15 MG immediate release tablet Take 1 tablet (15 mg total) by mouth every 6 (six) hours as needed for moderate pain or breakthrough pain. Qty: 45 tablet, Refills: 0    zolpidem (AMBIEN) 5 MG tablet Take 1 tablet (5 mg total) by mouth at bedtime. Qty: 20 tablet, Refills: 0      CONTINUE these medications which have NOT CHANGED   Details  diphenhydrAMINE (BENADRYL) 25 MG tablet Take 25-50 mg by mouth every 6 (six) hours as needed for itching.    nicotine (NICODERM CQ - DOSED IN MG/24 HOURS) 21 mg/24hr patch Place 1 patch (21 mg total) onto the skin daily. Qty: 28 patch, Refills: 0    Water For Irrigation, Sterile (STERILE WATER FOR IRRIGATION) Irrigate with 1,000 mLs as directed once. Qty: 1000 mL, Refills: 2      STOP taking these medications     albuterol (PROVENTIL) (2.5 MG/3ML) 0.083% nebulizer solution      ipratropium (ATROVENT) 0.02 % nebulizer solution      levalbuterol (XOPENEX) 0.63 MG/3ML nebulizer solution      mometasone-formoterol (DULERA) 100-5 MCG/ACT AERO      morphine (MSIR) 30 MG tablet        Allergies  Allergen Reactions  . Robitussin Dm [Dextromethorphan-Guaifenesin] Nausea And Vomiting  . Nsaids Hives  . Rayon, Purified Hives  . Tramadol Hives   Follow-up Information    Follow up with Lu Duffel, MD.   Specialty:  Family Medicine   Why:  1-2 weeks      The  results of significant diagnostics from this hospitalization (including imaging, microbiology, ancillary and laboratory) are listed below for reference.    Significant Diagnostic Studies: Dg Chest 2 View (if Patient Has Fever And/or Copd)  08/28/2014   CLINICAL DATA:  Cough and shortness of breath for 2 days.  EXAM: CHEST  2 VIEW  COMPARISON:  07/31/2014; 07/29/2014  FINDINGS: Grossly unchanged cardiac silhouette and mediastinal contours. Stable positioning of support apparatus. No pneumothorax. No focal airspace opacities. There is minimal pleural parenchymal thickening about the bilateral major fissures. No pleural effusion. No evidence of edema. No acute osseus abnormalities.  IMPRESSION: 1.  Stable positioning of support apparatus.  No pneumothorax. 2.  No acute cardiopulmonary disease.   Electronically Signed   By: Simonne Come M.D.   On: 08/28/2014 19:16    Labs: Basic Metabolic Panel:  Recent Labs Lab 08/28/14 1852 08/29/14 0709 08/30/14 0440 08/31/14 0540  09/01/14 0458  NA 135 134* 139 139 140  K 3.5 3.6 4.1 3.8 3.4*  CL 108 104 109 102 102  CO2 GLUCOSE 97 233* 246* 222* 138*  BUN CREATININE 0.86 0.84 0.82 0.85 0.88  CALCIUM 9.2 9.0 9.6 9.3 9.0   Liver Function Tests:  Recent Labs Lab 08/29/14 0709 08/30/14 0440 08/31/14 0540 09/01/14 0458  AST 20 23 47* 81*  ALT 10 13 54* 108*  ALKPHOS 62 64 65 60  BILITOT 0.2* 0.3 0.3 0.3  PROT 6.9 6.9 6.8 6.1  ALBUMIN 3.5 3.3* 3.4* 3.2*   CBC:  Recent Labs Lab 08/28/14 1852 08/29/14 0709 08/30/14 0440 08/31/14 0540 09/01/14 0458  WBC 8.2 9.3 23.9* 21.9* 16.2*  NEUTROABS  --  8.8* 22.3* 20.1* 12.7*  HGB 11.4* 10.5* 10.3* 10.2* 9.9*  HCT 36.0 33.1* 33.2* 33.3* 31.9*  MCV 79.3 79.4 81.0 81.6 80.8  PLT 380 275 330 357 315   BNP: BNP (last 3 results)  Recent Labs  05/06/14 2328 07/25/14 1718 07/25/14 1844  PROBNP <5.0 <5.0 <5.0    Signed:  Vassie Loll  [facility] 09/01/2014, 12:38 PM

## 2014-09-01 NOTE — Progress Notes (Signed)
[facility]   [facility] the [facility]: Received orders for 4 wheel walker and bedside commode.  The patient received a standard rw, commode, and tub bench 06/17/2013, therefore she is not eligible at this time for the walker and commode.  I have notified the CM.  If patient discharges after hours, please call [phone].   Renard HamperLecretia Williamson 09/01/2014, 1:09 PM

## 2014-09-01 NOTE — Progress Notes (Signed)
CARE MANAGEMENT NOTE 09/01/2014  Patient:  Julie Kerr,Julie Kerr   Account Number:  [id number]  Date Initiated:  08/29/2014  Documentation initiated by:  Ferdinand CavaSCHETTINO,German Manke  Subjective/Objective Assessment:   39 yo female admitted with acute respiratory failure from home     Action/Plan:   discharge planning   Anticipated DC Date:  08/30/2014   Anticipated DC Plan:  HOME W HOME HEALTH SERVICES      DC Planning Services  CM consult      Choice offered to / List presented to:     DME arranged  WALKER - ROLLING  BEDSIDE COMMODE      DME agency  [facility]     [facility]  HH-1 RN  HH-2 PT  HH-7 RESPIRATORY THERAPY      HH agency  [facility]   Status of service:  Completed, signed off Medicare Important Message given?   (If response is "NO", the following Medicare IM given date fields will be blank) Date Medicare IM given:   Medicare IM given by:   Date Additional Medicare IM given:   Additional Medicare IM given by:    Discharge Disposition:    Per UR Regulation:    If discussed at Long Length of Stay Meetings, dates discussed:    Comments:  09/01/14 Ferdinand CavaAndrea Schettino RN BSN CM [phone] Communicated HH [facility], Judeth CornfieldStephanie, also spoke with Mayra ReelLecretia, [facility], to communicate that patient prefers to have equipment delivered to the house. Patient aware and agreeable.  08/29/14 Ferdinand CavaAndrea Schettino RN BSN CM [phone] Patient lives at home with children and Melanee Spryfiancee, Laurissa Tillman [phone]. She [facility] and [facility] and [facility]. She is also requesting DME: shower chair and BSC. [facility] her [facility]. She laso has PCS throguh Medicaid and they come for 1 hour/day 7 days/week. She states that she is scheduled to have trach out on January 20th and would appreciate HH support unril that time. Will continue to follow,  awaiting orders.

## 2014-09-01 NOTE — Progress Notes (Signed)
Julie Kerr to be D/C'd Home per MD order.  Discussed prescriptions and follow up appointments with the patient. Prescriptions given to patient, medication list explained in detail. Pt verbalized understanding.    Medication List    STOP taking these medications        albuterol (2.5 MG/3ML) 0.083% nebulizer solution  Commonly known as:  PROVENTIL     ipratropium 0.02 % nebulizer solution  Commonly known as:  ATROVENT     levalbuterol 0.63 MG/3ML nebulizer solution  Commonly known as:  XOPENEX     mometasone-formoterol 100-5 MCG/ACT Aero  Commonly known as:  DULERA     morphine 30 MG tablet  Commonly known as:  MSIR  Replaced by:  morphine 30 MG 12 hr tablet      TAKE these medications        ALPRAZolam 1 MG tablet  Commonly known as:  XANAX  Take 1 tablet (1 mg total) by mouth 3 (three) times daily as needed for anxiety.     budesonide 0.25 MG/2ML nebulizer solution  Commonly known as:  PULMICORT  Take 2 mLs (0.25 mg total) by nebulization 2 (two) times daily.     chlorpheniramine-HYDROcodone 10-8 MG/5ML Lqcr  Commonly known as:  TUSSIONEX  Take 5 mLs by mouth every 12 (twelve) hours as needed for cough.     diphenhydrAMINE 25 MG tablet  Commonly known as:  BENADRYL  Take 25-50 mg by mouth every 6 (six) hours as needed for itching.     ipratropium-albuterol 0.5-2.5 (3) MG/3ML Soln  Commonly known as:  DUONEB  Take 3 mLs by nebulization every 6 (six) hours as needed (SOB, wheezing).     levofloxacin 750 MG tablet  Commonly known as:  LEVAQUIN  Take 1 tablet (750 mg total) by mouth daily.     morphine 30 MG 12 hr tablet  Commonly known as:  MS CONTIN  Take 1 tablet (30 mg total) by mouth every 12 (twelve) hours.     nicotine 21 mg/24hr patch  Commonly known as:  NICODERM CQ - dosed in mg/24 hours  Place 1 patch (21 mg total) onto the skin daily.     oxyCODONE 15 MG immediate release tablet  Commonly known as:  ROXICODONE  Take 1 tablet (15 mg total) by  mouth every 6 (six) hours as needed for moderate pain or breakthrough pain.     pantoprazole 40 MG tablet  Commonly known as:  PROTONIX  Take 1 tablet (40 mg total) by mouth daily at 12 noon.     predniSONE 10 MG tablet  Commonly known as:  DELTASONE  Take 4 tablets by mouth daily X 1 day; then 3 tablets by mouth daily X 2 days; then 2 tablets by mouth daily X 2 days; then 1 tablet by mouth daily X 3 days and stop prednisone.     pregabalin 25 MG capsule  Commonly known as:  LYRICA  Take 1 capsule (25 mg total) by mouth 2 (two) times daily.     sterile water for irrigation  Irrigate with 1,000 mLs as directed once.     zolpidem 5 MG tablet  Commonly known as:  AMBIEN  Take 1 tablet (5 mg total) by mouth at bedtime.        Filed Vitals:   09/01/14 1558  BP:   Pulse: 88  Temp:   Resp: 16    Skin clean, dry and intact without evidence of skin break down, no evidence of skin  tears noted. IV catheter discontinued intact. Site without signs and symptoms of complications. Dressing and pressure applied. Pt denies pain at this time. No complaints noted.  An After Visit Summary was printed and given to the patient. Patient escorted via WC, and D/C home via private auto.  Rondel JumboDumas, [name] S 09/01/2014 4:29 PM

## 2014-09-03 ENCOUNTER — Ambulatory Visit (HOSPITAL_COMMUNITY)
Admit: 2014-09-03 | Discharge: 2014-09-03 | Disposition: A | Discharge status: Home / Self Care | Payer: Medicaid Other | Source: Ambulatory Visit | Attending: Pulmonary Disease | Admitting: Pulmonary Disease

## 2014-09-03 DIAGNOSIS — Z930 Tracheostomy status: Secondary | ICD-10-CM | POA: Insufficient documentation

## 2014-09-03 DIAGNOSIS — J45909 Unspecified asthma, uncomplicated: Secondary | ICD-10-CM | POA: Diagnosis present

## 2014-09-03 DIAGNOSIS — F419 Anxiety disorder, unspecified: Secondary | ICD-10-CM | POA: Insufficient documentation

## 2014-09-03 DIAGNOSIS — J449 Chronic obstructive pulmonary disease, unspecified: Secondary | ICD-10-CM | POA: Insufficient documentation

## 2014-09-03 DIAGNOSIS — Z87891 Personal history of nicotine dependence: Secondary | ICD-10-CM | POA: Diagnosis not present

## 2014-09-03 NOTE — Progress Notes (Signed)
Tracheostomy Procedure Note  Julie Kerr [id number] 10/04/1975  Pre Procedure Tracheostomy Information  Trach Brand: Shiley Size: 4.0 Style:uncuffed Secured by: Velcro  Vital Signs:  HR 89, RR 20, BP 129/82, SpO2 100% on RA  Procedure:  Patient decannulated with no complications.  Occlusive dressing with vaseline gauze placed over site.  Patient tolerated well.  No complications noted.    Vitals:  HR 84, RR 22, BP 104/64, SpO2 98% immediately post procedure.               HR 84, RR 20, BP 120/80, SpO2 100% 5 minutes post procedure              Post Procedure Tracheostomy Information   Post Procedure Evaluation:   Vital signs:blood pressure 122/71, pulse 78, respirations 18 and pulse oximetry 97 % Patients current condition: stable Complications: No apparent complications Trach site exam: clean, dry, no drainage Wound care done: dry, clean, 4 x 4 gauze and Other: Vaseline gauze applied Patient did tolerate procedure well.   Education: Dressing change procedure.    Prescription needs: none    Additional needs:none

## 2014-09-03 NOTE — Consult Note (Signed)
Reason for consult: trach follow up, possible decannulation  Referring MD: Simonds   Brief History   This is a 39 year old female w/ known h/o difficult to control asthma and severe VCD. Further c/b h/o polysubstance abuse. [facility] s/p prolonged stay for acute respiratory failure in the setting of asthmatic exacerbation most likely complicated by her VCD component. Since d/c she has been doing well. She reports no issues eating, talking w/ PMV valve and that since she has stopped smoking her activity tolerance has greatly improved. She returns to the trach clinic today on 1/20 for evaluation and possible decannulation.   Past Medical History  Diagnosis Date  . Asthma   . Anxiety   . COPD (chronic obstructive pulmonary disease)   . Bronchitis   . Heart murmur   . Shortness of breath    Family History  Problem Relation Age of Onset  . HIV Mother   . Heart disease Father   . CVA Father   . Heart disease Other   . Emphysema Maternal Grandmother     smoked  . Asthma Sister   . Clotting disorder Sister   . Clotting disorder Maternal Grandmother   . Lung cancer Maternal Grandmother     smoked   Allergies  Allergen Reactions  . Robitussin Dm [Dextromethorphan-Guaifenesin] Nausea And Vomiting  . Nsaids Hives  . Rayon, Purified Hives  . Tramadol Hives   History   Social History  . Marital Status: Single    Spouse Name: N/A    Number of Children: N/A  . Years of Education: N/A   Occupational History  . Not on file.   Social History Main Topics  . Smoking status: Former Smoker -- 2.00 packs/day for 17 years    Types: Cigarettes    Quit date: 06/15/2014  . Smokeless tobacco: Never Used  . Alcohol Use: 0.0 oz/week    0 Not specified per week     Comment: occasional  . Drug Use: No  . Sexual Activity: Yes    Birth Control/ Protection: Other-see comments     Comment: female partners only   Other Topics Concern  . Not on file    Social History Narrative    Review of Systems:   Bolds are positive  Constitutional: some weight  gain, night sweats, no Fevers, chills, fatigue .  HEENT: headaches, Sore throat, sneezing, nasal congestion, post nasal drip, Difficulty swallowing, Tooth/dental problems, visual complaints visual changes, ear ache. Tolerating PMV well CV:  chest pain, radiates: ,Orthopnea, PND, swelling in lower extremities, dizziness, palpitations, syncope.  GI  heartburn, indigestion, abdominal pain, nausea, vomiting, diarrhea, change in bowel habits, loss of appetite, bloody stools.  Resp: no cough, productive: , hemoptysis, dyspnea, chest pain, pleuritic.  Skin: rash or itching or icterus GU: dysuria, change in color of urine, urgency or frequency. flank pain, hematuria  MS: joint pain or swelling. decreased range of motion  Psych: change in mood or affect. depression or anxiety.  Neuro: difficulty with speech, weakness, numbness, ataxia   LMP 08/28/2014 Vss. Room air sats 98-100%   General appearance:  39 year old AAF w/ no acute distress w/ PMV capped  Mouth:  membranes and no mucosal ulcerations; normal hard and soft palate Neck: Trachea midline, # 4 trach,cuffless  trach stoma clean w/ excellent phonation during finger occlusion .  Lungs: CTA, with normal respiratory effort and no intercostal retractions CV: RRR, no MRGs  Abdomen: Soft, non-tender; no  masses or HSM Extremities: No peripheral edema or extremity lymphadenopathy Skin: Normal temperature, turgor and texture; no rash, ulcers or subcutaneous nodules Psych: Appropriate affect, alert and oriented to person, place and time  IMP/Plan 1) Asthma 2) severe VCD 3) h/o Tobacco abuse & polysubstance abuse 4) tracheostomy status   Discussion  Long discussion about her vocal cord dysfxn, as well as risk/benefit of trach vs no trach. She tolerated occlusion well at the bedside so she appeared to be ready from an airway stand-point. We  talked at length about the importance of: a) adhering to her asthma regimen b) no more smoking or polysubstance abuse c) the impact of anxiety and recommended she see psych  to assist w/ this. D) also discussed that w/out adherence to all of the above that the risk for re-intubation and permanent trach would be high. She was motivated to have the trach removed and verbalized that she understood these risks. We then went ahead w/ decannulation at the bedside. An occlusive dressing was placed. She was monitored for s/sx of increased distress and noted to tolerate this well. She was deemed ready for d/c from the trach clinic after monitoring with the following recommendations  Recs: Continue her current breathing regimens as prescribed by Dr Sherene Sires No smoking or substance abuse Occlusive dressing change BID No submerging under water  Think she would benefit for psych referral-->will defer this to her PCP.   Simonne Martinet ACNP-BC [facility] Pulmonary/Critical Care Pager # [phone] OR # [phone] if no answer   STAFF NOTE: I, Rory Percy, MD FACP have personally reviewed patient's available data, including medical history, events of note, physical examination and test results as part of my evaluation. I have discussed with resident/NP and other care providers such as pharmacist, RN and RRT. In addition, I personally evaluated patient and elicited key findings of: no distress, PMV all day, eating well, no secretions, occludes well with speech, nomral ambulation--> decannulation, have discussed issues to avoid recurrent resp failure with her, continued to see Dr Sherene Sires, see pcp / psychiatry for treatment anxiety, no cocaine further, etc, described full directions of trach site care and reasons to retunr , fistula etc  Mcarthur Rossetti. Tyson Alias, MD, FACP Pgr: [phone] [facility] 09/03/2014 4:08 PM

## 2014-09-18 ENCOUNTER — Telehealth: Payer: Self-pay | Admitting: Internal Medicine

## 2014-09-18 NOTE — Telephone Encounter (Signed)
lmomtcb x1 for Amy

## 2014-09-19 NOTE — Telephone Encounter (Signed)
lmtcb x2 

## 2014-09-22 NOTE — Telephone Encounter (Signed)
lmtcb x3 

## 2014-09-23 NOTE — Telephone Encounter (Signed)
Calling back [phone] ext [phone] Amy

## 2014-09-23 NOTE — Telephone Encounter (Signed)
[phone] amy miller ext [phone] calling back

## 2014-09-23 NOTE — Telephone Encounter (Signed)
lmtcb for Amy 

## 2014-09-23 NOTE — Telephone Encounter (Signed)
lmomtcb x1 

## 2014-09-25 NOTE — Telephone Encounter (Signed)
Amy called. She reports they faxed over a plan of care to Dr. Kendrick FriesMcQuaid. This was ordered from hospital. She is needing someone to sign these. Since MW is pt main pulmonologists, she is going to fax this over to him this AM. Lorain ChildesFYI for leslie

## 2014-10-07 ENCOUNTER — Emergency Department (HOSPITAL_COMMUNITY): Payer: Medicaid Other

## 2014-10-07 ENCOUNTER — Encounter (HOSPITAL_COMMUNITY): Payer: Self-pay | Admitting: Emergency Medicine

## 2014-10-07 ENCOUNTER — Inpatient Hospital Stay (HOSPITAL_COMMUNITY)
Admission: EM | Admit: 2014-10-07 | Discharge: 2014-10-08 | DRG: 378 | Disposition: A | Discharge status: Home / Self Care | Payer: Medicaid Other | Attending: Internal Medicine | Admitting: Internal Medicine

## 2014-10-07 DIAGNOSIS — Z981 Arthrodesis status: Secondary | ICD-10-CM

## 2014-10-07 DIAGNOSIS — Z7951 Long term (current) use of inhaled steroids: Secondary | ICD-10-CM

## 2014-10-07 DIAGNOSIS — E119 Type 2 diabetes mellitus without complications: Secondary | ICD-10-CM | POA: Diagnosis present

## 2014-10-07 DIAGNOSIS — N760 Acute vaginitis: Secondary | ICD-10-CM | POA: Diagnosis present

## 2014-10-07 DIAGNOSIS — K625 Hemorrhage of anus and rectum: Principal | ICD-10-CM | POA: Diagnosis present

## 2014-10-07 DIAGNOSIS — Z87891 Personal history of nicotine dependence: Secondary | ICD-10-CM

## 2014-10-07 DIAGNOSIS — K921 Melena: Secondary | ICD-10-CM

## 2014-10-07 DIAGNOSIS — Z825 Family history of asthma and other chronic lower respiratory diseases: Secondary | ICD-10-CM

## 2014-10-07 DIAGNOSIS — D649 Anemia, unspecified: Secondary | ICD-10-CM | POA: Diagnosis present

## 2014-10-07 DIAGNOSIS — J383 Other diseases of vocal cords: Secondary | ICD-10-CM | POA: Diagnosis present

## 2014-10-07 DIAGNOSIS — N390 Urinary tract infection, site not specified: Secondary | ICD-10-CM | POA: Diagnosis present

## 2014-10-07 DIAGNOSIS — F419 Anxiety disorder, unspecified: Secondary | ICD-10-CM | POA: Diagnosis present

## 2014-10-07 DIAGNOSIS — R0602 Shortness of breath: Secondary | ICD-10-CM | POA: Diagnosis present

## 2014-10-07 DIAGNOSIS — R061 Stridor: Secondary | ICD-10-CM | POA: Diagnosis not present

## 2014-10-07 DIAGNOSIS — J45909 Unspecified asthma, uncomplicated: Secondary | ICD-10-CM | POA: Diagnosis present

## 2014-10-07 DIAGNOSIS — I10 Essential (primary) hypertension: Secondary | ICD-10-CM | POA: Diagnosis present

## 2014-10-07 DIAGNOSIS — Z79899 Other long term (current) drug therapy: Secondary | ICD-10-CM

## 2014-10-07 DIAGNOSIS — J449 Chronic obstructive pulmonary disease, unspecified: Secondary | ICD-10-CM | POA: Diagnosis present

## 2014-10-07 HISTORY — DX: Essential (primary) hypertension: I10

## 2014-10-07 LAB — I-STAT CHEM 8, ED
BUN: 6 mg/dL (ref 6–23)
CREATININE: 0.8 mg/dL (ref 0.50–1.10)
Calcium, Ion: 1.18 mmol/L (ref 1.12–1.23)
Chloride: 99 mmol/L (ref 96–112)
Glucose, Bld: 103 mg/dL — ABNORMAL HIGH (ref 70–99)
HCT: 42 % (ref 36.0–46.0)
HEMOGLOBIN: 14.3 g/dL (ref 12.0–15.0)
POTASSIUM: 3.5 mmol/L (ref 3.5–5.1)
SODIUM: 143 mmol/L (ref 135–145)
TCO2: 25 mmol/L (ref 0–100)

## 2014-10-07 LAB — URINE MICROSCOPIC-ADD ON

## 2014-10-07 LAB — CBC WITH DIFFERENTIAL/PLATELET
BASOS ABS: 0 10*3/uL (ref 0.0–0.1)
Basophils Relative: 0 % (ref 0–1)
Eosinophils Absolute: 0.1 10*3/uL (ref 0.0–0.7)
Eosinophils Relative: 1 % (ref 0–5)
HCT: 36.1 % (ref 36.0–46.0)
HEMOGLOBIN: 11.7 g/dL — AB (ref 12.0–15.0)
Lymphocytes Relative: 28 % (ref 12–46)
Lymphs Abs: 2.6 10*3/uL (ref 0.7–4.0)
MCH: 24.7 pg — ABNORMAL LOW (ref 26.0–34.0)
MCHC: 32.4 g/dL (ref 30.0–36.0)
MCV: 76.2 fL — ABNORMAL LOW (ref 78.0–100.0)
MONOS PCT: 6 % (ref 3–12)
Monocytes Absolute: 0.6 10*3/uL (ref 0.1–1.0)
NEUTROS ABS: 6.1 10*3/uL (ref 1.7–7.7)
Neutrophils Relative %: 65 % (ref 43–77)
Platelets: 400 10*3/uL (ref 150–400)
RBC: 4.74 MIL/uL (ref 3.87–5.11)
RDW: 14.7 % (ref 11.5–15.5)
WBC: 9.3 10*3/uL (ref 4.0–10.5)

## 2014-10-07 LAB — WET PREP, GENITAL
Trich, Wet Prep: NONE SEEN
Yeast Wet Prep HPF POC: NONE SEEN

## 2014-10-07 LAB — URINALYSIS, ROUTINE W REFLEX MICROSCOPIC
Bilirubin Urine: NEGATIVE
GLUCOSE, UA: NEGATIVE mg/dL
Hgb urine dipstick: NEGATIVE
KETONES UR: NEGATIVE mg/dL
NITRITE: POSITIVE — AB
PH: 8.5 — AB (ref 5.0–8.0)
Protein, ur: 30 mg/dL — AB
SPECIFIC GRAVITY, URINE: 1.016 (ref 1.005–1.030)
Urobilinogen, UA: 0.2 mg/dL (ref 0.0–1.0)

## 2014-10-07 LAB — PREGNANCY, URINE: Preg Test, Ur: NEGATIVE

## 2014-10-07 LAB — POC OCCULT BLOOD, ED: Fecal Occult Bld: POSITIVE — AB

## 2014-10-07 MED ORDER — RACEPINEPHRINE HCL 2.25 % IN NEBU
INHALATION_SOLUTION | RESPIRATORY_TRACT | Status: AC
  Filled 2014-10-07: qty 0.5

## 2014-10-07 MED ORDER — METHYLPREDNISOLONE SODIUM SUCC 40 MG IJ SOLR
40.0000 mg | Freq: Two times a day (BID) | INTRAMUSCULAR | Status: DC
  Administered 2014-10-07 – 2014-10-08 (×2): 40 mg via INTRAVENOUS
  Filled 2014-10-07 (×3): qty 1

## 2014-10-07 MED ORDER — HYDROMORPHONE HCL 1 MG/ML IJ SOLN
1.0000 mg | Freq: Once | INTRAMUSCULAR | Status: AC
Start: 2014-10-07 — End: 2014-10-07
  Administered 2014-10-07: 1 mg via INTRAVENOUS
  Filled 2014-10-07: qty 1

## 2014-10-07 MED ORDER — FENTANYL CITRATE 0.05 MG/ML IJ SOLN
INTRAMUSCULAR | Status: AC
  Filled 2014-10-07: qty 2

## 2014-10-07 MED ORDER — HYDROMORPHONE HCL 1 MG/ML IJ SOLN
1.0000 mg | Freq: Once | INTRAMUSCULAR | Status: AC
  Administered 2014-10-07: 1 mg via INTRAVENOUS

## 2014-10-07 MED ORDER — LORAZEPAM 2 MG/ML IJ SOLN
INTRAMUSCULAR | Status: AC
  Filled 2014-10-07: qty 1

## 2014-10-07 MED ORDER — IOHEXOL 300 MG/ML  SOLN
100.0000 mL | Freq: Once | INTRAMUSCULAR | Status: AC | PRN
  Administered 2014-10-07: 100 mL via INTRAVENOUS

## 2014-10-07 MED ORDER — LORAZEPAM 2 MG/ML IJ SOLN
1.0000 mg | Freq: Once | INTRAMUSCULAR | Status: AC
  Administered 2014-10-07: 1 mg via INTRAVENOUS

## 2014-10-07 MED ORDER — DEXTROSE 5 % IV SOLN
1.0000 g | Freq: Once | INTRAVENOUS | Status: AC
  Administered 2014-10-07: 1 g via INTRAVENOUS
  Filled 2014-10-07: qty 10

## 2014-10-07 MED ORDER — RACEPINEPHRINE HCL 2.25 % IN NEBU
0.5000 mL | INHALATION_SOLUTION | RESPIRATORY_TRACT | Status: AC
  Administered 2014-10-07: 0.5 mL via RESPIRATORY_TRACT

## 2014-10-07 MED ORDER — FENTANYL CITRATE 0.05 MG/ML IJ SOLN
100.0000 ug | Freq: Once | INTRAMUSCULAR | Status: AC
Start: 2014-10-07 — End: 2014-10-07
  Administered 2014-10-07: 100 ug via INTRAVENOUS

## 2014-10-07 MED ORDER — HYDROMORPHONE HCL 1 MG/ML IJ SOLN
INTRAMUSCULAR | Status: AC
  Filled 2014-10-07: qty 1

## 2014-10-07 MED ORDER — LORAZEPAM 2 MG/ML IJ SOLN
1.0000 mg | Freq: Once | INTRAMUSCULAR | Status: AC
  Administered 2014-10-07: 1 mg via INTRAVENOUS
  Filled 2014-10-07: qty 1

## 2014-10-07 NOTE — ED Notes (Signed)
CT called and informed pt finished her contrast.

## 2014-10-07 NOTE — ED Notes (Signed)
MD at bedside. 

## 2014-10-07 NOTE — ED Notes (Signed)
Pt presents to the department c.o SOB, chest pain, and lower abdominal pain. Pt reports she had a tracheostomy removed on Feb 20. Stridor present upon assessment. Pt tearful. A&O X4. Hx of abdominal hernia.
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
## 2014-10-08 ENCOUNTER — Encounter: Payer: Self-pay | Admitting: Obstetrics & Gynecology

## 2014-10-08 ENCOUNTER — Encounter (HOSPITAL_COMMUNITY): Payer: Self-pay | Admitting: Internal Medicine

## 2014-10-08 DIAGNOSIS — D649 Anemia, unspecified: Secondary | ICD-10-CM | POA: Diagnosis present

## 2014-10-08 DIAGNOSIS — Z79899 Other long term (current) drug therapy: Secondary | ICD-10-CM | POA: Diagnosis not present

## 2014-10-08 DIAGNOSIS — N390 Urinary tract infection, site not specified: Secondary | ICD-10-CM | POA: Insufficient documentation

## 2014-10-08 DIAGNOSIS — E119 Type 2 diabetes mellitus without complications: Secondary | ICD-10-CM | POA: Diagnosis present

## 2014-10-08 DIAGNOSIS — J449 Chronic obstructive pulmonary disease, unspecified: Secondary | ICD-10-CM | POA: Diagnosis present

## 2014-10-08 DIAGNOSIS — I10 Essential (primary) hypertension: Secondary | ICD-10-CM | POA: Diagnosis present

## 2014-10-08 DIAGNOSIS — K625 Hemorrhage of anus and rectum: Secondary | ICD-10-CM | POA: Diagnosis present

## 2014-10-08 DIAGNOSIS — R061 Stridor: Secondary | ICD-10-CM | POA: Diagnosis not present

## 2014-10-08 DIAGNOSIS — Z7951 Long term (current) use of inhaled steroids: Secondary | ICD-10-CM | POA: Diagnosis not present

## 2014-10-08 DIAGNOSIS — K921 Melena: Secondary | ICD-10-CM | POA: Diagnosis present

## 2014-10-08 DIAGNOSIS — Z87891 Personal history of nicotine dependence: Secondary | ICD-10-CM | POA: Diagnosis not present

## 2014-10-08 DIAGNOSIS — J45909 Unspecified asthma, uncomplicated: Secondary | ICD-10-CM | POA: Diagnosis present

## 2014-10-08 DIAGNOSIS — Z981 Arthrodesis status: Secondary | ICD-10-CM | POA: Diagnosis not present

## 2014-10-08 DIAGNOSIS — N760 Acute vaginitis: Secondary | ICD-10-CM | POA: Diagnosis present

## 2014-10-08 DIAGNOSIS — R0602 Shortness of breath: Secondary | ICD-10-CM

## 2014-10-08 DIAGNOSIS — Z825 Family history of asthma and other chronic lower respiratory diseases: Secondary | ICD-10-CM | POA: Diagnosis not present

## 2014-10-08 DIAGNOSIS — F419 Anxiety disorder, unspecified: Secondary | ICD-10-CM | POA: Diagnosis present

## 2014-10-08 LAB — I-STAT CG4 LACTIC ACID, ED: Lactic Acid, Venous: 1.11 mmol/L (ref 0.5–2.0)

## 2014-10-08 LAB — CBC WITH DIFFERENTIAL/PLATELET
Basophils Absolute: 0 10*3/uL (ref 0.0–0.1)
Basophils Relative: 0 % (ref 0–1)
EOS ABS: 0 10*3/uL (ref 0.0–0.7)
EOS PCT: 0 % (ref 0–5)
HCT: 34.3 % — ABNORMAL LOW (ref 36.0–46.0)
Hemoglobin: 10.8 g/dL — ABNORMAL LOW (ref 12.0–15.0)
LYMPHS PCT: 7 % — AB (ref 12–46)
Lymphs Abs: 0.7 10*3/uL (ref 0.7–4.0)
MCH: 25.1 pg — AB (ref 26.0–34.0)
MCHC: 31.5 g/dL (ref 30.0–36.0)
MCV: 79.6 fL (ref 78.0–100.0)
MONO ABS: 0.1 10*3/uL (ref 0.1–1.0)
MONOS PCT: 1 % — AB (ref 3–12)
Neutro Abs: 9.2 10*3/uL — ABNORMAL HIGH (ref 1.7–7.7)
Neutrophils Relative %: 92 % — ABNORMAL HIGH (ref 43–77)
Platelets: 387 10*3/uL (ref 150–400)
RBC: 4.31 MIL/uL (ref 3.87–5.11)
RDW: 15.1 % (ref 11.5–15.5)
WBC: 10 10*3/uL (ref 4.0–10.5)

## 2014-10-08 LAB — COMPREHENSIVE METABOLIC PANEL
ALBUMIN: 3.7 g/dL (ref 3.5–5.2)
ALT: 11 U/L (ref 0–35)
ANION GAP: 12 (ref 5–15)
AST: 23 U/L (ref 0–37)
Alkaline Phosphatase: 57 U/L (ref 39–117)
BILIRUBIN TOTAL: 0.4 mg/dL (ref 0.3–1.2)
BUN: 6 mg/dL (ref 6–23)
CALCIUM: 9.5 mg/dL (ref 8.4–10.5)
CHLORIDE: 100 mmol/L (ref 96–112)
CO2: 23 mmol/L (ref 19–32)
CREATININE: 1.01 mg/dL (ref 0.50–1.10)
GFR calc Af Amer: 82 mL/min — ABNORMAL LOW (ref >90)
GFR calc non Af Amer: 71 mL/min — ABNORMAL LOW (ref >90)
Glucose, Bld: 198 mg/dL — ABNORMAL HIGH (ref 70–99)
Potassium: 3.6 mmol/L (ref 3.5–5.1)
Sodium: 135 mmol/L (ref 135–145)
Total Protein: 7 g/dL (ref 6.0–8.3)

## 2014-10-08 LAB — TSH: TSH: 0.419 u[IU]/mL (ref 0.350–4.500)

## 2014-10-08 LAB — MRSA PCR SCREENING: MRSA BY PCR: NEGATIVE

## 2014-10-08 LAB — GC/CHLAMYDIA PROBE AMP (~~LOC~~) NOT AT ARMC
CHLAMYDIA, DNA PROBE: NEGATIVE
NEISSERIA GONORRHEA: NEGATIVE

## 2014-10-08 MED ORDER — METRONIDAZOLE IN NACL 5-0.79 MG/ML-% IV SOLN
500.0000 mg | Freq: Three times a day (TID) | INTRAVENOUS | Status: DC
  Administered 2014-10-08: 500 mg via INTRAVENOUS
  Filled 2014-10-08 (×3): qty 100

## 2014-10-08 MED ORDER — METRONIDAZOLE 500 MG PO TABS: 500.0000 mg | ORAL_TABLET | Freq: Two times a day (BID) | ORAL | Status: DC

## 2014-10-08 MED ORDER — PREDNISONE 10 MG PO TABS: ORAL_TABLET | ORAL | Status: DC

## 2014-10-08 MED ORDER — ALBUTEROL SULFATE (2.5 MG/3ML) 0.083% IN NEBU: 2.5000 mg | INHALATION_SOLUTION | RESPIRATORY_TRACT | Status: DC | PRN

## 2014-10-08 MED ORDER — PREGABALIN 25 MG PO CAPS
25.0000 mg | ORAL_CAPSULE | Freq: Two times a day (BID) | ORAL | Status: DC
  Administered 2014-10-08 (×2): 25 mg via ORAL
  Filled 2014-10-08 (×2): qty 1

## 2014-10-08 MED ORDER — PREDNISONE 20 MG PO TABS
40.0000 mg | ORAL_TABLET | Freq: Two times a day (BID) | ORAL | Status: DC
  Filled 2014-10-08 (×2): qty 2

## 2014-10-08 MED ORDER — FENTANYL CITRATE 0.05 MG/ML IJ SOLN
100.0000 ug | Freq: Once | INTRAMUSCULAR | Status: AC
  Administered 2014-10-08: 100 ug via INTRAVENOUS
  Filled 2014-10-08: qty 2

## 2014-10-08 MED ORDER — ALPRAZOLAM 0.5 MG PO TABS
1.0000 mg | ORAL_TABLET | Freq: Three times a day (TID) | ORAL | Status: DC | PRN
  Administered 2014-10-08 (×2): 1 mg via ORAL
  Filled 2014-10-08 (×2): qty 2

## 2014-10-08 MED ORDER — OXYCODONE HCL 5 MG PO TABS
15.0000 mg | ORAL_TABLET | Freq: Four times a day (QID) | ORAL | Status: DC | PRN
  Administered 2014-10-08 (×2): 15 mg via ORAL
  Filled 2014-10-08 (×2): qty 3

## 2014-10-08 MED ORDER — IPRATROPIUM-ALBUTEROL 0.5-2.5 (3) MG/3ML IN SOLN
3.0000 mL | Freq: Four times a day (QID) | RESPIRATORY_TRACT | Status: DC
  Administered 2014-10-08: 3 mL via RESPIRATORY_TRACT
  Filled 2014-10-08: qty 3

## 2014-10-08 MED ORDER — MORPHINE SULFATE ER 15 MG PO TBCR
30.0000 mg | EXTENDED_RELEASE_TABLET | Freq: Two times a day (BID) | ORAL | Status: DC
  Administered 2014-10-08 (×2): 30 mg via ORAL
  Filled 2014-10-08 (×2): qty 2

## 2014-10-08 MED ORDER — ALBUTEROL SULFATE (2.5 MG/3ML) 0.083% IN NEBU
2.5000 mg | INHALATION_SOLUTION | RESPIRATORY_TRACT | Status: DC
  Administered 2014-10-08: 2.5 mg via RESPIRATORY_TRACT
  Filled 2014-10-08: qty 3

## 2014-10-08 MED ORDER — ONDANSETRON HCL 4 MG PO TABS: 4.0000 mg | ORAL_TABLET | Freq: Four times a day (QID) | ORAL | Status: DC | PRN

## 2014-10-08 MED ORDER — CIPROFLOXACIN HCL 500 MG PO TABS
500.0000 mg | ORAL_TABLET | Freq: Two times a day (BID) | ORAL | Status: DC
  Filled 2014-10-08 (×2): qty 1

## 2014-10-08 MED ORDER — ACETAMINOPHEN 325 MG PO TABS: 650.0000 mg | ORAL_TABLET | Freq: Four times a day (QID) | ORAL | Status: DC | PRN

## 2014-10-08 MED ORDER — DIPHENHYDRAMINE HCL 25 MG PO CAPS: 25.0000 mg | ORAL_CAPSULE | Freq: Four times a day (QID) | ORAL | Status: DC | PRN

## 2014-10-08 MED ORDER — METRONIDAZOLE 500 MG PO TABS
500.0000 mg | ORAL_TABLET | Freq: Two times a day (BID) | ORAL | Status: DC
  Administered 2014-10-08: 500 mg via ORAL
  Filled 2014-10-08 (×2): qty 1

## 2014-10-08 MED ORDER — ZOLPIDEM TARTRATE 5 MG PO TABS: 5.0000 mg | ORAL_TABLET | Freq: Every day | ORAL | Status: DC

## 2014-10-08 MED ORDER — BUDESONIDE 0.25 MG/2ML IN SUSP
0.2500 mg | Freq: Two times a day (BID) | RESPIRATORY_TRACT | Status: DC
  Administered 2014-10-08: 0.25 mg via RESPIRATORY_TRACT
  Filled 2014-10-08 (×3): qty 2

## 2014-10-08 MED ORDER — PANTOPRAZOLE SODIUM 40 MG PO TBEC
40.0000 mg | DELAYED_RELEASE_TABLET | Freq: Every day | ORAL | Status: DC
  Administered 2014-10-08: 40 mg via ORAL
  Filled 2014-10-08: qty 1

## 2014-10-08 MED ORDER — HYDROCODONE-HOMATROPINE 5-1.5 MG/5ML PO SYRP: 5.0000 mL | ORAL_SOLUTION | Freq: Four times a day (QID) | ORAL | Status: DC | PRN

## 2014-10-08 MED ORDER — ALBUTEROL SULFATE (2.5 MG/3ML) 0.083% IN NEBU
2.5000 mg | INHALATION_SOLUTION | RESPIRATORY_TRACT | Status: DC
  Administered 2014-10-08: 2.5 mg via RESPIRATORY_TRACT
  Filled 2014-10-08 (×2): qty 3

## 2014-10-08 MED ORDER — ACETAMINOPHEN 650 MG RE SUPP: 650.0000 mg | Freq: Four times a day (QID) | RECTAL | Status: DC | PRN

## 2014-10-08 MED ORDER — SODIUM CHLORIDE 0.9 % IJ SOLN
3.0000 mL | Freq: Two times a day (BID) | INTRAMUSCULAR | Status: DC
  Administered 2014-10-08 (×2): 3 mL via INTRAVENOUS

## 2014-10-08 MED ORDER — ONDANSETRON HCL 4 MG/2ML IJ SOLN: 4.0000 mg | Freq: Four times a day (QID) | INTRAMUSCULAR | Status: DC | PRN

## 2014-10-08 MED ORDER — CIPROFLOXACIN IN D5W 400 MG/200ML IV SOLN
400.0000 mg | Freq: Two times a day (BID) | INTRAVENOUS | Status: DC
  Administered 2014-10-08: 400 mg via INTRAVENOUS
  Filled 2014-10-08 (×2): qty 200

## 2014-10-08 MED ORDER — CIPROFLOXACIN HCL 500 MG PO TABS: 500.0000 mg | ORAL_TABLET | Freq: Two times a day (BID) | ORAL | Status: DC
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
## 2014-10-08 NOTE — Consult Note (Signed)
Name: Julie Kerr MRN: [id number] DOB: 12/12/1975    ADMISSION DATE:  10/07/2014 CONSULTATION DATE:  10/07/14  REFERRING MD :  Derrel NipEDP Pickering  CHIEF COMPLAINT:  Upper airway stridor  BRIEF PATIENT DESCRIPTION:  39 yr old VCD, recent trach and decannulation, anxiety presents with pelvis pain secondary to forced "dildo" trauma and exacerbation VCD  SIGNIFICANT EVENTS   STUDIES:  CT pevis 2/23>>>   HISTORY OF PRESENT ILLNESS:  39 yr old AAF polysubstance abuse, VCD, recent trach with decannulation presents with 2 week h/o Dildo forceful trauma to rectum with subsequent rectal bleeding. Presented with pain and associated worsening breathing to ER. Called as increase RR, expiratory neck sounds and concern distress. Able to speak full sentences. Anxiety noted by EDP.  PAST MEDICAL HISTORY :   has a past medical history of Asthma; Anxiety; COPD (chronic obstructive pulmonary disease); Bronchitis; Heart murmur; Shortness of breath; Diabetes mellitus without complication; and Hypertension.  has past surgical history that includes Spinal fusion and Tracheostomy (December 2015). Prior to Admission medications   Medication Sig Start Date End Date Taking? Authorizing Provider  albuterol (PROVENTIL) (2.5 MG/3ML) 0.083% nebulizer solution Take 3 mLs by nebulization every 2 (two) hours as needed for wheezing or shortness of breath.  08/21/14  Yes Historical Provider, MD  ALPRAZolam Prudy Feeler(XANAX) 1 MG tablet Take 1 tablet (1 mg total) by mouth 3 (three) times daily as needed for anxiety. 09/01/14  Yes Vassie Lollarlos Madera, MD  budesonide (PULMICORT) 0.25 MG/2ML nebulizer solution Take 2 mLs (0.25 mg total) by nebulization 2 (two) times daily. 09/01/14  Yes Vassie Lollarlos Madera, MD  diphenhydrAMINE (BENADRYL) 25 MG tablet Take 25-50 mg by mouth every 6 (six) hours as needed for itching.   Yes Historical Provider, MD  ipratropium-albuterol (DUONEB) 0.5-2.5 (3) MG/3ML SOLN Take 3 mLs by nebulization every 6 (six) hours as needed  (SOB, wheezing). 09/01/14  Yes Vassie Lollarlos Madera, MD  levofloxacin (LEVAQUIN) 750 MG tablet Take 1 tablet (750 mg total) by mouth daily. 09/01/14  Yes Vassie Lollarlos Madera, MD  Mometasone Furo-Formoterol Fum (DULERA IN) Inhale 1-2 puffs into the lungs daily.   Yes Historical Provider, MD  morphine (MS CONTIN) 30 MG 12 hr tablet Take 1 tablet (30 mg total) by mouth every 12 (twelve) hours. 09/01/14  Yes Vassie Lollarlos Madera, MD  nicotine (NICODERM CQ - DOSED IN MG/24 HOURS) 21 mg/24hr patch Place 1 patch (21 mg total) onto the skin daily. 06/01/14  Yes Kathlen ModyVijaya Akula, MD  oxyCODONE (ROXICODONE) 15 MG immediate release tablet Take 1 tablet (15 mg total) by mouth every 6 (six) hours as needed for moderate pain or breakthrough pain. 09/01/14  Yes Vassie Lollarlos Madera, MD  pantoprazole (PROTONIX) 40 MG tablet Take 1 tablet (40 mg total) by mouth daily at 12 noon. 09/01/14  Yes Vassie Lollarlos Madera, MD  predniSONE (DELTASONE) 10 MG tablet Take 4 tablets by mouth daily X 1 day; then 3 tablets by mouth daily X 2 days; then 2 tablets by mouth daily X 2 days; then 1 tablet by mouth daily X 3 days and stop prednisone. 09/01/14  Yes Vassie Lollarlos Madera, MD  pregabalin (LYRICA) 25 MG capsule Take 1 capsule (25 mg total) by mouth 2 (two) times daily. 09/01/14  Yes Vassie Lollarlos Madera, MD  tiotropium (SPIRIVA) 18 MCG inhalation capsule Place 18 mcg into inhaler and inhale daily.   Yes Historical Provider, MD  Water For Irrigation, Sterile (STERILE WATER FOR IRRIGATION) Irrigate with 1,000 mLs as directed once. Patient taking differently: Irrigate with 1,000 mLs as directed  daily.  08/06/14  Yes Duayne Cal, NP  zolpidem (AMBIEN) 5 MG tablet Take 1 tablet (5 mg total) by mouth at bedtime. 09/01/14  Yes Vassie Loll, MD  chlorpheniramine-HYDROcodone (TUSSIONEX) 10-8 MG/5ML LQCR Take 5 mLs by mouth every 12 (twelve) hours as needed for cough. Patient not taking: Reported on 10/07/2014 09/01/14   Vassie Loll, MD   Allergies  Allergen Reactions  . Robitussin Dm  [Dextromethorphan-Guaifenesin] Nausea And Vomiting  . Nsaids Hives  . Rayon, Purified Hives  . Tramadol Hives    FAMILY HISTORY:  family history includes Asthma in her sister; CVA in her father; Clotting disorder in her maternal grandmother and sister; Emphysema in her maternal grandmother; HIV in her mother; Heart disease in her father and other; Lung cancer in her maternal grandmother. SOCIAL HISTORY:  reports that she quit smoking about 3 months ago. Her smoking use included Cigarettes. She has a 34 pack-year smoking history. She has never used smokeless tobacco. She reports that she drinks alcohol. She reports that she does not use illicit drugs.  REVIEW OF SYSTEMS:   diffuct to ilicit with wheezing and some distress comlpained of rectal pain No fevers  SUBJECTIVE: rectum pain  VITAL SIGNS: Temp:  [97.4 F (36.3 C)-98.8 F (37.1 C)] 97.4 F (36.3 C) (02/24 0800) Pulse Rate:  [77-112] 105 (02/24 1100) Resp:  [15-24] 22 (02/24 1100) BP: (86-133)/(39-95) 111/65 mmHg (02/24 1100) SpO2:  [94 %-100 %] 100 % (02/24 1100) Weight:  [83.5 kg (184 lb 1.4 oz)-85.73 kg (189 lb)] 83.5 kg (184 lb 1.4 oz) (02/24 0256)  PHYSICAL EXAMINATION: General:  Mild distress Neuro:  Alert, anxious, nonfocal HEENT:  NO stridor, expiratory sounds moderate neck radiation to apical lung fields Cardiovascular:  s1 s2 RRR Lungs:  Mild radiated wheezing expiration only Abdomen:  Soft, BS hypo, NT, ND Musculoskeletal:  No edema Skin:  No rash   Recent Labs Lab 10/07/14 2052 10/08/14 0338  NA 143 135  K 3.5 3.6  CL 99 100  CO2  --  23  BUN 6 6  CREATININE 0.80 1.01  GLUCOSE 103* 198*    Recent Labs Lab 10/07/14 2041 10/07/14 2052 10/08/14 0338  HGB 11.7* 14.3 10.8*  HCT 36.1 42.0 34.3*  WBC 9.3  --  10.0  PLT 400  --  387   Ct Abdomen Pelvis W Contrast  10/08/2014   CLINICAL DATA:  Sharp lower abdominal pain. "Pain began after forceful vaginal penetration with a dildo 2 weeks ago."   EXAM: CT ABDOMEN AND PELVIS WITH CONTRAST  TECHNIQUE: Multidetector CT imaging of the abdomen and pelvis was performed using the standard protocol following bolus administration of intravenous contrast.  CONTRAST:  OMNIPAQUE IOHEXOL 300 MG/ML  SOLN  COMPARISON:  Radiograph earlier this day.  No prior CT.  FINDINGS: Mild hypoventilatory changes at the lung bases.  The liver, gallbladder, spleen, pancreas, and adrenal glands are normal. The kidneys demonstrate symmetric enhancement and excretion. There is no hydronephrosis or perinephric stranding. There is a 10 mm cyst in the interpolar region of the right kidney. Stomach is physiologically distended with oral contrast. Abdominal bowel loops are normal. No obstruction. No free air or free fluid.  There is an umbilical hernia containing mesenteric fat. No bowel involvement. The abdominal aorta is normal in caliber. There is no mesenteric or retroperitoneal adenopathy.  Within the pelvis the cervix is poorly defined. There is diffuse soft tissue prominence in the region of the cervix with small amount of intra  cervix air distally. The uterus is bulbous and enlarged with a probable right posterior fibroid measuring 5.8 x 4.8 cm. There is soft tissue prominence in the region of the rectum. No evidence of perforation or extraluminal air. There is a small amount of simple free fluid in the pelvis. Ovaries are tentatively identified and normal in size. Pelvic bowel loops are normal. The appendix is not definitively identified. Bladder is physiologically distended and normal.  Posterior fusion at L5-S1. 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  IMPRESSION: 1. Soft tissue prominence in the region of the cervix which is poorly defined, small amount of intra cervix air seen distally. Additionally there is soft tissue thickening of the rectosigmoid colon. There is no extraluminal air or findings to suggest perforation or abscess. 2. Bulbous appearance of the  uterus with probable posterior fundal fibroid. 3. Incidental findings of fat containing umbilical hernia, 1 cm right renal cyst, and avascular necrosis of both femoral heads.   Electronically Signed   By: Rubye OaksMelanie  Ehinger M.D.   On: 10/08/2014 00:18   Dg Chest Portable 1 View  10/07/2014   CLINICAL DATA:  Severe shortness of breath and chest pain, sudden onset today. Umbilical hernia. Nonsmoker.  EXAM: PORTABLE CHEST - 1 VIEW  COMPARISON:  08/28/2014  FINDINGS: Interval removal of endotracheal tube. The heart size and mediastinal contours are within normal limits. Both lungs are clear. The visualized skeletal structures are unremarkable.  IMPRESSION: No active disease.   Electronically Signed   By: Burman NievesWilliam  Stevens M.D.   On: 10/07/2014 21:33   Dg Abd Portable 1v  10/07/2014   CLINICAL DATA:  Abdominal pain.  Known umbilical hernia.  EXAM: PORTABLE ABDOMEN - 1 VIEW  COMPARISON:  Abdominal radiograph 07/31/2014  FINDINGS: There is no free intra-abdominal air identified on supine single-view. No dilated bowel loops to suggest obstruction. Small volume of stool throughout the colon. No radiopaque calculi. Postsurgical change at the lumbosacral junction. No acute osseous abnormalities are seen.  IMPRESSION: Normal bowel gas pattern.   Electronically Signed   By: Rubye OaksMelanie  Ehinger M.D.   On: 10/07/2014 21:35    ASSESSMENT / PLAN:  Known Severe VCD S/p recent trach and decannulation Resp distress > improved Anxiety Polysubstancec abuse Forceful trauma to rectum area  PLAN: Anxiety plays a BIG part in her VCD, will need control of this Pain control > Treat with benzo, narcotics Solumedrol 40 q12h > transition to Pred 2/25  Would continue duoneb for a day, then transition her back to her usual dulera bid + spiriva qd Send tox screen No indication for FOB or reck imaging currently Will order cough suppression Stable for transfer to floor from pulm standpoint.    Levy Pupaobert Geo Slone, MD, PhD 10/08/2014,  12:11 PM [facility] [phone] or if no answer [phone]

## 2014-10-08 NOTE — Progress Notes (Signed)
Pt discharged to home at 1345. Pt sent with belongings. Pt taken by wheelchair with RN and friend picked pt up. Vitals stable, discharge paperwork reviewed with pt, IV's removed.

## 2014-10-08 NOTE — Progress Notes (Signed)
Utilization Review Completed.Julie Kerr T2/24/2016  

## 2014-10-08 NOTE — H&P (Signed)
[facility] History and Physical  Julie Kerr ZOX:[id number] DOB: 09/05/1975 DOA: 10/07/2014  Referring physician: ER physician. PCP: Lu DuffelWELLS,WENDELL, MD   Chief Complaint: Shortness of breath.  HPI: Julie Schildermanda Silos is a 39 y.o. female history of vocal cord dysfunction who was recently admitted in December and was intubated and also had trach which was decannulated presents to the ER because of shortness of breath. Patient states the symptoms started after she started developing abdominal pain after she had rectal bleeding following use of sexual toy - Dildo. In the ER CT abdomen and pelvis shows soft tissues thickening of the rectosigmoid colon with some air in the cervix. Since patient was short of breath and wheezing on-call critical care was consulted and at this time they have requested to continue with Solu-Medrol and nebulizer and they will be seeing patient as consult. Patient had 2 episodes of rectal bleeding. Patient still has pain around the lower abdominal area. Denies any vomiting or diarrhea.   Review of Systems: As presented in the history of presenting illness, rest negative.  Past Medical History  Diagnosis Date  . Asthma   . Anxiety   . COPD (chronic obstructive pulmonary disease)   . Bronchitis   . Heart murmur   . Shortness of breath   . Diabetes mellitus without complication   . Hypertension    Past Surgical History  Procedure Laterality Date  . Spinal fusion    . Tracheostomy  December 2015   Social History:  reports that she quit smoking about 3 months ago. Her smoking use included Cigarettes. She has a 34 pack-year smoking history. She has never used smokeless tobacco. She reports that she drinks alcohol. She reports that she does not use illicit drugs. Where does patient live home. Can patient participate in ADLs? Yes.  Allergies  Allergen Reactions  . Robitussin Dm [Dextromethorphan-Guaifenesin] Nausea And Vomiting  . Nsaids Hives  . Rayon, Purified  Hives  . Tramadol Hives    Family History:  Family History  Problem Relation Age of Onset  . HIV Mother   . Heart disease Father   . CVA Father   . Heart disease Other   . Emphysema Maternal Grandmother     smoked  . Asthma Sister   . Clotting disorder Sister   . Clotting disorder Maternal Grandmother   . Lung cancer Maternal Grandmother     smoked      Prior to Admission medications   Medication Sig Start Date End Date Taking? Authorizing Provider  albuterol (PROVENTIL) (2.5 MG/3ML) 0.083% nebulizer solution Take 3 mLs by nebulization every 2 (two) hours as needed for wheezing or shortness of breath.  08/21/14  Yes Historical Provider, MD  ALPRAZolam Prudy Feeler(XANAX) 1 MG tablet Take 1 tablet (1 mg total) by mouth 3 (three) times daily as needed for anxiety. 09/01/14  Yes Vassie Lollarlos Madera, MD  budesonide (PULMICORT) 0.25 MG/2ML nebulizer solution Take 2 mLs (0.25 mg total) by nebulization 2 (two) times daily. 09/01/14  Yes Vassie Lollarlos Madera, MD  diphenhydrAMINE (BENADRYL) 25 MG tablet Take 25-50 mg by mouth every 6 (six) hours as needed for itching.   Yes Historical Provider, MD  ipratropium-albuterol (DUONEB) 0.5-2.5 (3) MG/3ML SOLN Take 3 mLs by nebulization every 6 (six) hours as needed (SOB, wheezing). 09/01/14  Yes Vassie Lollarlos Madera, MD  levofloxacin (LEVAQUIN) 750 MG tablet Take 1 tablet (750 mg total) by mouth daily. 09/01/14  Yes Vassie Lollarlos Madera, MD  Mometasone Furo-Formoterol Fum (DULERA IN) Inhale 1-2 puffs into the lungs  daily.   Yes Historical Provider, MD  morphine (MS CONTIN) 30 MG 12 hr tablet Take 1 tablet (30 mg total) by mouth every 12 (twelve) hours. 09/01/14  Yes Vassie Loll, MD  nicotine (NICODERM CQ - DOSED IN MG/24 HOURS) 21 mg/24hr patch Place 1 patch (21 mg total) onto the skin daily. 06/01/14  Yes Kathlen Mody, MD  oxyCODONE (ROXICODONE) 15 MG immediate release tablet Take 1 tablet (15 mg total) by mouth every 6 (six) hours as needed for moderate pain or breakthrough pain. 09/01/14  Yes  Vassie Loll, MD  pantoprazole (PROTONIX) 40 MG tablet Take 1 tablet (40 mg total) by mouth daily at 12 noon. 09/01/14  Yes Vassie Loll, MD  predniSONE (DELTASONE) 10 MG tablet Take 4 tablets by mouth daily X 1 day; then 3 tablets by mouth daily X 2 days; then 2 tablets by mouth daily X 2 days; then 1 tablet by mouth daily X 3 days and stop prednisone. 09/01/14  Yes Vassie Loll, MD  pregabalin (LYRICA) 25 MG capsule Take 1 capsule (25 mg total) by mouth 2 (two) times daily. 09/01/14  Yes Vassie Loll, MD  tiotropium (SPIRIVA) 18 MCG inhalation capsule Place 18 mcg into inhaler and inhale daily.   Yes Historical Provider, MD  Water For Irrigation, Sterile (STERILE WATER FOR IRRIGATION) Irrigate with 1,000 mLs as directed once. Patient taking differently: Irrigate with 1,000 mLs as directed daily.  08/06/14  Yes Duayne Cal, NP  zolpidem (AMBIEN) 5 MG tablet Take 1 tablet (5 mg total) by mouth at bedtime. 09/01/14  Yes Vassie Loll, MD  chlorpheniramine-HYDROcodone (TUSSIONEX) 10-8 MG/5ML LQCR Take 5 mLs by mouth every 12 (twelve) hours as needed for cough. Patient not taking: Reported on 10/07/2014 09/01/14   Vassie Loll, MD    Physical Exam: Filed Vitals:   10/07/14 2315 10/08/14 0000 10/08/14 0115 10/08/14 0230  BP: 126/70 122/81 111/71   Pulse: 77 82 77 79  Temp:      TempSrc:      Resp:  Height:      Weight:      SpO2: 100% 95% 96% 96%     General:  Obese not in distress.  Eyes: Anicteric no pallor.  ENT: No discharge from the years eyes nose or mouth.  Neck: No mass felt.  Cardiovascular: S1 and S2 heard.  Respiratory: Mild bilateral wheezing no crepitations.  Abdomen: Soft nontender bowel sounds present.  Skin: No rash.  Musculoskeletal: No edema.  Psychiatric: Appears normal.  Neurologic: Alert awake oriented to time place and person. Moves all extremities.  Labs on Admission:  Basic Metabolic Panel:  Recent Labs Lab 10/07/14 2052  NA 143   K 3.5  CL 99  GLUCOSE 103*  BUN 6  CREATININE 0.80   Liver Function Tests: No results for input(s): AST, ALT, ALKPHOS, BILITOT, PROT, ALBUMIN in the last 168 hours. No results for input(s): LIPASE, AMYLASE in the last 168 hours. No results for input(s): AMMONIA in the last 168 hours. CBC:  Recent Labs Lab 10/07/14 2041 10/07/14 2052  WBC 9.3  --   NEUTROABS 6.1  --   HGB 11.7* 14.3  HCT 36.1 42.0  MCV 76.2*  --   PLT 400  --    Cardiac Enzymes: No results for input(s): CKTOTAL, CKMB, CKMBINDEX, TROPONINI in the last 168 hours.  BNP (last 3 results) No results for input(s): BNP in the last 8760 hours.  ProBNP (last 3 results)  Recent Labs  05/06/14 2328 07/25/14 1718 07/25/14 1844  PROBNP <5.0 <5.0 <5.0    CBG: No results for input(s): GLUCAP in the last 168 hours.  Radiological Exams on Admission: Ct Abdomen Pelvis W Contrast  10/08/2014   CLINICAL DATA:  Sharp lower abdominal pain. "Pain began after forceful vaginal penetration with a dildo 2 weeks ago."  EXAM: CT ABDOMEN AND PELVIS WITH CONTRAST  TECHNIQUE: Multidetector CT imaging of the abdomen and pelvis was performed using the standard protocol following bolus administration of intravenous contrast.  CONTRAST:  OMNIPAQUE IOHEXOL 300 MG/ML  SOLN  COMPARISON:  Radiograph earlier this day.  No prior CT.  FINDINGS: Mild hypoventilatory changes at the lung bases.  The liver, gallbladder, spleen, pancreas, and adrenal glands are normal. The kidneys demonstrate symmetric enhancement and excretion. There is no hydronephrosis or perinephric stranding. There is a 10 mm cyst in the interpolar region of the right kidney. Stomach is physiologically distended with oral contrast. Abdominal bowel loops are normal. No obstruction. No free air or free fluid.  There is an umbilical hernia containing mesenteric fat. No bowel involvement. The abdominal aorta is normal in caliber. There is no mesenteric or retroperitoneal  adenopathy.  Within the pelvis the cervix is poorly defined. There is diffuse soft tissue prominence in the region of the cervix with small amount of intra cervix air distally. The uterus is bulbous and enlarged with a probable right posterior fibroid measuring 5.8 x 4.8 cm. There is soft tissue prominence in the region of the rectum. No evidence of perforation or extraluminal air. There is a small amount of simple free fluid in the pelvis. Ovaries are tentatively identified and normal in size. Pelvic bowel loops are normal. The appendix is not definitively identified. Bladder is physiologically distended and normal.  Posterior fusion at L5-S1. 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  IMPRESSION: 1. Soft tissue prominence in the region of the cervix which is poorly defined, small amount of intra cervix air seen distally. Additionally there is soft tissue thickening of the rectosigmoid colon. There is no extraluminal air or findings to suggest perforation or abscess. 2. Bulbous appearance of the uterus with probable posterior fundal fibroid. 3. Incidental findings of fat containing umbilical hernia, 1 cm right renal cyst, and avascular necrosis of both femoral heads.   Electronically Signed   By: Rubye Oaks M.D.   On: 10/08/2014 00:18   Dg Chest Portable 1 View  10/07/2014   CLINICAL DATA:  Severe shortness of breath and chest pain, sudden onset today. Umbilical hernia. Nonsmoker.  EXAM: PORTABLE CHEST - 1 VIEW  COMPARISON:  08/28/2014  FINDINGS: Interval removal of endotracheal tube. The heart size and mediastinal contours are within normal limits. Both lungs are clear. The visualized skeletal structures are unremarkable.  IMPRESSION: No active disease.   Electronically Signed   By: Burman Nieves M.D.   On: 10/07/2014 21:33   Dg Abd Portable 1v  10/07/2014   CLINICAL DATA:  Abdominal pain.  Known umbilical hernia.  EXAM: PORTABLE ABDOMEN - 1 VIEW  COMPARISON:  Abdominal radiograph  07/31/2014  FINDINGS: There is no free intra-abdominal air identified on supine single-view. No dilated bowel loops to suggest obstruction. Small volume of stool throughout the colon. No radiopaque calculi. Postsurgical change at the lumbosacral junction. No acute osseous abnormalities are seen.  IMPRESSION: Normal bowel gas pattern.   Electronically Signed   By: Rubye Oaks M.D.   On: 10/07/2014 21:35    EKG: Independently reviewed. Normal  sinus rhythm.  Assessment/Plan Active Problems:   Vocal cord dysfunction   SOB (shortness of breath)   Shortness of breath   Rectal bleeding   1. Shortness of breath secondary to vocal cord dysfunction - appreciate [facility] care consult. Continue with nebulizer Pulmicort and steroids. Closely observe the patient was recently intubated. 2. Rectal bleeding with use of sexual toy - patient's CAT scan does show rectosigmoid thickening. There is no obvious perforation. There is some area inside the cervix. I did discuss with on-call gynecologist Dr. Adrian Blackwater who at this time advised that the air inside the cervix is not significant and they will arrange for follow-up as outpatient. Patient is being placed empirically on antibiotics for now. And continue with pain relief medications. Follow CBC. 3. Gardrenella vaginalis - patient is on Flagyl. 4. Anemia - follow CBC.   DVT Prophylaxis SCDs.  Code Status: Full code.  Family Communication: None.  Disposition Plan: Admit to inpatient.    KAKRAKANDY,ARSHAD N. [facility] Pager [phone].  If 7PM-7AM, please contact night-coverage [email or web address] Password [facility] 10/08/2014, 2:56 AM

## 2014-10-08 NOTE — Discharge Instructions (Signed)
Rectal Bleeding °Rectal bleeding is when blood passes out of the anus. It is usually a sign that something is wrong. It may not be serious, but it should always be evaluated. Rectal bleeding may present as bright red blood or extremely dark stools. The color may range from dark red or maroon to black (like tar). It is important that the cause of rectal bleeding be identified so treatment can be started and the problem corrected. °CAUSES  °· Hemorrhoids. These are enlarged (dilated) blood vessels or veins in the anal or rectal area. °· Fistulas. These are abnormal, burrowing channels that usually run from inside the rectum to the skin around the anus. They can bleed. °· Anal fissures. This is a tear in the tissue of the anus. Bleeding occurs with bowel movements. °· Diverticulosis. This is a condition in which pockets or sacs project from the bowel wall. Occasionally, the sacs can bleed. °· Diverticulitis. This is an infection involving diverticulosis of the colon. °· Proctitis and colitis. These are conditions in which the rectum, colon, or both, can become inflamed and pitted (ulcerated). °· Polyps and cancer. Polyps are non-cancerous (benign) growths in the colon that may bleed. Certain types of polyps turn into cancer. °· Protrusion of the rectum. Part of the rectum can project from the anus and bleed. °· Certain medicines. °· Intestinal infections. °· Blood vessel abnormalities. °HOME CARE INSTRUCTIONS °· Eat a high-fiber diet to keep your stool soft. °· Limit activity. °· Drink enough fluids to keep your urine clear or pale yellow. °· Warm baths may be useful to soothe rectal pain. °· Follow up with your caregiver as directed. °SEEK IMMEDIATE MEDICAL CARE IF: °· You develop increased bleeding. °· You have black or dark red stools. °· You vomit blood or material that looks like coffee grounds. °· You have abdominal pain or tenderness. °· You have a fever. °· You feel weak, nauseous, or you faint. °· You have  severe rectal pain or you are unable to have a bowel movement. °MAKE SURE YOU: °· Understand these instructions. °· Will watch your condition. °· Will get help right away if you are not doing well or get worse. °Document Released: 01/21/2002 Document Revised: 10/24/2011 Document Reviewed: 01/16/2011 °Exit[employer]® Patient Information ©2015 [employer]. This information is not intended to replace advice given to you by your health care provider. Make sure you discuss any questions you have with your health care provider. ° °
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
## 2014-10-09 ENCOUNTER — Telehealth: Payer: Self-pay | Admitting: Internal Medicine

## 2014-10-09 LAB — RPR: RPR Ser Ql: NONREACTIVE

## 2014-10-09 LAB — HIV ANTIBODY (ROUTINE TESTING W REFLEX): HIV Screen 4th Generation wRfx: NONREACTIVE

## 2014-10-09 NOTE — Telephone Encounter (Signed)
Amy [phone] ext [phone]

## 2014-10-09 NOTE — Telephone Encounter (Signed)
lmtcb for Julie Kerr 

## 2014-10-09 NOTE — Telephone Encounter (Signed)
[facility] X[facility]

## 2014-10-10 LAB — URINE CULTURE: Colony Count: >=100000

## 2014-10-10 NOTE — Telephone Encounter (Signed)
Received call from Amy. Amy stated she is needing pt's plan of care with the start date of 08/07/2014. Form has not been scanned in.  Informed Amy to fax the form to triage and will have MW sign and we can fax back.   Will forward to [place] to look out for form.

## 2014-10-10 NOTE — Telephone Encounter (Signed)
lmtcb for Julie Kerr 

## 2014-10-10 NOTE — Telephone Encounter (Signed)
This CMN was placed in MW's lookat

## 2014-10-13 NOTE — Telephone Encounter (Signed)
done

## 2014-10-13 NOTE — Telephone Encounter (Signed)
Noted Will sign off 

## 2014-10-22 ENCOUNTER — Encounter: Payer: Medicaid Other | Admitting: Obstetrics & Gynecology

## 2014-10-29 ENCOUNTER — Emergency Department (HOSPITAL_COMMUNITY): Payer: Medicaid Other

## 2014-10-29 ENCOUNTER — Encounter (HOSPITAL_COMMUNITY): Payer: Self-pay | Admitting: Family Medicine

## 2014-10-29 ENCOUNTER — Inpatient Hospital Stay (HOSPITAL_COMMUNITY): Payer: Medicaid Other

## 2014-10-29 ENCOUNTER — Other Ambulatory Visit (HOSPITAL_COMMUNITY): Payer: Self-pay

## 2014-10-29 ENCOUNTER — Inpatient Hospital Stay (HOSPITAL_COMMUNITY)
Admission: EM | Admit: 2014-10-29 | Discharge: 2014-10-31 | DRG: 202 | Disposition: A | Discharge status: Home / Self Care | Payer: Medicaid Other | Attending: Pulmonary Disease | Admitting: Pulmonary Disease

## 2014-10-29 DIAGNOSIS — G8929 Other chronic pain: Secondary | ICD-10-CM | POA: Diagnosis present

## 2014-10-29 DIAGNOSIS — Z79899 Other long term (current) drug therapy: Secondary | ICD-10-CM

## 2014-10-29 DIAGNOSIS — K219 Gastro-esophageal reflux disease without esophagitis: Secondary | ICD-10-CM | POA: Diagnosis present

## 2014-10-29 DIAGNOSIS — Z87891 Personal history of nicotine dependence: Secondary | ICD-10-CM

## 2014-10-29 DIAGNOSIS — F1410 Cocaine abuse, uncomplicated: Secondary | ICD-10-CM | POA: Diagnosis present

## 2014-10-29 DIAGNOSIS — I10 Essential (primary) hypertension: Secondary | ICD-10-CM | POA: Diagnosis present

## 2014-10-29 DIAGNOSIS — J383 Other diseases of vocal cords: Secondary | ICD-10-CM

## 2014-10-29 DIAGNOSIS — E119 Type 2 diabetes mellitus without complications: Secondary | ICD-10-CM | POA: Diagnosis present

## 2014-10-29 DIAGNOSIS — R061 Stridor: Secondary | ICD-10-CM

## 2014-10-29 DIAGNOSIS — Z981 Arthrodesis status: Secondary | ICD-10-CM

## 2014-10-29 DIAGNOSIS — F419 Anxiety disorder, unspecified: Secondary | ICD-10-CM | POA: Diagnosis present

## 2014-10-29 DIAGNOSIS — J9601 Acute respiratory failure with hypoxia: Secondary | ICD-10-CM

## 2014-10-29 DIAGNOSIS — J45901 Unspecified asthma with (acute) exacerbation: Secondary | ICD-10-CM | POA: Diagnosis not present

## 2014-10-29 DIAGNOSIS — J449 Chronic obstructive pulmonary disease, unspecified: Secondary | ICD-10-CM | POA: Diagnosis present

## 2014-10-29 DIAGNOSIS — R0600 Dyspnea, unspecified: Secondary | ICD-10-CM | POA: Diagnosis not present

## 2014-10-29 DIAGNOSIS — J069 Acute upper respiratory infection, unspecified: Secondary | ICD-10-CM | POA: Diagnosis present

## 2014-10-29 DIAGNOSIS — J9600 Acute respiratory failure, unspecified whether with hypoxia or hypercapnia: Secondary | ICD-10-CM | POA: Diagnosis present

## 2014-10-29 LAB — CBC
HEMATOCRIT: 37.5 % (ref 36.0–46.0)
HEMOGLOBIN: 11.8 g/dL — AB (ref 12.0–15.0)
MCH: 24.5 pg — ABNORMAL LOW (ref 26.0–34.0)
MCHC: 31.5 g/dL (ref 30.0–36.0)
MCV: 78 fL (ref 78.0–100.0)
Platelets: 397 10*3/uL (ref 150–400)
RBC: 4.81 MIL/uL (ref 3.87–5.11)
RDW: 15.8 % — ABNORMAL HIGH (ref 11.5–15.5)
WBC: 6.4 10*3/uL (ref 4.0–10.5)

## 2014-10-29 LAB — COMPREHENSIVE METABOLIC PANEL
ALBUMIN: 3.4 g/dL — AB (ref 3.5–5.2)
ALK PHOS: 61 U/L (ref 39–117)
ALT: 13 U/L (ref 0–35)
ANION GAP: 10 (ref 5–15)
AST: 20 U/L (ref 0–37)
BILIRUBIN TOTAL: 0.4 mg/dL (ref 0.3–1.2)
BUN: 8 mg/dL (ref 6–23)
CO2: 23 mmol/L (ref 19–32)
Calcium: 9.2 mg/dL (ref 8.4–10.5)
Chloride: 106 mmol/L (ref 96–112)
Creatinine, Ser: 0.96 mg/dL (ref 0.50–1.10)
GFR calc Af Amer: 87 mL/min — ABNORMAL LOW (ref >90)
GFR calc non Af Amer: 75 mL/min — ABNORMAL LOW (ref >90)
Glucose, Bld: 158 mg/dL — ABNORMAL HIGH (ref 70–99)
POTASSIUM: 2.9 mmol/L — AB (ref 3.5–5.1)
Sodium: 139 mmol/L (ref 135–145)
TOTAL PROTEIN: 6.3 g/dL (ref 6.0–8.3)

## 2014-10-29 LAB — CBC WITH DIFFERENTIAL/PLATELET
BASOS ABS: 0 10*3/uL (ref 0.0–0.1)
BASOS PCT: 0 % (ref 0–1)
EOS ABS: 0.1 10*3/uL (ref 0.0–0.7)
Eosinophils Relative: 1 % (ref 0–5)
HCT: 33.3 % — ABNORMAL LOW (ref 36.0–46.0)
HEMOGLOBIN: 10.8 g/dL — AB (ref 12.0–15.0)
LYMPHS ABS: 1.1 10*3/uL (ref 0.7–4.0)
LYMPHS PCT: 12 % (ref 12–46)
MCH: 25.5 pg — ABNORMAL LOW (ref 26.0–34.0)
MCHC: 32.4 g/dL (ref 30.0–36.0)
MCV: 78.5 fL (ref 78.0–100.0)
MONOS PCT: 2 % — AB (ref 3–12)
Monocytes Absolute: 0.2 10*3/uL (ref 0.1–1.0)
NEUTROS PCT: 85 % — AB (ref 43–77)
Neutro Abs: 7.8 10*3/uL — ABNORMAL HIGH (ref 1.7–7.7)
Platelets: 307 10*3/uL (ref 150–400)
RBC: 4.24 MIL/uL (ref 3.87–5.11)
RDW: 15.7 % — ABNORMAL HIGH (ref 11.5–15.5)
WBC: 9.1 10*3/uL (ref 4.0–10.5)

## 2014-10-29 LAB — I-STAT ARTERIAL BLOOD GAS, ED
Acid-base deficit: 1 mmol/L (ref 0.0–2.0)
Bicarbonate: 23.6 mEq/L (ref 20.0–24.0)
O2 Saturation: 100 %
PCO2 ART: 38.2 mmHg (ref 35.0–45.0)
PH ART: 7.399 (ref 7.350–7.450)
PO2 ART: 179 mmHg — AB (ref 80.0–100.0)
TCO2: 25 mmol/L (ref 0–100)

## 2014-10-29 LAB — BASIC METABOLIC PANEL
Anion gap: 10 (ref 5–15)
BUN: 7 mg/dL (ref 6–23)
CO2: 25 mmol/L (ref 19–32)
Calcium: 10.2 mg/dL (ref 8.4–10.5)
Chloride: 104 mmol/L (ref 96–112)
Creatinine, Ser: 0.86 mg/dL (ref 0.50–1.10)
GFR calc Af Amer: >90 mL/min (ref >90)
GFR calc non Af Amer: 86 mL/min — ABNORMAL LOW (ref >90)
Glucose, Bld: 98 mg/dL (ref 70–99)
POTASSIUM: 4.3 mmol/L (ref 3.5–5.1)
Sodium: 139 mmol/L (ref 135–145)

## 2014-10-29 LAB — PROTIME-INR
INR: 1.17 (ref 0.00–1.49)
PROTHROMBIN TIME: 15 s (ref 11.6–15.2)

## 2014-10-29 LAB — APTT: aPTT: 25 seconds (ref 24–37)

## 2014-10-29 LAB — GLUCOSE, CAPILLARY
Glucose-Capillary: 161 mg/dL — ABNORMAL HIGH (ref 70–99)
Glucose-Capillary: 169 mg/dL — ABNORMAL HIGH (ref 70–99)

## 2014-10-29 MED ORDER — IPRATROPIUM BROMIDE 0.02 % IN SOLN
0.5000 mg | Freq: Once | RESPIRATORY_TRACT | Status: AC
  Administered 2014-10-29: 0.5 mg via RESPIRATORY_TRACT
  Filled 2014-10-29: qty 2.5

## 2014-10-29 MED ORDER — ALBUTEROL SULFATE HFA 108 (90 BASE) MCG/ACT IN AERS: 8.0000 | INHALATION_SPRAY | RESPIRATORY_TRACT | Status: DC | PRN

## 2014-10-29 MED ORDER — IPRATROPIUM-ALBUTEROL 0.5-2.5 (3) MG/3ML IN SOLN
3.0000 mL | Freq: Once | RESPIRATORY_TRACT | Status: AC
  Administered 2014-10-29: 3 mL via RESPIRATORY_TRACT
  Filled 2014-10-29: qty 3

## 2014-10-29 MED ORDER — INSULIN ASPART 100 UNIT/ML ~~LOC~~ SOLN
2.0000 [IU] | SUBCUTANEOUS | Status: DC
  Administered 2014-10-29 – 2014-10-30 (×2): 4 [IU] via SUBCUTANEOUS

## 2014-10-29 MED ORDER — METHYLPREDNISOLONE SODIUM SUCC 125 MG IJ SOLR: 60.0000 mg | Freq: Four times a day (QID) | INTRAMUSCULAR | Status: DC

## 2014-10-29 MED ORDER — SODIUM CHLORIDE 0.9 % IV BOLUS (SEPSIS)
1000.0000 mL | Freq: Once | INTRAVENOUS | Status: AC
  Administered 2014-10-29: 1000 mL via INTRAVENOUS

## 2014-10-29 MED ORDER — SODIUM CHLORIDE 0.9 % IV SOLN
INTRAVENOUS | Status: DC
  Administered 2014-10-29: 19:00:00 via INTRAVENOUS

## 2014-10-29 MED ORDER — FENTANYL CITRATE 0.05 MG/ML IJ SOLN
25.0000 ug | INTRAMUSCULAR | Status: DC | PRN
  Administered 2014-10-29 – 2014-10-30 (×6): 50 ug via INTRAVENOUS
  Filled 2014-10-29 (×6): qty 2

## 2014-10-29 MED ORDER — ALBUTEROL SULFATE (2.5 MG/3ML) 0.083% IN NEBU
2.5000 mg | INHALATION_SOLUTION | Freq: Once | RESPIRATORY_TRACT | Status: AC
  Administered 2014-10-29: 2.5 mg via RESPIRATORY_TRACT
  Filled 2014-10-29: qty 3

## 2014-10-29 MED ORDER — ALBUTEROL (5 MG/ML) CONTINUOUS INHALATION SOLN
10.0000 mg/h | INHALATION_SOLUTION | Freq: Once | RESPIRATORY_TRACT | Status: AC
  Administered 2014-10-29: 10 mg/h via RESPIRATORY_TRACT
  Filled 2014-10-29: qty 20

## 2014-10-29 MED ORDER — IPRATROPIUM-ALBUTEROL 0.5-2.5 (3) MG/3ML IN SOLN
3.0000 mL | Freq: Four times a day (QID) | RESPIRATORY_TRACT | Status: DC
  Administered 2014-10-29 – 2014-10-30 (×3): 3 mL via RESPIRATORY_TRACT
  Filled 2014-10-29 (×3): qty 3

## 2014-10-29 MED ORDER — PANTOPRAZOLE SODIUM 40 MG IV SOLR
40.0000 mg | INTRAVENOUS | Status: DC
  Administered 2014-10-29: 40 mg via INTRAVENOUS
  Filled 2014-10-29 (×2): qty 40

## 2014-10-29 MED ORDER — POTASSIUM CHLORIDE CRYS ER 20 MEQ PO TBCR
60.0000 meq | EXTENDED_RELEASE_TABLET | Freq: Once | ORAL | Status: AC
  Administered 2014-10-29: 60 meq via ORAL
  Filled 2014-10-29: qty 3

## 2014-10-29 MED ORDER — METHYLPREDNISOLONE SODIUM SUCC 125 MG IJ SOLR
60.0000 mg | Freq: Four times a day (QID) | INTRAMUSCULAR | Status: DC
  Administered 2014-10-29 – 2014-10-30 (×3): 60 mg via INTRAVENOUS
  Filled 2014-10-29: qty 0.96
  Filled 2014-10-29: qty 2
  Filled 2014-10-29: qty 0.96
  Filled 2014-10-29: qty 2
  Filled 2014-10-29 (×2): qty 0.96

## 2014-10-29 MED ORDER — ALBUTEROL SULFATE (2.5 MG/3ML) 0.083% IN NEBU: 5.0000 mg | INHALATION_SOLUTION | Freq: Once | RESPIRATORY_TRACT | Status: DC

## 2014-10-29 MED ORDER — HEPARIN SODIUM (PORCINE) 5000 UNIT/ML IJ SOLN
5000.0000 [IU] | Freq: Three times a day (TID) | INTRAMUSCULAR | Status: DC
  Administered 2014-10-29 – 2014-10-30 (×3): 5000 [IU] via SUBCUTANEOUS
  Filled 2014-10-29 (×7): qty 1

## 2014-10-29 MED ORDER — LORAZEPAM 2 MG/ML IJ SOLN
1.0000 mg | Freq: Once | INTRAMUSCULAR | Status: AC
Start: 2014-10-29 — End: 2014-10-29
  Administered 2014-10-29: 1 mg via INTRAVENOUS
  Filled 2014-10-29: qty 1

## 2014-10-29 MED ORDER — LORAZEPAM 2 MG/ML IJ SOLN
0.5000 mg | INTRAMUSCULAR | Status: DC | PRN
  Administered 2014-10-29 – 2014-10-30 (×4): 1 mg via INTRAVENOUS
  Filled 2014-10-29 (×3): qty 1

## 2014-10-29 MED ORDER — METHYLPREDNISOLONE SODIUM SUCC 125 MG IJ SOLR
125.0000 mg | Freq: Once | INTRAMUSCULAR | Status: AC
  Administered 2014-10-29: 125 mg via INTRAVENOUS
  Filled 2014-10-29: qty 2

## 2014-10-29 MED ORDER — MAGNESIUM SULFATE 2 GM/50ML IV SOLN
2.0000 g | Freq: Once | INTRAVENOUS | Status: AC
Start: 2014-10-29 — End: 2014-10-29
  Administered 2014-10-29: 2 g via INTRAVENOUS
  Filled 2014-10-29: qty 50

## 2014-10-29 MED ORDER — ALBUTEROL SULFATE (2.5 MG/3ML) 0.083% IN NEBU
5.0000 mg | INHALATION_SOLUTION | RESPIRATORY_TRACT | Status: DC | PRN
  Administered 2014-10-29: 5 mg via RESPIRATORY_TRACT
  Filled 2014-10-29: qty 6

## 2014-10-29 NOTE — ED Notes (Signed)
Intensivists at bedside.
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
## 2014-10-29 NOTE — H&P (Signed)
PULMONARY / CRITICAL CARE MEDICINE   Name: Julie Kerr MRN: [id number] DOB: 10/16/1975    ADMISSION DATE:  10/29/2014  REFERRING MD :  EDP  CHIEF COMPLAINT:  Respiratory failure   INITIAL PRESENTATION: 39 yo female with hx anxiety and severe VCD, recent admit in December for acute respiratory failure requiring intubation and ultimately tracheostomy (decannulated approx 1 month later).  Presented 3/16 with 2 day hx fever, progressive SOB, cough.  In ER with significant respiratory distress although sats, ABG completely normal.  PCCM called to admit.   STUDIES:    SIGNIFICANT EVENTS:    HISTORY OF PRESENT ILLNESS:  39 yo female with hx anxiety and severe VCD, recent admit in December for acute respiratory failure requiring intubation and ultimately tracheostomy (decannulated approx 1 month later).  Presented 3/16 with 2 day hx fever, progressive SOB, cough.  In ER with significant respiratory distress although sats, ABG completely normal.  PCCM called to admit.  Pt c/o fever, back pain, non productive cough, worsening SOB.  Difficulty speaking r/t hoarse voice.  Denies hemoptysis, chest pain, purulent sputum, n/v/d.   PAST MEDICAL HISTORY :   has a past medical history of Asthma; Anxiety; COPD (chronic obstructive pulmonary disease); Bronchitis; Heart murmur; Shortness of breath; Diabetes mellitus without complication; and Hypertension.  has past surgical history that includes Spinal fusion and Tracheostomy (December 2015). Prior to Admission medications   Medication Sig Start Date End Date Taking? Authorizing Provider  albuterol (PROVENTIL) (2.5 MG/3ML) 0.083% nebulizer solution Take 3 mLs by nebulization every 2 (two) hours as needed for wheezing or shortness of breath.  08/21/14   Historical Provider, MD  ALPRAZolam Prudy Feeler(XANAX) 1 MG tablet Take 1 tablet (1 mg total) by mouth 3 (three) times daily as needed for anxiety. 09/01/14   Vassie Lollarlos Madera, MD  budesonide (PULMICORT) 0.25 MG/2ML nebulizer  solution Take 2 mLs (0.25 mg total) by nebulization 2 (two) times daily. 09/01/14   Vassie Lollarlos Madera, MD  chlorpheniramine-HYDROcodone (TUSSIONEX) 10-8 MG/5ML LQCR Take 5 mLs by mouth every 12 (twelve) hours as needed for cough. 09/01/14   Vassie Lollarlos Madera, MD  ciprofloxacin (CIPRO) 500 MG tablet Take 1 tablet (500 mg total) by mouth 2 (two) times daily. 10/08/14   Lonia BloodJeffrey T McClung, MD  diphenhydrAMINE (BENADRYL) 25 MG tablet Take 25-50 mg by mouth every 6 (six) hours as needed for itching.    Historical Provider, MD  ipratropium-albuterol (DUONEB) 0.5-2.5 (3) MG/3ML SOLN Take 3 mLs by nebulization every 6 (six) hours as needed (SOB, wheezing). 09/01/14   Vassie Lollarlos Madera, MD  metroNIDAZOLE (FLAGYL) 500 MG tablet Take 1 tablet (500 mg total) by mouth every 12 (twelve) hours. 10/08/14   Lonia BloodJeffrey T McClung, MD  morphine (MS CONTIN) 30 MG 12 hr tablet Take 1 tablet (30 mg total) by mouth every 12 (twelve) hours. 09/01/14   Vassie Lollarlos Madera, MD  nicotine (NICODERM CQ - DOSED IN MG/24 HOURS) 21 mg/24hr patch Place 1 patch (21 mg total) onto the skin daily. 06/01/14   Kathlen ModyVijaya Akula, MD  oxyCODONE (ROXICODONE) 15 MG immediate release tablet Take 1 tablet (15 mg total) by mouth every 6 (six) hours as needed for moderate pain or breakthrough pain. 09/01/14   Vassie Lollarlos Madera, MD  pantoprazole (PROTONIX) 40 MG tablet Take 1 tablet (40 mg total) by mouth daily at 12 noon. 09/01/14   Vassie Lollarlos Madera, MD  predniSONE (DELTASONE) 10 MG tablet Take 4 tablets 2x a day for 2 days, then 2 tablets 2x a day for 2 days, then 1  tablet 2x a day for 2 days, then 1 tablet a day for 2 days, then stop 10/08/14   Lonia Blood, MD  pregabalin (LYRICA) 25 MG capsule Take 1 capsule (25 mg total) by mouth 2 (two) times daily. 09/01/14   Vassie Loll, MD  tiotropium (SPIRIVA) 18 MCG inhalation capsule Place 18 mcg into inhaler and inhale daily.    Historical Provider, MD  Water For Irrigation, Sterile (STERILE WATER FOR IRRIGATION) Irrigate with 1,000 mLs as  directed once. Patient taking differently: Irrigate with 1,000 mLs as directed daily.  08/06/14   Duayne Cal, NP  zolpidem (AMBIEN) 5 MG tablet Take 1 tablet (5 mg total) by mouth at bedtime. 09/01/14   Vassie Loll, MD   Allergies  Allergen Reactions  . Robitussin Dm [Dextromethorphan-Guaifenesin] Nausea And Vomiting  . Nsaids Hives  . Rayon, Purified Hives  . Tramadol Hives    FAMILY HISTORY:  indicated that her mother is deceased. She indicated that her father is deceased. She indicated that her other is deceased.  SOCIAL HISTORY:  reports that she quit smoking about 4 months ago. Her smoking use included Cigarettes. She has a 34 pack-year smoking history. She has never used smokeless tobacco. She reports that she drinks alcohol. She reports that she does not use illicit drugs.  REVIEW OF SYSTEMS:  As per HPI - All other systems reviewed and were neg.     VITAL SIGNS: Temp:  [98.4 F (36.9 C)] 98.4 F (36.9 C) (03/16 1524) Pulse Rate:  [95-109] 107 (03/16 1630) Resp:  [18-31] 28 (03/16 1630) BP: (83-203)/(59-136) 101/62 mmHg (03/16 1630) SpO2:  [94 %-100 %] 100 % (03/16 1630) HEMODYNAMICS:   VENTILATOR SETTINGS:   INTAKE / OUTPUT: No intake or output data in the 24 hours ending 10/29/14 1657  PHYSICAL EXAMINATION: General:  Pleasant female, mild resp distress in ER  Neuro:  Awake, alert, follows commands, anxious appearing  HEENT:  Mm dry, hoarse/weak voice, trach stoma  Cardiovascular:  s1s2 rrr Lungs:  resps mildly labored, grunting, good bilat air movement, scattered rhonchi, LOTS of upper airway noise/wheeze  Abdomen:  Soft, +bs Musculoskeletal:  Warm and dry, no edema   LABS:  CBC  Recent Labs Lab 10/29/14 1543  WBC 6.4  HGB 11.8*  HCT 37.5  PLT 397   Coag's No results for input(s): APTT, INR in the last 168 hours. BMET No results for input(s): NA, K, CL, CO2, BUN, CREATININE, GLUCOSE in the last 168 hours. Electrolytes No results for  input(s): CALCIUM, MG, PHOS in the last 168 hours. Sepsis Markers No results for input(s): LATICACIDVEN, PROCALCITON, O2SATVEN in the last 168 hours. ABG  Recent Labs Lab 10/29/14 1637  PHART 7.399  PCO2ART 38.2  PO2ART 179.0*   Liver Enzymes No results for input(s): AST, ALT, ALKPHOS, BILITOT, ALBUMIN in the last 168 hours. Cardiac Enzymes No results for input(s): TROPONINI, PROBNP in the last 168 hours. Glucose No results for input(s): GLUCAP in the last 168 hours.  Imaging No results found.   ASSESSMENT / PLAN:  Acute hypoxic respiratory failure - R/t severe VCD, anxiety +/- viral URI.  Severe VCD - previous trach  "asthma"  PLAN -  IV steroids  BD's - duonebs Supplemental O2 as needed  Anxiolytics  F/u CXR  Check Flu PCR  Hold on abx for now  Hold further ABG's  If requires intubation this admit, would trach and NOT decannulate  ENT to see  Check UDS    DM  PLAN -  SSI   Hx HTN  PLAN -  Monitor  On no maint rx at home     TODAY'S SUMMARY:  Admit ICU.  ENT to see.  Manage anxiety, give BD's, steroids.      Dirk Dress, NP 10/29/2014  4:57 PM Pager: [phone] or [phone]  Acute respiratory failure with upper airway obstruction, severe stridor.  High risk for intubation.  ENT has been called.  Start steroids, no role for racemic epi.  No need for abx.  Duonebs and albuterol as ordered.  Will hold in the ICU for observation.  The patient is critically ill with multiple organ systems failure and requires high complexity decision making for assessment and support, frequent evaluation and titration of therapies, application of advanced monitoring technologies and extensive interpretation of multiple databases.   Critical Care Time devoted to patient care services described in this note is  35  Minutes. This time reflects time of care of this signee Dr Koren Bound. This critical care time does not reflect procedure time, or teaching time or  supervisory time of PA/NP/Med student/Med Resident etc but could involve care discussion time.  Alyson Reedy, M.D. [facility] Pulmonary/Critical Care Medicine. Pager: [phone]. After hours pager: [phone].

## 2014-10-29 NOTE — Progress Notes (Signed)
eLink Physician-Brief Progress Note Patient Name: Julie Kerr DOB: 08/22/1975 MRN: [id number]   Date of Service  10/29/2014  HPI/Events of Note    eICU Interventions  Hypokalemia, repleted      Intervention Category Minor Interventions: Electrolytes abnormality - evaluation and management  Julie Kerr 10/29/2014, 7:32 PM

## 2014-10-29 NOTE — ED Provider Notes (Signed)
CSN: [id number]     Arrival date & time 10/29/14  1522 History   First MD Initiated Contact with Patient 10/29/14 1527     Chief Complaint  Patient presents with  . Asthma     (Consider location/radiation/quality/duration/timing/severity/associated sxs/prior Treatment) HPI  A LEVEL 5 CAVEAT PERTAINS DUE TO URGENT NEED FOR INTERVENTION Pt with hx of asthma, vocal cord dysfunction and anxiety presenting with wheezing and shortness of breath.  She is able to speak but very anxious and not able to provide much further history. She states she used 5 breathing treatments at home without much relief.    Past Medical History  Diagnosis Date  . Asthma   . Anxiety   . COPD (chronic obstructive pulmonary disease)   . Bronchitis   . Heart murmur   . Shortness of breath   . Diabetes mellitus without complication   . Hypertension    Past Surgical History  Procedure Laterality Date  . Spinal fusion    . Tracheostomy  December 2015   Family History  Problem Relation Age of Onset  . HIV Mother   . Heart disease Father   . CVA Father   . Heart disease Other   . Emphysema Maternal Grandmother     smoked  . Asthma Sister   . Clotting disorder Sister   . Clotting disorder Maternal Grandmother   . Lung cancer Maternal Grandmother     smoked   History  Substance Use Topics  . Smoking status: Former Smoker -- 2.00 packs/day for 17 years    Types: Cigarettes    Quit date: 06/15/2014  . Smokeless tobacco: Never Used  . Alcohol Use: 0.0 oz/week    0 Standard drinks or equivalent per week     Comment: occasional   OB History    No data available     Review of Systems  UNABLE TO OBTAIN ROS DUE TO LEVEL 5 CAVEAT    Allergies  Robitussin dm; Nsaids; Rayon, purified; and Tramadol  Home Medications   Prior to Admission medications   Medication Sig Start Date End Date Taking? Authorizing Provider  albuterol (PROVENTIL) (2.5 MG/3ML) 0.083% nebulizer solution Take 3 mLs by  nebulization every 2 (two) hours as needed for wheezing or shortness of breath.  08/21/14  Yes Historical Provider, MD  ALPRAZolam Prudy Feeler) 1 MG tablet Take 1 tablet (1 mg total) by mouth 3 (three) times daily as needed for anxiety. 09/01/14  Yes Vassie Loll, MD  ENSURE (ENSURE) Take 237 mLs by mouth 3 (three) times daily between meals.   Yes Historical Provider, MD  morphine (MS CONTIN) 30 MG 12 hr tablet Take 1 tablet (30 mg total) by mouth every 12 (twelve) hours. Patient taking differently: Take 30 mg by mouth every 8 (eight) hours.  09/01/14  Yes Vassie Loll, MD  oxyCODONE (ROXICODONE) 15 MG immediate release tablet Take 1 tablet (15 mg total) by mouth every 6 (six) hours as needed for moderate pain or breakthrough pain. Patient taking differently: Take 30 mg by mouth every 6 (six) hours as needed for pain.  09/01/14  Yes Vassie Loll, MD  pantoprazole (PROTONIX) 40 MG tablet Take 1 tablet (40 mg total) by mouth daily at 12 noon. 09/01/14  Yes Vassie Loll, MD  pregabalin (LYRICA) 25 MG capsule Take 1 capsule (25 mg total) by mouth 2 (two) times daily. 09/01/14  Yes Vassie Loll, MD  tiotropium (SPIRIVA) 18 MCG inhalation capsule Place 18 mcg into inhaler and inhale daily.  Yes Historical Provider, MD  zolpidem (AMBIEN) 5 MG tablet Take 1 tablet (5 mg total) by mouth at bedtime. 09/01/14  Yes Vassie Loll, MD  arformoterol (BROVANA) 15 MCG/2ML NEBU Take 2 mLs (15 mcg total) by nebulization 2 (two) times daily. 10/31/14   Simonne Martinet, NP  azelastine (ASTELIN) 0.1 % nasal spray Place 2 sprays into both nostrils 2 (two) times daily. Use in each nostril as directed 10/31/14   Simonne Martinet, NP  budesonide (PULMICORT) 0.5 MG/2ML nebulizer solution Take 2 mLs (0.5 mg total) by nebulization 2 (two) times daily. 10/31/14   Simonne Martinet, NP  fluticasone (FLONASE) 50 MCG/ACT nasal spray Place 2 sprays into both nostrils 2 (two) times daily. 10/31/14   Simonne Martinet, NP  predniSONE (DELTASONE) 10 MG  tablet Take 6 tabs daily x1d, then 5 tabs daily x 1d, then 4 tabs daily x 1d, then 3 tabs daily x 1d, then 2 tabs daily x 1d, then 1 tab daily and stop 10/31/14   Simonne Martinet, NP  promethazine-codeine (PHENERGAN WITH CODEINE) 6.25-10 MG/5ML syrup Take 5 mLs by mouth every 6 (six) hours as needed for cough. 10/31/14   Simonne Martinet, NP   BP 110/70 mmHg  Pulse 101  Temp(Src) 98.7 F (37.1 C) (Oral)  Resp 16  Ht  (1.575 m)  Wt 178 lb 12.7 oz (81.1 kg)  BMI 32.69 kg/m2  SpO2 100%  LMP 10/25/2014  Vitals reviewed Physical Exam  Physical Examination: General appearance - alert, ill and axious appearing, and in moderate distress Mental status - alert, oriented to person, place, and time Eyes - no conjunctival injection, no scleral icterus Mouth - mucous membranes moist, pharynx normal without lesions Neck - no significant LAD, no JVD Chest - BSS, increased respiratory effort with audible wheezes throughout, also stridorous Heart - normal rate, regular rhythm, normal S1, S2, no murmurs, rubs, clicks or gallops Extremities - peripheral pulses normal, no pedal edema, no clubbing or cyanosis Skin - normal coloration and turgor, no rashes Psych- anxous  ED Course  Procedures (including critical care time)  CRITICAL CARE Performed by: Ethelda Chick Total critical care time: 40 Critical care time was exclusive of separately billable procedures and treating other patients. Critical care was necessary to treat or prevent imminent or life-threatening deterioration. Critical care was time spent personally by me on the following activities: development of treatment plan with patient and/or surrogate as well as nursing, discussions with consultants, evaluation of patient's response to treatment, examination of patient, obtaining history from patient or surrogate, ordering and performing treatments and interventions, ordering and review of laboratory studies, ordering and review of  radiographic studies, pulse oximetry and re-evaluation of patient's condition.  4:27 PM d/w Dr. Kendrick Fries, PCCM. He will see patient in the ED.    4:39 PM dr. Molli Knock has seen patient, he requests that I call ENT to request laryngoscopy, paging now.    5:02 PM PCCM is admitting patient.  Labs Review Labs Reviewed  CBC - Abnormal; Notable for the following:    Hemoglobin 11.8 (*)    MCH 24.5 (*)    RDW 15.8 (*)    All other components within normal limits  BASIC METABOLIC PANEL - Abnormal; Notable for the following:    GFR calc non Af Amer 86 (*)    All other components within normal limits  CBC WITH DIFFERENTIAL/PLATELET - Abnormal; Notable for the following:    Hemoglobin 10.8 (*)    HCT  33.3 (*)    MCH 25.5 (*)    RDW 15.7 (*)    Neutrophils Relative % 85 (*)    Neutro Abs 7.8 (*)    Monocytes Relative 2 (*)    All other components within normal limits  COMPREHENSIVE METABOLIC PANEL - Abnormal; Notable for the following:    Potassium 2.9 (*)    Glucose, Bld 158 (*)    Albumin 3.4 (*)    GFR calc non Af Amer 75 (*)    GFR calc Af Amer 87 (*)    All other components within normal limits  URINE RAPID DRUG SCREEN (HOSP PERFORMED) - Abnormal; Notable for the following:    Cocaine POSITIVE (*)    Benzodiazepines POSITIVE (*)    All other components within normal limits  CBC - Abnormal; Notable for the following:    WBC 11.3 (*)    Hemoglobin 10.1 (*)    HCT 32.6 (*)    MCH 24.9 (*)    RDW 16.0 (*)    All other components within normal limits  BASIC METABOLIC PANEL - Abnormal; Notable for the following:    CO2 13 (*)    Glucose, Bld 155 (*)    GFR calc non Af Amer 65 (*)    GFR calc Af Amer 76 (*)    Anion gap 17 (*)    All other components within normal limits  GLUCOSE, CAPILLARY - Abnormal; Notable for the following:    Glucose-Capillary 161 (*)    All other components within normal limits  GLUCOSE, CAPILLARY - Abnormal; Notable for the following:    Glucose-Capillary  169 (*)    All other components within normal limits  GLUCOSE, CAPILLARY - Abnormal; Notable for the following:    Glucose-Capillary 197 (*)    All other components within normal limits  GLUCOSE, CAPILLARY - Abnormal; Notable for the following:    Glucose-Capillary 158 (*)    All other components within normal limits  GLUCOSE, CAPILLARY - Abnormal; Notable for the following:    Glucose-Capillary 165 (*)    All other components within normal limits  GLUCOSE, CAPILLARY - Abnormal; Notable for the following:    Glucose-Capillary 102 (*)    All other components within normal limits  GLUCOSE, CAPILLARY - Abnormal; Notable for the following:    Glucose-Capillary 130 (*)    All other components within normal limits  BASIC METABOLIC PANEL - Abnormal; Notable for the following:    Glucose, Bld 116 (*)    GFR calc non Af Amer 88 (*)    All other components within normal limits  GLUCOSE, CAPILLARY - Abnormal; Notable for the following:    Glucose-Capillary 113 (*)    All other components within normal limits  GLUCOSE, CAPILLARY - Abnormal; Notable for the following:    Glucose-Capillary 119 (*)    All other components within normal limits  GLUCOSE, CAPILLARY - Abnormal; Notable for the following:    Glucose-Capillary 136 (*)    All other components within normal limits  I-STAT ARTERIAL BLOOD GAS, ED - Abnormal; Notable for the following:    pO2, Arterial 179.0 (*)    All other components within normal limits  APTT  PROTIME-INR  INFLUENZA PANEL BY PCR (TYPE A & B, H1N1)  MAGNESIUM  PHOSPHORUS  HEMOGLOBIN A1C    Imaging Review Dg Chest Portable 1 View  10/29/2014   CLINICAL DATA:  Asthma exacerbation  EXAM: PORTABLE CHEST - 1 VIEW  COMPARISON:  September 06, 2014  FINDINGS: Lungs are clear. Heart size and pulmonary vascularity are normal. No adenopathy. No bone lesions.  IMPRESSION: No edema or consolidation.   Electronically Signed   By: Bretta BangWilliam  Woodruff III M.D.   On: 10/29/2014 16:38    Dg Chest Port 1v Same Day  10/29/2014   CLINICAL DATA:  Progressive shortness of breath/asthma  EXAM: PORTABLE CHEST - 1 VIEW SAME DAY  COMPARISON:  Study obtained earlier in the day  FINDINGS: There is a shallow degree of inspiratory effort. There is no edema or consolidation. Heart size and pulmonary vascularity are normal. No adenopathy. No bone lesions. No pneumothorax.  IMPRESSION: Shallow inspiration.  No edema or consolidation.   Electronically Signed   By: Bretta BangWilliam  Woodruff III M.D.   On: 10/29/2014 17:29     EKG Interpretation None      MDM   Final diagnoses:  Acute respiratory failure  Acute respiratory failure with hypoxia  vocal cord dysfunction anxiety   Pt seen immediately upon being placed in room.  Pt with audible wheezing and stridor with increased respiratory effort.  Pt started on duoneb, then placed on continuous albuterol.  Given steroids and magnesium.  Pt refused bipap and per chart review when this was attempted previously she had panic attack and required emergent intubation.  D/w PCCM who saw patient in the ED.  ENT paged for consult at their request but did not receive call back about this.  ABG normal, PCCM feels this is primarily upper airwar issue.  Pt treated also with ativan for her significant anxiety component.  Pt admitted to ICU.    Jerelyn ScottMartha Linker, MD 10/31/14 [phone]

## 2014-10-29 NOTE — ED Notes (Signed)
Pt here for SOB due to asthma. sts started yesterday and increasingly worse. sts used inhaler without relief. Pt tachypnea and very labored breathing.

## 2014-10-29 NOTE — ED Notes (Signed)
Respiratory contacted for blood gas

## 2014-10-30 DIAGNOSIS — J45901 Unspecified asthma with (acute) exacerbation: Principal | ICD-10-CM

## 2014-10-30 DIAGNOSIS — J9600 Acute respiratory failure, unspecified whether with hypoxia or hypercapnia: Secondary | ICD-10-CM

## 2014-10-30 DIAGNOSIS — E119 Type 2 diabetes mellitus without complications: Secondary | ICD-10-CM

## 2014-10-30 DIAGNOSIS — F419 Anxiety disorder, unspecified: Secondary | ICD-10-CM

## 2014-10-30 LAB — MAGNESIUM: MAGNESIUM: 1.9 mg/dL (ref 1.5–2.5)

## 2014-10-30 LAB — CBC
HCT: 32.6 % — ABNORMAL LOW (ref 36.0–46.0)
Hemoglobin: 10.1 g/dL — ABNORMAL LOW (ref 12.0–15.0)
MCH: 24.9 pg — AB (ref 26.0–34.0)
MCHC: 31 g/dL (ref 30.0–36.0)
MCV: 80.3 fL (ref 78.0–100.0)
Platelets: 296 10*3/uL (ref 150–400)
RBC: 4.06 MIL/uL (ref 3.87–5.11)
RDW: 16 % — AB (ref 11.5–15.5)
WBC: 11.3 10*3/uL — ABNORMAL HIGH (ref 4.0–10.5)

## 2014-10-30 LAB — RAPID URINE DRUG SCREEN, HOSP PERFORMED
Amphetamines: NOT DETECTED
BENZODIAZEPINES: POSITIVE — AB
Barbiturates: NOT DETECTED
Cocaine: POSITIVE — AB
Opiates: NOT DETECTED
Tetrahydrocannabinol: NOT DETECTED

## 2014-10-30 LAB — BASIC METABOLIC PANEL
ANION GAP: 17 — AB (ref 5–15)
BUN: 8 mg/dL (ref 6–23)
CO2: 13 mmol/L — ABNORMAL LOW (ref 19–32)
Calcium: 9.2 mg/dL (ref 8.4–10.5)
Chloride: 108 mmol/L (ref 96–112)
Creatinine, Ser: 1.08 mg/dL (ref 0.50–1.10)
GFR calc Af Amer: 76 mL/min — ABNORMAL LOW (ref >90)
GFR, EST NON AFRICAN AMERICAN: 65 mL/min — AB (ref >90)
GLUCOSE: 155 mg/dL — AB (ref 70–99)
POTASSIUM: 3.7 mmol/L (ref 3.5–5.1)
Sodium: 138 mmol/L (ref 135–145)

## 2014-10-30 LAB — GLUCOSE, CAPILLARY
GLUCOSE-CAPILLARY: 165 mg/dL — AB (ref 70–99)
Glucose-Capillary: 102 mg/dL — ABNORMAL HIGH (ref 70–99)
Glucose-Capillary: 113 mg/dL — ABNORMAL HIGH (ref 70–99)
Glucose-Capillary: 130 mg/dL — ABNORMAL HIGH (ref 70–99)
Glucose-Capillary: 158 mg/dL — ABNORMAL HIGH (ref 70–99)
Glucose-Capillary: 197 mg/dL — ABNORMAL HIGH (ref 70–99)

## 2014-10-30 LAB — PHOSPHORUS: Phosphorus: 2.6 mg/dL (ref 2.3–4.6)

## 2014-10-30 LAB — INFLUENZA PANEL BY PCR (TYPE A & B)
H1N1 flu by pcr: NOT DETECTED
Influenza A By PCR: NEGATIVE
Influenza B By PCR: NEGATIVE

## 2014-10-30 MED ORDER — TIOTROPIUM BROMIDE MONOHYDRATE 18 MCG IN CAPS
18.0000 ug | ORAL_CAPSULE | Freq: Every day | RESPIRATORY_TRACT | Status: DC
  Administered 2014-10-31: 18 ug via RESPIRATORY_TRACT
  Filled 2014-10-30: qty 5

## 2014-10-30 MED ORDER — ARFORMOTEROL TARTRATE 15 MCG/2ML IN NEBU
15.0000 ug | INHALATION_SOLUTION | Freq: Two times a day (BID) | RESPIRATORY_TRACT | Status: DC
  Administered 2014-10-30 – 2014-10-31 (×2): 15 ug via RESPIRATORY_TRACT
  Filled 2014-10-30 (×6): qty 2

## 2014-10-30 MED ORDER — ALBUTEROL SULFATE (2.5 MG/3ML) 0.083% IN NEBU: 2.5000 mg | INHALATION_SOLUTION | RESPIRATORY_TRACT | Status: DC | PRN

## 2014-10-30 MED ORDER — ALPRAZOLAM 0.5 MG PO TABS
0.5000 mg | ORAL_TABLET | Freq: Three times a day (TID) | ORAL | Status: DC
  Administered 2014-10-30 – 2014-10-31 (×3): 0.5 mg via ORAL
  Filled 2014-10-30 (×3): qty 1

## 2014-10-30 MED ORDER — PREGABALIN 25 MG PO CAPS
25.0000 mg | ORAL_CAPSULE | Freq: Two times a day (BID) | ORAL | Status: DC
  Administered 2014-10-30 – 2014-10-31 (×3): 25 mg via ORAL
  Filled 2014-10-30 (×3): qty 1

## 2014-10-30 MED ORDER — BUDESONIDE 0.5 MG/2ML IN SUSP
0.5000 mg | Freq: Two times a day (BID) | RESPIRATORY_TRACT | Status: DC
  Administered 2014-10-30 – 2014-10-31 (×2): 0.5 mg via RESPIRATORY_TRACT
  Filled 2014-10-30 (×3): qty 2

## 2014-10-30 MED ORDER — INSULIN ASPART 100 UNIT/ML ~~LOC~~ SOLN
0.0000 [IU] | Freq: Three times a day (TID) | SUBCUTANEOUS | Status: DC
  Administered 2014-10-30 – 2014-10-31 (×2): 2 [IU] via SUBCUTANEOUS

## 2014-10-30 MED ORDER — ALPRAZOLAM 0.25 MG PO TABS
0.2500 mg | ORAL_TABLET | ORAL | Status: DC | PRN
Start: 2014-10-30 — End: 2014-10-31
  Administered 2014-10-30 – 2014-10-31 (×3): 0.25 mg via ORAL
  Filled 2014-10-30 (×3): qty 1

## 2014-10-30 MED ORDER — PANTOPRAZOLE SODIUM 40 MG PO TBEC
40.0000 mg | DELAYED_RELEASE_TABLET | Freq: Every day | ORAL | Status: DC
  Administered 2014-10-30 – 2014-10-31 (×2): 40 mg via ORAL
  Filled 2014-10-30 (×2): qty 1

## 2014-10-30 MED ORDER — MORPHINE SULFATE ER 30 MG PO TBCR
30.0000 mg | EXTENDED_RELEASE_TABLET | Freq: Two times a day (BID) | ORAL | Status: DC
  Administered 2014-10-30 – 2014-10-31 (×3): 30 mg via ORAL
  Filled 2014-10-30 (×2): qty 1
  Filled 2014-10-30: qty 2

## 2014-10-30 MED ORDER — INSULIN ASPART 100 UNIT/ML ~~LOC~~ SOLN: 0.0000 [IU] | Freq: Every day | SUBCUTANEOUS | Status: DC

## 2014-10-30 MED ORDER — OXYCODONE HCL 5 MG PO TABS
15.0000 mg | ORAL_TABLET | ORAL | Status: DC | PRN
  Administered 2014-10-30 – 2014-10-31 (×6): 15 mg via ORAL
  Filled 2014-10-30 (×6): qty 3

## 2014-10-30 MED ORDER — INSULIN ASPART 100 UNIT/ML ~~LOC~~ SOLN
4.0000 [IU] | Freq: Three times a day (TID) | SUBCUTANEOUS | Status: DC
  Administered 2014-10-30 – 2014-10-31 (×3): 4 [IU] via SUBCUTANEOUS

## 2014-10-30 MED ORDER — PREDNISONE 50 MG PO TABS
60.0000 mg | ORAL_TABLET | Freq: Every day | ORAL | Status: DC
  Administered 2014-10-31: 60 mg via ORAL
  Filled 2014-10-30: qty 1

## 2014-10-30 NOTE — Progress Notes (Signed)
Called report to nurse on [facility]North.  Pt on room air.  IV saline locked.  All questions answered.

## 2014-10-30 NOTE — Progress Notes (Addendum)
Tearful No resp distress C/O pain all over  Filed Vitals:   10/30/14 1000 10/30/14 1100 10/30/14 1124 10/30/14 1200  BP: 99/56     Pulse: 108 110  96  Temp:   98.8 F (37.1 C)   TempSrc:   Oral   Resp: 24 20    Height:      Weight:      SpO2: 100% 100%  100%   Tearful NAD No JVD Diffuse scattered wheezes (? component of pseudowheeze) Tachy, reg, no M NABS, soft Ext warm, no edema  I have reviewed all of today's lab results. Relevant abnormalities are discussed in the A/P section  IMP: Asthma VCD Prior trach Acute resp distress - multifactorial H/O DM 2 documented - not on any therapy PTA Chronic pain Chronic anxiety Chronic debilitation Chronic PPI use UDS + for cocaine Pt prefers SCDs over SQ heparin for DVT px  PLAN/REC: Change maintenance asthma meds to budesonide/arformoterol/tiotropium Change albuterol to q 3 hrs PRN Change steroids to pred 60 mg daily and taper over 5-7 days Check Hgb A1C in AM 3/18 Begin ACHS SSI Carb/heart diet ordered Resume home analgesic regimen of MS Contin and PRN oxycodone Resume schedule alprazolam at reduced dose with PRN available PT eval Change PPI to PO Counseled re: need for her active participation in getting herself better - no mor illicit drugs SCDs ordered Advance activity Transfer to med-surg-  Possible DC home 3/18   Billy Fischeravid Simonds, MD ; [facility] service Mobile [phone].  After 5:30 PM or weekends, call [phone]

## 2014-10-30 NOTE — Progress Notes (Addendum)
Referral received.  Note that A1C in 2015 was only 5.3% which is not indicative of diabetes.  Note that A1C has been ordered.  CBG's are currently well controlled on Novolog correction even with IV steroids.  Will reassess on 10/31/14.  Thanks, Beryl MeagerJenny Jaylin Roundy, RN, BC-ADM Inpatient Diabetes Coordinator Pager [phone]

## 2014-10-31 LAB — BASIC METABOLIC PANEL
Anion gap: 7 (ref 5–15)
BUN: 8 mg/dL (ref 6–23)
CALCIUM: 9 mg/dL (ref 8.4–10.5)
CO2: 26 mmol/L (ref 19–32)
Chloride: 104 mmol/L (ref 96–112)
Creatinine, Ser: 0.84 mg/dL (ref 0.50–1.10)
GFR calc Af Amer: >90 mL/min (ref >90)
GFR, EST NON AFRICAN AMERICAN: 88 mL/min — AB (ref >90)
Glucose, Bld: 116 mg/dL — ABNORMAL HIGH (ref 70–99)
Potassium: 3.5 mmol/L (ref 3.5–5.1)
SODIUM: 137 mmol/L (ref 135–145)

## 2014-10-31 LAB — GLUCOSE, CAPILLARY
Glucose-Capillary: 119 mg/dL — ABNORMAL HIGH (ref 70–99)
Glucose-Capillary: 136 mg/dL — ABNORMAL HIGH (ref 70–99)

## 2014-10-31 MED ORDER — FLUTICASONE PROPIONATE 50 MCG/ACT NA SUSP: 2.0000 | Freq: Two times a day (BID) | NASAL | Status: DC

## 2014-10-31 MED ORDER — AZELASTINE HCL 0.1 % NA SOLN: 2.0000 | Freq: Two times a day (BID) | NASAL | Status: DC

## 2014-10-31 MED ORDER — BUDESONIDE 0.5 MG/2ML IN SUSP: 0.5000 mg | Freq: Two times a day (BID) | RESPIRATORY_TRACT | Status: DC

## 2014-10-31 MED ORDER — ARFORMOTEROL TARTRATE 15 MCG/2ML IN NEBU: 15.0000 ug | INHALATION_SOLUTION | Freq: Two times a day (BID) | RESPIRATORY_TRACT | Status: DC

## 2014-10-31 MED ORDER — PROMETHAZINE-CODEINE 6.25-10 MG/5ML PO SYRP: 5.0000 mL | ORAL_SOLUTION | Freq: Four times a day (QID) | ORAL | Status: DC | PRN

## 2014-10-31 MED ORDER — PREDNISONE 10 MG PO TABS: ORAL_TABLET | ORAL | Status: DC

## 2014-10-31 NOTE — Evaluation (Signed)
Physical Therapy Evaluation Patient Details Name: Julie Kerr MRN: [id number] DOB: 1976-03-12 Today's Date: 10/31/2014   History of Present Illness  Patient is a 39 y/o female w/ hx of anxiety and severe VCD, recent admit in December for acute respiratory failure requiring intubation and ultimately tracheostomy (decannulated approx 1 month later).Presented 3/16 with 2 day hx fever, progressive SOB, cough. In ER with significant respiratory distress although sats, ABG completely normal.  Clinical Impression  Patient presents with increased pain, generalized weakness and balance deficits impacting mobility. Pt reports getting assist with ADLs and IADLs at home from PCA and kids. Reports 4 falls per month. Pt very tearful and emotional during session. Tolerated short distance ambulation with Min guard assist for safety. Pt would benefit from skilled PT to improve transfers, gait, balance and mobility so pt can maximize independence and minimize fall risk at home.    Follow Up Recommendations Home health PT;Supervision/Assistance - 24 hour    Equipment Recommendations  None recommended by PT    Recommendations for Other Services OT consult     Precautions / Restrictions Precautions Precautions: Fall Restrictions Weight Bearing Restrictions: No      Mobility  Bed Mobility               General bed mobility comments: Sitting EOB upon PT arrival.   Transfers Overall transfer level: Needs assistance Equipment used: Rolling walker (2 wheeled) Transfers: Sit to/from Stand Sit to Stand: Min guard         General transfer comment: Min guard for safety as pt unsteady upon standing.  Ambulation/Gait Ambulation/Gait assistance: Min guard Ambulation Distance (Feet): 40 Feet Assistive device: Rolling walker (2 wheeled) Gait Pattern/deviations: Step-through pattern;Decreased stride length;Decreased step length - right;Decreased step length - left;Decreased dorsiflexion -  right;Decreased dorsiflexion - left;Trunk flexed   Gait velocity interpretation: Below normal speed for age/gender General Gait Details: Decreased foot clearance bilaterally during advancement resulting in dragging of feet. Increased effort. Increased WB through UEs. Dyspnea present. Vitals stable.  Stairs            Wheelchair Mobility    Modified Rankin (Stroke Patients Only)       Balance Overall balance assessment: Needs assistance Sitting-balance support: No upper extremity supported;Feet supported Sitting balance-Leahy Scale: Good Sitting balance - Comments: Total A to donn socks.   Standing balance support: During functional activity Standing balance-Leahy Scale: Poor Standing balance comment: Reilent on RW.                             Pertinent Vitals/Pain Pain Assessment: Faces Faces Pain Scale: Hurts worst Pain Location: "All over" Pain Descriptors / Indicators: Sore;Aching Pain Intervention(s): Limited activity within patient's tolerance;Monitored during session;Repositioned    Home Living Family/patient expects to be discharged to:: Private residence Living Arrangements: Children (36 y/o and 90 y/o (who is 5 months pregnant).) Available Help at Discharge: Family;Personal care attendant Type of Home: House Home Access: Stairs to enter Entrance Stairs-Rails: None Entrance Stairs-Number of Steps: 3 Home Layout: One level Home Equipment: Walker - 2 wheels;Shower seat;Bedside commode Additional Comments: has hand held shower head    Prior Function Level of Independence: Needs assistance      ADL's / Homemaking Assistance Needed: Pt reports her aide comes 7 days/week to assist with ADLs and IADLs.   Comments: Uses RW for ambulation. Reports 4 falls per month.      Hand Dominance   Dominant Hand: Right  Extremity/Trunk Assessment   Upper Extremity Assessment: Defer to OT evaluation           Lower Extremity Assessment:  Generalized weakness;RLE deficits/detail;LLE deficits/detail (Decreased DF observed functionally and decreased hip flexion AROM as observed during mobility.)         Communication   Communication: No difficulties  Cognition Arousal/Alertness: Awake/alert Behavior During Therapy: Anxious (upset and tearful) Overall Cognitive Status: Within Functional Limits for tasks assessed                      General Comments      Exercises        Assessment/Plan    PT Assessment Patient needs continued PT services  PT Diagnosis Difficulty walking;Generalized weakness;Acute pain   PT Problem List Decreased strength;Pain;Decreased range of motion;Impaired sensation;Decreased activity tolerance;Decreased mobility;Decreased balance  PT Treatment Interventions Balance training;Gait training;Stair training;Patient/family education;Functional mobility training;Therapeutic activities;Therapeutic exercise   PT Goals (Current goals can be found in the Care Plan section) Acute Rehab PT Goals Patient Stated Goal: none stated PT Goal Formulation: With patient Time For Goal Achievement: 11/14/14 Potential to Achieve Goals: Fair    Frequency Min 3X/week   Barriers to discharge Decreased caregiver support Pt lives with 2 kids 5611 and 37 y/o (pregnant)    Co-evaluation               End of Session Equipment Utilized During Treatment: Gait belt Activity Tolerance: Patient limited by pain;Patient limited by fatigue Patient left: in bed;with call bell/phone within reach Nurse Communication: Mobility status         Time: 1040-1100 PT Time Calculation (min) (ACUTE ONLY): 20 min   Charges:   PT Evaluation $Initial PT Evaluation Tier I: 1 Procedure     PT G CodesAlvie Heidelberg:        Folan, Kylia Grajales A 10/31/2014, 11:39 AM Alvie HeidelbergShauna Folan, PT, DPT [phone]

## 2014-10-31 NOTE — Discharge Summary (Signed)
Physician Discharge Summary       Patient ID: Julie Kerr MRN: [id number] DOB/AGE: 1975-12-03 39 y.o.  Admit date: 10/29/2014 Discharge date: 10/31/2014  Discharge Diagnoses:  Acute respiratory failure  Asthmatic exacerbation Vocal cord dysfunction  Seasonal allergies Viral URI Chronic pain and anxiety Substance abuse (UDS positive for cocaine) GERD DM type 2  Detailed Hospital Course:  39 yo female with hx anxiety and severe VCD, recent admit in December for acute respiratory failure requiring intubation and ultimately tracheostomy (decannulated approx 1 month later). Presented 3/16 with 2 day hx fever, progressive SOB, cough. In ER with significant respiratory distress although sats, ABG completely normal. PCCM called to admit. Pt c/o fever, back pain, non productive cough, worsening SOB. Difficulty speaking r/t hoarse voice. Denies hemoptysis, chest pain, purulent sputum, n/v/d. Admitted to the ICU. Treated in usual fashion which included: supplemental oxygen, scheduled BDs, systemic steroids, and anxiolytics. She made gradual improvement and felt she was ready for d/c as of 3/18. With focus on treating upper airway component of her disease, particularly her nasal congestion and seasonal allergies.     Discharge Plan by active problems    Asthmatic exacerbation complicated by severe vocal cord dysfunction  Seasonal allergies Allergic Rhinitis  Viral URI  Plan Change maintenance asthma meds to budesonide/arformoterol/tiotropium Change albuterol to q 3 hrs PRN Change steroids to pred 60 mg daily and taper over 5-7 days Carb/heart diet ordered Resume home analgesic regimen of MS Contin and PRN oxycodone Resume schedule alprazolam at reduced dose with PRN available Add nasal steroid and antihistamine  Change PPI to PO Counseled re: need for her active participation in getting herself better - no mor illicit drugs  Chronic pain/anxiety  Plan F/u with  PCP  Substance abuse  Plan Encouraged to stop    Significant Hospital tests/ studies  Consults   Discharge Exam: BP 110/70 mmHg  Pulse 101  Temp(Src) 98.7 F (37.1 C) (Oral)  Resp 16  Ht  (1.575 m)  Wt 81.1 kg (178 lb 12.7 oz)  BMI 32.69 kg/m2  SpO2 100%  LMP 10/25/2014 Room air   NAD No JVD + upper airway wheeze and nasal congestion  Clear breath sounds  Tachy, reg, no M NABS, soft Ext warm, no edema  Labs at discharge Lab Results  Component Value Date   CREATININE 0.84 10/31/2014   BUN 8 10/31/2014   NA 137 10/31/2014   K 3.5 10/31/2014   CL 104 10/31/2014   CO2 26 10/31/2014   Lab Results  Component Value Date   WBC 11.3* 10/30/2014   HGB 10.1* 10/30/2014   HCT 32.6* 10/30/2014   MCV 80.3 10/30/2014   PLT 296 10/30/2014   Lab Results  Component Value Date   ALT 13 10/29/2014   AST 20 10/29/2014   ALKPHOS 61 10/29/2014   BILITOT 0.4 10/29/2014   Lab Results  Component Value Date   INR 1.17 10/29/2014   INR 1.06 07/30/2014   INR 1.17 07/25/2014    Current radiology studies Dg Chest Portable 1 View  10/29/2014   CLINICAL DATA:  Asthma exacerbation  EXAM: PORTABLE CHEST - 1 VIEW  COMPARISON:  September 06, 2014  FINDINGS: Lungs are clear. Heart size and pulmonary vascularity are normal. No adenopathy. No bone lesions.  IMPRESSION: No edema or consolidation.   Electronically Signed   By: Bretta Bang III M.D.   On: 10/29/2014 16:38   Dg Chest Port 1v Same Day  10/29/2014   CLINICAL DATA:  Progressive shortness of breath/asthma  EXAM: PORTABLE CHEST - 1 VIEW SAME DAY  COMPARISON:  Study obtained earlier in the day  FINDINGS: There is a shallow degree of inspiratory effort. There is no edema or consolidation. Heart size and pulmonary vascularity are normal. No adenopathy. No bone lesions. No pneumothorax.  IMPRESSION: Shallow inspiration.  No edema or consolidation.   Electronically Signed   By: Bretta BangWilliam  Woodruff III M.D.   On: 10/29/2014 17:29     Disposition:  01-Home or Self Care      Discharge Instructions    Diet - low sodium heart healthy    Complete by:  As directed      Increase activity slowly    Complete by:  As directed             Medication List    STOP taking these medications        budesonide 0.25 MG/2ML nebulizer solution  Commonly known as:  PULMICORT  Replaced by:  budesonide 0.5 MG/2ML nebulizer solution     diphenhydrAMINE 25 MG tablet  Commonly known as:  BENADRYL     ipratropium-albuterol 0.5-2.5 (3) MG/3ML Soln  Commonly known as:  DUONEB      TAKE these medications        albuterol (2.5 MG/3ML) 0.083% nebulizer solution  Commonly known as:  PROVENTIL  Take 3 mLs by nebulization every 2 (two) hours as needed for wheezing or shortness of breath.     ALPRAZolam 1 MG tablet  Commonly known as:  XANAX  Take 1 tablet (1 mg total) by mouth 3 (three) times daily as needed for anxiety.     arformoterol 15 MCG/2ML Nebu  Commonly known as:  BROVANA  Take 2 mLs (15 mcg total) by nebulization 2 (two) times daily.     azelastine 0.1 % nasal spray  Commonly known as:  ASTELIN  Place 2 sprays into both nostrils 2 (two) times daily. Use in each nostril as directed     budesonide 0.5 MG/2ML nebulizer solution  Commonly known as:  PULMICORT  Take 2 mLs (0.5 mg total) by nebulization 2 (two) times daily.     ENSURE  Take 237 mLs by mouth 3 (three) times daily between meals.     fluticasone 50 MCG/ACT nasal spray  Commonly known as:  FLONASE  Place 2 sprays into both nostrils 2 (two) times daily.     morphine 30 MG 12 hr tablet  Commonly known as:  MS CONTIN  Take 1 tablet (30 mg total) by mouth every 12 (twelve) hours.     oxyCODONE 15 MG immediate release tablet  Commonly known as:  ROXICODONE  Take 1 tablet (15 mg total) by mouth every 6 (six) hours as needed for moderate pain or breakthrough pain.     pantoprazole 40 MG tablet  Commonly known as:  PROTONIX  Take 1 tablet (40 mg  total) by mouth daily at 12 noon.     predniSONE 10 MG tablet  Commonly known as:  DELTASONE  Take 6 tabs daily x1d, then 5 tabs daily x 1d, then 4 tabs daily x 1d, then 3 tabs daily x 1d, then 2 tabs daily x 1d, then 1 tab daily and stop     pregabalin 25 MG capsule  Commonly known as:  LYRICA  Take 1 capsule (25 mg total) by mouth 2 (two) times daily.     promethazine-codeine 6.25-10 MG/5ML syrup  Commonly known as:  PHENERGAN with CODEINE  Take 5 mLs by mouth every 6 (six) hours as needed for cough.     tiotropium 18 MCG inhalation capsule  Commonly known as:  SPIRIVA  Place 18 mcg into inhaler and inhale daily.     zolpidem 5 MG tablet  Commonly known as:  AMBIEN  Take 1 tablet (5 mg total) by mouth at bedtime.       Follow-up Information    Follow up with WELLS,WENDELL, MD In 1 week.   Specialty:  Family Medicine      Follow up with Sandrea Hughs, MD On 11/17/2014.   Specialty:  Pulmonary Disease   Why:  945 am    Contact information:   [address] [place] [address] [phone]       Discharged Condition: fair  Physician Statement:   The Patient was personally examined, the discharge assessment and plan has been personally reviewed and I agree with ACNP Babcock's assessment and plan. > 30 minutes of time have been dedicated to discharge assessment, planning and discharge instructions.   Signed: BABCOCK,PETE 10/31/2014, 11:44 AM

## 2014-10-31 NOTE — Progress Notes (Signed)
Patient discharged to home with instructions. 

## 2014-10-31 NOTE — Progress Notes (Signed)
Inpatient Diabetes Program Recommendations  AACE/ADA: New Consensus Statement on Inpatient Glycemic Control (2013)  Target Ranges:  Prepandial:   less than 140 mg/dL      Peak postprandial:   less than 180 mg/dL (1-2 hours)      Critically ill patients:  140 - 180 mg/dL   Referral received. Note that A1C in 2015 was only 5.3% which is not indicative of diabetes. Note that A1C has been ordered but results pending.  CBG remain ideal.   Julie RacerJulie Rafia Shedden, RN, BA, MHA, CDE Diabetes Coordinator Inpatient Diabetes Program  [phone] (Team Pager) [phone] Patrcia Dolly( Office) 10/31/2014 10:55 AM

## 2014-11-01 LAB — HEMOGLOBIN A1C
Hgb A1c MFr Bld: 5.8 % — ABNORMAL HIGH (ref 4.8–5.6)
MEAN PLASMA GLUCOSE: 120 mg/dL

## 2014-11-17 ENCOUNTER — Inpatient Hospital Stay: Payer: Medicaid Other | Admitting: Internal Medicine

## 2014-11-24 ENCOUNTER — Inpatient Hospital Stay (HOSPITAL_COMMUNITY)
Admission: EM | Admit: 2014-11-24 | Discharge: 2014-11-27 | DRG: 202 | Disposition: A | Discharge status: Home / Self Care | Payer: Medicaid Other | Attending: Pulmonary Disease | Admitting: Pulmonary Disease

## 2014-11-24 ENCOUNTER — Encounter (HOSPITAL_COMMUNITY): Payer: Self-pay | Admitting: Emergency Medicine

## 2014-11-24 ENCOUNTER — Emergency Department (HOSPITAL_COMMUNITY): Payer: Medicaid Other

## 2014-11-24 DIAGNOSIS — Z885 Allergy status to narcotic agent status: Secondary | ICD-10-CM

## 2014-11-24 DIAGNOSIS — N179 Acute kidney failure, unspecified: Secondary | ICD-10-CM | POA: Diagnosis present

## 2014-11-24 DIAGNOSIS — Z91048 Other nonmedicinal substance allergy status: Secondary | ICD-10-CM

## 2014-11-24 DIAGNOSIS — Z981 Arthrodesis status: Secondary | ICD-10-CM

## 2014-11-24 DIAGNOSIS — R109 Unspecified abdominal pain: Secondary | ICD-10-CM

## 2014-11-24 DIAGNOSIS — Z7952 Long term (current) use of systemic steroids: Secondary | ICD-10-CM

## 2014-11-24 DIAGNOSIS — I10 Essential (primary) hypertension: Secondary | ICD-10-CM | POA: Diagnosis present

## 2014-11-24 DIAGNOSIS — J449 Chronic obstructive pulmonary disease, unspecified: Secondary | ICD-10-CM | POA: Diagnosis present

## 2014-11-24 DIAGNOSIS — F419 Anxiety disorder, unspecified: Secondary | ICD-10-CM | POA: Diagnosis present

## 2014-11-24 DIAGNOSIS — F1110 Opioid abuse, uncomplicated: Secondary | ICD-10-CM | POA: Diagnosis present

## 2014-11-24 DIAGNOSIS — J4552 Severe persistent asthma with status asthmaticus: Secondary | ICD-10-CM

## 2014-11-24 DIAGNOSIS — Z87891 Personal history of nicotine dependence: Secondary | ICD-10-CM

## 2014-11-24 DIAGNOSIS — K429 Umbilical hernia without obstruction or gangrene: Secondary | ICD-10-CM | POA: Diagnosis present

## 2014-11-24 DIAGNOSIS — R011 Cardiac murmur, unspecified: Secondary | ICD-10-CM | POA: Diagnosis present

## 2014-11-24 DIAGNOSIS — E119 Type 2 diabetes mellitus without complications: Secondary | ICD-10-CM | POA: Diagnosis present

## 2014-11-24 DIAGNOSIS — J9621 Acute and chronic respiratory failure with hypoxia: Secondary | ICD-10-CM | POA: Diagnosis present

## 2014-11-24 DIAGNOSIS — Z888 Allergy status to other drugs, medicaments and biological substances status: Secondary | ICD-10-CM

## 2014-11-24 DIAGNOSIS — J383 Other diseases of vocal cords: Secondary | ICD-10-CM | POA: Diagnosis present

## 2014-11-24 DIAGNOSIS — Z79899 Other long term (current) drug therapy: Secondary | ICD-10-CM

## 2014-11-24 DIAGNOSIS — E876 Hypokalemia: Secondary | ICD-10-CM | POA: Diagnosis present

## 2014-11-24 DIAGNOSIS — J45901 Unspecified asthma with (acute) exacerbation: Principal | ICD-10-CM | POA: Diagnosis present

## 2014-11-24 LAB — I-STAT ARTERIAL BLOOD GAS, ED
ACID-BASE DEFICIT: 1 mmol/L (ref 0.0–2.0)
Bicarbonate: 23.8 mEq/L (ref 20.0–24.0)
O2 SAT: 98 %
Patient temperature: 98.8
TCO2: 25 mmol/L (ref 0–100)
pCO2 arterial: 39.6 mmHg (ref 35.0–45.0)
pH, Arterial: 7.387 (ref 7.350–7.450)
pO2, Arterial: 112 mmHg — ABNORMAL HIGH (ref 80.0–100.0)

## 2014-11-24 LAB — CBC WITH DIFFERENTIAL/PLATELET
Basophils Absolute: 0.1 10*3/uL (ref 0.0–0.1)
Basophils Relative: 1 % (ref 0–1)
Eosinophils Absolute: 0.2 10*3/uL (ref 0.0–0.7)
Eosinophils Relative: 2 % (ref 0–5)
HEMATOCRIT: 36.8 % (ref 36.0–46.0)
HEMOGLOBIN: 11.3 g/dL — AB (ref 12.0–15.0)
LYMPHS PCT: 34 % (ref 12–46)
Lymphs Abs: 3.2 10*3/uL (ref 0.7–4.0)
MCH: 24.2 pg — ABNORMAL LOW (ref 26.0–34.0)
MCHC: 30.7 g/dL (ref 30.0–36.0)
MCV: 79 fL (ref 78.0–100.0)
MONO ABS: 0.5 10*3/uL (ref 0.1–1.0)
MONOS PCT: 5 % (ref 3–12)
NEUTROS ABS: 5.5 10*3/uL (ref 1.7–7.7)
Neutrophils Relative %: 58 % (ref 43–77)
Platelets: 396 10*3/uL (ref 150–400)
RBC: 4.66 MIL/uL (ref 3.87–5.11)
RDW: 16.1 % — ABNORMAL HIGH (ref 11.5–15.5)
WBC: 9.4 10*3/uL (ref 4.0–10.5)

## 2014-11-24 LAB — BASIC METABOLIC PANEL
Anion gap: 12 (ref 5–15)
BUN: 8 mg/dL (ref 6–23)
CHLORIDE: 107 mmol/L (ref 96–112)
CO2: 23 mmol/L (ref 19–32)
Calcium: 9.8 mg/dL (ref 8.4–10.5)
Creatinine, Ser: 1.11 mg/dL — ABNORMAL HIGH (ref 0.50–1.10)
GFR calc Af Amer: 73 mL/min — ABNORMAL LOW (ref >90)
GFR calc non Af Amer: 63 mL/min — ABNORMAL LOW (ref >90)
Glucose, Bld: 92 mg/dL (ref 70–99)
Potassium: 4.2 mmol/L (ref 3.5–5.1)
Sodium: 142 mmol/L (ref 135–145)

## 2014-11-24 MED ORDER — IPRATROPIUM BROMIDE 0.02 % IN SOLN
RESPIRATORY_TRACT | Status: AC
  Administered 2014-11-24: 0.5 mg
  Filled 2014-11-24: qty 2.5

## 2014-11-24 MED ORDER — MAGNESIUM SULFATE 2 GM/50ML IV SOLN
INTRAVENOUS | Status: AC
  Filled 2014-11-24: qty 50

## 2014-11-24 MED ORDER — IPRATROPIUM BROMIDE 0.02 % IN SOLN
RESPIRATORY_TRACT | Status: AC
  Administered 2014-11-24: 1 mg
  Filled 2014-11-24: qty 5

## 2014-11-24 MED ORDER — ALBUTEROL (5 MG/ML) CONTINUOUS INHALATION SOLN
INHALATION_SOLUTION | RESPIRATORY_TRACT | Status: AC
  Administered 2014-11-24: 20 mg/h
  Filled 2014-11-24: qty 20

## 2014-11-24 MED ORDER — ALBUTEROL (5 MG/ML) CONTINUOUS INHALATION SOLN
20.0000 mg/h | INHALATION_SOLUTION | RESPIRATORY_TRACT | Status: DC
  Administered 2014-11-24: 20 mg/h via RESPIRATORY_TRACT

## 2014-11-24 MED ORDER — METHYLPREDNISOLONE SODIUM SUCC 125 MG IJ SOLR
125.0000 mg | Freq: Once | INTRAMUSCULAR | Status: AC
  Administered 2014-11-24: 125 mg via INTRAVENOUS

## 2014-11-24 MED ORDER — LORAZEPAM 2 MG/ML IJ SOLN
1.0000 mg | Freq: Once | INTRAMUSCULAR | Status: AC
  Administered 2014-11-24: 1 mg via INTRAVENOUS
  Filled 2014-11-24: qty 1

## 2014-11-24 MED ORDER — FENTANYL CITRATE 0.05 MG/ML IJ SOLN
50.0000 ug | Freq: Once | INTRAMUSCULAR | Status: AC
  Administered 2014-11-24: 50 ug via INTRAVENOUS
  Filled 2014-11-24: qty 2

## 2014-11-24 MED ORDER — EPINEPHRINE 0.3 MG/0.3ML IJ SOAJ
0.3000 mg | Freq: Once | INTRAMUSCULAR | Status: AC
  Administered 2014-11-24: 0.3 mg via INTRAMUSCULAR
  Filled 2014-11-24: qty 0.3

## 2014-11-24 MED ORDER — MAGNESIUM SULFATE 2 GM/50ML IV SOLN
2.0000 g | Freq: Once | INTRAVENOUS | Status: AC
  Administered 2014-11-24: 2 g via INTRAVENOUS

## 2014-11-24 MED ORDER — FENTANYL CITRATE 0.05 MG/ML IJ SOLN
50.0000 ug | Freq: Once | INTRAMUSCULAR | Status: AC
Start: 2014-11-24 — End: 2014-11-24
  Administered 2014-11-24: 50 ug via INTRAVENOUS
  Filled 2014-11-24: qty 2

## 2014-11-24 NOTE — ED Notes (Signed)
Patient with audible wheezing upon arrival to [facility].  Patient unable to speak.  Patient sitting in tripod position in wheelchair.  Patient taken straight to D35.

## 2014-11-24 NOTE — ED Notes (Signed)
Report to steven, rn.  Pt care transferred

## 2014-11-24 NOTE — ED Provider Notes (Signed)
Pt very ill appearing on arrival, she is tachypneic and wheezing bilaterally She has significant h/o asthma with previous intubations She has been given nebs/epipen She may need bipap Will follow closely   Zadie Rhineonald Yonis Carreon, MD 11/24/14 2215

## 2014-11-24 NOTE — ED Notes (Signed)
Respiratory called to initiate another continuous nebulizer

## 2014-11-24 NOTE — ED Notes (Signed)
Pt refusing bipap without ativan and pain medicine, dr Clearance Cootsharper notified, orders obtained

## 2014-11-24 NOTE — ED Provider Notes (Signed)
CSN: [id number]     Arrival date & time 11/24/14  2154 History   First MD Initiated Contact with Patient 11/24/14 2200     Chief Complaint  Patient presents with  . Shortness of Breath     (Consider location/radiation/quality/duration/timing/severity/associated sxs/prior Treatment) HPI   The following history is limited due to patient's respiratory distress, daughter's limited knowledge.    This is a 39 yo female w/ hx of asthma (s/p intubation x3), tobacco abuse, polysubstance abuse, VCD, and chronic pain who presents with dyspnea.  Onset earlier today.  Last seen normal was around 1400, according to daughter.  At some point, pt began progressively worsening, describes dyspnea as "wheezing."  Persistent, severe.  No alleviation with 10 doses of multiple puffs of albuterol MDI at home.      Past Medical History  Diagnosis Date  . Asthma   . Anxiety   . COPD (chronic obstructive pulmonary disease)   . Bronchitis   . Heart murmur   . Shortness of breath   . Diabetes mellitus without complication   . Hypertension    Past Surgical History  Procedure Laterality Date  . Spinal fusion    . Tracheostomy  December 2015   Family History  Problem Relation Age of Onset  . HIV Mother   . Heart disease Father   . CVA Father   . Heart disease Other   . Emphysema Maternal Grandmother     smoked  . Asthma Sister   . Clotting disorder Sister   . Clotting disorder Maternal Grandmother   . Lung cancer Maternal Grandmother     smoked   History  Substance Use Topics  . Smoking status: Former Smoker -- 2.00 packs/day for 17 years    Types: Cigarettes    Quit date: 06/15/2014  . Smokeless tobacco: Never Used  . Alcohol Use: 0.0 oz/week    0 Standard drinks or equivalent per week     Comment: occasional   OB History    No data available     Review of Systems  Constitutional: Negative for fever and chills.  HENT: Negative for facial swelling.   Eyes: Negative for photophobia and  pain.  Respiratory: Positive for cough, shortness of breath and wheezing.   Cardiovascular: Negative for leg swelling.  Gastrointestinal: Negative for nausea, vomiting and abdominal pain.  Genitourinary: Negative for dysuria.  Musculoskeletal: Negative for arthralgias.  Skin: Negative for rash and wound.  Neurological: Negative for seizures.  Hematological: Negative for adenopathy.      Allergies  Robitussin dm; Nsaids; Rayon, purified; and Tramadol  Home Medications   Prior to Admission medications   Medication Sig Start Date End Date Taking? Authorizing Provider  albuterol (PROVENTIL) (2.5 MG/3ML) 0.083% nebulizer solution Take 3 mLs by nebulization every 2 (two) hours as needed for wheezing or shortness of breath.  08/21/14   Historical Provider, MD  ALPRAZolam Prudy Feeler) 1 MG tablet Take 1 tablet (1 mg total) by mouth 3 (three) times daily as needed for anxiety. 09/01/14   Vassie Loll, MD  arformoterol (BROVANA) 15 MCG/2ML NEBU Take 2 mLs (15 mcg total) by nebulization 2 (two) times daily. 10/31/14   Simonne Martinet, NP  azelastine (ASTELIN) 0.1 % nasal spray Place 2 sprays into both nostrils 2 (two) times daily. Use in each nostril as directed 10/31/14   Simonne Martinet, NP  budesonide (PULMICORT) 0.5 MG/2ML nebulizer solution Take 2 mLs (0.5 mg total) by nebulization 2 (two) times daily. 10/31/14  Simonne MartinetPeter E Babcock, NP  ENSURE (ENSURE) Take 237 mLs by mouth 3 (three) times daily between meals.    Historical Provider, MD  fluticasone (FLONASE) 50 MCG/ACT nasal spray Place 2 sprays into both nostrils 2 (two) times daily. 10/31/14   Simonne MartinetPeter E Babcock, NP  morphine (MS CONTIN) 30 MG 12 hr tablet Take 1 tablet (30 mg total) by mouth every 12 (twelve) hours. Patient taking differently: Take 30 mg by mouth every 8 (eight) hours.  09/01/14   Vassie Lollarlos Madera, MD  oxyCODONE (ROXICODONE) 15 MG immediate release tablet Take 1 tablet (15 mg total) by mouth every 6 (six) hours as needed for moderate pain or  breakthrough pain. Patient taking differently: Take 30 mg by mouth every 6 (six) hours as needed for pain.  09/01/14   Vassie Lollarlos Madera, MD  pantoprazole (PROTONIX) 40 MG tablet Take 1 tablet (40 mg total) by mouth daily at 12 noon. 09/01/14   Vassie Lollarlos Madera, MD  predniSONE (DELTASONE) 10 MG tablet Take 6 tabs daily x1d, then 5 tabs daily x 1d, then 4 tabs daily x 1d, then 3 tabs daily x 1d, then 2 tabs daily x 1d, then 1 tab daily and stop 10/31/14   Simonne MartinetPeter E Babcock, NP  pregabalin (LYRICA) 25 MG capsule Take 1 capsule (25 mg total) by mouth 2 (two) times daily. 09/01/14   Vassie Lollarlos Madera, MD  promethazine-codeine [facility](PHENERGAN WITH CODEINE) 6.25-10 MG/5ML syrup Take 5 mLs by mouth every 6 (six) hours as needed for cough. 10/31/14   Simonne MartinetPeter E Babcock, NP  tiotropium (SPIRIVA) 18 MCG inhalation capsule Place 18 mcg into inhaler and inhale daily.    Historical Provider, MD  zolpidem (AMBIEN) 5 MG tablet Take 1 tablet (5 mg total) by mouth at bedtime. 09/01/14   Vassie Lollarlos Madera, MD   BP 133/84 mmHg  Pulse 134  Temp(Src) 98.8 F (37.1 C) (Axillary)  Resp 29  SpO2 100%  LMP 10/25/2014 Physical Exam  Constitutional: She is oriented to person, place, and time. She appears well-developed and well-nourished. No distress.  HENT:  Head: Normocephalic and atraumatic.  Mouth/Throat: No oropharyngeal exudate.  Eyes: Conjunctivae are normal. Pupils are equal, round, and reactive to light. No scleral icterus.  Neck: Normal range of motion. No tracheal deviation present. No thyromegaly present.  Cardiovascular: Normal rate, regular rhythm and normal heart sounds.  Exam reveals no gallop and no friction rub.   No murmur heard. Pulmonary/Chest: No stridor. She is in respiratory distress. She has wheezes (diffuse). She has no rales. She exhibits no tenderness.  Tachypneic with diffuse retractions Diminished breath sounds in bases  Abdominal: Soft. She exhibits no distension and no mass. There is no tenderness. There is no  rebound and no guarding.  Musculoskeletal: Normal range of motion. She exhibits no edema.  Neurological: She is alert and oriented to person, place, and time.  Skin: Skin is warm and dry. She is not diaphoretic.    ED Course  Procedures (including critical care time) Labs Review Labs Reviewed  BASIC METABOLIC PANEL - Abnormal; Notable for the following:    Creatinine, Ser 1.11 (*)    GFR calc non Af Amer 63 (*)    GFR calc Af Amer 73 (*)    All other components within normal limits  CBC WITH DIFFERENTIAL/PLATELET - Abnormal; Notable for the following:    Hemoglobin 11.3 (*)    MCH 24.2 (*)    RDW 16.1 (*)    All other components within normal limits  I-STAT ARTERIAL  BLOOD GAS, ED - Abnormal; Notable for the following:    pO2, Arterial 112.0 (*)    All other components within normal limits  URINE RAPID DRUG SCREEN (HOSP PERFORMED)    Imaging Review Dg Chest Portable 1 View  11/24/2014   CLINICAL DATA:  Respiratory distress.  Shortness of breath.  EXAM: PORTABLE CHEST - 1 VIEW  COMPARISON:  Most recent exam 10/29/2014  FINDINGS: Lung volumes remain low. The cardiomediastinal contours are normal for technique. Pulmonary vasculature is normal. No consolidation, pleural effusion, or pneumothorax. No acute osseous abnormalities are seen.  IMPRESSION: Hypoventilatory chest, no acute process.   Electronically Signed   By: Rubye Oaks M.D.   On: 11/24/2014 22:59     EKG Interpretation   Date/Time:  Monday November 24 2014 22:09:55 EDT Ventricular Rate:  95 PR Interval:  149 QRS Duration: 80 QT Interval:  361 QTC Calculation: 454 R Axis:   57 Text Interpretation:  Sinus rhythm Consider left ventricular hypertrophy  Borderline T abnormalities, inferior leads Baseline wander in lead(s) II  III aVR aVL aVF V1 V2 V5 artifact  noted which limits evaluation Confirmed  by Bebe Shaggy  MD, Dorinda Hill ([id number]) on 11/24/2014 10:20:39 PM      MDM   Final diagnoses:  None    The following  history is limited due to patient's respiratory distress, daughter's limited knowledge.    This is a 40 yo female w/ hx of asthma (s/p intubation x3), tobacco abuse, polysubstance abuse, VCD, and chronic pain who presents with dyspnea.  Onset earlier today.  Last seen normal was around 1400, according to daughter.  At some point, pt began progressively worsening, describes dyspnea as "wheezing."  Persistent, severe.  No alleviation with 10 doses of multiple puffs of albuterol MDI at home.      On exam, pt is tachycardic, tachycpneic, retracting, wheezing diffusely with diminished breath sounds.  BP is stable.  Pt's mental status is stable but pt is unable to speak more than one word due to respiratory distress.  RT called to bedside.  IV access obtained.  Pt obviously suffering from asthma exacerbation at this time.  Initially ordered 1 hr continues nebulized albuterol, 1 mg atrovent, 2 mg magnesium, 0.3 mg IM epi.  On chart review, history of psychosomatic component appreciated.  Unfortunately, I believe that this pt is critically ill due to ashtma exacerbation in addition to suffering from panic.  Will administer 1 mg ativan, 50 mcg fentanyl.  Will continue to monitor closely.  After aftorementioned meds, over an hour of bronchodilators, pt remains tachypneic, wheezing diffusely, retractive.  BiPap attempted initially but pt did not tolerate.  I have contacted the MICU for admission.  Awaiting critical care NP eval.  Will continue bronchodilator neb.  Pt to be admitted.  She is currently stable.  I have discussed case and care has been guided by my attending physician, Dr. Bebe Shaggy.  Loma Boston, MD 11/25/14 6045  Zadie Rhine, MD 11/25/14 1130

## 2014-11-24 NOTE — ED Notes (Signed)
Pt continues to be very anxious, shaking body, unable to tolerate bi-pap, placed back on aerosol mask

## 2014-11-24 NOTE — ED Notes (Signed)
Pt continues to be anxious, shaking, squeezing side rails and tensing body.  md notified

## 2014-11-25 DIAGNOSIS — J9621 Acute and chronic respiratory failure with hypoxia: Secondary | ICD-10-CM

## 2014-11-25 DIAGNOSIS — E119 Type 2 diabetes mellitus without complications: Secondary | ICD-10-CM | POA: Diagnosis present

## 2014-11-25 DIAGNOSIS — F419 Anxiety disorder, unspecified: Secondary | ICD-10-CM | POA: Diagnosis present

## 2014-11-25 DIAGNOSIS — Z888 Allergy status to other drugs, medicaments and biological substances status: Secondary | ICD-10-CM | POA: Diagnosis not present

## 2014-11-25 DIAGNOSIS — Z87891 Personal history of nicotine dependence: Secondary | ICD-10-CM | POA: Diagnosis not present

## 2014-11-25 DIAGNOSIS — E876 Hypokalemia: Secondary | ICD-10-CM | POA: Diagnosis present

## 2014-11-25 DIAGNOSIS — Z79899 Other long term (current) drug therapy: Secondary | ICD-10-CM | POA: Diagnosis not present

## 2014-11-25 DIAGNOSIS — R1013 Epigastric pain: Secondary | ICD-10-CM | POA: Diagnosis not present

## 2014-11-25 DIAGNOSIS — R1012 Left upper quadrant pain: Secondary | ICD-10-CM | POA: Diagnosis not present

## 2014-11-25 DIAGNOSIS — J449 Chronic obstructive pulmonary disease, unspecified: Secondary | ICD-10-CM | POA: Diagnosis present

## 2014-11-25 DIAGNOSIS — Z885 Allergy status to narcotic agent status: Secondary | ICD-10-CM | POA: Diagnosis not present

## 2014-11-25 DIAGNOSIS — R011 Cardiac murmur, unspecified: Secondary | ICD-10-CM | POA: Diagnosis present

## 2014-11-25 DIAGNOSIS — Z981 Arthrodesis status: Secondary | ICD-10-CM | POA: Diagnosis not present

## 2014-11-25 DIAGNOSIS — R1030 Lower abdominal pain, unspecified: Secondary | ICD-10-CM | POA: Diagnosis not present

## 2014-11-25 DIAGNOSIS — N179 Acute kidney failure, unspecified: Secondary | ICD-10-CM | POA: Diagnosis present

## 2014-11-25 DIAGNOSIS — J383 Other diseases of vocal cords: Secondary | ICD-10-CM | POA: Diagnosis not present

## 2014-11-25 DIAGNOSIS — R0600 Dyspnea, unspecified: Secondary | ICD-10-CM | POA: Diagnosis present

## 2014-11-25 DIAGNOSIS — J45901 Unspecified asthma with (acute) exacerbation: Secondary | ICD-10-CM | POA: Diagnosis present

## 2014-11-25 DIAGNOSIS — Z91048 Other nonmedicinal substance allergy status: Secondary | ICD-10-CM | POA: Diagnosis not present

## 2014-11-25 DIAGNOSIS — Z7952 Long term (current) use of systemic steroids: Secondary | ICD-10-CM | POA: Diagnosis not present

## 2014-11-25 DIAGNOSIS — I10 Essential (primary) hypertension: Secondary | ICD-10-CM | POA: Diagnosis present

## 2014-11-25 DIAGNOSIS — K429 Umbilical hernia without obstruction or gangrene: Secondary | ICD-10-CM | POA: Diagnosis present

## 2014-11-25 DIAGNOSIS — F1110 Opioid abuse, uncomplicated: Secondary | ICD-10-CM | POA: Diagnosis present

## 2014-11-25 DIAGNOSIS — I5022 Chronic systolic (congestive) heart failure: Secondary | ICD-10-CM | POA: Diagnosis not present

## 2014-11-25 LAB — CBC
HCT: 31.2 % — ABNORMAL LOW (ref 36.0–46.0)
Hemoglobin: 9.9 g/dL — ABNORMAL LOW (ref 12.0–15.0)
MCH: 25.1 pg — ABNORMAL LOW (ref 26.0–34.0)
MCHC: 31.7 g/dL (ref 30.0–36.0)
MCV: 79 fL (ref 78.0–100.0)
PLATELETS: 308 10*3/uL (ref 150–400)
RBC: 3.95 MIL/uL (ref 3.87–5.11)
RDW: 16 % — ABNORMAL HIGH (ref 11.5–15.5)
WBC: 14.8 10*3/uL — AB (ref 4.0–10.5)

## 2014-11-25 LAB — BASIC METABOLIC PANEL
Anion gap: 12 (ref 5–15)
BUN: 12 mg/dL (ref 6–23)
CO2: 19 mmol/L (ref 19–32)
Calcium: 9 mg/dL (ref 8.4–10.5)
Chloride: 107 mmol/L (ref 96–112)
Creatinine, Ser: 1.19 mg/dL — ABNORMAL HIGH (ref 0.50–1.10)
GFR calc Af Amer: 67 mL/min — ABNORMAL LOW (ref >90)
GFR, EST NON AFRICAN AMERICAN: 58 mL/min — AB (ref >90)
Glucose, Bld: 206 mg/dL — ABNORMAL HIGH (ref 70–99)
Potassium: 2.9 mmol/L — ABNORMAL LOW (ref 3.5–5.1)
SODIUM: 138 mmol/L (ref 135–145)

## 2014-11-25 LAB — RAPID URINE DRUG SCREEN, HOSP PERFORMED
AMPHETAMINES: NOT DETECTED
BARBITURATES: NOT DETECTED
Benzodiazepines: POSITIVE — AB
Cocaine: POSITIVE — AB
Opiates: POSITIVE — AB
Tetrahydrocannabinol: NOT DETECTED

## 2014-11-25 LAB — MRSA PCR SCREENING: MRSA BY PCR: NEGATIVE

## 2014-11-25 MED ORDER — IPRATROPIUM-ALBUTEROL 0.5-2.5 (3) MG/3ML IN SOLN: 3.0000 mL | Freq: Four times a day (QID) | RESPIRATORY_TRACT | Status: DC

## 2014-11-25 MED ORDER — BUDESONIDE 0.25 MG/2ML IN SUSP
0.2500 mg | Freq: Two times a day (BID) | RESPIRATORY_TRACT | Status: DC
  Administered 2014-11-25 – 2014-11-27 (×4): 0.25 mg via RESPIRATORY_TRACT
  Filled 2014-11-25 (×9): qty 2

## 2014-11-25 MED ORDER — ARFORMOTEROL TARTRATE 15 MCG/2ML IN NEBU
15.0000 ug | INHALATION_SOLUTION | Freq: Two times a day (BID) | RESPIRATORY_TRACT | Status: DC
  Administered 2014-11-25 – 2014-11-27 (×4): 15 ug via RESPIRATORY_TRACT
  Filled 2014-11-25 (×2): qty 2

## 2014-11-25 MED ORDER — FLUTICASONE PROPIONATE 50 MCG/ACT NA SUSP
2.0000 | Freq: Two times a day (BID) | NASAL | Status: DC
  Administered 2014-11-25 – 2014-11-27 (×5): 2 via NASAL
  Filled 2014-11-25: qty 16

## 2014-11-25 MED ORDER — LORAZEPAM 2 MG/ML IJ SOLN
1.0000 mg | INTRAMUSCULAR | Status: DC | PRN
  Administered 2014-11-25 – 2014-11-26 (×7): 1 mg via INTRAVENOUS
  Filled 2014-11-25 (×7): qty 1

## 2014-11-25 MED ORDER — PANTOPRAZOLE SODIUM 40 MG IV SOLR
40.0000 mg | INTRAVENOUS | Status: DC
  Administered 2014-11-25 – 2014-11-27 (×3): 40 mg via INTRAVENOUS
  Filled 2014-11-25 (×3): qty 40

## 2014-11-25 MED ORDER — POTASSIUM CHLORIDE 10 MEQ/100ML IV SOLN
10.0000 meq | INTRAVENOUS | Status: AC
  Administered 2014-11-25 (×3): 10 meq via INTRAVENOUS
  Filled 2014-11-25 (×3): qty 100

## 2014-11-25 MED ORDER — ALBUTEROL SULFATE (2.5 MG/3ML) 0.083% IN NEBU
2.5000 mg | INHALATION_SOLUTION | RESPIRATORY_TRACT | Status: DC | PRN
  Administered 2014-11-25 (×2): 2.5 mg via RESPIRATORY_TRACT
  Filled 2014-11-25 (×2): qty 3

## 2014-11-25 MED ORDER — SODIUM CHLORIDE 0.9 % IV SOLN
INTRAVENOUS | Status: DC
  Administered 2014-11-25 – 2014-11-26 (×2): via INTRAVENOUS
  Administered 2014-11-26: 75 mL/h via INTRAVENOUS

## 2014-11-25 MED ORDER — MORPHINE SULFATE 2 MG/ML IJ SOLN
1.0000 mg | INTRAMUSCULAR | Status: DC | PRN
  Administered 2014-11-25 (×6): 2 mg via INTRAVENOUS
  Filled 2014-11-25 (×6): qty 1

## 2014-11-25 MED ORDER — METHYLPREDNISOLONE SODIUM SUCC 40 MG IJ SOLR
40.0000 mg | Freq: Two times a day (BID) | INTRAMUSCULAR | Status: DC
  Administered 2014-11-25 – 2014-11-26 (×3): 40 mg via INTRAVENOUS
  Filled 2014-11-25 (×7): qty 1

## 2014-11-25 MED ORDER — MORPHINE SULFATE 4 MG/ML IJ SOLN
4.0000 mg | INTRAMUSCULAR | Status: DC | PRN
  Administered 2014-11-26 – 2014-11-27 (×12): 4 mg via INTRAVENOUS
  Filled 2014-11-25 (×12): qty 1

## 2014-11-25 MED ORDER — HEPARIN SODIUM (PORCINE) 5000 UNIT/ML IJ SOLN
5000.0000 [IU] | Freq: Three times a day (TID) | INTRAMUSCULAR | Status: DC
  Administered 2014-11-25 – 2014-11-26 (×5): 5000 [IU] via SUBCUTANEOUS
  Filled 2014-11-25 (×10): qty 1

## 2014-11-25 MED ORDER — MORPHINE SULFATE 2 MG/ML IJ SOLN
1.0000 mg | INTRAMUSCULAR | Status: DC | PRN
  Administered 2014-11-25 (×3): 1 mg via INTRAVENOUS
  Filled 2014-11-25 (×3): qty 1

## 2014-11-25 NOTE — ED Notes (Signed)
Patient on a clear diet.

## 2014-11-25 NOTE — Progress Notes (Signed)
eLink Physician-Brief Progress Note Patient Name: Julie Kerr DOB: 02/25/1976 MRN: [id number]   Date of Service  11/25/2014  HPI/Events of Note  hypokalemia  eICU Interventions  Potassium replaced     Intervention Category Intermediate Interventions: Electrolyte abnormality - evaluation and management  DETERDING,ELIZABETH 11/25/2014, 6:26 AM

## 2014-11-25 NOTE — Progress Notes (Signed)
eLink Physician-Brief Progress Note Patient Name: Julie Kerr DOB: 07/07/1976 MRN: [id number]   Date of Service  11/25/2014  HPI/Events of Note  Pain still reported  eICU Interventions  Increase dose morph sulfate     Intervention Category Minor Interventions: Routine modifications to care plan (e.g. PRN medications for pain, fever)  Nelda BucksFEINSTEIN,Upton Russey J. 11/25/2014, 11:39 PM

## 2014-11-25 NOTE — ED Notes (Signed)
intensivist at bedside.

## 2014-11-25 NOTE — ED Notes (Signed)
Patient asking for another breakfast tray. She also wants her doctor to order her more pain medicine

## 2014-11-25 NOTE — ED Notes (Addendum)
Pt got 1 mg Morphine IV at 5:42 am and 5 min later pt call c/o medication for pain not working. Pt c/o that she can't move up on bed, that she has to be assisted, pt getting upset and started crying and shaking.

## 2014-11-25 NOTE — ED Notes (Signed)
Pt begged to stop potassium infusion and refuses in other potassium infusion.

## 2014-11-25 NOTE — H&P (Signed)
Name: Julie Kerr MRN: [id number] DOB: [birth year]    ADMISSION DATE:  11/24/2014 CONSULTATION DATE: 11/24/2014  REFERRING MD :  EDP  CHIEF COMPLAINT:  Respiratory distress  BRIEF PATIENT DESCRIPTION: 39 yo female with hx anxiety and severe VCD, recent admits in December 2015 and 3/16 for acute respiratory failure requiring intubation in December and ultimately tracheostomy (decannulated approx 1 month later). Presented again 4/11 with complaints of dyspnea wheeze. Some improvement in ED after nebs/steroids. PCCM to admit.   SIGNIFICANT EVENTS  07/2014 > admitted for severe VCD, required ETT, trach (DS).  08/2014 > decannulation 4/12 > admitted for respiratory distress.  STUDIES:    HISTORY OF PRESENT ILLNESS:  39 year old female well known to PCCM for recent admissions d/t severe VCD. In 07/2014 she required intubation and eventually tracheostomy for this. She was decannulated about 1 month later. She has had several admission since that time, which have been due to less severe exacerbations. Has responded well to steroids, antianxiolytics, and pain management during these subsequent admissions. 4/11 she was at home in her usual state of health when she has sudden onset of respiratory distress. Cannot recall any exacerbating events. She quickly came to ED and was found to have severe dyspnea. She was given nebulized BDs, steroids, and ativan, to which she has responded well. She was offered BiPAP but refused. PCCM to admit.   PAST MEDICAL HISTORY :   has a past medical history of Asthma; Anxiety; COPD (chronic obstructive pulmonary disease); Bronchitis; Heart murmur; Shortness of breath; Diabetes mellitus without complication; and Hypertension.  has past surgical history that includes Spinal fusion and Tracheostomy (December 2015). Prior to Admission medications   Medication Sig Start Date End Date Taking? Authorizing Provider  albuterol (PROVENTIL) (2.5 MG/3ML) 0.083% nebulizer solution  Take 3 mLs by nebulization every 2 (two) hours as needed for wheezing or shortness of breath.  08/21/14   Historical Provider, MD  ALPRAZolam Prudy Feeler) 1 MG tablet Take 1 tablet (1 mg total) by mouth 3 (three) times daily as needed for anxiety. 09/01/14   Vassie Loll, MD  arformoterol (BROVANA) 15 MCG/2ML NEBU Take 2 mLs (15 mcg total) by nebulization 2 (two) times daily. 10/31/14   Simonne Martinet, NP  azelastine (ASTELIN) 0.1 % nasal spray Place 2 sprays into both nostrils 2 (two) times daily. Use in each nostril as directed 10/31/14   Simonne Martinet, NP  budesonide (PULMICORT) 0.5 MG/2ML nebulizer solution Take 2 mLs (0.5 mg total) by nebulization 2 (two) times daily. 10/31/14   Simonne Martinet, NP  ENSURE (ENSURE) Take 237 mLs by mouth 3 (three) times daily between meals.    Historical Provider, MD  fluticasone (FLONASE) 50 MCG/ACT nasal spray Place 2 sprays into both nostrils 2 (two) times daily. 10/31/14   Simonne Martinet, NP  morphine (MS CONTIN) 30 MG 12 hr tablet Take 1 tablet (30 mg total) by mouth every 12 (twelve) hours. Patient taking differently: Take 30 mg by mouth every 8 (eight) hours.  09/01/14   Vassie Loll, MD  oxyCODONE (ROXICODONE) 15 MG immediate release tablet Take 1 tablet (15 mg total) by mouth every 6 (six) hours as needed for moderate pain or breakthrough pain. Patient taking differently: Take 30 mg by mouth every 6 (six) hours as needed for pain.  09/01/14   Vassie Loll, MD  pantoprazole (PROTONIX) 40 MG tablet Take 1 tablet (40 mg total) by mouth daily at 12 noon. 09/01/14   Vassie Loll, MD  predniSONE (DELTASONE) 10 MG tablet Take 6 tabs daily x1d, then 5 tabs daily x 1d, then 4 tabs daily x 1d, then 3 tabs daily x 1d, then 2 tabs daily x 1d, then 1 tab daily and stop 10/31/14   Simonne Martinet, NP  pregabalin (LYRICA) 25 MG capsule Take 1 capsule (25 mg total) by mouth 2 (two) times daily. 09/01/14   Vassie Loll, MD  promethazine-codeine [facility] WITH CODEINE) 6.25-10  MG/5ML syrup Take 5 mLs by mouth every 6 (six) hours as needed for cough. 10/31/14   Simonne Martinet, NP  tiotropium (SPIRIVA) 18 MCG inhalation capsule Place 18 mcg into inhaler and inhale daily.    Historical Provider, MD  zolpidem (AMBIEN) 5 MG tablet Take 1 tablet (5 mg total) by mouth at bedtime. 09/01/14   Vassie Loll, MD   Allergies  Allergen Reactions  . Robitussin Dm [Dextromethorphan-Guaifenesin] Nausea And Vomiting  . Nsaids Hives  . Rayon, Purified Hives  . Tramadol Hives    FAMILY HISTORY:  family history includes Asthma in her sister; CVA in her father; Clotting disorder in her maternal grandmother and sister; Emphysema in her maternal grandmother; HIV in her mother; Heart disease in her father and other; Lung cancer in her maternal grandmother. SOCIAL HISTORY:  reports that she quit smoking about 5 months ago. Her smoking use included Cigarettes. She has a 34 pack-year smoking history. She has never used smokeless tobacco. She reports that she drinks alcohol. She reports that she does not use illicit drugs.  REVIEW OF SYSTEMS:   Bolds are positive  Constitutional: weight loss, gain, night sweats, Fevers, chills, fatigue .  HEENT: headaches, Sore throat, sneezing, nasal congestion, post nasal drip, Difficulty swallowing, Tooth/dental problems, visual complaints visual changes, ear ache CV:  chest pain, radiates: ,Orthopnea, PND, swelling in lower extremities, dizziness, palpitations, syncope.  GI  heartburn, indigestion, periumbilical abdominal pain, nausea, vomiting, diarrhea, change in bowel habits, loss of appetite, bloody stools.  Resp: cough, non-productive: , hemoptysis, dyspnea, chest pain, pleuritic.  Skin: rash or itching or icterus GU: dysuria, change in color of urine, urgency or frequency. flank pain, hematuria  MS: joint pain or swelling. decreased range of motion  Psych: change in mood or affect. depression or anxiety.  Neuro: difficulty with speech, weakness,  numbness, ataxia    SUBJECTIVE:   VITAL SIGNS: Temp:  [98.8 F (37.1 C)] 98.8 F (37.1 C) (04/11 2250) Pulse Rate:  [94-134] 133 (04/12 0000) Resp:  [29-55] 55 (04/12 0000) BP: (114-143)/(42-123) 123/55 mmHg (04/12 0000) SpO2:  [99 %-100 %] 99 % (04/12 0000) FiO2 (%):  [40 %] 40 % (04/11 2226)  PHYSICAL EXAMINATION: General:  Young female of normal body habitus in NAD Neuro:  Alert, oriented, non-focal HEENT:  Phonates well, [place]/AT, no JVD Cardiovascular:  Tachy, regular Lungs: Upper airway with minimal stridor Abdomen:  Generalized tenderness, umbilical hernia which is protruding more than normal, unable to reduce.  Musculoskeletal:  No acute deformity or ROM limitation.  Skin:  Grossly intact   Recent Labs Lab 11/24/14 2203  NA 142  K 4.2  CL 107  CO2 23  BUN 8  CREATININE 1.11*  GLUCOSE 92    Recent Labs Lab 11/24/14 2203  HGB 11.3*  HCT 36.8  WBC 9.4  PLT 396   Dg Chest Portable 1 View  11/24/2014   CLINICAL DATA:  Respiratory distress.  Shortness of breath.  EXAM: PORTABLE CHEST - 1 VIEW  COMPARISON:  Most recent exam 10/29/2014  FINDINGS: Lung volumes remain low. The cardiomediastinal contours are normal for technique. Pulmonary vasculature is normal. No consolidation, pleural effusion, or pneumothorax. No acute osseous abnormalities are seen.  IMPRESSION: Hypoventilatory chest, no acute process.   Electronically Signed   By: Rubye OaksMelanie  Ehinger M.D.   On: 11/24/2014 22:59    ASSESSMENT / PLAN:  Known Severe VCD - Anxiety plays a BIG part in her VCD S/p recent trach and decannulation Respiratory distress > improved Anxiety Polysubstance abuse PLAN: Tele monitoring with continuous O2 sats PRN Ativan 1 mg q 4 hours  Solumedrol 40 q12h Continue home budesonide, brovana Send tox screen Admit to sdu, we will follow Do not suspect infection, however monitor WBC and fever curve.  Abdominal Pain - periumbilical due to known hernia PLAN: PRN morphine 1mg  q  2 hours Consider CT abdomen r/o incarceration Consider surgical consultation in AM   AKI: suspect dehydration PLAN: Gentle IVF hydration NS @ 6175mL/hr Follow Bmet  Admit to SDU Diet: NPO except ice chips VTE ppx: SubQ heparin SUP: IV protonix    Joneen RoachPaul Shawnique Mariotti, AGACNP-BC [facility] Pager [phone] or [phone]  11/25/2014 1:37 AM

## 2014-11-25 NOTE — ED Notes (Signed)
Admitting MD notified of potasium level of 2.9. MD to review and put some orders.

## 2014-11-26 ENCOUNTER — Inpatient Hospital Stay (HOSPITAL_COMMUNITY): Payer: Medicaid Other

## 2014-11-26 DIAGNOSIS — I5022 Chronic systolic (congestive) heart failure: Secondary | ICD-10-CM

## 2014-11-26 DIAGNOSIS — R1030 Lower abdominal pain, unspecified: Secondary | ICD-10-CM

## 2014-11-26 LAB — COMPREHENSIVE METABOLIC PANEL
ALT: 11 U/L (ref 0–35)
AST: 18 U/L (ref 0–37)
Albumin: 3.6 g/dL (ref 3.5–5.2)
Alkaline Phosphatase: 58 U/L (ref 39–117)
Anion gap: 8 (ref 5–15)
BILIRUBIN TOTAL: 0.1 mg/dL — AB (ref 0.3–1.2)
BUN: <5 mg/dL — ABNORMAL LOW (ref 6–23)
CALCIUM: 9.6 mg/dL (ref 8.4–10.5)
CHLORIDE: 109 mmol/L (ref 96–112)
CO2: 22 mmol/L (ref 19–32)
CREATININE: 1 mg/dL (ref 0.50–1.10)
GFR calc non Af Amer: 72 mL/min — ABNORMAL LOW (ref >90)
GFR, EST AFRICAN AMERICAN: 83 mL/min — AB (ref >90)
GLUCOSE: 126 mg/dL — AB (ref 70–99)
Potassium: 4.4 mmol/L (ref 3.5–5.1)
Sodium: 139 mmol/L (ref 135–145)
Total Protein: 6.8 g/dL (ref 6.0–8.3)

## 2014-11-26 LAB — BASIC METABOLIC PANEL
Anion gap: 8 (ref 5–15)
BUN: 6 mg/dL (ref 6–23)
CO2: 24 mmol/L (ref 19–32)
Calcium: 9.7 mg/dL (ref 8.4–10.5)
Chloride: 104 mmol/L (ref 96–112)
Creatinine, Ser: 0.79 mg/dL (ref 0.50–1.10)
Glucose, Bld: 94 mg/dL (ref 70–99)
Potassium: 5 mmol/L (ref 3.5–5.1)
Sodium: 136 mmol/L (ref 135–145)

## 2014-11-26 MED ORDER — ALPRAZOLAM 0.5 MG PO TABS
0.5000 mg | ORAL_TABLET | Freq: Three times a day (TID) | ORAL | Status: DC | PRN
  Administered 2014-11-26 – 2014-11-27 (×4): 0.5 mg via ORAL
  Filled 2014-11-26 (×4): qty 1

## 2014-11-26 MED ORDER — POTASSIUM CHLORIDE 10 MEQ/100ML IV SOLN
10.0000 meq | INTRAVENOUS | Status: DC
Start: 2014-11-26 — End: 2014-11-26

## 2014-11-26 MED ORDER — PROMETHAZINE-CODEINE 6.25-10 MG/5ML PO SYRP
5.0000 mL | ORAL_SOLUTION | Freq: Four times a day (QID) | ORAL | Status: DC | PRN
  Administered 2014-11-26 – 2014-11-27 (×2): 5 mL via ORAL
  Filled 2014-11-26 (×2): qty 5

## 2014-11-26 MED ORDER — POTASSIUM CHLORIDE CRYS ER 20 MEQ PO TBCR
80.0000 meq | EXTENDED_RELEASE_TABLET | Freq: Once | ORAL | Status: AC
  Administered 2014-11-26: 80 meq via ORAL
  Filled 2014-11-26: qty 4

## 2014-11-26 MED ORDER — IOHEXOL 300 MG/ML  SOLN
25.0000 mL | INTRAMUSCULAR | Status: AC
  Administered 2014-11-26 (×2): 25 mL via ORAL

## 2014-11-26 NOTE — Progress Notes (Signed)
Nurse took pt to Ct of abdomen. Pt is still complaining of pain in abdomen rating a 9-10 /10 on pain scale despite receiving 4 mg of IV Morphine per md orders. Pt now back to floor and resting. Vitals within normal limits. Will monitor.

## 2014-11-26 NOTE — Progress Notes (Signed)
Utilization Review Completed.  

## 2014-11-26 NOTE — Progress Notes (Signed)
Pt is crying out with pain. Gave morphine per md orders. Gave phenergan-codeine per md orders. Called PCCM Danford BadKathryn Whiteheart NP to report pt's discomfort and complaints of burning and sharp pain in abdomen. CT placed for pt to have. Will monitor.

## 2014-11-26 NOTE — Progress Notes (Signed)
Name: Julie Kerr MRN: [id number] DOB: 04/15/1976    ADMISSION DATE:  11/24/2014 CONSULTATION DATE: 11/24/2014  REFERRING MD :  EDP  CHIEF COMPLAINT:  Respiratory distress  BRIEF PATIENT DESCRIPTION: 39 yo female with hx anxiety and severe VCD, recent admits in December 2015 and 3/16 for acute respiratory failure requiring intubation in December and ultimately tracheostomy (decannulated approx 1 month later). Presented again 4/11 with complaints of abd pain, dyspnea and wheeze. Some improvement in ED after nebs/steroids. PCCM to admit.   SIGNIFICANT EVENTS  07/2014 > admitted for severe VCD, required ETT, trach (DS).  08/2014 > decannulation 4/12 > admitted for respiratory distress.  STUDIES:  CT abd 4/13>>>   SUBJECTIVE:  Still c/o significant abd pain.  Tearful.   Intermittent, comes in "waves" but severe when she "has a wave of pain".  Denies SOB, chest pain, n/v.    VITAL SIGNS: Temp:  [98.2 F (36.8 C)-99.2 F (37.3 C)] 98.6 F (37 C) (04/13 0800) Pulse Rate:  [88-115] 90 (04/13 0900) Resp:  [18-27] 18 (04/13 0900) BP: (104-126)/(43-71) 125/71 mmHg (04/13 0800) SpO2:  [98 %-100 %] 98 % (04/13 0900)  PHYSICAL EXAMINATION: General:  Young female of normal body habitus, uncomfortable appearing  Neuro:  Alert, oriented, non-focal HEENT:  Phonates well, [place]/AT, no JVD Cardiovascular:  Tachy, regular Lungs: resps even, non labored on RA, Upper airway with minimal stridor Abdomen:  Diffusely tender, worse LUQ, mild gaurding, no rebound, abd mildly distended, umbilical hernia which is protruding more than normal, unable to reduce.  Musculoskeletal:  No acute deformity or ROM limitation.  Skin:  Grossly intact   Recent Labs Lab 11/24/14 2203 11/25/14 0511 11/26/14 0944  NA 142 138 139  K 4.2 2.9* 4.4  CL 107 107 109  CO2 BUN 8 12 <5*  CREATININE 1.11* 1.19* 1.00  GLUCOSE 92 206* 126*    Recent Labs Lab 11/24/14 2203 11/25/14 0511  HGB 11.3* 9.9*    HCT 36.8 31.2*  WBC 9.4 14.8*  PLT 396 308   Dg Chest Portable 1 View  11/24/2014   CLINICAL DATA:  Respiratory distress.  Shortness of breath.  EXAM: PORTABLE CHEST - 1 VIEW  COMPARISON:  Most recent exam 10/29/2014  FINDINGS: Lung volumes remain low. The cardiomediastinal contours are normal for technique. Pulmonary vasculature is normal. No consolidation, pleural effusion, or pneumothorax. No acute osseous abnormalities are seen.  IMPRESSION: Hypoventilatory chest, no acute process.   Electronically Signed   By: Rubye Oaks M.D.   On: 11/24/2014 22:59    ASSESSMENT / PLAN:  Known Severe VCD - Anxiety plays a BIG part in her VCD S/p recent trach and decannulation Respiratory distress > improved Anxiety Polysubstance abuse PLAN: Tele monitoring with continuous O2 sats PRN Ativan 1 mg q 4 hours  Cont Solumedrol 40 q12h Continue home budesonide, brovana Drug abuse counseling - ordered SW consult  Do not suspect infection, however monitor WBC and fever curve.  Abdominal Pain - periumbilical due to known hernia PLAN: PRN morphine  q 2 hours Will proceed with CT abd given worsening pain  AKI: suspect dehydration Hypokalemia  PLAN: Gentle IVF hydration NS @ 73mL/hr Follow Bmet Replete K - could not get PO K down this am, will order IV runs   Admit to SDU Diet: NPO except ice chips VTE ppx: SubQ heparin SUP: IV protonix   Dirk Dress, NP 11/26/2014  11:01 AM Pager: [phone] or [phone]   Attending:  I  have seen and examined the patient with nurse practitioner and agree with the note above.   Chart reviewed, vitals, and this morning's CXR personally reviewed  On my exam her lungs are clear and she is less short of breath.  Her abdomen is mildly tender but there is no guarding or rebound.  Abdominal pain is worrisome given hernia, but I'm encouraged by no peritoneal signs, normal vitals  Plan CT abdomen today cotinue diuresis  Heber CarolinaBrent Gearld Kerstein,  MD [facility] Pager: [phone] Cell: [phone] If no response, call [phone]

## 2014-11-26 NOTE — Progress Notes (Signed)
Paged PCCM doctor about pt's Low potassium level at 2.9. Received report from night nurse that pt refused IV potassium. Nurse explained to pt about how if potassium is not within right range it can affect the heart. Waiting to talk with MD.

## 2014-11-26 NOTE — Progress Notes (Signed)
eLink Physician-Brief Progress Note Patient Name: Julie Kerr DOB: 11/10/1975 MRN: [id number]   Date of Service  11/26/2014  HPI/Events of Note  Patient requests diet. NPO d/t abdominal pain which is now resolved. CT Scan of Abdomen and Pelvis ---> No acute findings identified within the abdomen or pelvis. No significant change in posterior uterine fibroid, small paraumbilical ventral hernia, and chronic avascular necrosis of both femoral heads.  eICU Interventions  Will order a clear liquid diet.      Intervention Category Minor Interventions: Routine modifications to care plan (e.g. PRN medications for pain, fever)  Sommer,Steven Eugene 11/26/2014, 9:47 PM

## 2014-11-27 DIAGNOSIS — R1013 Epigastric pain: Secondary | ICD-10-CM

## 2014-11-27 LAB — CBC
HCT: 37.7 % (ref 36.0–46.0)
HEMOGLOBIN: 11.6 g/dL — AB (ref 12.0–15.0)
MCH: 24.8 pg — AB (ref 26.0–34.0)
MCHC: 30.8 g/dL (ref 30.0–36.0)
MCV: 80.6 fL (ref 78.0–100.0)
Platelets: 453 10*3/uL — ABNORMAL HIGH (ref 150–400)
RBC: 4.68 MIL/uL (ref 3.87–5.11)
RDW: 16.7 % — ABNORMAL HIGH (ref 11.5–15.5)
WBC: 19.2 10*3/uL — AB (ref 4.0–10.5)

## 2014-11-27 LAB — BASIC METABOLIC PANEL
Anion gap: 10 (ref 5–15)
BUN: 6 mg/dL (ref 6–23)
CO2: 23 mmol/L (ref 19–32)
CREATININE: 0.75 mg/dL (ref 0.50–1.10)
Calcium: 9.9 mg/dL (ref 8.4–10.5)
Chloride: 103 mmol/L (ref 96–112)
GLUCOSE: 117 mg/dL — AB (ref 70–99)
Potassium: 5.5 mmol/L — ABNORMAL HIGH (ref 3.5–5.1)
Sodium: 136 mmol/L (ref 135–145)

## 2014-11-27 LAB — HIV ANTIBODY (ROUTINE TESTING W REFLEX): HIV Screen 4th Generation wRfx: NONREACTIVE

## 2014-11-27 MED ORDER — WHITE PETROLATUM GEL
Status: AC
  Administered 2014-11-27: 0.2
  Filled 2014-11-27: qty 1

## 2014-11-27 MED ORDER — PREDNISONE 10 MG PO TABS: ORAL_TABLET | ORAL | Status: DC

## 2014-11-27 MED ORDER — PROMETHAZINE-CODEINE 6.25-10 MG/5ML PO SYRP: 5.0000 mL | ORAL_SOLUTION | Freq: Four times a day (QID) | ORAL | Status: DC | PRN
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
## 2014-11-27 NOTE — Progress Notes (Signed)
-  Pt has been discharged to home per md orders. Social work talked with pt for a while about pt's living conditions. SW gave pt a voucher for a cab. Nurse assistant taking pt to d/c doors to go home. Pt in no distress on departure.

## 2014-11-27 NOTE — Discharge Summary (Signed)
Physician Discharge Summary  Patient ID: Julie Kerr MRN: [id number] DOB/AGE: 37/04/1976 37 y.o.  Admit date: 11/24/2014 Discharge date: 11/27/2014  Problem List Active Problems:   Acute on chronic respiratory failure with hypoxemia  HPI: 39 year old female well known to PCCM for recent admissions d/t severe VCD. In 07/2014 she required intubation and eventually tracheostomy for this. She was decannulated about 1 month later. She has had several admission since that time, which have been due to less severe exacerbations. Has responded well to steroids, antianxiolytics, and pain management during these subsequent admissions. 4/11 she was at home in her usual state of health when she has sudden onset of respiratory distress. Cannot recall any exacerbating events. She quickly came to ED and was found to have severe dyspnea. She was given nebulized BDs, steroids, and ativan, to which she has responded well. She was offered BiPAP but refused. PCCM to admit.  Hospital Course:  SIGNIFICANT EVENTS  07/2014 > admitted for severe VCD, required ETT, trach (DS).  08/2014 > decannulation 4/12 > admitted for respiratory distress. 4/14 dc to home   STUDIES:  CT abd 4/13>>>neg  ASSESSMENT / PLAN:  Known Severe VCD - Anxiety plays a BIG part in her VCD S/p recent trach and decannulation Respiratory distress > improved Anxiety Polysubstance abuse PLAN: Tele monitoring with continuous O2 sats PRN Ativan 1 mg q 4 hours  Cont Solumedrol 40 q12h Continue home budesonide, brovana Drug abuse counseling - ordered SW consult  Do not suspect infection, however monitor WBC and fever curve. Negative culture data. 4/14 dc home  Abdominal Pain - periumbilical due to known hernia PLAN: CT scan 4/13 negative. Continue PPI  AKI: suspect dehydration Hypokalemia  Lab Results  Component Value Date   CREATININE 0.75 11/27/2014   CREATININE 0.79 11/26/2014   CREATININE 1.00 11/26/2014    Recent  Labs Lab 11/26/14 0944 11/26/14 1923 11/27/14 0250  K 4.4 5.0 5.5*     PLAN: Gentle IVF hydration NS @ 4675mL/hr Follow Bmet Replete K - could not get PO K down this am, will order IV runs K+ overcorrected. Will follow up K+ level as outpatient  Admit to SDU PPI Pain meds 4/14 [facility] and dc home. She needs medication clarification. Listed home medications are incorrect. Will arrange for follow up with ANP for med calendar.     Labs at discharge Lab Results  Component Value Date   CREATININE 0.75 11/27/2014   BUN 6 11/27/2014   NA 136 11/27/2014   K 5.5* 11/27/2014   CL 103 11/27/2014   CO2 23 11/27/2014   Lab Results  Component Value Date   WBC 19.2* 11/27/2014   HGB 11.6* 11/27/2014   HCT 37.7 11/27/2014   MCV 80.6 11/27/2014   PLT 453* 11/27/2014   Lab Results  Component Value Date   ALT 11 11/26/2014   AST 18 11/26/2014   ALKPHOS 58 11/26/2014   BILITOT 0.1* 11/26/2014   Lab Results  Component Value Date   INR 1.17 10/29/2014   INR 1.06 07/30/2014   INR 1.17 07/25/2014    Current radiology studies Ct Abdomen Pelvis Wo Contrast  11/26/2014   CLINICAL DATA:  Generalized and diffuse abdominal pain.  EXAM: CT ABDOMEN AND PELVIS WITHOUT CONTRAST  TECHNIQUE: Multidetector CT imaging of the abdomen and pelvis was performed following the standard protocol without IV contrast.  COMPARISON:  10/07/2014  FINDINGS: Lower chest:  Unremarkable.  Hepatobiliary: No mass visualized on this unenhanced exam. Gallbladder is unremarkable.  Pancreas:  No mass or inflammatory process visualized on this unenhanced exam.  Spleen:  Within normal limits in size.  Adrenal Glands:  No masses identified.  Kidneys/Urinary tract: No evidence of urolithiasis or hydronephrosis.  Stomach/Bowel/Peritoneum:  Unremarkable.  Vascular/Lymphatic: No pathologically enlarged lymph nodes identified. No other significant abnormality identified.  Reproductive: Enlarged uterus with posterior fibroid again  noted although less well visualized on this noncontrast study. Tiny amount of free fluid noted in pelvic cul-de-sac.  Other: Small paraumbilical ventral hernia again seen containing only fat. No evidence of herniated bowel loops.  Musculoskeletal: No suspicious bone lesions identified. 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 Lower lumbar spine fusion hardware again noted.  IMPRESSION: No acute findings identified within the abdomen or pelvis.  No significant change in posterior uterine fibroid, small paraumbilical ventral hernia, and chronic avascular necrosis of both femoral heads.   Electronically Signed   By: Myles Rosenthal M.D.   On: 11/26/2014 18:16   NOTE: Her Infectious disease panel was negative for Yeast, Trich, gonorrhea,chlamydia,RPR Disposition:  01-Home or Self Care     Medication List    STOP taking these medications        arformoterol 15 MCG/2ML Nebu  Commonly known as:  BROVANA     tiotropium 18 MCG inhalation capsule  Commonly known as:  SPIRIVA      TAKE these medications        ADVAIR DISKUS 250-50 MCG/DOSE Aepb  Generic drug:  Fluticasone-Salmeterol  Inhale 2 puffs into the lungs 2 (two) times daily.     albuterol (2.5 MG/3ML) 0.083% nebulizer solution  Commonly known as:  PROVENTIL  Take 3 mLs by nebulization every 2 (two) hours as needed for wheezing or shortness of breath.     PROAIR HFA 108 (90 BASE) MCG/ACT inhaler  Generic drug:  albuterol  Inhale 1-2 puffs into the lungs every 4 (four) hours as needed.     ALPRAZolam 1 MG tablet  Commonly known as:  XANAX  Take 1 tablet (1 mg total) by mouth 3 (three) times daily as needed for anxiety.     azelastine 0.1 % nasal spray  Commonly known as:  ASTELIN  Place 2 sprays into both nostrils 2 (two) times daily. Use in each nostril as directed     budesonide 0.5 MG/2ML nebulizer solution  Commonly known as:  PULMICORT  Take 2 mLs (0.5 mg total) by nebulization 2 (two) times daily.      ENSURE  Take 237 mLs by mouth 3 (three) times daily between meals.     fentaNYL 75 MCG/HR  Commonly known as:  DURAGESIC - dosed mcg/hr  Place 75 mcg onto the skin every 3 (three) days.     fluticasone 50 MCG/ACT nasal spray  Commonly known as:  FLONASE  Place 2 sprays into both nostrils 2 (two) times daily.     ipratropium-albuterol 0.5-2.5 (3) MG/3ML Soln  Commonly known as:  DUONEB  Take 3 mLs by nebulization 2 (two) times daily.     morphine 30 MG 12 hr tablet  Commonly known as:  MS CONTIN  Take 1 tablet (30 mg total) by mouth every 12 (twelve) hours.     MULTIVITAMIN ADULT PO  Take 1 tablet by mouth daily.     oxyCODONE 15 MG immediate release tablet  Commonly known as:  ROXICODONE  Take 1 tablet (15 mg total) by mouth every 6 (six) hours as needed for moderate pain or breakthrough pain.  pantoprazole 40 MG tablet  Commonly known as:  PROTONIX  Take 1 tablet (40 mg total) by mouth daily at 12 noon.     predniSONE 10 MG tablet  Commonly known as:  DELTASONE  Take 4 tabs  daily with food x 4 days, then 3 tabs daily x 4 days, then 2 tabs daily x 4 days, then 1 tab daily x4 days then stop. #40     pregabalin 25 MG capsule  Commonly known as:  LYRICA  Take 1 capsule (25 mg total) by mouth 2 (two) times daily.     promethazine-codeine 6.25-10 MG/5ML syrup  Commonly known as:  PHENERGAN with CODEINE  Take 5 mLs by mouth every 6 (six) hours as needed for cough.     zolpidem 5 MG tablet  Commonly known as:  AMBIEN  Take 1 tablet (5 mg total) by mouth at bedtime.          Discharged Condition: fair  Time spent on discharge greater than 40 minutes.  Vital signs at Discharge. Temp:  [97.7 F (36.5 C)-98.5 F (36.9 C)] 98.2 F (36.8 C) (04/14 0817) Pulse Rate:  [68-94] 74 (04/14 0817) Resp:  [15-30] 20 (04/14 0817) BP: (102-123)/(47-85) 111/71 mmHg (04/14 0817) SpO2:  [99 %-100 %] 100 % (04/14 0817) Weight:  [169 lb 5 oz (76.8 kg)] 169 lb 5 oz (76.8 kg)  (04/14 0600) Office follow up Special Information or instructions.   She will see Tammy Parret ANP. Her medications are extensive and with multiple overlaps. She will need medication calendar and simplification of pharmaceutical regime.     Signed: Brett Canales Maruice Pieroni ACNP [facility] PCCM Pager [phone] till 3 pm If no answer page [phone] 11/27/2014, 9:43 AM

## 2014-12-11 ENCOUNTER — Inpatient Hospital Stay: Payer: Medicaid Other | Admitting: Adult Health

## 2015-01-19 ENCOUNTER — Ambulatory Visit (INDEPENDENT_AMBULATORY_CARE_PROVIDER_SITE_OTHER): Payer: Medicaid Other | Admitting: Internal Medicine

## 2015-01-19 ENCOUNTER — Encounter: Payer: Self-pay | Admitting: Internal Medicine

## 2015-01-19 DIAGNOSIS — R058 Other specified cough: Secondary | ICD-10-CM

## 2015-01-19 DIAGNOSIS — R05 Cough: Secondary | ICD-10-CM

## 2015-01-19 MED ORDER — AMOXICILLIN-POT CLAVULANATE 875-125 MG PO TABS: 1.0000 | ORAL_TABLET | Freq: Two times a day (BID) | ORAL | Status: DC

## 2015-01-19 MED ORDER — PREDNISONE 10 MG PO TABS: ORAL_TABLET | ORAL | Status: DC

## 2015-01-19 MED ORDER — PROMETHAZINE-CODEINE 6.25-10 MG/5ML PO SYRP: 5.0000 mL | ORAL_SOLUTION | Freq: Four times a day (QID) | ORAL | Status: DC | PRN

## 2015-01-19 NOTE — Patient Instructions (Signed)
Augmentin 875 mg take one pill twice daily  X 10 days - take at breakfast and supper with large glass of water.  It would help reduce the usual side effects (diarrhea and yeast infections) if you ate cultured yogurt at lunch.   Prednisone 10 mg take  4 each am x 2 days,   2 each am x 2 days,  1 each am x 2 days and stop   Work on inhaler technique:  relax and gently blow all the way out then take a nice smooth deep breath back in, triggering the inhaler at same time you start breathing in.  Hold for up to 5 seconds if you can.  Rinse and gargle with water when done    Resume dulera 100 Take 2 puffs first thing in am and then another 2 puffs about 12 hours later.   For cough > phenergan vc with codeine up to 2 tsp every 4 hours  If condition worsens go to ER    See Tammy NP w/in 2 weeks with all your medications, even over the counter meds, separated in two separate bags, the ones you take no matter what vs the ones you stop once you feel better and take only as needed when you feel you need them.   Tammy  will generate for you a new user friendly medication calendar that will put us all on the same page re: your medication use.     Without this process, it simply isn't possible to assure that we are providing  your outpatient care  with  the attention to detail we feel you deserve.   If we cannot assure that you're getting that kind of care,  then we cannot manage your problem effectively from this clinic.  Once you have seen Tammy and we are sure that we're all on the same page with your medication use she will arrange follow up with me.

## 2015-01-19 NOTE — Progress Notes (Signed)
Subjective:    Patient ID: Julie Kerr, female    DOB: 05/05/1976    MRN: [id number]    1934 yobf with asthma since age 89 on inhalers daily quit smoking  06/2014 but if anything worse since   Referred to pulmonary clinc 12/04/13  by [facility] with suspected vcd and ultimately required trach 07/31/14     12/04/2013 1st [facility] office visit/ [facility]  Chief Complaint  Patient presents with  . Pulmonary Consult    Referred per Dr. Blake DivineAkula. Pt reports dx of COPD approx 10 yrs ago. She c/o increased SOB and cough x 5 days. SOB bothers her all of the time- made worse by anxiety.  Cough is prod occ with minimal yellow/blood tinged sputum.  She is using rescue inhaler on average 6 x per day and using albuterol neb 4 x per day.  last did well for a couple of weeks while on "strong cough med with codeine" = tussionex per d/c instructions plus xanax - prednisone and inhalers have stopped working for her Last used neb around 1.5 h prior to OV    02/24/2014 Acute OV /NP Pt returns for persistent cough , congestion, barking cough and wheezing . Complains of  Chest congestion, sinus pressure, PND, sore throat and ear pain. Complains of asthma flare for last few days.  Was admitted 2 weeks for similar symptoms . Tx w/ IV steroids and Nebs. No better with treatment. Feels she  Cant stop wheezing or coughing.  Very anxious and upset today stating she is out of her pain meds and anxiety meds.  Unfortunately she continues to smoker.  She does not have her inhalers -Dulera.  She has Advair and [place] which are old. And she was discharged on Dulera from hospital 2 weeks ago.  Pt has difficulty with VCD complicated by anxiety and chronic pain .  She has a barking cough and upper airway wheezing throughout exam unless she is talking then at times it goes away.  Does not improve with Xopenex neb in office. rec Restart Dulera 100/65mcg 2 puffs Twice daily  , rinse after use.  Stop  Advair and Tudorza.  Discuss with family doctor regarding pain meds and anxiety meds .    Admit date: 07/25/2014 Discharge date: 08/06/2014  Discharge Diagnoses:  Principal Problem:  Acute respiratory failure with hypoxia   Chronic pain syndrome  Upper airway cough syndrome, severe, with clinical VCD  HCAP (healthcare-associated pneumonia)  Polysubstance abuse  Metabolic acidosis  Asthma exacerbation  Acute respiratory failure  Cough  Respiratory failure  Tracheostomy placed 07/31/14    Detailed Hospital Course:  39 y/o woman w/ hx of asthma (s/p intubation x3), tobacco abuse, polysubstance abuse, and chronic pain who presented to the ED 12/11 with worsening dyspnea that she thought was due to an asthma exacerbation. The patient was reportedly in her usual state of health until mid-day when she felt more dyspnic. This progressed until her partner came home at around 16:45 and she was significantly more dyspneic, and was taken to the ED. She was found to be acidotic, and was placed on BiPAP to help correct this, but she did not tolerate the mask and was intubated. She was admitted to ICU under PCCM service. By 12/12 AM she was successfully weaning from vent and was bale to be extubated. She was transferred to floor. 12/14 she had additional respiratory distress which was all upper airway and related to VCD. She was placed  on BiPAP and treated for stridor and symptoms improved. 12/15 she again developed upper airway distress and required intubation once again. She was not able to wean and had percutaneous tracheostomy placed 12/17. She was quickly able to tolerate trach collar and eventually PMV. She is managed at pain clinic and was started on her home opiate regimen at a reduced dose which she is tolerating well. Had trach changed to 4 cuffless shiley 12/22. 12/23 she is deemed a candidate for discharge as she has achieved maximum benefit from inpatient care.     08/20/2014 post hosp f/u ov/[facility]ert re:  Chief Complaint  Patient presents with  . Acute Visit    Pt states that she is having increased cough and SOB since she ran out of xanax and oxycodone 3 days ago. She feels like trach is causing alot of pressure in her throat. Sputum is clear. She has had fever for the past 2 days. She c/o pain in her back and her legs.   no purulent sputum, n or v  Does fine with pmv out, loud pseudowheeze with it in  rec Use humidity and 02 at bedtime Dulera 100 Take 2 puffs first thing in am and then another 2 puffs about 12 hours later.  During the day if get short of breath and you can't recover then use your proair  first and if not effective then use your nebulizer up to every 4 hours See Tammy NP w/in 2 weeks  Once you have seen Tammy and we are sure that we're all on the same page with your medication use she will arrange follow up with me.  Note given dulera 100 #62 puffs check counter on return  Needs cxr on return if not doing better > did not return to do med cal / validate taking  rx    01/19/2015 f/u ov/[facility] re: acute exac asthma vs vcd, trach out x months, does not know doctors name  Chief Complaint  Patient presents with  . Follow-up    PT c/o prod cough with yellow/green thick mucus, PND, sinus pressure, chest congestion/tightness. Increased SOB and wheezing. Fever and chills x 1 week. Left sided arm numbnessx x 3 days.  requesting narcotics and analgesics "out for a month"  And symptoms have gradually worsened since.    No obvious day to day or daytime variabilty or assoc  cp or   overt  hb symptoms. No unusual exp hx or h/o childhood pna/ asthma or knowledge of premature birth.  Sleeping ok without nocturnal  or early am exacerbation  of respiratory  c/o's or need for noct saba. Also denies any obvious fluctuation of symptoms with weather or environmental changes or other aggravating or alleviating factors except as outlined above   Current  Medications, Allergies, Complete Past Medical History, Past Surgical History, Family History, and Social History were reviewed in [employer] record.  ROS  The following are not active complaints unless bolded sore throat, dysphagia, dental problems, itching, sneezing,  nasal congestion or excess/ purulent secretions, ear ache,   fever, chills, sweats, unintended wt loss, pleuritic or exertional cp, hemoptysis,  orthopnea pnd or leg swelling, presyncope, palpitations, heartburn, abdominal pain, anorexia, nausea, vomiting, diarrhea  or change in bowel or urinary habits, change in stools or urine, dysuria,hematuria,  rash, arthralgias, visual complaints, headache, numbness weakness or ataxia or problems with walking or coordination,  change in mood/affect or memory.          Objective:  Physical Exam  amb  Mod obese bf nad / marked pseudowheeze   01/26/2015        180  Wt Readings from Last 3 Encounters:  08/20/14 179 lb (81.194 kg)  08/05/14 188 lb 7.9 oz (85.5 kg)  06/02/14 176 lb 12.8 oz (80.196 kg)    Vital signs reviewed      HEENT: nl dentition, turbinates, and orophanx. Nl external ear canals without cough reflex   NECK :  without JVD/Nodes/TM/ nl carotid upstrokes bilaterally/ trach site well healed (wound looks months old)    LUNGS: no acc muscle use, mainly clear w/ pursed lip exhalation , however loud referred upper airway noise  CV:  RRR  no s3 or murmur or increase in P2, no edema   ABD:  soft and nontender with nl excursion in the supine position. No bruits or organomegaly, bowel sounds nl  MS:  warm without deformities, calf tenderness, cyanosis or clubbing  SKIN: warm and dry without lesions  , several tatoos.   NEURO:  alert, approp, no deficits      I personally reviewed images and agree with radiology impression as follows:  CXR:   11/24/14 Hypoventilatory chest, no acute process     Assessment & Plan:

## 2015-01-26 ENCOUNTER — Encounter: Payer: Self-pay | Admitting: Internal Medicine

## 2015-01-26 NOTE — Assessment & Plan Note (Signed)
-   left AMA 01/20/14 with admit due to sob when pain meds denied/ offered help by psych/ social worker/chaplain>  was not forthcoming with accurate med hx - resolved with Trach 07/31/14  - recurrnt 01/19/15 with trach out ? When ? Which ENT (pt not forthcoming about either)  With trach back out it's very very difficult to sort out asthma vs pseudoasthma but I'm leaning again based on hx and exam strongly toward the latter  I had an extended discussion with the patient reviewing all relevant studies completed to date and  lasting 15 to 20 minutes of a 25 minute visit on the following ongoing concerns:  1) best to use mildest ics here > try dulera 100 Take 2 puffs first thing in am and then another 2 puffs about 12 hours later.    2) The proper method of use, as well as anticipated side effects, of a metered-dose inhaler are discussed and demonstrated to the patient. Improved effectiveness after extensive coaching during this visit to a level of approximately  75%   3) Each maintenance medication was reviewed in detail including most importantly the difference between maintenance and as needed and under what circumstances the prns are to be used.  Please see instructions for details which were reviewed in writing and the patient given a copy.   4) urgently needs med reconciliation >  To keep things simple, I have asked the patient to first separate medicines that are perceived as maintenance, that is to be taken daily "no matter what", from those medicines that are taken on only on an as-needed basis and I have given the patient examples of both, and then return to see our NP to generate a  detailed  medication calendar which should be followed until the next physician sees the patient and updates it.

## 2015-02-03 ENCOUNTER — Encounter: Payer: Self-pay | Admitting: Adult Health

## 2015-02-11 ENCOUNTER — Emergency Department (HOSPITAL_COMMUNITY)
Admission: EM | Admit: 2015-02-11 | Discharge: 2015-02-12 | Disposition: A | Discharge status: Home / Self Care | Payer: Medicare Other | Attending: Emergency Medicine | Admitting: Emergency Medicine

## 2015-02-11 ENCOUNTER — Emergency Department (HOSPITAL_COMMUNITY): Payer: Medicare Other

## 2015-02-11 ENCOUNTER — Encounter (HOSPITAL_COMMUNITY): Payer: Self-pay | Admitting: Emergency Medicine

## 2015-02-11 DIAGNOSIS — Z87891 Personal history of nicotine dependence: Secondary | ICD-10-CM | POA: Diagnosis not present

## 2015-02-11 DIAGNOSIS — J441 Chronic obstructive pulmonary disease with (acute) exacerbation: Secondary | ICD-10-CM | POA: Diagnosis not present

## 2015-02-11 DIAGNOSIS — R000 Tachycardia, unspecified: Secondary | ICD-10-CM | POA: Insufficient documentation

## 2015-02-11 DIAGNOSIS — J988 Other specified respiratory disorders: Secondary | ICD-10-CM | POA: Diagnosis not present

## 2015-02-11 DIAGNOSIS — I10 Essential (primary) hypertension: Secondary | ICD-10-CM | POA: Insufficient documentation

## 2015-02-11 DIAGNOSIS — E119 Type 2 diabetes mellitus without complications: Secondary | ICD-10-CM | POA: Insufficient documentation

## 2015-02-11 DIAGNOSIS — J398 Other specified diseases of upper respiratory tract: Secondary | ICD-10-CM | POA: Diagnosis not present

## 2015-02-11 DIAGNOSIS — R0602 Shortness of breath: Secondary | ICD-10-CM | POA: Diagnosis present

## 2015-02-11 DIAGNOSIS — R058 Other specified cough: Secondary | ICD-10-CM

## 2015-02-11 DIAGNOSIS — R011 Cardiac murmur, unspecified: Secondary | ICD-10-CM | POA: Diagnosis not present

## 2015-02-11 DIAGNOSIS — Z79899 Other long term (current) drug therapy: Secondary | ICD-10-CM | POA: Diagnosis not present

## 2015-02-11 DIAGNOSIS — R05 Cough: Secondary | ICD-10-CM

## 2015-02-11 DIAGNOSIS — F410 Panic disorder [episodic paroxysmal anxiety] without agoraphobia: Secondary | ICD-10-CM | POA: Insufficient documentation

## 2015-02-11 LAB — CBC
HCT: 37 % (ref 36.0–46.0)
Hemoglobin: 11.9 g/dL — ABNORMAL LOW (ref 12.0–15.0)
MCH: 25.3 pg — AB (ref 26.0–34.0)
MCHC: 32.2 g/dL (ref 30.0–36.0)
MCV: 78.6 fL (ref 78.0–100.0)
PLATELETS: 363 10*3/uL (ref 150–400)
RBC: 4.71 MIL/uL (ref 3.87–5.11)
RDW: 15.1 % (ref 11.5–15.5)
WBC: 7.2 10*3/uL (ref 4.0–10.5)

## 2015-02-11 LAB — BASIC METABOLIC PANEL
Anion gap: 7 (ref 5–15)
BUN: 9 mg/dL (ref 6–20)
CHLORIDE: 106 mmol/L (ref 101–111)
CO2: 25 mmol/L (ref 22–32)
CREATININE: 0.95 mg/dL (ref 0.44–1.00)
Calcium: 9.6 mg/dL (ref 8.9–10.3)
GFR calc non Af Amer: >60 mL/min (ref >60)
GLUCOSE: 104 mg/dL — AB (ref 65–99)
Potassium: 4.5 mmol/L (ref 3.5–5.1)
Sodium: 138 mmol/L (ref 135–145)

## 2015-02-11 MED ORDER — LORAZEPAM 2 MG/ML IJ SOLN
1.0000 mg | Freq: Once | INTRAMUSCULAR | Status: AC
  Administered 2015-02-11: 1 mg via INTRAVENOUS
  Filled 2015-02-11: qty 1

## 2015-02-11 MED ORDER — ALBUTEROL SULFATE (2.5 MG/3ML) 0.083% IN NEBU
2.5000 mg | INHALATION_SOLUTION | RESPIRATORY_TRACT | Status: AC
  Administered 2015-02-11: 2.5 mg via RESPIRATORY_TRACT
  Filled 2015-02-11: qty 3

## 2015-02-11 MED ORDER — IPRATROPIUM-ALBUTEROL 0.5-2.5 (3) MG/3ML IN SOLN
3.0000 mL | Freq: Once | RESPIRATORY_TRACT | Status: AC
  Administered 2015-02-11: 3 mL via RESPIRATORY_TRACT
  Filled 2015-02-11: qty 3

## 2015-02-11 MED ORDER — SODIUM CHLORIDE 0.9 % IN NEBU
3.0000 mL | INHALATION_SOLUTION | Freq: Three times a day (TID) | RESPIRATORY_TRACT | Status: DC | PRN
  Administered 2015-02-11: 3 mL via RESPIRATORY_TRACT
  Filled 2015-02-11: qty 3

## 2015-02-11 NOTE — ED Provider Notes (Signed)
Patient presented to the ER with difficulty breathing. Patient with history of severe anxiety as well as asthma/COPD. She reports that symptoms began earlier today.  Face to face Exam: HEENT - PERRLA Lungs - forced expiration and inspiration with upper airway residence and sounds. Heart - RRR, no M/R/G Abd - S/NT/ND Neuro - alert, oriented x3  Plan: Agent appears distressed on arrival, but appears to be forcing some of the upper airway sounds that are auscultated. She has good air movement in her lung fields. Patient treated with albuterol initially and then switched to saline nebulizers with improvement.  Gilda Creasehristopher J Clytie Shetley, MD 02/11/15 [id number]

## 2015-02-11 NOTE — ED Notes (Signed)
Pt is still anxious, tearful, and breathing forcefully.

## 2015-02-11 NOTE — ED Provider Notes (Signed)
CSN: [id number]     Arrival date & time 02/11/15  2144 History   First MD Initiated Contact with Patient 02/11/15 2243     Chief Complaint  Patient presents with  . Shortness of Breath     (Consider location/radiation/quality/duration/timing/severity/associated sxs/prior Treatment) Patient is a 39 y.o. female presenting with shortness of breath. The history is provided by the patient.  Shortness of Breath Severity:  Moderate Onset quality:  Gradual Duration:  2 days Timing:  Constant Progression:  Worsening Chronicity:  Recurrent Context: emotional upset   Relieved by:  Nothing Worsened by:  Nothing tried Ineffective treatments:  None tried Associated symptoms comment:  Diffuse pain, anxiety   Past Medical History  Diagnosis Date  . Asthma   . Anxiety   . COPD (chronic obstructive pulmonary disease)   . Bronchitis   . Heart murmur   . Shortness of breath   . Diabetes mellitus without complication   . Hypertension    Past Surgical History  Procedure Laterality Date  . Spinal fusion    . Tracheostomy  December 2015   Family History  Problem Relation Age of Onset  . HIV Mother   . Heart disease Father   . CVA Father   . Heart disease Other   . Emphysema Maternal Grandmother     smoked  . Asthma Sister   . Clotting disorder Sister   . Clotting disorder Maternal Grandmother   . Lung cancer Maternal Grandmother     smoked   History  Substance Use Topics  . Smoking status: Former Smoker -- 2.00 packs/day for 17 years    Types: Cigarettes    Quit date: 06/15/2014  . Smokeless tobacco: Never Used  . Alcohol Use: 0.0 oz/week    0 Standard drinks or equivalent per week     Comment: occasional   OB History    No data available     Review of Systems  Respiratory: Positive for shortness of breath.   All other systems reviewed and are negative.     Allergies  Robitussin dm; Nsaids; Rayon, purified; and Tramadol  Home Medications   Prior to Admission  medications   Medication Sig Start Date End Date Taking? Authorizing Provider  albuterol (PROVENTIL) (2.5 MG/3ML) 0.083% nebulizer solution Take 3 mLs by nebulization every 2 (two) hours as needed for wheezing or shortness of breath.  08/21/14   Historical Provider, MD  amoxicillin-clavulanate (AUGMENTIN) 875-125 MG per tablet Take 1 tablet by mouth 2 (two) times daily. 01/19/15   Nyoka Cowden, MD  arformoterol (BROVANA) 15 MCG/2ML NEBU Take 2 mLs (15 mcg total) by nebulization 2 (two) times daily. 10/31/14   Simonne Martinet, NP  azelastine (ASTELIN) 0.1 % nasal spray Place 2 sprays into both nostrils 2 (two) times daily. Use in each nostril as directed Patient not taking: Reported on 01/19/2015 10/31/14   Simonne Martinet, NP  budesonide (PULMICORT) 0.5 MG/2ML nebulizer solution Take 2 mLs (0.5 mg total) by nebulization 2 (two) times daily. 10/31/14   Simonne Martinet, NP  ENSURE (ENSURE) Take 237 mLs by mouth 3 (three) times daily between meals.    Historical Provider, MD  fluticasone (FLONASE) 50 MCG/ACT nasal spray Place 2 sprays into both nostrils 2 (two) times daily. Patient not taking: Reported on 01/19/2015 10/31/14   Simonne Martinet, NP  ipratropium-albuterol (DUONEB) 0.5-2.5 (3) MG/3ML SOLN Take 3 mLs by nebulization 2 (two) times daily.  10/22/14   Historical Provider, MD  morphine (MS  CONTIN) 30 MG 12 hr tablet Take 1 tablet (30 mg total) by mouth every 12 (twelve) hours. Patient taking differently: Take 30 mg by mouth every 8 (eight) hours.  09/01/14   Vassie Lollarlos Madera, MD  oxyCODONE (ROXICODONE) 15 MG immediate release tablet Take 1 tablet (15 mg total) by mouth every 6 (six) hours as needed for moderate pain or breakthrough pain. Patient taking differently: Take 30 mg by mouth every 6 (six) hours as needed for pain.  09/01/14   Vassie Lollarlos Madera, MD  pantoprazole (PROTONIX) 40 MG tablet Take 1 tablet (40 mg total) by mouth daily at 12 noon. Patient not taking: Reported on 01/19/2015 09/01/14   Vassie Lollarlos Madera,  MD  predniSONE (DELTASONE) 10 MG tablet Take  4 each am x 2 days,   2 each am x 2 days,  1 each am x 2 days and stop 01/19/15   Nyoka CowdenMichael B Wert, MD  pregabalin (LYRICA) 25 MG capsule Take 1 capsule (25 mg total) by mouth 2 (two) times daily. Patient not taking: Reported on 01/19/2015 09/01/14   Vassie Lollarlos Madera, MD  PROAIR HFA 108 (90 BASE) MCG/ACT inhaler Inhale 1-2 puffs into the lungs every 4 (four) hours as needed.  11/04/14   Historical Provider, MD  promethazine-codeine (PHENERGAN WITH CODEINE) 6.25-10 MG/5ML syrup Take 5 mLs by mouth every 6 (six) hours as needed for cough. 01/19/15   Nyoka CowdenMichael B Wert, MD   BP 133/116 mmHg  Pulse 105  Temp(Src) 98.6 F (37 C) (Oral)  Resp 29  SpO2 98%  LMP 02/04/2015 (Approximate) Physical Exam  Constitutional: She is oriented to person, place, and time. She appears well-developed and well-nourished. No distress.  HENT:  Head: Normocephalic.  Eyes: Conjunctivae are normal.  Neck: Neck supple. No tracheal deviation present.  Cardiovascular: Regular rhythm and normal heart sounds.  Tachycardia present.   Pulmonary/Chest: Accessory muscle usage (voluntary) present. No respiratory distress. She has no decreased breath sounds. She has no wheezes.  Transmitted upper airway sounds with inspiratory and expiratory phase, coughing, able to vocalize intermittently  Abdominal: Soft. She exhibits no distension.  Neurological: She is alert and oriented to person, place, and time. GCS eye subscore is 4. GCS verbal subscore is 5. GCS motor subscore is 6.  Skin: Skin is warm and dry.  Psychiatric: Her mood appears anxious.    ED Course  Procedures (including critical care time) Labs Review Labs Reviewed  BASIC METABOLIC PANEL - Abnormal; Notable for the following:    Glucose, Bld 104 (*)    All other components within normal limits  CBC - Abnormal; Notable for the following:    Hemoglobin 11.9 (*)    MCH 25.3 (*)    All other components within normal limits     Imaging Review Dg Chest Portable 1 View  02/11/2015   CLINICAL DATA:  Dyspnea.  EXAM: PORTABLE CHEST - 1 VIEW  COMPARISON:  11/24/2014  FINDINGS: There is mild crowding of the basilar markings due to a shallow inspiration. The lungs are clear. There is no large effusion. There is no pneumothorax. Hilar, mediastinal and cardiac contours are unremarkable and unchanged.  IMPRESSION: No acute cardiopulmonary findings.  Low lung volumes.   Electronically Signed   By: Ellery Plunkaniel R Mitchell M.D.   On: 02/11/2015 22:52     EKG Interpretation None      MDM   Final diagnoses:  Panic attacks  Upper airway cough syndrome, severe, with clinical VCD    39 year old female presents with recurrent vocal cord  dysfunction for which she has been followed by pulmonology and critical care here. She states that has been worsening over the last 2 days. She is protecting her airway appropriately and appears to be handling her secretions. She has coughing and very anxious on arrival but moving air appropriately. She is able to vocalize which may simply that this is not a true stridor or subglottic stenosis. She has been intubated multiple times previously for hypoxic respiratory failure related to his but she is not requiring oxygen now. She was initially started on albuterol nebulizer treatments after monitoring her she was transitioned to saline nebs and given antianxiety medications. She is complaining of diffuse pain but it appears she does have a history of substance abuse and I see no acute indication currently for narcotic administration. She is monitored in the emergency department on telemetry without significant change in her status over the next hour.  She was given 2 mg of IV Ativan, went to sleep and had no airway noises other than normal breath sounds. I woke her up and she had no airway noises, was able to speak in full sentences, told me that I needed to prescribe her Xanax has of her ongoing panic  attacks and her doctor being in jail. With her history of substance abuse and multiple prescribers on the narcotic database I did not feel comfortable prescribing controlled medication for outpatient use. I explained this to her and she asked to be discharged. I see no medical reason that she needs to stay in the hospital currently. Return precautions were discussed for worsening symptoms.  Lyndal Pulley, MD 02/12/15 [id number]  Gilda Crease, MD 02/12/15 [phone]

## 2015-02-11 NOTE — ED Notes (Signed)
Pt appears to be in respiratory distress. Pt is unable to speak. Pt is wheezing.

## 2015-02-12 NOTE — Discharge Instructions (Signed)
Cough, Adult   A cough is a reflex. It helps you clear your throat and airways. A cough can help heal your body. A cough can last 2 or 3 weeks (acute) or may last more than 8 weeks (chronic). Some common causes of a cough can include an infection, allergy, or a cold.  HOME CARE  · Only take medicine as told by your doctor.  · If given, take your medicines (antibiotics) as told. Finish them even if you start to feel better.  · Use a cold steam vaporizer or humidifier in your home. This can help loosen thick spit (secretions).  · Sleep so you are almost sitting up (semi-upright). Use pillows to do this. This helps reduce coughing.  · Rest as needed.  · Stop smoking if you smoke.  GET HELP RIGHT AWAY IF:  · You have yellowish-white fluid (pus) in your thick spit.  · Your cough gets worse.  · Your medicine does not reduce coughing, and you are losing sleep.  · You cough up blood.  · You have trouble breathing.  · Your pain gets worse and medicine does not help.  · You have a fever.  MAKE SURE YOU:   · Understand these instructions.  · Will watch your condition.  · Will get help right away if you are not doing well or get worse.  Document Released: 04/14/2011 Document Revised: 12/16/2013 Document Reviewed: 04/14/2011  [employer]® Patient Information ©2015 [employer]. This information is not intended to replace advice given to you by your health care provider. Make sure you discuss any questions you have with your health care provider.

## 2015-03-24 ENCOUNTER — Encounter (HOSPITAL_COMMUNITY): Payer: Self-pay

## 2015-03-24 ENCOUNTER — Inpatient Hospital Stay (HOSPITAL_COMMUNITY)
Admission: EM | Admit: 2015-03-24 | Discharge: 2015-03-28 | DRG: 208 | Disposition: A | Discharge status: Home / Self Care | Payer: Medicare Other | Attending: Family Medicine | Admitting: Family Medicine

## 2015-03-24 ENCOUNTER — Emergency Department (HOSPITAL_COMMUNITY): Payer: Medicare Other

## 2015-03-24 DIAGNOSIS — E1165 Type 2 diabetes mellitus with hyperglycemia: Secondary | ICD-10-CM | POA: Diagnosis present

## 2015-03-24 DIAGNOSIS — Z981 Arthrodesis status: Secondary | ICD-10-CM | POA: Diagnosis not present

## 2015-03-24 DIAGNOSIS — J209 Acute bronchitis, unspecified: Secondary | ICD-10-CM | POA: Diagnosis present

## 2015-03-24 DIAGNOSIS — R000 Tachycardia, unspecified: Secondary | ICD-10-CM | POA: Diagnosis present

## 2015-03-24 DIAGNOSIS — R0600 Dyspnea, unspecified: Secondary | ICD-10-CM | POA: Diagnosis not present

## 2015-03-24 DIAGNOSIS — J9600 Acute respiratory failure, unspecified whether with hypoxia or hypercapnia: Secondary | ICD-10-CM | POA: Diagnosis not present

## 2015-03-24 DIAGNOSIS — Z452 Encounter for adjustment and management of vascular access device: Secondary | ICD-10-CM | POA: Diagnosis not present

## 2015-03-24 DIAGNOSIS — R061 Stridor: Secondary | ICD-10-CM | POA: Diagnosis not present

## 2015-03-24 DIAGNOSIS — J45901 Unspecified asthma with (acute) exacerbation: Secondary | ICD-10-CM | POA: Diagnosis not present

## 2015-03-24 DIAGNOSIS — Z79899 Other long term (current) drug therapy: Secondary | ICD-10-CM

## 2015-03-24 DIAGNOSIS — I10 Essential (primary) hypertension: Secondary | ICD-10-CM | POA: Diagnosis present

## 2015-03-24 DIAGNOSIS — J9690 Respiratory failure, unspecified, unspecified whether with hypoxia or hypercapnia: Secondary | ICD-10-CM

## 2015-03-24 DIAGNOSIS — F410 Panic disorder [episodic paroxysmal anxiety] without agoraphobia: Secondary | ICD-10-CM | POA: Diagnosis present

## 2015-03-24 DIAGNOSIS — R0602 Shortness of breath: Secondary | ICD-10-CM | POA: Diagnosis not present

## 2015-03-24 DIAGNOSIS — K219 Gastro-esophageal reflux disease without esophagitis: Secondary | ICD-10-CM | POA: Diagnosis present

## 2015-03-24 DIAGNOSIS — F419 Anxiety disorder, unspecified: Secondary | ICD-10-CM | POA: Diagnosis not present

## 2015-03-24 DIAGNOSIS — F1120 Opioid dependence, uncomplicated: Secondary | ICD-10-CM | POA: Diagnosis present

## 2015-03-24 DIAGNOSIS — J441 Chronic obstructive pulmonary disease with (acute) exacerbation: Secondary | ICD-10-CM | POA: Diagnosis present

## 2015-03-24 DIAGNOSIS — J440 Chronic obstructive pulmonary disease with acute lower respiratory infection: Secondary | ICD-10-CM | POA: Diagnosis present

## 2015-03-24 DIAGNOSIS — Z792 Long term (current) use of antibiotics: Secondary | ICD-10-CM

## 2015-03-24 DIAGNOSIS — Z87891 Personal history of nicotine dependence: Secondary | ICD-10-CM | POA: Diagnosis not present

## 2015-03-24 DIAGNOSIS — Z7951 Long term (current) use of inhaled steroids: Secondary | ICD-10-CM | POA: Diagnosis not present

## 2015-03-24 DIAGNOSIS — R0603 Acute respiratory distress: Secondary | ICD-10-CM | POA: Insufficient documentation

## 2015-03-24 DIAGNOSIS — J9809 Other diseases of bronchus, not elsewhere classified: Secondary | ICD-10-CM | POA: Diagnosis not present

## 2015-03-24 DIAGNOSIS — Z4682 Encounter for fitting and adjustment of non-vascular catheter: Secondary | ICD-10-CM | POA: Diagnosis not present

## 2015-03-24 DIAGNOSIS — J383 Other diseases of vocal cords: Secondary | ICD-10-CM

## 2015-03-24 DIAGNOSIS — R062 Wheezing: Secondary | ICD-10-CM | POA: Diagnosis not present

## 2015-03-24 DIAGNOSIS — Z01818 Encounter for other preprocedural examination: Secondary | ICD-10-CM

## 2015-03-24 DIAGNOSIS — Z4659 Encounter for fitting and adjustment of other gastrointestinal appliance and device: Secondary | ICD-10-CM

## 2015-03-24 LAB — I-STAT TROPONIN, ED: TROPONIN I, POC: 0 ng/mL (ref 0.00–0.08)

## 2015-03-24 LAB — CBC
HCT: 34.5 % — ABNORMAL LOW (ref 36.0–46.0)
HEMOGLOBIN: 11 g/dL — AB (ref 12.0–15.0)
MCH: 25.2 pg — AB (ref 26.0–34.0)
MCHC: 31.9 g/dL (ref 30.0–36.0)
MCV: 78.9 fL (ref 78.0–100.0)
Platelets: 304 10*3/uL (ref 150–400)
RBC: 4.37 MIL/uL (ref 3.87–5.11)
RDW: 14.1 % (ref 11.5–15.5)
WBC: 12.6 10*3/uL — AB (ref 4.0–10.5)

## 2015-03-24 LAB — I-STAT ARTERIAL BLOOD GAS, ED
Acid-base deficit: 3 mmol/L — ABNORMAL HIGH (ref 0.0–2.0)
Bicarbonate: 22.2 mEq/L (ref 20.0–24.0)
O2 Saturation: 98 %
PH ART: 7.371 (ref 7.350–7.450)
PO2 ART: 108 mmHg — AB (ref 80.0–100.0)
Patient temperature: 98.6
TCO2: 23 mmol/L (ref 0–100)
pCO2 arterial: 38.4 mmHg (ref 35.0–45.0)

## 2015-03-24 LAB — CREATININE, SERUM
Creatinine, Ser: 0.97 mg/dL (ref 0.44–1.00)
GFR calc Af Amer: >60 mL/min (ref >60)

## 2015-03-24 LAB — I-STAT CHEM 8, ED
BUN: 10 mg/dL (ref 6–20)
CHLORIDE: 104 mmol/L (ref 101–111)
CREATININE: 0.9 mg/dL (ref 0.44–1.00)
Calcium, Ion: 1.21 mmol/L (ref 1.12–1.23)
Glucose, Bld: 123 mg/dL — ABNORMAL HIGH (ref 65–99)
HCT: 41 % (ref 36.0–46.0)
HEMOGLOBIN: 13.9 g/dL (ref 12.0–15.0)
POTASSIUM: 3.8 mmol/L (ref 3.5–5.1)
SODIUM: 141 mmol/L (ref 135–145)
TCO2: 20 mmol/L (ref 0–100)

## 2015-03-24 LAB — MRSA PCR SCREENING: MRSA by PCR: NEGATIVE

## 2015-03-24 LAB — GLUCOSE, CAPILLARY: Glucose-Capillary: 130 mg/dL — ABNORMAL HIGH (ref 65–99)

## 2015-03-24 LAB — BASIC METABOLIC PANEL
Anion gap: 9 (ref 5–15)
BUN: 9 mg/dL (ref 6–20)
CALCIUM: 9.3 mg/dL (ref 8.9–10.3)
CO2: 21 mmol/L — ABNORMAL LOW (ref 22–32)
CREATININE: 0.97 mg/dL (ref 0.44–1.00)
Chloride: 106 mmol/L (ref 101–111)
GFR calc Af Amer: >60 mL/min (ref >60)
GFR calc non Af Amer: >60 mL/min (ref >60)
Glucose, Bld: 124 mg/dL — ABNORMAL HIGH (ref 65–99)
Potassium: 3.9 mmol/L (ref 3.5–5.1)
SODIUM: 136 mmol/L (ref 135–145)

## 2015-03-24 LAB — PROTIME-INR
INR: 1.03 (ref 0.00–1.49)
Prothrombin Time: 13.7 seconds (ref 11.6–15.2)

## 2015-03-24 LAB — I-STAT CG4 LACTIC ACID, ED: Lactic Acid, Venous: 2.55 mmol/L (ref 0.5–2.0)

## 2015-03-24 MED ORDER — METHYLPREDNISOLONE SODIUM SUCC 125 MG IJ SOLR: 80.0000 mg | Freq: Two times a day (BID) | INTRAMUSCULAR | Status: DC

## 2015-03-24 MED ORDER — LORAZEPAM 2 MG/ML IJ SOLN
1.0000 mg | Freq: Once | INTRAMUSCULAR | Status: AC
  Administered 2015-03-24: 1 mg via INTRAVENOUS
  Filled 2015-03-24: qty 1

## 2015-03-24 MED ORDER — PANTOPRAZOLE SODIUM 40 MG PO TBEC: 40.0000 mg | DELAYED_RELEASE_TABLET | Freq: Every day | ORAL | Status: DC

## 2015-03-24 MED ORDER — LORAZEPAM 2 MG/ML IJ SOLN
INTRAMUSCULAR | Status: AC
  Filled 2015-03-24: qty 1

## 2015-03-24 MED ORDER — LORAZEPAM 2 MG/ML IJ SOLN
1.0000 mg | Freq: Once | INTRAMUSCULAR | Status: AC
  Administered 2015-03-24: 1 mg via INTRAVENOUS

## 2015-03-24 MED ORDER — SODIUM CHLORIDE 0.9 % IV SOLN: 250.0000 mL | INTRAVENOUS | Status: DC | PRN

## 2015-03-24 MED ORDER — ALBUTEROL SULFATE (2.5 MG/3ML) 0.083% IN NEBU: 2.5000 mg | INHALATION_SOLUTION | Freq: Four times a day (QID) | RESPIRATORY_TRACT | Status: DC

## 2015-03-24 MED ORDER — ARFORMOTEROL TARTRATE 15 MCG/2ML IN NEBU
15.0000 ug | INHALATION_SOLUTION | Freq: Two times a day (BID) | RESPIRATORY_TRACT | Status: DC
  Administered 2015-03-24 – 2015-03-28 (×8): 15 ug via RESPIRATORY_TRACT
  Filled 2015-03-24 (×10): qty 2

## 2015-03-24 MED ORDER — IPRATROPIUM-ALBUTEROL 0.5-2.5 (3) MG/3ML IN SOLN
3.0000 mL | Freq: Once | RESPIRATORY_TRACT | Status: AC
  Administered 2015-03-24: 3 mL via RESPIRATORY_TRACT
  Filled 2015-03-24: qty 3

## 2015-03-24 MED ORDER — EPINEPHRINE 0.3 MG/0.3ML IJ SOAJ
0.3000 mg | Freq: Once | INTRAMUSCULAR | Status: AC
  Administered 2015-03-24: 0.3 mg via INTRAMUSCULAR
  Filled 2015-03-24: qty 0.3

## 2015-03-24 MED ORDER — MAGNESIUM SULFATE 2 GM/50ML IV SOLN
2.0000 g | Freq: Once | INTRAVENOUS | Status: AC
  Administered 2015-03-24: 2 g via INTRAVENOUS
  Filled 2015-03-24: qty 50

## 2015-03-24 MED ORDER — METHYLPREDNISOLONE SODIUM SUCC 125 MG IJ SOLR
80.0000 mg | Freq: Two times a day (BID) | INTRAMUSCULAR | Status: DC
  Administered 2015-03-25: 80 mg via INTRAVENOUS
  Filled 2015-03-24 (×2): qty 1.28
  Filled 2015-03-24: qty 2

## 2015-03-24 MED ORDER — BUDESONIDE 0.5 MG/2ML IN SUSP
0.5000 mg | Freq: Two times a day (BID) | RESPIRATORY_TRACT | Status: DC
  Administered 2015-03-24 – 2015-03-28 (×8): 0.5 mg via RESPIRATORY_TRACT
  Filled 2015-03-24 (×9): qty 2

## 2015-03-24 MED ORDER — ALBUTEROL SULFATE (2.5 MG/3ML) 0.083% IN NEBU
INHALATION_SOLUTION | RESPIRATORY_TRACT | Status: AC
  Administered 2015-03-24: 16:00:00
  Filled 2015-03-24: qty 6

## 2015-03-24 MED ORDER — PREGABALIN 25 MG PO CAPS
25.0000 mg | ORAL_CAPSULE | Freq: Two times a day (BID) | ORAL | Status: DC
  Administered 2015-03-24 – 2015-03-28 (×8): 25 mg via ORAL
  Filled 2015-03-24 (×8): qty 1

## 2015-03-24 MED ORDER — RACEPINEPHRINE HCL 2.25 % IN NEBU
0.5000 mL | INHALATION_SOLUTION | Freq: Once | RESPIRATORY_TRACT | Status: AC
  Administered 2015-03-24: 0.5 mL via RESPIRATORY_TRACT
  Filled 2015-03-24: qty 0.5

## 2015-03-24 MED ORDER — FENTANYL CITRATE (PF) 100 MCG/2ML IJ SOLN
50.0000 ug | Freq: Once | INTRAMUSCULAR | Status: AC
  Administered 2015-03-24: 50 ug via INTRAVENOUS
  Filled 2015-03-24: qty 2

## 2015-03-24 MED ORDER — AZELASTINE HCL 0.1 % NA SOLN
2.0000 | Freq: Two times a day (BID) | NASAL | Status: DC
  Administered 2015-03-24 – 2015-03-28 (×5): 2 via NASAL
  Filled 2015-03-24: qty 30

## 2015-03-24 MED ORDER — ALBUTEROL (5 MG/ML) CONTINUOUS INHALATION SOLN
INHALATION_SOLUTION | RESPIRATORY_TRACT | Status: AC
  Administered 2015-03-24: 10 mg/h via RESPIRATORY_TRACT
  Filled 2015-03-24: qty 20

## 2015-03-24 MED ORDER — FLUTICASONE PROPIONATE 50 MCG/ACT NA SUSP
2.0000 | Freq: Two times a day (BID) | NASAL | Status: DC
  Administered 2015-03-24 – 2015-03-28 (×5): 2 via NASAL
  Filled 2015-03-24: qty 16

## 2015-03-24 MED ORDER — ALBUTEROL SULFATE (2.5 MG/3ML) 0.083% IN NEBU
2.5000 mg | INHALATION_SOLUTION | RESPIRATORY_TRACT | Status: DC | PRN
  Administered 2015-03-28: 2.5 mg via RESPIRATORY_TRACT
  Filled 2015-03-24: qty 3

## 2015-03-24 MED ORDER — LORAZEPAM 2 MG/ML IJ SOLN
0.5000 mg | INTRAMUSCULAR | Status: DC | PRN
  Administered 2015-03-24 – 2015-03-26 (×4): 1 mg via INTRAVENOUS
  Filled 2015-03-24 (×4): qty 1

## 2015-03-24 MED ORDER — FENTANYL CITRATE (PF) 100 MCG/2ML IJ SOLN
25.0000 ug | INTRAMUSCULAR | Status: DC | PRN
  Administered 2015-03-25 (×4): 50 ug via INTRAVENOUS
  Filled 2015-03-24 (×5): qty 2

## 2015-03-24 MED ORDER — ALBUTEROL SULFATE (2.5 MG/3ML) 0.083% IN NEBU
2.5000 mg | INHALATION_SOLUTION | Freq: Four times a day (QID) | RESPIRATORY_TRACT | Status: DC
  Administered 2015-03-25 – 2015-03-26 (×5): 2.5 mg via RESPIRATORY_TRACT
  Filled 2015-03-24 (×5): qty 3

## 2015-03-24 MED ORDER — IPRATROPIUM BROMIDE 0.02 % IN SOLN
0.5000 mg | Freq: Once | RESPIRATORY_TRACT | Status: AC
  Administered 2015-03-24: 0.5 mg via RESPIRATORY_TRACT
  Filled 2015-03-24: qty 2.5

## 2015-03-24 MED ORDER — ENOXAPARIN SODIUM 40 MG/0.4ML ~~LOC~~ SOLN
40.0000 mg | SUBCUTANEOUS | Status: DC
  Administered 2015-03-24 – 2015-03-27 (×3): 40 mg via SUBCUTANEOUS
  Filled 2015-03-24 (×5): qty 0.4

## 2015-03-24 MED ORDER — ALBUTEROL SULFATE (2.5 MG/3ML) 0.083% IN NEBU
5.0000 mg | INHALATION_SOLUTION | Freq: Once | RESPIRATORY_TRACT | Status: AC
  Administered 2015-03-24: 5 mg via RESPIRATORY_TRACT

## 2015-03-24 MED ORDER — ALBUTEROL SULFATE (2.5 MG/3ML) 0.083% IN NEBU
2.5000 mg | INHALATION_SOLUTION | RESPIRATORY_TRACT | Status: DC
  Administered 2015-03-24 (×2): 2.5 mg via RESPIRATORY_TRACT
  Filled 2015-03-24 (×2): qty 3

## 2015-03-24 MED ORDER — METHYLPREDNISOLONE SODIUM SUCC 125 MG IJ SOLR
INTRAMUSCULAR | Status: AC
  Filled 2015-03-24: qty 2

## 2015-03-24 MED ORDER — MORPHINE SULFATE ER 30 MG PO TBCR
30.0000 mg | EXTENDED_RELEASE_TABLET | Freq: Two times a day (BID) | ORAL | Status: DC
  Administered 2015-03-24 – 2015-03-25 (×2): 30 mg via ORAL
  Filled 2015-03-24 (×2): qty 1

## 2015-03-24 MED ORDER — METHYLPREDNISOLONE SODIUM SUCC 125 MG IJ SOLR
125.0000 mg | Freq: Once | INTRAMUSCULAR | Status: AC
  Administered 2015-03-24: 125 mg via INTRAVENOUS

## 2015-03-24 MED ORDER — DOXYCYCLINE HYCLATE 100 MG PO TABS
100.0000 mg | ORAL_TABLET | Freq: Two times a day (BID) | ORAL | Status: DC
  Administered 2015-03-24 – 2015-03-25 (×2): 100 mg via ORAL
  Filled 2015-03-24 (×3): qty 1

## 2015-03-24 MED ORDER — SODIUM CHLORIDE 0.9 % IV BOLUS (SEPSIS)
1000.0000 mL | Freq: Once | INTRAVENOUS | Status: AC
  Administered 2015-03-24: 1000 mL via INTRAVENOUS

## 2015-03-24 MED ORDER — ALBUTEROL (5 MG/ML) CONTINUOUS INHALATION SOLN
10.0000 mg/h | INHALATION_SOLUTION | Freq: Once | RESPIRATORY_TRACT | Status: AC
  Administered 2015-03-24: 10 mg/h via RESPIRATORY_TRACT
  Filled 2015-03-24: qty 20

## 2015-03-24 NOTE — ED Provider Notes (Signed)
CSN: [id number]     Arrival date & time 03/24/15  1514 History   First MD Initiated Contact with Patient 03/24/15 1531     Chief Complaint  Patient presents with  . Asthma     (Consider location/radiation/quality/duration/timing/severity/associated sxs/prior Treatment) HPI   Level V caveat- respiratory distress   PCP: No primary care provider on file. Blood pressure 188/100, pulse 87, SpO2 99 %.  Julie Kerr is a 39 y.o.female with a significant PMH of asthma, anxiety, COPD, bronchitis, heart murmur, SOB, hypertension presents to the ER with complaints of respiratory distress. She has been wheezing and coughing for the past 3 days. Reports temperature of 103 yesterday. Today she became short of breath, fell and hit her head. She believes that she passed out for unknown amount of time. When she woke up she was extremely short of breath, with stridor and severe anxiety. Her neighbor came by the house to ask for something saw she was in distress and brought her to the hospital.  Past Medical History  Diagnosis Date  . Asthma   . Anxiety   . COPD (chronic obstructive pulmonary disease)   . Bronchitis   . Heart murmur   . Shortness of breath   . Diabetes mellitus without complication   . Hypertension    Past Surgical History  Procedure Laterality Date  . Spinal fusion    . Tracheostomy  December 2015   Family History  Problem Relation Age of Onset  . HIV Mother   . Heart disease Father   . CVA Father   . Heart disease Other   . Emphysema Maternal Grandmother     smoked  . Asthma Sister   . Clotting disorder Sister   . Clotting disorder Maternal Grandmother   . Lung cancer Maternal Grandmother     smoked   History  Substance Use Topics  . Smoking status: Former Smoker -- 2.00 packs/day for 17 years    Types: Cigarettes    Quit date: 06/15/2014  . Smokeless tobacco: Never Used  . Alcohol Use: 0.0 oz/week    0 Standard drinks or equivalent per week     Comment:  occasional   OB History    No data available     Review of Systems  Level V caveat- respiratory distress  Allergies  Robitussin dm; Nsaids; Rayon, purified; and Tramadol  Home Medications   Prior to Admission medications   Medication Sig Start Date End Date Taking? Authorizing Provider  albuterol (PROVENTIL) (2.5 MG/3ML) 0.083% nebulizer solution Take 3 mLs by nebulization every 2 (two) hours as needed for wheezing or shortness of breath.  08/21/14   Historical Provider, MD  amoxicillin-clavulanate (AUGMENTIN) 875-125 MG per tablet Take 1 tablet by mouth 2 (two) times daily. 01/19/15   Nyoka Cowden, MD  arformoterol (BROVANA) 15 MCG/2ML NEBU Take 2 mLs (15 mcg total) by nebulization 2 (two) times daily. 10/31/14   Simonne Martinet, NP  azelastine (ASTELIN) 0.1 % nasal spray Place 2 sprays into both nostrils 2 (two) times daily. Use in each nostril as directed Patient not taking: Reported on 01/19/2015 10/31/14   Simonne Martinet, NP  budesonide (PULMICORT) 0.5 MG/2ML nebulizer solution Take 2 mLs (0.5 mg total) by nebulization 2 (two) times daily. 10/31/14   Simonne Martinet, NP  ENSURE (ENSURE) Take 237 mLs by mouth 3 (three) times daily between meals.    Historical Provider, MD  fluticasone (FLONASE) 50 MCG/ACT nasal spray Place 2 sprays into both  nostrils 2 (two) times daily. Patient not taking: Reported on 01/19/2015 10/31/14   Simonne Martinet, NP  ipratropium-albuterol (DUONEB) 0.5-2.5 (3) MG/3ML SOLN Take 3 mLs by nebulization 2 (two) times daily.  10/22/14   Historical Provider, MD  morphine (MS CONTIN) 30 MG 12 hr tablet Take 1 tablet (30 mg total) by mouth every 12 (twelve) hours. Patient taking differently: Take 30 mg by mouth every 8 (eight) hours.  09/01/14   Vassie Loll, MD  oxyCODONE (ROXICODONE) 15 MG immediate release tablet Take 1 tablet (15 mg total) by mouth every 6 (six) hours as needed for moderate pain or breakthrough pain. Patient taking differently: Take 30 mg by mouth every 6  (six) hours as needed for pain.  09/01/14   Vassie Loll, MD  pantoprazole (PROTONIX) 40 MG tablet Take 1 tablet (40 mg total) by mouth daily at 12 noon. Patient not taking: Reported on 01/19/2015 09/01/14   Vassie Loll, MD  predniSONE (DELTASONE) 10 MG tablet Take  4 each am x 2 days,   2 each am x 2 days,  1 each am x 2 days and stop 01/19/15   Nyoka Cowden, MD  pregabalin (LYRICA) 25 MG capsule Take 1 capsule (25 mg total) by mouth 2 (two) times daily. Patient not taking: Reported on 01/19/2015 09/01/14   Vassie Loll, MD  PROAIR HFA 108 (90 BASE) MCG/ACT inhaler Inhale 1-2 puffs into the lungs every 4 (four) hours as needed.  11/04/14   Historical Provider, MD  promethazine-codeine (PHENERGAN WITH CODEINE) 6.25-10 MG/5ML syrup Take 5 mLs by mouth every 6 (six) hours as needed for cough. 01/19/15   Nyoka Cowden, MD   BP 142/65 mmHg  Pulse 106  SpO2 100% Physical Exam  Constitutional: She appears well-developed and well-nourished. She appears ill. She appears distressed.  HENT:  Head: Normocephalic and atraumatic. Head is without raccoon's eyes, without Battle's sign, without abrasion, without contusion, without right periorbital erythema and without left periorbital erythema.  Mouth/Throat: Oropharynx is clear and moist.  Eyes: Pupils are equal, round, and reactive to light.  Neck: Normal range of motion. Neck supple.  Cardiovascular: Normal rate and regular rhythm.   Pulmonary/Chest: Accessory muscle usage present. She is in respiratory distress. She has decreased breath sounds. She has wheezes.  Difficult to hear lung sounds due to stridor and upper airway vocal cord dysfunction.  Abdominal: Soft. She exhibits no distension. There is no tenderness.  Neurological: She is alert.  Skin: Skin is warm. She is diaphoretic.  Nursing note and vitals reviewed.   ED Course  Procedures (including critical care time) Labs Review Labs Reviewed  BASIC METABOLIC PANEL - Abnormal; Notable for the  following:    CO2 21 (*)    Glucose, Bld 124 (*)    All other components within normal limits  I-STAT ARTERIAL BLOOD GAS, ED - Abnormal; Notable for the following:    pO2, Arterial 108.0 (*)    Acid-base deficit 3.0 (*)    All other components within normal limits  I-STAT CHEM 8, ED - Abnormal; Notable for the following:    Glucose, Bld 123 (*)    All other components within normal limits  I-STAT CG4 LACTIC ACID, ED - Abnormal; Notable for the following:    Lactic Acid, Venous 2.55 (*)    All other components within normal limits  PROTIME-INR  CBC WITH DIFFERENTIAL/PLATELET  Rosezena Sensor, ED   Imaging Review Dg Neck Soft Tissue  03/24/2015   CLINICAL DATA:  Three day history of wheezing.  EXAM: PORTABLE CHEST - 1 VIEW; NECK SOFT TISSUES - 1+ VIEW  COMPARISON:  02/11/2015  FINDINGS: The cardiac silhouette, mediastinal and hilar contours are within normal limits and stable. Slightly low lung volumes with mild peribronchial thickening and slight increased interstitial markings. This could suggest reactive airways disease or bronchitis. No focal infiltrates, edema or effusion. The bony thorax is intact.  IMPRESSION: Findings suggest bronchitis, reactive airways disease or interstitial pneumonitis but no focal infiltrates.   Electronically Signed   By: Rudie Meyer M.D.   On: 03/24/2015 16:17   Dg Chest Portable 1 View  03/24/2015   CLINICAL DATA:  Three day history of wheezing.  EXAM: PORTABLE CHEST - 1 VIEW; NECK SOFT TISSUES - 1+ VIEW  COMPARISON:  02/11/2015  FINDINGS: The cardiac silhouette, mediastinal and hilar contours are within normal limits and stable. Slightly low lung volumes with mild peribronchial thickening and slight increased interstitial markings. This could suggest reactive airways disease or bronchitis. No focal infiltrates, edema or effusion. The bony thorax is intact.  IMPRESSION: Findings suggest bronchitis, reactive airways disease or interstitial pneumonitis but no  focal infiltrates.   Electronically Signed   By: Rudie Meyer M.D.   On: 03/24/2015 16:17    EKG Interpretation   Date/Time:  Tuesday March 24 2015 15:22:10 EDT Ventricular Rate:  85 PR Interval:  160 QRS Duration: 74 QT Interval:  366 QTC Calculation: 435 R Axis:   65 Text Interpretation:  Normal sinus rhythm Normal ECG Artifact Confirmed by  Manus Gunning  MD, STEPHEN [id number]) on 03/24/2015 3:59:28 PM      MDM   Final diagnoses:  Stridor  Respiratory distress  Vocal cord dysfunction  Anxiety    Dr. Manus Gunning has seen patient early in encounter  Medications  albuterol (PROVENTIL) (2.5 MG/3ML) 0.083% nebulizer solution 5 mg (5 mg Nebulization Given 03/24/15 1522)  albuterol (PROVENTIL) (2.5 MG/3ML) 0.083% nebulizer solution (  Given 03/24/15 1530)  LORazepam (ATIVAN) injection 1 mg (1 mg Intravenous Given 03/24/15 1543)  EPINEPHrine (EPI-PEN) injection 0.3 mg (0.3 mg Intramuscular Given 03/24/15 1543)  methylPREDNISolone sodium succinate (SOLU-MEDROL) 125 mg/2 mL injection 125 mg (125 mg Intravenous Given 03/24/15 1544)  ipratropium-albuterol (DUONEB) 0.5-2.5 (3) MG/3ML nebulizer solution 3 mL (3 mLs Nebulization Given 03/24/15 1553)  Racepinephrine HCl 2.25 % nebulizer solution 0.5 mL (0.5 mLs Nebulization Given 03/24/15 1548)  albuterol (PROVENTIL,VENTOLIN) solution continuous neb (10 mg/hr Nebulization Given 03/24/15 1615)  ipratropium (ATROVENT) nebulizer solution 0.5 mg (0.5 mg Nebulization Given 03/24/15 1553)  magnesium sulfate IVPB 2 g 50 mL (2 g Intravenous New Bag/Given 03/24/15 1545)  LORazepam (ATIVAN) injection 1 mg (1 mg Intravenous Given 03/24/15 1553)  sodium chloride 0.9 % bolus 1,000 mL (1,000 mLs Intravenous New Bag/Given 03/24/15 1552)  fentaNYL (SUBLIMAZE) injection 50 mcg (50 mcg Intravenous Given 03/24/15 1623)   @ 3: 50 pm- pt re-evaluated, increasingly anxious, will give another dose of 1 mg Ativan. All of patients therapies have been initiated and Dr. Manus Gunning has touched base with  ENT.  @ 4:30 pm- Dr. Jenne Pane has seen the patient.   @ 4:51 pm- Dr. Manus Gunning consulted critical care for admission. They have agreed to come evaluate. Pt has refused BiPap, at this time she is sating at 100 % on cont neb. She may eventually require intubation but at this time she is tolerating neb treatments. Pt is very anxious.  Filed Vitals:   03/24/15 1645  BP: 142/65  Pulse: 106  Marlon Pel, PA-C 03/24/15 1655  Glynn Octave, MD 03/25/15 [phone]

## 2015-03-24 NOTE — ED Notes (Addendum)
Onset 3 days wheezing, shortness of breath.  Audible wheezing, grunting.  Pt reports has been using inhalers with no relief.  Unable to talk in complete sentences.

## 2015-03-24 NOTE — Procedures (Signed)
Preop diagnosis: Respiratory distress, vocal fold dysfunction, asthma Postop diagnosis: Same Procedure: Transnasal fiberoptic laryngoscopy Surgeon: Jenne Pane Anesth: Topical Compl: None Findings: There is no mass or edema of the larynx.  Vocal folds are moving paradoxically during respiration.  This pattern is broken to some degree with a sniff-hiss breathing technique.  Rhonchorous cough continues with the vocal folds wide open. Description: After discussing risks, benefits, and alternatives, the right nasal passage was sprayed with 4% lidocaine.  The fiberoptic laryngoscope was passed through the right nasal passage in order to evaluate the pharynx and larynx.  Voice and breathing commands were given and followed, aiding in evaluation.  After completion, the scope was removed and she was returned to nursing care with continued tachypnea and rhonchorous cough.

## 2015-03-24 NOTE — Consult Note (Signed)
Reason for Consult:Vocal fold dysfunction Referring Physician: ER  Julie Kerr is an 39 y.o. female.  HPI: 39 year old female with history of asthma, COPD, and vocal fold dysfunction who has required intubation at times in the past for these problems and tracheostomy at one point.  She no longer has a tracheostomy tube.  She presents to the ER with three days of difficulty breathing that worsened acutely today.  In the ER, she has been treated with steroids, racemic epinephrine, and albuterol but continues to struggle to breathe.  Oxygen saturation remains good.  Consultation was requested.  Past Medical History  Diagnosis Date  . Asthma   . Anxiety   . COPD (chronic obstructive pulmonary disease)   . Bronchitis   . Heart murmur   . Shortness of breath   . Diabetes mellitus without complication   . Hypertension     Past Surgical History  Procedure Laterality Date  . Spinal fusion    . Tracheostomy  December 2015    Family History  Problem Relation Age of Onset  . HIV Mother   . Heart disease Father   . CVA Father   . Heart disease Other   . Emphysema Maternal Grandmother     smoked  . Asthma Sister   . Clotting disorder Sister   . Clotting disorder Maternal Grandmother   . Lung cancer Maternal Grandmother     smoked    Social History:  reports that she quit smoking about 9 months ago. Her smoking use included Cigarettes. She has a 34 pack-year smoking history. She has never used smokeless tobacco. She reports that she drinks alcohol. She reports that she does not use illicit drugs.  Allergies:  Allergies  Allergen Reactions  . Robitussin Dm [Dextromethorphan-Guaifenesin] Nausea And Vomiting  . Nsaids Hives  . Rayon, Purified Hives  . Tramadol Hives    Medications: I have reviewed the patient's current medications.  Results for orders placed or performed during the hospital encounter of 03/24/15 (from the past 48 hour(s))  Basic metabolic panel     Status:  Abnormal   Collection Time: 03/24/15  3:30 PM  Result Value Ref Range   Sodium 136 135 - 145 mmol/L   Potassium 3.9 3.5 - 5.1 mmol/L   Chloride 106 101 - 111 mmol/L   CO2 21 (L) 22 - 32 mmol/L   Glucose, Bld 124 (H) 65 - 99 mg/dL   BUN 9 6 - 20 mg/dL   Creatinine, Ser 0.97 0.44 - 1.00 mg/dL   Calcium 9.3 8.9 - 10.3 mg/dL   GFR calc non Af Amer >60 >60 mL/min   GFR calc Af Amer >60 >60 mL/min    Comment: (NOTE) The eGFR has been calculated using the CKD EPI equation. This calculation has not been validated in all clinical situations. eGFR's persistently <60 mL/min signify possible Chronic Kidney Disease.    Anion gap 9 5 - 15  Protime-INR     Status: None   Collection Time: 03/24/15  3:30 PM  Result Value Ref Range   Prothrombin Time 13.7 11.6 - 15.2 seconds   INR 1.03 0.00 - 1.49  I-stat troponin, ED     Status: None   Collection Time: 03/24/15  3:47 PM  Result Value Ref Range   Troponin i, poc 0.00 0.00 - 0.08 ng/mL   Comment 3            Comment: Due to the release kinetics of cTnI, a negative result within  the first hours of the onset of symptoms does not rule out myocardial infarction with certainty. If myocardial infarction is still suspected, repeat the test at appropriate intervals.   I-stat chem 8, ed     Status: Abnormal   Collection Time: 03/24/15  3:49 PM  Result Value Ref Range   Sodium 141 135 - 145 mmol/L   Potassium 3.8 3.5 - 5.1 mmol/L   Chloride 104 101 - 111 mmol/L   BUN 10 6 - 20 mg/dL   Creatinine, Ser 0.90 0.44 - 1.00 mg/dL   Glucose, Bld 123 (H) 65 - 99 mg/dL   Calcium, Ion 1.21 1.12 - 1.23 mmol/L   TCO2 20 0 - 100 mmol/L   Hemoglobin 13.9 12.0 - 15.0 g/dL   HCT 41.0 36.0 - 46.0 %  I-Stat CG4 Lactic Acid, ED     Status: Abnormal   Collection Time: 03/24/15  3:49 PM  Result Value Ref Range   Lactic Acid, Venous 2.55 (HH) 0.5 - 2.0 mmol/L   Comment NOTIFIED PHYSICIAN   I-Stat Arterial Blood Gas, ED - (order at [facility] and [facility] only)     Status:  Abnormal   Collection Time: 03/24/15  4:02 PM  Result Value Ref Range   pH, Arterial 7.371 7.350 - 7.450   pCO2 arterial 38.4 35.0 - 45.0 mmHg   pO2, Arterial 108.0 (H) 80.0 - 100.0 mmHg   Bicarbonate 22.2 20.0 - 24.0 mEq/L   TCO2 23 0 - 100 mmol/L   O2 Saturation 98.0 %   Acid-base deficit 3.0 (H) 0.0 - 2.0 mmol/L   Patient temperature 98.6 F    Collection site RADIAL, ALLEN'S TEST ACCEPTABLE    Drawn by Operator    Sample type ARTERIAL     Dg Neck Soft Tissue  03/24/2015   CLINICAL DATA:  Three day history of wheezing.  EXAM: PORTABLE CHEST - 1 VIEW; NECK SOFT TISSUES - 1+ VIEW  COMPARISON:  02/11/2015  FINDINGS: The cardiac silhouette, mediastinal and hilar contours are within normal limits and stable. Slightly low lung volumes with mild peribronchial thickening and slight increased interstitial markings. This could suggest reactive airways disease or bronchitis. No focal infiltrates, edema or effusion. The bony thorax is intact.  IMPRESSION: Findings suggest bronchitis, reactive airways disease or interstitial pneumonitis but no focal infiltrates.   Electronically Signed   By: Marijo Sanes M.D.   On: 03/24/2015 16:17   Dg Chest Portable 1 View  03/24/2015   CLINICAL DATA:  Three day history of wheezing.  EXAM: PORTABLE CHEST - 1 VIEW; NECK SOFT TISSUES - 1+ VIEW  COMPARISON:  02/11/2015  FINDINGS: The cardiac silhouette, mediastinal and hilar contours are within normal limits and stable. Slightly low lung volumes with mild peribronchial thickening and slight increased interstitial markings. This could suggest reactive airways disease or bronchitis. No focal infiltrates, edema or effusion. The bony thorax is intact.  IMPRESSION: Findings suggest bronchitis, reactive airways disease or interstitial pneumonitis but no focal infiltrates.   Electronically Signed   By: Marijo Sanes M.D.   On: 03/24/2015 16:17    Review of Systems  Unable to perform ROS: medical condition   Blood pressure  141/124, pulse 106, SpO2 100 %. Physical Exam  Constitutional: She is oriented to person, place, and time. She appears well-developed and well-nourished.  In respiratory distress, anxious.  HENT:  Head: Normocephalic and atraumatic.  Right Ear: External ear normal.  Left Ear: External ear normal.  Nose: Nose normal.  Mouth/Throat: Oropharynx is  clear and moist.  Vocal quality is fairly normal.  Eyes: Conjunctivae and EOM are normal. Pupils are equal, round, and reactive to light.  Neck: Normal range of motion. Neck supple.  Tracheostomy scar.  Cardiovascular:  Tachycardic.  Respiratory:  Tachypneic.  Shallow breathing.  Rhonchorous cough with about every breath.  Musculoskeletal: Normal range of motion.  Neurological: She is alert and oriented to person, place, and time. No cranial nerve deficit.  Skin: Skin is warm and dry.    Assessment/Plan: Respiratory distress, asthma, vocal fold dysfunction Fiberoptic exam of the larynx was performed at the bedside.  See procedure note.  There is a component of paradoxical vocal fold movement that is improved with a sniff-hiss breathing pattern.  Even with the vocal folds open, the rhonchorous cough continues and appears to be coming from the deeper airway or lungs.  I discussed findings with the ER doctor who will discuss her case further with pulmonary/critical care.  If work of breathing does not improve, she may require intubation.  Blood gas and oxygen saturation is stable at present.  Deward Sebek 03/24/2015, 4:42 PM

## 2015-03-24 NOTE — H&P (Signed)
PULMONARY / CRITICAL CARE MEDICINE   Name: Julie Kerr MRN: [id number] DOB: 1976-02-27    ADMISSION DATE:  03/24/2015 CONSULTATION DATE:  03/24/2015  REFERRING MD :  EDP  CHIEF COMPLAINT:  Shortness of breath  INITIAL PRESENTATION: 39 year old female with a past medical history significant for asthmatic bronchitis, severe anxiety with vocal cord dysfunction who came to the emergency department on 03/24/2015 complaining of 4 days of shortness of breath. 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  STUDIES:    SIGNIFICANT EVENTS:    HISTORY OF PRESENT ILLNESS:  Mrs. Julie Kerr is well-known to our service with a long history of asthmatic bronchitis, recurrent hospitalizations for shortness of breath, and severe anxiety with vocal cord dysfunction. She came to the emergency department on 03/24/2015 complaining of 4 days of body aches, headaches, sinus congestion, nausea, vomiting, fever to 103, and increasing shortness of breath. This was associated with a cough which is not productive of mucus. She had generalized weakness as well. She denied sick contacts. She said that the dyspnea progressed to the point where was so severe she felt she needed to come to the emergency department. In the emergency department she has been treated with racemic epinephrine, albuterol, Solu-Medrol, and has been seen by ear nose and throat. ENT performed a video laryngoscopy which showed paradoxical vocal cord movement which improved with instruction. ENT felt however that there may be some lower airway cause of her wheezing as well. Pulmonary critical care medicine was consulted for further evaluation. She is requesting narcotic medications in the emergency department. Specifically, when I asked her what helps with cough she requests fentanyl.  PAST MEDICAL HISTORY :   has a past medical history of Asthma; Anxiety; COPD (chronic obstructive pulmonary  disease); Bronchitis; Heart murmur; Shortness of breath; Diabetes mellitus without complication; and Hypertension.  has past surgical history that includes Spinal fusion and Tracheostomy (December 2015). Prior to Admission medications   Medication Sig Start Date End Date Taking? Authorizing Provider  albuterol (PROVENTIL) (2.5 MG/3ML) 0.083% nebulizer solution Take 3 mLs by nebulization every 2 (two) hours as needed for wheezing or shortness of breath.  08/21/14   Historical Provider, MD  amoxicillin-clavulanate (AUGMENTIN) 875-125 MG per tablet Take 1 tablet by mouth 2 (two) times daily. 01/19/15   Nyoka Cowden, MD  arformoterol (BROVANA) 15 MCG/2ML NEBU Take 2 mLs (15 mcg total) by nebulization 2 (two) times daily. 10/31/14   Simonne Martinet, NP  azelastine (ASTELIN) 0.1 % nasal spray Place 2 sprays into both nostrils 2 (two) times daily. Use in each nostril as directed Patient not taking: Reported on 01/19/2015 10/31/14   Simonne Martinet, NP  budesonide (PULMICORT) 0.5 MG/2ML nebulizer solution Take 2 mLs (0.5 mg total) by nebulization 2 (two) times daily. 10/31/14   Simonne Martinet, NP  ENSURE (ENSURE) Take 237 mLs by mouth 3 (three) times daily between meals.    Historical Provider, MD  fluticasone (FLONASE) 50 MCG/ACT nasal spray Place 2 sprays into both nostrils 2 (two) times daily. Patient not taking: Reported on 01/19/2015 10/31/14   Simonne Martinet, NP  ipratropium-albuterol (DUONEB) 0.5-2.5 (3) MG/3ML SOLN Take 3 mLs by nebulization 2 (two) times daily.  10/22/14   Historical Provider, MD  morphine (MS CONTIN) 30 MG 12 hr tablet Take 1 tablet (30 mg total) by mouth every 12 (twelve) hours. Patient taking differently: Take 30 mg by mouth every 8 (eight) hours.  09/01/14  Vassie Loll, MD  oxyCODONE (ROXICODONE) 15 MG immediate release tablet Take 1 tablet (15 mg total) by mouth every 6 (six) hours as needed for moderate pain or breakthrough pain. Patient taking differently: Take 30 mg by mouth every  6 (six) hours as needed for pain.  09/01/14   Vassie Loll, MD  pantoprazole (PROTONIX) 40 MG tablet Take 1 tablet (40 mg total) by mouth daily at 12 noon. Patient not taking: Reported on 01/19/2015 09/01/14   Vassie Loll, MD  predniSONE (DELTASONE) 10 MG tablet Take  4 each am x 2 days,   2 each am x 2 days,  1 each am x 2 days and stop 01/19/15   Nyoka Cowden, MD  pregabalin (LYRICA) 25 MG capsule Take 1 capsule (25 mg total) by mouth 2 (two) times daily. Patient not taking: Reported on 01/19/2015 09/01/14   Vassie Loll, MD  PROAIR HFA 108 (90 BASE) MCG/ACT inhaler Inhale 1-2 puffs into the lungs every 4 (four) hours as needed.  11/04/14   Historical Provider, MD  promethazine-codeine (PHENERGAN WITH CODEINE) 6.25-10 MG/5ML syrup Take 5 mLs by mouth every 6 (six) hours as needed for cough. 01/19/15   Nyoka Cowden, MD   Allergies  Allergen Reactions  . Robitussin Dm [Dextromethorphan-Guaifenesin] Nausea And Vomiting  . Nsaids Hives  . Rayon, Purified Hives  . Tramadol Hives    FAMILY HISTORY:  indicated that her mother is deceased. She indicated that her father is deceased. She indicated that her other is deceased.  SOCIAL HISTORY:  reports that she quit smoking about 9 months ago. Her smoking use included Cigarettes. She has a 34 pack-year smoking history. She has never used smokeless tobacco. She reports that she drinks alcohol. She reports that she does not use illicit drugs.  REVIEW OF SYSTEMS:   Gen: + fever, denies chills, weight change, fatigue, night sweats HEENT: per HPI PULM: per HPI CV: Denies chest pain, edema, orthopnea, paroxysmal nocturnal dyspnea, palpitations GI: Denies abdominal pain, nausea, vomiting, diarrhea, hematochezia, melena, constipation, change in bowel habits GU: Denies dysuria, hematuria, polyuria, oliguria, urethral discharge Endocrine: Denies hot or cold intolerance, polyuria, polyphagia or appetite change Derm: Denies rash, dry skin, scaling or peeling  skin change Heme: Denies easy bruising, bleeding, bleeding gums Neuro: +  Headache, denies numbness, weakness, slurred speech, loss of memory or consciousness   SUBJECTIVE:   VITAL SIGNS: Pulse Rate:  [87-117] 108 (08/09 1715) BP: (106-188)/(61-124) 124/72 mmHg (08/09 1715) SpO2:  [97 %-100 %] 97 % (08/09 1715) HEMODYNAMICS:   VENTILATOR SETTINGS:   INTAKE / OUTPUT:  Intake/Output Summary (Last 24 hours) at 03/24/15 1735 Last data filed at 03/24/15 1725  Gross per 24 hour  Intake   1000 ml  Output      0 ml  Net   1000 ml    PHYSICAL EXAMINATION: General:  Severely anxious Neuro:  Awake and alert, oriented 4, moves all 4 extremities HEENT:  NCAT, old tracheostomy scar noted, neck supple Cardiovascular:  Slightly tachycardic, no murmurs gallops or rubs Lungs:  Upper airway wheezing/stridor which resolves when the patient chooses to talk to her partner, however, she indicates that she can't talk to me, on lengthy auscultation of her lungs there is good air movement when she manages to change her focus to another person in the room, there is no paradoxical abdominal movement, no accessory muscle use Abdomen:  Bowel sounds positive, nontender nondistended Musculoskeletal:  Normal bulk and tone Skin:  Tattoos noted, no rash  or skin breakdown  LABS:  CBC  Recent Labs Lab 03/24/15 1549  HGB 13.9  HCT 41.0   Coag's  Recent Labs Lab 03/24/15 1530  INR 1.03   BMET  Recent Labs Lab 03/24/15 1530 03/24/15 1549  NA 136 141  K 3.9 3.8  CL 106 104  CO2 21*  --   BUN 9 10  CREATININE 0.97 0.90  GLUCOSE 124* 123*   Electrolytes  Recent Labs Lab 03/24/15 1530  CALCIUM 9.3   Sepsis Markers  Recent Labs Lab 03/24/15 1549  LATICACIDVEN 2.55*   ABG  Recent Labs Lab 03/24/15 1602  PHART 7.371  PCO2ART 38.4  PO2ART 108.0*   Liver Enzymes No results for input(s): AST, ALT, ALKPHOS, BILITOT, ALBUMIN in the last 168 hours. Cardiac Enzymes No results  for input(s): TROPONINI, PROBNP in the last 168 hours. Glucose No results for input(s): GLUCAP in the last 168 hours.  Imaging 03/24/2015 chest x-ray images personally reviewed chronic bronchitis type changes, tracheal air column is normal, there is no infiltrate   ASSESSMENT / PLAN:  PULMONARY  A: Acute asthmatic bronchitis complicated by severe anxiety with vocal cord dysfunction: Currently not showing signs of respiratory failure, ABG is reassuringly normal, chest x-ray is normal, and nearly all symptoms go away when she manages to follow sleep. Therefore the vast majority of her symptoms this time are explained by vocal cord dysfunction with a likely upper respiratory infection causing laryngitis and maybe tracheobronchitis.  Stridor is transient and can go away when she chooses to talk or when she sleeps, this is consistent with vocal cord dysfunction P:   Monitor in intensive care unit Albuterol every 2 hours while awake for now, decrease is able Solu-Medrol 80 mg twice a day Doxycycline 100 mg twice a day O2 was needed Considering chronic narcotic dependence, hold on additional narcotic anti-tussive therapy  Ativan as needed for anxiety Continue Brovana and Pulmicort  NEUROLOGIC A:  Severe anxiety with panic attack Chronic narcotic dependence P:   Ativan as needed for anxiety Continue home MS Contin  Fentanyl as needed when necessary breakthrough pain  CARDIOVASCULAR  A: Sinus tachycardia, no signs of sepsis P:  Telemetry monitoring  RENAL A:  No acute issues P:   Monitor BMET and UOP Replace electrolytes as needed   GASTROINTESTINAL A:  Gastroesophageal reflux disease P:   Continue home PPI Regular diet  HEMATOLOGIC A:  No acute issues P:  Monitor for bleeding  INFECTIOUS A:  Acute tracheobronchitis in association with exacerbation of asthma / COPD P:   Doxycycline 100 mg by mouth twice a day 5 days  ENDOCRINE A:  No acute issues   P:    Monitor glucose    FAMILY  - Updates: son, girlfriend updated bedside  Heber Houck, MD [facility] Pager: [phone] Cell: [phone] After 3pm or if no response, call [phone]  03/24/2015, 5:35 PM

## 2015-03-25 ENCOUNTER — Inpatient Hospital Stay (HOSPITAL_COMMUNITY): Payer: Medicare Other

## 2015-03-25 DIAGNOSIS — J9600 Acute respiratory failure, unspecified whether with hypoxia or hypercapnia: Secondary | ICD-10-CM

## 2015-03-25 LAB — URINALYSIS, ROUTINE W REFLEX MICROSCOPIC
Bilirubin Urine: NEGATIVE
Glucose, UA: 250 mg/dL — AB
HGB URINE DIPSTICK: NEGATIVE
KETONES UR: NEGATIVE mg/dL
LEUKOCYTES UA: NEGATIVE
Nitrite: NEGATIVE
Protein, ur: NEGATIVE mg/dL
Specific Gravity, Urine: 1.013 (ref 1.005–1.030)
Urobilinogen, UA: 0.2 mg/dL (ref 0.0–1.0)
pH: 6 (ref 5.0–8.0)

## 2015-03-25 LAB — BASIC METABOLIC PANEL
Anion gap: 9 (ref 5–15)
BUN: 13 mg/dL (ref 6–20)
CO2: 22 mmol/L (ref 22–32)
Calcium: 9.3 mg/dL (ref 8.9–10.3)
Chloride: 104 mmol/L (ref 101–111)
Creatinine, Ser: 1.1 mg/dL — ABNORMAL HIGH (ref 0.44–1.00)
GFR calc Af Amer: >60 mL/min (ref >60)
GFR calc non Af Amer: >60 mL/min (ref >60)
Glucose, Bld: 238 mg/dL — ABNORMAL HIGH (ref 65–99)
Potassium: 3.9 mmol/L (ref 3.5–5.1)
Sodium: 135 mmol/L (ref 135–145)

## 2015-03-25 LAB — POCT I-STAT 3, ART BLOOD GAS (G3+)
Acid-base deficit: 1 mmol/L (ref 0.0–2.0)
Bicarbonate: 22.9 mEq/L (ref 20.0–24.0)
O2 Saturation: 98 %
PCO2 ART: 35.6 mmHg (ref 35.0–45.0)
PH ART: 7.416 (ref 7.350–7.450)
Patient temperature: 98.6
TCO2: 24 mmol/L (ref 0–100)
pO2, Arterial: 97 mmHg (ref 80.0–100.0)

## 2015-03-25 MED ORDER — METHYLPREDNISOLONE SODIUM SUCC 125 MG IJ SOLR
80.0000 mg | Freq: Two times a day (BID) | INTRAMUSCULAR | Status: DC
  Administered 2015-03-25 – 2015-03-27 (×4): 80 mg via INTRAVENOUS
  Filled 2015-03-25 (×4): qty 1.28

## 2015-03-25 MED ORDER — PREDNISONE 50 MG PO TABS
60.0000 mg | ORAL_TABLET | Freq: Every day | ORAL | Status: DC
  Filled 2015-03-25: qty 1

## 2015-03-25 MED ORDER — SODIUM CHLORIDE 0.9 % IV SOLN
INTRAVENOUS | Status: DC
  Administered 2015-03-25: 22:00:00 via INTRAVENOUS

## 2015-03-25 MED ORDER — MIDAZOLAM HCL 2 MG/2ML IJ SOLN
INTRAMUSCULAR | Status: AC
  Administered 2015-03-25: 2 mg
  Filled 2015-03-25: qty 2

## 2015-03-25 MED ORDER — DEXTROSE 5 % IV SOLN
500.0000 mg | INTRAVENOUS | Status: DC
  Administered 2015-03-25 – 2015-03-27 (×3): 500 mg via INTRAVENOUS
  Filled 2015-03-25 (×5): qty 500

## 2015-03-25 MED ORDER — PANTOPRAZOLE SODIUM 40 MG PO PACK
40.0000 mg | PACK | Freq: Every day | ORAL | Status: DC
  Administered 2015-03-25 – 2015-03-27 (×3): 40 mg
  Filled 2015-03-25 (×3): qty 20

## 2015-03-25 MED ORDER — MIDAZOLAM BOLUS VIA INFUSION
1.0000 mg | INTRAVENOUS | Status: DC | PRN
  Filled 2015-03-25: qty 2

## 2015-03-25 MED ORDER — SODIUM CHLORIDE 0.9 % IV SOLN
25.0000 ug/h | INTRAVENOUS | Status: DC
  Administered 2015-03-25: 50 ug/h via INTRAVENOUS
  Administered 2015-03-25: 100 ug/h via INTRAVENOUS
  Administered 2015-03-26: 250 ug/h via INTRAVENOUS
  Administered 2015-03-26: 125 ug/h via INTRAVENOUS
  Filled 2015-03-25 (×3): qty 50

## 2015-03-25 MED ORDER — ANTISEPTIC ORAL RINSE SOLUTION (CORINZ)
7.0000 mL | Freq: Four times a day (QID) | OROMUCOSAL | Status: DC
  Administered 2015-03-26 – 2015-03-27 (×6): 7 mL via OROMUCOSAL

## 2015-03-25 MED ORDER — FENTANYL CITRATE (PF) 100 MCG/2ML IJ SOLN: 50.0000 ug | Freq: Once | INTRAMUSCULAR | Status: DC

## 2015-03-25 MED ORDER — CHLORHEXIDINE GLUCONATE 0.12% ORAL RINSE (MEDLINE KIT)
15.0000 mL | Freq: Two times a day (BID) | OROMUCOSAL | Status: DC
  Administered 2015-03-25 – 2015-03-27 (×4): 15 mL via OROMUCOSAL

## 2015-03-25 MED ORDER — SODIUM CHLORIDE 0.9 % IV SOLN
0.0000 mg/h | INTRAVENOUS | Status: DC
  Administered 2015-03-25: 2 mg/h via INTRAVENOUS
  Administered 2015-03-25: 4 mg/h via INTRAVENOUS
  Administered 2015-03-25: 2 mg/h via INTRAVENOUS
  Administered 2015-03-25: 4 mg/h via INTRAVENOUS
  Filled 2015-03-25 (×3): qty 10

## 2015-03-25 MED ORDER — FENTANYL BOLUS VIA INFUSION
50.0000 ug | INTRAVENOUS | Status: DC | PRN
  Filled 2015-03-25: qty 50

## 2015-03-25 MED ORDER — FENTANYL CITRATE (PF) 100 MCG/2ML IJ SOLN
INTRAMUSCULAR | Status: AC
  Administered 2015-03-25: 100 ug
  Filled 2015-03-25: qty 4

## 2015-03-25 MED ORDER — MIDAZOLAM HCL 2 MG/2ML IJ SOLN
INTRAMUSCULAR | Status: AC
  Administered 2015-03-25: 2 mg
  Filled 2015-03-25: qty 4

## 2015-03-25 MED ORDER — RACEPINEPHRINE HCL 2.25 % IN NEBU
INHALATION_SOLUTION | RESPIRATORY_TRACT | Status: AC
  Administered 2015-03-25: 0.5 mL
  Filled 2015-03-25: qty 1

## 2015-03-25 MED ORDER — CEFTRIAXONE SODIUM 1 G IJ SOLR
1.0000 g | INTRAMUSCULAR | Status: DC
  Administered 2015-03-25 – 2015-03-27 (×3): 1 g via INTRAVENOUS
  Filled 2015-03-25 (×5): qty 10

## 2015-03-25 NOTE — Procedures (Signed)
Intubation Procedure Note Julie TiNaudia [id number] 07-Jul-1976  Procedure: Intubation Indications: Respiratory insufficiency  Procedure Details Consent: Risks of procedure as well as the alternatives and risks of each were explained to the (patient/caregiver).  Consent for procedure obtained. Time Out: Verified patient identification, verified procedure, site/side was marked, verified correct patient position, special equipment/implants available, medications/allergies/relevent history reviewed, required imaging and test results available.  Performed  3   Evaluation Hemodynamic Status: BP stable throughout; O2 sats: stable throughout Patient's Current Condition: stable Complications: No apparent complications Patient did tolerate procedure well. Chest X-ray ordered to verify placement.  CXR: tube position acceptable.  [name] MD [facility] Pager [phone] If no answer or after 3pm call: [phone] 03/25/2015, 6:18 PM

## 2015-03-25 NOTE — Progress Notes (Signed)
PULMONARY / CRITICAL CARE MEDICINE   Name: Julie Kerr MRN: [id number] DOB: 11-07-1975    ADMISSION DATE:  03/24/2015 REFERRING MD : EDP  CHIEF COMPLAINT: Shortness of breath  INITIAL PRESENTATION: 39 year old female with a past medical history significant for asthmatic bronchitis, severe anxiety with vocal cord dysfunction who came to the emergency department on 03/24/2015 complaining of 4 days of shortness of breath. 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   SIGNIFICANT EVENTS: 8/10: Pt had another episode of severe resp distress with stridor. Intubated.   HISTORY OF PRESENT ILLNESS: Julie Kerr is well-known to our service with a long history of asthmatic bronchitis, recurrent hospitalizations for shortness of breath, and severe anxiety with vocal cord dysfunction. She came to the emergency department on 03/24/2015 complaining of 4 days of body aches, headaches, sinus congestion, nausea, vomiting, fever to 103, and increasing shortness of breath. This was associated with a cough which is not productive of mucus. She had generalized weakness as well. She denied sick contacts. She said that the dyspnea progressed to the point where was so severe she felt she needed to come to the emergency department. In the emergency department she has been treated with racemic epinephrine, albuterol, Solu-Medrol, and has been seen by ear nose and throat. ENT performed a video laryngoscopy which showed paradoxical vocal cord movement which improved with instruction. ENT felt however that there may be some lower airway cause of her wheezing as well. Pulmonary critical care medicine was consulted for further evaluation. She is requesting narcotic medications in the emergency department. Specifically, when I asked her what helps with cough she requests fentanyl.  PAST MEDICAL HISTORY :   has a past medical history of Asthma; Anxiety; COPD  (chronic obstructive pulmonary disease); Bronchitis; Heart murmur; Shortness of breath; Diabetes mellitus without complication; and Hypertension.  has past surgical history that includes Spinal fusion and Tracheostomy (December 2015). Prior to Admission medications   Medication Sig Start Date End Date Taking? Authorizing Provider  albuterol (PROVENTIL) (2.5 MG/3ML) 0.083% nebulizer solution Take 3 mLs by nebulization every 2 (two) hours as needed for wheezing or shortness of breath.  08/21/14  Yes Historical Provider, MD  alprazolam Prudy Feeler) 2 MG tablet Take 2 mg by mouth 3 (three) times daily as needed for anxiety.   Yes Historical Provider, MD  arformoterol (BROVANA) 15 MCG/2ML NEBU Take 2 mLs (15 mcg total) by nebulization 2 (two) times daily. 10/31/14  Yes Simonne Martinet, NP  budesonide (PULMICORT) 0.5 MG/2ML nebulizer solution Take 2 mLs (0.5 mg total) by nebulization 2 (two) times daily. 10/31/14  Yes Simonne Martinet, NP  ENSURE (ENSURE) Take 237 mLs by mouth 3 (three) times daily between meals.   Yes Historical Provider, MD  ipratropium-albuterol (DUONEB) 0.5-2.5 (3) MG/3ML SOLN Take 3 mLs by nebulization 2 (two) times daily.  10/22/14  Yes Historical Provider, MD  morphine (MS CONTIN) 30 MG 12 hr tablet Take 1 tablet (30 mg total) by mouth every 12 (twelve) hours. Patient taking differently: Take 30 mg by mouth every 8 (eight) hours.  09/01/14  Yes Vassie Loll, MD  oxyCODONE (ROXICODONE) 15 MG immediate release tablet Take 1 tablet (15 mg total) by mouth every 6 (six) hours as needed for moderate pain or breakthrough pain. Patient taking differently: Take 30 mg by mouth every 6 (six) hours as needed for pain.  09/01/14  Yes Vassie Loll, MD  pantoprazole (PROTONIX) 40 MG tablet Take 1 tablet (  40 mg total) by mouth daily at 12 noon. 09/01/14  Yes Vassie Loll, MD  PROAIR HFA 108 [phone] BASE) MCG/ACT inhaler Inhale 1-2 puffs into the lungs every 4 (four) hours as needed.  11/04/14  Yes Historical  Provider, MD  promethazine-codeine (PHENERGAN WITH CODEINE) 6.25-10 MG/5ML syrup Take 5 mLs by mouth every 6 (six) hours as needed for cough. 01/19/15  Yes Nyoka Cowden, MD  zolpidem (AMBIEN) 10 MG tablet Take 10 mg by mouth at bedtime as needed for sleep.   Yes Historical Provider, MD  azelastine (ASTELIN) 0.1 % nasal spray Place 2 sprays into both nostrils 2 (two) times daily. Use in each nostril as directed Patient not taking: Reported on 01/19/2015 10/31/14   Simonne Martinet, NP  fluticasone [facility]) 50 MCG/ACT nasal spray Place 2 sprays into both nostrils 2 (two) times daily. Patient not taking: Reported on 01/19/2015 10/31/14   Simonne Martinet, NP  pregabalin (LYRICA) 25 MG capsule Take 1 capsule (25 mg total) by mouth 2 (two) times daily. Patient not taking: Reported on 01/19/2015 09/01/14   Vassie Loll, MD   Allergies  Allergen Reactions  . Robitussin Dm [Dextromethorphan-Guaifenesin] Nausea And Vomiting  . Nsaids Hives  . Rayon, Purified Hives  . Tramadol Hives    FAMILY HISTORY:  indicated that her mother is deceased. She indicated that her father is deceased. She indicated that her other is deceased.  SOCIAL HISTORY:  reports that she quit smoking about 9 months ago. Her smoking use included Cigarettes. She has a 34 pack-year smoking history. She has never used smokeless tobacco. She reports that she drinks alcohol. She reports that she does not use illicit drugs.   SUBJECTIVE:  No issues overnight.   VITAL SIGNS: Temp:  [98.7 F (37.1 C)-99.1 F (37.3 C)] 98.7 F (37.1 C) (08/10 0830) Pulse Rate:  [81-117] 94 (08/10 0900) Resp:  [17-28] 23 (08/10 0900) BP: (92-188)/(37-124) 118/60 mmHg (08/10 0900) SpO2:  [97 %-100 %] 99 % (08/10 0900) Weight:  [180 lb 12.4 oz (82 kg)] 180 lb 12.4 oz (82 kg) (08/10 0500) HEMODYNAMICS:   VENTILATOR SETTINGS:   INTAKE / OUTPUT:  Intake/Output Summary (Last 24 hours) at 03/25/15 1109 Last data filed at 03/25/15 0630  Gross per 24 hour   Intake   1360 ml  Output    850 ml  Net    510 ml    PHYSICAL EXAMINATION: General: Acute resp distress, increased WOB Neuro: Agitated HEENT:  PERRLA, moist mucus membranes. Stridor over throat Cardiovascular:  Sinus tachycardia. No MRG Lungs:  Scattered exp wheeze. Diffuse rhonchi Abdomen:  Soft, + BS Skin:  Intact  LABS:  CBC  Recent Labs Lab 03/24/15 1549 03/24/15 1940  WBC  --  12.6*  HGB 13.9 11.0*  HCT 41.0 34.5*  PLT  --  304   Coag's  Recent Labs Lab 03/24/15 1530  INR 1.03   BMET  Recent Labs Lab 03/24/15 1530 03/24/15 1549 03/24/15 1940 03/25/15 0215  NA 136 141  --  135  K 3.9 3.8  --  3.9  CL 106 104  --  104  CO2 21*  --   --  22  BUN 9 10  --  13  CREATININE 0.97 0.90 0.97 1.10*  GLUCOSE 124* 123*  --  238*   Electrolytes  Recent Labs Lab 03/24/15 1530 03/25/15 0215  CALCIUM 9.3 9.3   Sepsis Markers  Recent Labs Lab 03/24/15 1549  LATICACIDVEN 2.55*   ABG  Recent Labs Lab 03/24/15 1602  PHART 7.371  PCO2ART 38.4  PO2ART 108.0*   Liver Enzymes No results for input(s): AST, ALT, ALKPHOS, BILITOT, ALBUMIN in the last 168 hours. Cardiac Enzymes No results for input(s): TROPONINI, PROBNP in the last 168 hours. Glucose  Recent Labs Lab 03/24/15 2004  GLUCAP 130*    Imaging Dg Neck Soft Tissue  03/24/2015   CLINICAL DATA:  Three day history of wheezing.  EXAM: PORTABLE CHEST - 1 VIEW; NECK SOFT TISSUES - 1+ VIEW  COMPARISON:  02/11/2015  FINDINGS: The cardiac silhouette, mediastinal and hilar contours are within normal limits and stable. Slightly low lung volumes with mild peribronchial thickening and slight increased interstitial markings. This could suggest reactive airways disease or bronchitis. No focal infiltrates, edema or effusion. The bony thorax is intact.  IMPRESSION: Findings suggest bronchitis, reactive airways disease or interstitial pneumonitis but no focal infiltrates.   Electronically Signed   By: Rudie Meyer M.D.   On: 03/24/2015 16:17   Dg Chest Portable 1 View  03/24/2015   CLINICAL DATA:  Three day history of wheezing.  EXAM: PORTABLE CHEST - 1 VIEW; NECK SOFT TISSUES - 1+ VIEW  COMPARISON:  02/11/2015  FINDINGS: The cardiac silhouette, mediastinal and hilar contours are within normal limits and stable. Slightly low lung volumes with mild peribronchial thickening and slight increased interstitial markings. This could suggest reactive airways disease or bronchitis. No focal infiltrates, edema or effusion. The bony thorax is intact.  IMPRESSION: Findings suggest bronchitis, reactive airways disease or interstitial pneumonitis but no focal infiltrates.   Electronically Signed   By: Rudie Meyer M.D.   On: 03/24/2015 16:17     ASSESSMENT / PLAN:  PULMONARY  A: Acute asthmatic bronchitis complicated by severe anxiety with vocal cord dysfunction: Currently not showing signs of respiratory failure, ABG is reassuringly normal, chest x-ray is normal, and nearly all symptoms go away when she manages to follow sleep. Therefore the vast majority of her symptoms this time are explained by vocal cord dysfunction with a likely upper respiratory infection causing laryngitis and maybe tracheobronchitis.  Intubated on 8/10 for acute resp failure.  P:  Albuterol every 2 hours Continue Steroids Change doxy to ceftriaxone and azithro Check ABG  NEUROLOGIC A: Severe anxiety with panic attack Chronic narcotic dependence P:  Fentanyl and versed drip for sedation Hold home MS Contin    CARDIOVASCULAR  A: Sinus tachycardia, no signs of sepsis P:  Telemetry monitoring  RENAL A: No acute issues P:  Monitor BMET and UOP Replace electrolytes as needed   GASTROINTESTINAL A: Gastroesophageal reflux disease P:  Keep NPO for now. Will start tube feeds tomorrow.  HEMATOLOGIC A: No acute issues P:  Monitor for bleeding  INFECTIOUS A: Acute tracheobronchitis in association  with exacerbation of asthma / COPD P:  Doxycycline 8/9 >> 8/10 Ceftriaxone Start 8/10 Azithromycin Start 8/10 Follow sputum cultures  ENDOCRINE A: No acute issues  P:  Monitor glucose    FAMILY  - Updates: Girlfriend updated   TODAY'S SUMMARY: Had a worsening attack of stridor vs bronchospasm. Intubated   [name] MD [facility] Pager [phone] If no answer or after 3pm call: [phone] 03/25/2015, 5:27 PM

## 2015-03-25 NOTE — Progress Notes (Addendum)
eLink Physician-Brief Progress Note Patient Name: Julie Kerr DOB: 1976/04/05 MRN: [id number]   Date of Service  03/25/2015  HPI/Events of Note  Review of CXR to assess L IJ central venous line placement reveals L IJ central venous line tip to be in the R atrium. No evidence of pneumothorax.   eICU Interventions  Will contact on call team to withdraw the line from the R atrium and re-secure.         [place] Noori Dennard Nip 03/25/2015, 7:53 PM

## 2015-03-25 NOTE — Procedures (Signed)
Central Venous Catheter Insertion Procedure Note Julie Kerr [id number] 30-May-1976  Procedure: Insertion of Central Venous Catheter Indications: Drug and/or fluid administration and Frequent blood sampling  Procedure Details Consent: Risks of procedure as well as the alternatives and risks of each were explained to the (patient/caregiver).  Consent for procedure obtained. Time Out: Verified patient identification, verified procedure, site/side was marked, verified correct patient position, special equipment/implants available, medications/allergies/relevent history reviewed, required imaging and test results available.  Performed  Maximum sterile technique was used including antiseptics, cap, gloves, gown, hand hygiene, mask and sheet. Skin prep: Chlorhexidine; local anesthetic administered A antimicrobial bonded/coated triple lumen catheter was placed in the left internal jugular vein using the Seldinger technique.  Evaluation Blood flow good Complications: No apparent complications Patient did tolerate procedure well. Chest X-ray ordered to verify placement.  CXR: pending.  Julie Greathouse MD [place] [facility] Pager [phone] If no answer or after 3pm call: [phone] 03/25/2015, 6:16 PM

## 2015-03-26 LAB — CBC WITH DIFFERENTIAL/PLATELET
Basophils Absolute: 0 10*3/uL (ref 0.0–0.1)
Basophils Relative: 0 % (ref 0–1)
Eosinophils Absolute: 0 10*3/uL (ref 0.0–0.7)
Eosinophils Relative: 0 % (ref 0–5)
HCT: 31.7 % — ABNORMAL LOW (ref 36.0–46.0)
Hemoglobin: 10.2 g/dL — ABNORMAL LOW (ref 12.0–15.0)
LYMPHS ABS: 1.2 10*3/uL (ref 0.7–4.0)
Lymphocytes Relative: 6 % — ABNORMAL LOW (ref 12–46)
MCH: 25.9 pg — AB (ref 26.0–34.0)
MCHC: 32.2 g/dL (ref 30.0–36.0)
MCV: 80.5 fL (ref 78.0–100.0)
Monocytes Absolute: 0.7 10*3/uL (ref 0.1–1.0)
Monocytes Relative: 4 % (ref 3–12)
NEUTROS PCT: 90 % — AB (ref 43–77)
Neutro Abs: 17.4 10*3/uL — ABNORMAL HIGH (ref 1.7–7.7)
PLATELETS: 320 10*3/uL (ref 150–400)
RBC: 3.94 MIL/uL (ref 3.87–5.11)
RDW: 14.6 % (ref 11.5–15.5)
WBC: 19.3 10*3/uL — AB (ref 4.0–10.5)

## 2015-03-26 LAB — BASIC METABOLIC PANEL
Anion gap: 9 (ref 5–15)
BUN: 6 mg/dL (ref 6–20)
CALCIUM: 9.4 mg/dL (ref 8.9–10.3)
CHLORIDE: 104 mmol/L (ref 101–111)
CO2: 26 mmol/L (ref 22–32)
Creatinine, Ser: 0.82 mg/dL (ref 0.44–1.00)
GFR calc Af Amer: >60 mL/min (ref >60)
GFR calc non Af Amer: >60 mL/min (ref >60)
Glucose, Bld: 134 mg/dL — ABNORMAL HIGH (ref 65–99)
Potassium: 4.5 mmol/L (ref 3.5–5.1)
Sodium: 139 mmol/L (ref 135–145)

## 2015-03-26 MED ORDER — QUETIAPINE FUMARATE 50 MG PO TABS
100.0000 mg | ORAL_TABLET | Freq: Two times a day (BID) | ORAL | Status: DC
  Administered 2015-03-26 – 2015-03-28 (×5): 100 mg via ORAL
  Filled 2015-03-26: qty 1
  Filled 2015-03-26: qty 2
  Filled 2015-03-26: qty 1
  Filled 2015-03-26: qty 2
  Filled 2015-03-26 (×2): qty 1

## 2015-03-26 MED ORDER — MORPHINE SULFATE ER 15 MG PO TBCR
30.0000 mg | EXTENDED_RELEASE_TABLET | Freq: Two times a day (BID) | ORAL | Status: DC
  Administered 2015-03-26 – 2015-03-28 (×4): 30 mg via ORAL
  Filled 2015-03-26: qty 1
  Filled 2015-03-26 (×2): qty 2
  Filled 2015-03-26: qty 1

## 2015-03-26 MED ORDER — LEVALBUTEROL HCL 0.63 MG/3ML IN NEBU
0.6300 mg | INHALATION_SOLUTION | RESPIRATORY_TRACT | Status: DC
  Administered 2015-03-26 – 2015-03-28 (×11): 0.63 mg via RESPIRATORY_TRACT
  Filled 2015-03-26 (×15): qty 3

## 2015-03-26 MED ORDER — FENTANYL CITRATE (PF) 100 MCG/2ML IJ SOLN: 100.0000 ug | INTRAMUSCULAR | Status: DC | PRN

## 2015-03-26 MED ORDER — MIDAZOLAM HCL 2 MG/2ML IJ SOLN: 2.0000 mg | INTRAMUSCULAR | Status: DC | PRN

## 2015-03-26 MED ORDER — MORPHINE SULFATE ER 30 MG PO TBCR: 30.0000 mg | EXTENDED_RELEASE_TABLET | Freq: Two times a day (BID) | ORAL | Status: DC

## 2015-03-26 MED ORDER — ENSURE ENLIVE PO LIQD
237.0000 mL | Freq: Two times a day (BID) | ORAL | Status: DC
  Administered 2015-03-26 – 2015-03-28 (×4): 237 mL via ORAL
  Filled 2015-03-26 (×2): qty 237

## 2015-03-26 MED ORDER — FENTANYL CITRATE (PF) 100 MCG/2ML IJ SOLN
100.0000 ug | INTRAMUSCULAR | Status: DC | PRN
  Administered 2015-03-26 – 2015-03-27 (×10): 100 ug via INTRAVENOUS
  Filled 2015-03-26 (×10): qty 2

## 2015-03-26 MED ORDER — LORAZEPAM 2 MG/ML IJ SOLN
2.0000 mg | INTRAMUSCULAR | Status: DC | PRN
  Administered 2015-03-27 – 2015-03-28 (×5): 2 mg via INTRAVENOUS
  Filled 2015-03-26 (×6): qty 1

## 2015-03-26 MED ORDER — MIDAZOLAM HCL 2 MG/2ML IJ SOLN
2.0000 mg | INTRAMUSCULAR | Status: DC | PRN
  Filled 2015-03-26: qty 2

## 2015-03-26 NOTE — Progress Notes (Signed)
PULMONARY / CRITICAL CARE MEDICINE   Name: Julie Kerr MRN: [id number] DOB: 1976-05-19    ADMISSION DATE:  03/24/2015 REFERRING MD : EDP  CHIEF COMPLAINT: Shortness of breath  INITIAL PRESENTATION: 39 year old female with a past medical history significant for asthmatic bronchitis, severe anxiety with vocal cord dysfunction who came to the emergency department on 03/24/2015 complaining of 4 days of shortness of breath. 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   SIGNIFICANT EVENTS: 8/10: Pt had another episode of severe resp distress with stridor. Intubated.  8/11: Weaning well today, extubated  HISTORY OF PRESENT ILLNESS: Julie Kerr is well-known to our service with a long history of asthmatic bronchitis, recurrent hospitalizations for shortness of breath, and severe anxiety with vocal cord dysfunction. She came to the emergency department on 03/24/2015 complaining of 4 days of body aches, headaches, sinus congestion, nausea, vomiting, fever to 103, and increasing shortness of breath. This was associated with a cough which is not productive of mucus. She had generalized weakness as well. She denied sick contacts. She said that the dyspnea progressed to the point where was so severe she felt she needed to come to the emergency department. In the emergency department she has been treated with racemic epinephrine, albuterol, Solu-Medrol, and has been seen by ear nose and throat. ENT performed a video laryngoscopy which showed paradoxical vocal cord movement which improved with instruction. ENT felt however that there may be some lower airway cause of her wheezing as well. Pulmonary critical care medicine was consulted for further evaluation. She is requesting narcotic medications in the emergency department. Specifically, when I asked her what helps with cough she requests fentanyl.  PAST MEDICAL HISTORY :   has a past medical  history of Asthma; Anxiety; COPD (chronic obstructive pulmonary disease); Bronchitis; Heart murmur; Shortness of breath; Diabetes mellitus without complication; and Hypertension.  has past surgical history that includes Spinal fusion and Tracheostomy (December 2015). Prior to Admission medications   Medication Sig Start Date End Date Taking? Authorizing Provider  albuterol (PROVENTIL) (2.5 MG/3ML) 0.083% nebulizer solution Take 3 mLs by nebulization every 2 (two) hours as needed for wheezing or shortness of breath.  08/21/14  Yes Historical Provider, MD  alprazolam Prudy Feeler) 2 MG tablet Take 2 mg by mouth 3 (three) times daily as needed for anxiety.   Yes Historical Provider, MD  arformoterol (BROVANA) 15 MCG/2ML NEBU Take 2 mLs (15 mcg total) by nebulization 2 (two) times daily. 10/31/14  Yes Simonne Martinet, NP  budesonide (PULMICORT) 0.5 MG/2ML nebulizer solution Take 2 mLs (0.5 mg total) by nebulization 2 (two) times daily. 10/31/14  Yes Simonne Martinet, NP  ENSURE (ENSURE) Take 237 mLs by mouth 3 (three) times daily between meals.   Yes Historical Provider, MD  ipratropium-albuterol (DUONEB) 0.5-2.5 (3) MG/3ML SOLN Take 3 mLs by nebulization 2 (two) times daily.  10/22/14  Yes Historical Provider, MD  morphine (MS CONTIN) 30 MG 12 hr tablet Take 1 tablet (30 mg total) by mouth every 12 (twelve) hours. Patient taking differently: Take 30 mg by mouth every 8 (eight) hours.  09/01/14  Yes Vassie Loll, MD  oxyCODONE (ROXICODONE) 15 MG immediate release tablet Take 1 tablet (15 mg total) by mouth every 6 (six) hours as needed for moderate pain or breakthrough pain. Patient taking differently: Take 30 mg by mouth every 6 (six) hours as needed for pain.  09/01/14  Yes Vassie Loll, MD  pantoprazole (PROTONIX) 40  MG tablet Take 1 tablet (40 mg total) by mouth daily at 12 noon. 09/01/14  Yes Vassie Loll, MD  PROAIR HFA 108 [phone] BASE) MCG/ACT inhaler Inhale 1-2 puffs into the lungs every 4 (four) hours as needed.   11/04/14  Yes Historical Provider, MD  promethazine-codeine (PHENERGAN WITH CODEINE) 6.25-10 MG/5ML syrup Take 5 mLs by mouth every 6 (six) hours as needed for cough. 01/19/15  Yes Nyoka Cowden, MD  zolpidem (AMBIEN) 10 MG tablet Take 10 mg by mouth at bedtime as needed for sleep.   Yes Historical Provider, MD  azelastine (ASTELIN) 0.1 % nasal spray Place 2 sprays into both nostrils 2 (two) times daily. Use in each nostril as directed Patient not taking: Reported on 01/19/2015 10/31/14   Simonne Martinet, NP  fluticasone [facility]) 50 MCG/ACT nasal spray Place 2 sprays into both nostrils 2 (two) times daily. Patient not taking: Reported on 01/19/2015 10/31/14   Simonne Martinet, NP  pregabalin (LYRICA) 25 MG capsule Take 1 capsule (25 mg total) by mouth 2 (two) times daily. Patient not taking: Reported on 01/19/2015 09/01/14   Vassie Loll, MD   Allergies  Allergen Reactions  . Robitussin Dm [Dextromethorphan-Guaifenesin] Nausea And Vomiting  . Nsaids Hives  . Rayon, Purified Hives  . Tramadol Hives    FAMILY HISTORY:  indicated that her mother is deceased. She indicated that her father is deceased. She indicated that her other is deceased.  SOCIAL HISTORY:  reports that she quit smoking about 9 months ago. Her smoking use included Cigarettes. She has a 34 pack-year smoking history. She has never used smokeless tobacco. She reports that she drinks alcohol. She reports that she does not use illicit drugs.   SUBJECTIVE:  No issues overnight.   VITAL SIGNS: Temp:  [97.7 F (36.5 C)-99.2 F (37.3 C)] 98.5 F (36.9 C) (08/11 0818) Pulse Rate:  [70-140] 95 (08/11 1000) Resp:  [13-58] 13 (08/11 1000) BP: (85-152)/(37-103) 96/55 mmHg (08/11 1000) SpO2:  [93 %-100 %] 100 % (08/11 1000) FiO2 (%):  [40 %] 40 % (08/11 0749) Weight:  [176 lb 2.4 oz (79.9 kg)] 176 lb 2.4 oz (79.9 kg) (08/11 0421) HEMODYNAMICS:   VENTILATOR SETTINGS: Vent Mode:  [-] PSV;CPAP FiO2 (%):  [40 %] 40 % Set Rate:  [14  bmp] 14 bmp Vt Set:  [500 mL] 500 mL PEEP:  [5 cmH20] 5 cmH20 Pressure Support:  [5 cmH20] 5 cmH20 Plateau Pressure:  [18 cmH20-21 cmH20] 20 cmH20 INTAKE / OUTPUT:  Intake/Output Summary (Last 24 hours) at 03/26/15 1102 Last data filed at 03/26/15 1000  Gross per 24 hour  Intake 1063.5 ml  Output   2420 ml  Net -1356.5 ml    PHYSICAL EXAMINATION: General: Acute resp distress, increased WOB Neuro: Agitated HEENT:  PERRLA, moist mucus membranes. Stridor over throat Cardiovascular:  Sinus tachycardia. No MRG Lungs:  Scattered exp wheeze. Diffuse rhonchi Abdomen:  Soft, + BS Skin:  Intact  LABS:  CBC  Recent Labs Lab 03/24/15 1549 03/24/15 1940 03/26/15 0340  WBC  --  12.6* 19.3*  HGB 13.9 11.0* 10.2*  HCT 41.0 34.5* 31.7*  PLT  --  304 320   Coag's  Recent Labs Lab 03/24/15 1530  INR 1.03   BMET  Recent Labs Lab 03/24/15 1530 03/24/15 1549 03/24/15 1940 03/25/15 0215 03/26/15 0340  NA 136 141  --  135 139  K 3.9 3.8  --  3.9 4.5  CL 106 104  --  104 104  CO2 21*  --   --  22 26  BUN 9 10  --  13 6  CREATININE 0.97 0.90 0.97 1.10* 0.82  GLUCOSE 124* 123*  --  238* 134*   Electrolytes  Recent Labs Lab 03/24/15 1530 03/25/15 0215 03/26/15 0340  CALCIUM 9.3 9.3 9.4   Sepsis Markers  Recent Labs Lab 03/24/15 1549  LATICACIDVEN 2.55*   ABG  Recent Labs Lab 03/24/15 1602 03/25/15 1603  PHART 7.371 7.416  PCO2ART 38.4 35.6  PO2ART 108.0* 97.0   Liver Enzymes No results for input(s): AST, ALT, ALKPHOS, BILITOT, ALBUMIN in the last 168 hours. Cardiac Enzymes No results for input(s): TROPONINI, PROBNP in the last 168 hours. Glucose  Recent Labs Lab 03/24/15 2004  GLUCAP 130*    Imaging Dg Chest Port 1 View  03/25/2015   CLINICAL DATA:  Central line placement  EXAM: PORTABLE CHEST - 1 VIEW  COMPARISON:  03/25/2015  FINDINGS: Endotracheal tube with the tip 4.5 cm above the carina. Nasogastric tube coursing below the diaphragm.   Interval placement of a left jugular central venous catheter with the tip projecting over the right atrium.  No focal parenchymal opacity. No pleural effusion or pneumothorax. Heart and mediastinal contours are unremarkable. Osseous structures are unremarkable.  IMPRESSION: Interval placement of a left jugular central venous catheter with the tip projecting over the right atrium.   Electronically Signed   By: Elige Ko   On: 03/25/2015 17:52   Dg Chest Port 1 View  03/25/2015   CLINICAL DATA:  Intubation and orogastric tube placement.  EXAM: PORTABLE CHEST - 1 VIEW  COMPARISON:  One day prior  FINDINGS: Endotracheal tube terminates 4.9 cm above carina. Nasogastric extends beyond the inferior aspect of the film. Patient rotated minimally right. Normal heart size. No pleural effusion or pneumothorax. Low lung volumes, without consolidation. Numerous leads and wires project over the chest.  IMPRESSION: Appropriate position of endotracheal tube. Nasogastric tube extends beyond the inferior aspect of the film.  No acute cardiopulmonary disease.   Electronically Signed   By: Jeronimo Greaves M.D.   On: 03/25/2015 15:45     ASSESSMENT / PLAN:  PULMONARY  A: Acute asthmatic bronchitis complicated by severe anxiety with vocal cord dysfunction: Currently not showing signs of respiratory failure, ABG is reassuringly normal, chest x-ray is normal, and nearly all symptoms go away when she manages to follow sleep. Therefore the vast majority of her symptoms this time are explained by vocal cord dysfunction with a likely upper respiratory infection causing laryngitis and maybe tracheobronchitis.  Intubated on 8/10 for acute resp failure. After intubation the disappearance of wheezes and low peak pressures makes VCD more likely.   P:  Albuterol every q4 hrs. Will switch to xopenex Continue Steroids. Prednisone 80 mg q12 On ceftriaxone >>8/10 and  azithro >>8/10 Check ABG  NEUROLOGIC A: Severe anxiety with  panic attack Chronic narcotic dependence P:  Fentanyl and versed PRNs for sedation Continue home MS Contin  Start seroquel for severe anxiety  CARDIOVASCULAR  A: Sinus tachycardia, no signs of sepsis P:  Telemetry monitoring  RENAL A: No acute issues P:  Monitor BMET and UOP Replace electrolytes as needed   GASTROINTESTINAL A: Gastroesophageal reflux disease P:  Swallow eval and start feeds  HEMATOLOGIC A: No acute issues P:  Monitor for bleeding  INFECTIOUS A: Acute tracheobronchitis in association with exacerbation of asthma / COPD P:  Doxycycline 8/9 >> 8/10 Ceftriaxone Start 8/10 Azithromycin Start 8/10 Follow sputum  cultures  ENDOCRINE A: No acute issues  P:  Monitor glucose  FAMILY  - Updates: Girlfriend updated   TODAY'S SUMMARY: Had a worsening attack of stridor vs bronchospasm. Intubated on 8/10 Extubated on 8/11   [name] MD  Pulmonary and Critical Care Pager [phone] If no answer or after 3pm call: [phone] 03/26/2015, 11:02 AM

## 2015-03-26 NOTE — Evaluation (Signed)
Clinical/Bedside Swallow Evaluation Patient Details  Name: Julie Kerr MRN: [id number] Date of Birth: 1976-07-06  Today's Date: 03/26/2015 Time: SLP Start Time (ACUTE ONLY): 1509 SLP Stop Time (ACUTE ONLY): 1521 SLP Time Calculation (min) (ACUTE ONLY): 12 min  Past Medical History:  Past Medical History  Diagnosis Date  . Asthma   . Anxiety   . COPD (chronic obstructive pulmonary disease)   . Bronchitis   . Heart murmur   . Shortness of breath   . Diabetes mellitus without complication   . Hypertension    Past Surgical History:  Past Surgical History  Procedure Laterality Date  . Spinal fusion    . Tracheostomy  December 2015   HPI:  39 year old female with a past medical history significant for asthmatic bronchitis, severe anxiety with vocal cord dysfunction, spinal fusion, tracheostomy (december 2015) who came to the emergency department on 03/24/2015 complaining of 4 days of shortness of breath. 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 Intubated less than 24 hours. FEES completed 08/02/2014 indicated delayed swallow initiation but full airway protection. Patient with trach at that time. Has since been decannulated.    Assessment / Plan / Recommendation Clinical Impression  Patient presents with a functional oropharyngeal swallow without indication of aspiration. No SLP f/u indicated for dysphagia. Patient has not received SLP services for PVCD and would benefit post discharge. MD, please order OP SLP services. THank you.     Aspiration Risk  Mild    Diet Recommendation Age appropriate regular solids;Thin   Medication Administration: Whole meds with liquid Compensations: Small sips/bites;Slow rate    Other  Recommendations Oral Care Recommendations: Oral care BID   Follow Up Recommendations       Frequency and Duration        Pertinent Vitals/Pain n/a        Swallow Study    General Other  Pertinent Information: 39 year old female with a past medical history significant for asthmatic bronchitis, severe anxiety with vocal cord dysfunction, spinal fusion, tracheostomy (december 2015) who came to the emergency department on 03/24/2015 complaining of 4 days of shortness of breath. 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 Intubated less than 24 hours. FEES completed 08/02/2014 indicated delayed swallow initiation but full airway protection. Patient with trach at that time. Has since been decannulated.  Type of Study: Bedside swallow evaluation Previous Swallow Assessment: multiple previous assessments, WFL Diet Prior to this Study: NPO Temperature Spikes Noted: No Respiratory Status: Room air History of Recent Intubation: Yes Length of Intubations (days):  (less than 24 hours) Date extubated: 03/26/15 Behavior/Cognition: Alert;Cooperative;Pleasant mood Oral Cavity - Dentition: Adequate natural dentition/normal for age Self-Feeding Abilities: Able to feed self Patient Positioning: Upright in bed Baseline Vocal Quality: Normal Volitional Cough: Strong Volitional Swallow: Able to elicit    Oral/Motor/Sensory Function Overall Oral Motor/Sensory Function: Appears within functional limits for tasks assessed   Ice Chips Ice chips: Not tested   Thin Liquid Thin Liquid: Within functional limits Presentation: Cup;Self Fed;Straw    Nectar Thick Nectar Thick Liquid: Not tested   Honey Thick Honey Thick Liquid: Not tested   Puree Puree: Not tested   Solid   GO   Julie Deruiter MA, [name] [phone]  Solid: Within functional limits Presentation: Self Fed       Julie Kerr 03/26/2015,3:34 PM

## 2015-03-26 NOTE — Procedures (Signed)
Extubation Procedure Note  Patient Details:   Name: Julie Kerr DOB: [birth year] MRN: [id number]   Airway Documentation:  Airway 7.5 mm (Active)  Secured at (cm) 22 cm 03/26/2015  7:49 AM  Measured From Lips 03/26/2015  7:49 AM  Secured Location Center 03/26/2015  7:49 AM  Secured By [employer] 03/26/2015  7:49 AM  Tube Holder Repositioned Yes 03/26/2015  7:49 AM  Cuff Pressure (cm H2O) 28 cm H2O 03/25/2015  8:52 PM  Site Condition Cool;Dry 03/26/2015  5:45 AM    Evaluation  O2 sats: stable throughout Complications: No apparent complications Patient did tolerate procedure well. Bilateral Breath Sounds: Rhonchi Suctioning: Airway Yes   Pt was extubated to a 4lpm [place]. Cuff leak noted. No stidor heard. RN at bedside during extubation. BS are clear and diminished. RT will continue to monitor.  Darolyn Rua 03/26/2015, 12:38 PM

## 2015-03-26 NOTE — Progress Notes (Signed)
Wasted 280 cc of fentanyl and 30 cc of versed in sink and witnessed with RN Paticia Stack.

## 2015-03-26 NOTE — Progress Notes (Signed)
Patient weaning 

## 2015-03-27 LAB — CBC WITH DIFFERENTIAL/PLATELET
BASOS ABS: 0 10*3/uL (ref 0.0–0.1)
BASOS PCT: 0 % (ref 0–1)
EOS ABS: 0 10*3/uL (ref 0.0–0.7)
Eosinophils Relative: 0 % (ref 0–5)
HCT: 30.2 % — ABNORMAL LOW (ref 36.0–46.0)
Hemoglobin: 9.6 g/dL — ABNORMAL LOW (ref 12.0–15.0)
Lymphocytes Relative: 6 % — ABNORMAL LOW (ref 12–46)
Lymphs Abs: 1 10*3/uL (ref 0.7–4.0)
MCH: 25.7 pg — ABNORMAL LOW (ref 26.0–34.0)
MCHC: 31.8 g/dL (ref 30.0–36.0)
MCV: 81 fL (ref 78.0–100.0)
MONO ABS: 0.6 10*3/uL (ref 0.1–1.0)
Monocytes Relative: 4 % (ref 3–12)
NEUTROS ABS: 14 10*3/uL — AB (ref 1.7–7.7)
NEUTROS PCT: 90 % — AB (ref 43–77)
Platelets: 289 10*3/uL (ref 150–400)
RBC: 3.73 MIL/uL — ABNORMAL LOW (ref 3.87–5.11)
RDW: 14.5 % (ref 11.5–15.5)
WBC: 15.6 10*3/uL — ABNORMAL HIGH (ref 4.0–10.5)

## 2015-03-27 LAB — BASIC METABOLIC PANEL
Anion gap: 7 (ref 5–15)
BUN: 15 mg/dL (ref 6–20)
CO2: 28 mmol/L (ref 22–32)
Calcium: 9.2 mg/dL (ref 8.9–10.3)
Chloride: 103 mmol/L (ref 101–111)
Creatinine, Ser: 0.75 mg/dL (ref 0.44–1.00)
GFR calc Af Amer: >60 mL/min (ref >60)
GLUCOSE: 198 mg/dL — AB (ref 65–99)
POTASSIUM: 3.8 mmol/L (ref 3.5–5.1)
Sodium: 138 mmol/L (ref 135–145)

## 2015-03-27 LAB — GLUCOSE, CAPILLARY
GLUCOSE-CAPILLARY: 278 mg/dL — AB (ref 65–99)
GLUCOSE-CAPILLARY: 220 mg/dL — AB (ref 65–99)
Glucose-Capillary: 223 mg/dL — ABNORMAL HIGH (ref 65–99)

## 2015-03-27 MED ORDER — CETYLPYRIDINIUM CHLORIDE 0.05 % MT LIQD
7.0000 mL | Freq: Two times a day (BID) | OROMUCOSAL | Status: DC
  Administered 2015-03-27 – 2015-03-28 (×2): 7 mL via OROMUCOSAL

## 2015-03-27 MED ORDER — PANTOPRAZOLE SODIUM 40 MG PO TBEC
40.0000 mg | DELAYED_RELEASE_TABLET | Freq: Every day | ORAL | Status: DC
  Administered 2015-03-28: 40 mg via ORAL
  Filled 2015-03-27: qty 1

## 2015-03-27 MED ORDER — PREDNISONE 50 MG PO TABS
60.0000 mg | ORAL_TABLET | Freq: Every day | ORAL | Status: DC
  Filled 2015-03-27: qty 1

## 2015-03-27 MED ORDER — INSULIN ASPART 100 UNIT/ML ~~LOC~~ SOLN
8.0000 [IU] | Freq: Once | SUBCUTANEOUS | Status: AC
  Administered 2015-03-27: 8 [IU] via SUBCUTANEOUS

## 2015-03-27 MED ORDER — INSULIN ASPART 100 UNIT/ML ~~LOC~~ SOLN
2.0000 [IU] | SUBCUTANEOUS | Status: DC
  Administered 2015-03-27 – 2015-03-28 (×3): 6 [IU] via SUBCUTANEOUS

## 2015-03-27 MED ORDER — DIPHENHYDRAMINE HCL 25 MG PO CAPS
25.0000 mg | ORAL_CAPSULE | Freq: Four times a day (QID) | ORAL | Status: DC | PRN
  Administered 2015-03-27: 25 mg via ORAL
  Filled 2015-03-27: qty 1

## 2015-03-27 MED ORDER — PREDNISONE 50 MG PO TABS
60.0000 mg | ORAL_TABLET | Freq: Two times a day (BID) | ORAL | Status: DC
  Administered 2015-03-27 – 2015-03-28 (×2): 60 mg via ORAL
  Filled 2015-03-27 (×4): qty 1

## 2015-03-27 NOTE — Progress Notes (Signed)
PULMONARY / CRITICAL CARE MEDICINE   Name: Julie Kerr MRN: [id number] DOB: [birth year]    ADMISSION DATE:  03/24/2015 REFERRING MD : EDP  CHIEF COMPLAINT: Shortness of breath  INITIAL PRESENTATION: 39 year old female with a past medical history significant for asthmatic bronchitis, severe anxiety with vocal cord dysfunction who came to the emergency department on 03/24/2015 complaining of 4 days of shortness of breath. 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   SIGNIFICANT EVENTS: 8/10: Pt had another episode of severe resp distress with stridor. Intubated.  8/11: Weaning well today, extubated  HISTORY OF PRESENT ILLNESS: Julie Kerr is well-known to our service with a long history of asthmatic bronchitis, recurrent hospitalizations for shortness of breath, and severe anxiety with vocal cord dysfunction. She came to the emergency department on 03/24/2015 complaining of 4 days of body aches, headaches, sinus congestion, nausea, vomiting, fever to 103, and increasing shortness of breath. This was associated with a cough which is not productive of mucus. She had generalized weakness as well. She denied sick contacts. She said that the dyspnea progressed to the point where was so severe she felt she needed to come to the emergency department. In the emergency department she has been treated with racemic epinephrine, albuterol, Solu-Medrol, and has been seen by ear nose and throat. ENT performed a video laryngoscopy which showed paradoxical vocal cord movement which improved with instruction. ENT felt however that there may be some lower airway cause of her wheezing as well. Pulmonary critical care medicine was consulted for further evaluation. She is requesting narcotic medications in the emergency department. Specifically, when I asked her what helps with cough she requests fentanyl.  PAST MEDICAL HISTORY :   has a past medical  history of Asthma; Anxiety; COPD (chronic obstructive pulmonary disease); Bronchitis; Heart murmur; Shortness of breath; Diabetes mellitus without complication; and Hypertension.  has past surgical history that includes Spinal fusion and Tracheostomy (December 2015). Prior to Admission medications   Medication Sig Start Date End Date Taking? Authorizing Provider  albuterol (PROVENTIL) (2.5 MG/3ML) 0.083% nebulizer solution Take 3 mLs by nebulization every 2 (two) hours as needed for wheezing or shortness of breath.  08/21/14  Yes Historical Provider, MD  alprazolam Prudy Feeler) 2 MG tablet Take 2 mg by mouth 3 (three) times daily as needed for anxiety.   Yes Historical Provider, MD  arformoterol (BROVANA) 15 MCG/2ML NEBU Take 2 mLs (15 mcg total) by nebulization 2 (two) times daily. 10/31/14  Yes Simonne Martinet, NP  budesonide (PULMICORT) 0.5 MG/2ML nebulizer solution Take 2 mLs (0.5 mg total) by nebulization 2 (two) times daily. 10/31/14  Yes Simonne Martinet, NP  ENSURE (ENSURE) Take 237 mLs by mouth 3 (three) times daily between meals.   Yes Historical Provider, MD  ipratropium-albuterol (DUONEB) 0.5-2.5 (3) MG/3ML SOLN Take 3 mLs by nebulization 2 (two) times daily.  10/22/14  Yes Historical Provider, MD  morphine (MS CONTIN) 30 MG 12 hr tablet Take 1 tablet (30 mg total) by mouth every 12 (twelve) hours. Patient taking differently: Take 30 mg by mouth every 8 (eight) hours.  09/01/14  Yes Vassie Loll, MD  oxyCODONE (ROXICODONE) 15 MG immediate release tablet Take 1 tablet (15 mg total) by mouth every 6 (six) hours as needed for moderate pain or breakthrough pain. Patient taking differently: Take 30 mg by mouth every 6 (six) hours as needed for pain.  09/01/14  Yes Vassie Loll, MD  pantoprazole (PROTONIX) 40  MG tablet Take 1 tablet (40 mg total) by mouth daily at 12 noon. 09/01/14  Yes Vassie Loll, MD  PROAIR HFA 108 [phone] BASE) MCG/ACT inhaler Inhale 1-2 puffs into the lungs every 4 (four) hours as needed.   11/04/14  Yes Historical Provider, MD  promethazine-codeine (PHENERGAN WITH CODEINE) 6.25-10 MG/5ML syrup Take 5 mLs by mouth every 6 (six) hours as needed for cough. 01/19/15  Yes Nyoka Cowden, MD  zolpidem (AMBIEN) 10 MG tablet Take 10 mg by mouth at bedtime as needed for sleep.   Yes Historical Provider, MD  azelastine (ASTELIN) 0.1 % nasal spray Place 2 sprays into both nostrils 2 (two) times daily. Use in each nostril as directed Patient not taking: Reported on 01/19/2015 10/31/14   Simonne Martinet, NP  fluticasone [facility]) 50 MCG/ACT nasal spray Place 2 sprays into both nostrils 2 (two) times daily. Patient not taking: Reported on 01/19/2015 10/31/14   Simonne Martinet, NP  pregabalin (LYRICA) 25 MG capsule Take 1 capsule (25 mg total) by mouth 2 (two) times daily. Patient not taking: Reported on 01/19/2015 09/01/14   Vassie Loll, MD   Allergies  Allergen Reactions  . Robitussin Dm [Dextromethorphan-Guaifenesin] Nausea And Vomiting  . Nsaids Hives  . [place], Purified Hives  . Tramadol Hives    FAMILY HISTORY:  indicated that her mother is deceased. She indicated that her father is deceased. She indicated that her other is deceased.  SOCIAL HISTORY:  reports that she quit smoking about 9 months ago. Her smoking use included Cigarettes. She has a 34 pack-year smoking history. She has never used smokeless tobacco. She reports that she drinks alcohol. She reports that she does not use illicit drugs.   SUBJECTIVE:  No issues overnight.   VITAL SIGNS: Temp:  [98 F (36.7 C)-98.9 F (37.2 C)] 98.2 F (36.8 C) (08/12 0825) Pulse Rate:  [78-109] 91 (08/12 1000) Resp:  [14-48] 18 (08/12 1000) BP: (108-128)/(54-69) 120/57 mmHg (08/12 1000) SpO2:  [95 %-100 %] 100 % (08/12 1000) Weight:  [179 lb 0.2 oz (81.2 kg)] 179 lb 0.2 oz (81.2 kg) (08/12 0412) HEMODYNAMICS:   VENTILATOR SETTINGS:   INTAKE / OUTPUT:  Intake/Output Summary (Last 24 hours) at 03/27/15 1109 Last data filed at 03/27/15  1000  Gross per 24 hour  Intake 3183.5 ml  Output   3525 ml  Net -341.5 ml    PHYSICAL EXAMINATION: General: Calm, no agitation, oriented HEENT:  PERRLA, moist mucus membranes.  Cardiovascular:  Sinus tachycardia. No MRG Lungs:  Scattered exp wheeze.  Abdomen:  Soft, + BS Skin:  Intact  LABS:  CBC  Recent Labs Lab 03/24/15 1940 03/26/15 0340 03/27/15 0350  WBC 12.6* 19.3* 15.6*  HGB 11.0* 10.2* 9.6*  HCT 34.5* 31.7* 30.2*  PLT 304 320 289   Coag's  Recent Labs Lab 03/24/15 1530  INR 1.03   BMET  Recent Labs Lab 03/25/15 0215 03/26/15 0340 03/27/15 0350  NA 135 139 138  K 3.9 4.5 3.8  CL 104 104 103  CO2 22 26 28   BUN 13 6 15   CREATININE 1.10* 0.82 0.75  GLUCOSE 238* 134* 198*   Electrolytes  Recent Labs Lab 03/25/15 0215 03/26/15 0340 03/27/15 0350  CALCIUM 9.3 9.4 9.2   Sepsis Markers  Recent Labs Lab 03/24/15 1549  LATICACIDVEN 2.55*   ABG  Recent Labs Lab 03/24/15 1602 03/25/15 1603  PHART 7.371 7.416  PCO2ART 38.4 35.6  PO2ART 108.0* 97.0   Liver Enzymes No results  for input(s): AST, ALT, ALKPHOS, BILITOT, ALBUMIN in the last 168 hours. Cardiac Enzymes No results for input(s): TROPONINI, PROBNP in the last 168 hours. Glucose  Recent Labs Lab 03/24/15 2004  GLUCAP 130*    Imaging No results found.   ASSESSMENT / PLAN:  PULMONARY  A: Acute asthmatic bronchitis complicated by severe anxiety with vocal cord dysfunction: Currently not showing signs of respiratory failure, ABG is reassuringly normal, chest x-ray is normal, and nearly all symptoms go away when she manages to follow sleep. Therefore the vast majority of her symptoms this time are explained by vocal cord dysfunction with a likely upper respiratory infection causing laryngitis and maybe tracheobronchitis.  Intubated on 8/10 for acute resp failure. After intubation the disappearance of wheezes and low peak pressures makes VCD more likely. Extubated the next  day.  P:  Xopenex q12 Continue Steroids. Can reduce prednisone to 60 mg bid On ceftriaxone >>8/10 and  azithro >>8/10  NEUROLOGIC A: Severe anxiety with panic attack Chronic narcotic dependence P:  Fentanyl and versed PRNs for sedation Continue home MS Contin  On seroquel for severe anxiety  CARDIOVASCULAR  A: Sinus tachycardia, no signs of sepsis P:  Telemetry monitoring  RENAL A: No acute issues P:  Monitor BMET and UOP Replace electrolytes as needed   GASTROINTESTINAL A: Gastroesophageal reflux disease P:  On feeds  HEMATOLOGIC A: No acute issues P:  Monitor for bleeding  INFECTIOUS A: Acute tracheobronchitis in association with exacerbation of asthma / COPD P:  Doxycycline 8/9 >> 8/10 Ceftriaxone Start 8/10 Azithromycin Start 8/10 Follow sputum cultures  ENDOCRINE A: No acute issues  P:  Monitor glucose  FAMILY  - Updates: Girlfriend updated   TODAY'S SUMMARY: Had a worsening attack of stridor vs bronchospasm. Intubated on 8/10 Extubated on 8/11  Pt is not critically ill   [name] MD [facility] Pager [phone] If no answer or after 3pm call: [phone] 03/27/2015, 11:09 AM

## 2015-03-27 NOTE — Progress Notes (Signed)
CBG 278 called to [facility] MD via Vinnie Langton RN

## 2015-03-27 NOTE — Progress Notes (Signed)
eLink Physician-Brief Progress Note Patient Name: Julie Kerr DOB: 08/10/1976 MRN: [id number]   Date of Service  03/27/2015  HPI/Events of Note  Blood glucose = 278. Patient is eating, on Prednisone and is on Q 4 hour standard Novolog SSI.   eICU Interventions  Will give Novolog 8 units [place] now.      Intervention Category Intermediate Interventions: Hyperglycemia - evaluation and treatment  Sommer,Steven Eugene 03/27/2015, 4:20 PM

## 2015-03-28 DIAGNOSIS — J45901 Unspecified asthma with (acute) exacerbation: Principal | ICD-10-CM

## 2015-03-28 LAB — CBC WITH DIFFERENTIAL/PLATELET
Basophils Absolute: 0 10*3/uL (ref 0.0–0.1)
Basophils Relative: 0 % (ref 0–1)
EOS ABS: 0 10*3/uL (ref 0.0–0.7)
EOS PCT: 0 % (ref 0–5)
HCT: 32.9 % — ABNORMAL LOW (ref 36.0–46.0)
HEMOGLOBIN: 10.3 g/dL — AB (ref 12.0–15.0)
LYMPHS ABS: 2 10*3/uL (ref 0.7–4.0)
LYMPHS PCT: 12 % (ref 12–46)
MCH: 25.4 pg — ABNORMAL LOW (ref 26.0–34.0)
MCHC: 31.3 g/dL (ref 30.0–36.0)
MCV: 81.2 fL (ref 78.0–100.0)
MONO ABS: 0.9 10*3/uL (ref 0.1–1.0)
Monocytes Relative: 5 % (ref 3–12)
NEUTROS ABS: 13.5 10*3/uL — AB (ref 1.7–7.7)
Neutrophils Relative %: 83 % — ABNORMAL HIGH (ref 43–77)
Platelets: 331 10*3/uL (ref 150–400)
RBC: 4.05 MIL/uL (ref 3.87–5.11)
RDW: 14.4 % (ref 11.5–15.5)
WBC: 16.4 10*3/uL — ABNORMAL HIGH (ref 4.0–10.5)

## 2015-03-28 LAB — BASIC METABOLIC PANEL
Anion gap: 8 (ref 5–15)
BUN: 9 mg/dL (ref 6–20)
CALCIUM: 8.9 mg/dL (ref 8.9–10.3)
CHLORIDE: 101 mmol/L (ref 101–111)
CO2: 27 mmol/L (ref 22–32)
Creatinine, Ser: 0.85 mg/dL (ref 0.44–1.00)
GFR calc Af Amer: >60 mL/min (ref >60)
Glucose, Bld: 203 mg/dL — ABNORMAL HIGH (ref 65–99)
Potassium: 3.6 mmol/L (ref 3.5–5.1)
Sodium: 136 mmol/L (ref 135–145)

## 2015-03-28 LAB — URINE CULTURE

## 2015-03-28 LAB — GLUCOSE, CAPILLARY: Glucose-Capillary: 250 mg/dL — ABNORMAL HIGH (ref 65–99)

## 2015-03-28 MED ORDER — OXYCODONE HCL 5 MG PO TABS
15.0000 mg | ORAL_TABLET | ORAL | Status: DC | PRN
  Administered 2015-03-28 (×2): 15 mg via ORAL
  Filled 2015-03-28 (×2): qty 3

## 2015-03-28 MED ORDER — ARFORMOTEROL TARTRATE 15 MCG/2ML IN NEBU: 15.0000 ug | INHALATION_SOLUTION | Freq: Two times a day (BID) | RESPIRATORY_TRACT | Status: DC

## 2015-03-28 MED ORDER — ALPRAZOLAM 0.5 MG PO TABS
2.0000 mg | ORAL_TABLET | Freq: Three times a day (TID) | ORAL | Status: DC | PRN
  Administered 2015-03-28: 2 mg via ORAL
  Filled 2015-03-28: qty 4

## 2015-03-28 MED ORDER — AZITHROMYCIN 500 MG PO TABS: 500.0000 mg | ORAL_TABLET | Freq: Every day | ORAL | Status: DC

## 2015-03-28 MED ORDER — QUETIAPINE FUMARATE 100 MG PO TABS: 100.0000 mg | ORAL_TABLET | Freq: Two times a day (BID) | ORAL | Status: DC

## 2015-03-28 MED ORDER — PANTOPRAZOLE SODIUM 40 MG PO TBEC: 40.0000 mg | DELAYED_RELEASE_TABLET | Freq: Every day | ORAL | Status: DC

## 2015-03-28 MED ORDER — ALPRAZOLAM 2 MG PO TABS: 2.0000 mg | ORAL_TABLET | Freq: Three times a day (TID) | ORAL | Status: DC | PRN

## 2015-03-28 MED ORDER — PREGABALIN 25 MG PO CAPS: 25.0000 mg | ORAL_CAPSULE | Freq: Two times a day (BID) | ORAL | Status: DC

## 2015-03-28 MED ORDER — ALBUTEROL SULFATE (2.5 MG/3ML) 0.083% IN NEBU: 3.0000 mL | INHALATION_SOLUTION | RESPIRATORY_TRACT | Status: DC | PRN

## 2015-03-28 MED ORDER — OXYCODONE HCL 5 MG PO TABS
15.0000 mg | ORAL_TABLET | Freq: Four times a day (QID) | ORAL | Status: DC | PRN
  Administered 2015-03-28: 15 mg via ORAL
  Filled 2015-03-28: qty 3

## 2015-03-28 MED ORDER — MORPHINE SULFATE ER 30 MG PO TBCR: 30.0000 mg | EXTENDED_RELEASE_TABLET | Freq: Two times a day (BID) | ORAL | Status: DC

## 2015-03-28 MED ORDER — PREDNISONE 10 MG PO TABS: ORAL_TABLET | ORAL | Status: DC

## 2015-03-28 MED ORDER — BUDESONIDE 0.5 MG/2ML IN SUSP: 0.5000 mg | Freq: Two times a day (BID) | RESPIRATORY_TRACT | Status: DC

## 2015-03-28 MED ORDER — OXYCODONE HCL 15 MG PO TABS: 15.0000 mg | ORAL_TABLET | Freq: Four times a day (QID) | ORAL | Status: DC | PRN

## 2015-03-28 MED ORDER — PROMETHAZINE-CODEINE 6.25-10 MG/5ML PO SYRP: 5.0000 mL | ORAL_SOLUTION | Freq: Four times a day (QID) | ORAL | Status: DC | PRN

## 2015-03-28 NOTE — Progress Notes (Signed)
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  I have no evidence at all to support that she has asthma at all but suggest if we're going to treat asthma here it should be with meds that don't provoke vcd  First choice would be dulera 100  Or symb 80 2 bid and do not think she needs neb at all   I am willing to see her in f/u but will insist she also be followed regularly by Dr Jenne Pane service and always do full med reconciliation when she comes to my office by bringing all meds two bags/ one maint and one prns.  If not, I will w/d from her her care and she will need to be referred to [facility] pulm/ent divisions   I spent 25 m with her today spelling out as clearly as I could what she needs to do to prevent future adverse effects from vcd  >>>  Asthma  (which I doubt she really has)    Sandrea Hughs, MD Pulmonary and Critical Care Medicine [name] Cell [phone] After 5:30 PM or weekends, call [phone]

## 2015-03-28 NOTE — Discharge Summary (Signed)
Physician Discharge Summary  Julie Kerr ZOX:[id number] DOB: 1975/09/10 DOA: 03/24/2015  PCP: No primary care provider on file.  Admit date: 03/24/2015 Discharge date: 03/28/2015  Time spent: *25 minutes  Recommendations for Outpatient Follow-up:  1. *Follow up PCP in 2 weeks  Discharge Diagnoses:  Active Problems:   Asthma exacerbation   Discharge Condition: Stable  Diet recommendation: Low salt diet  Filed Weights   03/26/15 0421 03/27/15 0412 03/28/15 0454  Weight: 79.9 kg (176 lb 2.4 oz) 81.2 kg (179 lb 0.2 oz) 83.8 kg (184 lb 11.9 oz)    History of present illness:  39 year old female with a past medical history significant for asthmatic bronchitis, severe anxiety with vocal cord dysfunction who came to the emergency department on 03/24/2015 complaining of 4 days of shortness of breath. 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   Hospital Course:  She came to the emergency department on 03/24/2015 complaining of 4 days of body aches, headaches, sinus congestion, nausea, vomiting, fever to 103, and increasing shortness of breath. This was associated with a cough which is not productive of mucus. She had generalized weakness as well. She denied sick contacts. She said that the dyspnea progressed to the point where was so severe she felt she needed to come to the emergency department. In the emergency department she has been treated with racemic epinephrine, albuterol, Solu-Medrol, and has been seen by ear nose and throat. ENT performed a video laryngoscopy which showed paradoxical vocal cord movement which improved with instruction. ENT felt however that there may be some lower airway cause of her wheezing as well. Pulmonary critical care medicine was consulted for further evaluation.  SIGNIFICANT EVENTS: 8/10: Pt had another episode of severe resp distress with stridor. Intubated.  8/11: Weaned,  extubated  Patient transferred to medical floor, no dyspnea, not requiring oxygen. She has been afebrile with normal WBC. Will discharge home on Prednisone taper and po Zithromax for 3 more days.  Procedures:  Video laryngoscopy  Intubation and mechanical ventilation  Consultations:  CCM  ENT  Discharge Exam: Filed Vitals:   03/28/15 0600  BP: 112/69  Pulse: 85  Temp:   Resp: 20    General: Appear in no acute distress Cardiovascular: S1S2 RRR Respiratory: Clear bilaterally  Discharge Instructions   Discharge Instructions    Diet - low sodium heart healthy    Complete by:  As directed      Increase activity slowly    Complete by:  As directed           Current Discharge Medication List    START taking these medications   Details  azithromycin (ZITHROMAX) 500 MG tablet Take 1 tablet (500 mg total) by mouth daily. Qty: 3 tablet, Refills: 0    predniSONE (DELTASONE) 10 MG tablet Prednisone 60 mg po daily x 2 day then Prednisone 50 mg po daily x 2 day then Prednisone 40 mg po daily x 2 day then Prednisone 20 mg daily x 2 days then Prednisone 10 mg po daily x 2 days then stop Qty: 20 tablet, Refills: 0      CONTINUE these medications which have NOT CHANGED   Details  albuterol (PROVENTIL) (2.5 MG/3ML) 0.083% nebulizer solution Take 3 mLs by nebulization every 2 (two) hours as needed for wheezing or shortness of breath.  Refills: 12    alprazolam (XANAX) 2 MG tablet Take 2 mg by mouth 3 (three) times daily as needed  for anxiety.    arformoterol (BROVANA) 15 MCG/2ML NEBU Take 2 mLs (15 mcg total) by nebulization 2 (two) times daily. Qty: 120 mL, Refills: 6    budesonide (PULMICORT) 0.5 MG/2ML nebulizer solution Take 2 mLs (0.5 mg total) by nebulization 2 (two) times daily. Qty: 2 mL, Refills: 12    ENSURE (ENSURE) Take 237 mLs by mouth 3 (three) times daily between meals.    ipratropium-albuterol (DUONEB) 0.5-2.5 (3) MG/3ML SOLN Take 3 mLs by nebulization  2 (two) times daily.  Refills: 2    morphine (MS CONTIN) 30 MG 12 hr tablet Take 1 tablet (30 mg total) by mouth every 12 (twelve) hours. Qty: 40 tablet, Refills: 0    oxyCODONE (ROXICODONE) 15 MG immediate release tablet Take 1 tablet (15 mg total) by mouth every 6 (six) hours as needed for moderate pain or breakthrough pain. Qty: 45 tablet, Refills: 0    pantoprazole (PROTONIX) 40 MG tablet Take 1 tablet (40 mg total) by mouth daily at 12 noon. Qty: 30 tablet, Refills: 1    PROAIR HFA 108 (90 BASE) MCG/ACT inhaler Inhale 1-2 puffs into the lungs every 4 (four) hours as needed.  Refills: 6    promethazine-codeine (PHENERGAN WITH CODEINE) 6.25-10 MG/5ML syrup Take 5 mLs by mouth every 6 (six) hours as needed for cough. Qty: 120 mL, Refills: 0    zolpidem (AMBIEN) 10 MG tablet Take 10 mg by mouth at bedtime as needed for sleep.    azelastine (ASTELIN) 0.1 % nasal spray Place 2 sprays into both nostrils 2 (two) times daily. Use in each nostril as directed Qty: 30 mL, Refills: 12    fluticasone (FLONASE) 50 MCG/ACT nasal spray Place 2 sprays into both nostrils 2 (two) times daily. Qty: 16 g, Refills: 6    pregabalin (LYRICA) 25 MG capsule Take 1 capsule (25 mg total) by mouth 2 (two) times daily. Qty: 60 capsule, Refills: 0       Allergies  Allergen Reactions  . Robitussin Dm [Dextromethorphan-Guaifenesin] Nausea And Vomiting  . Chocolate Hives  . Nsaids Hives  . Rayon, Purified Hives  . Tramadol Hives      The results of significant diagnostics from this hospitalization (including imaging, microbiology, ancillary and laboratory) are listed below for reference.    Significant Diagnostic Studies: Dg Neck Soft Tissue  03/24/2015   CLINICAL DATA:  Three day history of wheezing.  EXAM: PORTABLE CHEST - 1 VIEW; NECK SOFT TISSUES - 1+ VIEW  COMPARISON:  02/11/2015  FINDINGS: The cardiac silhouette, mediastinal and hilar contours are within normal limits and stable. Slightly low  lung volumes with mild peribronchial thickening and slight increased interstitial markings. This could suggest reactive airways disease or bronchitis. No focal infiltrates, edema or effusion. The bony thorax is intact.  IMPRESSION: Findings suggest bronchitis, reactive airways disease or interstitial pneumonitis but no focal infiltrates.   Electronically Signed   By: Rudie Meyer M.D.   On: 03/24/2015 16:17   Dg Chest Port 1 View  03/25/2015   CLINICAL DATA:  Central line placement  EXAM: PORTABLE CHEST - 1 VIEW  COMPARISON:  03/25/2015  FINDINGS: Endotracheal tube with the tip 4.5 cm above the carina. Nasogastric tube coursing below the diaphragm.  Interval placement of a left jugular central venous catheter with the tip projecting over the right atrium.  No focal parenchymal opacity. No pleural effusion or pneumothorax. Heart and mediastinal contours are unremarkable. Osseous structures are unremarkable.  IMPRESSION: Interval placement of a left jugular central  venous catheter with the tip projecting over the right atrium.   Electronically Signed   By: Elige Ko   On: 03/25/2015 17:52   Dg Chest Port 1 View  03/25/2015   CLINICAL DATA:  Intubation and orogastric tube placement.  EXAM: PORTABLE CHEST - 1 VIEW  COMPARISON:  One day prior  FINDINGS: Endotracheal tube terminates 4.9 cm above carina. Nasogastric extends beyond the inferior aspect of the film. Patient rotated minimally right. Normal heart size. No pleural effusion or pneumothorax. Low lung volumes, without consolidation. Numerous leads and wires project over the chest.  IMPRESSION: Appropriate position of endotracheal tube. Nasogastric tube extends beyond the inferior aspect of the film.  No acute cardiopulmonary disease.   Electronically Signed   By: Jeronimo Greaves M.D.   On: 03/25/2015 15:45   Dg Chest Portable 1 View  03/24/2015   CLINICAL DATA:  Three day history of wheezing.  EXAM: PORTABLE CHEST - 1 VIEW; NECK SOFT TISSUES - 1+ VIEW   COMPARISON:  02/11/2015  FINDINGS: The cardiac silhouette, mediastinal and hilar contours are within normal limits and stable. Slightly low lung volumes with mild peribronchial thickening and slight increased interstitial markings. This could suggest reactive airways disease or bronchitis. No focal infiltrates, edema or effusion. The bony thorax is intact.  IMPRESSION: Findings suggest bronchitis, reactive airways disease or interstitial pneumonitis but no focal infiltrates.   Electronically Signed   By: Rudie Meyer M.D.   On: 03/24/2015 16:17    Microbiology: Recent Results (from the past 240 hour(s))  MRSA PCR Screening     Status: None   Collection Time: 03/24/15  6:22 PM  Result Value Ref Range Status   MRSA by PCR NEGATIVE NEGATIVE Final    Comment:        The GeneXpert MRSA Assay (FDA approved for NASAL specimens only), is one component of a comprehensive MRSA colonization surveillance program. It is not intended to diagnose MRSA infection nor to guide or monitor treatment for MRSA infections.   Culture, Urine     Status: None (Preliminary result)   Collection Time: 03/25/15  3:26 PM  Result Value Ref Range Status   Specimen Description URINE, CATHETERIZED  Final   Special Requests NONE  Final   Culture 50,000 COLONIES/mL ESCHERICHIA COLI  Final   Report Status PENDING  Incomplete  Culture, respiratory (NON-Expectorated)     Status: None (Preliminary result)   Collection Time: 03/26/15 12:36 PM  Result Value Ref Range Status   Specimen Description TRACHEAL ASPIRATE  Final   Special Requests NONE  Final   Gram Stain   Final    RARE WBC PRESENT, PREDOMINANTLY PMN RARE SQUAMOUS EPITHELIAL CELLS PRESENT NO ORGANISMS SEEN Performed at [name]    Culture PENDING  Incomplete   Report Status PENDING  Incomplete     Labs: Basic Metabolic Panel:  Recent Labs Lab 03/24/15 1530 03/24/15 1549 03/24/15 1940 03/25/15 0215 03/26/15 0340 03/27/15 0350  03/28/15 0456  NA 136 141  --  135 139 138 136  K 3.9 3.8  --  3.9 4.5 3.8 3.6  CL 106 104  --  104 104 103 101  CO2 21*  --   --  22 26 28 27   GLUCOSE 124* 123*  --  238* 134* 198* 203*  BUN 9 10  --  13 6 15 9   CREATININE 0.97 0.90 0.97 1.10* 0.82 0.75 0.85  CALCIUM 9.3  --   --  9.3 9.4 9.2 8.9  Liver Function Tests: No results for input(s): AST, ALT, ALKPHOS, BILITOT, PROT, ALBUMIN in the last 168 hours. No results for input(s): LIPASE, AMYLASE in the last 168 hours. No results for input(s): AMMONIA in the last 168 hours. CBC:  Recent Labs Lab 03/24/15 1549 03/24/15 1940 03/26/15 0340 03/27/15 0350 03/28/15 0456  WBC  --  12.6* 19.3* 15.6* 16.4*  NEUTROABS  --   --  17.4* 14.0* 13.5*  HGB 13.9 11.0* 10.2* 9.6* 10.3*  HCT 41.0 34.5* 31.7* 30.2* 32.9*  MCV  --  78.9 80.5 81.0 81.2  PLT  --  304 320 289 331   Cardiac Enzymes: No results for input(s): CKTOTAL, CKMB, CKMBINDEX, TROPONINI in the last 168 hours. BNP: BNP (last 3 results) No results for input(s): BNP in the last 8760 hours.  ProBNP (last 3 results)  Recent Labs  05/06/14 2328 07/25/14 1718 07/25/14 1844  PROBNP <5.0 <5.0 <5.0    CBG:  Recent Labs Lab 03/24/15 2004 03/27/15 1135 03/27/15 1525 03/27/15 1906 03/27/15 2359  GLUCAP 130* 223* 278* 220* 250*       Signed:  Tivis Wherry S  [facility] 03/28/2015, 11:56 AM

## 2015-03-28 NOTE — Progress Notes (Signed)
eLink Physician-Brief Progress Note Patient Name: Julie Kerr DOB: March 12, 1976 MRN: [id number]   Date of Service  03/28/2015  HPI/Events of Note  Asking for pain meds, chr pain  eICU Interventions  dc'd ICU sedation orders Renewed 1/2 home dose of oxycodone     Intervention Category Intermediate Interventions: Medication change / dose adjustment  Calynn Ferrero V. 03/28/2015, 12:39 AM

## 2015-03-28 NOTE — Progress Notes (Signed)
Discharge instructions and medications discussed with patient.  Prescriptions give to patient. All questions answered.

## 2015-03-28 NOTE — Care Management Note (Signed)
Case Management Note  Patient Details  Name: Aquila Menzie MRN: [id number] Date of Birth: 01/08/1976  Subjective/Objective:                   COPD exacerbation. Action/Plan:  Discharge planning Expected Discharge Date:  03/31/15                Expected Discharge Plan:  Home/Self Care  In-House Referral:     Discharge planning Services  CM Consult  Post Acute Care Choice:    Choice offered to:     DME Arranged:  Nebulizer machine DME Agency:  [facility]  HH Arranged:    [facility] Agency:     Status of Service:  Completed, signed off  Medicare Important Message Given:    Date Medicare IM Given:    Medicare IM give by:    Date Additional Medicare IM Given:    Additional Medicare Important Message give by:     If discussed at Long Length of Stay Meetings, dates discussed:    Additional Comments: CM received call from [facility] to please arrange for nebulizer machine for pt.  Cm called [facility] DME rep, Fayrene Fearing to please deliver the nebulizer machine to room so pt can be dsicharged.  No other CM needs were communicated. Yves Dill, RN 03/28/2015, 12:31 PM

## 2015-03-28 NOTE — Progress Notes (Addendum)
   03/28/15 0439  Vitals  Temp 98.6 F (37 C)  BP 122/61 mmHg  Pulse Rate 87  Resp (!) 24  Oxygen Therapy  SpO2 96 %  O2 Device Room Air   Pt stated she had 7/10 chest pain. She describes the pain as a sharp, stabbing, and shooting  with some pressure. She states the pain starts midsternum on the left side and ends under her breastbone. I palpated the area that pt c/o was hurting and the pain was reproducible. 2L O2 via [place] applied, EKG obtained and Dr. Vassie Loll notified. No new orders obtained. Respiratory was notified to give pt prn neb treatment. Pt is on continuous telemetry. Will continue to monitor.    Pt received prn neb treatment and  lorazepam IV. VSS.

## 2015-03-29 LAB — CULTURE, RESPIRATORY W GRAM STAIN

## 2015-03-29 LAB — RESPIRATORY VIRUS PANEL
Adenovirus: NEGATIVE
INFLUENZA A: NEGATIVE
INFLUENZA B 1: NEGATIVE
Metapneumovirus: NEGATIVE
PARAINFLUENZA 1 A: NEGATIVE
Parainfluenza 2: NEGATIVE
Parainfluenza 3: NEGATIVE
RESPIRATORY SYNCYTIAL VIRUS B: NEGATIVE
Respiratory Syncytial Virus A: NEGATIVE
Rhinovirus: NEGATIVE

## 2015-03-29 LAB — CULTURE, RESPIRATORY

## 2015-03-30 NOTE — Progress Notes (Signed)
Dr. Sharl Ma called [facility] pharmacy to discontinue Seroquel prescription given at discharge. This medication was only intended to be used IN the hospital for anxiety. Post-discharge use Xanax.  Rateel Beldin S. Merilynn Finland, PharmD, BCPS Clinical Staff Pharmacist Pager [phone]

## 2015-04-24 ENCOUNTER — Inpatient Hospital Stay (HOSPITAL_COMMUNITY)
Admission: EM | Admit: 2015-04-24 | Discharge: 2015-04-30 | DRG: 154 | Disposition: A | Discharge status: Home Health | Payer: Medicare Other | Attending: Internal Medicine | Admitting: Internal Medicine

## 2015-04-24 ENCOUNTER — Encounter (HOSPITAL_COMMUNITY): Payer: Self-pay | Admitting: Cardiology

## 2015-04-24 ENCOUNTER — Emergency Department (HOSPITAL_COMMUNITY): Payer: Medicare Other

## 2015-04-24 ENCOUNTER — Encounter (HOSPITAL_COMMUNITY): Payer: Self-pay | Admitting: Emergency Medicine

## 2015-04-24 ENCOUNTER — Emergency Department (HOSPITAL_COMMUNITY)
Admission: EM | Admit: 2015-04-24 | Discharge: 2015-04-24 | Disposition: A | Discharge status: Home / Self Care | Payer: Medicare Other | Source: Home / Self Care | Attending: Emergency Medicine | Admitting: Emergency Medicine

## 2015-04-24 DIAGNOSIS — J45901 Unspecified asthma with (acute) exacerbation: Secondary | ICD-10-CM

## 2015-04-24 DIAGNOSIS — R918 Other nonspecific abnormal finding of lung field: Secondary | ICD-10-CM | POA: Diagnosis not present

## 2015-04-24 DIAGNOSIS — R05 Cough: Secondary | ICD-10-CM | POA: Diagnosis not present

## 2015-04-24 DIAGNOSIS — Z79891 Long term (current) use of opiate analgesic: Secondary | ICD-10-CM

## 2015-04-24 DIAGNOSIS — R0600 Dyspnea, unspecified: Secondary | ICD-10-CM | POA: Diagnosis present

## 2015-04-24 DIAGNOSIS — G4700 Insomnia, unspecified: Secondary | ICD-10-CM | POA: Diagnosis present

## 2015-04-24 DIAGNOSIS — R0602 Shortness of breath: Secondary | ICD-10-CM | POA: Diagnosis not present

## 2015-04-24 DIAGNOSIS — G894 Chronic pain syndrome: Secondary | ICD-10-CM | POA: Diagnosis present

## 2015-04-24 DIAGNOSIS — Z886 Allergy status to analgesic agent status: Secondary | ICD-10-CM

## 2015-04-24 DIAGNOSIS — J45909 Unspecified asthma, uncomplicated: Secondary | ICD-10-CM | POA: Diagnosis present

## 2015-04-24 DIAGNOSIS — W19XXXA Unspecified fall, initial encounter: Secondary | ICD-10-CM

## 2015-04-24 DIAGNOSIS — Z87891 Personal history of nicotine dependence: Secondary | ICD-10-CM

## 2015-04-24 DIAGNOSIS — Z7951 Long term (current) use of inhaled steroids: Secondary | ICD-10-CM

## 2015-04-24 DIAGNOSIS — Z79899 Other long term (current) drug therapy: Secondary | ICD-10-CM

## 2015-04-24 DIAGNOSIS — R0902 Hypoxemia: Secondary | ICD-10-CM | POA: Diagnosis present

## 2015-04-24 DIAGNOSIS — J441 Chronic obstructive pulmonary disease with (acute) exacerbation: Secondary | ICD-10-CM | POA: Diagnosis present

## 2015-04-24 DIAGNOSIS — R0789 Other chest pain: Secondary | ICD-10-CM | POA: Diagnosis not present

## 2015-04-24 DIAGNOSIS — F1110 Opioid abuse, uncomplicated: Secondary | ICD-10-CM | POA: Diagnosis present

## 2015-04-24 DIAGNOSIS — J189 Pneumonia, unspecified organism: Secondary | ICD-10-CM | POA: Diagnosis present

## 2015-04-24 DIAGNOSIS — F329 Major depressive disorder, single episode, unspecified: Secondary | ICD-10-CM | POA: Diagnosis present

## 2015-04-24 DIAGNOSIS — J9621 Acute and chronic respiratory failure with hypoxia: Secondary | ICD-10-CM | POA: Diagnosis present

## 2015-04-24 DIAGNOSIS — Z888 Allergy status to other drugs, medicaments and biological substances status: Secondary | ICD-10-CM

## 2015-04-24 DIAGNOSIS — M549 Dorsalgia, unspecified: Secondary | ICD-10-CM

## 2015-04-24 DIAGNOSIS — Z91018 Allergy to other foods: Secondary | ICD-10-CM

## 2015-04-24 DIAGNOSIS — R42 Dizziness and giddiness: Secondary | ICD-10-CM | POA: Diagnosis not present

## 2015-04-24 DIAGNOSIS — Z981 Arthrodesis status: Secondary | ICD-10-CM

## 2015-04-24 DIAGNOSIS — F419 Anxiety disorder, unspecified: Secondary | ICD-10-CM | POA: Diagnosis present

## 2015-04-24 DIAGNOSIS — R0689 Other abnormalities of breathing: Secondary | ICD-10-CM | POA: Diagnosis not present

## 2015-04-24 DIAGNOSIS — J383 Other diseases of vocal cords: Principal | ICD-10-CM | POA: Diagnosis present

## 2015-04-24 DIAGNOSIS — I10 Essential (primary) hypertension: Secondary | ICD-10-CM | POA: Diagnosis present

## 2015-04-24 DIAGNOSIS — G8929 Other chronic pain: Secondary | ICD-10-CM

## 2015-04-24 DIAGNOSIS — R058 Other specified cough: Secondary | ICD-10-CM

## 2015-04-24 DIAGNOSIS — M545 Low back pain: Secondary | ICD-10-CM | POA: Diagnosis present

## 2015-04-24 DIAGNOSIS — R0603 Acute respiratory distress: Secondary | ICD-10-CM

## 2015-04-24 DIAGNOSIS — Z825 Family history of asthma and other chronic lower respiratory diseases: Secondary | ICD-10-CM

## 2015-04-24 DIAGNOSIS — F418 Other specified anxiety disorders: Secondary | ICD-10-CM | POA: Diagnosis present

## 2015-04-24 DIAGNOSIS — F32A Depression, unspecified: Secondary | ICD-10-CM | POA: Diagnosis present

## 2015-04-24 DIAGNOSIS — F410 Panic disorder [episodic paroxysmal anxiety] without agoraphobia: Secondary | ICD-10-CM | POA: Diagnosis present

## 2015-04-24 DIAGNOSIS — R062 Wheezing: Secondary | ICD-10-CM | POA: Diagnosis not present

## 2015-04-24 DIAGNOSIS — F411 Generalized anxiety disorder: Secondary | ICD-10-CM | POA: Diagnosis present

## 2015-04-24 DIAGNOSIS — F1910 Other psychoactive substance abuse, uncomplicated: Secondary | ICD-10-CM | POA: Diagnosis present

## 2015-04-24 DIAGNOSIS — R079 Chest pain, unspecified: Secondary | ICD-10-CM | POA: Diagnosis not present

## 2015-04-24 DIAGNOSIS — E1165 Type 2 diabetes mellitus with hyperglycemia: Secondary | ICD-10-CM | POA: Diagnosis present

## 2015-04-24 DIAGNOSIS — F1010 Alcohol abuse, uncomplicated: Secondary | ICD-10-CM | POA: Diagnosis present

## 2015-04-24 MED ORDER — LORAZEPAM 2 MG/ML IJ SOLN
INTRAMUSCULAR | Status: AC
  Filled 2015-04-24: qty 1

## 2015-04-24 MED ORDER — ROCURONIUM BROMIDE 50 MG/5ML IV SOLN
INTRAVENOUS | Status: AC
  Filled 2015-04-24: qty 2

## 2015-04-24 MED ORDER — ALBUTEROL (5 MG/ML) CONTINUOUS INHALATION SOLN
10.0000 mg/h | INHALATION_SOLUTION | Freq: Once | RESPIRATORY_TRACT | Status: AC
  Administered 2015-04-24: 10 mg/h via RESPIRATORY_TRACT
  Filled 2015-04-24: qty 20

## 2015-04-24 MED ORDER — LIDOCAINE HCL (CARDIAC) 20 MG/ML IV SOLN
INTRAVENOUS | Status: DC
Start: 2015-04-24 — End: 2015-04-25
  Filled 2015-04-24: qty 5

## 2015-04-24 MED ORDER — ETOMIDATE 2 MG/ML IV SOLN
INTRAVENOUS | Status: AC
  Filled 2015-04-24: qty 20

## 2015-04-24 MED ORDER — LORAZEPAM 2 MG/ML IJ SOLN
1.0000 mg | INTRAMUSCULAR | Status: AC
  Administered 2015-04-24: 1 mg via INTRAVENOUS

## 2015-04-24 MED ORDER — ALBUTEROL SULFATE (2.5 MG/3ML) 0.083% IN NEBU
5.0000 mg | INHALATION_SOLUTION | Freq: Once | RESPIRATORY_TRACT | Status: AC
  Administered 2015-04-24: 5 mg via RESPIRATORY_TRACT
  Filled 2015-04-24: qty 6

## 2015-04-24 MED ORDER — METHYLPREDNISOLONE SODIUM SUCC 125 MG IJ SOLR
125.0000 mg | Freq: Once | INTRAMUSCULAR | Status: AC
  Administered 2015-04-24: 125 mg via INTRAVENOUS

## 2015-04-24 MED ORDER — SUCCINYLCHOLINE CHLORIDE 20 MG/ML IJ SOLN
INTRAMUSCULAR | Status: DC
Start: 2015-04-24 — End: 2015-04-25
  Filled 2015-04-24: qty 1

## 2015-04-24 MED ORDER — PREDNISONE 10 MG PO TABS: ORAL_TABLET | ORAL | Status: DC

## 2015-04-24 MED ORDER — RACEPINEPHRINE HCL 2.25 % IN NEBU
0.5000 mL | INHALATION_SOLUTION | Freq: Once | RESPIRATORY_TRACT | Status: AC
  Administered 2015-04-24: 0.5 mL via RESPIRATORY_TRACT

## 2015-04-24 MED ORDER — KETAMINE HCL 10 MG/ML IJ SOLN
0.5000 mg/kg | Freq: Once | INTRAMUSCULAR | Status: AC
  Administered 2015-04-24: 40 mg via INTRAVENOUS

## 2015-04-24 MED ORDER — RACEPINEPHRINE HCL 2.25 % IN NEBU
INHALATION_SOLUTION | RESPIRATORY_TRACT | Status: AC
  Administered 2015-04-24: 0.5 mL
  Filled 2015-04-24: qty 0.5

## 2015-04-24 MED ORDER — IPRATROPIUM-ALBUTEROL 0.5-2.5 (3) MG/3ML IN SOLN: 3.0000 mL | RESPIRATORY_TRACT | Status: DC

## 2015-04-24 MED ORDER — METHYLPREDNISOLONE SODIUM SUCC 125 MG IJ SOLR
INTRAMUSCULAR | Status: AC
  Filled 2015-04-24: qty 2

## 2015-04-24 NOTE — ED Provider Notes (Signed)
CSN: [id number]     Arrival date & time 04/24/15  1626 History   First MD Initiated Contact with Patient 04/24/15 1631     Chief Complaint  Patient presents with  . Shortness of Breath  . Asthma   Patient is a 39 y.o. female presenting with general illness. The history is provided by the patient and the EMS personnel. No language interpreter was used.  Illness Location:  NA Quality:  SOB, wheeze Severity:  Moderate Onset quality:  Gradual Timing:  Constant Progression:  Worsening Chronicity:  Recurrent Context:  PMHx of DM, HTN, asthma, anxiety, COPD, and CHF presenting with SOB & wheeze. Onset 2-3 days ago. Progressive worsening. Denies fever, chills, and cough. Previous history of similar symptoms with asthma. Patient notes anxiety and pain as triggers. Patient seen here in the ED with frequency with similar symptoms. EMS gave Solu-Medrol, magnesium, and 2 DuoNeb's. Patient with chronic lower back pain s/p fusions 2 - asking for pain meds in room Associated symptoms: chest pain (tightness), fever (subjective), shortness of breath and wheezing   Associated symptoms: no abdominal pain, no cough and no nausea     Past Medical History  Diagnosis Date  . Asthma   . Anxiety   . COPD (chronic obstructive pulmonary disease)   . Bronchitis   . Heart murmur   . Shortness of breath   . Diabetes mellitus without complication   . Hypertension    Past Surgical History  Procedure Laterality Date  . Spinal fusion    . Tracheostomy  December 2015   Family History  Problem Relation Age of Onset  . HIV Mother   . Heart disease Father   . CVA Father   . Heart disease Other   . Emphysema Maternal Grandmother     smoked  . Asthma Sister   . Clotting disorder Sister   . Clotting disorder Maternal Grandmother   . Lung cancer Maternal Grandmother     smoked   Social History  Substance Use Topics  . Smoking status: Former Smoker -- 2.00 packs/day for 17 years    Types: Cigarettes     Quit date: 06/15/2014  . Smokeless tobacco: Never Used  . Alcohol Use: 0.0 oz/week    0 Standard drinks or equivalent per week     Comment: occasional   OB History    No data available      Review of Systems  Constitutional: Positive for fever (subjective) and chills.  Respiratory: Positive for shortness of breath and wheezing. Negative for cough.   Cardiovascular: Positive for chest pain (tightness).  Gastrointestinal: Negative for nausea and abdominal pain.  All other systems reviewed and are negative.   Allergies  Robitussin dm; Chocolate; Nsaids; Rayon, purified; and Tramadol  Home Medications   Prior to Admission medications   Medication Sig Start Date End Date Taking? Authorizing Provider  albuterol (PROVENTIL) (2.5 MG/3ML) 0.083% nebulizer solution Take 3 mLs by nebulization every 2 (two) hours as needed for wheezing or shortness of breath. 03/28/15   Meredeth Ide, MD  alprazolam Prudy Feeler) 2 MG tablet Take 1 tablet (2 mg total) by mouth 3 (three) times daily as needed for anxiety. 03/28/15   Meredeth Ide, MD  arformoterol (BROVANA) 15 MCG/2ML NEBU Take 2 mLs (15 mcg total) by nebulization 2 (two) times daily. 03/28/15   Meredeth Ide, MD  azelastine (ASTELIN) 0.1 % nasal spray Place 2 sprays into both nostrils 2 (two) times daily. Use in each nostril as  directed Patient not taking: Reported on 01/19/2015 10/31/14   Simonne Martinet, NP  azithromycin (ZITHROMAX) 500 MG tablet Take 1 tablet (500 mg total) by mouth daily. 03/28/15   Meredeth Ide, MD  budesonide (PULMICORT) 0.5 MG/2ML nebulizer solution Take 2 mLs (0.5 mg total) by nebulization 2 (two) times daily. 03/28/15   Meredeth Ide, MD  ENSURE (ENSURE) Take 237 mLs by mouth 3 (three) times daily between meals.    Historical Provider, MD  fluticasone (FLONASE) 50 MCG/ACT nasal spray Place 2 sprays into both nostrils 2 (two) times daily. Patient not taking: Reported on 01/19/2015 10/31/14   Simonne Martinet, NP  ipratropium-albuterol  (DUONEB) 0.5-2.5 (3) MG/3ML SOLN Take 3 mLs by nebulization 2 (two) times daily.  10/22/14   Historical Provider, MD  morphine (MS CONTIN) 30 MG 12 hr tablet Take 1 tablet (30 mg total) by mouth every 12 (twelve) hours. 03/28/15   Meredeth Ide, MD  oxyCODONE (ROXICODONE) 15 MG immediate release tablet Take 1 tablet (15 mg total) by mouth every 6 (six) hours as needed. 03/28/15   Meredeth Ide, MD  pantoprazole (PROTONIX) 40 MG tablet Take 1 tablet (40 mg total) by mouth daily. 03/28/15   Meredeth Ide, MD  Phenyleph-Promethazine-Cod 5-6.25-10 MG/5ML SYRP Take 5 mLs by mouth every 6 (six) hours as needed. 03/28/15   Historical Provider, MD  predniSONE (DELTASONE) 10 MG tablet Prednisone 60 mg po daily x 2 day then Prednisone 50 mg po daily x 2 day then Prednisone 40 mg po daily x 2 day then Prednisone 20 mg daily x 2 days then Prednisone 10 mg po daily x 2 days then stop 04/24/15   Angelina Ok, MD  pregabalin (LYRICA) 25 MG capsule Take 1 capsule (25 mg total) by mouth 2 (two) times daily. 03/28/15   Meredeth Ide, MD  PROAIR HFA 108 (90 BASE) MCG/ACT inhaler Inhale 1-2 puffs into the lungs every 4 (four) hours as needed.  11/04/14   Historical Provider, MD  promethazine-codeine (PHENERGAN WITH CODEINE) 6.25-10 MG/5ML syrup Take 5 mLs by mouth every 6 (six) hours as needed for cough. 03/28/15   Meredeth Ide, MD  QUEtiapine (SEROQUEL) 100 MG tablet Take 1 tablet (100 mg total) by mouth 2 (two) times daily. 03/28/15   Meredeth Ide, MD  zolpidem (AMBIEN) 10 MG tablet Take 10 mg by mouth at bedtime as needed for sleep.    Historical Provider, MD   BP 129/67 mmHg  Pulse 108  Temp(Src) 98.2 F (36.8 C) (Oral)  Resp 20  SpO2 100%  LMP 04/16/2015   Physical Exam  Constitutional: She is oriented to person, place, and time. She appears well-developed and well-nourished. She appears distressed.  HENT:  Head: Normocephalic and atraumatic.  Eyes: Conjunctivae are normal. Pupils are equal, round, and reactive to  light.  Neck: Normal range of motion. Neck supple.  Cardiovascular: Regular rhythm and intact distal pulses.   Pulmonary/Chest: She is in respiratory distress. She has wheezes.  Abdominal: Soft. Bowel sounds are normal. She exhibits no distension. There is no tenderness.  Musculoskeletal: Normal range of motion.  Neurological: She is alert and oriented to person, place, and time.  Skin: Skin is warm and dry. She is not diaphoretic.    ED Course  Procedures   Labs Review Labs Reviewed - No data to display  Imaging Review Dg Chest 2 View  04/24/2015   CLINICAL DATA:  Cough and wheezing.  Painful respirations  EXAM:  CHEST  2 VIEW  COMPARISON:  03/25/2015  FINDINGS: The heart size and mediastinal contours are within normal limits. Both lungs are clear. The visualized skeletal structures are unremarkable.  IMPRESSION: No active cardiopulmonary disease.   Electronically Signed   By: Signa Kell M.D.   On: 04/24/2015 18:23   I have personally reviewed and evaluated these images and lab results as part of my medical decision-making.   EKG Interpretation   Date/Time:  Friday April 24 2015 16:45:56 EDT Ventricular Rate:  93 PR Interval:  170 QRS Duration: 79 QT Interval:  380 QTC Calculation: 473 R Axis:   62 Text Interpretation:  Sinus rhythm No significant change since last  tracing Confirmed by KNAPP  MD-J, JON ([id number]) on 04/24/2015 4:49:25 PM      MDM  Ms. Julie Kerr is a 39 yo female w/ PMHx of DM, HTN, asthma, anxiety, COPD, and CHF presenting with SOB & wheeze. Onset 2-3 days ago. Progressive worsening. Associated with subjective fever and chills. Denies cough. Previous history of similar symptoms with asthma. Patient notes anxiety and pain as triggers. Patient seen here in the ED with frequency with similar symptoms. EMS gave Solu-Medrol, magnesium, and 2 DuoNeb's. Patient with chronic lower back pain s/p fusions 2 - asking for pain meds in room. Of note, patient admitted to our  facility from 03/24/15 to 03/28/15 for asthma exacerbation at which time ENT was consulted and performed a video laryngoscopy which showed paradoxical vocal cord movement which improved with instruction. Per review of primary pulmonology notes they do not have any definitive evidence supporting PMHx of asthma and believe the majority of symptoms in the past have been related to vocal cord dysfunction.  Exam above notable for young female lying in stretcher in mild to moderate distress secondary to pain and wheezing. Afebrile. Heart rate 80s to 90s. Normotensive. Tachypneic to mid 20's. Breathing well on room air and maintaining saturations in high 90s without supplemental oxygen. Wheezing throughout.  Continuous DuoNeb ordered. Chest x-ray showing no evidence of consolidation, edema, or pneumothorax. Percents improved following treatment. So with mild respiratory stridor but has been maintaining action saturations at 100% on room air since arrival and able to speak in full sentences.   Talk with patient at length about the importance of establishing care with a PCP or chronic pain clinic to to get her on a medication regimen to control her chronic back pain. Patient given resources in discharge paperwork. Patient discharged home in stable condition with a prescription for steroid burst. Patient has full supply of albuterol at home. Patient given strict ED return precautions. Patient understands the plan and has no further questions or concerns at this time.  Pt care discussed with and followed by my attending, Dr. Linwood Dibbles   Final diagnoses:  Asthma exacerbation  Vocal cord dysfunction  Chronic back pain    Angelina Ok, MD 04/24/15 1900  Linwood Dibbles, MD 04/24/15 [phone]

## 2015-04-24 NOTE — ED Notes (Signed)
Solumedrol given  STAT

## 2015-04-24 NOTE — ED Notes (Signed)
Critical Care to see patient

## 2015-04-24 NOTE — ED Notes (Addendum)
MD Beaton at bedside. 

## 2015-04-24 NOTE — ED Notes (Signed)
Informed Provider that patient is requesting pain medication.

## 2015-04-24 NOTE — ED Notes (Signed)
MD Radford Pax at  Bedside.

## 2015-04-24 NOTE — ED Notes (Signed)
Provider at the bedside.  

## 2015-04-24 NOTE — ED Notes (Signed)
This RN called patient's wife to report patient is okay.

## 2015-04-24 NOTE — ED Notes (Signed)
Critical Care at bedside.  

## 2015-04-24 NOTE — ED Notes (Addendum)
Pt c/o respiratory distress. Pt unable to talk. Audible wheezing and stridor noted. According to noted patient seen at [facility] today.  MD Radford Pax at bedside.

## 2015-04-24 NOTE — ED Provider Notes (Signed)
CSN: [id number]     Arrival date & time 04/24/15  2215 History   First MD Initiated Contact with Patient 04/24/15 2239     Chief Complaint  Patient presents with  . Respiratory Distress      HPI Patient presents emergency room in severe respiratory distress.  Has inspiratory expiratory stridor.  Use of accessory muscles and tachypnea.  Patient has previous intubations and tracheostomy.  Was seen in [place] and discharged at 5 PM.  At that time was able speak full sentences. Past Medical History  Diagnosis Date  . Asthma   . Anxiety   . COPD (chronic obstructive pulmonary disease)   . Bronchitis   . Heart murmur   . Shortness of breath   . Diabetes mellitus without complication   . Hypertension    Past Surgical History  Procedure Laterality Date  . Spinal fusion    . Tracheostomy  December 2015   Family History  Problem Relation Age of Onset  . HIV Mother   . Heart disease Father   . CVA Father   . Heart disease Other   . Emphysema Maternal Grandmother     smoked  . Asthma Sister   . Clotting disorder Sister   . Clotting disorder Maternal Grandmother   . Lung cancer Maternal Grandmother     smoked   Social History  Substance Use Topics  . Smoking status: Former Smoker -- 2.00 packs/day for 17 years    Types: Cigarettes    Quit date: 06/15/2014  . Smokeless tobacco: Never Used  . Alcohol Use: 0.0 oz/week    0 Standard drinks or equivalent per week     Comment: occasional   OB History    No data available     Review of Systems  Unable to perform ROS: Acuity of condition      Allergies  Robitussin dm; Chocolate; Nsaids; Rayon, purified; and Tramadol  Home Medications   Prior to Admission medications   Medication Sig Start Date End Date Taking? Authorizing Provider  albuterol (PROVENTIL) (2.5 MG/3ML) 0.083% nebulizer solution Take 3 mLs by nebulization every 2 (two) hours as needed for wheezing or shortness of breath. 03/28/15   Meredeth Ide, MD   alprazolam Prudy Feeler) 2 MG tablet Take 1 tablet (2 mg total) by mouth 3 (three) times daily as needed for anxiety. 03/28/15   Meredeth Ide, MD  arformoterol (BROVANA) 15 MCG/2ML NEBU Take 2 mLs (15 mcg total) by nebulization 2 (two) times daily. 03/28/15   Meredeth Ide, MD  azelastine (ASTELIN) 0.1 % nasal spray Place 2 sprays into both nostrils 2 (two) times daily. Use in each nostril as directed Patient not taking: Reported on 01/19/2015 10/31/14   Simonne Martinet, NP  azithromycin (ZITHROMAX) 500 MG tablet Take 1 tablet (500 mg total) by mouth daily. 03/28/15   Meredeth Ide, MD  budesonide (PULMICORT) 0.5 MG/2ML nebulizer solution Take 2 mLs (0.5 mg total) by nebulization 2 (two) times daily. 03/28/15   Meredeth Ide, MD  ENSURE (ENSURE) Take 237 mLs by mouth 3 (three) times daily between meals.    Historical Provider, MD  fluticasone (FLONASE) 50 MCG/ACT nasal spray Place 2 sprays into both nostrils 2 (two) times daily. Patient not taking: Reported on 01/19/2015 10/31/14   Simonne Martinet, NP  ipratropium-albuterol (DUONEB) 0.5-2.5 (3) MG/3ML SOLN Take 3 mLs by nebulization 2 (two) times daily.  10/22/14   Historical Provider, MD  morphine (MS CONTIN) 30 MG  12 hr tablet Take 1 tablet (30 mg total) by mouth every 12 (twelve) hours. 03/28/15   Meredeth Ide, MD  oxyCODONE (ROXICODONE) 15 MG immediate release tablet Take 1 tablet (15 mg total) by mouth every 6 (six) hours as needed. 03/28/15   Meredeth Ide, MD  pantoprazole (PROTONIX) 40 MG tablet Take 1 tablet (40 mg total) by mouth daily. 03/28/15   Meredeth Ide, MD  Phenyleph-Promethazine-Cod 5-6.25-10 MG/5ML SYRP Take 5 mLs by mouth every 6 (six) hours as needed. 03/28/15   Historical Provider, MD  predniSONE (DELTASONE) 10 MG tablet Prednisone 60 mg po daily x 2 day then Prednisone 50 mg po daily x 2 day then Prednisone 40 mg po daily x 2 day then Prednisone 20 mg daily x 2 days then Prednisone 10 mg po daily x 2 days then stop 04/24/15   Angelina Ok, MD   pregabalin (LYRICA) 25 MG capsule Take 1 capsule (25 mg total) by mouth 2 (two) times daily. 03/28/15   Meredeth Ide, MD  PROAIR HFA 108 (90 BASE) MCG/ACT inhaler Inhale 1-2 puffs into the lungs every 4 (four) hours as needed.  11/04/14   Historical Provider, MD  promethazine-codeine (PHENERGAN WITH CODEINE) 6.25-10 MG/5ML syrup Take 5 mLs by mouth every 6 (six) hours as needed for cough. 03/28/15   Meredeth Ide, MD  QUEtiapine (SEROQUEL) 100 MG tablet Take 1 tablet (100 mg total) by mouth 2 (two) times daily. 03/28/15   Meredeth Ide, MD  zolpidem (AMBIEN) 10 MG tablet Take 10 mg by mouth at bedtime as needed for sleep.    Historical Provider, MD   BP 122/64 mmHg  Pulse 119  Resp 33  Ht 5\' 3"  (1.6 m)  Wt 184 lb 11.9 oz (83.8 kg)  BMI 32.73 kg/m2  SpO2 98%  LMP 04/16/2015 Physical Exam  Constitutional: She is oriented to person, place, and time. She appears well-developed and well-nourished. She appears distressed.  HENT:  Head: Normocephalic and atraumatic.  Eyes: Pupils are equal, round, and reactive to light.  Neck: Normal range of motion.  Cardiovascular: Normal rate and intact distal pulses.   Pulmonary/Chest: Accessory muscle usage present. Tachypnea noted. She is in respiratory distress. She has wheezes. She has rhonchi.  Abdominal: Normal appearance. She exhibits no distension.  Musculoskeletal: Normal range of motion.  Neurological: She is alert and oriented to person, place, and time. No cranial nerve deficit.  Skin: Skin is warm and dry. No rash noted.  Psychiatric: She has a normal mood and affect. Her behavior is normal.  Nursing note and vitals reviewed.   ED Course  Procedures (including critical care time) Ketamine 40 mg IV. Ativan 1 mg IV Ativan 1 mg IV Racemic epinephrine via nebulizer. Albuterol via nebulizer.   Discussed with critical care medicine who will come to evaluate patient in the ER. Labs Review Labs Reviewed - No data to display  Imaging  Review Dg Chest 2 View  04/24/2015   CLINICAL DATA:  Cough and wheezing.  Painful respirations  EXAM: CHEST  2 VIEW  COMPARISON:  03/25/2015  FINDINGS: The heart size and mediastinal contours are within normal limits. Both lungs are clear. The visualized skeletal structures are unremarkable.  IMPRESSION: No active cardiopulmonary disease.   Electronically Signed   By: Signa Kell M.D.   On: 04/24/2015 18:23   Dg Chest Portable 1 View  04/24/2015   CLINICAL DATA:  Labored breathing with audible wheezes and stridor.  EXAM: PORTABLE CHEST -  1 VIEW  COMPARISON:  04/24/2015  FINDINGS: Shallow inspiration. Normal heart size and pulmonary vascularity. There is increasing interstitial infiltration in the right lung base suggesting early pneumonia. No blunting of costophrenic angles. No pneumothorax. Mediastinal contours appear intact.  IMPRESSION: Developing interstitial infiltration in the right lung base suggesting pneumonia.   Electronically Signed   By: Burman Nieves M.D.   On: 04/24/2015 23:36   I have personally reviewed and evaluated these images and lab results as part of my medical decision-making.  CRITICAL CARE Performed by: Nelia Shi Total critical care time: 45 min Critical care time was exclusive of separately billable procedures and treating other patients. Critical care was necessary to treat or prevent imminent or life-threatening deterioration. Critical care was time spent personally by me on the following activities: development of treatment plan with patient and/or surrogate as well as nursing, discussions with consultants, evaluation of patient's response to treatment, examination of patient, obtaining history from patient or surrogate, ordering and performing treatments and interventions, ordering and review of laboratory studies, ordering and review of radiographic studies, pulse oximetry and re-evaluation of patient's condition.   MDM   Final diagnoses:  Vocal cord  dysfunction  Respiratory distress        Nelva Nay, MD 04/25/15 0004

## 2015-04-24 NOTE — ED Notes (Signed)
Pt to department via EMS- pt coming in from [facility]. Pt attempted treatment at home. Reports lower back pain that radiates into both legs. Given a duo-neb,  IM. Bp-118/66 HR-84 RA-99% CBG-98 Given second duo-neb. 20 LAC 2g Mag.

## 2015-04-24 NOTE — ED Notes (Addendum)
Pt having labored breathing.  Stridor present.  Heat rate 120 sinus tachycardia with rare PVC. Pt Spo2 97% on with breathing treatment present.

## 2015-04-24 NOTE — ED Notes (Signed)
Pt experiencing less labored breathing, stridor still present. Pt crying and stating to Dr. Radford Pax " Are you going to let me die?" Pt very anxious. Pt reassured we are going to take good care of her. Lights dimmed in efforts to provide a calming effect. Pt maintaing SPO2 at 99 %2 L [place].

## 2015-04-24 NOTE — ED Notes (Signed)
Pt experiencing labored breathing once again. Pt given a  Pillow to brace. Pt now trying to write me  A note wanting me to call her wife to let her know she is okay. Ryssa (wife) number is [phone].

## 2015-04-24 NOTE — Discharge Instructions (Signed)
Asthma Attack Prevention Although there is no way to prevent asthma from starting, you can take steps to control the disease and reduce its symptoms. Learn about your asthma and how to control it. Take an active role to control your asthma by working with your health care provider to create and follow an asthma action plan. An asthma action plan guides you in:  Taking your medicines properly.  Avoiding things that set off your asthma or make your asthma worse (asthma triggers).  Tracking your level of asthma control.  Responding to worsening asthma.  Seeking emergency care when needed. To track your asthma, keep records of your symptoms, check your peak flow number using a handheld device that shows how well air moves out of your lungs (peak flow meter), and get regular asthma checkups.  WHAT ARE SOME WAYS TO PREVENT AN ASTHMA ATTACK?  Take medicines as directed by your health care provider.  Keep track of your asthma symptoms and level of control.  With your health care provider, write a detailed plan for taking medicines and managing an asthma attack. Then be sure to follow your action plan. Asthma is an ongoing condition that needs regular monitoring and treatment.  Identify and avoid asthma triggers. Many outdoor allergens and irritants (such as pollen, mold, cold air, and air pollution) can trigger asthma attacks. Find out what your asthma triggers are and take steps to avoid them.  Monitor your breathing. Learn to recognize warning signs of an attack, such as coughing, wheezing, or shortness of breath. Your lung function may decrease before you notice any signs or symptoms, so regularly measure and record your peak airflow with a home peak flow meter.  Identify and treat attacks early. If you act quickly, you are less likely to have a severe attack. You will also need less medicine to control your symptoms. When your peak flow measurements decrease and alert you to an upcoming attack,  take your medicine as instructed and immediately stop any activity that may have triggered the attack. If your symptoms do not improve, get medical help.  Pay attention to increasing quick-relief inhaler use. If you find yourself relying on your quick-relief inhaler, your asthma is not under control. See your health care provider about adjusting your treatment. WHAT CAN MAKE MY SYMPTOMS WORSE? A number of common things can set off or make your asthma symptoms worse and cause temporary increased inflammation of your airways. Keep track of your asthma symptoms for several weeks, detailing all the environmental and emotional factors that are linked with your asthma. When you have an asthma attack, go back to your asthma diary to see which factor, or combination of factors, might have contributed to it. Once you know what these factors are, you can take steps to control many of them. If you have allergies and asthma, it is important to take asthma prevention steps at home. Minimizing contact with the substance to which you are allergic will help prevent an asthma attack. Some triggers and ways to avoid these triggers are: Animal Dander:  Some people are allergic to the flakes of skin or dried saliva from animals with fur or feathers.   There is no such thing as a hypoallergenic dog or cat breed. All dogs or cats can cause allergies, even if they don't shed.  Keep these pets out of your home.  If you are not able to keep a pet outdoors, keep the pet out of your bedroom and other sleeping areas at all  times, and keep the door closed. °· Remove carpets and furniture covered with cloth from your home. If that is not possible, keep the pet away from fabric-covered furniture and carpets. °Dust Mites: °Many people with asthma are allergic to dust mites. Dust mites are tiny bugs that are found in every home in mattresses, pillows, carpets, fabric-covered furniture, bedcovers, clothes, stuffed toys, and other  fabric-covered items.  °· Cover your mattress in a special dust-proof cover. °· Cover your pillow in a special dust-proof cover, or wash the pillow each week in hot water. Water must be hotter than 130° F (54.4° C) to kill dust mites. Cold or warm water used with detergent and bleach can also be effective. °· Wash the sheets and blankets on your bed each week in hot water. °· Try not to sleep or lie on cloth-covered cushions. °· Call ahead when traveling and ask for a smoke-free hotel room. Bring your own bedding and pillows in case the hotel only supplies feather pillows and down comforters, which may contain dust mites and cause asthma symptoms. °· Remove carpets from your bedroom and those laid on concrete, if you can. °· Keep stuffed toys out of the bed, or wash the toys weekly in hot water or cooler water with detergent and bleach. °Cockroaches: °Many people with asthma are allergic to the droppings and remains of cockroaches.  °· Keep food and garbage in closed containers. Never leave food out. °· Use poison baits, traps, powders, gels, or paste (for example, boric acid). °· If a spray is used to kill cockroaches, stay out of the room until the odor goes away. °Indoor Mold: °· Fix leaky faucets, pipes, or other sources of water that have mold around them. °· Clean floors and moldy surfaces with a fungicide or diluted bleach. °· Avoid using humidifiers, vaporizers, or swamp coolers. These can spread molds through the air. °Pollen and Outdoor Mold: °· When pollen or mold spore counts are high, try to keep your windows closed. °· Stay indoors with windows closed from late morning to afternoon. Pollen and some mold spore counts are highest at that time. °· Ask your health care provider whether you need to take anti-inflammatory medicine or increase your dose of the medicine before your allergy season starts. °Other Irritants to Avoid: °· Tobacco smoke is an irritant. If you smoke, ask your health care provider how  you can quit. Ask family members to quit smoking, too. Do not allow smoking in your home or car. °· If possible, do not use a wood-burning stove, kerosene heater, or fireplace. Minimize exposure to all sources of smoke, including incense, candles, fires, and fireworks. °· Try to stay away from strong odors and sprays, such as perfume, talcum powder, hair spray, and paints. °· Decrease humidity in your home and use an indoor air cleaning device. Reduce indoor humidity to below 60%. Dehumidifiers or central air conditioners can do this. °· Decrease house dust exposure by changing furnace and air cooler filters frequently. °· Try to have someone else vacuum for you once or twice a week. Stay out of rooms while they are being vacuumed and for a short while afterward. °· If you vacuum, use a dust mask from a hardware store, a double-layered or microfilter vacuum cleaner bag, or a vacuum cleaner with a HEPA filter. °· Sulfites in foods and beverages can be irritants. Do not drink beer or wine or eat dried fruit, processed potatoes, or shrimp if they cause asthma symptoms. °· Cold   air can trigger an asthma attack. Cover your nose and mouth with a scarf on cold or windy days.  Several health conditions can make asthma more difficult to manage, including a runny nose, sinus infections, reflux disease, psychological stress, and sleep apnea. Work with your health care provider to manage these conditions.  Avoid close contact with people who have a respiratory infection such as a cold or the flu, since your asthma symptoms may get worse if you catch the infection. Wash your hands thoroughly after touching items that may have been handled by people with a respiratory infection.  Get a flu shot every year to protect against the flu virus, which often makes asthma worse for days or weeks. Also get a pneumonia shot if you have not previously had one. Unlike the flu shot, the pneumonia shot does not need to be given  yearly. Medicines:  Talk to your health care provider about whether it is safe for you to take aspirin or non-steroidal anti-inflammatory medicines (NSAIDs). In a small number of people with asthma, aspirin and NSAIDs can cause asthma attacks. These medicines must be avoided by people who have known aspirin-sensitive asthma. It is important that people with aspirin-sensitive asthma read labels of all over-the-counter medicines used to treat pain, colds, coughs, and fever.  Beta-blockers and ACE inhibitors are other medicines you should discuss with your health care provider. HOW CAN I FIND OUT WHAT I AM ALLERGIC TO? Ask your asthma health care provider about allergy skin testing or blood testing (the RAST test) to identify the allergens to which you are sensitive. If you are found to have allergies, the most important thing to do is to try to avoid exposure to any allergens that you are sensitive to as much as possible. Other treatments for allergies, such as medicines and allergy shots (immunotherapy) are available.  CAN I EXERCISE? Follow your health care provider's advice regarding asthma treatment before exercising. It is important to maintain a regular exercise program, but vigorous exercise or exercise in cold, humid, or dry environments can cause asthma attacks, especially for those people who have exercise-induced asthma. Document Released: 07/20/2009 Document Revised: 08/06/2013 Document Reviewed: 02/06/2013 [facility] Patient Information 2015 [place]. This information is not intended to replace advice given to you by your health care provider. Make sure you discuss any questions you have with your health care provider.   Emergency Department Resource Guide 1) Find a Doctor and Pay Out of Pocket Although you won't have to find out who is covered by your insurance plan, it is a good idea to ask around and get recommendations. You will then need to call the office and see if the doctor  you have chosen will accept you as a new patient and what types of options they offer for patients who are self-pay. Some doctors offer discounts or will set up payment plans for their patients who do not have insurance, but you will need to ask so you aren't surprised when you get to your appointment.  2) Contact Your Local Health Department Not all health departments have doctors that can see patients for sick visits, but many do, so it is worth a call to see if yours does. If you don't know where your local health department is, you can check in your phone book. The CDC also has a tool to help you locate your state's health department, and many state websites also have listings of all of their local health departments.  3) Find  a Walk-in Clinic If your illness is not likely to be very severe or complicated, you may want to try a walk in clinic. These are popping up all over the country in pharmacies, drugstores, and shopping centers. They're usually staffed by nurse practitioners or physician assistants that have been trained to treat common illnesses and complaints. They're usually fairly quick and inexpensive. However, if you have serious medical issues or chronic medical problems, these are probably not your best option.  No Primary Care Doctor: - Call Health Connect at  [phone] - they can help you locate a primary care doctor that  accepts your insurance, provides certain services, etc. - Physician Referral Service- [phone]  Chronic Pain Problems: Organization         Address  Phone   Notes  [facility]  [phone] Patients need to be referred by their primary care doctor.   Medication Assistance: Organization         Address  Phone   Notes  [facility] [address] [place], [place] [address] [phone]) [phone] --Must be a resident of [facility] [place] -- Must have NO insurance coverage whatsoever (no Medicaid/  Medicare, etc.) -- The pt. MUST have a primary care doctor that directs their care regularly and follows them in the community   Med[facility]ist  [phone]   [employer]  [phone]) [phone]    Agencies that provide inexpensive medical care: Organization         Address  Phone   Notes  [facility]  [phone]   [facility]    [phone]   [facility] [address] [place], [place] [address] [phone]   [facility] [address], [place] [phone]) [phone]   [facility]anned Parenthood    [phone]) [phone]   [facility]    [phone](336) [phone]   [facility]  [address], [place] Phone:  [phone]) [phone], Fax:  [phone] Hours of Operation:  9 am - 6 pm, M-F.  Also accepts Medicaid/Medicare and self-pay.  [facility]  [address], [place] Phone: [phone]) [phone], Fax: [phone]. Hours of Operation:  8:30 am - 5:30 pm, M-F.  Also accepts Medicaid and self-pay.  [facility] [place] [address], [place] Phone: [phone]   [facility] [address], [place], [place] [phone], Ext. [phone] Mondays & Thursdays: 7-9 AM.  First 15 patients are seen on a first come, first serve basis.    Medicaid-accepting [facility] Providers:  Organization         Address  Phone   Notes  [facility] [name], [place] [phone]) [phone] Also accepts self-pay patients.  [facility] [address] [place], [place]  [phone]) [phone]   [facility] [address], [place] [phone]) [phone]   [facility] [address] [place] [address], [place] [phone]) [phone]   Renaye RakersVeita Bland [address], [place]   [phone] Only accepts [place] Access [place] patients after they have their name applied to their card.    Self-Pay (no insurance) in [facility] [place]:  Organization         Address  Phone   Notes  Sickle Cell Patients, [facility] [address], [place] [phone]   °[facility] [address], [place] [phone]   °[facility] [place] ° [address],  [phone]   °[facility]/Dr. Osei-Bonsu ° [address] [place] [address], [place] or [address], [place] [phone] Phone number for both [place] and [place] locations is the same.  °[facility] [address], [place] [phone]   °[facility] [address] [place] [address], [place] or [address] [phone] °([phone]   °[facility] [address], [place] [phone], phone; [phone], fax Sees patients 1st and 3rd Saturday of every month.  Must not qualify for public or private insurance (i.e. Medicaid, Medicare, [place] Health Choice, Veterans' Benefits) • Household income should be no more than 200% of the poverty level •The clinic cannot treat you if you are pregnant or think you are pregnant • Sexually transmitted diseases are not treated at the clinic.  ° ° °Dental Care: °Organization         Address  Phone  Notes  °[place] [facility] [address], [place] [phone] Accepts children up to age 21 who are enrolled in Medicaid or [place] Health Choice; pregnant women with a Medicaid card; and children who have applied for Medicaid or [place] Health Choice, but were declined, whose parents can pay a reduced fee at time of service.  °[place] [facility] [place]  [address], [place] [phone] Accepts children up to age 21 who are enrolled in Medicaid or [place] Health Choice; pregnant women with a Medicaid card; and children who have applied for Medicaid or [place] Health Choice,  but were declined, whose parents can pay a reduced fee at time of service.  °[facility] ° [address], [place] [phone] Patients are seen by appointment only. Walk-ins are not accepted. [facility] will see patients 18 years of age and older. °Monday - Tuesday (8am-5pm) °Most Wednesdays (8:30-5pm) °$30 per visit, cash only  °[facility] ° [address], [place] [phone] Patients are seen by appointment only. Walk-ins are not accepted. [facility] will see patients 18 years of age and older. °One Wednesday Evening (Monthly: Volunteer Based).  $30 per visit, cash only  °[facility]  [phone] for adults; Children under age 4, call Graduate Pediatric Dentistry at [phone]. Children aged 4-14, please call [phone] to request a pediatric application. ° Dental services are provided in all areas of dental care including fillings, crowns and bridges, complete and partial dentures, implants, gum treatment, root canals, and extractions. Preventive care is also provided. Treatment is provided to both adults and children. °Patients are selected via a lottery and there is often a waiting list. °  °[facility] [address], ° ° ([phone] [email or web address] °  °[facility] [address], [place], [place] [phone], Ext. [phone] Second and Fourth Thursday of each month, opens at 6:30 AM; Clinic ends at 9 AM.  Patients are seen on a first-come first-served basis, and a limited number are seen during each clinic.  ° °[facility] ° [address], [place], [place] [phone]   Eligibility Requirements °You must have lived in [place], [place], or [place]   counties for at least the last three months.   You cannot be eligible for state or federal sponsored [place], including [employer], [place], or [employer].   You generally  cannot be eligible for healthcare insurance through your employer.    How to apply: Eligibility screenings are held every Tuesday and Wednesday afternoon from 1:00 pm until 4:00 pm. You do not need an appointment for the interview!  [facility] [address], [place], [place] [phone]   [facility]  [phone]   [facility] Department  [phone]   [facility] Health Department  [phone]    Behavioral Health Resources in the Community: Intensive Outpatient Programs Organization         Address  Phone  Notes  [facility] [address], [place], [place] [phone]   [facility] Outpatient [address], [place], [place] [phone]   [facility] [address], [place], [place]  [phone]   [facility] [address]  [place], [place] [phone] or [phone]   Substance Abuse Resources Organization         Address  Phone  Notes  [facility]  [phone]   [facility]  [phone]   [facility] [place]  [phone]   Floydene Flock  [phone]   Residential & Outpatient Substance Abuse Program  5101004973   Psychological Services Organization         Address  Phone  Notes  [facility]  [phone]   [facility]  [phone]   [facility] [address] [place] [phone] or [phone]    Mobile Crisis Teams Organization         Address  Phone  Notes  [facility], Mobile Crisis Care Unit  [phone]   [facility]  [address] [place], [place] [phone]   Doristine Locks [address] [place] [phone]    Self-Help/Support Groups Organization         Address  Phone             Notes  [facility] [place] - variety of support groups  336- I7437963 Call for more  information  Narcotics Anonymous (NA), [facility] [address], [employer] [place]  2 meetings at this location   Statistician         Address  Phone  Notes  [facility] [address],    [place]  [phone]   [facility]  [address] [place] [address], [place], [place] [phone]   [facility] [address] [place], [place] [phone] Admissions: 8am-3pm M-F  [facility] [address],    [place], [place] [phone]   [name] [address] [place], [place], [place] [phone]   [facility] [address],  [place], [place] [phone]   [facility] - Intensive Outpatient [address], [place], [place] [phone]   [facility] [address] [address]  [place], [place] [phone] or [phone]   [facility] ([facility]) [address] [place], [place] [phone] Accepts Medicaid  [place] [address]  [place] [phone] Substance Abuse/Addiction Treatment   [facility] Resources Organization         Address  Phone  Notes  Estate manager/land agent Services  (  888) [phone]   Angie FavaJulie Brannon, PhD [address], [address] [place], [place]   [phone](336) [phone] or [phone](336) [phone]   [facility]   [address] [place], [place] [phone](336) [phone]   [facility] [address], [place], [place] [phone]) [phone] Insurance/Medicaid/sponsorship through [facility]  Faith and [phone] [address]                                    [place], [place] [phone]) [phone] Therapy/tele-psych/case  [facility] [address]   [place], [place] [phone]) [phone]    Dr. Lolly MustacheArfeen  [phone]) [phone]   [facility]  [employer] [facility] 1) [address], [place] 2) [address], [place] 3)  [address], [place] [phone](336)  [phone]) [phone]  [phone]) [phone]   [facility] Child Abuse Hotline [phone](336) [phone] or [phone](336) [phone] (After Hours)

## 2015-04-24 NOTE — ED Notes (Signed)
MD Radford Pax at bedside pushing Ketamine

## 2015-04-25 DIAGNOSIS — M545 Low back pain: Secondary | ICD-10-CM | POA: Diagnosis not present

## 2015-04-25 DIAGNOSIS — F1910 Other psychoactive substance abuse, uncomplicated: Secondary | ICD-10-CM | POA: Diagnosis present

## 2015-04-25 DIAGNOSIS — Z981 Arthrodesis status: Secondary | ICD-10-CM | POA: Diagnosis not present

## 2015-04-25 DIAGNOSIS — I10 Essential (primary) hypertension: Secondary | ICD-10-CM | POA: Diagnosis present

## 2015-04-25 DIAGNOSIS — G4700 Insomnia, unspecified: Secondary | ICD-10-CM | POA: Diagnosis present

## 2015-04-25 DIAGNOSIS — F411 Generalized anxiety disorder: Secondary | ICD-10-CM | POA: Diagnosis not present

## 2015-04-25 DIAGNOSIS — Z825 Family history of asthma and other chronic lower respiratory diseases: Secondary | ICD-10-CM | POA: Diagnosis not present

## 2015-04-25 DIAGNOSIS — Z7951 Long term (current) use of inhaled steroids: Secondary | ICD-10-CM | POA: Diagnosis not present

## 2015-04-25 DIAGNOSIS — R0902 Hypoxemia: Secondary | ICD-10-CM | POA: Diagnosis present

## 2015-04-25 DIAGNOSIS — Z87891 Personal history of nicotine dependence: Secondary | ICD-10-CM | POA: Diagnosis not present

## 2015-04-25 DIAGNOSIS — F419 Anxiety disorder, unspecified: Secondary | ICD-10-CM | POA: Diagnosis not present

## 2015-04-25 DIAGNOSIS — G894 Chronic pain syndrome: Secondary | ICD-10-CM | POA: Diagnosis present

## 2015-04-25 DIAGNOSIS — Z79891 Long term (current) use of opiate analgesic: Secondary | ICD-10-CM | POA: Diagnosis not present

## 2015-04-25 DIAGNOSIS — R05 Cough: Secondary | ICD-10-CM | POA: Diagnosis not present

## 2015-04-25 DIAGNOSIS — J45901 Unspecified asthma with (acute) exacerbation: Secondary | ICD-10-CM | POA: Diagnosis not present

## 2015-04-25 DIAGNOSIS — J189 Pneumonia, unspecified organism: Secondary | ICD-10-CM | POA: Diagnosis not present

## 2015-04-25 DIAGNOSIS — R739 Hyperglycemia, unspecified: Secondary | ICD-10-CM | POA: Diagnosis not present

## 2015-04-25 DIAGNOSIS — Z91018 Allergy to other foods: Secondary | ICD-10-CM | POA: Diagnosis not present

## 2015-04-25 DIAGNOSIS — F418 Other specified anxiety disorders: Secondary | ICD-10-CM | POA: Diagnosis present

## 2015-04-25 DIAGNOSIS — M25512 Pain in left shoulder: Secondary | ICD-10-CM | POA: Diagnosis not present

## 2015-04-25 DIAGNOSIS — R102 Pelvic and perineal pain: Secondary | ICD-10-CM | POA: Diagnosis not present

## 2015-04-25 DIAGNOSIS — J441 Chronic obstructive pulmonary disease with (acute) exacerbation: Secondary | ICD-10-CM | POA: Diagnosis not present

## 2015-04-25 DIAGNOSIS — Z79899 Other long term (current) drug therapy: Secondary | ICD-10-CM | POA: Diagnosis not present

## 2015-04-25 DIAGNOSIS — J45909 Unspecified asthma, uncomplicated: Secondary | ICD-10-CM | POA: Diagnosis present

## 2015-04-25 DIAGNOSIS — Z886 Allergy status to analgesic agent status: Secondary | ICD-10-CM | POA: Diagnosis not present

## 2015-04-25 DIAGNOSIS — F410 Panic disorder [episodic paroxysmal anxiety] without agoraphobia: Secondary | ICD-10-CM | POA: Diagnosis present

## 2015-04-25 DIAGNOSIS — R0600 Dyspnea, unspecified: Secondary | ICD-10-CM | POA: Diagnosis not present

## 2015-04-25 DIAGNOSIS — F1110 Opioid abuse, uncomplicated: Secondary | ICD-10-CM | POA: Diagnosis present

## 2015-04-25 DIAGNOSIS — E1165 Type 2 diabetes mellitus with hyperglycemia: Secondary | ICD-10-CM | POA: Diagnosis not present

## 2015-04-25 DIAGNOSIS — F1010 Alcohol abuse, uncomplicated: Secondary | ICD-10-CM | POA: Diagnosis present

## 2015-04-25 DIAGNOSIS — J383 Other diseases of vocal cords: Secondary | ICD-10-CM | POA: Diagnosis not present

## 2015-04-25 DIAGNOSIS — Z888 Allergy status to other drugs, medicaments and biological substances status: Secondary | ICD-10-CM | POA: Diagnosis not present

## 2015-04-25 DIAGNOSIS — F1994 Other psychoactive substance use, unspecified with psychoactive substance-induced mood disorder: Secondary | ICD-10-CM | POA: Diagnosis not present

## 2015-04-25 DIAGNOSIS — R0602 Shortness of breath: Secondary | ICD-10-CM | POA: Diagnosis not present

## 2015-04-25 DIAGNOSIS — J9621 Acute and chronic respiratory failure with hypoxia: Secondary | ICD-10-CM | POA: Diagnosis not present

## 2015-04-25 LAB — PHOSPHORUS: PHOSPHORUS: 2.8 mg/dL (ref 2.5–4.6)

## 2015-04-25 LAB — CBC
HCT: 35 % — ABNORMAL LOW (ref 36.0–46.0)
HEMATOCRIT: 31.4 % — AB (ref 36.0–46.0)
HEMOGLOBIN: 11.1 g/dL — AB (ref 12.0–15.0)
Hemoglobin: 9.9 g/dL — ABNORMAL LOW (ref 12.0–15.0)
MCH: 25.5 pg — ABNORMAL LOW (ref 26.0–34.0)
MCH: 24.7 pg — ABNORMAL LOW (ref 26.0–34.0)
MCHC: 31.7 g/dL (ref 30.0–36.0)
MCHC: 31.5 g/dL (ref 30.0–36.0)
MCV: 80.3 fL (ref 78.0–100.0)
MCV: 78.3 fL (ref 78.0–100.0)
Platelets: 417 10*3/uL — ABNORMAL HIGH (ref 150–400)
Platelets: 337 10*3/uL (ref 150–400)
RBC: 4.36 MIL/uL (ref 3.87–5.11)
RBC: 4.01 MIL/uL (ref 3.87–5.11)
RDW: 15.1 % (ref 11.5–15.5)
RDW: 14.9 % (ref 11.5–15.5)
WBC: 16.4 10*3/uL — ABNORMAL HIGH (ref 4.0–10.5)
WBC: 17.8 10*3/uL — AB (ref 4.0–10.5)

## 2015-04-25 LAB — MAGNESIUM
MAGNESIUM: 2.2 mg/dL (ref 1.7–2.4)
MAGNESIUM: 2.6 mg/dL — AB (ref 1.7–2.4)
MAGNESIUM: 2.5 mg/dL — AB (ref 1.7–2.4)
Magnesium: 2.3 mg/dL (ref 1.7–2.4)
Magnesium: 2.2 mg/dL (ref 1.7–2.4)
Magnesium: 2.2 mg/dL (ref 1.7–2.4)

## 2015-04-25 LAB — BASIC METABOLIC PANEL
ANION GAP: 14 (ref 5–15)
Anion gap: 10 (ref 5–15)
BUN: 10 mg/dL (ref 6–20)
BUN: 8 mg/dL (ref 6–20)
CHLORIDE: 105 mmol/L (ref 101–111)
CHLORIDE: 109 mmol/L (ref 101–111)
CO2: 17 mmol/L — ABNORMAL LOW (ref 22–32)
CO2: 18 mmol/L — ABNORMAL LOW (ref 22–32)
Calcium: 9.6 mg/dL (ref 8.9–10.3)
Calcium: 9.2 mg/dL (ref 8.9–10.3)
Creatinine, Ser: 0.9 mg/dL (ref 0.44–1.00)
Creatinine, Ser: 0.8 mg/dL (ref 0.44–1.00)
GFR calc Af Amer: >60 mL/min (ref >60)
GFR calc non Af Amer: >60 mL/min (ref >60)
GFR calc non Af Amer: >60 mL/min (ref >60)
GLUCOSE: 190 mg/dL — AB (ref 65–99)
Glucose, Bld: 311 mg/dL — ABNORMAL HIGH (ref 65–99)
POTASSIUM: 3.7 mmol/L (ref 3.5–5.1)
POTASSIUM: 4.4 mmol/L (ref 3.5–5.1)
SODIUM: 136 mmol/L (ref 135–145)
SODIUM: 137 mmol/L (ref 135–145)

## 2015-04-25 LAB — GLUCOSE, CAPILLARY: GLUCOSE-CAPILLARY: 243 mg/dL — AB (ref 65–99)

## 2015-04-25 LAB — MRSA PCR SCREENING: MRSA by PCR: NEGATIVE

## 2015-04-25 MED ORDER — FLUTICASONE PROPIONATE 50 MCG/ACT NA SUSP
2.0000 | Freq: Two times a day (BID) | NASAL | Status: DC
  Administered 2015-04-25 – 2015-04-30 (×11): 2 via NASAL
  Filled 2015-04-25 (×2): qty 16

## 2015-04-25 MED ORDER — AZELASTINE HCL 0.1 % NA SOLN
2.0000 | Freq: Two times a day (BID) | NASAL | Status: DC
  Administered 2015-04-25 – 2015-04-30 (×11): 2 via NASAL
  Filled 2015-04-25 (×2): qty 30

## 2015-04-25 MED ORDER — BUDESONIDE 0.5 MG/2ML IN SUSP
0.5000 mg | Freq: Two times a day (BID) | RESPIRATORY_TRACT | Status: DC
Start: 2015-04-25 — End: 2015-04-30
  Administered 2015-04-25 – 2015-04-30 (×12): 0.5 mg via RESPIRATORY_TRACT
  Filled 2015-04-25 (×14): qty 2

## 2015-04-25 MED ORDER — ZOLPIDEM TARTRATE 5 MG PO TABS
5.0000 mg | ORAL_TABLET | Freq: Every evening | ORAL | Status: DC | PRN
  Administered 2015-04-25 – 2015-04-29 (×3): 5 mg via ORAL
  Filled 2015-04-25 (×4): qty 1

## 2015-04-25 MED ORDER — PROMETHAZINE-CODEINE 6.25-10 MG/5ML PO SYRP
5.0000 mL | ORAL_SOLUTION | Freq: Four times a day (QID) | ORAL | Status: DC | PRN
  Administered 2015-04-26 – 2015-04-29 (×7): 5 mL via ORAL
  Filled 2015-04-25 (×8): qty 5

## 2015-04-25 MED ORDER — PHENYLEPH-PROMETHAZINE-COD 5-6.25-10 MG/5ML PO SYRP: 5.0000 mL | ORAL_SOLUTION | Freq: Four times a day (QID) | ORAL | Status: DC | PRN

## 2015-04-25 MED ORDER — PANTOPRAZOLE SODIUM 40 MG PO TBEC
40.0000 mg | DELAYED_RELEASE_TABLET | Freq: Every day | ORAL | Status: DC
  Administered 2015-04-25 – 2015-04-30 (×6): 40 mg via ORAL
  Filled 2015-04-25 (×6): qty 1

## 2015-04-25 MED ORDER — AZITHROMYCIN 500 MG PO TABS: 500.0000 mg | ORAL_TABLET | Freq: Every day | ORAL | Status: DC

## 2015-04-25 MED ORDER — OXYCODONE HCL 5 MG PO TABS
15.0000 mg | ORAL_TABLET | Freq: Four times a day (QID) | ORAL | Status: DC | PRN
  Administered 2015-04-26 – 2015-04-30 (×12): 15 mg via ORAL
  Filled 2015-04-25 (×12): qty 3

## 2015-04-25 MED ORDER — PREGABALIN 25 MG PO CAPS
25.0000 mg | ORAL_CAPSULE | Freq: Two times a day (BID) | ORAL | Status: DC
  Administered 2015-04-25 – 2015-04-30 (×12): 25 mg via ORAL
  Filled 2015-04-25 (×12): qty 1

## 2015-04-25 MED ORDER — QUETIAPINE FUMARATE 25 MG PO TABS
100.0000 mg | ORAL_TABLET | Freq: Two times a day (BID) | ORAL | Status: DC
  Administered 2015-04-25 – 2015-04-29 (×11): 100 mg via ORAL
  Filled 2015-04-25 (×2): qty 1
  Filled 2015-04-25 (×2): qty 4
  Filled 2015-04-25: qty 1
  Filled 2015-04-25 (×3): qty 4
  Filled 2015-04-25 (×2): qty 1
  Filled 2015-04-25: qty 4

## 2015-04-25 MED ORDER — MAGNESIUM SULFATE 4 GM/100ML IV SOLN
4.0000 g | Freq: Once | INTRAVENOUS | Status: AC
  Administered 2015-04-25: 4 g via INTRAVENOUS
  Filled 2015-04-25: qty 100

## 2015-04-25 MED ORDER — ENOXAPARIN SODIUM 40 MG/0.4ML ~~LOC~~ SOLN
40.0000 mg | SUBCUTANEOUS | Status: DC
  Administered 2015-04-25 – 2015-04-30 (×6): 40 mg via SUBCUTANEOUS
  Filled 2015-04-25 (×7): qty 0.4

## 2015-04-25 MED ORDER — INSULIN ASPART 100 UNIT/ML ~~LOC~~ SOLN
0.0000 [IU] | SUBCUTANEOUS | Status: DC
  Administered 2015-04-25 – 2015-04-26 (×3): 7 [IU] via SUBCUTANEOUS
  Administered 2015-04-26: 15 [IU] via SUBCUTANEOUS
  Administered 2015-04-26 (×2): 7 [IU] via SUBCUTANEOUS
  Administered 2015-04-26: 15 [IU] via SUBCUTANEOUS
  Administered 2015-04-27: 7 [IU] via SUBCUTANEOUS
  Administered 2015-04-27: 3 [IU] via SUBCUTANEOUS
  Administered 2015-04-27: 11 [IU] via SUBCUTANEOUS
  Administered 2015-04-27 (×3): 4 [IU] via SUBCUTANEOUS
  Administered 2015-04-28: 7 [IU] via SUBCUTANEOUS
  Administered 2015-04-28: 3 [IU] via SUBCUTANEOUS
  Administered 2015-04-28: 4 [IU] via SUBCUTANEOUS
  Administered 2015-04-28: 3 [IU] via SUBCUTANEOUS

## 2015-04-25 MED ORDER — IPRATROPIUM-ALBUTEROL 0.5-2.5 (3) MG/3ML IN SOLN
3.0000 mL | Freq: Four times a day (QID) | RESPIRATORY_TRACT | Status: DC
  Administered 2015-04-25 (×3): 3 mL via RESPIRATORY_TRACT
  Filled 2015-04-25 (×3): qty 3

## 2015-04-25 MED ORDER — ZOLPIDEM TARTRATE 5 MG PO TABS: 10.0000 mg | ORAL_TABLET | Freq: Every evening | ORAL | Status: DC | PRN

## 2015-04-25 MED ORDER — ARFORMOTEROL TARTRATE 15 MCG/2ML IN NEBU
15.0000 ug | INHALATION_SOLUTION | Freq: Two times a day (BID) | RESPIRATORY_TRACT | Status: DC
  Administered 2015-04-25 – 2015-04-30 (×9): 15 ug via RESPIRATORY_TRACT
  Filled 2015-04-25 (×14): qty 2

## 2015-04-25 MED ORDER — ALPRAZOLAM 0.5 MG PO TABS
2.0000 mg | ORAL_TABLET | Freq: Three times a day (TID) | ORAL | Status: DC | PRN
  Administered 2015-04-25 – 2015-04-26 (×4): 2 mg via ORAL
  Filled 2015-04-25 (×4): qty 4

## 2015-04-25 MED ORDER — LORAZEPAM 2 MG/ML IJ SOLN
2.0000 mg | INTRAMUSCULAR | Status: AC
  Administered 2015-04-25: 2 mg via INTRAVENOUS
  Filled 2015-04-25: qty 1

## 2015-04-25 MED ORDER — IPRATROPIUM-ALBUTEROL 0.5-2.5 (3) MG/3ML IN SOLN
3.0000 mL | Freq: Two times a day (BID) | RESPIRATORY_TRACT | Status: DC
Start: 2015-04-25 — End: 2015-04-25
  Administered 2015-04-25: 3 mL via RESPIRATORY_TRACT
  Filled 2015-04-25: qty 3

## 2015-04-25 MED ORDER — MORPHINE SULFATE ER 30 MG PO TBCR
30.0000 mg | EXTENDED_RELEASE_TABLET | Freq: Two times a day (BID) | ORAL | Status: DC
  Administered 2015-04-25 – 2015-04-30 (×12): 30 mg via ORAL
  Filled 2015-04-25 (×2): qty 1
  Filled 2015-04-25: qty 2
  Filled 2015-04-25 (×10): qty 1

## 2015-04-25 MED ORDER — SODIUM CHLORIDE 0.9 % IV SOLN: INTRAVENOUS | Status: AC

## 2015-04-25 MED ORDER — ALBUTEROL SULFATE (2.5 MG/3ML) 0.083% IN NEBU: 3.0000 mL | INHALATION_SOLUTION | RESPIRATORY_TRACT | Status: DC | PRN

## 2015-04-25 MED ORDER — ALBUTEROL SULFATE HFA 108 (90 BASE) MCG/ACT IN AERS: 1.0000 | INHALATION_SPRAY | RESPIRATORY_TRACT | Status: DC | PRN

## 2015-04-25 MED ORDER — SODIUM CHLORIDE 0.9 % IV SOLN: 250.0000 mL | INTRAVENOUS | Status: DC | PRN

## 2015-04-25 MED ORDER — DEXTROSE 5 % IV SOLN
1.0000 g | INTRAVENOUS | Status: DC
  Administered 2015-04-25 – 2015-04-26 (×2): 1 g via INTRAVENOUS
  Filled 2015-04-25 (×2): qty 10

## 2015-04-25 MED ORDER — LEVOFLOXACIN IN D5W 750 MG/150ML IV SOLN
750.0000 mg | INTRAVENOUS | Status: DC
  Administered 2015-04-25 – 2015-04-30 (×6): 750 mg via INTRAVENOUS
  Filled 2015-04-25 (×8): qty 150

## 2015-04-25 MED ORDER — METHYLPREDNISOLONE SODIUM SUCC 125 MG IJ SOLR
60.0000 mg | Freq: Four times a day (QID) | INTRAMUSCULAR | Status: DC
  Administered 2015-04-25 – 2015-04-26 (×6): 60 mg via INTRAVENOUS
  Filled 2015-04-25 (×3): qty 0.96
  Filled 2015-04-25: qty 2
  Filled 2015-04-25 (×4): qty 0.96
  Filled 2015-04-25: qty 2
  Filled 2015-04-25: qty 0.96
  Filled 2015-04-25: qty 2

## 2015-04-25 NOTE — ED Notes (Addendum)
[facility] has been called. ETA 2 hours.

## 2015-04-25 NOTE — ED Notes (Signed)
Report given to [facility]. 

## 2015-04-25 NOTE — H&P (Signed)
PULMONARY / CRITICAL CARE MEDICINE   Name: Julie Kerr MRN: [id number] DOB: 26-Feb-1976    ADMISSION DATE:  04/24/2015  CHIEF COMPLAINT:  SOB  HISTORY OF PRESENT ILLNESS:   89F with h/o asthma and VCD who presented to [facility] ED today after 3 days of progressive wheezing and SOB not responsive to her usually inhalers and nebulized rescue medication.  In the ED at [facility] she was given solumedrol, duonebs and was discharged home.  She then returned to the [facility] ED with symptoms that were persistent if not worse.  She denies any recent sick contacts and/or fevers, chills.  She does c/o of some disurea and cough that has been present for the same 3 days.  She states her home is full of mold and this is a frequent trigger for her asthma.  PAST MEDICAL HISTORY :   has a past medical history of Asthma; Anxiety; COPD (chronic obstructive pulmonary disease); Bronchitis; Heart murmur; Shortness of breath; Diabetes mellitus without complication; and Hypertension.  has past surgical history that includes Spinal fusion and Tracheostomy (December 2015). Prior to Admission medications   Medication Sig Start Date End Date Taking? Authorizing Provider  albuterol (PROVENTIL) (2.5 MG/3ML) 0.083% nebulizer solution Take 3 mLs by nebulization every 2 (two) hours as needed for wheezing or shortness of breath. 03/28/15   Meredeth Ide, MD  alprazolam Prudy Feeler) 2 MG tablet Take 1 tablet (2 mg total) by mouth 3 (three) times daily as needed for anxiety. 03/28/15   Meredeth Ide, MD  arformoterol (BROVANA) 15 MCG/2ML NEBU Take 2 mLs (15 mcg total) by nebulization 2 (two) times daily. 03/28/15   Meredeth Ide, MD  azelastine (ASTELIN) 0.1 % nasal spray Place 2 sprays into both nostrils 2 (two) times daily. Use in each nostril as directed Patient not taking: Reported on 01/19/2015 10/31/14   Simonne Martinet, NP  azithromycin (ZITHROMAX) 500 MG tablet Take 1 tablet (500 mg total) by mouth daily. 03/28/15   Meredeth Ide, MD  budesonide  (PULMICORT) 0.5 MG/2ML nebulizer solution Take 2 mLs (0.5 mg total) by nebulization 2 (two) times daily. 03/28/15   Meredeth Ide, MD  ENSURE (ENSURE) Take 237 mLs by mouth 3 (three) times daily between meals.    Historical Provider, MD  fluticasone (FLONASE) 50 MCG/ACT nasal spray Place 2 sprays into both nostrils 2 (two) times daily. Patient not taking: Reported on 01/19/2015 10/31/14   Simonne Martinet, NP  ipratropium-albuterol (DUONEB) 0.5-2.5 (3) MG/3ML SOLN Take 3 mLs by nebulization 2 (two) times daily.  10/22/14   Historical Provider, MD  morphine (MS CONTIN) 30 MG 12 hr tablet Take 1 tablet (30 mg total) by mouth every 12 (twelve) hours. 03/28/15   Meredeth Ide, MD  oxyCODONE (ROXICODONE) 15 MG immediate release tablet Take 1 tablet (15 mg total) by mouth every 6 (six) hours as needed. 03/28/15   Meredeth Ide, MD  pantoprazole (PROTONIX) 40 MG tablet Take 1 tablet (40 mg total) by mouth daily. 03/28/15   Meredeth Ide, MD  Phenyleph-Promethazine-Cod 5-6.25-10 MG/5ML SYRP Take 5 mLs by mouth every 6 (six) hours as needed. 03/28/15   Historical Provider, MD  predniSONE (DELTASONE) 10 MG tablet Prednisone 60 mg po daily x 2 day then Prednisone 50 mg po daily x 2 day then Prednisone 40 mg po daily x 2 day then Prednisone 20 mg daily x 2 days then Prednisone 10 mg po daily x 2 days then stop 04/24/15  Angelina Ok, MD  pregabalin (LYRICA) 25 MG capsule Take 1 capsule (25 mg total) by mouth 2 (two) times daily. 03/28/15   Meredeth Ide, MD  PROAIR HFA 108 (90 BASE) MCG/ACT inhaler Inhale 1-2 puffs into the lungs every 4 (four) hours as needed.  11/04/14   Historical Provider, MD  promethazine-codeine (PHENERGAN WITH CODEINE) 6.25-10 MG/5ML syrup Take 5 mLs by mouth every 6 (six) hours as needed for cough. 03/28/15   Meredeth Ide, MD  QUEtiapine (SEROQUEL) 100 MG tablet Take 1 tablet (100 mg total) by mouth 2 (two) times daily. 03/28/15   Meredeth Ide, MD  zolpidem (AMBIEN) 10 MG tablet Take 10 mg by mouth at  bedtime as needed for sleep.    Historical Provider, MD   Allergies  Allergen Reactions  . Robitussin Dm [Dextromethorphan-Guaifenesin] Nausea And Vomiting  . Chocolate Hives  . Nsaids Hives  . Rayon, Purified Hives  . Tramadol Hives    FAMILY HISTORY:  indicated that her mother is deceased. She indicated that her father is deceased. She indicated that her other is deceased.  SOCIAL HISTORY:  reports that she quit smoking about 10 months ago. Her smoking use included Cigarettes. She has a 34 pack-year smoking history. She has never used smokeless tobacco. She reports that she drinks alcohol. She reports that she does not use illicit drugs.  REVIEW OF SYSTEMS:  Performed and was negative other then the pertinent positive and negative results in HPI.  SUBJECTIVE:   VITAL SIGNS: Temp:  [98.2 F (36.8 C)] 98.2 F (36.8 C) (09/09 1645) Pulse Rate:  [85-119] 119 (09/09 2315) Resp:  [19-33] 33 (09/09 2315) BP: (107-129)/(60-67) 122/64 mmHg (09/09 2315) SpO2:  [98 %-100 %] 98 % (09/09 2240) Weight:  [83.8 kg (184 lb 11.9 oz)] 83.8 kg (184 lb 11.9 oz) (09/09 2305) HEMODYNAMICS:   VENTILATOR SETTINGS:   INTAKE / OUTPUT: No intake or output data in the 24 hours ending 04/25/15 0017  PHYSICAL EXAMINATION: General:  Mild-mod respiratory distress Neuro:  CN II-XII intact, no focal deficits HEENT:  NCAT, PERLA Cardiovascular:  Tachycardic, regular, no m/r/g Lungs:  Diffuse b/l expiratory and inspiratory wheezing, +dysphonia, + inspiratory stridor Abdomen:  Soft, TTP in suprapubic area.  Normal bowel sounds   LABS:  CBC No results for input(s): WBC, HGB, HCT, PLT in the last 168 hours. Coag's No results for input(s): APTT, INR in the last 168 hours. BMET No results for input(s): NA, K, CL, CO2, BUN, CREATININE, GLUCOSE in the last 168 hours. Electrolytes No results for input(s): CALCIUM, MG, PHOS in the last 168 hours. Sepsis Markers No results for input(s): LATICACIDVEN,  PROCALCITON, O2SATVEN in the last 168 hours. ABG No results for input(s): PHART, PCO2ART, PO2ART in the last 168 hours. Liver Enzymes No results for input(s): AST, ALT, ALKPHOS, BILITOT, ALBUMIN in the last 168 hours. Cardiac Enzymes No results for input(s): TROPONINI, PROBNP in the last 168 hours. Glucose No results for input(s): GLUCAP in the last 168 hours.  Imaging Dg Chest 2 View  04/24/2015   CLINICAL DATA:  Cough and wheezing.  Painful respirations  EXAM: CHEST  2 VIEW  COMPARISON:  03/25/2015  FINDINGS: The heart size and mediastinal contours are within normal limits. Both lungs are clear. The visualized skeletal structures are unremarkable.  IMPRESSION: No active cardiopulmonary disease.   Electronically Signed   By: Signa Kell M.D.   On: 04/24/2015 18:23   Dg Chest Portable 1 View  04/24/2015   CLINICAL  DATA:  Labored breathing with audible wheezes and stridor.  EXAM: PORTABLE CHEST - 1 VIEW  COMPARISON:  04/24/2015  FINDINGS: Shallow inspiration. Normal heart size and pulmonary vascularity. There is increasing interstitial infiltration in the right lung base suggesting early pneumonia. No blunting of costophrenic angles. No pneumothorax. Mediastinal contours appear intact.  IMPRESSION: Developing interstitial infiltration in the right lung base suggesting pneumonia.   Electronically Signed   By: Burman Nieves M.D.   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  PULMONARY A: Etiology as above. P:    - duonebs Q6hours  - methylprednisolone IV  Q6hours starting tomorrow. S/p  today  - continue home medications  - IV Mag x4 gram loading  - lorazepam and IV magnesium gtt for VCD, will try dexmetatomidine if needed for anxiolysis.  - CAP tx below  - Check RVP  - check urine  legionella  - RT to teach patient "sniff breathing" technique  INFECTIOUS A:  CAP on CXR likely trigger for VCD/Asthma. P:   BCx2 04/25/2015 UC 04/25/2015 Sputum: ordered Abx: CTX and levofloxacin, start date9/05/2015, day 1/5   CIWA protocol if e/o EtOH withdrawal: patient drinks 1 bottle of wine 2-3x per week, denies previous withdrawal, no current symtpoms other than anxiety.  Total critical care time: 45 min  Critical care time was exclusive of separately billable procedures and treating other patients.  Critical care was necessary to treat or prevent imminent or life-threatening deterioration.  Critical care was time spent personally by me on the following activities: development of treatment plan with patient and/or surrogate as well as nursing, discussions with consultants, evaluation of patient's response to treatment, examination of patient, obtaining history from patient or surrogate, ordering and performing treatments and interventions, ordering and review of laboratory studies, ordering and review of radiographic studies, pulse oximetry and re-evaluation of patient's condition.   Galvin Proffer, DO., MS [place]   Pulmonary and Critical Care Medicine [facility] Pager: [phone]  04/25/2015, 12:17 AM

## 2015-04-25 NOTE — ED Notes (Signed)
Only 4 mL of Ketamine used  And given by MD Radford Pax. The remaining amount of walked to pharmacy by this RN

## 2015-04-25 NOTE — ED Notes (Signed)
Attempted to call report

## 2015-04-25 NOTE — ED Notes (Signed)
Report given to Lauren RN at [facility] on 2 [facility]

## 2015-04-25 NOTE — H&P (Signed)
PULMONARY / CRITICAL CARE MEDICINE   Name: Julie Kerr MRN: [id number] DOB: 26-Feb-1976    ADMISSION DATE:  04/24/2015  CHIEF COMPLAINT:  SOB  HISTORY OF PRESENT ILLNESS:   89F with h/o asthma and VCD who presented to [facility] ED today after 3 days of progressive wheezing and SOB not responsive to her usually inhalers and nebulized rescue medication.  In the ED at [facility] she was given solumedrol, duonebs and was discharged home.  She then returned to the [facility] ED with symptoms that were persistent if not worse.  She denies any recent sick contacts and/or fevers, chills.  She does c/o of some disurea and cough that has been present for the same 3 days.  She states her home is full of mold and this is a frequent trigger for her asthma.  PAST MEDICAL HISTORY :   has a past medical history of Asthma; Anxiety; COPD (chronic obstructive pulmonary disease); Bronchitis; Heart murmur; Shortness of breath; Diabetes mellitus without complication; and Hypertension.  has past surgical history that includes Spinal fusion and Tracheostomy (December 2015). Prior to Admission medications   Medication Sig Start Date End Date Taking? Authorizing Provider  albuterol (PROVENTIL) (2.5 MG/3ML) 0.083% nebulizer solution Take 3 mLs by nebulization every 2 (two) hours as needed for wheezing or shortness of breath. 03/28/15   Meredeth Ide, MD  alprazolam Prudy Feeler) 2 MG tablet Take 1 tablet (2 mg total) by mouth 3 (three) times daily as needed for anxiety. 03/28/15   Meredeth Ide, MD  arformoterol (BROVANA) 15 MCG/2ML NEBU Take 2 mLs (15 mcg total) by nebulization 2 (two) times daily. 03/28/15   Meredeth Ide, MD  azelastine (ASTELIN) 0.1 % nasal spray Place 2 sprays into both nostrils 2 (two) times daily. Use in each nostril as directed Patient not taking: Reported on 01/19/2015 10/31/14   Simonne Martinet, NP  azithromycin (ZITHROMAX) 500 MG tablet Take 1 tablet (500 mg total) by mouth daily. 03/28/15   Meredeth Ide, MD  budesonide  (PULMICORT) 0.5 MG/2ML nebulizer solution Take 2 mLs (0.5 mg total) by nebulization 2 (two) times daily. 03/28/15   Meredeth Ide, MD  ENSURE (ENSURE) Take 237 mLs by mouth 3 (three) times daily between meals.    Historical Provider, MD  fluticasone (FLONASE) 50 MCG/ACT nasal spray Place 2 sprays into both nostrils 2 (two) times daily. Patient not taking: Reported on 01/19/2015 10/31/14   Simonne Martinet, NP  ipratropium-albuterol (DUONEB) 0.5-2.5 (3) MG/3ML SOLN Take 3 mLs by nebulization 2 (two) times daily.  10/22/14   Historical Provider, MD  morphine (MS CONTIN) 30 MG 12 hr tablet Take 1 tablet (30 mg total) by mouth every 12 (twelve) hours. 03/28/15   Meredeth Ide, MD  oxyCODONE (ROXICODONE) 15 MG immediate release tablet Take 1 tablet (15 mg total) by mouth every 6 (six) hours as needed. 03/28/15   Meredeth Ide, MD  pantoprazole (PROTONIX) 40 MG tablet Take 1 tablet (40 mg total) by mouth daily. 03/28/15   Meredeth Ide, MD  Phenyleph-Promethazine-Cod 5-6.25-10 MG/5ML SYRP Take 5 mLs by mouth every 6 (six) hours as needed. 03/28/15   Historical Provider, MD  predniSONE (DELTASONE) 10 MG tablet Prednisone 60 mg po daily x 2 day then Prednisone 50 mg po daily x 2 day then Prednisone 40 mg po daily x 2 day then Prednisone 20 mg daily x 2 days then Prednisone 10 mg po daily x 2 days then stop 04/24/15  Angelina Ok, MD  pregabalin (LYRICA) 25 MG capsule Take 1 capsule (25 mg total) by mouth 2 (two) times daily. 03/28/15   Meredeth Ide, MD  PROAIR HFA 108 (90 BASE) MCG/ACT inhaler Inhale 1-2 puffs into the lungs every 4 (four) hours as needed.  11/04/14   Historical Provider, MD  promethazine-codeine (PHENERGAN WITH CODEINE) 6.25-10 MG/5ML syrup Take 5 mLs by mouth every 6 (six) hours as needed for cough. 03/28/15   Meredeth Ide, MD  QUEtiapine (SEROQUEL) 100 MG tablet Take 1 tablet (100 mg total) by mouth 2 (two) times daily. 03/28/15   Meredeth Ide, MD  zolpidem (AMBIEN) 10 MG tablet Take 10 mg by mouth at  bedtime as needed for sleep.    Historical Provider, MD   Allergies  Allergen Reactions  . Robitussin Dm [Dextromethorphan-Guaifenesin] Nausea And Vomiting  . Chocolate Hives  . Nsaids Hives  . Rayon, Purified Hives  . Tramadol Hives    FAMILY HISTORY:  indicated that her mother is deceased. She indicated that her father is deceased. She indicated that her other is deceased.  SOCIAL HISTORY:  reports that she quit smoking about 10 months ago. Her smoking use included Cigarettes. She has a 34 pack-year smoking history. She has never used smokeless tobacco. She reports that she drinks alcohol. She reports that she does not use illicit drugs.  REVIEW OF SYSTEMS:  Performed and was negative other then the pertinent positive and negative results in HPI.  SUBJECTIVE:   VITAL SIGNS: Temp:  [98.1 F (36.7 C)-99.1 F (37.3 C)] 99.1 F (37.3 C) (09/10 1210) Pulse Rate:  [85-121] 98 (09/10 1100) Resp:  [17-37] 17 (09/10 1100) BP: (101-129)/(43-68) 106/50 mmHg (09/10 1100) SpO2:  [96 %-100 %] 98 % (09/10 1100) Weight:  [180 lb 12.4 oz (82 kg)-184 lb 11.9 oz (83.8 kg)] 180 lb 12.4 oz (82 kg) (09/10 0452) HEMODYNAMICS:   VENTILATOR SETTINGS:   INTAKE / OUTPUT:  Intake/Output Summary (Last 24 hours) at 04/25/15 1234 Last data filed at 04/25/15 0700  Gross per 24 hour  Intake     50 ml  Output    800 ml  Net   -750 ml    PHYSICAL EXAMINATION: General: No resp distress Neuro:  No focal deficits HEENT:  PERRLA, EOMI Cardiovascular:  RRR, No MJRG Lungs:  Exp wheeze. More in the upper chest. Harsh breath sounds over the neck. No stridor. Abdomen:  Soft, tender in suprapubic area.  Normal bowel sounds  LABS:  CBC  Recent Labs Lab 04/24/15 2233 04/25/15 0635  WBC 16.4* 17.8*  HGB 11.1* 9.9*  HCT 35.0* 31.4*  PLT 417* 337   Coag's No results for input(s): APTT, INR in the last 168 hours. BMET  Recent Labs Lab 04/24/15 2233 04/25/15 0635  NA 136 137  K 3.7 4.4  CL  105 109  CO2 17* 18*  BUN 10 8  CREATININE 0.90 0.80  GLUCOSE 311* 190*   Electrolytes  Recent Labs Lab 04/24/15 2233 04/25/15 0635 04/25/15 1020  CALCIUM 9.6 9.2  --   MG 2.2 2.6* 2.5*  PHOS 2.8  --   --    Sepsis Markers No results for input(s): LATICACIDVEN, PROCALCITON, O2SATVEN in the last 168 hours. ABG No results for input(s): PHART, PCO2ART, PO2ART in the last 168 hours. Liver Enzymes No results for input(s): AST, ALT, ALKPHOS, BILITOT, ALBUMIN in the last 168 hours. Cardiac Enzymes No results for input(s): TROPONINI, PROBNP in the last 168 hours. Glucose No  results for input(s): GLUCAP in the last 168 hours.  Imaging Dg Chest 2 View  04/24/2015   CLINICAL DATA:  Cough and wheezing.  Painful respirations  EXAM: CHEST  2 VIEW  COMPARISON:  03/25/2015  FINDINGS: The heart size and mediastinal contours are within normal limits. Both lungs are clear. The visualized skeletal structures are unremarkable.  IMPRESSION: No active cardiopulmonary disease.   Electronically Signed   By: Signa Kell M.D.   On: 04/24/2015 18:23   Dg Chest Portable 1 View  04/24/2015   CLINICAL DATA:  Labored breathing with audible wheezes and stridor.  EXAM: PORTABLE CHEST - 1 VIEW  COMPARISON:  04/24/2015  FINDINGS: Shallow inspiration. Normal heart size and pulmonary vascularity. There is increasing interstitial infiltration in the right lung base suggesting early pneumonia. No blunting of costophrenic angles. No pneumothorax. Mediastinal contours appear intact.  IMPRESSION: Developing interstitial infiltration in the right lung base suggesting pneumonia.   Electronically Signed   By: Burman Nieves M.D.   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  PULMONARY A: Etiology as above. P:    - duonebs  Q6hours  - methylprednisolone IV 60mg  Q 6 hours. Can taper starting tomorrow.  - continue home medications  - Lorazepam and IV magnesium gtt for VCD, will try dexmetatomidine if needed for anxiolysis.  - CAP tx below  - Check virus panel  - check urine legionella  INFECTIOUS A:  CAP on CXR likely trigger for VCD/Asthma. P:   BCx2 04/25/2015 UC 04/25/2015 Sputum: ordered Abx: CTX and levofloxacin, start date 04/25/2015, day 1/5   CIWA protocol if e/o EtOH withdrawal: patient drinks 1 bottle of wine 2-3x per week, denies previous withdrawal, no current symtpoms other than anxiety.  Total critical care time: 30 min  Critical care time was exclusive of separately billable procedures and treating other patients.  Critical care was necessary to treat or prevent imminent or life-threatening deterioration.  Critical care was time spent personally by me on the following activities: development of treatment plan with patient and/or surrogate as well as nursing, discussions with consultants, evaluation of patient's response to treatment, examination of patient, obtaining history from patient or surrogate, ordering and performing treatments and interventions, ordering and review of laboratory studies, ordering and review of radiographic studies, pulse oximetry and re-evaluation of patient's condition.  [name] MD [place] [facility] Pager [phone] If no answer or after 3pm call: [phone] 04/25/2015, 12:34 PM

## 2015-04-25 NOTE — ED Notes (Signed)
[facility] at bedside 

## 2015-04-25 NOTE — ED Notes (Signed)
Pt resting quietly. VSS.

## 2015-04-25 NOTE — Progress Notes (Signed)
[facility] Physician-Brief Progress Note Patient Name: Julie Kerr DOB: 1976/06/16 MRN: [id number]   Date of Service  04/25/2015  HPI/Events of Note  Assessed new admit from E.D. No wheezing. Speaking in complete sentences. Says chest feels "tight" and that she will probably have a flare in "30 minutes". No insight into relaxation techniques or pathophysiology behind her VCD.  eICU Interventions  Monitor magnesium. Consider Precedex gtt versus further magnesium IV. Appears stable at the moment.     Intervention Category Evaluation Type: New Patient Evaluation  Lawanda Cousins 04/25/2015, 2:47 AM

## 2015-04-26 DIAGNOSIS — F32A Depression, unspecified: Secondary | ICD-10-CM | POA: Diagnosis present

## 2015-04-26 DIAGNOSIS — J383 Other diseases of vocal cords: Principal | ICD-10-CM

## 2015-04-26 DIAGNOSIS — J189 Pneumonia, unspecified organism: Secondary | ICD-10-CM | POA: Diagnosis present

## 2015-04-26 DIAGNOSIS — R739 Hyperglycemia, unspecified: Secondary | ICD-10-CM

## 2015-04-26 DIAGNOSIS — F1010 Alcohol abuse, uncomplicated: Secondary | ICD-10-CM | POA: Diagnosis present

## 2015-04-26 DIAGNOSIS — R0600 Dyspnea, unspecified: Secondary | ICD-10-CM

## 2015-04-26 DIAGNOSIS — F329 Major depressive disorder, single episode, unspecified: Secondary | ICD-10-CM | POA: Diagnosis present

## 2015-04-26 LAB — CBC
HCT: 30.8 % — ABNORMAL LOW (ref 36.0–46.0)
Hemoglobin: 9.6 g/dL — ABNORMAL LOW (ref 12.0–15.0)
MCH: 24.9 pg — ABNORMAL LOW (ref 26.0–34.0)
MCHC: 31.2 g/dL (ref 30.0–36.0)
MCV: 79.8 fL (ref 78.0–100.0)
Platelets: 357 10*3/uL (ref 150–400)
RBC: 3.86 MIL/uL — ABNORMAL LOW (ref 3.87–5.11)
RDW: 15.7 % — ABNORMAL HIGH (ref 11.5–15.5)
WBC: 27 10*3/uL — ABNORMAL HIGH (ref 4.0–10.5)

## 2015-04-26 LAB — BASIC METABOLIC PANEL
Anion gap: 8 (ref 5–15)
BUN: 10 mg/dL (ref 6–20)
CO2: 22 mmol/L (ref 22–32)
Calcium: 9.4 mg/dL (ref 8.9–10.3)
Chloride: 107 mmol/L (ref 101–111)
Creatinine, Ser: 0.84 mg/dL (ref 0.44–1.00)
GFR calc Af Amer: >60 mL/min (ref >60)
Glucose, Bld: 162 mg/dL — ABNORMAL HIGH (ref 65–99)
POTASSIUM: 4.4 mmol/L (ref 3.5–5.1)
SODIUM: 137 mmol/L (ref 135–145)

## 2015-04-26 LAB — GLUCOSE, CAPILLARY
GLUCOSE-CAPILLARY: 316 mg/dL — AB (ref 65–99)
Glucose-Capillary: 309 mg/dL — ABNORMAL HIGH (ref 65–99)
Glucose-Capillary: 210 mg/dL — ABNORMAL HIGH (ref 65–99)
Glucose-Capillary: 247 mg/dL — ABNORMAL HIGH (ref 65–99)
Glucose-Capillary: 205 mg/dL — ABNORMAL HIGH (ref 65–99)
Glucose-Capillary: 204 mg/dL — ABNORMAL HIGH (ref 65–99)

## 2015-04-26 LAB — MAGNESIUM
MAGNESIUM: 2.1 mg/dL (ref 1.7–2.4)
MAGNESIUM: 1.9 mg/dL (ref 1.7–2.4)
MAGNESIUM: 1.8 mg/dL (ref 1.7–2.4)
Magnesium: 1.9 mg/dL (ref 1.7–2.4)
Magnesium: 1.7 mg/dL (ref 1.7–2.4)
Magnesium: 1.7 mg/dL (ref 1.7–2.4)

## 2015-04-26 LAB — RAPID URINE DRUG SCREEN, HOSP PERFORMED
AMPHETAMINES: NOT DETECTED
BARBITURATES: NOT DETECTED
BENZODIAZEPINES: POSITIVE — AB
COCAINE: NOT DETECTED
Opiates: POSITIVE — AB
TETRAHYDROCANNABINOL: NOT DETECTED

## 2015-04-26 LAB — STREP PNEUMONIAE URINARY ANTIGEN: Strep Pneumo Urinary Antigen: NEGATIVE

## 2015-04-26 MED ORDER — THIAMINE HCL 100 MG/ML IJ SOLN
100.0000 mg | Freq: Every day | INTRAMUSCULAR | Status: DC
  Administered 2015-04-26 – 2015-04-27 (×2): 100 mg via INTRAVENOUS
  Filled 2015-04-26: qty 1
  Filled 2015-04-26: qty 2

## 2015-04-26 MED ORDER — IPRATROPIUM-ALBUTEROL 0.5-2.5 (3) MG/3ML IN SOLN
3.0000 mL | Freq: Three times a day (TID) | RESPIRATORY_TRACT | Status: DC
  Administered 2015-04-26 – 2015-04-27 (×4): 3 mL via RESPIRATORY_TRACT
  Filled 2015-04-26 (×4): qty 3

## 2015-04-26 MED ORDER — FLUOXETINE HCL 20 MG PO CAPS
20.0000 mg | ORAL_CAPSULE | Freq: Every day | ORAL | Status: DC
  Administered 2015-04-26 – 2015-04-29 (×4): 20 mg via ORAL
  Filled 2015-04-26 (×4): qty 1

## 2015-04-26 MED ORDER — INSULIN GLARGINE 100 UNIT/ML ~~LOC~~ SOLN
8.0000 [IU] | Freq: Every day | SUBCUTANEOUS | Status: DC
  Administered 2015-04-26 – 2015-04-28 (×3): 8 [IU] via SUBCUTANEOUS
  Filled 2015-04-26 (×5): qty 0.08

## 2015-04-26 MED ORDER — FOLIC ACID 5 MG/ML IJ SOLN
1.0000 mg | Freq: Every day | INTRAMUSCULAR | Status: DC
  Administered 2015-04-26 – 2015-04-27 (×2): 1 mg via INTRAVENOUS
  Filled 2015-04-26 (×4): qty 0.2

## 2015-04-26 MED ORDER — SODIUM CHLORIDE 3 % IN NEBU
15.0000 mL | INHALATION_SOLUTION | Freq: Once | RESPIRATORY_TRACT | Status: AC
  Administered 2015-04-26: 15 mL via RESPIRATORY_TRACT
  Filled 2015-04-26: qty 15

## 2015-04-26 MED ORDER — LORAZEPAM 2 MG/ML IJ SOLN: 2.0000 mg | INTRAMUSCULAR | Status: DC | PRN

## 2015-04-26 MED ORDER — METHYLPREDNISOLONE SODIUM SUCC 125 MG IJ SOLR
60.0000 mg | Freq: Two times a day (BID) | INTRAMUSCULAR | Status: DC
  Administered 2015-04-26 – 2015-04-27 (×2): 60 mg via INTRAVENOUS
  Filled 2015-04-26: qty 2

## 2015-04-26 NOTE — Progress Notes (Signed)
[place] TEAM 1 - Stepdown/ICU TEAM Progress Note  Julie Kerr ZOX:[id number] DOB: Dec 18, 1975 DOA: 04/24/2015 PCP: No primary care provider on file.  Admit HPI / Brief Narrative: 39 BF PMHx  Anxiety, Asthma, COPD, bronchitis, HTN, Heart murmur, Diabetes Type 2 , S/P Spinal Fusion and Tracheostomy, Alcoholic abuse,VCD   Presented to [facility] ED today after 3 days of progressive wheezing and SOB not responsive to her usually inhalers and nebulized rescue medication. In the ED at [facility] she was given solumedrol, duonebs and was discharged home. She then returned to the [facility] ED with symptoms that were persistent if not worse. She denies any recent sick contacts and/or fevers, chills. She does c/o of some disurea and cough that has been present for the same 3 days. She states her home is full of mold and this is a frequent trigger for her asthma.  HPI/Subjective: 9/11 A/O 4, complains of numerous psychiatric issues including anxiety depression. States has had back surgery 2 and therefore has chronic pain exacerbated by her asthma attacks. States has not had a PCP, has never seen a psychiatrist or psychologist. States already has home care nurse that comes in 2 hours per day. States has never been treated for VCD  Assessment/Plan: CAP on CXR likely trigger for VCD/Asthma. - duonebs TID - methylprednisolone IV  BID: Continue to taper -Officially consult speech in the [place] for behavioral/speech/voice [phone], spoke with them today of course on weekends would not be able to accommodate patient but services are available. -Respiratory virus panel, strep pneumo/Legionella urine antigen pending  -Continue current antibiotics for complete 7 day course.  -Repeat PCXR in a.m.  Hyperglycemia -Iatrogenic -Continue resistant SSI, start Lantus 8 units daily -Change diet to carb modified  Alcohol abuse -patient drinks 1 bottle of wine 2-3x per week, -CIWA protocol    Depression/Anxiety -Start Prozac 20 mg daily -CIWA protocol will also cover anxiety; DC Xanax  Schizophrenia? -Continue Seroquel 100 mg BID -Psych consulted for diagnosis, treatment plan, long-term outpatient plan.  Chronic pain syndrome -Secondary to back surgery 2   Code Status: FULL Family Communication: no family present at time of exam Disposition Plan: Per Behavioral Health; IVC?    Consultants:   Procedure/Significant Events: 9/9 PCXR;Developing interstitial infiltration in the right lung base suggesting pneumonia   Culture 9/10 MRSA by PCR negative 9/10 blood pending 9/10 respiratory virus panel pending 9/11 strep pneumo urine antigen pending  9/11 legionella urine antigen pending 9/11 sputum pending  Antibiotics: Levofloxacin 9/10>>  DVT prophylaxis: Lovenox   Devices    LINES / TUBES:      Continuous Infusions:   Objective: VITAL SIGNS: Temp: 98.9 F (37.2 C) (09/11 1641) Temp Source: Oral (09/11 1641) BP: 108/58 mmHg (09/11 1200) Pulse Rate: 89 (09/11 1200) SPO2; FIO2:   Intake/Output Summary (Last 24 hours) at 04/26/15 2016 Last data filed at 04/26/15 1800  Gross per 24 hour  Intake   1370 ml  Output    500 ml  Net    870 ml     Exam: General:  A/O 4, No acute respiratory distress(SPO2 100% on room air) sees  Eyes: Negative headache, eye pain, double vision,negative scleral hemorrhage ENT: Negative Runny nose, negative ear pain, negative gingival bleeding, Neck:  Negative scars, masses, torticollis, lymphadenopathy, JVD Lungs:  diffuse inspiratory and expiratory wheezing large component is upper airway, negative rhonchi or  crackles Cardiovascular: Regular rate and rhythm without murmur gallop or rub normal S1 and S2; NOTE secondary to depression/anxiety as  soon as she began to talk about her life became tachycardic/tachypneic  Abdomen:negative abdominal pain, nondistended, positive soft, bowel sounds, no rebound, no  ascites, no appreciable mass Extremities: No significant cyanosis, clubbing, or edema bilateral lower extremities Psychiatric: Positive  depression,positive  anxiety, negative fatigue, negative mania , patient EXTREMELY DRAMATIC difficult to tease out true physical signs and symptoms from psychiatric issues.  Neurologic:  Cranial nerves II through XII intact, tongue/uvula midline, all extremities muscle strength 5/5, sensation intact throughout,  negative dysarthria, negative expressive aphasia, negative receptive aphasia.   Data Reviewed: Basic Metabolic Panel:  Recent Labs Lab 04/24/15 2233 04/25/15 0635  04/26/15 0100 04/26/15 0715 04/26/15 1120 04/26/15 1500 04/26/15 1820  NA 136 137  --   --  137  --   --   --   K 3.7 4.4  --   --  4.4  --   --   --   CL 105 109  --   --  107  --   --   --   CO2 17* 18*  --   --  22  --   --   --   GLUCOSE 311* 190*  --   --  162*  --   --   --   BUN 10 8  --   --  10  --   --   --   CREATININE 0.90 0.80  --   --  0.84  --   --   --   CALCIUM 9.6 9.2  --   --  9.4  --   --   --   MG 2.2 2.6*  < > 2.1 1.9 1.9 1.8 1.7  PHOS 2.8  --   --   --   --   --   --   --   < > = values in this interval not displayed. Liver Function Tests: No results for input(s): AST, ALT, ALKPHOS, BILITOT, PROT, ALBUMIN in the last 168 hours. No results for input(s): LIPASE, AMYLASE in the last 168 hours. No results for input(s): AMMONIA in the last 168 hours. CBC:  Recent Labs Lab 04/24/15 2233 04/25/15 0635 04/26/15 0715  WBC 16.4* 17.8* 27.0*  HGB 11.1* 9.9* 9.6*  HCT 35.0* 31.4* 30.8*  MCV 80.3 78.3 79.8  PLT 417* 337 357   Cardiac Enzymes: No results for input(s): CKTOTAL, CKMB, CKMBINDEX, TROPONINI in the last 168 hours. BNP (last 3 results) No results for input(s): BNP in the last 8760 hours.  ProBNP (last 3 results)  Recent Labs  05/06/14 2328 07/25/14 1718 07/25/14 1844  PROBNP <5.0 <5.0 <5.0    CBG:  Recent Labs Lab 04/25/15 2339  04/26/15 0353 04/26/15 0831 04/26/15 1209 04/26/15 1639  GLUCAP 316* 309* 210* 247* 205*    Recent Results (from the past 240 hour(s))  MRSA PCR Screening     Status: None   Collection Time: 04/25/15  2:48 AM  Result Value Ref Range Status   MRSA by PCR NEGATIVE NEGATIVE Final    Comment:        The GeneXpert MRSA Assay (FDA approved for NASAL specimens only), is one component of a comprehensive MRSA colonization surveillance program. It is not intended to diagnose MRSA infection nor to guide or monitor treatment for MRSA infections.   Culture, blood (routine x 2)     Status: None (Preliminary result)   Collection Time: 04/25/15  4:20 AM  Result Value Ref Range Status   Specimen Description BLOOD  LEFT HAND  Final   Special Requests BOTTLES DRAWN AEROBIC ONLY 10CC  Final   Culture NO GROWTH 1 DAY  Final   Report Status PENDING  Incomplete  Culture, blood (routine x 2)     Status: None (Preliminary result)   Collection Time: 04/25/15  4:35 AM  Result Value Ref Range Status   Specimen Description BLOOD RIGHT ANTECUBITAL  Final   Special Requests BOTTLES DRAWN AEROBIC AND ANAEROBIC 10CC  Final   Culture NO GROWTH 1 DAY  Final   Report Status PENDING  Incomplete     Studies:  Recent x-ray studies have been reviewed in detail by the Attending Physician  Scheduled Meds:  Scheduled Meds: . arformoterol  15 mcg Nebulization BID  . azelastine  2 spray Each Nare BID  . budesonide  0.5 mg Nebulization BID  . enoxaparin (LOVENOX) injection  40 mg Subcutaneous Q24H  . FLUoxetine  20 mg Oral Daily  . fluticasone  2 spray Each Nare BID  . folic acid  1 mg Intravenous Daily  . insulin aspart  0-20 Units Subcutaneous 6 times per day  . insulin glargine  8 Units Subcutaneous Daily  . ipratropium-albuterol  3 mL Nebulization TID  . levofloxacin (LEVAQUIN) IV  750 mg Intravenous Q24H  . methylPREDNISolone (SOLU-MEDROL) injection  60 mg Intravenous Q12H  . morphine  30 mg Oral  Q12H  . pantoprazole  40 mg Oral Daily  . pregabalin  25 mg Oral BID  . QUEtiapine  100 mg Oral BID  . sodium chloride HYPERTONIC  15 mL Nebulization Once  . thiamine  100 mg Intravenous Daily    Time spent on care of this patient: 40 mins   Julie Kerr, Roselind Messier , MD  [facility] Office  [phone] Pager [phone]  On-Call/Text Page:      [email or web address]      password [email or web address]  If 7PM-7AM, please contact night-coverage [email or web address] Password [email or web address] 04/26/2015, 8:16 PM   LOS: 1 day   Care during the described time interval was provided by me .  I have reviewed this patient's available data, including medical history, events of note, physical examination, and all test results as part of my evaluation. I have personally reviewed and interpreted all radiology studies.   Carolyne Littles, MD [phone] Pager

## 2015-04-26 NOTE — Progress Notes (Signed)
Patient was given 15 mL hypertonic saline neb and Duoneb after. Along with other nebulizer medications following.  Pt was unable to cough up any sputum. Has dry non-productive cough. Patient is clear and slightly diminished in the bases. When listening to BBS patient forces upper airway wheeze which makes her cough. RN at bedside. RT told patient if able to cough up anything to spit into specimen container and notify RN.

## 2015-04-27 ENCOUNTER — Inpatient Hospital Stay (HOSPITAL_COMMUNITY): Payer: Medicare Other

## 2015-04-27 DIAGNOSIS — F1994 Other psychoactive substance use, unspecified with psychoactive substance-induced mood disorder: Secondary | ICD-10-CM

## 2015-04-27 LAB — COMPREHENSIVE METABOLIC PANEL
ALT: 27 U/L (ref 14–54)
AST: 33 U/L (ref 15–41)
Albumin: 3.2 g/dL — ABNORMAL LOW (ref 3.5–5.0)
Alkaline Phosphatase: 56 U/L (ref 38–126)
Anion gap: 7 (ref 5–15)
BUN: 12 mg/dL (ref 6–20)
CHLORIDE: 105 mmol/L (ref 101–111)
CO2: 26 mmol/L (ref 22–32)
CREATININE: 0.79 mg/dL (ref 0.44–1.00)
Calcium: 9.3 mg/dL (ref 8.9–10.3)
GFR calc Af Amer: >60 mL/min (ref >60)
Glucose, Bld: 153 mg/dL — ABNORMAL HIGH (ref 65–99)
Potassium: 4.3 mmol/L (ref 3.5–5.1)
Sodium: 138 mmol/L (ref 135–145)
TOTAL PROTEIN: 6.3 g/dL — AB (ref 6.5–8.1)

## 2015-04-27 LAB — CBC WITH DIFFERENTIAL/PLATELET
BASOS ABS: 0 10*3/uL (ref 0.0–0.1)
Basophils Relative: 0 % (ref 0–1)
Eosinophils Absolute: 0 10*3/uL (ref 0.0–0.7)
Eosinophils Relative: 0 % (ref 0–5)
HEMATOCRIT: 31.9 % — AB (ref 36.0–46.0)
Hemoglobin: 9.9 g/dL — ABNORMAL LOW (ref 12.0–15.0)
LYMPHS PCT: 5 % — AB (ref 12–46)
Lymphs Abs: 1.1 10*3/uL (ref 0.7–4.0)
MCH: 24.9 pg — ABNORMAL LOW (ref 26.0–34.0)
MCHC: 31 g/dL (ref 30.0–36.0)
MCV: 80.4 fL (ref 78.0–100.0)
Monocytes Absolute: 0.9 10*3/uL (ref 0.1–1.0)
Monocytes Relative: 4 % (ref 3–12)
NEUTROS ABS: 19.3 10*3/uL — AB (ref 1.7–7.7)
Neutrophils Relative %: 91 % — ABNORMAL HIGH (ref 43–77)
Platelets: 346 10*3/uL (ref 150–400)
RBC: 3.97 MIL/uL (ref 3.87–5.11)
RDW: 15.6 % — ABNORMAL HIGH (ref 11.5–15.5)
WBC: 21.3 10*3/uL — AB (ref 4.0–10.5)

## 2015-04-27 LAB — MAGNESIUM
MAGNESIUM: 1.8 mg/dL (ref 1.7–2.4)
Magnesium: 1.7 mg/dL (ref 1.7–2.4)
Magnesium: 1.7 mg/dL (ref 1.7–2.4)
Magnesium: 1.7 mg/dL (ref 1.7–2.4)
Magnesium: 1.8 mg/dL (ref 1.7–2.4)
Magnesium: 1.8 mg/dL (ref 1.7–2.4)

## 2015-04-27 LAB — GLUCOSE, CAPILLARY
GLUCOSE-CAPILLARY: 199 mg/dL — AB (ref 65–99)
GLUCOSE-CAPILLARY: 220 mg/dL — AB (ref 65–99)
GLUCOSE-CAPILLARY: 257 mg/dL — AB (ref 65–99)
Glucose-Capillary: 168 mg/dL — ABNORMAL HIGH (ref 65–99)
Glucose-Capillary: 179 mg/dL — ABNORMAL HIGH (ref 65–99)
Glucose-Capillary: 129 mg/dL — ABNORMAL HIGH (ref 65–99)

## 2015-04-27 LAB — BLOOD GAS, ARTERIAL
Acid-Base Excess: 0.3 mmol/L (ref 0.0–2.0)
BICARBONATE: 24.6 meq/L — AB (ref 20.0–24.0)
DRAWN BY: 365271
FIO2: 0.21
O2 Saturation: 95.5 %
PATIENT TEMPERATURE: 97.8
PH ART: 7.401 (ref 7.350–7.450)
TCO2: 25.8 mmol/L (ref 0–100)
pCO2 arterial: 40.2 mmHg (ref 35.0–45.0)
pO2, Arterial: 74.5 mmHg — ABNORMAL LOW (ref 80.0–100.0)

## 2015-04-27 LAB — LEGIONELLA ANTIGEN, URINE

## 2015-04-27 MED ORDER — MORPHINE SULFATE (PF) 2 MG/ML IV SOLN
1.0000 mg | INTRAVENOUS | Status: DC | PRN
  Administered 2015-04-27 – 2015-04-30 (×9): 2 mg via INTRAVENOUS
  Filled 2015-04-27 (×9): qty 1

## 2015-04-27 MED ORDER — METHYLPREDNISOLONE SODIUM SUCC 40 MG IJ SOLR
40.0000 mg | Freq: Three times a day (TID) | INTRAMUSCULAR | Status: DC
  Administered 2015-04-27 – 2015-04-29 (×6): 40 mg via INTRAVENOUS
  Filled 2015-04-27 (×6): qty 1

## 2015-04-27 MED ORDER — VITAMIN B-1 100 MG PO TABS
100.0000 mg | ORAL_TABLET | Freq: Every day | ORAL | Status: DC
  Administered 2015-04-28 – 2015-04-30 (×3): 100 mg via ORAL
  Filled 2015-04-27 (×3): qty 1

## 2015-04-27 MED ORDER — IPRATROPIUM-ALBUTEROL 0.5-2.5 (3) MG/3ML IN SOLN
3.0000 mL | Freq: Four times a day (QID) | RESPIRATORY_TRACT | Status: DC
  Administered 2015-04-27 – 2015-04-28 (×3): 3 mL via RESPIRATORY_TRACT
  Filled 2015-04-27 (×4): qty 3

## 2015-04-27 MED ORDER — FOLIC ACID 1 MG PO TABS
1.0000 mg | ORAL_TABLET | Freq: Every day | ORAL | Status: DC
  Administered 2015-04-28 – 2015-04-30 (×3): 1 mg via ORAL
  Filled 2015-04-27 (×3): qty 1

## 2015-04-27 NOTE — Care Management Note (Signed)
Case Management Note  Patient Details  Name: Julie Kerr MRN: [id number] Date of Birth: 07-10-1976  Subjective/Objective:                 Spoke with patient, she is from home lives with her children states she is the only adult in the home, has home oxygen through [facility], would prefer them for any HH needs, uses [facility] home care for [facility]. Patient states that she has a walker, and a nebulizer as well. She believes her home has mold and it is contributing to her respiratory problems as well as her 85 year old son's asthma. Requested housing information, SW consult active in [employer]. Patient would to FU with [facility].    Action/Plan:  Will schedule appointment with [facility] prior to discharge.   Expected Discharge Date:                  Expected Discharge Plan:  Home/Self Care  In-House Referral:     Discharge planning Services  CM Consult, Indigent Health Clinic  Post Acute Care Choice:    Choice offered to:     DME Arranged:    DME Agency:     HH Arranged:    HH Agency:     Status of Service:  In process, will continue to follow  Medicare Important Message Given:  Yes-second notification given Date Medicare IM Given:    Medicare IM give by:    Date Additional Medicare IM Given:    Additional Medicare Important Message give by:     If discussed at Long Length of Stay Meetings, dates discussed:    Additional Comments:  Lawerance Sabal, RN 04/27/2015, 10:16 AM

## 2015-04-27 NOTE — Care Management Important Message (Signed)
Important Message  Patient Details  Name: Julie Kerr MRN: [id number] Date of Birth: January 28, 1976   Medicare Important Message Given:  Yes-second notification given    Lawerance Sabal, RN 04/27/2015, 9:42 AMImportant Message  Patient Details  Name: Julie Kerr MRN: [id number] Date of Birth: 1975/11/17   Medicare Important Message Given:  Yes-second notification given    Lawerance Sabal, RN 04/27/2015, 9:42 AM

## 2015-04-27 NOTE — Consult Note (Signed)
[place] Consult   Reason for Consult:  Substance abuse and depression with anxiety Referring Physician: Dr. Karleen Hampshire Patient Identification: Julie Kerr MRN:  [id number] Principal Diagnosis: Generalized anxiety disorder Diagnosis:   Patient Active Problem List   Diagnosis Date Noted  . CAP (community acquired pneumonia) [J18.9]   . Alcohol abuse [F10.10]   . Depression [F32.9]   . Asthma exacerbation [J45.901] 03/24/2015  . Anxiety [F41.9]   . Respiratory distress [R06.00]   . Acute on chronic respiratory failure with hypoxemia [J96.21] 11/25/2014  . Chronic pain [G89.29]   . Rectal bleeding [K62.5] 10/08/2014  . Blood in stool [K92.1]   . Urinary tract infectious disease [N39.0]   . Acute respiratory failure [J96.00] 08/29/2014  . Adjustment disorder with anxious mood [F43.22]   . 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 Acute on chronic respiratory failure with hypoxia [J96.21]   . Chronic asthmatic bronchitis [J44.9] 08/21/2014  . Tracheostomy status [Z93.0] 08/05/2014  . Cough [R05]   . Polysubstance abuse [F19.10] 07/28/2014  . Metabolic acidosis [K27.0] 07/28/2014  . Encounter for orogastric (OG) tube placement [Z46.59]   . Hyperglycemia [R73.9] 05/07/2014  . Shortness of breath [R06.02] 05/07/2014  . Generalized anxiety disorder [F41.1] 01/20/2014  . Panic attacks [F41.0] 01/20/2014  . Upper airway cough syndrome, severe, with clinical VCD [R05] 12/07/2013  . Stridor [R06.1] 10/29/2013  . SOB (shortness of breath) [R06.02] 10/29/2013  . Dyspnea [R06.00] 09/06/2013  . Vocal cord dysfunction [J38.3] 09/06/2013  . Sepsis [A41.9] 08/04/2013  . Anemia [D64.9] 08/04/2013  . Tobacco use disorder [Z72.0] 06/17/2013  . Chronic pain syndrome [G89.4] 06/17/2013  . Acute respiratory failure with hypoxia [J96.01] 06/14/2013    Total Time spent with patient: 1 hour  Subjective:   Julie Kerr is a 39 y.o. female patient admitted with  depression, anxiety and substance abuse.  HPI:  Julie Kerr is a 39 years old female admitted to [facility] with increased symptoms of depression anxiety. Patient complains about feeling sad, depressed, worthless, lack of interest and motivation unable to care for herself and her son. Patient is also suffering with the vocal cord dysfunction so she's been talking with low twice. Patient has been taking pain medications for her back pain and Xanax 2 mg 3 times a day from primary care physician and has been taking tapered dose so that she won't ran out of the medication. Patient is also reported she has no local psychiatrist or counselor's. Patient endorses history of multiple psychiatric hospitalizations and recently relocated to [facility] about a few months ago and has a 29 years old son living with her. Patient does not want to stay with the previous place because of polysubstance abuse which she cannot get it off in that environment. Patient denies current abuse or intoxication. Patient urine drug screen positive for opiates and benzodiazepine's. Patient has history of cocaine but currently negative for urine drug screen. Patient has no known alcohol abuse or dependence. Patient reported feeling depressed, tearful and has no outpatient medication management. Patient is willing to start medication and follow-up with outpatient medication management and medically stable patient denies current symptoms of suicidal ideation, homicidal ideation, intention or plans. Patient has no evidence of psychosis.  HPI Elements:  Location:  Depression, anxiety and substance abuse. Quality:  Fair to poor. Severity:  Moderate to severe. Timing:  Recent relocation. Duration:  Few months. Context:  Psychosocial stressors, unemployment, substance abuse and no known support system.  Past Medical History:  Past Medical History  Diagnosis Date  . Asthma   . Anxiety   . COPD (chronic obstructive pulmonary  disease)   . Bronchitis   . Heart murmur   . Shortness of breath   . Diabetes mellitus without complication   . Hypertension     Past Surgical History  Procedure Laterality Date  . Spinal fusion    . Tracheostomy  December 2015   Family History:  Family History  Problem Relation Age of Onset  . HIV Mother   . Heart disease Father   . CVA Father   . Heart disease Other   . Emphysema Maternal Grandmother     smoked  . Asthma Sister   . Clotting disorder Sister   . Clotting disorder Maternal Grandmother   . Lung cancer Maternal Grandmother     smoked   Social History:  History  Alcohol Use  . 0.0 oz/week  . 0 Standard drinks or equivalent per week    Comment: occasional     History  Drug Use No    Social History   Social History  . Marital Status: Single    Spouse Name: N/A  . Number of Children: N/A  . Years of Education: N/A   Social History Main Topics  . Smoking status: Former Smoker -- 2.00 packs/day for 17 years    Types: Cigarettes    Quit date: 06/15/2014  . Smokeless tobacco: Never Used  . Alcohol Use: 0.0 oz/week    0 Standard drinks or equivalent per week     Comment: occasional  . Drug Use: No  . Sexual Activity: Yes    Birth Control/ Protection: Other-see comments     Comment: female partners only   Other Topics Concern  . None   Social History Narrative   Additional Social History:                          Allergies:   Allergies  Allergen Reactions  . Robitussin Dm [Dextromethorphan-Guaifenesin] Nausea And Vomiting  . Chocolate Hives  . Nsaids Hives  . Rayon, Purified Hives  . Tramadol Hives    Labs:  Results for orders placed or performed during the hospital encounter of 04/24/15 (from the past 48 hour(s))  Magnesium     Status: None   Collection Time: 04/25/15  2:10 PM  Result Value Ref Range   Magnesium 2.3 1.7 - 2.4 mg/dL  Magnesium     Status: None   Collection Time: 04/25/15  7:22 PM  Result Value Ref Range    Magnesium 2.2 1.7 - 2.4 mg/dL  Glucose, capillary     Status: Abnormal   Collection Time: 04/25/15  8:13 PM  Result Value Ref Range   Glucose-Capillary 243 (H) 65 - 99 mg/dL   Comment 1 Notify RN   Magnesium     Status: None   Collection Time: 04/25/15  9:26 PM  Result Value Ref Range   Magnesium 2.2 1.7 - 2.4 mg/dL  Glucose, capillary     Status: Abnormal   Collection Time: 04/25/15 11:39 PM  Result Value Ref Range   Glucose-Capillary 316 (H) 65 - 99 mg/dL   Comment 1 Notify RN   Magnesium     Status: None   Collection Time: 04/26/15  1:00 AM  Result Value Ref Range   Magnesium 2.1 1.7 - 2.4 mg/dL  Glucose, capillary     Status: Abnormal   Collection Time: 04/26/15  3:53 AM  Result Value Ref Range   Glucose-Capillary 309 (H) 65 - 99 mg/dL   Comment 1 Notify RN   Basic metabolic panel     Status: Abnormal   Collection Time: 04/26/15  7:15 AM  Result Value Ref Range   Sodium 137 135 - 145 mmol/L   Potassium 4.4 3.5 - 5.1 mmol/L   Chloride 107 101 - 111 mmol/L   CO2 22 22 - 32 mmol/L   Glucose, Bld 162 (H) 65 - 99 mg/dL   BUN 10 6 - 20 mg/dL   Creatinine, Ser 0.84 0.44 - 1.00 mg/dL   Calcium 9.4 8.9 - 10.3 mg/dL   GFR calc non Af Amer >60 >60 mL/min   GFR calc Af Amer >60 >60 mL/min    Comment: (NOTE) The eGFR has been calculated using the CKD EPI equation. This calculation has not been validated in all clinical situations. eGFR's persistently <60 mL/min signify possible Chronic Kidney Disease.    Anion gap 8 5 - 15  CBC     Status: Abnormal   Collection Time: 04/26/15  7:15 AM  Result Value Ref Range   WBC 27.0 (H) 4.0 - 10.5 K/uL   RBC 3.86 (L) 3.87 - 5.11 MIL/uL   Hemoglobin 9.6 (L) 12.0 - 15.0 g/dL   HCT 30.8 (L) 36.0 - 46.0 %   MCV 79.8 78.0 - 100.0 fL   MCH 24.9 (L) 26.0 - 34.0 pg   MCHC 31.2 30.0 - 36.0 g/dL   RDW 15.7 (H) 11.5 - 15.5 %   Platelets 357 150 - 400 K/uL  Magnesium     Status: None   Collection Time: 04/26/15  7:15 AM  Result Value Ref  Range   Magnesium 1.9 1.7 - 2.4 mg/dL  Glucose, capillary     Status: Abnormal   Collection Time: 04/26/15  8:31 AM  Result Value Ref Range   Glucose-Capillary 210 (H) 65 - 99 mg/dL  Magnesium     Status: None   Collection Time: 04/26/15 11:20 AM  Result Value Ref Range   Magnesium 1.9 1.7 - 2.4 mg/dL  Glucose, capillary     Status: Abnormal   Collection Time: 04/26/15 12:09 PM  Result Value Ref Range   Glucose-Capillary 247 (H) 65 - 99 mg/dL  Urine rapid drug screen (hosp performed)     Status: Abnormal   Collection Time: 04/26/15 12:41 PM  Result Value Ref Range   Opiates POSITIVE (A) NONE DETECTED   Cocaine NONE DETECTED NONE DETECTED   Benzodiazepines POSITIVE (A) NONE DETECTED   Amphetamines NONE DETECTED NONE DETECTED   Tetrahydrocannabinol NONE DETECTED NONE DETECTED   Barbiturates NONE DETECTED NONE DETECTED    Comment:        DRUG SCREEN FOR MEDICAL PURPOSES ONLY.  IF CONFIRMATION IS NEEDED FOR ANY PURPOSE, NOTIFY LAB WITHIN 5 DAYS.        LOWEST DETECTABLE LIMITS FOR URINE DRUG SCREEN Drug Class       Cutoff (ng/mL) Amphetamine      1000 Barbiturate      200 Benzodiazepine   627 Tricyclics       035 Opiates          300 Cocaine          300 THC              50   Strep pneumoniae urinary antigen     Status: None   Collection Time: 04/26/15 12:41 PM  Result Value Ref  Range   Strep Pneumo Urinary Antigen NEGATIVE NEGATIVE    Comment:        Infection due to S. pneumoniae cannot be absolutely ruled out since the antigen present may be below the detection limit of the test.   Magnesium     Status: None   Collection Time: 04/26/15  3:00 PM  Result Value Ref Range   Magnesium 1.8 1.7 - 2.4 mg/dL  Glucose, capillary     Status: Abnormal   Collection Time: 04/26/15  4:39 PM  Result Value Ref Range   Glucose-Capillary 205 (H) 65 - 99 mg/dL  Magnesium     Status: None   Collection Time: 04/26/15  6:20 PM  Result Value Ref Range   Magnesium 1.7 1.7 - 2.4  mg/dL  Glucose, capillary     Status: Abnormal   Collection Time: 04/26/15  7:50 PM  Result Value Ref Range   Glucose-Capillary 204 (H) 65 - 99 mg/dL   Comment 1 Notify RN   Magnesium     Status: None   Collection Time: 04/26/15  9:05 PM  Result Value Ref Range   Magnesium 1.7 1.7 - 2.4 mg/dL  Glucose, capillary     Status: Abnormal   Collection Time: 04/27/15 12:20 AM  Result Value Ref Range   Glucose-Capillary 257 (H) 65 - 99 mg/dL   Comment 1 Notify RN    Comment 2 Document in Chart   Magnesium     Status: None   Collection Time: 04/27/15 12:58 AM  Result Value Ref Range   Magnesium 1.7 1.7 - 2.4 mg/dL  Blood gas, arterial     Status: Abnormal   Collection Time: 04/27/15  3:13 AM  Result Value Ref Range   FIO2 0.21    pH, Arterial 7.401 7.350 - 7.450   pCO2 arterial 40.2 35.0 - 45.0 mmHg   pO2, Arterial 74.5 (L) 80.0 - 100.0 mmHg   Bicarbonate 24.6 (H) 20.0 - 24.0 mEq/L   TCO2 25.8 0 - 100 mmol/L   Acid-Base Excess 0.3 0.0 - 2.0 mmol/L   O2 Saturation 95.5 %   Patient temperature 97.8    Collection site LEFT RADIAL    Drawn by [id number]    Sample type ARTERIAL DRAW    Allens test (pass/fail) PASS PASS  Glucose, capillary     Status: Abnormal   Collection Time: 04/27/15  4:07 AM  Result Value Ref Range   Glucose-Capillary 168 (H) 65 - 99 mg/dL   Comment 1 Notify RN    Comment 2 Document in Chart   Magnesium     Status: None   Collection Time: 04/27/15  5:12 AM  Result Value Ref Range   Magnesium 1.7 1.7 - 2.4 mg/dL  CBC with Differential/Platelet     Status: Abnormal   Collection Time: 04/27/15  5:12 AM  Result Value Ref Range   WBC 21.3 (H) 4.0 - 10.5 K/uL   RBC 3.97 3.87 - 5.11 MIL/uL   Hemoglobin 9.9 (L) 12.0 - 15.0 g/dL   HCT 31.9 (L) 36.0 - 46.0 %   MCV 80.4 78.0 - 100.0 fL   MCH 24.9 (L) 26.0 - 34.0 pg   MCHC 31.0 30.0 - 36.0 g/dL   RDW 15.6 (H) 11.5 - 15.5 %   Platelets 346 150 - 400 K/uL   Neutrophils Relative % 91 (H) 43 - 77 %   Neutro Abs 19.3 (H)  1.7 - 7.7 K/uL   Lymphocytes Relative 5 (L) 12 - 46 %  Lymphs Abs 1.1 0.7 - 4.0 K/uL   Monocytes Relative 4 3 - 12 %   Monocytes Absolute 0.9 0.1 - 1.0 K/uL   Eosinophils Relative 0 0 - 5 %   Eosinophils Absolute 0.0 0.0 - 0.7 K/uL   Basophils Relative 0 0 - 1 %   Basophils Absolute 0.0 0.0 - 0.1 K/uL  Comprehensive metabolic panel     Status: Abnormal   Collection Time: 04/27/15  5:12 AM  Result Value Ref Range   Sodium 138 135 - 145 mmol/L   Potassium 4.3 3.5 - 5.1 mmol/L   Chloride 105 101 - 111 mmol/L   CO2 26 22 - 32 mmol/L   Glucose, Bld 153 (H) 65 - 99 mg/dL   BUN 12 6 - 20 mg/dL   Creatinine, Ser 0.79 0.44 - 1.00 mg/dL   Calcium 9.3 8.9 - 10.3 mg/dL   Total Protein 6.3 (L) 6.5 - 8.1 g/dL   Albumin 3.2 (L) 3.5 - 5.0 g/dL   AST 33 15 - 41 U/L   ALT 27 14 - 54 U/L   Alkaline Phosphatase 56 38 - 126 U/L   Total Bilirubin <0.1 (L) 0.3 - 1.2 mg/dL   GFR calc non Af Amer >60 >60 mL/min   GFR calc Af Amer >60 >60 mL/min    Comment: (NOTE) The eGFR has been calculated using the CKD EPI equation. This calculation has not been validated in all clinical situations. eGFR's persistently <60 mL/min signify possible Chronic Kidney Disease.    Anion gap 7 5 - 15  Magnesium     Status: None   Collection Time: 04/27/15  8:54 AM  Result Value Ref Range   Magnesium 1.8 1.7 - 2.4 mg/dL  Glucose, capillary     Status: Abnormal   Collection Time: 04/27/15  9:16 AM  Result Value Ref Range   Glucose-Capillary 179 (H) 65 - 99 mg/dL    Vitals: Blood pressure 113/67, pulse 74, temperature 98.8 F (37.1 C), temperature source Oral, resp. rate 18, height _0  (1.6 m), weight 89.4 kg (197 lb 1.5 oz), last menstrual period 04/16/2015, SpO2 100 %.  Risk to Self: Is patient at risk for suicide?: No Risk to Others:   Prior Inpatient Therapy:   Prior Outpatient Therapy:    Current Facility-Administered Medications  Medication Dose Route Frequency Provider Last Rate Last Dose  . 0.9 %   sodium chloride infusion  250 mL Intravenous PRN Roswell Nickel, MD      . arformoterol [facility]) nebulizer solution 15 mcg  15 mcg Nebulization BID Roswell Nickel, MD   15 mcg at 04/27/15 0850  . azelastine (ASTELIN) 0.1 % nasal spray 2 spray  2 spray Each Nare BID Roswell Nickel, MD   2 spray at 04/27/15 [phone]  . budesonide (PULMICORT) nebulizer solution 0.5 mg  0.5 mg Nebulization BID Roswell Nickel, MD   0.5 mg at 04/27/15 0850  . enoxaparin (LOVENOX) injection 40 mg  40 mg Subcutaneous Q24H Roswell Nickel, MD   40 mg at 04/27/15 0936  . FLUoxetine (PROZAC) capsule 20 mg  20 mg Oral Daily Allie Bossier, MD   20 mg at 04/27/15 [phone]  . fluticasone (FLONASE) 50 MCG/ACT nasal spray 2 spray  2 spray Each Nare BID Roswell Nickel, MD   2 spray at 04/27/15 [phone]  . folic acid injection 1 mg  1 mg Intravenous Daily Allie Bossier, MD   1 mg at 04/26/15 2134  .  insulin aspart (novoLOG) injection 0-20 Units  0-20 Units Subcutaneous 6 times per day Marshell Garfinkel, MD   4 Units at 04/27/15 0935  . insulin glargine (LANTUS) injection 8 Units  8 Units Subcutaneous Daily Allie Bossier, MD   8 Units at 04/27/15 1000  . ipratropium-albuterol (DUONEB) 0.5-2.5 (3) MG/3ML nebulizer solution 3 mL  3 mL Nebulization TID Roswell Nickel, MD   3 mL at 04/27/15 0850  . levofloxacin (LEVAQUIN) IVPB 750 mg  750 mg Intravenous Q24H Roswell Nickel, MD   750 mg at 04/27/15 0522  . LORazepam (ATIVAN) injection 2-3 mg  2-3 mg Intravenous Q1H PRN Allie Bossier, MD      . methylPREDNISolone sodium succinate (SOLU-MEDROL) 125 mg/2 mL injection 60 mg  60 mg Intravenous Q12H Allie Bossier, MD   60 mg at 04/27/15 0936  . morphine (MS CONTIN) 12 hr tablet 30 mg  30 mg Oral Q12H Roswell Nickel, MD   30 mg at 04/27/15 4431  . oxyCODONE (Oxy IR/ROXICODONE) immediate release tablet 15 mg  15 mg Oral Q6H PRN Roswell Nickel, MD   15 mg at 04/27/15 0429  . pantoprazole (PROTONIX) EC tablet 40 mg  40 mg Oral Daily Roswell Nickel,  MD   40 mg at 04/27/15 5400  . pregabalin (LYRICA) capsule 25 mg  25 mg Oral BID Roswell Nickel, MD   25 mg at 04/27/15 8676  . promethazine-codeine (PHENERGAN with CODEINE) 6.25-10 MG/5ML syrup 5 mL  5 mL Oral Q6H PRN Roswell Nickel, MD   5 mL at 04/26/15 1648  . QUEtiapine (SEROQUEL) tablet 100 mg  100 mg Oral BID Roswell Nickel, MD   100 mg at 04/27/15 0936  . thiamine (B-1) injection 100 mg  100 mg Intravenous Daily Allie Bossier, MD   100 mg at 04/27/15 1950  . zolpidem (AMBIEN) tablet 5 mg  5 mg Oral QHS PRN Roswell Nickel, MD   5 mg at 04/25/15 2117    Musculoskeletal: Strength & Muscle Tone: decreased Gait & Station: unable to stand Patient leans: N/A  Psychiatric Specialty Exam: Physical Exam as per history and physical   ROS complaining about low back pain, depression, anxiety, tearful, substance abuse, insomnia but denied chest pain and shortness of breath No Fever-chills, No Headache, No changes with Vision or hearing, reports vertigo No problems swallowing food or Liquids, No Chest pain, Cough or Shortness of Breath, No Abdominal pain, No Nausea or Vommitting, Bowel movements are regular, No Blood in stool or Urine, No dysuria, No new skin rashes or bruises, No new joints pains-aches,  No new weakness, tingling, numbness in any extremity, No recent weight gain or loss, No polyuria, polydypsia or polyphagia,   A full 10 point Review of Systems was done, except as stated above, all other Review of Systems were negative.  Blood pressure 113/67, pulse 74, temperature 98.8 F (37.1 C), temperature source Oral, resp. rate 18, height _0  (1.6 m), weight 89.4 kg (197 lb 1.5 oz), last menstrual period 04/16/2015, SpO2 100 %.Body mass index is 34.92 kg/(m^2).  General Appearance: Guarded  Eye Contact::  Good  Speech:  Clear and Coherent and Slow  Volume:  Decreased  Mood:  Anxious, Dysphoric and Worthless  Affect:  Constricted, Depressed and Tearful  Thought Process:   Coherent and Goal Directed  Orientation:  Full (Time, Place, and Person)  Thought Content:  WDL  Suicidal Thoughts:  No  Homicidal Thoughts:  No  Memory:  Immediate;   Fair Recent;   Fair  Judgement:  Intact  Insight:  Fair  Psychomotor Activity:  Decreased  Concentration:  Good  Recall:  Good  Fund of Knowledge:Good  Language: Good  Akathisia:  Negative  Handed:  Right  AIMS (if indicated):     Assets:  Communication Skills Desire for Improvement Housing Leisure Time Resilience Social Support  ADL's:  Intact  Cognition: WNL  Sleep:      Medical Decision Making: Review of Psycho-Social Stressors (1), Review or order clinical lab tests (1), Established Problem, Worsening (2), Review of Last Therapy Session (1), Review or order medicine tests (1), Review of Medication Regimen & Side Effects (2) and Review of New Medication or Change in Dosage (2)  Treatment Plan Summary: Patient presented with the symptoms of depression, anxiety and history of substance abuse. Patient contract for safety while in the hospital. Daily contact with patient to assess and evaluate symptoms and progress in treatment and Medication management  Plan:  Continue Seroquel 100 mg at bedtime for mood and insomnia Monitor for opiate and benzodiazepine withdrawal symptoms Patient does not meet criteria for psychiatric inpatient admission. Supportive therapy provided about ongoing stressors.  Appreciate psychiatric consultation Please contact [phone] or [phone] if needs further assistance  Disposition: Patient will be referred to the outpatient medication management and counseling services and medically stable.  Manu Rubey,JANARDHAHA R. 04/27/2015 10:46 AM

## 2015-04-27 NOTE — Progress Notes (Signed)
Patient having a "coughing spell" RR 118 and sats 100% on 2 L nasal cannula. Breath sounds clear, diminished bilaterally. Forced expiratory wheeze from excessive coughing noted in upper airway. Patient grasping ribs stating "please help me" requesting pain meds. RN Karleen Hampshire aware and at bedside.

## 2015-04-27 NOTE — Progress Notes (Signed)
MD Blake Divine paged to make her aware of "coughing spell" witnessed by myself and RT [facility].

## 2015-04-27 NOTE — Progress Notes (Signed)
Progress Note  Julie Kerr ZOX:[id number] DOB: 26-Aug-1975 DOA: 04/24/2015 PCP: No primary care provider on file.  Admit HPI / Brief Narrative: 39 BF PMHx  Anxiety, Asthma, COPD, bronchitis, HTN, Heart murmur, Diabetes Type 2 , S/P Spinal Fusion and Tracheostomy, Alcoholic abuse,VCD   Presented to [facility] ED today after 3 days of progressive wheezing and SOB not responsive to her usually inhalers and nebulized rescue medication. In the ED at [facility] she was given solumedrol, duonebs and was discharged home. She then returned to the [facility] ED with symptoms that were persistent if not worse. She denies any recent sick contacts and/or fevers, chills. She does c/o of some disurea and cough that has been present for the same 3 days. She states her home is full of mold and this is a frequent trigger for her asthma.  HPI/Subjective: She reports persistent sob and cough. And worsening pain in the ribs from coughing .  Assessment/Plan: CAP on CXR likely trigger for VCD/Asthma. - duonebs TID - methylprednisolone IV 40mg  TID: Continue to taper -speech therapy.  -Respiratory virus panel, pending  strep pneumo/Legionella urine antigen negative.  -Continue current antibiotics for complete 7 day course.  -Repeat PCXR in a.m , showed no pneumonia.   Hyperglycemia -Iatrogenic -Continue resistant SSI, start Lantus 8 units daily -Change diet to carb modified - CBG (last 3)   Recent Labs  04/27/15 0916 04/27/15 1228 04/27/15 1654  GLUCAP 179* 129* 199*      Alcohol abuse -patient drinks 1 bottle of wine 2-3x per week, -CIWA protocol   Depression/Anxiety -Start Prozac 20 mg daily -CIWA protocol will also cover anxiety; DC Xanax  Schizophrenia? -Continue Seroquel 100 mg BID -Psych consulted for diagnosis, treatment plan, long-term outpatient plan.  Chronic pain syndrome -Secondary to back surgery 2   Code Status: FULL Family Communication: no family present at time of  exam Disposition Plan:  When symptoms have improved.     Consultants:  Psychiatry.   Procedure/Significant Events: 9/9 PCXR;Developing interstitial infiltration in the right lung base suggesting pneumonia   Culture 9/10 MRSA by PCR negative 9/10 blood pending 9/10 respiratory virus panel pending 9/11 strep pneumo urine antigen negative.  9/11 legionella urine antigen negative.  9/11 sputum pending  Antibiotics: Levofloxacin 9/10>>  DVT prophylaxis: Lovenox   Devices    LINES / TUBES:      Continuous Infusions:   Objective: VITAL SIGNS: Temp: 97.2 F (36.2 C) (09/12 1522) Temp Source: Oral (09/12 1522) BP: 130/55 mmHg (09/12 1522) Pulse Rate: 95 (09/12 1522) SPO2; FIO2:   Intake/Output Summary (Last 24 hours) at 04/27/15 1843 Last data filed at 04/27/15 1328  Gross per 24 hour  Intake    600 ml  Output   2100 ml  Net  -1500 ml     Exam: General:  A/O 4, No acute respiratory distress(SPO2 100% on room air) sees  Lungs:  Air entry poor, scattered wheezing heard.  CVS: S1s2, tachycardic.  Abdomen: soft non tender non distended bowel sounds heard.  Extremities: trace pedale dema.    Data Reviewed: Basic Metabolic Panel:  Recent Labs Lab 04/24/15 2233 04/25/15 8119  04/26/15 0715  04/27/15 0058 04/27/15 0512 04/27/15 0854 04/27/15 1308 04/27/15 1704  NA 136 137  --  137  --   --  138  --   --   --   K 3.7 4.4  --  4.4  --   --  4.3  --   --   --  CL 105 109  --  107  --   --  105  --   --   --   CO2 17* 18*  --  22  --   --  26  --   --   --   GLUCOSE 311* 190*  --  162*  --   --  153*  --   --   --   BUN 10 8  --  10  --   --  12  --   --   --   CREATININE 0.90 0.80  --  0.84  --   --  0.79  --   --   --   CALCIUM 9.6 9.2  --  9.4  --   --  9.3  --   --   --   MG 2.2 2.6*  < > 1.9  < > 1.7 1.7 1.8 1.7 1.8  PHOS 2.8  --   --   --   --   --   --   --   --   --   < > = values in this interval not displayed. Liver Function  Tests:  Recent Labs Lab 04/27/15 0512  AST 33  ALT 27  ALKPHOS 56  BILITOT <0.1*  PROT 6.3*  ALBUMIN 3.2*   No results for input(s): LIPASE, AMYLASE in the last 168 hours. No results for input(s): AMMONIA in the last 168 hours. CBC:  Recent Labs Lab 04/24/15 2233 04/25/15 0635 04/26/15 0715 04/27/15 0512  WBC 16.4* 17.8* 27.0* 21.3*  NEUTROABS  --   --   --  19.3*  HGB 11.1* 9.9* 9.6* 9.9*  HCT 35.0* 31.4* 30.8* 31.9*  MCV 80.3 78.3 79.8 80.4  PLT 417* 337 357 346   Cardiac Enzymes: No results for input(s): CKTOTAL, CKMB, CKMBINDEX, TROPONINI in the last 168 hours. BNP (last 3 results) No results for input(s): BNP in the last 8760 hours.  ProBNP (last 3 results)  Recent Labs  05/06/14 2328 07/25/14 1718 07/25/14 1844  PROBNP <5.0 <5.0 <5.0    CBG:  Recent Labs Lab 04/27/15 0020 04/27/15 0407 04/27/15 0916 04/27/15 1228 04/27/15 1654  GLUCAP 257* 168* 179* 129* 199*    Recent Results (from the past 240 hour(s))  MRSA PCR Screening     Status: None   Collection Time: 04/25/15  2:48 AM  Result Value Ref Range Status   MRSA by PCR NEGATIVE NEGATIVE Final    Comment:        The GeneXpert MRSA Assay (FDA approved for NASAL specimens only), is one component of a comprehensive MRSA colonization surveillance program. It is not intended to diagnose MRSA infection nor to guide or monitor treatment for MRSA infections.   Culture, blood (routine x 2)     Status: None (Preliminary result)   Collection Time: 04/25/15  4:20 AM  Result Value Ref Range Status   Specimen Description BLOOD LEFT HAND  Final   Special Requests BOTTLES DRAWN AEROBIC ONLY 10CC  Final   Culture NO GROWTH 2 DAYS  Final   Report Status PENDING  Incomplete  Culture, blood (routine x 2)     Status: None (Preliminary result)   Collection Time: 04/25/15  4:35 AM  Result Value Ref Range Status   Specimen Description BLOOD RIGHT ANTECUBITAL  Final   Special Requests BOTTLES DRAWN  AEROBIC AND ANAEROBIC 10CC  Final   Culture NO GROWTH 2 DAYS  Final   Report Status PENDING  Incomplete     Studies:  Recent x-ray studies have been reviewed in detail by the Attending Physician  Scheduled Meds:  Scheduled Meds: . arformoterol  15 mcg Nebulization BID  . azelastine  2 spray Each Nare BID  . budesonide  0.5 mg Nebulization BID  . enoxaparin (LOVENOX) injection  40 mg Subcutaneous Q24H  . FLUoxetine  20 mg Oral Daily  . fluticasone  2 spray Each Nare BID  . [START ON 04/28/2015] folic acid  1 mg Oral Daily  . insulin aspart  0-20 Units Subcutaneous 6 times per day  . insulin glargine  8 Units Subcutaneous Daily  . ipratropium-albuterol  3 mL Nebulization Q6H  . levofloxacin (LEVAQUIN) IV  750 mg Intravenous Q24H  . methylPREDNISolone (SOLU-MEDROL) injection  40 mg Intravenous 3 times per day  . morphine  30 mg Oral Q12H  . pantoprazole  40 mg Oral Daily  . pregabalin  25 mg Oral BID  . QUEtiapine  100 mg Oral BID  . [START ON 04/28/2015] thiamine  100 mg Oral Daily    Time spent on care of this patient: 40 mins   Parish Dubose , MD  [facility] Office  [phone] Pager - [phone]  On-Call/Text Page:      [email or web address]      password [email or web address]  If 7PM-7AM, please contact night-coverage [email or web address] Password [facility] 04/27/2015, 6:43 PM   LOS: 2 days

## 2015-04-28 ENCOUNTER — Inpatient Hospital Stay (HOSPITAL_COMMUNITY): Payer: Medicare Other

## 2015-04-28 DIAGNOSIS — G894 Chronic pain syndrome: Secondary | ICD-10-CM

## 2015-04-28 DIAGNOSIS — F1010 Alcohol abuse, uncomplicated: Secondary | ICD-10-CM

## 2015-04-28 DIAGNOSIS — F419 Anxiety disorder, unspecified: Secondary | ICD-10-CM

## 2015-04-28 DIAGNOSIS — J189 Pneumonia, unspecified organism: Secondary | ICD-10-CM

## 2015-04-28 DIAGNOSIS — J9621 Acute and chronic respiratory failure with hypoxia: Secondary | ICD-10-CM

## 2015-04-28 LAB — RESPIRATORY VIRUS PANEL
ADENOVIRUS: NEGATIVE
Influenza A: NEGATIVE
Influenza B: NEGATIVE
METAPNEUMOVIRUS: NEGATIVE
PARAINFLUENZA 3 A: NEGATIVE
Parainfluenza 1: NEGATIVE
Parainfluenza 2: NEGATIVE
RESPIRATORY SYNCYTIAL VIRUS A: NEGATIVE
RHINOVIRUS: NEGATIVE
Respiratory Syncytial Virus B: NEGATIVE

## 2015-04-28 LAB — LEGIONELLA ANTIGEN, URINE

## 2015-04-28 LAB — MAGNESIUM
MAGNESIUM: 2 mg/dL (ref 1.7–2.4)
MAGNESIUM: 2 mg/dL (ref 1.7–2.4)
Magnesium: 2 mg/dL (ref 1.7–2.4)

## 2015-04-28 LAB — EXPECTORATED SPUTUM ASSESSMENT W REFEX TO RESP CULTURE

## 2015-04-28 LAB — GLUCOSE, CAPILLARY
GLUCOSE-CAPILLARY: 110 mg/dL — AB (ref 65–99)
GLUCOSE-CAPILLARY: 147 mg/dL — AB (ref 65–99)
GLUCOSE-CAPILLARY: 206 mg/dL — AB (ref 65–99)
Glucose-Capillary: 146 mg/dL — ABNORMAL HIGH (ref 65–99)
Glucose-Capillary: 190 mg/dL — ABNORMAL HIGH (ref 65–99)
Glucose-Capillary: 186 mg/dL — ABNORMAL HIGH (ref 65–99)

## 2015-04-28 LAB — BORDETELLA PERTUSSIS PCR
B PARAPERTUSSIS, DNA: NEGATIVE
B pertussis, DNA: NEGATIVE

## 2015-04-28 LAB — EXPECTORATED SPUTUM ASSESSMENT W GRAM STAIN, RFLX TO RESP C

## 2015-04-28 LAB — HEMOGLOBIN A1C
HEMOGLOBIN A1C: 5.9 % — AB (ref 4.8–5.6)
Mean Plasma Glucose: 123 mg/dL

## 2015-04-28 MED ORDER — ALPRAZOLAM 0.5 MG PO TABS
1.0000 mg | ORAL_TABLET | Freq: Three times a day (TID) | ORAL | Status: DC
  Administered 2015-04-28 – 2015-04-30 (×6): 1 mg via ORAL
  Filled 2015-04-28 (×7): qty 2

## 2015-04-28 MED ORDER — LORAZEPAM 2 MG/ML IJ SOLN: 1.0000 mg | Freq: Four times a day (QID) | INTRAMUSCULAR | Status: DC | PRN

## 2015-04-28 MED ORDER — ADULT MULTIVITAMIN W/MINERALS CH
1.0000 | ORAL_TABLET | Freq: Every day | ORAL | Status: DC
  Administered 2015-04-28 – 2015-04-30 (×3): 1 via ORAL
  Filled 2015-04-28 (×2): qty 1

## 2015-04-28 MED ORDER — FOLIC ACID 1 MG PO TABS: 1.0000 mg | ORAL_TABLET | Freq: Every day | ORAL | Status: DC

## 2015-04-28 MED ORDER — INSULIN ASPART 100 UNIT/ML ~~LOC~~ SOLN
0.0000 [IU] | Freq: Three times a day (TID) | SUBCUTANEOUS | Status: DC
  Administered 2015-04-29: 7 [IU] via SUBCUTANEOUS
  Administered 2015-04-29 – 2015-04-30 (×3): 4 [IU] via SUBCUTANEOUS

## 2015-04-28 MED ORDER — IPRATROPIUM-ALBUTEROL 0.5-2.5 (3) MG/3ML IN SOLN
3.0000 mL | Freq: Once | RESPIRATORY_TRACT | Status: AC
  Administered 2015-04-28: 3 mL via RESPIRATORY_TRACT
  Filled 2015-04-28: qty 3

## 2015-04-28 MED ORDER — IPRATROPIUM-ALBUTEROL 0.5-2.5 (3) MG/3ML IN SOLN
3.0000 mL | Freq: Four times a day (QID) | RESPIRATORY_TRACT | Status: DC
  Administered 2015-04-28 – 2015-04-30 (×10): 3 mL via RESPIRATORY_TRACT
  Filled 2015-04-28 (×10): qty 3

## 2015-04-28 MED ORDER — DOCUSATE SODIUM 100 MG PO CAPS
100.0000 mg | ORAL_CAPSULE | Freq: Two times a day (BID) | ORAL | Status: DC
  Administered 2015-04-29 – 2015-04-30 (×3): 100 mg via ORAL
  Filled 2015-04-28 (×3): qty 1

## 2015-04-28 MED ORDER — VITAMIN B-1 100 MG PO TABS: 100.0000 mg | ORAL_TABLET | Freq: Every day | ORAL | Status: DC

## 2015-04-28 MED ORDER — POLYETHYLENE GLYCOL 3350 17 G PO PACK
17.0000 g | PACK | Freq: Every day | ORAL | Status: DC
  Administered 2015-04-28 – 2015-04-30 (×3): 17 g via ORAL
  Filled 2015-04-28 (×3): qty 1

## 2015-04-28 MED ORDER — LORAZEPAM 1 MG PO TABS
1.0000 mg | ORAL_TABLET | Freq: Four times a day (QID) | ORAL | Status: DC | PRN
  Administered 2015-04-29: 1 mg via ORAL
  Filled 2015-04-28: qty 1

## 2015-04-28 MED ORDER — THIAMINE HCL 100 MG/ML IJ SOLN: 100.0000 mg | Freq: Every day | INTRAMUSCULAR | Status: DC

## 2015-04-28 NOTE — Progress Notes (Signed)
Pt complained of tightness/pressure in her chest. VSS stable. EKG done.  3L of O2 given..   of morphine given and pt stated it helped the pain "a little"  Pt began to have a "coughing spell" and PRN cough medication was given also.  RT [facility] notified. Doctor notified and Order placed for a breathing treatment. RN will continue to monitor  Lesly Dukes, RN

## 2015-04-28 NOTE — Progress Notes (Signed)
[facility]                                                                              Patient Demographics  Julie Kerr, is a 39 y.o. female, DOB - [birth year], WJX:[id number]  Admit date - 04/24/2015   Admitting Physician [name], MD  Outpatient Primary MD for the patient is No primary care provider on file.  LOS - 3   Chief Complaint  Patient presents with  . Respiratory Distress       Brief HPI   Patient is [name] PMHx Anxiety, Asthma, COPD, bronchitis, HTN, Heart murmur, Diabetes Type 2 , S/P Spinal Fusion and Tracheostomy, Alcoholic abuse,VCD. Patient presented to ED after 3 days of progressive wheezing and SOB not responsive to her usually inhalers and nebulized rescue medication. In the ED at [facility] she was given solumedrol, duonebs and was discharged home. She then returned to the [facility] ED with symptoms that were persistent if not worse. She denied any recent sick contacts and/or fevers, chills. She complained of dyspnea and cough present for 3 days. She also reported that her home is full of mold and this is a frequent trigger for her asthma    Assessment & Plan    Principal Problem:   Acute on chronic respiratory failure with hypoxia, committed acquired pneumonia with vocal cord dysfunction, acute on chronic COPD exacerbation - Continue Brovana, Flonase, Pulmicort, IV Solu-Medrol, duo nebs, IV Levaquin  Active Problems:   Acute on Chronic pain syndrome with significant anxiety component - Patient complaining of pleuritic chest pain and abdominal spasms, continue MS Contin, oxycodone, IV morphine as needed    Vocal cord dysfunction - As #1    Generalized anxiety disorder - Continue Seroquel, Ativan as needed - Patient is on significant amount of Xanax at home, restart 1mg  TID to avoid withdrawals    Polysubstance abuse, alcohol abuse Currently on CIWA protocol, cont ativan     Code Status: full   Family Communication:  Discussed in detail with the patient, all imaging results, lab results explained to the patient    Disposition Plan: not medically ready  Time Spent in minutes  25 minutes  Procedures  CXR   Consults   None   DVT Prophylaxis  Lovenox   Medications  Scheduled Meds: . arformoterol  15 mcg Nebulization BID  . azelastine  2 spray Each Nare BID  . budesonide  0.5 mg Nebulization BID  . enoxaparin (LOVENOX) injection  40 mg Subcutaneous Q24H  . FLUoxetine  20 mg Oral Daily  . fluticasone  2 spray Each Nare BID  . folic acid  1 mg Oral Daily  . insulin aspart  0-20 Units Subcutaneous 6 times per day  . insulin glargine  8 Units Subcutaneous Daily  . ipratropium-albuterol  3 mL Nebulization Q6H  . levofloxacin (LEVAQUIN) IV  750 mg Intravenous Q24H  . methylPREDNISolone (SOLU-MEDROL) injection  40 mg Intravenous 3 times per day  . morphine  30 mg Oral Q12H  . pantoprazole  40 mg Oral Daily  . pregabalin  25 mg Oral BID  . QUEtiapine  100 mg  Oral BID  . thiamine  100 mg Oral Daily   Continuous Infusions:  PRN Meds:.sodium chloride, LORazepam, morphine injection, oxyCODONE, promethazine-codeine, zolpidem   Antibiotics   Anti-infectives    Start     Dose/Rate Route Frequency Ordered Stop   04/25/15 1000  azithromycin (ZITHROMAX) tablet 500 mg  Status:  Discontinued     500 mg Oral Daily 04/25/15 0015 04/25/15 0312   04/25/15 0315  cefTRIAXone (ROCEPHIN) 1 g in dextrose 5 % 50 mL IVPB  Status:  Discontinued     1 g 100 mL/hr over 30 Minutes Intravenous Every 24 hours 04/25/15 0312 04/26/15 1027   04/25/15 0315  levofloxacin (LEVAQUIN) IVPB 750 mg     750 mg 100 mL/hr over 90 Minutes Intravenous Every 24 hours 04/25/15 1610          Subjective:   Dianca Owensby was seen and examined today. Dramatically anxious and crying as soon as I entered the room, throughout the encounter and examination. States she is crying due to pain, pleuritic in her chest and abdominal spasms  due to coughing. No acute events overnight.  Afebrile. Per respiratory therapist, patient was calm after I left the room.  Objective:   Blood pressure 122/71, pulse 83, temperature 98.9 F (37.2 C), temperature source Oral, resp. rate 20, height 5\' 3"  (1.6 m), weight 89.4 kg (197 lb 1.5 oz), last menstrual period 04/16/2015, SpO2 98 %.  Wt Readings from Last 3 Encounters:  04/27/15 89.4 kg (197 lb 1.5 oz)  03/28/15 83.8 kg (184 lb 11.9 oz)  01/19/15 81.647 kg (180 lb)     Intake/Output Summary (Last 24 hours) at 04/28/15 1238 Last data filed at 04/28/15 0516  Gross per 24 hour  Intake    660 ml  Output   2200 ml  Net  -1540 ml    Exam  General: Alert and oriented, crying and anxious  HEENT:  PERRLA, EOMI, Anicteric Sclera, mucous membranes moist.   Neck: Supple, no JVD, no masses  CVS: S1 S2 auscultated, no rubs, murmurs or gallops. Regular rate and rhythm.  Respiratory: Patient kept making adventitious sounds, difficult to auscultate lung sounds  Abdomen: Soft, nondistended, + bowel sounds  Ext: no cyanosis clubbing or edema  Neuro: AAOx3, Cr N's II- XII. Strength 5/5 upper and lower extremities bilaterally  Skin: No rashes  Psych: dramatically anxious and crying through the encounter   Data Review   Micro Results Recent Results (from the past 240 hour(s))  MRSA PCR Screening     Status: None   Collection Time: 04/25/15  2:48 AM  Result Value Ref Range Status   MRSA by PCR NEGATIVE NEGATIVE Final    Comment:        The GeneXpert MRSA Assay (FDA approved for NASAL specimens only), is one component of a comprehensive MRSA colonization surveillance program. It is not intended to diagnose MRSA infection nor to guide or monitor treatment for MRSA infections.   Culture, blood (routine x 2)     Status: None (Preliminary result)   Collection Time: 04/25/15  4:20 AM  Result Value Ref Range Status   Specimen Description BLOOD LEFT HAND  Final   Special  Requests BOTTLES DRAWN AEROBIC ONLY 10CC  Final   Culture NO GROWTH 2 DAYS  Final   Report Status PENDING  Incomplete  Culture, blood (routine x 2)     Status: None (Preliminary result)   Collection Time: 04/25/15  4:35 AM  Result Value Ref Range Status  Specimen Description BLOOD RIGHT ANTECUBITAL  Final   Special Requests BOTTLES DRAWN AEROBIC AND ANAEROBIC 10CC  Final   Culture NO GROWTH 2 DAYS  Final   Report Status PENDING  Incomplete  Culture, expectorated sputum-assessment     Status: None   Collection Time: 04/28/15 10:42 AM  Result Value Ref Range Status   Specimen Description EXPECTORATED SPUTUM  Final   Special Requests NONE  Final   Sputum evaluation   Final    THIS SPECIMEN IS ACCEPTABLE. RESPIRATORY CULTURE REPORT TO FOLLOW.   Report Status 04/28/2015 FINAL  Final    Radiology Reports Dg Chest 2 View  04/24/2015   CLINICAL DATA:  Cough and wheezing.  Painful respirations  EXAM: CHEST  2 VIEW  COMPARISON:  03/25/2015  FINDINGS: The heart size and mediastinal contours are within normal limits. Both lungs are clear. The visualized skeletal structures are unremarkable.  IMPRESSION: No active cardiopulmonary disease.   Electronically Signed   By: Signa Kell M.D.   On: 04/24/2015 18:23   Dg Chest Port 1 View  04/27/2015   CLINICAL DATA:  Shortness of breath and cough  EXAM: PORTABLE CHEST - 1 VIEW  COMPARISON:  04/24/2015  FINDINGS: Normal heart size and mediastinal contours. Hypoventilation. No acute infiltrate or edema. No effusion or pneumothorax. No acute osseous findings.  IMPRESSION: No active disease.   Electronically Signed   By: Marnee Spring M.D.   On: 04/27/2015 08:09   Dg Chest Portable 1 View  04/24/2015   CLINICAL DATA:  Labored breathing with audible wheezes and stridor.  EXAM: PORTABLE CHEST - 1 VIEW  COMPARISON:  04/24/2015  FINDINGS: Shallow inspiration. Normal heart size and pulmonary vascularity. There is increasing interstitial infiltration in the right  lung base suggesting early pneumonia. No blunting of costophrenic angles. No pneumothorax. Mediastinal contours appear intact.  IMPRESSION: Developing interstitial infiltration in the right lung base suggesting pneumonia.   Electronically Signed   By: Burman Nieves M.D.   On: 04/24/2015 23:36    CBC  Recent Labs Lab 04/24/15 2233 04/25/15 0635 04/26/15 0715 04/27/15 0512  WBC 16.4* 17.8* 27.0* 21.3*  HGB 11.1* 9.9* 9.6* 9.9*  HCT 35.0* 31.4* 30.8* 31.9*  PLT 417* 337 357 346  MCV 80.3 78.3 79.8 80.4  MCH 25.5* 24.7* 24.9* 24.9*  MCHC 31.7 31.5 31.2 31.0  RDW 15.1 14.9 15.7* 15.6*  LYMPHSABS  --   --   --  1.1  MONOABS  --   --   --  0.9  EOSABS  --   --   --  0.0  BASOSABS  --   --   --  0.0    Chemistries   Recent Labs Lab 04/24/15 2233 04/25/15 0635  04/26/15 0715  04/27/15 0512  04/27/15 1704 04/27/15 2100 04/28/15 0121 04/28/15 0521 04/28/15 0915  NA 136 137  --  137  --  138  --   --   --   --   --   --   K 3.7 4.4  --  4.4  --  4.3  --   --   --   --   --   --   CL 105 109  --  107  --  105  --   --   --   --   --   --   CO2 17* 18*  --  22  --  26  --   --   --   --   --   --  GLUCOSE 311* 190*  --  162*  --  153*  --   --   --   --   --   --   BUN 10 8  --  10  --  12  --   --   --   --   --   --   CREATININE 0.90 0.80  --  0.84  --  0.79  --   --   --   --   --   --   CALCIUM 9.6 9.2  --  9.4  --  9.3  --   --   --   --   --   --   MG 2.2 2.6*  < > 1.9  < > 1.7  < > 1.8 1.8 2.0 2.0 2.0  AST  --   --   --   --   --  33  --   --   --   --   --   --   ALT  --   --   --   --   --  27  --   --   --   --   --   --   ALKPHOS  --   --   --   --   --  56  --   --   --   --   --   --   BILITOT  --   --   --   --   --  <0.1*  --   --   --   --   --   --   < > = values in this interval not displayed. ------------------------------------------------------------------------------------------------------------------ estimated creatinine clearance is 102.1 mL/min (by  C-G formula based on Cr of 0.79). ------------------------------------------------------------------------------------------------------------------  Recent Labs  04/27/15 0058  HGBA1C 5.9*   ------------------------------------------------------------------------------------------------------------------ No results for input(s): CHOL, HDL, LDLCALC, TRIG, CHOLHDL, LDLDIRECT in the last 72 hours. ------------------------------------------------------------------------------------------------------------------ No results for input(s): TSH, T4TOTAL, T3FREE, THYROIDAB in the last 72 hours.  Invalid input(s): FREET3 ------------------------------------------------------------------------------------------------------------------ No results for input(s): VITAMINB12, FOLATE, FERRITIN, TIBC, IRON, RETICCTPCT in the last 72 hours.  Coagulation profile No results for input(s): INR, PROTIME in the last 168 hours.  No results for input(s): DDIMER in the last 72 hours.  Cardiac Enzymes No results for input(s): CKMB, TROPONINI, MYOGLOBIN in the last 168 hours.  Invalid input(s): CK ------------------------------------------------------------------------------------------------------------------ Invalid input(s): POCBNP   Recent Labs  04/27/15 1654 04/27/15 2036 04/28/15 0004 04/28/15 0448 04/28/15 0800 04/28/15 1152  GLUCAP 199* 220* 147* 206* 110* 146*     RAI,RIPUDEEP M.D. [facility] 04/28/2015, 12:38 PM  Pager: [phone] Between 7am to 7pm - call Pager - [phone]  After 7pm go to [email or web address] - password [email or web address]  Call night coverage person covering after 7pm

## 2015-04-28 NOTE — Progress Notes (Signed)
Inpatient Diabetes Program Recommendations  AACE/ADA: New Consensus Statement on Inpatient Glycemic Control (2015)  Target Ranges:  Prepandial:   less than 140 mg/dL      Peak postprandial:   less than 180 mg/dL (1-2 hours)      Critically ill patients:  140 - 180 mg/dL    Results for KINGSTON, GUILES (MRN [id number]) as of 04/28/2015 11:43  Ref. Range 04/27/2015 00:20 04/27/2015 04:07 04/27/2015 09:16 04/27/2015 12:28 04/27/2015 16:54 04/27/2015 20:36  Glucose-Capillary Latest Ref Range: 65-99 mg/dL 409 (H) 811 (H) 914 (H) 129 (H) 199 (H) 220 (H)    Results for KEYNA, BLIZARD (MRN [id number]) as of 04/28/2015 11:43  Ref. Range 04/28/2015 00:04 04/28/2015 04:48 04/28/2015 08:00  Glucose-Capillary Latest Ref Range: 65-99 mg/dL 086 (H) 578 (H) 469 (H)     Results for IVETT, LUEBBE (MRN [id number]) as of 04/28/2015 11:43  Ref. Range 04/27/2015 00:58  Hemoglobin A1C Latest Ref Range: 4.8-5.6 % 5.9 (H)   Current Insulin Orders: Lantus 8 units daily          Novolog Resistant SSI (0-20 units) Q4 hours     -Patient currently receiving IV Solumedrol 40 mg Q8 hours.  -Having glucose elevations likely due to steroids.  -A1c 5.9%.  -Patient currently eating 100% of meals.    MD- Please consider the following in-hospital insulin adjustments:  1. Change Novolog Resistant SSI to TID AC + HS (currently ordered as Q4 hours and patient is eating PO diet)  2. If patient continues to have elevated postprandial glucose levels, please consider adding Novolog Meal Coverage- Novolog 3 units tid with meals     Will follow Ambrose Finland RN, MSN, CDE Diabetes Coordinator Inpatient Glycemic Control Team Team Pager: [phone] (8a-5p)

## 2015-04-29 ENCOUNTER — Telehealth: Payer: Self-pay | Admitting: Pulmonary Disease

## 2015-04-29 DIAGNOSIS — F411 Generalized anxiety disorder: Secondary | ICD-10-CM

## 2015-04-29 LAB — CBC
HEMATOCRIT: 31.1 % — AB (ref 36.0–46.0)
Hemoglobin: 10 g/dL — ABNORMAL LOW (ref 12.0–15.0)
MCH: 25.8 pg — ABNORMAL LOW (ref 26.0–34.0)
MCHC: 32.2 g/dL (ref 30.0–36.0)
MCV: 80.4 fL (ref 78.0–100.0)
Platelets: 339 10*3/uL (ref 150–400)
RBC: 3.87 MIL/uL (ref 3.87–5.11)
RDW: 15.4 % (ref 11.5–15.5)
WBC: 19.5 10*3/uL — ABNORMAL HIGH (ref 4.0–10.5)

## 2015-04-29 LAB — BASIC METABOLIC PANEL
Anion gap: 11 (ref 5–15)
BUN: 18 mg/dL (ref 6–20)
CALCIUM: 9 mg/dL (ref 8.9–10.3)
CO2: 28 mmol/L (ref 22–32)
CREATININE: 0.88 mg/dL (ref 0.44–1.00)
Chloride: 96 mmol/L — ABNORMAL LOW (ref 101–111)
GFR calc Af Amer: >60 mL/min (ref >60)
GFR calc non Af Amer: >60 mL/min (ref >60)
GLUCOSE: 207 mg/dL — AB (ref 65–99)
Potassium: 4.2 mmol/L (ref 3.5–5.1)
Sodium: 135 mmol/L (ref 135–145)

## 2015-04-29 LAB — GLUCOSE, CAPILLARY
GLUCOSE-CAPILLARY: 210 mg/dL — AB (ref 65–99)
GLUCOSE-CAPILLARY: 192 mg/dL — AB (ref 65–99)
Glucose-Capillary: 156 mg/dL — ABNORMAL HIGH (ref 65–99)
Glucose-Capillary: 168 mg/dL — ABNORMAL HIGH (ref 65–99)

## 2015-04-29 MED ORDER — ONDANSETRON HCL 4 MG/2ML IJ SOLN
4.0000 mg | INTRAMUSCULAR | Status: DC | PRN
  Administered 2015-04-29: 4 mg via INTRAVENOUS

## 2015-04-29 MED ORDER — PREDNISONE 20 MG PO TABS
40.0000 mg | ORAL_TABLET | Freq: Every day | ORAL | Status: DC
  Administered 2015-04-30: 40 mg via ORAL
  Filled 2015-04-29: qty 2

## 2015-04-29 MED ORDER — ONDANSETRON HCL 4 MG/2ML IJ SOLN
INTRAMUSCULAR | Status: AC
  Filled 2015-04-29: qty 2

## 2015-04-29 MED ORDER — INSULIN GLARGINE 100 UNIT/ML ~~LOC~~ SOLN
14.0000 [IU] | Freq: Every day | SUBCUTANEOUS | Status: DC
  Administered 2015-04-29 – 2015-04-30 (×2): 14 [IU] via SUBCUTANEOUS
  Filled 2015-04-29 (×4): qty 0.14

## 2015-04-29 MED ORDER — INSULIN GLARGINE 100 UNIT/ML ~~LOC~~ SOLN
10.0000 [IU] | Freq: Every day | SUBCUTANEOUS | Status: DC
Start: 2015-04-29 — End: 2015-04-29

## 2015-04-29 NOTE — Progress Notes (Signed)
04/28/15 2044  What Happened  Was fall witnessed? Yes  Who witnessed fall? family member  Patients activity before fall to/from bed, chair, or stretcher;other (comment) (going to sit on BSC to wash up, unassisted)  Point of contact hip/leg;back  Was patient injured? Yes  Follow Up  MD notified Merdis Delay NP  Time MD notified 2050  Family notified Yes-comment  Time family notified 2045  Additional tests Yes-comment (Left shoulder, left hip, lower back X-ray)  Simple treatment Other (comment) (comfort measures)  Progress note created (see row info) Yes  Adult Fall Risk Assessment  Risk Factor Category (scoring not indicated) High fall risk per protocol (document High fall risk)  Patient's Fall Risk High Fall Risk (>13 points)  Adult Fall Risk Interventions  Required Bundle Interventions *See Row Information* High fall risk - low, moderate, and high requirements implemented  Additional Interventions Fall risk signage;Secure all tubes/drains;Use of appropriate toileting equipment (bedpan, BSC, etc.)  Fall with Injury Screening  Risk For Fall Injury- See Row Information  F;Nurse judgement  Vitals  Temp 98.7 F (37.1 C)  Temp Source Oral  BP 125/79 mmHg  MAP (mmHg) 91  BP Location Right Arm  BP Method Automatic  Patient Position (if appropriate) Sitting  Pulse Rate 92  Pulse Rate Source Monitor  Resp (!) 24  Oxygen Therapy  SpO2 99 %  O2 Device Nasal Cannula  O2 Flow Rate (L/min) 2.5 L/min  Pain Assessment  Pain Assessment 0-10  Pain Score 10  Pain Type Acute pain  Pain Location Leg  Pain Orientation Left  Pain Radiating Towards back; hip  Pain Descriptors / Indicators Aching  Pain Frequency Constant  Pain Onset On-going  Patients Stated Pain Goal 0  Pain Intervention(s) Medication (See eMAR)  Multiple Pain Sites No  PCA/Epidural/Spinal Assessment  Respiratory Pattern Labored  Neurological  Neuro (WDL) X  Level of Consciousness Alert  Orientation Level Oriented  X4  Cognition Appropriate at baseline  Speech Clear  Pupil Assessment  No  R Pupil Size (mm) 3  R Pupil Shape Round  R Pupil Reaction Brisk  L Pupil Size (mm) 3  L Pupil Shape Round  L Pupil Reaction Brisk  Additional Pupil Assessments No  Motor Function/Sensation Assessment Grip  R Hand Grip Present;Moderate  L Hand Grip Present;Moderate   Neuro Symptoms Anxiety;Agitation  Neuro Additional Assessments No  Glasgow Coma Scale  Eye Opening 4  Best Verbal Response (NON-intubated) 5  Best Motor Response 6  Glasgow Coma Scale Score 15  Musculoskeletal  Musculoskeletal (WDL) X  Assistive Device BSC;Four wheel walker  Generalized Weakness Yes  Weight Bearing Restrictions No  Integumentary  Integumentary (WDL) WDL  Pain Assessment  Date Pain First Started 04/28/15  Result of Injury Yes  Pain Screening  Clinical Progression Not changed  Effect of Pain on Daily Activities more difficult to move left leg  Response to Interventions decreased pain  Pain Assessment  Work-Related Injury No

## 2015-04-29 NOTE — Progress Notes (Signed)
PULMONARY / CRITICAL CARE MEDICINE   Name: Julie Kerr MRN: [id number] DOB: May 27, 1976    ADMISSION DATE:  04/24/2015  CHIEF COMPLAINT:  SOB  HISTORY OF PRESENT ILLNESS:  39F with h/o asthma and VCD who presented to [facility] ED today after 3 days of progressive wheezing and SOB not responsive to her usually inhalers and nebulized rescue medication.  In the ED at [facility] she was given solumedrol, duonebs and was discharged home.  She then returned to the [facility] ED with symptoms that were persistent if not worse.  She denied any recent sick contacts and/or fevers, chills. C/o of some dysuria and cough that has been present for the same 3 days.  She states her home is full of mold and this is a frequent trigger for her asthma.    SUBJECTIVE: Called back 9/14 by [facility] for wheezing and increased work of breathing with anxiety.   VITAL SIGNS: Temp:  [98.3 F (36.8 C)-98.7 F (37.1 C)] 98.6 F (37 C) (09/14 1000) Pulse Rate:  [85-96] 96 (09/14 1000) Resp:  [15-24] 18 (09/14 0929) BP: (105-125)/(45-95) 108/77 mmHg (09/14 1000) SpO2:  [96 %-100 %] 100 % (09/14 1000) Weight:  [203 lb 8 oz (92.307 kg)] 203 lb 8 oz (92.307 kg) (09/14 0546)   INTAKE / OUTPUT:  Intake/Output Summary (Last 24 hours) at 04/29/15 1143 Last data filed at 04/29/15 0659  Gross per 24 hour  Intake   1550 ml  Output   4550 ml  Net  -3000 ml    PHYSICAL EXAMINATION: General: morbidly obese F in NAD, lying in bed Neuro:  No focal deficits, AAOx4, speech clear, MAE HEENT:  PERRLA, EOMI Cardiovascular:  RRR, No MRG Lungs:  resp's even/non-labored, lungs bilaterally clear, no wheezing, upper airway noise noted, old trach scar noted Abdomen:  NTND, bsx4 active Skin:  Warm/dry, no edema  LABS:  CBC  Recent Labs Lab 04/26/15 0715 04/27/15 0512 04/29/15 0544  WBC 27.0* 21.3* 19.5*  HGB 9.6* 9.9* 10.0*  HCT 30.8* 31.9* 31.1*  PLT 357 346 339   BMET  Recent Labs Lab 04/26/15 0715 04/27/15 0512 04/29/15 0544   NA 137 138 135  K 4.4 4.3 4.2  CL 107 105 96*  CO2 22 26 28   BUN 10 12 18   CREATININE 0.84 0.79 0.88  GLUCOSE 162* 153* 207*   ABG  Recent Labs Lab 04/27/15 0313  PHART 7.401  PCO2ART 40.2  PO2ART 74.5*   Glucose  Recent Labs Lab 04/28/15 0448 04/28/15 0800 04/28/15 1152 04/28/15 1728 04/28/15 2209 04/29/15 0813  GLUCAP 206* 110* 146* 190* 186* 210*    Imaging Dg Lumbar Spine Complete  04/28/2015   CLINICAL DATA:  Fall from bed tonight.  Now with low back pain.  EXAM: LUMBAR SPINE - COMPLETE 4+ VIEW  COMPARISON:  02/11/2014  FINDINGS: History fusion L5-S1, unchanged in appearance. There is an interbody spacer. Two right S1 screws are again seen, unchanged. The L5 screws about the superior endplate of the vertebral body. No hardware complication. The alignment is maintained. Vertebral body heights are normal. There is no listhesis. The posterior elements are intact. Disc spaces are preserved. No fracture. Sacroiliac joints are symmetric and normal.  IMPRESSION: 1. No acute fracture or subluxation. 2. L5-S1 posterior fusion, unchanged in appearance.   Electronically Signed   By: Rubye Oaks M.D.   On: 04/28/2015 23:56   Dg Shoulder Left  04/28/2015   CLINICAL DATA:  39 year old female with fall and left shoulder pain  EXAM: LEFT SHOULDER - 2+ VIEW  COMPARISON:  None.  FINDINGS: There is no evidence of fracture or dislocation. There is no evidence of arthropathy or other focal bone abnormality. Soft tissues are unremarkable.  IMPRESSION: Negative.   Electronically Signed   By: Elgie Collard M.D.   On: 04/28/2015 23:54   Dg Hip Unilat With Pelvis 2-3 Views Left  04/28/2015   CLINICAL DATA:  39 year old female with fall and left lateral pelvic pain  EXAM: DG HIP (WITH OR WITHOUT PELVIS) 2-3V LEFT  COMPARISON:  CT of the abdomen and pelvis dated 11/26/2014  FINDINGS: There is no acute fracture or dislocation. Partially visualized lower lumbar fixation screws noted. The soft  tissues are grossly unremarkable.  IMPRESSION: No acute fracture or dislocation.   Electronically Signed   By: Elgie Collard M.D.   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  CAP resolved (9/14) with clear cxr on 9/13.  PCCM called back again 9/14 to see the patient again for upper airway noise and work of breathing.    PULMONARY A:  VCD Seasonal Allergies  Questionable Asthma Resolved CAP  P:   - continue astelin, flonase - transition steroids to PO with quick taper to off >>  x2 days, then  x2 days, then 10 mg x2 days - continue home medications: Brovana, Pulmicort, Duoneb PRN - ALL viral panel negative (pertussis, flu, RSV, etc) - Urine legionella negative  - Discontinue abx, D5/5 - Discontinue droplet precautions >> all viral panel negative, defer to primary - No role for bronchoscopy - No respiratory distress or indication for transfer / intubation - She needs to [facility]!  Get out of bed and lights on during the day.    GLOBAL: PCCM is very familiar with Julie Kerr.  She unfortunately has a poor home life with significant anxiety. She also carries a history of polysubstance abuse.  Anxiety and poor coping skills are most certainly contributing to patients "wheezing and work of breathing" as noted during exam.  She had a normal work of breathing upon entering the room and as she discussed her home situation her breathing and upper airway noise increased.  She discloses multiple home stressors but also has various list of reasons for why she can not change her home life.  No acute airway compromise on exam.     PCCM will sign off.  Please call back if new needs arise.    Canary Brim, NP-C [facility] Pgr: [phone] or if no answer  [phone] 04/29/2015, 11:44 AM

## 2015-04-29 NOTE — Progress Notes (Signed)
Shift event note: Notified by RN at approx 2100 regarding pt having had unwitnessed fall at bedside. RN entered room and noted pt on her (L) leg kneeling as if attempting to pull her self up from the floor. Pt c/o (L) shoulder, (L) hip and LBP s/p fall. RN reported no obvious signs of injury noted. At bedside pt noted resting in bed watching TV in NAD. Pt is alert and oriented and answers questions appropriately. She reports she was attempting to go to br when she fell. She reports falling on her (L) side. MAE's x 4 though does report pain to l-spine area, (L) hip and (L) shoulder w/ passive ROM/movement. There is no bony TTP to c-spine or t-spine areas. Pt denies hitting her head.  Some TTP over posterior (L) hip and over the acromian process of (L) shoulder. There are no abrasions, open wounds or obvious deformities. (L) shoulder, l-spine and (L) hip xrays pending. Assessment/Plan: 1. Unwitnessed fall: Mechanical fall w/o objective signs of trauma/injury. Subjective report of (L) shoulder, (L) hip and l-spine pain (pt is s/p remote low-back surgeries x 2).  (L) shoulder, hip and l-spine xrays all negative. Continue current PRN meds for pain. Pt instructed not to be OOB w/o calling for assistance. Pt agreeable. Will continue to monitor closely on telemetry.  Leanne Chang, NP-C [facility] Pager [phone]

## 2015-04-29 NOTE — Progress Notes (Signed)
At 1303 pt requested PRN pain med. Received Oxycodone IR 15 mg. At 1500 pt anxious and restless, CIWA was 9. Proceeded to give her 1 mg po Ativan, was looking through top drawer in pts room for a water cup and found her 15 mg oxycodone lying in a napkins.  Pt just shrugged shoulders  When I  asked her what this was about. Informed DR Rai of the incident. 04/29/2015 3:54 PM Julie Kerr

## 2015-04-29 NOTE — Telephone Encounter (Signed)
Called # provided and received message it has been changed, d/c or no longer in service. Called alternate # provided and LMTCB x1

## 2015-04-29 NOTE — Consult Note (Signed)
[facility] Face-to-Face Psychiatry Consult Follow up  Reason for Consult:  Substance abuse and depression with anxiety Referring Physician: Dr. Karleen Hampshire Patient Identification: Julie Kerr MRN:  [id number] Principal Diagnosis: Generalized anxiety disorder Diagnosis:   Patient Active Problem List   Diagnosis Date Noted  . CAP (community acquired pneumonia) [J18.9]   . Alcohol abuse [F10.10]   . Depression [F32.9]   . Asthma exacerbation [J45.901] 03/24/2015  . Anxiety [F41.9]   . Respiratory distress [R06.00]   . Acute on chronic respiratory failure with hypoxemia [J96.21] 11/25/2014  . Chronic pain [G89.29]   . Rectal bleeding [K62.5] 10/08/2014  . Blood in stool [K92.1]   . Urinary tract infectious disease [N39.0]   . Acute respiratory failure [J96.00] 08/29/2014  . Adjustment disorder with anxious mood [F43.22]   . 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 Acute on chronic respiratory failure with hypoxia [J96.21]   . Chronic asthmatic bronchitis [J44.9] 08/21/2014  . Tracheostomy status [Z93.0] 08/05/2014  . Cough [R05]   . Polysubstance abuse [F19.10] 07/28/2014  . Metabolic acidosis [W25.8] 07/28/2014  . Encounter for orogastric (OG) tube placement [Z46.59]   . Hyperglycemia [R73.9] 05/07/2014  . Shortness of breath [R06.02] 05/07/2014  . Generalized anxiety disorder [F41.1] 01/20/2014  . Panic attacks [F41.0] 01/20/2014  . Upper airway cough syndrome, severe, with clinical VCD [R05] 12/07/2013  . Stridor [R06.1] 10/29/2013  . SOB (shortness of breath) [R06.02] 10/29/2013  . Dyspnea [R06.00] 09/06/2013  . Vocal cord dysfunction [J38.3] 09/06/2013  . Sepsis [A41.9] 08/04/2013  . Anemia [D64.9] 08/04/2013  . Tobacco use disorder [Z72.0] 06/17/2013  . Chronic pain syndrome [G89.4] 06/17/2013  . Acute respiratory failure with hypoxia [J96.01] 06/14/2013    Total Time spent with patient: 30 minutes  Subjective:   Julie Kerr is a 39 y.o. female patient  admitted with depression, anxiety and substance abuse.  HPI:  Julie Kerr is a 39 years old female admitted to [facility] with increased symptoms of depression anxiety. Patient complains about feeling sad, depressed, worthless, lack of interest and motivation unable to care for herself and her son. Patient is also suffering with the vocal cord dysfunction so she's been talking with low twice. Patient has been taking pain medications for her back pain and Xanax 2 mg 3 times a day from primary care physician and has been taking tapered dose so that she won't ran out of the medication. Patient is also reported she has no local psychiatrist or counselor's. Patient endorses history of multiple psychiatric hospitalizations and recently relocated to [facility] about a few months ago and has a 77 years old son living with her. Patient does not want to stay with the previous place because of polysubstance abuse which she cannot get it off in that environment. Patient denies current abuse or intoxication. Patient urine drug screen positive for opiates and benzodiazepine's. Patient has history of cocaine but currently negative for urine drug screen. Patient has no known alcohol abuse or dependence. Patient reported feeling depressed, tearful and has no outpatient medication management. Patient is willing to start medication and follow-up with outpatient medication management and medically stable patient denies current symptoms of suicidal ideation, homicidal ideation, intention or plans. Patient has no evidence of psychosis.  Interval history: Patient seen today for psychiatric consultation follow-up for depression, anxiety and history of substance abuse. Patient reportedly continues to be depressed, sad, anxious about her housing which has a lot of molds unable to keep her and 35 years old daughter  and 14 days old grandchild. She is also concerned about her 62 years old son who has asthma. Patient reported  her breathing is getting difficult in her house. Patient is seeking for sure service assistance regarding better housing for her and her children. Patient has improved her speech secondary to less vocal cord dysfunction at this time. Reportedly patient become dizzy when she tried to get out of the bed and need to crawl up on her bed last evening. Patient has been compliant with her medication and has no reported adverse affects. Patient continued to have continuous oxygen supply by cannula. Patient has no suicidal/homicidal ideation or evidence of psychosis. Patient does not appear withdrawing from drug of abuse.    Past Medical History:  Past Medical History  Diagnosis Date  . Asthma   . Anxiety   . COPD (chronic obstructive pulmonary disease)   . Bronchitis   . Heart murmur   . Shortness of breath   . Diabetes mellitus without complication   . Hypertension     Past Surgical History  Procedure Laterality Date  . Spinal fusion    . Tracheostomy  December 2015   Family History:  Family History  Problem Relation Age of Onset  . HIV Mother   . Heart disease Father   . CVA Father   . Heart disease Other   . Emphysema Maternal Grandmother     smoked  . Asthma Sister   . Clotting disorder Sister   . Clotting disorder Maternal Grandmother   . Lung cancer Maternal Grandmother     smoked   Social History:  History  Alcohol Use  . 0.0 oz/week  . 0 Standard drinks or equivalent per week    Comment: occasional     History  Drug Use No    Social History   Social History  . Marital Status: Single    Spouse Name: N/A  . Number of Children: N/A  . Years of Education: N/A   Social History Main Topics  . Smoking status: Former Smoker -- 2.00 packs/day for 17 years    Types: Cigarettes    Quit date: 06/15/2014  . Smokeless tobacco: Never Used  . Alcohol Use: 0.0 oz/week    0 Standard drinks or equivalent per week     Comment: occasional  . Drug Use: No  . Sexual Activity:  Yes    Birth Control/ Protection: Other-see comments     Comment: female partners only   Other Topics Concern  . None   Social History Narrative   Additional Social History:                          Allergies:   Allergies  Allergen Reactions  . Robitussin Dm [Dextromethorphan-Guaifenesin] Nausea And Vomiting  . Chocolate Hives  . Nsaids Hives  . Rayon, Purified Hives  . Tramadol Hives    Labs:  Results for orders placed or performed during the hospital encounter of 04/24/15 (from the past 48 hour(s))  Glucose, capillary     Status: Abnormal   Collection Time: 04/27/15 12:28 PM  Result Value Ref Range   Glucose-Capillary 129 (H) 65 - 99 mg/dL  Magnesium     Status: None   Collection Time: 04/27/15  1:08 PM  Result Value Ref Range   Magnesium 1.7 1.7 - 2.4 mg/dL  Glucose, capillary     Status: Abnormal   Collection Time: 04/27/15  4:54 PM  Result Value  Ref Range   Glucose-Capillary 199 (H) 65 - 99 mg/dL  Magnesium     Status: None   Collection Time: 04/27/15  5:04 PM  Result Value Ref Range   Magnesium 1.8 1.7 - 2.4 mg/dL  Glucose, capillary     Status: Abnormal   Collection Time: 04/27/15  8:36 PM  Result Value Ref Range   Glucose-Capillary 220 (H) 65 - 99 mg/dL   Comment 1 Notify RN    Comment 2 Document in Chart   Magnesium     Status: None   Collection Time: 04/27/15  9:00 PM  Result Value Ref Range   Magnesium 1.8 1.7 - 2.4 mg/dL  Glucose, capillary     Status: Abnormal   Collection Time: 04/28/15 12:04 AM  Result Value Ref Range   Glucose-Capillary 147 (H) 65 - 99 mg/dL   Comment 1 Notify RN    Comment 2 Document in Chart   Magnesium     Status: None   Collection Time: 04/28/15  1:21 AM  Result Value Ref Range   Magnesium 2.0 1.7 - 2.4 mg/dL  Glucose, capillary     Status: Abnormal   Collection Time: 04/28/15  4:48 AM  Result Value Ref Range   Glucose-Capillary 206 (H) 65 - 99 mg/dL   Comment 1 Notify RN    Comment 2 Document in Chart    Magnesium     Status: None   Collection Time: 04/28/15  5:21 AM  Result Value Ref Range   Magnesium 2.0 1.7 - 2.4 mg/dL  Glucose, capillary     Status: Abnormal   Collection Time: 04/28/15  8:00 AM  Result Value Ref Range   Glucose-Capillary 110 (H) 65 - 99 mg/dL  Magnesium     Status: None   Collection Time: 04/28/15  9:15 AM  Result Value Ref Range   Magnesium 2.0 1.7 - 2.4 mg/dL  Culture, expectorated sputum-assessment     Status: None   Collection Time: 04/28/15 10:42 AM  Result Value Ref Range   Specimen Description EXPECTORATED SPUTUM    Special Requests NONE    Sputum evaluation      THIS SPECIMEN IS ACCEPTABLE. RESPIRATORY CULTURE REPORT TO FOLLOW.   Report Status 04/28/2015 FINAL   Culture, respiratory (NON-Expectorated)     Status: None (Preliminary result)   Collection Time: 04/28/15 10:42 AM  Result Value Ref Range   Specimen Description SPUTUM    Special Requests NONE    Gram Stain      RARE WBC PRESENT, PREDOMINANTLY PMN RARE SQUAMOUS EPITHELIAL CELLS PRESENT FEW GRAM POSITIVE COCCI IN PAIRS IN CHAINS RARE GRAM POSITIVE RODS Performed at [facility]    Culture      Culture reincubated for better growth Performed at [facility]    Report Status PENDING   Glucose, capillary     Status: Abnormal   Collection Time: 04/28/15 11:52 AM  Result Value Ref Range   Glucose-Capillary 146 (H) 65 - 99 mg/dL  Glucose, capillary     Status: Abnormal   Collection Time: 04/28/15  5:28 PM  Result Value Ref Range   Glucose-Capillary 190 (H) 65 - 99 mg/dL  Glucose, capillary     Status: Abnormal   Collection Time: 04/28/15 10:09 PM  Result Value Ref Range   Glucose-Capillary 186 (H) 65 - 99 mg/dL  CBC     Status: Abnormal   Collection Time: 04/29/15  5:44 AM  Result Value Ref Range   WBC 19.5 (H)  4.0 - 10.5 K/uL   RBC 3.87 3.87 - 5.11 MIL/uL   Hemoglobin 10.0 (L) 12.0 - 15.0 g/dL   HCT 31.1 (L) 36.0 - 46.0 %   MCV 80.4 78.0 - 100.0 fL   MCH 25.8  (L) 26.0 - 34.0 pg   MCHC 32.2 30.0 - 36.0 g/dL   RDW 15.4 11.5 - 15.5 %   Platelets 339 150 - 400 K/uL  Basic metabolic panel     Status: Abnormal   Collection Time: 04/29/15  5:44 AM  Result Value Ref Range   Sodium 135 135 - 145 mmol/L   Potassium 4.2 3.5 - 5.1 mmol/L   Chloride 96 (L) 101 - 111 mmol/L   CO2 28 22 - 32 mmol/L   Glucose, Bld 207 (H) 65 - 99 mg/dL   BUN 18 6 - 20 mg/dL   Creatinine, Ser 0.88 0.44 - 1.00 mg/dL   Calcium 9.0 8.9 - 10.3 mg/dL   GFR calc non Af Amer >60 >60 mL/min   GFR calc Af Amer >60 >60 mL/min    Comment: (NOTE) The eGFR has been calculated using the CKD EPI equation. This calculation has not been validated in all clinical situations. eGFR's persistently <60 mL/min signify possible Chronic Kidney Disease.    Anion gap 11 5 - 15  Glucose, capillary     Status: Abnormal   Collection Time: 04/29/15  8:13 AM  Result Value Ref Range   Glucose-Capillary 210 (H) 65 - 99 mg/dL    Vitals: Blood pressure 108/77, pulse 96, temperature 98.6 F (37 C), temperature source Oral, resp. rate 18, height 5' 3" (1.6 m), weight 92.307 kg (203 lb 8 oz), last menstrual period 04/16/2015, SpO2 100 %.  Risk to Self: Is patient at risk for suicide?: No Risk to Others:   Prior Inpatient Therapy:   Prior Outpatient Therapy:    Current Facility-Administered Medications  Medication Dose Route Frequency Provider Last Rate Last Dose  . 0.9 %  sodium chloride infusion  250 mL Intravenous PRN Roswell Nickel, MD      . ALPRAZolam Duanne Moron) tablet 1 mg  1 mg Oral TID Ripudeep Krystal Eaton, MD   1 mg at 04/29/15 0950  . arformoterol (BROVANA) nebulizer solution 15 mcg  15 mcg Nebulization BID Roswell Nickel, MD   15 mcg at 04/29/15 [id number]  . azelastine (ASTELIN) 0.1 % nasal spray 2 spray  2 spray Each Nare BID Roswell Nickel, MD   2 spray at 04/29/15 [phone]  . budesonide (PULMICORT) nebulizer solution 0.5 mg  0.5 mg Nebulization BID Roswell Nickel, MD   0.5 mg at 04/29/15 [id number]  .  docusate sodium (COLACE) capsule 100 mg  100 mg Oral BID Ripudeep Krystal Eaton, MD   100 mg at 04/29/15 0950  . enoxaparin (LOVENOX) injection 40 mg  40 mg Subcutaneous Q24H Roswell Nickel, MD   40 mg at 04/29/15 1033  . FLUoxetine (PROZAC) capsule 20 mg  20 mg Oral Daily Allie Bossier, MD   20 mg at 04/29/15 0951  . fluticasone (FLONASE) 50 MCG/ACT nasal spray 2 spray  2 spray Each Nare BID Roswell Nickel, MD   2 spray at 04/29/15 [id number]  . folic acid (FOLVITE) tablet 1 mg  1 mg Oral Daily Hosie Poisson, MD   1 mg at 04/29/15 0949  . insulin aspart (novoLOG) injection 0-20 Units  0-20 Units Subcutaneous TID WC Jeryl Columbia, NP   7 Units at 04/29/15 0947  .  insulin glargine (LANTUS) injection 14 Units  14 Units Subcutaneous Daily Ripudeep Krystal Eaton, MD   14 Units at 04/29/15 1033  . ipratropium-albuterol (DUONEB) 0.5-2.5 (3) MG/3ML nebulizer solution 3 mL  3 mL Nebulization Q6H Rhetta Mura Schorr, NP   3 mL at 04/29/15 0928  . levofloxacin (LEVAQUIN) IVPB 750 mg  750 mg Intravenous Q24H Roswell Nickel, MD   750 mg at 04/29/15 0330  . LORazepam (ATIVAN) tablet 1 mg  1 mg Oral Q6H PRN Ripudeep Krystal Eaton, MD       Or  . LORazepam (ATIVAN) injection 1 mg  1 mg Intravenous Q6H PRN Ripudeep K Rai, MD      . methylPREDNISolone sodium succinate (SOLU-MEDROL) 40 mg/mL injection 40 mg  40 mg Intravenous 3 times per day Hosie Poisson, MD   40 mg at 04/29/15 0527  . morphine (MS CONTIN) 12 hr tablet 30 mg  30 mg Oral Q12H Roswell Nickel, MD   30 mg at 04/29/15 0948  . morphine 2 MG/ML injection 1-2 mg  1-2 mg Intravenous Q4H PRN Hosie Poisson, MD   2 mg at 04/29/15 0815  . multivitamin with minerals tablet 1 tablet  1 tablet Oral Daily Ripudeep Krystal Eaton, MD   1 tablet at 04/29/15 0949  . oxyCODONE (Oxy IR/ROXICODONE) immediate release tablet 15 mg  15 mg Oral Q6H PRN Roswell Nickel, MD   15 mg at 04/29/15 0029  . pantoprazole (PROTONIX) EC tablet 40 mg  40 mg Oral Daily Roswell Nickel, MD   40 mg at 04/29/15 0951  .  polyethylene glycol (MIRALAX / GLYCOLAX) packet 17 g  17 g Oral Daily Ripudeep Krystal Eaton, MD   17 g at 04/29/15 0947  . pregabalin (LYRICA) capsule 25 mg  25 mg Oral BID Roswell Nickel, MD   25 mg at 04/29/15 0949  . promethazine-codeine (PHENERGAN with CODEINE) 6.25-10 MG/5ML syrup 5 mL  5 mL Oral Q6H PRN Roswell Nickel, MD   5 mL at 04/29/15 0747  . QUEtiapine (SEROQUEL) tablet 100 mg  100 mg Oral BID Roswell Nickel, MD   100 mg at 04/29/15 0949  . thiamine (VITAMIN B-1) tablet 100 mg  100 mg Oral Daily Hosie Poisson, MD   100 mg at 04/29/15 0949  . zolpidem (AMBIEN) tablet 5 mg  5 mg Oral QHS PRN Roswell Nickel, MD   5 mg at 04/29/15 0049    Musculoskeletal: Strength & Muscle Tone: decreased Gait & Station: unable to stand Patient leans: N/A  Psychiatric Specialty Exam: Physical Exam as per history and physical   ROS:   Blood pressure 108/77, pulse 96, temperature 98.6 F (37 C), temperature source Oral, resp. rate 18, height 5' 3" (1.6 m), weight 92.307 kg (203 lb 8 oz), last menstrual period 04/16/2015, SpO2 100 %.Body mass index is 36.06 kg/(m^2).  General Appearance: Guarded  Eye Contact::  Good  Speech:  Clear and Coherent and Slow  Volume:  Decreased  Mood:  Anxious, Dysphoric and Worthless  Affect:  Constricted, Depressed and Tearful  Thought Process:  Coherent and Goal Directed  Orientation:  Full (Time, Place, and Person)  Thought Content:  WDL  Suicidal Thoughts:  No  Homicidal Thoughts:  No  Memory:  Immediate;   Fair Recent;   Fair  Judgement:  Intact  Insight:  Fair  Psychomotor Activity:  Decreased  Concentration:  Good  Recall:  Good  Fund of Knowledge:Good  Language: Good  Akathisia:  Negative  Handed:  Right  AIMS (if indicated):     Assets:  Communication Skills Desire for Improvement Housing Leisure Time Resilience Social Support  ADL's:  Intact  Cognition: WNL  Sleep:      Medical Decision Making: Review of Psycho-Social Stressors (1), Review  or order clinical lab tests (1), Established Problem, Worsening (2), Review of Last Therapy Session (1), Review or order medicine tests (1), Review of Medication Regimen & Side Effects (2) and Review of New Medication or Change in Dosage (2)  Treatment Plan Summary: Patient continued to be depression, tearful, anxiety and history of substance abuse. Patient contract for safety while in the hospital. Daily contact with patient to assess and evaluate symptoms and progress in treatment and Medication management  Plan:  Spoke with psychiatric social service who will contact the patient regarding psychosocial stressor and possible referral for better housing  Continue safety sitter secondary to fall risk  Increase Fluoxetine 40 mg PO Qam for depressipn Continue Alprazolam 1 mg TID for panic disorder Continue Seroquel 100 mg at bedtime for mood and insomnia  Patient does not meet criteria for psychiatric inpatient admission. Supportive therapy provided about ongoing stressors.  Appreciate psychiatric consultation follow-up as clinically needed  Please contact [phone] or [phone] if needs further assistance  Disposition: Patient will be referred to the outpatient medication management and counseling services and medically stable.  ,JANARDHAHA R. 04/29/2015 10:39 AM
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
## 2015-04-29 NOTE — Progress Notes (Signed)
[facility]                                                                              Patient Demographics  Julie Kerr, is a 39 y.o. female, DOB - 22-Sep-1975, ZOX:[id number]  Admit date - 04/24/2015   Admitting Physician [name], MD  Outpatient Primary MD for the patient is No primary care provider on file.  LOS - 4   Chief Complaint  Patient presents with  . Respiratory Distress       Brief HPI   Patient is [name] PMHx Anxiety, Asthma, COPD, bronchitis, HTN, Heart murmur, Diabetes Type 2 , S/P Spinal Fusion and Tracheostomy, Alcoholic abuse,VCD. Patient presented to ED after 3 days of progressive wheezing and SOB not responsive to her usually inhalers and nebulized rescue medication. In the ED at [facility] she was given solumedrol, duonebs and was discharged home. She then returned to the [facility] ED with symptoms that were persistent if not worse. She denied any recent sick contacts and/or fevers, chills. She complained of dyspnea and cough present for 3 days. She also reported that her home is full of mold and this is a frequent trigger for her asthma    Assessment & Plan    Principal Problem:   Acute on chronic respiratory failure with hypoxia, committed acquired pneumonia with vocal cord dysfunction, acute on chronic COPD exacerbation - Continue Brovana, Flonase, Pulmicort, IV Solu-Medrol, duo nebs, IV Levaquin - patient continues to have wheezing/ pseudowheezing, called pulm consult, well known to them - strong functional component with underlying anxiety issues, called psych consult      Active Problems:   Acute on Chronic pain syndrome with significant anxiety component - Patient complaining of pleuritic chest pain and abdominal spasms, continue MS Contin, oxycodone, IV morphine as needed    Vocal cord dysfunction - As #1    Generalized anxiety disorder - Continue Seroquel, Ativan as needed - Patient is on significant amount of Xanax  at home, restart 1mg  TID to avoid withdrawals, called psych consult    Polysubstance abuse, alcohol abuse Currently on CIWA protocol, cont ativan    Mechanical fall overnight - All the x-rays left shoulder, hip, spine x-rays all negative  Code Status: full   Family Communication: Discussed in detail with the patient, all imaging results, lab results explained to the patient    Disposition Plan: not medically ready, DC tomorrow if okay, awaiting psych consult  Time Spent in minutes  25 minutes  Procedures  CXR   Consults   None   DVT Prophylaxis  Lovenox   Medications  Scheduled Meds: . ALPRAZolam  1 mg Oral TID  . arformoterol  15 mcg Nebulization BID  . azelastine  2 spray Each Nare BID  . budesonide  0.5 mg Nebulization BID  . docusate sodium  100 mg Oral BID  . enoxaparin (LOVENOX) injection  40 mg Subcutaneous Q24H  . FLUoxetine  20 mg Oral Daily  . fluticasone  2 spray Each Nare BID  . folic acid  1 mg Oral Daily  . insulin aspart  0-20 Units Subcutaneous TID WC  .  insulin glargine  14 Units Subcutaneous Daily  . ipratropium-albuterol  3 mL Nebulization Q6H  . levofloxacin (LEVAQUIN) IV  750 mg Intravenous Q24H  . morphine  30 mg Oral Q12H  . multivitamin with minerals  1 tablet Oral Daily  . pantoprazole  40 mg Oral Daily  . polyethylene glycol  17 g Oral Daily  . [START ON 04/30/2015] predniSONE  40 mg Oral Q breakfast  . pregabalin  25 mg Oral BID  . QUEtiapine  100 mg Oral BID  . thiamine  100 mg Oral Daily   Continuous Infusions:  PRN Meds:.sodium chloride, LORazepam **OR** LORazepam, morphine injection, oxyCODONE, promethazine-codeine, zolpidem   Antibiotics   Anti-infectives    Start     Dose/Rate Route Frequency Ordered Stop   04/25/15 1000  azithromycin (ZITHROMAX) tablet 500 mg  Status:  Discontinued     500 mg Oral Daily 04/25/15 0015 04/25/15 0312   04/25/15 0315  cefTRIAXone (ROCEPHIN) 1 g in dextrose 5 % 50 mL IVPB  Status:  Discontinued      1 g 100 mL/hr over 30 Minutes Intravenous Every 24 hours 04/25/15 0312 04/26/15 1027   04/25/15 0315  levofloxacin (LEVAQUIN) IVPB 750 mg     750 mg 100 mL/hr over 90 Minutes Intravenous Every 24 hours 04/25/15 1610          Subjective:   Julie Kerr was seen and examined today. Dramatically anxious and pseudo wheezing while in room, was eating breakfast comfortable prior to me entering the room. Overnight issues noted. Complaining of generalized pain. No nausea, vomiting, diarrhea or new weakness.   Objective:   Blood pressure 126/59, pulse 89, temperature 98.3 F (36.8 C), temperature source Oral, resp. rate 18, height 5\' 3"  (1.6 m), weight 92.307 kg (203 lb 8 oz), last menstrual period 04/16/2015, SpO2 99 %.  Wt Readings from Last 3 Encounters:  04/29/15 92.307 kg (203 lb 8 oz)  03/28/15 83.8 kg (184 lb 11.9 oz)  01/19/15 81.647 kg (180 lb)     Intake/Output Summary (Last 24 hours) at 04/29/15 1248 Last data filed at 04/29/15 0659  Gross per 24 hour  Intake   1550 ml  Output   4550 ml  Net  -3000 ml    Exam  General: Alert and oriented, crying and anxious  HEENT:  PERRLA, EOMI, Anicteric Sclera, mucous membranes moist.   Neck: Supple, no JVD, no masses  CVS: S1 S2 auscultated, no rubs, murmurs or gallops. Regular rate and rhythm.  Respiratory: No significant wheezing  Abdomen: Soft, nondistended, + bowel sounds  Ext: no cyanosis clubbing or edema  Neuro: no new deficits  Skin: No rashes  Psych: dramatically anxious and crying through the encounter   Data Review   Micro Results Recent Results (from the past 240 hour(s))  Respiratory virus panel     Status: None   Collection Time: 04/25/15  2:01 AM  Result Value Ref Range Status   Respiratory Syncytial Virus A Negative Negative Final   Respiratory Syncytial Virus B Negative Negative Final   Influenza A Negative Negative Final   Influenza B Negative Negative Final   Parainfluenza 1 Negative  Negative Final   Parainfluenza 2 Negative Negative Final   Parainfluenza 3 Negative Negative Final   Metapneumovirus Negative Negative Final   Rhinovirus Negative Negative Final   Adenovirus Negative Negative Final    Comment: (NOTE) Performed At: [facility] [address] [place], [place] [id number] Mila Homer MD JX:[id number]   Bordetella  pertussis PCR     Status: None   Collection Time: 04/25/15  2:01 AM  Result Value Ref Range Status   B pertussis, DNA Negative Negative Final   B parapertussis, DNA Negative Negative Final    Comment: (NOTE) This test was developed and its performance characteristics determined by [employer]. It has not been cleared or approved by the U.S. Food and Drug Administration. The FDA has determined that such clearance or approval is not necessary. This test is used for clinical purposes. It should not be regarded as investigational or research. Performed At: [facility] [address] [place], [place] [id number] Mila Homer MD [id number]   MRSA PCR Screening     Status: None   Collection Time: 04/25/15  2:48 AM  Result Value Ref Range Status   MRSA by PCR NEGATIVE NEGATIVE Final    Comment:        The GeneXpert MRSA Assay (FDA approved for NASAL specimens only), is one component of a comprehensive MRSA colonization surveillance program. It is not intended to diagnose MRSA infection nor to guide or monitor treatment for MRSA infections.   Culture, blood (routine x 2)     Status: None (Preliminary result)   Collection Time: 04/25/15  4:20 AM  Result Value Ref Range Status   Specimen Description BLOOD LEFT HAND  Final   Special Requests BOTTLES DRAWN AEROBIC ONLY 10CC  Final   Culture NO GROWTH 3 DAYS  Final   Report Status PENDING  Incomplete  Culture, blood (routine x 2)     Status: None (Preliminary result)   Collection Time: 04/25/15  4:35 AM  Result Value Ref Range Status   Specimen  Description BLOOD RIGHT ANTECUBITAL  Final   Special Requests BOTTLES DRAWN AEROBIC AND ANAEROBIC 10CC  Final   Culture NO GROWTH 3 DAYS  Final   Report Status PENDING  Incomplete  Culture, expectorated sputum-assessment     Status: None   Collection Time: 04/28/15 10:42 AM  Result Value Ref Range Status   Specimen Description EXPECTORATED SPUTUM  Final   Special Requests NONE  Final   Sputum evaluation   Final    THIS SPECIMEN IS ACCEPTABLE. RESPIRATORY CULTURE REPORT TO FOLLOW.   Report Status 04/28/2015 FINAL  Final  Culture, respiratory (NON-Expectorated)     Status: None (Preliminary result)   Collection Time: 04/28/15 10:42 AM  Result Value Ref Range Status   Specimen Description SPUTUM  Final   Special Requests NONE  Final   Gram Stain   Final    RARE WBC PRESENT, PREDOMINANTLY PMN RARE SQUAMOUS EPITHELIAL CELLS PRESENT FEW GRAM POSITIVE COCCI IN PAIRS IN CHAINS RARE GRAM POSITIVE RODS Performed at [facility]    Culture   Final    Culture reincubated for better growth Performed at [facility]    Report Status PENDING  Incomplete    Radiology Reports Dg Chest 2 View  04/24/2015   CLINICAL DATA:  Cough and wheezing.  Painful respirations  EXAM: CHEST  2 VIEW  COMPARISON:  03/25/2015  FINDINGS: The heart size and mediastinal contours are within normal limits. Both lungs are clear. The visualized skeletal structures are unremarkable.  IMPRESSION: No active cardiopulmonary disease.   Electronically Signed   By: Signa Kell M.D.   On: 04/24/2015 18:23   Dg Lumbar Spine Complete  04/28/2015   CLINICAL DATA:  Fall from bed tonight.  Now with low back pain.  EXAM: LUMBAR SPINE - COMPLETE 4+  VIEW  COMPARISON:  02/11/2014  FINDINGS: History fusion L5-S1, unchanged in appearance. There is an interbody spacer. Two right S1 screws are again seen, unchanged. The L5 screws about the superior endplate of the vertebral body. No hardware complication. The alignment is  maintained. Vertebral body heights are normal. There is no listhesis. The posterior elements are intact. Disc spaces are preserved. No fracture. Sacroiliac joints are symmetric and normal.  IMPRESSION: 1. No acute fracture or subluxation. 2. L5-S1 posterior fusion, unchanged in appearance.   Electronically Signed   By: Rubye Oaks M.D.   On: 04/28/2015 23:56   Dg Chest Port 1 View  04/27/2015   CLINICAL DATA:  Shortness of breath and cough  EXAM: PORTABLE CHEST - 1 VIEW  COMPARISON:  04/24/2015  FINDINGS: Normal heart size and mediastinal contours. Hypoventilation. No acute infiltrate or edema. No effusion or pneumothorax. No acute osseous findings.  IMPRESSION: No active disease.   Electronically Signed   By: Marnee Spring M.D.   On: 04/27/2015 08:09   Dg Chest Portable 1 View  04/24/2015   CLINICAL DATA:  Labored breathing with audible wheezes and stridor.  EXAM: PORTABLE CHEST - 1 VIEW  COMPARISON:  04/24/2015  FINDINGS: Shallow inspiration. Normal heart size and pulmonary vascularity. There is increasing interstitial infiltration in the right lung base suggesting early pneumonia. No blunting of costophrenic angles. No pneumothorax. Mediastinal contours appear intact.  IMPRESSION: Developing interstitial infiltration in the right lung base suggesting pneumonia.   Electronically Signed   By: Burman Nieves M.D.   On: 04/24/2015 23:36   Dg Shoulder Left  04/28/2015   CLINICAL DATA:  39 year old female with fall and left shoulder pain  EXAM: LEFT SHOULDER - 2+ VIEW  COMPARISON:  None.  FINDINGS: There is no evidence of fracture or dislocation. There is no evidence of arthropathy or other focal bone abnormality. Soft tissues are unremarkable.  IMPRESSION: Negative.   Electronically Signed   By: Elgie Collard M.D.   On: 04/28/2015 23:54   Dg Hip Unilat With Pelvis 2-3 Views Left  04/28/2015   CLINICAL DATA:  39 year old female with fall and left lateral pelvic pain  EXAM: DG HIP (WITH OR WITHOUT  PELVIS) 2-3V LEFT  COMPARISON:  CT of the abdomen and pelvis dated 11/26/2014  FINDINGS: There is no acute fracture or dislocation. Partially visualized lower lumbar fixation screws noted. The soft tissues are grossly unremarkable.  IMPRESSION: No acute fracture or dislocation.   Electronically Signed   By: Elgie Collard M.D.   On: 04/28/2015 23:54    CBC  Recent Labs Lab 04/24/15 2233 04/25/15 0635 04/26/15 0715 04/27/15 0512 04/29/15 0544  WBC 16.4* 17.8* 27.0* 21.3* 19.5*  HGB 11.1* 9.9* 9.6* 9.9* 10.0*  HCT 35.0* 31.4* 30.8* 31.9* 31.1*  PLT 417* 337 357 346 339  MCV 80.3 78.3 79.8 80.4 80.4  MCH 25.5* 24.7* 24.9* 24.9* 25.8*  MCHC 31.7 31.5 31.2 31.0 32.2  RDW 15.1 14.9 15.7* 15.6* 15.4  LYMPHSABS  --   --   --  1.1  --   MONOABS  --   --   --  0.9  --   EOSABS  --   --   --  0.0  --   BASOSABS  --   --   --  0.0  --     Chemistries   Recent Labs Lab 04/24/15 2233 04/25/15 1610  04/26/15 0715  04/27/15 0512  04/27/15 1704 04/27/15 2100 04/28/15 0121 04/28/15 0521 04/28/15  0915 04/29/15 0544  NA 136 137  --  137  --  138  --   --   --   --   --   --  135  K 3.7 4.4  --  4.4  --  4.3  --   --   --   --   --   --  4.2  CL 105 109  --  107  --  105  --   --   --   --   --   --  96*  CO2 17* 18*  --  22  --  26  --   --   --   --   --   --  28  GLUCOSE 311* 190*  --  162*  --  153*  --   --   --   --   --   --  207*  BUN 10 8  --  10  --  12  --   --   --   --   --   --  18  CREATININE 0.90 0.80  --  0.84  --  0.79  --   --   --   --   --   --  0.88  CALCIUM 9.6 9.2  --  9.4  --  9.3  --   --   --   --   --   --  9.0  MG 2.2 2.6*  < > 1.9  < > 1.7  < > 1.8 1.8 2.0 2.0 2.0  --   AST  --   --   --   --   --  33  --   --   --   --   --   --   --   ALT  --   --   --   --   --  27  --   --   --   --   --   --   --   ALKPHOS  --   --   --   --   --  56  --   --   --   --   --   --   --   BILITOT  --   --   --   --   --  <0.1*  --   --   --   --   --   --   --   < > =  values in this interval not displayed. ------------------------------------------------------------------------------------------------------------------ estimated creatinine clearance is 94.5 mL/min (by C-G formula based on Cr of 0.88). ------------------------------------------------------------------------------------------------------------------  Recent Labs  04/27/15 0058  HGBA1C 5.9*   ------------------------------------------------------------------------------------------------------------------ No results for input(s): CHOL, HDL, LDLCALC, TRIG, CHOLHDL, LDLDIRECT in the last 72 hours. ------------------------------------------------------------------------------------------------------------------ No results for input(s): TSH, T4TOTAL, T3FREE, THYROIDAB in the last 72 hours.  Invalid input(s): FREET3 ------------------------------------------------------------------------------------------------------------------ No results for input(s): VITAMINB12, FOLATE, FERRITIN, TIBC, IRON, RETICCTPCT in the last 72 hours.  Coagulation profile No results for input(s): INR, PROTIME in the last 168 hours.  No results for input(s): DDIMER in the last 72 hours.  Cardiac Enzymes No results for input(s): CKMB, TROPONINI, MYOGLOBIN in the last 168 hours.  Invalid input(s): CK ------------------------------------------------------------------------------------------------------------------ Invalid input(s): POCBNP   Recent Labs  04/28/15 0800 04/28/15 1152 04/28/15 1728 04/28/15 2209 04/29/15 0813 04/29/15 1222  GLUCAP 110* 146* 190* 186* 210* 156*     RAI,RIPUDEEP M.D. [facility] 04/29/2015,  12:48 PM  Pager: [phone] Between 7am to 7pm - call Pager - [phone]  After 7pm go to [email or web address] - password [email or web address]  Call night coverage person covering after 7pm

## 2015-04-30 ENCOUNTER — Telehealth: Payer: Self-pay | Admitting: Internal Medicine

## 2015-04-30 LAB — CULTURE, RESPIRATORY: CULTURE: NORMAL

## 2015-04-30 LAB — BASIC METABOLIC PANEL
ANION GAP: 10 (ref 5–15)
BUN: 23 mg/dL — ABNORMAL HIGH (ref 6–20)
CALCIUM: 8.8 mg/dL — AB (ref 8.9–10.3)
CO2: 30 mmol/L (ref 22–32)
Chloride: 95 mmol/L — ABNORMAL LOW (ref 101–111)
Creatinine, Ser: 1 mg/dL (ref 0.44–1.00)
GLUCOSE: 107 mg/dL — AB (ref 65–99)
POTASSIUM: 3.9 mmol/L (ref 3.5–5.1)
SODIUM: 135 mmol/L (ref 135–145)

## 2015-04-30 LAB — CULTURE, BLOOD (ROUTINE X 2)
CULTURE: NO GROWTH
Culture: NO GROWTH

## 2015-04-30 LAB — GLUCOSE, CAPILLARY
GLUCOSE-CAPILLARY: 95 mg/dL (ref 65–99)
GLUCOSE-CAPILLARY: 162 mg/dL — AB (ref 65–99)

## 2015-04-30 MED ORDER — PREDNISONE 10 MG PO TABS: ORAL_TABLET | ORAL | Status: DC

## 2015-04-30 MED ORDER — AZELASTINE HCL 0.1 % NA SOLN
2.0000 | Freq: Two times a day (BID) | NASAL | Status: DC
Start: 2015-04-30 — End: 2015-09-01

## 2015-04-30 MED ORDER — LEVOFLOXACIN 750 MG PO TABS: 750.0000 mg | ORAL_TABLET | Freq: Every day | ORAL | Status: DC

## 2015-04-30 MED ORDER — FLUOXETINE HCL 40 MG PO CAPS: 40.0000 mg | ORAL_CAPSULE | Freq: Every day | ORAL | Status: DC

## 2015-04-30 MED ORDER — ALPRAZOLAM 2 MG PO TABS: 1.0000 mg | ORAL_TABLET | Freq: Three times a day (TID) | ORAL | Status: DC

## 2015-04-30 MED ORDER — QUETIAPINE FUMARATE 100 MG PO TABS: 100.0000 mg | ORAL_TABLET | Freq: Every day | ORAL | Status: DC

## 2015-04-30 MED ORDER — FLUTICASONE PROPIONATE 50 MCG/ACT NA SUSP
2.0000 | Freq: Two times a day (BID) | NASAL | Status: DC
Start: 2015-04-30 — End: 2015-12-02

## 2015-04-30 MED ORDER — MORPHINE SULFATE ER 30 MG PO TBCR: 30.0000 mg | EXTENDED_RELEASE_TABLET | Freq: Two times a day (BID) | ORAL | Status: DC

## 2015-04-30 MED ORDER — DOCUSATE SODIUM 100 MG PO CAPS: 100.0000 mg | ORAL_CAPSULE | Freq: Two times a day (BID) | ORAL | Status: DC

## 2015-04-30 MED ORDER — FLUOXETINE HCL 20 MG PO CAPS
40.0000 mg | ORAL_CAPSULE | Freq: Every day | ORAL | Status: DC
  Administered 2015-04-30: 40 mg via ORAL
  Filled 2015-04-30: qty 2

## 2015-04-30 MED ORDER — PREGABALIN 25 MG PO CAPS: 25.0000 mg | ORAL_CAPSULE | Freq: Two times a day (BID) | ORAL | Status: DC

## 2015-04-30 MED ORDER — OXYCODONE HCL 15 MG PO TABS: 15.0000 mg | ORAL_TABLET | Freq: Four times a day (QID) | ORAL | Status: DC | PRN

## 2015-04-30 MED ORDER — POLYETHYLENE GLYCOL 3350 17 G PO PACK: 17.0000 g | PACK | Freq: Every day | ORAL | Status: DC

## 2015-04-30 MED ORDER — PROMETHAZINE-CODEINE 6.25-10 MG/5ML PO SYRP: 5.0000 mL | ORAL_SOLUTION | Freq: Two times a day (BID) | ORAL | Status: DC | PRN

## 2015-04-30 MED ORDER — ARFORMOTEROL TARTRATE 15 MCG/2ML IN NEBU: 15.0000 ug | INHALATION_SOLUTION | Freq: Two times a day (BID) | RESPIRATORY_TRACT | Status: DC

## 2015-04-30 MED ORDER — ZOLPIDEM TARTRATE 10 MG PO TABS: 10.0000 mg | ORAL_TABLET | Freq: Every evening | ORAL | Status: DC | PRN

## 2015-04-30 MED ORDER — QUETIAPINE FUMARATE 25 MG PO TABS: 100.0000 mg | ORAL_TABLET | Freq: Every day | ORAL | Status: DC

## 2015-04-30 NOTE — ED Notes (Signed)
Chart opened to document wasted medication. Charge RN Autumn and this RN attempted to waste medication from pyxis however, patient was no longer in pyxis. A total of 2 of ativan was wasted in sink.

## 2015-04-30 NOTE — Progress Notes (Signed)
Pt left in wc 

## 2015-04-30 NOTE — Hospital Discharge Follow-Up (Signed)
Transitional Care Clinic Care Coordination Note:  Admit date:  04/24/15 Discharge date: 04/30/15 Discharge Disposition: Home with [facility] RN/PT/SW/HHA with [facility]) Patient contact: [phone] Emergency contact(s): [phone]  This Case Manager reviewed patient's EMR and determined patient would benefit from post-discharge medical management and chronic care management services through the [facility]. Patient has a history of anxiety, asthma, COPD, HTN, type 2 diabetes mellitus. Admitted for 3 day history of progressive wheezing, shortness of breath.  Patient has had 4 inpatient admissions and 2 ED visits in the last 6 months. This Case Manager met with patient to discuss the services and medical management that can be provided at the [facility]. Patient verbalized understanding and agreed to receive post-discharge care at the [facility].   Patient scheduled for Transitional Care appointment on 05/08/15 at 0900 with Dr. Jarold Song.  Clinic information and appointment time provided to patient. Appointment information also placed on AVS.  Assessment:       Home Environment: Patient lives with her two children and grandchild in a private residence.       Support System: family members       Level of functioning:  Patient indicates she needs some assistance with daily activities. She indicates she has a home health aide through [facility] that assists patient with cooking meals, transportation, laundry, and house cleaning.       Home DME: bedside commode, shower chair, nebulizer, walker, home oxygen. Patient on continuous oxygen at 2L per nasal cannula. [facility] is oxygen supplier.       Home care services: Home health services (RN/PT/SW/HHA) have been arranged with [facility].  Patient also indicates she has a home health aide with [facility].       Transportation: Patient indicates her home health aide with [facility]  takes her to her medical appointments.        Food/Nutrition: Patient indicates she receives $230 of Food Stamps/month.  She indicates she often does not have enough food. Informed patient that [facility] may be able to assist. Patient plans to call [facility] after discharge.  Patient may need to meet with Vesta Mixer, SW at [facility] at her appointment for additional food pantry resources.  Communication sent to [facility], SW.        Medications: Patient typically gets her medications from [facility] but sometimes has difficulty affording her medication copays.  Informed patient of pharmacy resources available at [place], and patient verbalized understanding.        Identified Barriers: Difficulty affording medication copays, needs additional food/nutrition resources, lack of PCP.        PCP: none. Patient does not currently have a PCP.  Patient will likely need to establish care at [place] after 30 days of medical management with [facility]. Patient plans to see Dr. Lake Bells for Pulmonology.             Arranged services:        Services communicated to Carles Collet, RN CM

## 2015-04-30 NOTE — Telephone Encounter (Signed)
Left message for [name] to call back

## 2015-04-30 NOTE — Care Management Note (Addendum)
Case Management Note  Patient Details  Name: Julie Kerr MRN: [id number] Date of Birth: 03-10-1976  Subjective/Objective:                 Discharge to home, walker and 3 in 1 to be brought to room prior to discharge, spoke with [facility], patient to receive [facility] RN SW PT HHA, report given. Patient approved for [facility] through Dr Estanislado Emms CMS notified. Appointment made at [facility] Monday 19th at 3pm patient will be referred to transitional care clinic there, entered into AVS.  Will follow up with Dr Grayland Ormond for pulmonology.   Action/Plan:  No further needs at this time, patient updated on plan.   Expected Discharge Date:                  Expected Discharge Plan:  Home w Home Health Services  In-House Referral:     Discharge planning Services  CM Consult, Indigent Health Clinic  Post Acute Care Choice:  Home Health, Durable Medical Equipment Choice offered to:  Patient  DME Arranged:    DME Agency:     HH Arranged:  RN, PT, Nurse's Aide (Child psychotherapist) HH Agency:  [facility] ([facility])  Status of Service:  In process, will continue to follow  Medicare Important Message Given:  Yes-second notification given Date Medicare IM Given:    Medicare IM give by:    Date Additional Medicare IM Given:    Additional Medicare Important Message give by:     If discussed at Long Length of Stay Meetings, dates discussed:    Additional Comments:  Lawerance Sabal, RN 04/30/2015, 10:58 AM

## 2015-04-30 NOTE — Evaluation (Signed)
Physical Therapy Evaluation Patient Details Name: Julie Kerr MRN: [id number] DOB: 1976/05/23 Today's Date: 04/30/2015   History of Present Illness  39 y.o. female admitted with acute on chronic respiratory failure with hypoxia, community acquired pneumonia with vocal cord dysfunction, acute on chronic COPD exacerbation  Clinical Impression  Pt admitted with the above complications. Pt currently with functional limitations due to the deficits listed below (see PT Problem List). Ambulates slowly with several standing rest breaks due to back pain which she states is typical. SpO2 98% and greater on 1L supplemental O2 while ambulating, HR 110. Reports occasional falls at home due to LE fatigue and would therefore benefit from rollator and HHPT for follow-up with home assessment. Has and aide already that assists daily. Will follow until discharge.       Follow Up Recommendations Home health PT;Supervision - Intermittent    Equipment Recommendations  Other (comment) (Rollator)    Recommendations for Other Services       Precautions / Restrictions Precautions Precautions: None Restrictions Weight Bearing Restrictions: No      Mobility  Bed Mobility               General bed mobility comments: sitting in chair  Transfers Overall transfer level: Modified independent Equipment used: Rolling walker (2 wheeled)             General transfer comment: extra time no assist  Ambulation/Gait Ambulation/Gait assistance: Supervision Ambulation Distance (Feet): 70 Feet Assistive device: Rolling walker (2 wheeled) Gait Pattern/deviations: Step-through pattern;Decreased stride length;Trunk flexed Gait velocity: slow Gait velocity interpretation: Below normal speed for age/gender General Gait Details: Requires several standing rest breaks due to reported back pain. Leans over RW to reset. Cues for upright posture and reasoning behind erect posture when ambulating due to history of  back surgery. SpO2 maintained >98% on 1L supplemental O2 while ambulating. No buckling or loss of balance noted. Cues for pursed lip breathing and energy conservation techniques.  Stairs Stairs:  (Declines at this time. )          Wheelchair Mobility    Modified Rankin (Stroke Patients Only)       Balance Overall balance assessment: Needs assistance;History of Falls Sitting-balance support: No upper extremity supported;Feet supported Sitting balance-Leahy Scale: Good     Standing balance support: No upper extremity supported Standing balance-Leahy Scale: Fair                               Pertinent Vitals/Pain Pain Assessment: 0-10 Pain Score: 9  Pain Location: back Pain Descriptors / Indicators:  ("killing me") Pain Intervention(s): Monitored during session;Repositioned;Patient requesting pain meds-RN notified;Limited activity within patient's tolerance    Home Living Family/patient expects to be discharged to:: Private residence Living Arrangements: Children Available Help at Discharge: Family;Personal care attendant Type of Home: House Home Access: Stairs to enter Entrance Stairs-Rails: None Entrance Stairs-Number of Steps: 5 Home Layout: One level Home Equipment: Environmental consultant - 2 wheels;Shower seat;Bedside commode      Prior Function Level of Independence: Needs assistance   Gait / Transfers Assistance Needed: Ambulates with RW at baseline  ADL's / Homemaking Assistance Needed: Pt reports her aide comes 7 days/week to assist with ADLs and IADLs.   Comments: Reports falls occasionally because her LEs buckle     Hand Dominance   Dominant Hand: Right    Extremity/Trunk Assessment   Upper Extremity Assessment: Defer to OT evaluation  Lower Extremity Assessment: Overall WFL for tasks assessed         Communication   Communication: No difficulties  Cognition Arousal/Alertness: Awake/alert Behavior During Therapy: WFL for tasks  assessed/performed Overall Cognitive Status: Within Functional Limits for tasks assessed                      General Comments General comments (skin integrity, edema, etc.): Reports back pain with mobility at baseline.    Exercises        Assessment/Plan    PT Assessment Patient needs continued PT services  PT Diagnosis Abnormality of gait;Acute pain   PT Problem List Decreased activity tolerance;Decreased balance;Decreased mobility;Decreased knowledge of use of DME;Obesity;Pain  PT Treatment Interventions DME instruction;Gait training;Stair training;Functional mobility training;Therapeutic activities;Therapeutic exercise;Balance training;Patient/family education   PT Goals (Current goals can be found in the Care Plan section) Acute Rehab PT Goals Patient Stated Goal: none stated PT Goal Formulation: With patient Time For Goal Achievement: 05/14/15 Potential to Achieve Goals: Good    Frequency Min 3X/week   Barriers to discharge Decreased caregiver support Has aide 2hr/day and daughter comes to visit during the week    Co-evaluation               End of Session Equipment Utilized During Treatment: Gait belt;Oxygen Activity Tolerance: Patient limited by pain Patient left: in chair;with call bell/phone within reach Nurse Communication: Mobility status;Patient requests pain meds         Time: 1610-9604 PT Time Calculation (min) (ACUTE ONLY): 25 min   Charges:   PT Evaluation $Initial PT Evaluation Tier I: 1 Procedure PT Treatments $Gait Training: 8-22 mins   PT G CodesBerton Mount 04/30/2015, 2:02 PM Sunday Spillers [place],  [phone]

## 2015-04-30 NOTE — Discharge Summary (Signed)
Physician Discharge Summary   Patient ID: Julie Kerr MRN: [id number] DOB/AGE: 03/27/1976 39 y.o.  Admit date: 04/24/2015 Discharge date: 04/30/2015  Primary Care Physician:  No primary care provider on file.  Discharge Diagnoses:   . Acute on chronic respiratory failure with hypoxia . Vocal cord dysfunction . CAP (community acquired pneumonia) . Acute and chronic Generalized anxiety disorder . Alcohol abuse . Depression . Chronic pain syndrome . Polysubstance abuse . Acute on chronic respiratory failure with hypoxia  Consults: Pulmonology, Dr. Kendrick Kerr Psychiatry, Dr. Sheryle Kerr   Recommendations for Outpatient Follow-up:  [name] for psychiatry follow-up for 9/21, Wednesday 11am.   Pulmonology appointment scheduled with Dr. Sherene Sires on 9/22  TESTS THAT NEED FOLLOW-UP None    DIET: Heart healthy diet    Allergies:   Allergies  Allergen Reactions  . Robitussin Dm [Dextromethorphan-Guaifenesin] Nausea And Vomiting  . Chocolate Hives  . Nsaids Hives  . Rayon, Purified Hives  . Tramadol Hives     Discharge Medications:   Medication List    TAKE these medications        PROAIR HFA 108 (90 BASE) MCG/ACT inhaler  Generic drug:  albuterol  Inhale 2 puffs into the lungs every 6 (six) hours as needed for wheezing or shortness of breath.     albuterol (2.5 MG/3ML) 0.083% nebulizer solution  Commonly known as:  PROVENTIL  Take 3 mLs by nebulization every 2 (two) hours as needed for wheezing or shortness of breath.     alprazolam 2 MG tablet  Commonly known as:  XANAX  Take 0.5 tablets (1 mg total) by mouth 3 (three) times daily.     arformoterol 15 MCG/2ML Nebu  Commonly known as:  BROVANA  Take 2 mLs (15 mcg total) by nebulization 2 (two) times daily.     azelastine 0.1 % nasal Kerr  Commonly known as:  ASTELIN  Place 2 sprays into both nostrils 2 (two) times daily. Use in each nostril as directed     budesonide 0.5 MG/2ML nebulizer solution   Commonly known as:  PULMICORT  Take 2 mLs (0.5 mg total) by nebulization 2 (two) times daily.     docusate sodium 100 MG capsule  Commonly known as:  COLACE  Take 1 capsule (100 mg total) by mouth 2 (two) times daily.     ENSURE  Take 237 mLs by mouth 3 (three) times daily between meals.     FLUoxetine 40 MG capsule  Commonly known as:  PROZAC  Take 1 capsule (40 mg total) by mouth daily.     fluticasone 50 MCG/ACT nasal Kerr  Commonly known as:  FLONASE  Place 2 sprays into both nostrils 2 (two) times daily.     ipratropium-albuterol 0.5-2.5 (3) MG/3ML Soln  Commonly known as:  DUONEB  Take 3 mLs by nebulization every 4 (four) hours as needed (shortness of breath/wheezing).     levofloxacin 750 MG tablet  Commonly known as:  LEVAQUIN  Take 1 tablet (750 mg total) by mouth daily. X 5 days     morphine 30 MG 12 hr tablet  Commonly known as:  MS CONTIN  Take 1 tablet (30 mg total) by mouth every 12 (twelve) hours.     oxyCODONE 15 MG immediate release tablet  Commonly known as:  ROXICODONE  Take 1 tablet (15 mg total) by mouth every 6 (six) hours as needed.     pantoprazole 40 MG tablet  Commonly known as:  PROTONIX  Take 1 tablet (40  mg total) by mouth daily.     polyethylene glycol packet  Commonly known as:  MIRALAX / GLYCOLAX  Take 17 g by mouth daily.     predniSONE 10 MG tablet  Commonly known as:  DELTASONE  Prednisone dosing: Take  Prednisone 40mg  (4 tabs) x 3 days, then taper to 30mg  (3 tabs) x 3 days, then 20mg  (2 tabs) x 3days, then 10mg  (1 tab) x 3days, then OFF.  Dispense:  30 tabs, refills: None     pregabalin 25 MG capsule  Commonly known as:  LYRICA  Take 1 capsule (25 mg total) by mouth 2 (two) times daily.     PRESCRIPTION MEDICATION  Inhale 1 puff into the lungs every 2 (two) hours as needed (shortness of breath/wheezing).     promethazine-codeine 6.25-10 MG/5ML syrup  Commonly known as:  PHENERGAN with CODEINE  Take 5 mLs by mouth every 12  (twelve) hours as needed for cough.     QUEtiapine 100 MG tablet  Commonly known as:  SEROQUEL  Take 1 tablet (100 mg total) by mouth at bedtime.     zolpidem 10 MG tablet  Commonly known as:  AMBIEN  Take 1 tablet (10 mg total) by mouth at bedtime as needed for sleep.         Brief H and P: For complete details please refer to admission H and P, but in brief Patient is [name] PMHx Anxiety, Asthma, COPD, bronchitis, HTN, Heart murmur, Diabetes Type 2 , S/P Spinal Fusion and Tracheostomy, Alcoholic abuse,VCD. Patient presented to ED after 3 days of progressive wheezing and SOB not responsive to her usually inhalers and nebulized rescue medication. In the ED at [facility] she was given solumedrol, duonebs and was discharged home. She then returned [facility] the [facility] ED with symptoms that were persistent if not worse. She denied any recent sick contacts and/or fevers, chills. She complained of dyspnea and cough present for 3 days. She also reported that her home is full of mold and this is a frequent trigger for her asthma  Hospital Course:  Acute on chronic respiratory failure with hypoxia, committed acquired pneumonia with vocal cord dysfunction, acute on chronic COPD exacerbation Patient has a history of asthma and vocal cord dysfunction, she also has strong history of anxiety, depression. She reported her home is full of mold and frequent trigger for her asthma. Patient was admitted and placed on Brovana, Flonase, Pulmicort, IV Solu-Medrol, duo nebs, IV Levaquin. Patient continued to have wheezing/ pseudowheezing, hence pulmonology consult was obtained. Also noticed that patient had strong functional component with underlying anxiety issues, called psych consult. Several times patient was noticed to be completely comfortable eating or talking on the phone however having pseudo wheezing with adventitious sounds if any health care provider in the room. Pe pulmonology, patient has anxiety and poor  coping skills and most likely constipating to the patient's "wheezing and work of breathing"During the examinations. Patient then disclosed multiple home stressors. Psychiatry recommended continuing Xanax 1 mg 3 times a day, started on fluoxetine, Seroquel was decreased at bedtime. Patient has a follow-up appointment with psychiatry scheduled on 9/21 and pulmonology on 9/22.    Acute on Chronic pain syndrome with significant anxiety component - Patient complaining of pleuritic chest pain and abdominal spasms, patient was continued on her home dose of MS Contin and oxycodone. She was strongly recommended to follow-up with pain clinic. She was given prescription for #20 pills of each with no refills.  Vocal cord dysfunction - As #1   Generalized anxiety disorder - Continue Seroquel,Xanax, fluoxetine   Polysubstance abuse, alcohol abuse  patient was placed on CIWA protocol, cont ativan She is currently stable and ready for discharge  Mechanical fall overnightDuring hospitalization - All the x-rays left shoulder, hip, spine x-rays all negative    Day of Discharge BP 111/58 mmHg  Pulse 89  Temp(Src) 98.4 F (36.9 C) (Oral)  Resp 20  Ht 5\' 3"  (1.6 m)  Wt 95.3 kg (210 lb 1.6 oz)  BMI 37.23 kg/m2  SpO2 98%  LMP 04/16/2015  Physical Exam: General: Alert and awake oriented x3 not in any acute distress. HEENT: anicteric sclera, pupils reactive to light and accommodation CVS: S1-S2 clear no murmur rubs or gallops Chest: clear to auscultation bilaterally, no wheezing rales or rhonchi Abdomen: soft nontender, nondistended, normal bowel sounds Extremities: no cyanosis, clubbing or edema noted bilaterally Neuro: Cranial nerves II-XII intact, no focal neurological deficits   The results of significant diagnostics from this hospitalization (including imaging, microbiology, ancillary and laboratory) are listed below for reference.    LAB RESULTS: Basic Metabolic Panel:  Recent  Labs Lab 04/24/15 2233  04/28/15 0915 04/29/15 0544 04/30/15 0558  NA 136  < >  --  135 135  K 3.7  < >  --  4.2 3.9  CL 105  < >  --  96* 95*  CO2 17*  < >  --  28 30  GLUCOSE 311*  < >  --  207* 107*  BUN 10  < >  --  18 23*  CREATININE 0.90  < >  --  0.88 1.00  CALCIUM 9.6  < >  --  9.0 8.8*  MG 2.2  < > 2.0  --   --   PHOS 2.8  --   --   --   --   < > = values in this interval not displayed. Liver Function Tests:  Recent Labs Lab 04/27/15 0512  AST 33  ALT 27  ALKPHOS 56  BILITOT <0.1*  PROT 6.3*  ALBUMIN 3.2*   No results for input(s): LIPASE, AMYLASE in the last 168 hours. No results for input(s): AMMONIA in the last 168 hours. CBC:  Recent Labs Lab 04/27/15 0512 04/29/15 0544  WBC 21.3* 19.5*  NEUTROABS 19.3*  --   HGB 9.9* 10.0*  HCT 31.9* 31.1*  MCV 80.4 80.4  PLT 346 339   Cardiac Enzymes: No results for input(s): CKTOTAL, CKMB, CKMBINDEX, TROPONINI in the last 168 hours. BNP: Invalid input(s): POCBNP CBG:  Recent Labs Lab 04/30/15 0747 04/30/15 1215  GLUCAP 95 162*    Significant Diagnostic Studies:  Dg Chest 2 View  04/24/2015   CLINICAL DATA:  Cough and wheezing.  Painful respirations  EXAM: CHEST  2 VIEW  COMPARISON:  03/25/2015  FINDINGS: The heart size and mediastinal contours are within normal limits. Both lungs are clear. The visualized skeletal structures are unremarkable.  IMPRESSION: No active cardiopulmonary disease.   Electronically Signed   By: Signa Kell M.D.   On: 04/24/2015 18:23   Dg Chest Portable 1 View  04/24/2015   CLINICAL DATA:  Labored breathing with audible wheezes and stridor.  EXAM: PORTABLE CHEST - 1 VIEW  COMPARISON:  04/24/2015  FINDINGS: Shallow inspiration. Normal heart size and pulmonary vascularity. There is increasing interstitial infiltration in the right lung base suggesting early pneumonia. No blunting of costophrenic angles. No pneumothorax. Mediastinal contours appear intact.  IMPRESSION: Developing  interstitial infiltration in the right lung base suggesting pneumonia.   Electronically Signed   By: Burman Nieves M.D.   On: 04/24/2015 23:36    2D ECHO:   Disposition and Follow-up:     Discharge Instructions    Diet - low sodium heart healthy    Complete by:  As directed      Increase activity slowly    Complete by:  As directed             DISPOSITION: Home  DISCHARGE FOLLOW-UP Follow-up Information    Follow up with [facility]     On 05/08/2015.   Why:  Transitional Care Clinic appointment on 05/08/15 at 9:00 am with [name].   Contact information:   [address] [place] [address] [phone]      Follow up with [facility].   Why:  walker and BSC to be delivered to room prior to discharge.    Contact information:   [address] [place] [address] [phone]       Follow up with [facility].   Specialty:  Home Health Services   Why:  [facility] RN HHA PT  SW   Contact information:   [address] [place] [address] [phone]       Follow up with Sandrea Hughs, MD On 05/07/2015.   Specialty:  Pulmonary Disease   Why:  at 2:45PM    Contact information:   [address] [place] [address] [phone]       Follow up with [name] on 05/06/2015.   Why:  Outpatient psychiatric follow-up, medication managment, counseling services   Contact information:   [address] [place]  [phone] Appt Wed 9/21 10:45am       Time spent on Discharge: 35 minutes   Signed:   Jazmynn Pho M.D. [facility] 04/30/2015, 2:08 PM Pager: [phone]

## 2015-04-30 NOTE — ED Notes (Signed)
addended note about  Wasted 2 mg of Ativan. Witness by Mitzi Davenport RN.

## 2015-04-30 NOTE — Progress Notes (Signed)
PIV removed, intact-pt tol well. Disch instr, f/u appt, rx given to pt. Pt states understanding.

## 2015-04-30 NOTE — Progress Notes (Signed)
Patient approved for HRI per Dr Jacky Kindle. [facility] assigned this week. Nia CMA notified.

## 2015-05-01 ENCOUNTER — Telehealth: Payer: Self-pay

## 2015-05-01 NOTE — Telephone Encounter (Signed)
Received call from [facility] Patient scheduled to see Dr. Sherene Sires on 05/07/15 Larita Fife needs patient to have O2 Sats done at [facility] and sent to her  To [facility] for follow up

## 2015-05-01 NOTE — Telephone Encounter (Signed)
Transitional Care Clinic Post-discharge Follow-Up Phone Call:  Date of Discharge: 04/30/15 Principal Discharge Diagnosis(es): Acute on chronic respiratory failure with hypoxia, acute on chronic COPD exacerbation Call Completed: Yes                   With Whom: Patient Interpreter Needed: No               Language/Dialect: English     Please check all that apply:  X  Patient is knowledgeable of his/her condition(s) and/or treatment. X  Patient is caring for self at home; however, patient does have a home health aide with [facility] that assists patient as needed at home.  ? Patient is receiving assist at home from family and/or caregiver. Family and/or caregiver is knowledgeable of patient's condition(s) and/or treatment. X  Patient has home health services arranged. HH SN/PT/SW/HHA services arranged with [facility] while hospitalized. Patient indicates home health services have not yet started.    Medication Reconciliation:  X  Medication list reviewed with patient. X  Patient obtained all discharge medications-No. Patient indicates she has picked up all discharge medications except Phenergan with Codeine cough syrup. She indicates this medication was $15.96, and she was unable to afford medication. This Case Manager spoke with Pharmacist at [facility] pharmacy who indicated they do not have Phenergan with Codeine cough syrup. She also stated that Medicaid does not cover this cough syrup or most other cough syrups.  Patient would have to pay out of pocket cost for this medication. Patient udpated, and patient verbalized understanding.   Activities of Daily Living:  ? Independent X  Needs assist-Patient has home health aide with [facility] who assists patient with daily activities such as cooking meals, transportation, laundry, and house cleaning.  ? [facility]    Community resources in place for patient:  ? None  X  Home Health/Home  DME-HH RN/PT/SW/HHA services with [facility]. Patient has the following home DME:  Bedside commode, shower chair, nebulizer, walker, home oxygen (on continuous oxygen at 2L per nasal cannula). Oxygen supplier is [facility] services. ? Assisted Living ? Support Group          Patient Education:  Called patient to check on status. Patient indicated she was "doing fine." Medication list extensively reviewed. Discussed importance of medication compliance. Reminded patient of Transitional Care Clinic appointment on 05/08/15 at 0900 with [name].  Patient aware of appointment but indicated she may need transportation to her appointment after all.  Patient has Medicare and Medicaid.  Medicaid transportation discussed. Patient indicated she had never utilized Medicaid transportation before.  Provided number for [facility] and informed patient to call today to do Transportation Assessment over the phone to determine if she is eligible for Medicaid transportation services. Patient informed if she is eligible she will need to ensure she gives 3 business day notice when setting up transportation. Patient verbalized understanding. Patient informed she will be called next week to determine if she was able to arrange Medicaid transportation to her appointment on 05/08/15. If not, patient may need transportation to her appointment.

## 2015-05-01 NOTE — Telephone Encounter (Signed)
Transitional Care Clinic Post-discharge Follow-Up Phone Call:  Date of Discharge: 04/30/15 Principal Discharge Diagnosis(es): Acute on chronic respiratory failure, acute on chronic COPD exacerbation Post-discharge Communication: Call placed to patient's mobile [phone]) for post-discharge follow-up phone call. Unable to reach patient; left HIPPA compliant voicemail requesting return call. In addition, call placed to [phone]; left HIPPA compliant voicemail requesting return call from patient.  Call completed: No-awaiting return call from patient.

## 2015-05-04 ENCOUNTER — Telehealth: Payer: Self-pay

## 2015-05-04 ENCOUNTER — Ambulatory Visit: Payer: Self-pay | Admitting: Family Medicine

## 2015-05-04 NOTE — Telephone Encounter (Signed)
Attempted to contact the patient to inquire if she has been able to contact [facility] about an assessment for [employer] transportation.  Calls placed to # [phone] and [phone] and voice mail messages were left requesting a call back to # [phone] or [phone].

## 2015-05-05 ENCOUNTER — Telehealth: Payer: Self-pay

## 2015-05-05 NOTE — Telephone Encounter (Signed)
I have not seen a fax I was under the impression that pt just needed qualifying sats done at upcoming ov  I will add this needs to be done in the appt comments and close encounter

## 2015-05-05 NOTE — Telephone Encounter (Signed)
Called the patient to inquire if she has been able to call [facility].the assessment for transportation.  She said that she has not been able to call. Her daughter is in [facility] with a kidney infection and has developed complications.  She said that she is currently trying to arrange a ride to get to the hospital to see her daughter. and she was not sure when she would be able to call [facility].  She said that she had no other questions/concerns to report.   [facility] to assist with transportation to her appointment on 05/08/15 if needed.

## 2015-05-05 NOTE — Telephone Encounter (Signed)
Verlon Au, have you received this fax?

## 2015-05-07 ENCOUNTER — Inpatient Hospital Stay: Payer: Self-pay | Admitting: Internal Medicine

## 2015-05-07 ENCOUNTER — Telehealth: Payer: Self-pay

## 2015-05-07 NOTE — Telephone Encounter (Signed)
This Case Manager placed call to patient to check on status and to remind her of initial Transitional Care Clinic appointment on 05/08/15 at 0900. Patient indicated she is aware of appointment and has transportation to her appointment. Discussed medication compliance; patient indicated she has been taking medications but will need a refill of a few medications soon. Informed patient to inform [name] of needed medication refills at her appointment on 05/08/15. Patient verbalized understanding. No additional needs identified.

## 2015-05-08 ENCOUNTER — Encounter: Payer: Self-pay | Admitting: Family Medicine

## 2015-05-08 ENCOUNTER — Ambulatory Visit: Payer: Medicare Other | Attending: Family Medicine | Admitting: Family Medicine

## 2015-05-08 ENCOUNTER — Encounter (HOSPITAL_BASED_OUTPATIENT_CLINIC_OR_DEPARTMENT_OTHER): Payer: Medicare Other | Admitting: Clinical

## 2015-05-08 VITALS — BP 122/77 | HR 70 | Temp 99.3°F | Ht 62.0 in | Wt 200.0 lb

## 2015-05-08 DIAGNOSIS — G894 Chronic pain syndrome: Secondary | ICD-10-CM | POA: Diagnosis present

## 2015-05-08 DIAGNOSIS — J449 Chronic obstructive pulmonary disease, unspecified: Secondary | ICD-10-CM | POA: Insufficient documentation

## 2015-05-08 DIAGNOSIS — J383 Other diseases of vocal cords: Secondary | ICD-10-CM | POA: Insufficient documentation

## 2015-05-08 DIAGNOSIS — F1910 Other psychoactive substance abuse, uncomplicated: Secondary | ICD-10-CM | POA: Insufficient documentation

## 2015-05-08 DIAGNOSIS — J441 Chronic obstructive pulmonary disease with (acute) exacerbation: Secondary | ICD-10-CM | POA: Insufficient documentation

## 2015-05-08 DIAGNOSIS — Z9981 Dependence on supplemental oxygen: Secondary | ICD-10-CM | POA: Insufficient documentation

## 2015-05-08 DIAGNOSIS — F410 Panic disorder [episodic paroxysmal anxiety] without agoraphobia: Secondary | ICD-10-CM

## 2015-05-08 DIAGNOSIS — Z79899 Other long term (current) drug therapy: Secondary | ICD-10-CM | POA: Diagnosis not present

## 2015-05-08 DIAGNOSIS — F32A Depression, unspecified: Secondary | ICD-10-CM

## 2015-05-08 DIAGNOSIS — F329 Major depressive disorder, single episode, unspecified: Secondary | ICD-10-CM | POA: Diagnosis not present

## 2015-05-08 DIAGNOSIS — Z79891 Long term (current) use of opiate analgesic: Secondary | ICD-10-CM | POA: Diagnosis not present

## 2015-05-08 DIAGNOSIS — J9601 Acute respiratory failure with hypoxia: Secondary | ICD-10-CM

## 2015-05-08 DIAGNOSIS — Z659 Problem related to unspecified psychosocial circumstances: Secondary | ICD-10-CM

## 2015-05-08 MED ORDER — OXYCODONE HCL 5 MG PO TABA: 5.0000 mg | ORAL_TABLET | Freq: Two times a day (BID) | ORAL | Status: DC | PRN

## 2015-05-08 MED ORDER — OXYCODONE HCL 5 MG PO TABS: 5.0000 mg | ORAL_TABLET | Freq: Two times a day (BID) | ORAL | Status: DC | PRN

## 2015-05-08 NOTE — Progress Notes (Signed)
Patient here to establish care She is a [facility] patient She was recently hospitalized for COPD exacerbation She complains of chronic back pain and is on high doses of narcotics She is caring for her 29 week old grandbaby right now and states she almost dropped the baby yesterday due to spasms in her arms

## 2015-05-08 NOTE — Progress Notes (Signed)
TRANSITIONAL CARE CLINIC  Date of telephone encounter: 05/01/15  Admit date: 04/24/15 Discharge date: 04/30/15  PCP: None Subjective:  Patient ID: Julie Kerr, female    DOB: Dec 15, 1975  Age: 39 y.o. MRN: [id number]  CC: TCC   HPI Julie Kerr 39 year old female with a history of COPD (on oxygen), vocal cord dysfunction, depression and anxiety, chronic pain syndrome, 3 admissions in the last 6 months who comes in to get established at the transitional care clinic.  She was recently hospitalized for acute on chronic respiratory failure with hypoxia after she had presented to the ED at [facility] with wheezing and shortness of breath not responding to her MDI.Patient was placed on Brovana, Flonase, Pulmicort, IV Solu-Medrol, duo nebs, IV Levaquin. Patient continued to have wheezing/ pseudowheezing, hence pulmonology consult was obtained. She was  noticed to have strong functional component with underlying anxiety issues, called psych consult. Chart revealed several times patient was noticed to be completely comfortable eating or talking on the phone however having pseudo wheezing with adventitious sounds if any health care provider in the room. She was placed on CIWA protocol and placed on Ativan for polysubstance and alcohol abuse. Chest x-ray revealed no active cardiopulmonary process. She was subsequently discharged with recommendations to follow-up with her pulmonologist-Dr. Sherene Sires and with psychiatry- [facility].  Interval history: She missed her appointment with pulmonary and psychiatry and rescheduled for 2 weeks due to the fact that her daughter is hospitalized and she had to take care of her granddaughter who is only 74 weeks old. She [facility]. Requests a refill of oxycodone and MS Contin which she has been taking ever since she had spine surgery in [place]. Complains of low back pain and muscle spasms that make her  almost want to drop her granddaughter while holding her and states that Flexeril and Robaxin do not work for her. Also states she has run out of her portable oxygen and so did not come into the clinic with oxygen today.  Outpatient Prescriptions Prior to Visit  Medication Sig Dispense Refill  . albuterol (PROAIR HFA) 108 (90 BASE) MCG/ACT inhaler Inhale 2 puffs into the lungs every 6 (six) hours as needed for wheezing or shortness of breath.    Marland Kitchen albuterol (PROVENTIL) (2.5 MG/3ML) 0.083% nebulizer solution Take 3 mLs by nebulization every 2 (two) hours as needed for wheezing or shortness of breath. 75 mL 1  . alprazolam (XANAX) 2 MG tablet Take 0.5 tablets (1 mg total) by mouth 3 (three) times daily. 30 tablet 0  . arformoterol (BROVANA) 15 MCG/2ML NEBU Take 2 mLs (15 mcg total) by nebulization 2 (two) times daily. 120 mL 1  . azelastine (ASTELIN) 0.1 % nasal spray Place 2 sprays into both nostrils 2 (two) times daily. Use in each nostril as directed 30 mL 2  . budesonide (PULMICORT) 0.5 MG/2ML nebulizer solution Take 2 mLs (0.5 mg total) by nebulization 2 (two) times daily. 2 mL 1  . docusate sodium (COLACE) 100 MG capsule Take 1 capsule (100 mg total) by mouth 2 (two) times daily. 60 capsule 3  . ENSURE (ENSURE) Take 237 mLs by mouth 3 (three) times daily between meals.    Marland Kitchen FLUoxetine (PROZAC) 40 MG capsule Take 1 capsule (40 mg total) by mouth daily. 30 capsule 0  . fluticasone (FLONASE) 50 MCG/ACT nasal spray Place 2 sprays into both nostrils 2 (two) times daily. 16 g 3  . ipratropium-albuterol (DUONEB) 0.5-2.5 (  3) MG/3ML SOLN Take 3 mLs by nebulization every 4 (four) hours as needed (shortness of breath/wheezing).   2  . morphine (MS CONTIN) 30 MG 12 hr tablet Take 1 tablet (30 mg total) by mouth every 12 (twelve) hours. 20 tablet 0  . oxyCODONE (ROXICODONE) 15 MG immediate release tablet Take 1 tablet (15 mg total) by mouth every 6 (six) hours as needed. 20 tablet 0  . pantoprazole  (PROTONIX) 40 MG tablet Take 1 tablet (40 mg total) by mouth daily. 30 tablet 1  . polyethylene glycol (MIRALAX / GLYCOLAX) packet Take 17 g by mouth daily. 14 each 0  . pregabalin (LYRICA) 25 MG capsule Take 1 capsule (25 mg total) by mouth 2 (two) times daily. 60 capsule 1  . PRESCRIPTION MEDICATION Inhale 1 puff into the lungs every 2 (two) hours as needed (shortness of breath/wheezing).    . promethazine-codeine (PHENERGAN WITH CODEINE) 6.25-10 MG/5ML syrup Take 5 mLs by mouth every 12 (twelve) hours as needed for cough. 60 mL 0  . QUEtiapine (SEROQUEL) 100 MG tablet Take 1 tablet (100 mg total) by mouth at bedtime. 30 tablet 0  . zolpidem (AMBIEN) 10 MG tablet Take 1 tablet (10 mg total) by mouth at bedtime as needed for sleep. 30 tablet 0  . levofloxacin (LEVAQUIN) 750 MG tablet Take 1 tablet (750 mg total) by mouth daily. X 5 days 5 tablet 0  . predniSONE (DELTASONE) 10 MG tablet Prednisone dosing: Take  Prednisone 40mg  (4 tabs) x 3 days, then taper to 30mg  (3 tabs) x 3 days, then 20mg  (2 tabs) x 3days, then 10mg  (1 tab) x 3days, then OFF.  Dispense:  30 tabs, refills: None 30 tablet 0   No facility-administered medications prior to visit.    ROS Review of Systems  Constitutional: Negative for activity change, appetite change and fatigue.  HENT: Negative for congestion, sinus pressure and sore throat.   Eyes: Negative for visual disturbance.  Respiratory: Negative for cough, chest tightness, shortness of breath and wheezing.   Cardiovascular: Negative for chest pain and palpitations.  Gastrointestinal: Negative for abdominal pain, constipation and abdominal distention.  Endocrine: Negative for polydipsia.  Genitourinary: Negative for dysuria and frequency.  Musculoskeletal: Positive for myalgias and back pain. Negative for arthralgias.  Skin: Negative for rash.  Neurological: Negative for tremors, light-headedness and numbness.  Hematological: Does not bruise/bleed easily.    Psychiatric/Behavioral: Negative for behavioral problems and agitation.    Objective:  BP 122/77 mmHg  Pulse 70  Temp(Src) 99.3 F (37.4 C)  Ht 5\' 2"  (1.575 m)  Wt 200 lb (90.719 kg)  BMI 36.57 kg/m2  SpO2 99%  LMP 04/16/2015  BP/Weight 05/08/2015 04/30/2015 04/24/2015  Systolic BP 122 111 -  Diastolic BP 77 58 -  Wt. (Lbs) 200 210.1 -  BMI 36.57 - 37.23    Lab Results  Component Value Date   WBC 19.5* 04/29/2015   HGB 10.0* 04/29/2015   HCT 31.1* 04/29/2015   PLT 339 04/29/2015   GLUCOSE 107* 04/30/2015   TRIG 76 07/29/2014   ALT 27 04/27/2015   AST 33 04/27/2015   NA 135 04/30/2015   K 3.9 04/30/2015   CL 95* 04/30/2015   CREATININE 1.00 04/30/2015   BUN 23* 04/30/2015   CO2 30 04/30/2015   TSH 0.419 10/08/2014   INR 1.03 03/24/2015   HGBA1C 5.9* 04/27/2015    Physical Exam  Constitutional: She is oriented to person, place, and time. She appears well-developed and well-nourished. No  distress.  HENT:  Head: Normocephalic.  Right Ear: External ear normal.  Left Ear: External ear normal.  Nose: Nose normal.  Mouth/Throat: Oropharynx is clear and moist.  Eyes: Conjunctivae and EOM are normal. Pupils are equal, round, and reactive to light.  Neck: Normal range of motion. No JVD present.  Cardiovascular: Normal rate, regular rhythm, normal heart sounds and intact distal pulses.  Exam reveals no gallop.   No murmur heard. Pulmonary/Chest: Effort normal and breath sounds normal. No respiratory distress. She has no wheezes. She has no rales. She exhibits no tenderness.  Abdominal: Soft. Bowel sounds are normal. She exhibits no distension and no mass. There is no tenderness.  Musculoskeletal: She exhibits no edema or tenderness.  Vertical lumbar spine surgical scar  Neurological: She is alert and oriented to person, place, and time. She has normal reflexes.  Skin: Skin is warm and dry. She is not diaphoretic.  Psychiatric: She has a normal mood and affect.      Assessment & Plan:   1. Chronic pain syndrome I have explained to this patient that she is on huge doses of narcotic which could amount to respiratory depression. My physical exam findings do not support the huge doses of narcotics she is on I have offered to refer her to the pain clinic for continual pain management however I will be giving her a one-time prescription of oxycodone 5 mg to prevent her from going into withdrawal as she took her last dose of MS Contin and oxycodone 15 mg yesterday - Ambulatory referral to Pain Clinic  2. Vocal cord dysfunction Stable  3. Polysubstance abuse She is on multiple controlled substances which I have pointed out to her: Ambien, Xanax, MS Contin, oxycodone. She is insisting that she needs all of those medications but I am of the opinion that she needs to be weaned off some of this medication and I have informed her that I will not be refilling this medications for her here  at the [facility].  4. Depression Currently on Seroquel. She missed her appointment with the [facility] and rescheduled for 2 weeks. LCSW called in to see the patient to help link her up with community resources and ensure compliance with her psychiatry appointment.  5. Acute respiratory failure with hypoxia Oxygen saturation is surprisingly normal at 99% off oxygen. She has been advised to call [facility] to be. Her portable oxygen which she has run out of.  6. Chronic obstructive pulmonary disease with acute exacerbation Currently on Brovana and DuoNeb as needed as well as Proventil HFA Missed her last appointment with [facility] and has rescheduled for 2 weeks from now.  7. Panic attacks She tells me she uses Xanax only as needed. I have advised her to use this sparingly due to the high risk for addiction and potential for respiratory depression  8. Oxygen dependent Continue 2 L of oxygen as needed.   Meds ordered this encounter  Medications  .  DISCONTD: OxyCODONE HCl, Abuse Deter, (OXAYDO) 5 MG TABA    Sig: Take 5 mg by mouth 2 (two) times daily as needed.    Dispense:  30 tablet    Refill:  0    Do not refill after this; she will be obtaining subsequent scripts from pain mgt.  Marland Kitchen oxyCODONE (ROXICODONE) 5 MG immediate release tablet    Sig: Take 1 tablet (5 mg total) by mouth 2 (two) times daily as needed for severe pain.    Dispense:  30 tablet    Refill:  0    Do not refill; future refills will come from pain management    Follow-up: Return in about 1 week (around 05/15/2015), or if symptoms worsen or fail to improve, for [facility] follow up.   Jaclyn Shaggy MD

## 2015-05-08 NOTE — Progress Notes (Signed)
ASSESSMENT: Pt currently experiencing symptoms of anxiety and depression. Pt needs to f/u with PCP, establish [facility] med management, and would benefit from psychoeducation and supportive counseling regarding coping with symptoms of anxiety and depression.  Stage of Change: precontemplative  PLAN: 1. F/U with behavioral health consultant in as needed 2. Psychiatric Medications: xanax, prozac, lyrica, seroquel, ambien. 3. Behavioral recommendation(s):   -Go to [employer] for [employer] med management -Consider reading educational material regarding coping with symptoms of anxiety and depression  SUBJECTIVE: Pt. referred by Dr Venetia Night for symptoms of anxiety and depression, psychosocial problems Pt. reports the following symptoms/concerns: Pt states that she does not need help with anything, denies any symptoms of anxiety or depression.  Duration of problem: unknown Severity: unknown  OBJECTIVE: Orientation & Cognition: Oriented x3. Thought processes normal and appropriate to situation. Mood: low Affect: guarded Appearance: appropriate Risk of harm to self or others: no risk of harm to self or others Substance use: none Assessments administered: PHQ2: 2  Diagnosis: Problem related to psychosocial circumstances CPT Code: Z65.9 -------------------------------------------- Other(s) present in the room: none  Time spent with patient in exam room: 10 minutes

## 2015-05-08 NOTE — Patient Instructions (Signed)

## 2015-05-14 ENCOUNTER — Telehealth: Payer: Self-pay

## 2015-05-14 NOTE — Telephone Encounter (Signed)
This Case Manager attempted to reach patient to check on status and remind her of [facility]itional Care Clinic follow-up appointment on 05/15/15 at 1100. Call placed to [phone]-message stated that number "not accepting calls at this time."  In addition, placed call to [phone]; unable to reach patient. HIPPA compliant voicemail left requesting return call.

## 2015-05-15 ENCOUNTER — Ambulatory Visit: Payer: Self-pay | Admitting: Family Medicine

## 2015-05-18 ENCOUNTER — Telehealth: Payer: Self-pay | Admitting: Family Medicine

## 2015-05-18 ENCOUNTER — Telehealth: Payer: Self-pay

## 2015-05-18 NOTE — Telephone Encounter (Signed)
Attempted to contact the patient to check on her status and discuss scheduling another appointment at the [facility]. Call placed to # [phone] (H) and # [phone] (M) and voice mail messages were left requesting a call back to # [phone] or [phone].

## 2015-05-18 NOTE — Telephone Encounter (Signed)
Patient called requesting a med refill on her pain medicine, inhaler, and promethazine-codeine (PHENERGAN WITH CODEINE) 6.25-10 MG/5ML syrup. Please f/u with pt.

## 2015-05-28 ENCOUNTER — Telehealth: Payer: Self-pay

## 2015-05-28 NOTE — Telephone Encounter (Signed)
This Case Manager placed call to patient to remind her of her [facility]itional Care Clinic follow-up appointment with Dr. Venetia NightAmao on 05/29/15 at 0930. Patient aware of appointment and indicated she had transportation to her appointment. Informed patient to bring all of her medications to her appointment so Dr. Venetia NightAmao can review and determine if any refills needed. Patient verbalized understanding and appreciative of call.  No additional needs identified.

## 2015-05-29 ENCOUNTER — Encounter: Payer: Self-pay | Admitting: Family Medicine

## 2015-05-29 ENCOUNTER — Ambulatory Visit: Payer: Medicare Other | Attending: Family Medicine | Admitting: Family Medicine

## 2015-05-29 ENCOUNTER — Telehealth: Payer: Self-pay

## 2015-05-29 VITALS — BP 102/67 | HR 85 | Temp 99.2°F | Resp 18 | Ht 62.0 in | Wt 194.0 lb

## 2015-05-29 DIAGNOSIS — J45901 Unspecified asthma with (acute) exacerbation: Secondary | ICD-10-CM | POA: Diagnosis not present

## 2015-05-29 DIAGNOSIS — J45909 Unspecified asthma, uncomplicated: Secondary | ICD-10-CM | POA: Insufficient documentation

## 2015-05-29 DIAGNOSIS — M545 Low back pain: Secondary | ICD-10-CM | POA: Diagnosis not present

## 2015-05-29 DIAGNOSIS — F329 Major depressive disorder, single episode, unspecified: Secondary | ICD-10-CM | POA: Insufficient documentation

## 2015-05-29 DIAGNOSIS — I10 Essential (primary) hypertension: Secondary | ICD-10-CM | POA: Diagnosis not present

## 2015-05-29 DIAGNOSIS — J9601 Acute respiratory failure with hypoxia: Secondary | ICD-10-CM | POA: Insufficient documentation

## 2015-05-29 DIAGNOSIS — R296 Repeated falls: Secondary | ICD-10-CM | POA: Diagnosis not present

## 2015-05-29 DIAGNOSIS — J441 Chronic obstructive pulmonary disease with (acute) exacerbation: Secondary | ICD-10-CM | POA: Diagnosis not present

## 2015-05-29 DIAGNOSIS — F419 Anxiety disorder, unspecified: Secondary | ICD-10-CM | POA: Diagnosis not present

## 2015-05-29 DIAGNOSIS — Z9981 Dependence on supplemental oxygen: Secondary | ICD-10-CM | POA: Diagnosis not present

## 2015-05-29 DIAGNOSIS — Z79891 Long term (current) use of opiate analgesic: Secondary | ICD-10-CM | POA: Insufficient documentation

## 2015-05-29 DIAGNOSIS — F410 Panic disorder [episodic paroxysmal anxiety] without agoraphobia: Secondary | ICD-10-CM | POA: Diagnosis not present

## 2015-05-29 DIAGNOSIS — E119 Type 2 diabetes mellitus without complications: Secondary | ICD-10-CM | POA: Diagnosis not present

## 2015-05-29 DIAGNOSIS — Z87891 Personal history of nicotine dependence: Secondary | ICD-10-CM | POA: Insufficient documentation

## 2015-05-29 DIAGNOSIS — G894 Chronic pain syndrome: Secondary | ICD-10-CM | POA: Diagnosis not present

## 2015-05-29 DIAGNOSIS — F32A Depression, unspecified: Secondary | ICD-10-CM

## 2015-05-29 MED ORDER — PREGABALIN 50 MG PO CAPS: 50.0000 mg | ORAL_CAPSULE | Freq: Two times a day (BID) | ORAL | Status: DC

## 2015-05-29 MED ORDER — PROMETHAZINE-CODEINE 6.25-10 MG/5ML PO SYRP: 5.0000 mL | ORAL_SOLUTION | Freq: Two times a day (BID) | ORAL | Status: DC | PRN

## 2015-05-29 MED ORDER — AZITHROMYCIN 250 MG PO TABS: ORAL_TABLET | ORAL | Status: DC

## 2015-05-29 MED ORDER — METHYLPREDNISOLONE SODIUM SUCC 125 MG IJ SOLR
62.5000 mg | Freq: Once | INTRAMUSCULAR | Status: AC
  Administered 2015-05-29: 62.5 mg via INTRAMUSCULAR

## 2015-05-29 MED ORDER — OXYCODONE HCL 5 MG PO TABS: 5.0000 mg | ORAL_TABLET | Freq: Two times a day (BID) | ORAL | Status: DC | PRN

## 2015-05-29 NOTE — Telephone Encounter (Signed)
This Case Manager spoke with Dr. Jarold Song who indicated [facility] should be contacted because patient needs her portable oxygen tanks refilled. Patient came to her appointment without her oxygen because her portable oxygen tanks were empty. She also indicated that [facility] needed to be contacted because patients needing increased [place] hours as she has had multiple falls since she was discharged from the hospital. Met with patient while patient in the clinic seeing Dr. Jarold Song. Patient indicated her portable oxygen tanks needed to be refilled. She also needed her oxygen concentrator serviced. Patient indicated [place] was her oxygen supplier.  Call placed to [place] [phone]) and spoke with [facility]. Sonya indicated [place] is not her oxygen supplier.  Call placed to patient to have her look at the oxygen tanks she has at home to determine if name of oxygen supplier on tanks.  Unable to reach patient; message left requesting return call.  In addition, placed call to [employer] [phone]) to determine how [place] could be increased. Spoke with Tammy who confirmed patient receiving Personal Care Services through [facility]. She indicated [place] application will have to be completed noting change of medical status.  Dr. Jarold Song to complete form.

## 2015-05-29 NOTE — Progress Notes (Signed)
TRANSITIONAL CARE CLINIC  Date of telephone encounter: 05/01/15 Date of first face-to-face visit: 05/08/15  PCP: None  Subjective:    Patient ID: Julie Kerr, female    DOB: 23-Mar-1976, 39 y.o.   MRN: [id number]  HPI  Julie Kerr 39 year old female with a history of COPD (on oxygen), vocal cord dysfunction, previous history of tracheostomy, depression and anxiety, chronic pain syndrome, 3 admissions in the last 6 months recently hospitalized at [facility] from 04/24/15-04/30/15 for acute respiratory failure secondary to COPD exacerbation.  At her last office visit she had complained of significant back pain as she had run out of her MS Contin and oxycodone pills. She was previously seeing a doctor at [facility] after her spine surgery who was giving her oxycodone 15 mg every 6 and MS Contin 30 mg every 12. She was also on Ambien and Xanax and I had discussed my concerns with her about the multiple controlled substances she was taking and potential for respiratory depression. I had referred her to pain management and given her oxycodone 5 mg every 12 hours to prevent her from going into withdrawal; she reports to me that she has weaned herself off Xanax and Ambien.  Today she complains of multiple falls as a result of persisting back pain in her leg giving out in an attempt to ambulate even though she with a walker. She is requesting increased hours of home care she receives only 2 hours which is not sufficient. Also complains of "not feeling good", cough, postnasal drip, no feverand recently completed a course of prednisone taper. She has been to see mental health at the [facility] and informs me she also saw her pulmonologist with no change in management.     Past Medical History  Diagnosis Date  . Asthma   . Anxiety   . COPD (chronic obstructive pulmonary disease) (HCC)   . Bronchitis   . Heart murmur   . Shortness of breath   . Diabetes mellitus without complication  (HCC)   . Hypertension     Past Surgical History  Procedure Laterality Date  . Spinal fusion    . Tracheostomy  December 2015    Social History   Social History  . Marital Status: Single    Spouse Name: N/A  . Number of Children: N/A  . Years of Education: N/A   Occupational History  . Not on file.   Social History Main Topics  . Smoking status: Former Smoker -- 2.00 packs/day for 17 years    Types: Cigarettes    Quit date: 06/15/2014  . Smokeless tobacco: Never Used  . Alcohol Use: 0.0 oz/week    0 Standard drinks or equivalent per week     Comment: occasional  . Drug Use: No  . Sexual Activity: Yes    Birth Control/ Protection: Other-see comments     Comment: female partners only   Other Topics Concern  . Not on file   Social History Narrative    Allergies  Allergen Reactions  . Robitussin Dm [Dextromethorphan-Guaifenesin] Nausea And Vomiting  . Chocolate Hives  . Nsaids Hives  . Rayon, Purified Hives  . Tramadol Hives      Review of Systems Constitutional: Negative for activity change, appetite change and fatigue.  HENT: Negative for congestion, sinus pressure and sore throat, positive for post nasal drip Eyes: Negative for visual disturbance.  Respiratory: positive for cough, chest tightness, shortness of breath and wheezing.   Cardiovascular: Negative for chest pain and  palpitations.  Gastrointestinal: Negative for abdominal pain, constipation and abdominal distention.  Endocrine: Negative for polydipsia.  Genitourinary: Negative for dysuria and frequency.  Musculoskeletal: Positive for myalgias and back pain. Negative for arthralgias.  Skin: Negative for rash.  Neurological: Negative for tremors, light-headedness and numbness.  Hematological: Does not bruise/bleed easily.  Psychiatric/Behavioral: Negative for behavioral problems and agitation.      Objective: Filed Vitals:   05/29/15 0953  BP: 102/67  Pulse: 85  Temp: 99.2 F (37.3 C)    TempSrc: Oral  Resp: 18  Height: 5\' 2"  (1.575 m)  Weight: 194 lb (87.998 kg)  SpO2: 100%      Physical Exam Constitutional: She is oriented to person, place, and time. She appears well-developed and well-nourished. No distress.  HENT:  Head: Normocephalic.  Right Ear: External ear normal.  Left Ear: External ear normal.  Nose: Nose normal.  Mouth/Throat: Oropharynx is clear and moist.  Eyes: Conjunctivae and EOM are normal. Pupils are equal, round, and reactive to light.  Neck: Normal range of motion. No JVD present.  Cardiovascular: Normal rate, regular rhythm, normal heart sounds and intact distal pulses.  Exam reveals no gallop.   No murmur heard. Pulmonary/Chest: Effort normal and breath sounds normal. No respiratory distress. She has no wheezes. She has no rales. She exhibits no tenderness.  Abdominal: Soft. Bowel sounds are normal. She exhibits no distension and no mass. There is no tenderness.  Musculoskeletal: She exhibits no edema or tenderness.  Vertical lumbar spine surgical scar  Neurological: She is alert and oriented to person, place, and time. She has normal reflexes.  Skin: Skin is warm and dry. She is not diaphoretic.  Psychiatric: She has a normal mood and affect.        Assessment & Plan:  1. Chronic pain syndrome She has been on huge doses of narcotics from her previous doctor which could amount to respiratory depression and I did explain this to her at her last office visit and also the fact that My physical exam findings do not support the huge doses of narcotics she is on [facility] and she is yet to hear from them. I will refill oxycodone 5 mg and give her only 30 pills until she can get in to see a pain management  2. Multiple falls secondary to chronic low back pain Completed FL2 form for increased hours [facility]  3. Polysubstance abuse She is on multiple controlled substances which I have pointed out to her:  Ambien, Xanax, MS Contin, oxycodone. I have commended her for tapering herself off Xanax and Ambien.  4. Depression Currently on Seroquel. Managed by the [facility] for mental health issues.  5. Acute respiratory failure with hypoxia Oxygen saturation is surprisingly normal at 99% off oxygen. We will call [facility] to ensure delivery of her portable oxygen..  6. Chronic obstructive pulmonary disease with acute exacerbation Currently on Brovana and DuoNeb as needed as well as Proventil HFA Intramuscular Solu-Medrol given in the clinic. Placed on azithromycin and antitussive refilled. She [facility] and informs me today that she had seen her pulmonologist with no change in management but review of her chart indicates she has not been seen since 01/2015   7. Panic attacks Currently off Xanax Keep appointment with mental health.  8. Oxygen dependent Continue 2 L of oxygen as needed.  This note has been created with Teaching laboratory technicianDragon speech recognition software and smart phrase  technology. Any transcriptional errors are unintentional.

## 2015-05-29 NOTE — Progress Notes (Signed)
Patient here for HFU from Back surgery.  Patient complains of pain from fusion which did not fuse scaled at a 10, described as a sharp burning constant pain. Patient states medications help with the pain.  Patient states "I dont feel good"  Patient states lyrica does not help, actually makes pain worse. Patient needs cough medications.  Patient has taken meds this morning, patient has not eaten.

## 2015-05-29 NOTE — Patient Instructions (Signed)
Asthma, Adult Asthma is a recurring condition in which the airways tighten and narrow. Asthma can make it difficult to breathe. It can cause coughing, wheezing, and shortness of breath. Asthma episodes, also called asthma attacks, range from minor to life-threatening. Asthma cannot be cured, but medicines and lifestyle changes can help control it. CAUSES Asthma is believed to be caused by inherited (genetic) and environmental factors, but its exact cause is unknown. Asthma may be triggered by allergens, lung infections, or irritants in the air. Asthma triggers are different for each person. Common triggers include:   Animal dander.  Dust mites.  Cockroaches.  Pollen from trees or grass.  Mold.  Smoke.  Air pollutants such as dust, household cleaners, hair sprays, aerosol sprays, paint fumes, strong chemicals, or strong odors.  Cold air, weather changes, and winds (which increase molds and pollens in the air).  Strong emotional expressions such as crying or laughing hard.  Stress.  Certain medicines (such as aspirin) or types of drugs (such as beta-blockers).  Sulfites in foods and drinks. Foods and drinks that may contain sulfites include dried fruit, potato chips, and sparkling grape juice.  Infections or inflammatory conditions such as the flu, a cold, or an inflammation of the nasal membranes (rhinitis).  Gastroesophageal reflux disease (GERD).  Exercise or strenuous activity. SYMPTOMS Symptoms may occur immediately after asthma is triggered or many hours later. Symptoms include:  Wheezing.  Excessive nighttime or early morning coughing.  Frequent or severe coughing with a common cold.  Chest tightness.  Shortness of breath. DIAGNOSIS  The diagnosis of asthma is made by a review of your medical history and a physical exam. Tests may also be performed. These may include:  Lung function studies. These tests show how much air you breathe in and out.  Allergy  tests.  Imaging tests such as X-rays. TREATMENT  Asthma cannot be cured, but it can usually be controlled. Treatment involves identifying and avoiding your asthma triggers. It also involves medicines. There are 2 classes of medicine used for asthma treatment:   Controller medicines. These prevent asthma symptoms from occurring. They are usually taken every day.  Reliever or rescue medicines. These quickly relieve asthma symptoms. They are used as needed and provide short-term relief. Your health care provider will help you create an asthma action plan. An asthma action plan is a written plan for managing and treating your asthma attacks. It includes a list of your asthma triggers and how they may be avoided. It also includes information on when medicines should be taken and when their dosage should be changed. An action plan may also involve the use of a device called a peak flow meter. A peak flow meter measures how well the lungs are working. It helps you monitor your condition. HOME CARE INSTRUCTIONS   Take medicines only as directed by your health care provider. Speak with your health care provider if you have questions about how or when to take the medicines.  Use a peak flow meter as directed by your health care provider. Record and keep track of readings.  Understand and use the action plan to help minimize or stop an asthma attack without needing to seek medical care.  Control your home environment in the following ways to help prevent asthma attacks:  Do not smoke. Avoid being exposed to secondhand smoke.  Change your heating and air conditioning filter regularly.  Limit your use of fireplaces and wood stoves.  Get rid of pests (such as roaches   and mice) and their droppings.  Throw away plants if you see mold on them.  Clean your floors and dust regularly. Use unscented cleaning products.  Try to have someone else vacuum for you regularly. Stay out of rooms while they are  being vacuumed and for a short while afterward. If you vacuum, use a dust mask from a hardware store, a double-layered or microfilter vacuum cleaner bag, or a vacuum cleaner with a HEPA filter.  Replace carpet with wood, tile, or vinyl flooring. Carpet can trap dander and dust.  Use allergy-proof pillows, mattress covers, and box spring covers.  Wash bed sheets and blankets every week in hot water and dry them in a dryer.  Use blankets that are made of polyester or cotton.  Clean bathrooms and kitchens with bleach. If possible, have someone repaint the walls in these rooms with mold-resistant paint. Keep out of the rooms that are being cleaned and painted.  Wash hands frequently. SEEK MEDICAL CARE IF:   You have wheezing, shortness of breath, or a cough even if taking medicine to prevent attacks.  The colored mucus you cough up (sputum) is thicker than usual.  Your sputum changes from clear or white to yellow, green, gray, or bloody.  You have any problems that may be related to the medicines you are taking (such as a rash, itching, swelling, or trouble breathing).  You are using a reliever medicine more than 2-3 times per week.  Your peak flow is still at 50-79% of your personal best after following your action plan for 1 hour.  You have a fever. SEEK IMMEDIATE MEDICAL CARE IF:   You seem to be getting worse and are unresponsive to treatment during an asthma attack.  You are short of breath even at rest.  You get short of breath when doing very little physical activity.  You have difficulty eating, drinking, or talking due to asthma symptoms.  You develop chest pain.  You develop a fast heartbeat.  You have a bluish color to your lips or fingernails.  You are light-headed, dizzy, or faint.  Your peak flow is less than 50% of your personal best.   This information is not intended to replace advice given to you by your health care provider. Make sure you discuss any  questions you have with your health care provider.   Document Released: 08/01/2005 Document Revised: 04/22/2015 Document Reviewed: 02/28/2013 Elsevier Interactive Patient Education 2016 [employer] Inc.  

## 2015-05-29 NOTE — Telephone Encounter (Signed)
Personal Care Services form completed by Dr. Venetia NightAmao. Form faxed to [facility], fax #[phone].

## 2015-06-01 ENCOUNTER — Telehealth: Payer: Self-pay

## 2015-06-01 NOTE — Telephone Encounter (Signed)
Call placed to the patient to verify the company that is providing her O2.  She stated that [facility] # [phone] is the sticker on the O2 concentrator.  She then noted that the sticker on the O2 tank indicates [employer]  Call then placed to [employer] and there was a fast busy tone despite multiple calls.  Call then placed to [employer] # [phone]. Spoke to [place] who verified the phone # for [facility] ( [facility]) which she noted is owned by [employer].  This CM informed her of the inability to complete the call to [facility]. She then called [facility] and was able to get through.  Call placed again to [employer] and the phone continued to have a fast busy signal.   This CM then called [employer] and spoke to [place] who took this CM contact # and stated that she would notify [facility] to contact this CM.

## 2015-06-01 NOTE — Telephone Encounter (Signed)
[facility]  # [phone].  Informed her that the patient stated that her O2 is running out and her O2 concentrator needs service.  Selena BattenKim stated that she would contact the patient about her oxygen needs.

## 2015-06-02 ENCOUNTER — Telehealth: Payer: Self-pay

## 2015-06-02 NOTE — Telephone Encounter (Signed)
Call placed to the patient to inquire if she had received a call from [facility] ( [facility]) about her oxygen and she stated that no one has contacted her.  She said that the best # to reach her is her mobile [phone]. Informed her that this CM would contact [facility] for her.   She then stated that she is still " coughing" a lot and noted that she has been taking the cough medicine as ordered by the doctor but it does not seem to be working.  She said that she had a fever  ( 101.6) on 10/14 and a fever of ( 102 ) in the  morning of 10/15 that was relieved with tylenol and she has not had a fever since then.  She stated that she has occasional wheezing and has been using her nebulizer 4-5 times a day and has been using her inhaler and taking her other medications as ordered.  She reported that her cough is productive at times with yellow mucous. She reported no other concerns and stated that she does not need to go to the hospital she thinks that she may need a change in cough medicine. Informed her that Dr Venetia NightAmao would be notified of her concern.   Call then placed to [facility] and spoke to [place] who stated that they have tried to reach the patient multiple times.  They did not have the patient's mobile phone #..  Provided Larita FifeLynn with [phone] and she stated that they would contact the patient.

## 2015-06-03 ENCOUNTER — Inpatient Hospital Stay (HOSPITAL_COMMUNITY)
Admission: EM | Admit: 2015-06-03 | Discharge: 2015-06-06 | DRG: 189 | Disposition: A | Discharge status: Home / Self Care | Payer: Medicare Other | Attending: Internal Medicine | Admitting: Internal Medicine

## 2015-06-03 ENCOUNTER — Encounter (HOSPITAL_COMMUNITY): Payer: Self-pay | Admitting: Emergency Medicine

## 2015-06-03 ENCOUNTER — Telehealth: Payer: Self-pay

## 2015-06-03 ENCOUNTER — Emergency Department (HOSPITAL_COMMUNITY): Payer: Medicare Other

## 2015-06-03 DIAGNOSIS — E119 Type 2 diabetes mellitus without complications: Secondary | ICD-10-CM | POA: Diagnosis present

## 2015-06-03 DIAGNOSIS — G8929 Other chronic pain: Secondary | ICD-10-CM | POA: Diagnosis present

## 2015-06-03 DIAGNOSIS — R062 Wheezing: Secondary | ICD-10-CM | POA: Diagnosis not present

## 2015-06-03 DIAGNOSIS — Z886 Allergy status to analgesic agent status: Secondary | ICD-10-CM | POA: Diagnosis not present

## 2015-06-03 DIAGNOSIS — Z981 Arthrodesis status: Secondary | ICD-10-CM

## 2015-06-03 DIAGNOSIS — Z87891 Personal history of nicotine dependence: Secondary | ICD-10-CM

## 2015-06-03 DIAGNOSIS — Z91018 Allergy to other foods: Secondary | ICD-10-CM | POA: Diagnosis not present

## 2015-06-03 DIAGNOSIS — R0600 Dyspnea, unspecified: Secondary | ICD-10-CM | POA: Diagnosis not present

## 2015-06-03 DIAGNOSIS — J449 Chronic obstructive pulmonary disease, unspecified: Secondary | ICD-10-CM | POA: Diagnosis present

## 2015-06-03 DIAGNOSIS — J383 Other diseases of vocal cords: Secondary | ICD-10-CM | POA: Diagnosis not present

## 2015-06-03 DIAGNOSIS — Z888 Allergy status to other drugs, medicaments and biological substances status: Secondary | ICD-10-CM | POA: Diagnosis not present

## 2015-06-03 DIAGNOSIS — J45909 Unspecified asthma, uncomplicated: Secondary | ICD-10-CM | POA: Diagnosis present

## 2015-06-03 DIAGNOSIS — Z91048 Other nonmedicinal substance allergy status: Secondary | ICD-10-CM

## 2015-06-03 DIAGNOSIS — I10 Essential (primary) hypertension: Secondary | ICD-10-CM | POA: Diagnosis present

## 2015-06-03 DIAGNOSIS — F419 Anxiety disorder, unspecified: Secondary | ICD-10-CM | POA: Diagnosis present

## 2015-06-03 DIAGNOSIS — F1120 Opioid dependence, uncomplicated: Secondary | ICD-10-CM | POA: Diagnosis not present

## 2015-06-03 DIAGNOSIS — R0603 Acute respiratory distress: Secondary | ICD-10-CM

## 2015-06-03 DIAGNOSIS — J9811 Atelectasis: Secondary | ICD-10-CM | POA: Diagnosis not present

## 2015-06-03 DIAGNOSIS — J9601 Acute respiratory failure with hypoxia: Secondary | ICD-10-CM | POA: Diagnosis not present

## 2015-06-03 DIAGNOSIS — J80 Acute respiratory distress syndrome: Secondary | ICD-10-CM | POA: Diagnosis not present

## 2015-06-03 DIAGNOSIS — J9621 Acute and chronic respiratory failure with hypoxia: Secondary | ICD-10-CM | POA: Diagnosis present

## 2015-06-03 LAB — I-STAT ARTERIAL BLOOD GAS, ED
Acid-Base Excess: 1 mmol/L (ref 0.0–2.0)
Bicarbonate: 25.6 mEq/L — ABNORMAL HIGH (ref 20.0–24.0)
O2 Saturation: 100 %
PCO2 ART: 38.8 mmHg (ref 35.0–45.0)
PH ART: 7.428 (ref 7.350–7.450)
PO2 ART: 209 mmHg — AB (ref 80.0–100.0)
Patient temperature: 98.6
TCO2: 27 mmol/L (ref 0–100)

## 2015-06-03 LAB — CBC WITH DIFFERENTIAL/PLATELET
BASOS ABS: 0 10*3/uL (ref 0.0–0.1)
Basophils Relative: 0 %
EOS ABS: 0 10*3/uL (ref 0.0–0.7)
EOS PCT: 0 %
HCT: 37.2 % (ref 36.0–46.0)
Hemoglobin: 11.7 g/dL — ABNORMAL LOW (ref 12.0–15.0)
LYMPHS PCT: 30 %
Lymphs Abs: 3.1 10*3/uL (ref 0.7–4.0)
MCH: 24.8 pg — AB (ref 26.0–34.0)
MCHC: 31.5 g/dL (ref 30.0–36.0)
MCV: 78.8 fL (ref 78.0–100.0)
MONO ABS: 0.5 10*3/uL (ref 0.1–1.0)
Monocytes Relative: 5 %
Neutro Abs: 6.8 10*3/uL (ref 1.7–7.7)
Neutrophils Relative %: 65 %
PLATELETS: 418 10*3/uL — AB (ref 150–400)
RBC: 4.72 MIL/uL (ref 3.87–5.11)
RDW: 16.1 % — AB (ref 11.5–15.5)
WBC: 10.5 10*3/uL (ref 4.0–10.5)

## 2015-06-03 LAB — COMPREHENSIVE METABOLIC PANEL
ALT: 10 U/L — ABNORMAL LOW (ref 14–54)
AST: 14 U/L — AB (ref 15–41)
Albumin: 3.7 g/dL (ref 3.5–5.0)
Alkaline Phosphatase: 51 U/L (ref 38–126)
Anion gap: 11 (ref 5–15)
BUN: 9 mg/dL (ref 6–20)
CHLORIDE: 104 mmol/L (ref 101–111)
CO2: 24 mmol/L (ref 22–32)
Calcium: 10.2 mg/dL (ref 8.9–10.3)
Creatinine, Ser: 0.71 mg/dL (ref 0.44–1.00)
Glucose, Bld: 85 mg/dL (ref 65–99)
POTASSIUM: 4.1 mmol/L (ref 3.5–5.1)
SODIUM: 139 mmol/L (ref 135–145)
Total Bilirubin: 0.3 mg/dL (ref 0.3–1.2)
Total Protein: 7.1 g/dL (ref 6.5–8.1)

## 2015-06-03 LAB — MRSA PCR SCREENING: MRSA BY PCR: NEGATIVE

## 2015-06-03 MED ORDER — BUDESONIDE 0.5 MG/2ML IN SUSP
0.5000 mg | Freq: Two times a day (BID) | RESPIRATORY_TRACT | Status: DC
  Administered 2015-06-03 – 2015-06-06 (×6): 0.5 mg via RESPIRATORY_TRACT
  Filled 2015-06-03 (×2): qty 2
  Filled 2015-06-03: qty 4
  Filled 2015-06-03 (×4): qty 2
  Filled 2015-06-03: qty 4
  Filled 2015-06-03: qty 2

## 2015-06-03 MED ORDER — FLUOXETINE HCL 20 MG PO CAPS
40.0000 mg | ORAL_CAPSULE | Freq: Every day | ORAL | Status: DC
  Administered 2015-06-03 – 2015-06-05 (×3): 40 mg via ORAL
  Filled 2015-06-03 (×5): qty 2

## 2015-06-03 MED ORDER — ARFORMOTEROL TARTRATE 15 MCG/2ML IN NEBU
15.0000 ug | INHALATION_SOLUTION | Freq: Two times a day (BID) | RESPIRATORY_TRACT | Status: DC
  Administered 2015-06-03 – 2015-06-06 (×6): 15 ug via RESPIRATORY_TRACT
  Filled 2015-06-03 (×8): qty 2

## 2015-06-03 MED ORDER — SODIUM CHLORIDE 0.9 % IV SOLN
INTRAVENOUS | Status: DC
  Administered 2015-06-03 – 2015-06-04 (×2): via INTRAVENOUS

## 2015-06-03 MED ORDER — FLUTICASONE PROPIONATE 50 MCG/ACT NA SUSP
2.0000 | Freq: Every day | NASAL | Status: DC
  Administered 2015-06-03 – 2015-06-06 (×4): 2 via NASAL
  Filled 2015-06-03: qty 16

## 2015-06-03 MED ORDER — MAGNESIUM SULFATE 2 GM/50ML IV SOLN
2.0000 g | Freq: Once | INTRAVENOUS | Status: AC
  Administered 2015-06-03: 2 g via INTRAVENOUS
  Filled 2015-06-03: qty 50

## 2015-06-03 MED ORDER — LORAZEPAM 2 MG/ML IJ SOLN
1.0000 mg | Freq: Once | INTRAMUSCULAR | Status: AC
  Administered 2015-06-03: 1 mg via INTRAVENOUS
  Filled 2015-06-03: qty 1

## 2015-06-03 MED ORDER — SODIUM CHLORIDE 0.9 % IV SOLN: 250.0000 mL | INTRAVENOUS | Status: DC | PRN

## 2015-06-03 MED ORDER — PANTOPRAZOLE SODIUM 40 MG PO TBEC
40.0000 mg | DELAYED_RELEASE_TABLET | Freq: Every day | ORAL | Status: DC
  Administered 2015-06-04 – 2015-06-05 (×2): 40 mg via ORAL
  Filled 2015-06-03 (×2): qty 1

## 2015-06-03 MED ORDER — HEPARIN SODIUM (PORCINE) 5000 UNIT/ML IJ SOLN
5000.0000 [IU] | Freq: Three times a day (TID) | INTRAMUSCULAR | Status: DC
  Administered 2015-06-03 – 2015-06-06 (×8): 5000 [IU] via SUBCUTANEOUS
  Filled 2015-06-03 (×10): qty 1

## 2015-06-03 MED ORDER — QUETIAPINE FUMARATE 100 MG PO TABS
100.0000 mg | ORAL_TABLET | Freq: Every day | ORAL | Status: DC
  Administered 2015-06-03 – 2015-06-05 (×3): 100 mg via ORAL
  Filled 2015-06-03 (×4): qty 1

## 2015-06-03 MED ORDER — OXYCODONE HCL 5 MG PO TABS
7.5000 mg | ORAL_TABLET | Freq: Four times a day (QID) | ORAL | Status: DC | PRN
  Administered 2015-06-03 – 2015-06-06 (×8): 7.5 mg via ORAL
  Filled 2015-06-03 (×8): qty 2

## 2015-06-03 MED ORDER — ALBUTEROL SULFATE (2.5 MG/3ML) 0.083% IN NEBU
2.5000 mg | INHALATION_SOLUTION | RESPIRATORY_TRACT | Status: DC | PRN
  Administered 2015-06-03 – 2015-06-05 (×5): 2.5 mg via RESPIRATORY_TRACT
  Filled 2015-06-03 (×5): qty 3

## 2015-06-03 MED ORDER — ALBUTEROL (5 MG/ML) CONTINUOUS INHALATION SOLN
10.0000 mg/h | INHALATION_SOLUTION | Freq: Once | RESPIRATORY_TRACT | Status: AC
Start: 2015-06-03 — End: 2015-06-03

## 2015-06-03 MED ORDER — AZELASTINE HCL 0.1 % NA SOLN
2.0000 | Freq: Two times a day (BID) | NASAL | Status: DC
  Administered 2015-06-03 – 2015-06-06 (×6): 2 via NASAL
  Filled 2015-06-03: qty 30

## 2015-06-03 MED ORDER — METHYLPREDNISOLONE SODIUM SUCC 125 MG IJ SOLR
60.0000 mg | Freq: Four times a day (QID) | INTRAMUSCULAR | Status: DC
  Administered 2015-06-04 – 2015-06-06 (×9): 60 mg via INTRAVENOUS
  Filled 2015-06-03: qty 2
  Filled 2015-06-03: qty 0.96
  Filled 2015-06-03 (×2): qty 2
  Filled 2015-06-03: qty 0.96
  Filled 2015-06-03 (×2): qty 2
  Filled 2015-06-03: qty 0.96
  Filled 2015-06-03: qty 2
  Filled 2015-06-03 (×3): qty 0.96
  Filled 2015-06-03: qty 2
  Filled 2015-06-03 (×2): qty 0.96

## 2015-06-03 MED ORDER — ALBUTEROL (5 MG/ML) CONTINUOUS INHALATION SOLN
INHALATION_SOLUTION | RESPIRATORY_TRACT | Status: AC
  Administered 2015-06-03: 18:00:00
  Filled 2015-06-03: qty 20

## 2015-06-03 MED ORDER — PREGABALIN 50 MG PO CAPS
50.0000 mg | ORAL_CAPSULE | Freq: Two times a day (BID) | ORAL | Status: DC
  Administered 2015-06-03 – 2015-06-06 (×6): 50 mg via ORAL
  Filled 2015-06-03 (×6): qty 1

## 2015-06-03 MED ORDER — CETYLPYRIDINIUM CHLORIDE 0.05 % MT LIQD
7.0000 mL | Freq: Two times a day (BID) | OROMUCOSAL | Status: DC
  Administered 2015-06-04 – 2015-06-05 (×4): 7 mL via OROMUCOSAL

## 2015-06-03 MED ORDER — MORPHINE SULFATE ER 15 MG PO TBCR
15.0000 mg | EXTENDED_RELEASE_TABLET | Freq: Two times a day (BID) | ORAL | Status: DC
  Administered 2015-06-03 – 2015-06-06 (×6): 15 mg via ORAL
  Filled 2015-06-03 (×6): qty 1

## 2015-06-03 MED ORDER — ALPRAZOLAM 0.5 MG PO TABS
1.0000 mg | ORAL_TABLET | Freq: Three times a day (TID) | ORAL | Status: DC
  Administered 2015-06-03 – 2015-06-06 (×8): 1 mg via ORAL
  Filled 2015-06-03 (×8): qty 2

## 2015-06-03 MED ORDER — METHYLPREDNISOLONE SODIUM SUCC 125 MG IJ SOLR
125.0000 mg | Freq: Once | INTRAMUSCULAR | Status: AC
Start: 2015-06-03 — End: 2015-06-03
  Administered 2015-06-03: 125 mg via INTRAVENOUS
  Filled 2015-06-03: qty 2

## 2015-06-03 NOTE — Telephone Encounter (Signed)
Attempted to contact the patient to check on her status and to inquire if her oxygen provider has contacted her. Call placed to # [phone] and a voice mail message was left requesting a call back to # [phone] or [phone].

## 2015-06-03 NOTE — ED Notes (Signed)
Pt straight to Trauma A from triage.  C/o acute onset of wheezing when she opened a cabinet door with mold in it.

## 2015-06-03 NOTE — ED Notes (Signed)
Pt states she doesn't feel any better.  Wrote on a note that her ribs hurt and that she was tired.  Updated resident of same.  No new orders.  RT remains at bedside.

## 2015-06-03 NOTE — ED Notes (Signed)
Intensivist @ bedside

## 2015-06-03 NOTE — ED Provider Notes (Signed)
CSN: [id number]     Arrival date & time 06/03/15  1806 History   First MD Initiated Contact with Patient 06/03/15 1820     No chief complaint on file.  Patient is a 39 y.o. female presenting with shortness of breath.  Shortness of Breath Severity:  Severe Onset quality:  Sudden Timing:  Constant Progression:  Worsening Chronicity:  Recurrent Context: emotional upset and known allergens   Relieved by:  Nothing Ineffective treatments:  None tried Associated symptoms: cough and wheezing   Associated symptoms: no chest pain, no diaphoresis and no fever    Past Medical History  Diagnosis Date  . Asthma   . Anxiety   . COPD (chronic obstructive pulmonary disease) (HCC)   . Bronchitis   . Heart murmur   . Shortness of breath   . Diabetes mellitus without complication (HCC)   . Hypertension    Past Surgical History  Procedure Laterality Date  . Spinal fusion    . Tracheostomy  December 2015   Family History  Problem Relation Age of Onset  . HIV Mother   . Heart disease Father   . CVA Father   . Heart disease Other   . Emphysema Maternal Grandmother     smoked  . Asthma Sister   . Clotting disorder Sister   . Clotting disorder Maternal Grandmother   . Lung cancer Maternal Grandmother     smoked   Social History  Substance Use Topics  . Smoking status: Former Smoker -- 2.00 packs/day for 17 years    Types: Cigarettes    Quit date: 06/15/2014  . Smokeless tobacco: Never Used  . Alcohol Use: 0.0 oz/week    0 Standard drinks or equivalent per week     Comment: occasional   OB History    No data available     Review of Systems  Constitutional: Negative for fever, chills and diaphoresis.  HENT: Negative for congestion.   Respiratory: Positive for cough, shortness of breath and wheezing.   Cardiovascular: Negative for chest pain.  All other systems reviewed and are negative.  Allergies  Robitussin dm; Chocolate; Nsaids; Rayon, purified; and Tramadol  Home  Medications   Prior to Admission medications   Medication Sig Start Date End Date Taking? Authorizing Provider  albuterol (PROAIR HFA) 108 (90 BASE) MCG/ACT inhaler Inhale 2 puffs into the lungs every 6 (six) hours as needed for wheezing or shortness of breath.    Historical Provider, MD  albuterol (PROVENTIL) (2.5 MG/3ML) 0.083% nebulizer solution Take 3 mLs by nebulization every 2 (two) hours as needed for wheezing or shortness of breath. 03/28/15   Meredeth Ide, MD  alprazolam Prudy Feeler) 2 MG tablet Take 0.5 tablets (1 mg total) by mouth 3 (three) times daily. Patient not taking: Reported on 05/29/2015 04/30/15   Ripudeep Jenna Luo, MD  arformoterol (BROVANA) 15 MCG/2ML NEBU Take 2 mLs (15 mcg total) by nebulization 2 (two) times daily. 04/30/15   Ripudeep Jenna Luo, MD  azelastine (ASTELIN) 0.1 % nasal spray Place 2 sprays into both nostrils 2 (two) times daily. Use in each nostril as directed 04/30/15   Ripudeep Jenna Luo, MD  azithromycin (ZITHROMAX) 250 MG tablet 2 tabs(500mg ) on day one then  on days two to five 05/29/15   Jaclyn Shaggy, MD  budesonide (PULMICORT) 0.5 MG/2ML nebulizer solution Take 2 mLs (0.5 mg total) by nebulization 2 (two) times daily. 03/28/15   Meredeth Ide, MD  docusate sodium (COLACE) 100 MG capsule Take  1 capsule (100 mg total) by mouth 2 (two) times daily. Patient not taking: Reported on 05/29/2015 04/30/15   Ripudeep Jenna LuoK Rai, MD  ENSURE (ENSURE) Take 237 mLs by mouth 3 (three) times daily between meals.    Historical Provider, MD  FLUoxetine (PROZAC) 40 MG capsule Take 1 capsule (40 mg total) by mouth daily. Patient not taking: Reported on 05/29/2015 04/30/15   Ripudeep Jenna LuoK Rai, MD  fluticasone (FLONASE) 50 MCG/ACT nasal spray Place 2 sprays into both nostrils 2 (two) times daily. 04/30/15   Ripudeep Jenna LuoK Rai, MD  ipratropium-albuterol (DUONEB) 0.5-2.5 (3) MG/3ML SOLN Take 3 mLs by nebulization every 4 (four) hours as needed (shortness of breath/wheezing).  10/22/14   Historical Provider, MD   morphine (MS CONTIN) 30 MG 12 hr tablet Take 1 tablet (30 mg total) by mouth every 12 (twelve) hours. Patient not taking: Reported on 05/29/2015 04/30/15   Ripudeep Jenna LuoK Rai, MD  oxyCODONE (ROXICODONE) 15 MG immediate release tablet Take 1 tablet (15 mg total) by mouth every 6 (six) hours as needed. Patient not taking: Reported on 05/29/2015 04/30/15   Ripudeep Jenna LuoK Rai, MD  oxyCODONE (ROXICODONE) 5 MG immediate release tablet Take 1 tablet (5 mg total) by mouth 2 (two) times daily as needed for severe pain. 05/29/15   Jaclyn ShaggyEnobong Amao, MD  pantoprazole (PROTONIX) 40 MG tablet Take 1 tablet (40 mg total) by mouth daily. Patient not taking: Reported on 05/29/2015 03/28/15   Meredeth IdeGagan S Lama, MD  polyethylene glycol (MIRALAX / GLYCOLAX) packet Take 17 g by mouth daily. Patient not taking: Reported on 05/29/2015 04/30/15   Ripudeep Jenna LuoK Rai, MD  pregabalin (LYRICA) 50 MG capsule Take 1 capsule (50 mg total) by mouth 2 (two) times daily. 05/29/15   Jaclyn ShaggyEnobong Amao, MD  PRESCRIPTION MEDICATION Inhale 1 puff into the lungs every 2 (two) hours as needed (shortness of breath/wheezing).    Historical Provider, MD  promethazine-codeine (PHENERGAN WITH CODEINE) 6.25-10 MG/5ML syrup Take 5 mLs by mouth every 12 (twelve) hours as needed for cough. 05/29/15   Jaclyn ShaggyEnobong Amao, MD  QUEtiapine (SEROQUEL) 100 MG tablet Take 1 tablet (100 mg total) by mouth at bedtime. 04/30/15   Ripudeep Jenna LuoK Rai, MD  zolpidem (AMBIEN) 10 MG tablet Take 1 tablet (10 mg total) by mouth at bedtime as needed for sleep. Patient not taking: Reported on 05/29/2015 04/30/15   Ripudeep Jenna LuoK Rai, MD   LMP 05/16/2015 Physical Exam  Constitutional: She is oriented to person, place, and time. She appears well-developed and well-nourished. She appears distressed.  HENT:  Head: Normocephalic.  Stridorous breath sounds that are distractable.   Eyes: Pupils are equal, round, and reactive to light.  Neck: Normal range of motion.  Cardiovascular: Normal rate.    Pulmonary/Chest: Effort normal and breath sounds normal. Stridor present. No respiratory distress.  Minimal bilateral wheezing   Abdominal: Soft. She exhibits no distension. There is no tenderness.  Musculoskeletal: Normal range of motion. She exhibits no edema.  Neurological: She is alert and oriented to person, place, and time.  Skin: Skin is warm. She is not diaphoretic.  Psychiatric:  Agitated and axious  Nursing note and vitals reviewed.   ED Course  Procedures (including critical care time) Labs Review Labs Reviewed  CBC WITH DIFFERENTIAL/PLATELET - Abnormal; Notable for the following:    Hemoglobin 11.7 (*)    MCH 24.8 (*)    RDW 16.1 (*)    Platelets 418 (*)    All other components within normal limits  COMPREHENSIVE  METABOLIC PANEL - Abnormal; Notable for the following:    AST 14 (*)    ALT 10 (*)    All other components within normal limits  I-STAT ARTERIAL BLOOD GAS, ED - Abnormal; Notable for the following:    pO2, Arterial 209.0 (*)    Bicarbonate 25.6 (*)    All other components within normal limits  MRSA PCR SCREENING  CBC  BASIC METABOLIC PANEL  MAGNESIUM  PHOSPHORUS   Imaging Review No results found. I have personally reviewed and evaluated these images and lab results as part of my medical decision-making.   EKG Interpretation   Date/Time:  Wednesday June 03 2015 18:21:00 EDT Ventricular Rate:  82 PR Interval:  180 QRS Duration: 76 QT Interval:  354 QTC Calculation: 413 R Axis:   54 Text Interpretation:  Sinus rhythm Consider left ventricular hypertrophy  No significant change since last tracing Confirmed by GOLDSTON  MD, SCOTT  ([id number]) on 06/03/2015 6:25:12 PM      MDM   Patient is well-known to our service and to pulmonologist who presents emergency department today with similar episodes of wheezing and respiratory distress. 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 She currently presents emergency department today with distractible stridor, minimal bilateral wheezing. Patient was given DuoNeb, Solu-Medrol, magnesium for her wheezing and Ativan for her VCD without improvement. Patient has a clear chest x-ray, satting 100% on room air, non-tachycardic, afebrile with in tidal CO2 showing with excellent waveform and no prolonged expiratory phase. Despite our interventions patient was still adamant about receiving pain management and still appeared to have distractible stridor. Pulmonology was consulted and patient was admitted for further management.  Final diagnoses:  VCD Respiratory Distress Anxiety Wheezing       Deirdre Peer, MD 06/04/15 1610  Pricilla Loveless, MD 06/13/15 [phone]

## 2015-06-03 NOTE — Progress Notes (Signed)
Patient's wife notified of patient being admitted to ICU.

## 2015-06-03 NOTE — H&P (Signed)
Name: Julie Kerr MRN: [id number] DOB: [birth year]    ADMISSION DATE:  06/03/2015 CONSULTATION DATE:  06/03/15  REFERRING MD :  EDP  CHIEF COMPLAINT:  Wheeze  BRIEF PATIENT DESCRIPTION: 39 y.o. F with known VCD, brought to [facility] ED 10/19 for wheeze and cough.  PCCM called for admission.  SIGNIFICANT EVENTS  10/19 - admitted.  STUDIES:  CXR 10/19 - no acute process.   HISTORY OF PRESENT ILLNESS:  Julie Kerr is a 39 y.o. F who is well known to our service due to her hx of VCD, asthma, anxiety.  She presented to [facility] ED 10/19 after she began to wheeze after opening a cabinet in her home that reportedly has mold in it.  She also reports that she had been coughing for the past few days, non-productive.  She has not had any fevers/chills/sweats, chest pain, N/V/D, abd pain.  Of note she had recent admission 04/24/15 through 04/30/15 for VCD flare.  She was treated with BD's, steroids, anxiolytics.  PAST MEDICAL HISTORY :   has a past medical history of Asthma; Anxiety; COPD (chronic obstructive pulmonary disease) (HCC); Bronchitis; Heart murmur; Shortness of breath; Diabetes mellitus without complication (HCC); and Hypertension.  has past surgical history that includes Spinal fusion and Tracheostomy (December 2015). Prior to Admission medications   Medication Sig Start Date End Date Taking? Authorizing Provider  albuterol (PROAIR HFA) 108 (90 BASE) MCG/ACT inhaler Inhale 2 puffs into the lungs every 6 (six) hours as needed for wheezing or shortness of breath.   Yes Historical Provider, MD  albuterol (PROVENTIL) (2.5 MG/3ML) 0.083% nebulizer solution Take 3 mLs by nebulization every 2 (two) hours as needed for wheezing or shortness of breath. 03/28/15  Yes Meredeth Ide, MD  alprazolam Prudy Feeler) 2 MG tablet Take 0.5 tablets (1 mg total) by mouth 3 (three) times daily. Patient taking differently: Take 2 mg by mouth 3 (three) times daily.  04/30/15  Yes Ripudeep Jenna Luo, MD  arformoterol (BROVANA) 15  MCG/2ML NEBU Take 2 mLs (15 mcg total) by nebulization 2 (two) times daily. 04/30/15  Yes Ripudeep Jenna Luo, MD  azelastine (ASTELIN) 0.1 % nasal spray Place 2 sprays into both nostrils 2 (two) times daily. Use in each nostril as directed 04/30/15  Yes Ripudeep K Rai, MD  budesonide (PULMICORT) 0.5 MG/2ML nebulizer solution Take 2 mLs (0.5 mg total) by nebulization 2 (two) times daily. 03/28/15  Yes Meredeth Ide, MD  docusate sodium (COLACE) 100 MG capsule Take 1 capsule (100 mg total) by mouth 2 (two) times daily. Patient taking differently: Take 100 mg by mouth daily as needed for mild constipation or moderate constipation.  04/30/15  Yes Ripudeep Jenna Luo, MD  ENSURE (ENSURE) Take 237 mLs by mouth 3 (three) times daily between meals.   Yes Historical Provider, MD  FLUoxetine (PROZAC) 40 MG capsule Take 1 capsule (40 mg total) by mouth daily. Patient taking differently: Take 40 mg by mouth at bedtime.  04/30/15  Yes Ripudeep Jenna Luo, MD  fluticasone (FLONASE) 50 MCG/ACT nasal spray Place 2 sprays into both nostrils 2 (two) times daily. 04/30/15  Yes Ripudeep K Rai, MD  ipratropium-albuterol (DUONEB) 0.5-2.5 (3) MG/3ML SOLN Take 3 mLs by nebulization every 4 (four) hours as needed (shortness of breath/wheezing).  10/22/14  Yes Historical Provider, MD  mometasone-formoterol (DULERA) 100-5 MCG/ACT AERO Inhale 2 puffs into the lungs 2 (two) times daily as needed for wheezing.   Yes Historical Provider, MD  morphine (MS CONTIN) 30 MG 12  hr tablet Take 1 tablet (30 mg total) by mouth every 12 (twelve) hours. 04/30/15  Yes Ripudeep Jenna Luo, MD  oxyCODONE (ROXICODONE) 15 MG immediate release tablet Take 1 tablet (15 mg total) by mouth every 6 (six) hours as needed. Patient taking differently: Take 15 mg by mouth every 6 (six) hours as needed for pain.  04/30/15  Yes Ripudeep Jenna Luo, MD  pantoprazole (PROTONIX) 40 MG tablet Take 1 tablet (40 mg total) by mouth daily. 03/28/15  Yes Meredeth Ide, MD  pregabalin (LYRICA) 50 MG  capsule Take 1 capsule (50 mg total) by mouth 2 (two) times daily. 05/29/15  Yes Jaclyn Shaggy, MD  promethazine-codeine (PHENERGAN WITH CODEINE) 6.25-10 MG/5ML syrup Take 5 mLs by mouth every 12 (twelve) hours as needed for cough. 05/29/15  Yes Jaclyn Shaggy, MD  QUEtiapine (SEROQUEL) 100 MG tablet Take 1 tablet (100 mg total) by mouth at bedtime. 04/30/15  Yes Ripudeep Jenna Luo, MD  zolpidem (AMBIEN) 10 MG tablet Take 1 tablet (10 mg total) by mouth at bedtime as needed for sleep. Patient taking differently: Take 10 mg by mouth at bedtime.  04/30/15  Yes Ripudeep Jenna Luo, MD  azithromycin (ZITHROMAX) 250 MG tablet 2 tabs(500mg ) on day one then  on days two to five Patient not taking: Reported on 06/03/2015 05/29/15   Jaclyn Shaggy, MD  oxyCODONE (ROXICODONE) 5 MG immediate release tablet Take 1 tablet (5 mg total) by mouth 2 (two) times daily as needed for severe pain. Patient not taking: Reported on 06/03/2015 05/29/15   Jaclyn Shaggy, MD  polyethylene glycol (MIRALAX / GLYCOLAX) packet Take 17 g by mouth daily. Patient not taking: Reported on 05/29/2015 04/30/15   Ripudeep Jenna Luo, MD   Allergies  Allergen Reactions  . Robitussin Dm [Dextromethorphan-Guaifenesin] Nausea And Vomiting  . Chocolate Hives  . Nsaids Hives  . Rayon, Purified Hives  . Tramadol Hives    FAMILY HISTORY:  family history includes Asthma in her sister; CVA in her father; Clotting disorder in her maternal grandmother and sister; Emphysema in her maternal grandmother; HIV in her mother; Heart disease in her father and other; Lung cancer in her maternal grandmother. SOCIAL HISTORY:  reports that she quit smoking about a year ago. Her smoking use included Cigarettes. She has a 34 pack-year smoking history. She has never used smokeless tobacco. She reports that she drinks alcohol. She reports that she does not use illicit drugs.  REVIEW OF SYSTEMS:  Unattainable, patient is too SOB to speak.  SUBJECTIVE: SOB.  VITAL  SIGNS: Temp:  [97.9 F (36.6 C)] 97.9 F (36.6 C) (10/19 1830) Pulse Rate:  [60-115] 110 (10/19 2031) Resp:  [22-27] 26 (10/19 2031) BP: (95-143)/(62-114) 114/93 mmHg (10/19 2031) SpO2:  [99 %-100 %] 100 % (10/19 2031)  PHYSICAL EXAMINATION: General: Acutely appearing female in "severe" respiratory distress. Neuro: Alert and interactive, moving all ext to command. HEENT: Agar/AT, PERRL, EOM-I and MMM, coughing frequently. Cardiovascular: RRR, Nl S1/S2, -M/R/G. Lungs: Inhalation and exhalation stridor, unwilling to do any exercises to help with "stridor" Abdomen: Soft, NT, ND and +BS. Musculoskeletal: -edema and -tenderness. Skin: Intact.  Recent Labs Lab 06/03/15 1841  NA 139  K 4.1  CL 104  CO2 24  BUN 9  CREATININE 0.71  GLUCOSE 85   Recent Labs Lab 06/03/15 1841  HGB 11.7*  HCT 37.2  WBC 10.5  PLT 418*   Dg Chest Portable 1 View  06/03/2015  CLINICAL DATA:  Respiratory distress, former smoker, asthma,  COPD, hypertension, diabetes mellitus EXAM: PORTABLE CHEST 1 VIEW COMPARISON:  Portable exam 1836 hours compared to 04/27/2015 FINDINGS: Upper normal size of cardiac silhouette. Mediastinal contours and pulmonary vascularity normal. Mild basilar hypoinflation. No pulmonary infiltrate, pleural effusion or pneumothorax. Bones unremarkable. IMPRESSION: Basilar hypoinflation without acute infiltrate. Electronically Signed   By: Ulyses SouthwardMark  Boles M.D.   On: 06/03/2015 18:42   I reviewed CXR myself, no acute disease, hypoinflation noted.  ASSESSMENT / PLAN:  39 year old female with "VCD" history, chronically on narcotics and benzos at home, frequent admissions for "stridor" to the point that she was trached in the past and has had multiple intubation.  Cords have been looked at from above prior and were normal.  Decannulated in may.  Acute respiratory failure due to "VCD"  - No intubation for now.  - Make BiPAP available.  - Admit to the ICU for close monitoring.  Hypoxemia:  likely will develop negative pulmonary edema  - Titrate O2 for sat.  - Lasix as ordered.  VCD: likely a cry for narcotic and benzos as the patient has had a full work up.  - Monitor in the ICU as she has done this in the past to the point of requiring intubation and even had a trach placed.  - Will likely need psych to see in AM.  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  - No need for ENT at this time.  Chronic pain and benzo use.  - Will use have dose only at this time.  - Monitor for withdrawal.  The patient is critically ill with multiple organ systems failure and requires high complexity decision making for assessment and support, frequent evaluation and titration of therapies, application of advanced monitoring technologies and extensive interpretation of multiple databases.   Critical Care Time devoted to patient care services described in this note is  35  Minutes. This time reflects time of care of this signee Dr Koren BoundWesam Izzie Geers. This critical care time does not reflect procedure time, or teaching time or supervisory time of PA/NP/Med student/Med Resident etc but could involve care discussion time.  Alyson ReedyWesam G. Aariz Maish, M.D. [facility]. Pager: [phone]. After hours pager: [phone].  06/03/2015, 8:47 PM

## 2015-06-03 NOTE — Telephone Encounter (Signed)
Call received from the patient.  She said that she did [facility] regarding her [facility] ;but they did not come out to the house yet.  She stated that they ( [facility]) need a pulse ox result before servicing her oxygen because she now has medicare. Informed her that Dr Venetia NightAmao was notified about her cough and question about her cough medicine and Dr Venetia NightAmao stated noted that she was placed on an antibiotic at her last visit - 05/29/15.  [facility] .  The patient stated that she was not aware of the prescription because she had just completed a z-pack prior to her appointment with Dr Venetia NightAmao last week. - Update provided to Dr Venetia NightAmao.   She said that she still is experiencing a lot of coughing.  Instructed her to contact the office if she does not feel that her condition is improving or to seek immediate medical attention in ED/call EMS if she is having difficulty breathing. She was coughing at times during the call but stated that she understood to go to the ED if she is having difficulty breathing or the coughing is not relieved. .Julie Kerr

## 2015-06-04 ENCOUNTER — Inpatient Hospital Stay (HOSPITAL_COMMUNITY): Payer: Medicare Other

## 2015-06-04 DIAGNOSIS — F419 Anxiety disorder, unspecified: Secondary | ICD-10-CM

## 2015-06-04 DIAGNOSIS — R0600 Dyspnea, unspecified: Secondary | ICD-10-CM

## 2015-06-04 LAB — CBC
HCT: 34.9 % — ABNORMAL LOW (ref 36.0–46.0)
HEMOGLOBIN: 10.9 g/dL — AB (ref 12.0–15.0)
MCH: 24.8 pg — AB (ref 26.0–34.0)
MCHC: 31.2 g/dL (ref 30.0–36.0)
MCV: 79.5 fL (ref 78.0–100.0)
Platelets: 398 10*3/uL (ref 150–400)
RBC: 4.39 MIL/uL (ref 3.87–5.11)
RDW: 16.1 % — ABNORMAL HIGH (ref 11.5–15.5)
WBC: 15.4 10*3/uL — ABNORMAL HIGH (ref 4.0–10.5)

## 2015-06-04 LAB — BASIC METABOLIC PANEL
Anion gap: 10 (ref 5–15)
BUN: 9 mg/dL (ref 6–20)
CALCIUM: 9.5 mg/dL (ref 8.9–10.3)
CHLORIDE: 107 mmol/L (ref 101–111)
CO2: 20 mmol/L — ABNORMAL LOW (ref 22–32)
CREATININE: 0.9 mg/dL (ref 0.44–1.00)
GLUCOSE: 154 mg/dL — AB (ref 65–99)
POTASSIUM: 4.7 mmol/L (ref 3.5–5.1)
SODIUM: 137 mmol/L (ref 135–145)

## 2015-06-04 LAB — PHOSPHORUS: Phosphorus: 3.3 mg/dL (ref 2.5–4.6)

## 2015-06-04 LAB — MAGNESIUM: Magnesium: 2.1 mg/dL (ref 1.7–2.4)

## 2015-06-04 LAB — GLUCOSE, CAPILLARY: Glucose-Capillary: 117 mg/dL — ABNORMAL HIGH (ref 65–99)

## 2015-06-04 MED ORDER — MORPHINE SULFATE (PF) 2 MG/ML IV SOLN
2.0000 mg | Freq: Once | INTRAVENOUS | Status: AC
  Administered 2015-06-04: 2 mg via INTRAVENOUS

## 2015-06-04 MED ORDER — INFLUENZA VAC SPLIT QUAD 0.5 ML IM SUSY
0.5000 mL | PREFILLED_SYRINGE | INTRAMUSCULAR | Status: DC
  Filled 2015-06-04: qty 0.5

## 2015-06-04 MED ORDER — MORPHINE SULFATE (PF) 2 MG/ML IV SOLN
INTRAVENOUS | Status: AC
  Administered 2015-06-04: 2 mg via INTRAVENOUS
  Filled 2015-06-04: qty 1

## 2015-06-04 MED ORDER — MORPHINE SULFATE (PF) 4 MG/ML IV SOLN
4.0000 mg | INTRAVENOUS | Status: AC
  Administered 2015-06-04: 4 mg via INTRAVENOUS

## 2015-06-04 MED ORDER — MORPHINE SULFATE (PF) 4 MG/ML IV SOLN
INTRAVENOUS | Status: AC
  Administered 2015-06-04: 4 mg via INTRAVENOUS
  Filled 2015-06-04: qty 1

## 2015-06-04 MED ORDER — PNEUMOCOCCAL VAC POLYVALENT 25 MCG/0.5ML IJ INJ
0.5000 mL | INJECTION | INTRAMUSCULAR | Status: AC
  Administered 2015-06-06: 0.5 mL via INTRAMUSCULAR
  Filled 2015-06-04: qty 0.5

## 2015-06-04 NOTE — ED Provider Notes (Cosign Needed)
CSN: [id number]     Arrival date & time 10/07/14  2032 History   First MD Initiated Contact with Patient 10/07/14 2037     Chief Complaint  Patient presents with  . Shortness of Breath  . Chest Pain  . Abdominal Pain   Patient is a 39 y.o. female presenting with shortness of breath, chest pain, and abdominal pain.  Shortness of Breath Severity:  Severe Onset quality:  Sudden Timing:  Constant Progression:  Worsening Chronicity:  Recurrent Context: emotional upset and known allergens   Associated symptoms: cough and wheezing   Associated symptoms: no abdominal pain, no chest pain, no diaphoresis, no fever and no vomiting   Chest Pain Associated symptoms: cough and shortness of breath   Associated symptoms: no abdominal pain, no diaphoresis, no fever and not vomiting   Abdominal Pain Associated symptoms: cough and shortness of breath   Associated symptoms: no chest pain, no fever and no vomiting     Past Medical History  Diagnosis Date  . Asthma   . Anxiety   . COPD (chronic obstructive pulmonary disease) (HCC)   . Bronchitis   . Heart murmur   . Shortness of breath   . Diabetes mellitus without complication (HCC)   . Hypertension    Past Surgical History  Procedure Laterality Date  . Spinal fusion    . Tracheostomy  December 2015   Family History  Problem Relation Age of Onset  . HIV Mother   . Heart disease Father   . CVA Father   . Heart disease Other   . Emphysema Maternal Grandmother     smoked  . Asthma Sister   . Clotting disorder Sister   . Clotting disorder Maternal Grandmother   . Lung cancer Maternal Grandmother     smoked   Social History  Substance Use Topics  . Smoking status: Former Smoker -- 2.00 packs/day for 17 years    Types: Cigarettes    Quit date: 06/15/2014  . Smokeless tobacco: Never Used  . Alcohol Use: 0.0 oz/week    0 Standard drinks or equivalent per week     Comment: occasional   OB History    No data available      Review of Systems  Constitutional: Negative for fever and diaphoresis.  Respiratory: Positive for cough, shortness of breath and wheezing.   Cardiovascular: Negative for chest pain.  Gastrointestinal: Negative for vomiting and abdominal pain.  All other systems reviewed and are negative.     Allergies  Robitussin dm; Chocolate; Nsaids; Rayon, purified; and Tramadol  Home Medications   Prior to Admission medications   Medication Sig Start Date End Date Taking? Authorizing Provider  albuterol (PROAIR HFA) 108 (90 BASE) MCG/ACT inhaler Inhale 2 puffs into the lungs every 6 (six) hours as needed for wheezing or shortness of breath.    Historical Provider, MD  albuterol (PROVENTIL) (2.5 MG/3ML) 0.083% nebulizer solution Take 3 mLs by nebulization every 2 (two) hours as needed for wheezing or shortness of breath. 03/28/15   Meredeth Ide, MD  alprazolam Prudy Feeler) 2 MG tablet Take 0.5 tablets (1 mg total) by mouth 3 (three) times daily. Patient taking differently: Take 2 mg by mouth 3 (three) times daily.  04/30/15   Ripudeep Jenna Luo, MD  arformoterol (BROVANA) 15 MCG/2ML NEBU Take 2 mLs (15 mcg total) by nebulization 2 (two) times daily. 04/30/15   Ripudeep Jenna Luo, MD  azelastine (ASTELIN) 0.1 % nasal spray Place 2 sprays into both  nostrils 2 (two) times daily. Use in each nostril as directed 04/30/15   Ripudeep Jenna Luo, MD  azithromycin (ZITHROMAX) 250 MG tablet 2 tabs(500mg ) on day one then  on days two to five Patient not taking: Reported on 06/03/2015 05/29/15   Jaclyn Shaggy, MD  budesonide (PULMICORT) 0.5 MG/2ML nebulizer solution Take 2 mLs (0.5 mg total) by nebulization 2 (two) times daily. 03/28/15   Meredeth Ide, MD  docusate sodium (COLACE) 100 MG capsule Take 1 capsule (100 mg total) by mouth 2 (two) times daily. Patient taking differently: Take 100 mg by mouth daily as needed for mild constipation or moderate constipation.  04/30/15   Ripudeep Jenna Luo, MD  ENSURE (ENSURE) Take 237 mLs by  mouth 3 (three) times daily between meals.    Historical Provider, MD  FLUoxetine (PROZAC) 40 MG capsule Take 1 capsule (40 mg total) by mouth daily. Patient taking differently: Take 40 mg by mouth at bedtime.  04/30/15   Ripudeep Jenna Luo, MD  fluticasone (FLONASE) 50 MCG/ACT nasal spray Place 2 sprays into both nostrils 2 (two) times daily. 04/30/15   Ripudeep Jenna Luo, MD  ipratropium-albuterol (DUONEB) 0.5-2.5 (3) MG/3ML SOLN Take 3 mLs by nebulization every 4 (four) hours as needed (shortness of breath/wheezing).  10/22/14   Historical Provider, MD  mometasone-formoterol (DULERA) 100-5 MCG/ACT AERO Inhale 2 puffs into the lungs 2 (two) times daily as needed for wheezing.    Historical Provider, MD  morphine (MS CONTIN) 30 MG 12 hr tablet Take 1 tablet (30 mg total) by mouth every 12 (twelve) hours. 04/30/15   Ripudeep Jenna Luo, MD  oxyCODONE (ROXICODONE) 15 MG immediate release tablet Take 1 tablet (15 mg total) by mouth every 6 (six) hours as needed. Patient taking differently: Take 15 mg by mouth every 6 (six) hours as needed for pain.  04/30/15   Ripudeep Jenna Luo, MD  oxyCODONE (ROXICODONE) 5 MG immediate release tablet Take 1 tablet (5 mg total) by mouth 2 (two) times daily as needed for severe pain. Patient not taking: Reported on 06/03/2015 05/29/15   Jaclyn Shaggy, MD  pantoprazole (PROTONIX) 40 MG tablet Take 1 tablet (40 mg total) by mouth daily. 03/28/15   Meredeth Ide, MD  polyethylene glycol (MIRALAX / GLYCOLAX) packet Take 17 g by mouth daily. Patient not taking: Reported on 05/29/2015 04/30/15   Ripudeep Jenna Luo, MD  pregabalin (LYRICA) 50 MG capsule Take 1 capsule (50 mg total) by mouth 2 (two) times daily. 05/29/15   Jaclyn Shaggy, MD  promethazine-codeine (PHENERGAN WITH CODEINE) 6.25-10 MG/5ML syrup Take 5 mLs by mouth every 12 (twelve) hours as needed for cough. 05/29/15   Jaclyn Shaggy, MD  QUEtiapine (SEROQUEL) 100 MG tablet Take 1 tablet (100 mg total) by mouth at bedtime. 04/30/15   Ripudeep Jenna Luo, MD  zolpidem (AMBIEN) 10 MG tablet Take 1 tablet (10 mg total) by mouth at bedtime as needed for sleep. Patient taking differently: Take 10 mg by mouth at bedtime.  04/30/15   Ripudeep Jenna Luo, MD   BP 110/67 mmHg  Pulse 98  Temp(Src) 99.3 F (37.4 C) (Oral)  Resp 16  Ht  (1.575 m)  Wt 184 lb 1.4 oz (83.5 kg)  BMI 33.66 kg/m2  SpO2 100%  LMP 09/30/2014 Physical Exam  Constitutional: She is oriented to person, place, and time. She appears well-developed and well-nourished. She appears distressed.  HENT:  Stridorous breath sounds that appear forced and inconsistent with edema as stridor disappears  when patient is distracted.   Eyes: Pupils are equal, round, and reactive to light.  Neck: Normal range of motion. No JVD present.  Cardiovascular: Normal rate.   No murmur heard. Pulmonary/Chest:  Slight bilateral wheezing  Abdominal: Soft. She exhibits no distension.  Musculoskeletal: Normal range of motion. She exhibits no edema.  Neurological: She is alert and oriented to person, place, and time.  Skin: Skin is warm. She is not diaphoretic. No erythema.  Psychiatric:  Anxious  Nursing note and vitals reviewed.   ED Course  Procedures (including critical care time) Labs Review   Imaging Review Dg Chest Portable 1 View  06/03/2015  CLINICAL DATA:  Respiratory distress, former smoker, asthma, COPD, hypertension, diabetes mellitus EXAM: PORTABLE CHEST 1 VIEW COMPARISON:  Portable exam 1836 hours compared to 04/27/2015 FINDINGS: Upper normal size of cardiac silhouette. Mediastinal contours and pulmonary vascularity normal. Mild basilar hypoinflation. No pulmonary infiltrate, pleural effusion or pneumothorax. Bones unremarkable. IMPRESSION: Basilar hypoinflation without acute infiltrate. Electronically Signed   By: Ulyses SouthwardMark  Boles M.D.   On: 06/03/2015 18:42   I have personally reviewed and evaluated these images and lab results as part of my medical decision-making.   EKG  Interpretation   Date/Time:  Tuesday October 07 2014 20:42:57 EST Ventricular Rate:  79 PR Interval:  172 QRS Duration: 73 QT Interval:  365 QTC Calculation: 418 R Axis:   63 Text Interpretation:  Sinus rhythm Baseline wander in lead(s) V2 V5 V6  Confirmed by OTTER  MD, OLGA ([id number]) on 10/07/2014 11:44:49 PM      MDM   Patient presents emergency department today with acute onset of shortness of breath after opening a Reportedly full of mold. Patient is well known to this department and to the pulmonologist for "VCD." Here she will not comply with exams and her stridor and upper airway appears to be self-induced as she loses stridor when distracted. Patient was placed on waveform capnography which showed 35 CO2 with good waveform without prolonged expiratory phase. She is satting 100% and was non-tachycardic. She states febrile. Patient does have minimal wheezing and for that she was given DuoNeb, Solu-Medrol, magnesium she states that this usually helps her. I also gave multiple rounds of Ativan. At this time we had no clinical indication to intubate her. Patient has had a significant history of tracheostomy and multiple intubations for some more episodes. Patient was not hypoxic. She continues to ask for pain medicine and has done so in the past. She has chronic pain problems. Patient was admitted to critical care for her VCD in respiratory distress.   Final diagnoses:  Urinary tract infection without hematuria, site unspecified  Vocal cord dysfunction         Deirdre PeerJeremiah Pier Bosher, MD 06/04/15 [phone]

## 2015-06-04 NOTE — Progress Notes (Signed)
eLink Physician-Brief Progress Note Patient Name: Julie Kerr DOB: 06/08/1976 MRN: [id number]   Date of Service  06/04/2015  HPI/Events of Note  Now eating a PO diet.   eICU Interventions  Will D/C IV fluid.     Intervention Category Minor Interventions: Routine modifications to care plan (e.g. PRN medications for pain, fever)  Julie Kerr 06/04/2015, 9:52 PM

## 2015-06-04 NOTE — H&P (Signed)
Name: Julie Kerr MRN: [id number] DOB: 28-Dec-1975    ADMISSION DATE:  06/03/2015 CONSULTATION DATE:  06/03/15  REFERRING MD :  EDP  CHIEF COMPLAINT:  Wheeze  BRIEF PATIENT DESCRIPTION:    Julie Kerr is a 39 y.o. F who is well known to our service due to her hx of VCD, asthma, anxiety.  She presented to [facility] ED 10/19 after she began to wheeze after opening a cabinet in her home that reportedly has mold in it.  She also reports that she had been coughing for the past few days, non-productive.  She has not had any fevers/chills/sweats, chest pain, N/V/D, abd pain.  Of note she had recent admission 04/24/15 through 04/30/15 for VCD flare.  She was treated with BD's, steroids, anxiolytics.    SIGNIFICANT EVENTS  10/19 - admitted. CXR 10/19 - no acute process.   SUBJECTIVE:   06/04/2015: Had one episode of acute dyspnea with wheezing earlier. Demanded morphine. I gave her 4 mg of morphine and then she improved. After that she reports she is hungry and wants to eat. She has many questions most of it involved around getting a back pain specialist and getting different opioids and benzos prescribed either now or discharge  VITAL SIGNS: Temp:  [97.8 F (36.6 C)-99.2 F (37.3 C)] 97.8 F (36.6 C) (10/20 0818) Pulse Rate:  [60-115] 97 (10/20 1300) Resp:  [15-30] 25 (10/20 1300) BP: (95-143)/(53-114) 124/70 mmHg (10/20 1300) SpO2:  [99 %-100 %] 100 % (10/20 1300) FiO2 (%):  [0 %-100 %] 100 % (10/19 2155) Weight:  [87.1 kg (192 lb 0.3 oz)] 87.1 kg (192 lb 0.3 oz) (10/20 0325)  PHYSICAL EXAMINATION: General: Acutely appearing female in "severe" respiratory distress.-> After morphine settled down and started sleeping Neuro: Alert and interactive, moving all ext to command. HEENT: [place]/AT, PERRL, EOM-I and MMM, coughing frequently. Cardiovascular: RRR, Nl S1/S2, -M/R/G. Lungs: Inhalation and exhalation stridor, unwilling to do any exercises to help with "stridor". Forced expiratory wheeze ->  after morphine improved Abdomen: Soft, NT, ND and +BS. Musculoskeletal: -edema and -tenderness. Skin: Intact.  PULMONARY  Recent Labs Lab 06/03/15 1835  PHART 7.428  PCO2ART 38.8  PO2ART 209.0*  HCO3 25.6*  TCO2 27  O2SAT 100.0    CBC  Recent Labs Lab 06/03/15 1841 06/04/15 0245  HGB 11.7* 10.9*  HCT 37.2 34.9*  WBC 10.5 15.4*  PLT 418* 398    COAGULATION No results for input(s): INR in the last 168 hours.  CARDIAC  No results for input(s): TROPONINI in the last 168 hours. No results for input(s): PROBNP in the last 168 hours.   CHEMISTRY  Recent Labs Lab 06/03/15 1841 06/04/15 0245  NA 139 137  K 4.1 4.7  CL 104 107  CO2 24 20*  GLUCOSE 85 154*  BUN 9 9  CREATININE 0.71 0.90  CALCIUM 10.2 9.5  MG  --  2.1  PHOS  --  3.3   Estimated Creatinine Clearance: 87.7 mL/min (by C-G formula based on Cr of 0.9).   LIVER  Recent Labs Lab 06/03/15 1841  AST 14*  ALT 10*  ALKPHOS 51  BILITOT 0.3  PROT 7.1  ALBUMIN 3.7     INFECTIOUS No results for input(s): LATICACIDVEN, PROCALCITON in the last 168 hours.   ENDOCRINE CBG (last 3)   Recent Labs  06/04/15 0826  GLUCAP 117*         IMAGING x48h  - image(s) personally visualized  -   highlighted in bold Dg Chest  Port 1 View  06/04/2015  CLINICAL DATA:  Respiratory failure . EXAM: PORTABLE CHEST 1 VIEW COMPARISON:  06/03/2015 . FINDINGS: Mediastinum hilar structures are normal. Cardiomegaly normal pulmonary vascularity. Low lung volumes with mild bibasilar atelectasis. Tiny left pleural effusion cannot be excluded. No pneumothorax. IMPRESSION: 1. Stable cardiomegaly. 2. Persistent low lung volumes with bibasilar atelectasis. Tiny left pleural effusion cannot be excluded . Electronically Signed   By: Maisie Fushomas  Register   On: 06/04/2015 07:19   Dg Chest Portable 1 View  06/03/2015  CLINICAL DATA:  Respiratory distress, former smoker, asthma, COPD, hypertension, diabetes mellitus EXAM:  PORTABLE CHEST 1 VIEW COMPARISON:  Portable exam 1836 hours compared to 04/27/2015 FINDINGS: Upper normal size of cardiac silhouette. Mediastinal contours and pulmonary vascularity normal. Mild basilar hypoinflation. No pulmonary infiltrate, pleural effusion or pneumothorax. Bones unremarkable. IMPRESSION: Basilar hypoinflation without acute infiltrate. Electronically Signed   By: Ulyses SouthwardMark  Boles M.D.   On: 06/03/2015 18:42       ASSESSMENT / PLAN:  39 year old female with "VCD" history, chronically on narcotics and benzos at home, frequent admissions for "stridor" to the point that she was trached in the past and has had multiple intubation.  Cords have been looked at from above prior and were normal.  Decannulated in may.  06/04/2015: Problems persist. Requiring morphine to control her respiratory attacks. She insists she wants to eat   Acute respiratory failure due to "VCD"  - No intubation for now.  - Make BiPAP available.  - Continue  ICU.  Hypoxemia: likely will develop negative pulmonary edema  - Titrate O2 for sat.  - Lasix as ordered.  VCD: likely a cry for narcotic and benzos as the patient has had a full work up.  - Monitor in the ICU as she has done this in the past to the point of requiring intubation and even had a trach placed.  - Will likely need psych to see in AM. Will call Dr. 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  - No need for ENT at this time.  Chronic pain and benzo use.  - As needed  - Monitor for withdrawal.    Dr. Kalman ShanMurali Anavi Branscum, M.D., [facility]C.P Pulmonary and Critical Care Medicine Staff Physician [facility] and [facility] Pager: [phone], If no answer or between  15:00h - 7:00h: call [phone]  06/04/2015 2:30 PM

## 2015-06-04 NOTE — Progress Notes (Signed)
eLink Physician-Brief Progress Note Patient Name: Adele Schildermanda Ferrelli DOB: 05/18/1976 MRN: [id number]   Date of Service  06/04/2015  HPI/Events of Note  Episode of VCD. Appears to have responded to Morphine in the past.   eICU Interventions  Will order Morphine 2 mg IV now.      Intervention Category Intermediate Interventions: Respiratory distress - evaluation and management  Michele Kerlin Eugene 06/04/2015, 5:28 PM

## 2015-06-04 NOTE — Progress Notes (Signed)
Pt had a episode of wheezing and anxiety.  Pt unable to catch breath.  Called [facility] and spoke to Dr. Arsenio LoaderSommer.  New orders received.

## 2015-06-05 ENCOUNTER — Encounter (HOSPITAL_COMMUNITY): Payer: Self-pay | Admitting: *Deleted

## 2015-06-05 LAB — CBC WITH DIFFERENTIAL/PLATELET
BASOS PCT: 0 %
Basophils Absolute: 0 10*3/uL (ref 0.0–0.1)
EOS PCT: 0 %
Eosinophils Absolute: 0 10*3/uL (ref 0.0–0.7)
HEMATOCRIT: 31.2 % — AB (ref 36.0–46.0)
HEMOGLOBIN: 9.7 g/dL — AB (ref 12.0–15.0)
LYMPHS PCT: 4 %
Lymphs Abs: 1.1 10*3/uL (ref 0.7–4.0)
MCH: 24.9 pg — AB (ref 26.0–34.0)
MCHC: 31.1 g/dL (ref 30.0–36.0)
MCV: 80 fL (ref 78.0–100.0)
Monocytes Absolute: 0.8 10*3/uL (ref 0.1–1.0)
Monocytes Relative: 3 %
NEUTROS PCT: 93 %
Neutro Abs: 26.2 10*3/uL — ABNORMAL HIGH (ref 1.7–7.7)
Platelets: 372 10*3/uL (ref 150–400)
RBC: 3.9 MIL/uL (ref 3.87–5.11)
RDW: 16.6 % — ABNORMAL HIGH (ref 11.5–15.5)
WBC: 28.1 10*3/uL — ABNORMAL HIGH (ref 4.0–10.5)

## 2015-06-05 LAB — PHOSPHORUS: Phosphorus: 2.5 mg/dL (ref 2.5–4.6)

## 2015-06-05 LAB — LACTIC ACID, PLASMA: LACTIC ACID, VENOUS: 3.2 mmol/L — AB (ref 0.5–2.0)

## 2015-06-05 LAB — BASIC METABOLIC PANEL
Anion gap: 8 (ref 5–15)
BUN: 9 mg/dL (ref 6–20)
CHLORIDE: 101 mmol/L (ref 101–111)
CO2: 23 mmol/L (ref 22–32)
CREATININE: 0.81 mg/dL (ref 0.44–1.00)
Calcium: 9 mg/dL (ref 8.9–10.3)
Glucose, Bld: 235 mg/dL — ABNORMAL HIGH (ref 65–99)
Potassium: 4.2 mmol/L (ref 3.5–5.1)
Sodium: 132 mmol/L — ABNORMAL LOW (ref 135–145)

## 2015-06-05 LAB — TROPONIN I: Troponin I: <0.03 ng/mL (ref <0.031)

## 2015-06-05 LAB — MAGNESIUM: Magnesium: 2 mg/dL (ref 1.7–2.4)

## 2015-06-05 NOTE — Progress Notes (Signed)
Report called to RN at [facility] prior to transfer to unit. Pt transferred in bed, belongings and meds transferred with patient. Pt recently received breathing treatment with resolvance of wheezing and states she is breathing better. No distress or complaints.

## 2015-06-05 NOTE — Clinical Social Work Note (Signed)
CSW received consult for patient needing to talk to Education officer, museum.  CSW met with patient, she was asking questions about where she can receive mental health care with her insurance, and transportation assistance.  CSW gave her information about [facility] for mental health needs, and also gave her an application for [employer] in order to use public transportation.  Patient requested to see psychiatrist, CSW asked bedside nurse to discuss with physician.  CSW to sign off please reconsult if other social work needs arise.  Jones Broom. [place], MSW, [place] 06/05/2015 6:37 PM

## 2015-06-05 NOTE — Progress Notes (Signed)
Critical lactic acid reported to [facility] MD  

## 2015-06-05 NOTE — Progress Notes (Signed)
Hypoglycemic Event  CBG: 57  Treatment: D50 IV 50 mL  Symptoms: None  Follow-up CBG: [id number] CBG Result:112  Possible Reasons for Event: Inadequate Meal Intake     Messina Kosinski S

## 2015-06-05 NOTE — Progress Notes (Signed)
Name: Julie Kerr MRN: [id number] DOB: 12/25/1975    ADMISSION DATE:  06/03/2015 CONSULTATION DATE:  06/03/15  REFERRING MD :  EDP  CHIEF COMPLAINT:  Wheeze  BRIEF PATIENT DESCRIPTION:   Julie Schildermanda Loewenstein is a 39 y.o. F who is well known to our service due to her hx of VCD, asthma, anxiety.  [facility] after she began to wheeze after opening a cabinet in her home that reportedly has mold in it.  She also reports that she had been coughing for the past few days, non-productive.  She has not had any fevers/chills/sweats, chest pain, N/V/D, abd pain.  Of note she had recent admission 04/24/15 through 04/30/15 for VCD flare.  She was treated with BD's, steroids, anxiolytics.     SIGNIFICANT EVENTS  10/19 - admitted. CXR 10/19 - no acute process.   SUBJECTIVE:  06/05/2015: Pt reports she is "feeling worse" and her chronic pain is "out of control" Pain is located in back and all joints. She states she is currently having back pain, chest pain, shortness of breath, and anxiety. She has a list of medications that she would like the hospital to prescribe for her now and upon discharge.   VITAL SIGNS: Temp:  [97.7 F (36.5 C)-98.7 F (37.1 C)] 98.2 F (36.8 C) (10/21 0838) Pulse Rate:  [86-113] 90 (10/21 0400) Resp:  [16-26] 16 (10/21 0400) BP: (93-135)/(43-85) 111/62 mmHg (10/21 0400) SpO2:  [97 %-100 %] 100 % (10/21 0939) Weight:  [86.7 kg (191 lb 2.2 oz)] 86.7 kg (191 lb 2.2 oz) (10/21 0500)  PHYSICAL EXAMINATION: General: Alert female resting in bed in NAD Neuro: Alert and oriented x4. No focal deficits  HEENT: [place]/AT, PERRL Cardiovascular: RRR, Nl S1/S2,  Lungs: Inhalation and exhalation stridor in upper airway  Abdomen: Soft, NT, ND and +BS. Musculoskeletal: -edema and -tenderness. Skin: Intact.  PULMONARY  Recent Labs Lab 06/03/15 1835  PHART 7.428  PCO2ART 38.8  PO2ART 209.0*  HCO3 25.6*  TCO2 27  O2SAT 100.0    CBC  Recent Labs Lab 06/03/15 1841  06/04/15 0245 06/05/15 0312  HGB 11.7* 10.9* 9.7*  HCT 37.2 34.9* 31.2*  WBC 10.5 15.4* 28.1*  PLT 418* 398 372    COAGULATION No results for input(s): INR in the last 168 hours.  CARDIAC    Recent Labs Lab 06/05/15 0312  TROPONINI <0.03   No results for input(s): PROBNP in the last 168 hours.   CHEMISTRY  Recent Labs Lab 06/03/15 1841 06/04/15 0245 06/05/15 0312  NA 139 137 132*  K 4.1 4.7 4.2  CL 104 107 101  CO2 24 20* 23  GLUCOSE 85 154* 235*  BUN 9 9 9   CREATININE 0.71 0.90 0.81  CALCIUM 10.2 9.5 9.0  MG  --  2.1 2.0  PHOS  --  3.3 2.5   Estimated Creatinine Clearance: 97.1 mL/min (by C-G formula based on Cr of 0.81).   LIVER  Recent Labs Lab 06/03/15 1841  AST 14*  ALT 10*  ALKPHOS 51  BILITOT 0.3  PROT 7.1  ALBUMIN 3.7     INFECTIOUS  Recent Labs Lab 06/05/15 0312  LATICACIDVEN 3.2*     ENDOCRINE CBG (last 3)   Recent Labs  06/04/15 0826  GLUCAP 117*       IMAGING x48h  - image(s) personally visualized  -   highlighted in bold Dg Chest Port 1 View  06/04/2015  CLINICAL DATA:  Respiratory failure . EXAM: PORTABLE CHEST 1 VIEW  COMPARISON:  06/03/2015 . FINDINGS: Mediastinum hilar structures are normal. Cardiomegaly normal pulmonary vascularity. Low lung volumes with mild bibasilar atelectasis. Tiny left pleural effusion cannot be excluded. No pneumothorax. IMPRESSION: 1. Stable cardiomegaly. 2. Persistent low lung volumes with bibasilar atelectasis. Tiny left pleural effusion cannot be excluded . Electronically Signed   By: Maisie Fus  Register   On: 06/04/2015 07:19   Dg Chest Portable 1 View  06/03/2015  CLINICAL DATA:  Respiratory distress, former smoker, asthma, COPD, hypertension, diabetes mellitus EXAM: PORTABLE CHEST 1 VIEW COMPARISON:  Portable exam 1836 hours compared to 04/27/2015 FINDINGS: Upper normal size of cardiac silhouette. Mediastinal contours and pulmonary vascularity normal. Mild basilar hypoinflation. No  pulmonary infiltrate, pleural effusion or pneumothorax. Bones unremarkable. IMPRESSION: Basilar hypoinflation without acute infiltrate. Electronically Signed   By: Ulyses Southward M.D.   On: 06/03/2015 18:42       ASSESSMENT / PLAN:  39 year old female with "VCD" history, chronically on narcotics and benzos at home, frequent admissions for "stridor" to the point that she was trached in the past and has had multiple intubation.  Cords have been looked at from above prior and were normal.  Decannulated in may.  06/04/2015: Problems persist. Requiring morphine to control her respiratory attacks. She insists she wants to eat   Acute respiratory failure due to "VCD"  - No intubation for now.  - Make BiPAP available PRN.  - Continue  ICU.  Hypoxemia: likely will develop negative pulmonary edema  - Titrate O2 for sat.  - Lasix as ordered.  VCD: likely a cry for narcotic and benzos as the patient has had a full work up.  - Monitor in the ICU as she has done this in the past to the point of requiring intubation and even had a trach placed.  - Will likely need psych to see 10/21.  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  - No need for ENT at this time.  Chronic pain and benzo use.  - As needed  - Monitor for withdrawal.

## 2015-06-06 DIAGNOSIS — J9621 Acute and chronic respiratory failure with hypoxia: Secondary | ICD-10-CM | POA: Diagnosis not present

## 2015-06-06 LAB — CBC WITH DIFFERENTIAL/PLATELET
BASOS ABS: 0 10*3/uL (ref 0.0–0.1)
Basophils Relative: 0 %
EOS PCT: 0 %
Eosinophils Absolute: 0 10*3/uL (ref 0.0–0.7)
HEMATOCRIT: 31.8 % — AB (ref 36.0–46.0)
Hemoglobin: 9.9 g/dL — ABNORMAL LOW (ref 12.0–15.0)
LYMPHS ABS: 1.1 10*3/uL (ref 0.7–4.0)
LYMPHS PCT: 4 %
MCH: 25.1 pg — ABNORMAL LOW (ref 26.0–34.0)
MCHC: 31.1 g/dL (ref 30.0–36.0)
MCV: 80.5 fL (ref 78.0–100.0)
MONOS PCT: 3 %
Monocytes Absolute: 0.8 10*3/uL (ref 0.1–1.0)
NEUTROS PCT: 93 %
Neutro Abs: 24.7 10*3/uL — ABNORMAL HIGH (ref 1.7–7.7)
Platelets: 378 10*3/uL (ref 150–400)
RBC: 3.95 MIL/uL (ref 3.87–5.11)
RDW: 16.8 % — AB (ref 11.5–15.5)
WBC: 26.6 10*3/uL — AB (ref 4.0–10.5)

## 2015-06-06 LAB — PHOSPHORUS: Phosphorus: 2 mg/dL — ABNORMAL LOW (ref 2.5–4.6)

## 2015-06-06 LAB — MAGNESIUM: Magnesium: 1.9 mg/dL (ref 1.7–2.4)

## 2015-06-06 MED ORDER — MOMETASONE FURO-FORMOTEROL FUM 100-5 MCG/ACT IN AERO: 2.0000 | INHALATION_SPRAY | Freq: Two times a day (BID) | RESPIRATORY_TRACT | Status: DC | PRN

## 2015-06-06 MED ORDER — ALPRAZOLAM 1 MG PO TABS: 1.0000 mg | ORAL_TABLET | Freq: Three times a day (TID) | ORAL | Status: DC | PRN

## 2015-06-06 MED ORDER — FLUOXETINE HCL 40 MG PO CAPS: 40.0000 mg | ORAL_CAPSULE | Freq: Every day | ORAL | Status: DC

## 2015-06-06 MED ORDER — QUETIAPINE FUMARATE 100 MG PO TABS: 100.0000 mg | ORAL_TABLET | Freq: Every day | ORAL | Status: DC

## 2015-06-06 MED ORDER — PROMETHAZINE-CODEINE 6.25-10 MG/5ML PO SYRP: 5.0000 mL | ORAL_SOLUTION | Freq: Two times a day (BID) | ORAL | Status: DC | PRN

## 2015-06-06 MED ORDER — PANTOPRAZOLE SODIUM 40 MG PO TBEC: 40.0000 mg | DELAYED_RELEASE_TABLET | Freq: Every day | ORAL | Status: DC

## 2015-06-06 MED ORDER — OXYCODONE HCL 5 MG PO TABS
5.0000 mg | ORAL_TABLET | Freq: Once | ORAL | Status: AC
  Administered 2015-06-06: 5 mg via ORAL
  Filled 2015-06-06: qty 1

## 2015-06-06 MED ORDER — OXYCODONE-ACETAMINOPHEN 5-325 MG PO TABS
1.0000 | ORAL_TABLET | Freq: Three times a day (TID) | ORAL | Status: DC | PRN
Start: 2015-06-06 — End: 2015-06-12

## 2015-06-06 MED ORDER — ALBUTEROL SULFATE HFA 108 (90 BASE) MCG/ACT IN AERS: 2.0000 | INHALATION_SPRAY | Freq: Four times a day (QID) | RESPIRATORY_TRACT | Status: DC | PRN

## 2015-06-06 NOTE — Care Management Note (Signed)
Case Management Note  Patient Details  Name: Julie Kerr MRN: [id number] Date of Birth: 04/20/1976  Subjective/Objective:                  CHIEF COMPLAINT: Wheeze hx of VCD, asthma, anxiety  Action/Plan: CM spoke to patient at the bedside who is planning to be discharged home with family. Pt denies medication needs at this time. Pt had a question about obtaining nebulizer mask. CM advised that pt can take home the nebulizer mask from the hospital which pt states is compatible with her home machine. Patient gets her home oxygen through [employer]. CM [facility] PaulaJeff [facility] @ [phone] and asked that 5 nebulizer masks be delivered to the pt room. [facility]. Pt states that [facility] and feels those services are still needed. CM paged and spoke with Dr. Lendell CapriceSullivan for orders to be entered along with face to face. CM offered choice HH agency and [facility]. CM called Fara Chuteiffany Clayton @ [phone] with [facility] to [facility]. No further CM needs communicated and CM remains available should additional needs arise.    Expected Discharge Date:  06/06/15            Expected Discharge Plan:  Home w Home Health Services  In-House Referral:     Discharge planning Services  CM Consult  Post Acute Care Choice:  Durable Medical Equipment, Home Health Choice offered to:     DME Arranged:   (Nebulizer masks) DME Agency:  [facility]  HH Arranged[facility]  [facility]  [facility]  Status of Service:  In process, will continue to follow  Medicare Important Message Given:    Date Medicare IM Given:    Medicare IM give by:    Date Additional Medicare IM Given:    Additional Medicare Important Message give by:     If discussed at Long Length of Stay Meetings, dates discussed:    Additional Comments:  Darcel SmallingAnna C Mylik Pro, RN 06/06/2015, 11:46 AM

## 2015-06-06 NOTE — Progress Notes (Signed)
eLink Physician-Brief Progress Note Patient Name: Julie Kerr Son DOB: 01/15/1976 MRN: [id number]   Date of Service  06/06/2015  HPI/Events of Note  Patient c/o back pain. Patient is already on MS Contin and Oxycodone.   eICU Interventions  Will order a one time dose of Oxycodone 5 mg PO.      Intervention Category Intermediate Interventions: Pain - evaluation and management  Sommer,Steven Eugene 06/06/2015, 6:07 AM

## 2015-06-06 NOTE — Progress Notes (Signed)
[facility]

## 2015-06-06 NOTE — Progress Notes (Signed)
Pt discharged to home.  Discharge instructions explained to pt.  Pt has no questions at the time of discharge.  Pt states she has all belongings.  IV removed.   

## 2015-06-06 NOTE — Discharge Summary (Signed)
Physician Discharge Summary  Julie Kerr WUJ:[id number] DOB: 1976/07/17 DOA: 06/03/2015  PCP: No primary care provider on file.  Admit date: 06/03/2015 Discharge date: 06/06/2015  Time spent: greater than 30 minutes  Recommendations for Outpatient Follow-up:  1. Home RN, aide resumed per request  Discharge Diagnoses:  Active Problems:   Vocal cord dysfunction acute on chronic respiratory failure due to VCD chronic pain anxiety  Discharge Condition: stable  Diet recommendation: carbohydrate modified  Filed Weights   06/04/15 0325 06/05/15 0500 06/06/15 0635  Weight: 87.1 kg (192 lb 0.3 oz) 86.7 kg (191 lb 2.2 oz) 87.2 kg (192 lb 3.9 oz)    History of present illness/Hospital Course:  Julie Kerr is a 39 y.o. hx of VCD, asthma, anxiety. She presented to [facility] ED 10/19 after she began to wheeze after opening a cabinet in her home that reportedly has mold in it. She also reports that she had been coughing for the past few days, non-productive. She has not had any fevers/chills/sweats, chest pain, N/V/D, abd pain.  Initially admitted to critical care service. Started on bronchodilators, steroids, anxiolytics.  PCCM felt some element of drug seeking.  Psychiatry consult recommended and transferred to hospitalists 10/22. Today, patient requesting discharge and refuses psych eval. Denies suicidal or homicidal ideation. Lungs are clear and stable for discharge  Of note she had recent admission 04/24/15 through 04/30/15 for VCD flare. She was treated with BD's, steroids, anxiolytics.  Procedures:  none  Consultations:  PCCM  Discharge Exam: Filed Vitals:   06/06/15 0635  BP: 118/59  Pulse: 69  Temp: 98.9 F (37.2 C)  Resp: 18    General: comfortable. A and o Cardiovascular: RRR Respiratory: CTA  Discharge Instructions   Discharge Instructions    Activity as tolerated - No restrictions    Complete by:  As directed      Diet general    Complete by:  As directed            Current Discharge Medication List    START taking these medications   Details  oxyCODONE-acetaminophen (ROXICET) 5-325 MG tablet Take 1 tablet by mouth every 8 (eight) hours as needed for severe pain. Qty: 15 tablet, Refills: 0    !! pantoprazole (PROTONIX) 40 MG tablet Take 1 tablet (40 mg total) by mouth daily. Qty: 30 tablet, Refills: 0     !! - Potential duplicate medications found. Please discuss with provider.    CONTINUE these medications which have CHANGED   Details  albuterol (PROAIR HFA) 108 (90 BASE) MCG/ACT inhaler Inhale 2 puffs into the lungs every 6 (six) hours as needed for wheezing or shortness of breath. Qty: 1 Inhaler, Refills: 0    ALPRAZolam (XANAX) 1 MG tablet Take 1 tablet (1 mg total) by mouth 3 (three) times daily as needed for anxiety. Qty: 20 tablet, Refills: 0    FLUoxetine (PROZAC) 40 MG capsule Take 1 capsule (40 mg total) by mouth at bedtime. Qty: 30 capsule, Refills: 0    mometasone-formoterol (DULERA) 100-5 MCG/ACT AERO Inhale 2 puffs into the lungs 2 (two) times daily as needed for wheezing. Qty: 1 Inhaler, Refills: 0    promethazine-codeine (PHENERGAN WITH CODEINE) 6.25-10 MG/5ML syrup Take 5 mLs by mouth every 12 (twelve) hours as needed for cough. Qty: 118 mL, Refills: 0    QUEtiapine (SEROQUEL) 100 MG tablet Take 1 tablet (100 mg total) by mouth at bedtime. Qty: 30 tablet, Refills: 0      CONTINUE these medications which have  NOT CHANGED   Details  albuterol (PROVENTIL) (2.5 MG/3ML) 0.083% nebulizer solution Take 3 mLs by nebulization every 2 (two) hours as needed for wheezing or shortness of breath. Qty: 75 mL, Refills: 1    arformoterol (BROVANA) 15 MCG/2ML NEBU Take 2 mLs (15 mcg total) by nebulization 2 (two) times daily. Qty: 120 mL, Refills: 1    azelastine (ASTELIN) 0.1 % nasal spray Place 2 sprays into both nostrils 2 (two) times daily. Use in each nostril as directed Qty: 30 mL, Refills: 2    budesonide (PULMICORT) 0.5  MG/2ML nebulizer solution Take 2 mLs (0.5 mg total) by nebulization 2 (two) times daily. Qty: 2 mL, Refills: 1    docusate sodium (COLACE) 100 MG capsule Take 1 capsule (100 mg total) by mouth 2 (two) times daily. Qty: 60 capsule, Refills: 3    fluticasone (FLONASE) 50 MCG/ACT nasal spray Place 2 sprays into both nostrils 2 (two) times daily. Qty: 16 g, Refills: 3    ipratropium-albuterol (DUONEB) 0.5-2.5 (3) MG/3ML SOLN Take 3 mLs by nebulization every 4 (four) hours as needed (shortness of breath/wheezing).  Refills: 2    morphine (MS CONTIN) 30 MG 12 hr tablet Take 1 tablet (30 mg total) by mouth every 12 (twelve) hours. Qty: 20 tablet, Refills: 0    oxyCODONE (ROXICODONE) 15 MG immediate release tablet Take 1 tablet (15 mg total) by mouth every 6 (six) hours as needed. Qty: 20 tablet, Refills: 0    !! pantoprazole (PROTONIX) 40 MG tablet Take 1 tablet (40 mg total) by mouth daily. Qty: 30 tablet, Refills: 1    pregabalin (LYRICA) 50 MG capsule Take 1 capsule (50 mg total) by mouth 2 (two) times daily. Qty: 60 capsule, Refills: 1    zolpidem (AMBIEN) 10 MG tablet Take 1 tablet (10 mg total) by mouth at bedtime as needed for sleep. Qty: 30 tablet, Refills: 0     !! - Potential duplicate medications found. Please discuss with provider.    STOP taking these medications     ENSURE (ENSURE)      azithromycin (ZITHROMAX) 250 MG tablet      polyethylene glycol (MIRALAX / GLYCOLAX) packet        Allergies  Allergen Reactions  . Robitussin Dm [Dextromethorphan-Guaifenesin] Nausea And Vomiting  . Chocolate Hives  . Nsaids Hives  . Rayon, Purified Hives  . Tramadol Hives   Follow-up Information    Follow up with your primary care provider In 1 week.       The results of significant diagnostics from this hospitalization (including imaging, microbiology, ancillary and laboratory) are listed below for reference.    Significant Diagnostic Studies: Dg Chest Port 1  View  06/04/2015  CLINICAL DATA:  Respiratory failure . EXAM: PORTABLE CHEST 1 VIEW COMPARISON:  06/03/2015 . FINDINGS: Mediastinum hilar structures are normal. Cardiomegaly normal pulmonary vascularity. Low lung volumes with mild bibasilar atelectasis. Tiny left pleural effusion cannot be excluded. No pneumothorax. IMPRESSION: 1. Stable cardiomegaly. 2. Persistent low lung volumes with bibasilar atelectasis. Tiny left pleural effusion cannot be excluded . Electronically Signed   By: Maisie Fus  Register   On: 06/04/2015 07:19   Dg Chest Portable 1 View  06/03/2015  CLINICAL DATA:  Respiratory distress, former smoker, asthma, COPD, hypertension, diabetes mellitus EXAM: PORTABLE CHEST 1 VIEW COMPARISON:  Portable exam 1836 hours compared to 04/27/2015 FINDINGS: Upper normal size of cardiac silhouette. Mediastinal contours and pulmonary vascularity normal. Mild basilar hypoinflation. No pulmonary infiltrate, pleural effusion or pneumothorax. Bones  unremarkable. IMPRESSION: Basilar hypoinflation without acute infiltrate. Electronically Signed   By: Ulyses SouthwardMark  Boles M.D.   On: 06/03/2015 18:42    Microbiology: Recent Results (from the past 240 hour(s))  MRSA PCR Screening     Status: None   Collection Time: 06/03/15  9:39 PM  Result Value Ref Range Status   MRSA by PCR NEGATIVE NEGATIVE Final    Comment:        The GeneXpert MRSA Assay (FDA approved for NASAL specimens only), is one component of a comprehensive MRSA colonization surveillance program. It is not intended to diagnose MRSA infection nor to guide or monitor treatment for MRSA infections.      Labs: Basic Metabolic Panel:  Recent Labs Lab 06/03/15 1841 06/04/15 0245 06/05/15 0312 06/06/15 0428  NA 139 137 132*  --   K 4.1 4.7 4.2  --   CL 104 107 101  --   CO2 24 20* 23  --   GLUCOSE 85 154* 235*  --   BUN 9 9 9   --   CREATININE 0.71 0.90 0.81  --   CALCIUM 10.2 9.5 9.0  --   MG  --  2.1 2.0 1.9  PHOS  --  3.3 2.5 2.0*    Liver Function Tests:  Recent Labs Lab 06/03/15 1841  AST 14*  ALT 10*  ALKPHOS 51  BILITOT 0.3  PROT 7.1  ALBUMIN 3.7   No results for input(s): LIPASE, AMYLASE in the last 168 hours. No results for input(s): AMMONIA in the last 168 hours. CBC:  Recent Labs Lab 06/03/15 1841 06/04/15 0245 06/05/15 0312 06/06/15 0428  WBC 10.5 15.4* 28.1* 26.6*  NEUTROABS 6.8  --  26.2* 24.7*  HGB 11.7* 10.9* 9.7* 9.9*  HCT 37.2 34.9* 31.2* 31.8*  MCV 78.8 79.5 80.0 80.5  PLT 418* 398 372 378   Cardiac Enzymes:  Recent Labs Lab 06/05/15 0312  TROPONINI <0.03   BNP: BNP (last 3 results) No results for input(s): BNP in the last 8760 hours.  ProBNP (last 3 results)  Recent Labs  07/25/14 1718 07/25/14 1844  PROBNP <5.0 <5.0    CBG:  Recent Labs Lab 06/04/15 0826  GLUCAP 117*       Signed:  Jocilyn Trego L  [facility] 06/06/2015, 9:59 AM

## 2015-06-08 ENCOUNTER — Telehealth: Payer: Self-pay

## 2015-06-08 NOTE — Telephone Encounter (Signed)
[facility] Post-discharge Follow-Up Phone Call:  Date of Discharge: 06/06/2015 Principal Discharge Diagnosis(es): vocal cord dysfunction, respiratory distress Post-discharge Communication: call placed to the patient Call Completed: Yes                    With Whom: Patient Interpreter Needed: No     Please check all that apply:  X Patient is knowledgeable of his/her condition(s) and/or treatment. ? Patient is caring for self at home.  ? Patient is receiving assist at home from family and/or caregiver. Family and/or caregiver is knowledgeable of patient's condition(s) and/or treatment. X Patient is receiving home health services. If so, name of agency.  The patient stated that she is receiving PCS services  3 hrs - 7 days/week from 1500-1800.  She noted that the aide assists her with personal care and homemaking and meal preparation in addition to transporting her to her appointments.  She is currently waiting for a re-evaluation of her PCS hours and she noted that [facility] will be coming to see her on 06/16/2015 for the re-assessment.  She said that she has no assistance from family or friends.  She is also scheduled to receive home care services through [facility]. She noted that the nurse said that she would call her tonight to schedule a visit for tomorrow - 06/09/2015     Medication Reconciliation:  ? Medication list reviewed with patient. X Patient obtained all discharge medications. If not, why?  She said that she has all of her medications except for the lyrica.  She noted that she did not have the money for copay when she went to pick it up at the pharmacy.  She said that she is taking all of her medications as ordered.  This CM offered to review all of her medications with her and she said that she did not want to review them at this time; but agreed to bring all of the medications to her appointment on 06/12/15 for the doctor to review.   She said that she did not have any questions about her medications at this time.  She did however mention that she needed to reschedule her appointment at the pain clinic because she was in the hospital and they can't see her until 06/22/15. She noted that she will run out of her pain medication before then and she understands that [facility] can't order the pain medications that she is requesting. Encouraged her to call the pain clinic back and to inform them that she was in the hospital and would appreciate an earlier appointment as her medication will run out before the scheduled appointment  - 06/22/2015.  She stated that she would call the pain clinic back.    Activities of Daily Living:  ? Independent X Needs assist (describe; ? home DME used)  Her aide assists with personal care and homemaking as needed.  ? Total Care (describe, ? home DME used)   Community resources in place for patient:  ? None   X Home Health/Home DME - [facility] for RN and HH Aide and PCS services - 3 hours x 7 days. She also noted that she received food stamps. ? Assisted Living ? Support Group          Patient Education:  She said that she has her nebulizer and face masks and understands how to use the nebulizer.  She noted that she did not hear  from the oxygen company  ([facility] ) yet.  This CM informed her that they may have been trying to contact her but she was in the hospital . She was agreeable to having this CM call [employer] to inform them that she was in the hospital and and is home now and still needs her oxygen serviced.         Questions/Concerns discussed:  She reported no other problems and had no other questions. She said that her aide will be able to transport her to her appointment at [facility] on 06/12/2015 @ 0900.   This CM then called [employer] # [phone]  regarding the patient's oxygen and spoke to [place]. She stated that they have been trying to reach her and were not aware  that the patient was in the hospital. She noted that they need a recent O2 sat in order to re-certify her oxygen.  CM to call [facility] back with the O2 sat.result.  This CM then provided an update to Dr Venetia Night about the patient's requests for pain medication and the need for an O2 sat.  The most recent O2 sat from an office visit was done on 05/29/2015 and it was 100% on room air.

## 2015-06-09 ENCOUNTER — Telehealth: Payer: Self-pay

## 2015-06-09 DIAGNOSIS — J383 Other diseases of vocal cords: Secondary | ICD-10-CM | POA: Diagnosis not present

## 2015-06-09 DIAGNOSIS — J45909 Unspecified asthma, uncomplicated: Secondary | ICD-10-CM | POA: Diagnosis not present

## 2015-06-09 DIAGNOSIS — F419 Anxiety disorder, unspecified: Secondary | ICD-10-CM | POA: Diagnosis not present

## 2015-06-09 DIAGNOSIS — J9611 Chronic respiratory failure with hypoxia: Secondary | ICD-10-CM | POA: Diagnosis not present

## 2015-06-09 DIAGNOSIS — M549 Dorsalgia, unspecified: Secondary | ICD-10-CM | POA: Diagnosis not present

## 2015-06-09 DIAGNOSIS — E119 Type 2 diabetes mellitus without complications: Secondary | ICD-10-CM | POA: Diagnosis not present

## 2015-06-09 DIAGNOSIS — J449 Chronic obstructive pulmonary disease, unspecified: Secondary | ICD-10-CM | POA: Diagnosis not present

## 2015-06-09 DIAGNOSIS — I10 Essential (primary) hypertension: Secondary | ICD-10-CM | POA: Diagnosis not present

## 2015-06-09 DIAGNOSIS — G8929 Other chronic pain: Secondary | ICD-10-CM | POA: Diagnosis not present

## 2015-06-09 NOTE — Telephone Encounter (Signed)
Call placed to [facility]) # [phone]  regarding the patient's oxygen.  This CM spoke to [facility]  and informed her that the patient's last O2 sat was 100% on room air at her [facility] on 05/29/15. She said that the patient would not qualify for oxygen but inquired if the doctor would like to have a pulse ox overnight on room air done to see if the patient would qualify for oxygen at night.    This CM then spoke to Dr Venetia NightAmao who approved the overnight plse ox on room air   Call then placed again to [facility] and spoke to UllinLynn and notified her that Dr Venetia NightAmao would like the overnight pulse ox done. Larita FifeLynn stated that she needed an order requesting an overnight pulse ox on room air.  The patient's diagnosis and the doctor's NPI # must be on the order and it can be faxed to # [phone].  Message sent to Dr Venetia NightAmao requesting the order.

## 2015-06-10 ENCOUNTER — Other Ambulatory Visit: Payer: Self-pay | Admitting: Family Medicine

## 2015-06-10 ENCOUNTER — Telehealth: Payer: Self-pay

## 2015-06-10 DIAGNOSIS — J383 Other diseases of vocal cords: Secondary | ICD-10-CM

## 2015-06-10 DIAGNOSIS — J45901 Unspecified asthma with (acute) exacerbation: Secondary | ICD-10-CM

## 2015-06-10 DIAGNOSIS — R05 Cough: Secondary | ICD-10-CM

## 2015-06-10 DIAGNOSIS — J449 Chronic obstructive pulmonary disease, unspecified: Secondary | ICD-10-CM

## 2015-06-10 DIAGNOSIS — R058 Other specified cough: Secondary | ICD-10-CM

## 2015-06-10 DIAGNOSIS — Z9981 Dependence on supplemental oxygen: Secondary | ICD-10-CM

## 2015-06-10 NOTE — Telephone Encounter (Signed)
[facility] return [facility] who indicated the nurse that was trying to [facility] was FroidKatie; however, it looks like issue was rectified. This Case Manager verbalized understanding and requested that Velna HatchetSheila give patient's [facility], Dois DavenportSandra, contact number in case any additional issues arise.

## 2015-06-10 NOTE — Telephone Encounter (Signed)
Order for overnight pulse oximetry written by Dr. Venetia NightAmao. Order faxed to [facility] (Fax # [phone]).  Spoke with patient to remind her of her upcoming appointment with Dr. Armen PickupFunches on 06/12/15 at 0900. Patient aware of appointment, and she indicated she indicated she needed transportation to her appointment on 06/12/15. Esmond Plantsecilia Deluna informed of patient's need for transportation to her appointment. Cecilia to arrange transportation. Patient also indicated that her [facility] RN with [facility] trying to reach office; patient was uncertain of issue. Call placed to [facility]; spoke with Velna HatchetSheila who indicated Dois DavenportSandra was [facility] who saw patient. She indicated she was currently "in the field" and she would give her a message to call this Case Manager back.

## 2015-06-11 ENCOUNTER — Telehealth: Payer: Self-pay | Admitting: Family Medicine

## 2015-06-11 NOTE — Telephone Encounter (Signed)
Called patient and left voicemail at [phone]  for [phone] call Mare LoanCecilia back at [phone] in regards to transportation.  If patient calls back please let patient know that transportation has been arranged for her appointment tomorrow. She needs to be ready for pick up at 8:30am.

## 2015-06-12 ENCOUNTER — Encounter: Payer: Self-pay | Admitting: Family Medicine

## 2015-06-12 ENCOUNTER — Ambulatory Visit: Payer: Medicare Other | Attending: Family Medicine | Admitting: Family Medicine

## 2015-06-12 VITALS — BP 118/76 | HR 75 | Temp 99.1°F | Resp 18 | Ht 62.0 in | Wt 194.2 lb

## 2015-06-12 DIAGNOSIS — R05 Cough: Secondary | ICD-10-CM | POA: Insufficient documentation

## 2015-06-12 DIAGNOSIS — F419 Anxiety disorder, unspecified: Secondary | ICD-10-CM | POA: Diagnosis not present

## 2015-06-12 DIAGNOSIS — R058 Other specified cough: Secondary | ICD-10-CM

## 2015-06-12 DIAGNOSIS — G894 Chronic pain syndrome: Secondary | ICD-10-CM

## 2015-06-12 MED ORDER — OXYCODONE HCL 5 MG PO TABS: 5.0000 mg | ORAL_TABLET | Freq: Three times a day (TID) | ORAL | Status: DC | PRN

## 2015-06-12 MED ORDER — MOMETASONE FURO-FORMOTEROL FUM 100-5 MCG/ACT IN AERO: 2.0000 | INHALATION_SPRAY | Freq: Two times a day (BID) | RESPIRATORY_TRACT | Status: DC | PRN

## 2015-06-12 NOTE — Progress Notes (Signed)
Subjective:  Patient ID: Julie Kerr, female    DOB: 04/14/1976  Age: 39 y.o. MRN: [id number]  CC: Asthma dulera, anxiety and pain medicine,   HPI Julie Kerr presents for    1. Cough and SOB: patient was last hospitalized from 10/19-10/22. She reports asthma exacerbated by being out of dulera. She has a diagnosis of vocal cord dysfunction. She reports using her albuterol 5 times yesterday. She reports fever, chills, SOB and CP with coughing. She denies smoking and smoke exposure. She is compliant with all inhalers other than dulera. She is taking cough medicine with codeine which helps.   2. Chronic pain: [facility]. She reports being out of oxycodone and that the prescribed roxicet caused stomach cramping. [facility].   3. An[facility]. Patient reports being out of xanax. She is complaining of trouble sleeping.   Social History  Substance Use Topics  . Smoking status: Former Smoker -- 2.00 packs/day for 17 years    Types: Cigarettes    Quit date: 06/15/2014  . Smokeless tobacco: Never Used  . Alcohol Use: 0.0 oz/week    0 Standard drinks or equivalent per week     Comment: occasional    Outpatient Prescriptions Prior to Visit  Medication Sig Dispense Refill  . albuterol (PROAIR HFA) 108 (90 BASE) MCG/ACT inhaler Inhale 2 puffs into the lungs every 6 (six) hours as needed for wheezing or shortness of breath. 1 Inhaler 0  . albuterol (PROVENTIL) (2.5 MG/3ML) 0.083% nebulizer solution Take 3 mLs by nebulization every 2 (two) hours as needed for wheezing or shortness of breath. 75 mL 1  . ALPRAZolam (XANAX) 1 MG tablet Take 1 tablet (1 mg total) by mouth 3 (three) times daily as needed for anxiety. 20 tablet 0  . arformoterol (BROVANA) 15 MCG/2ML NEBU Take 2 mLs (15 mcg total) by nebulization 2 (two) times daily. 120 mL 1  . azelastine (ASTELIN) 0.1 % nasal  spray Place 2 sprays into both nostrils 2 (two) times daily. Use in each nostril as directed 30 mL 2  . budesonide (PULMICORT) 0.5 MG/2ML nebulizer solution Take 2 mLs (0.5 mg total) by nebulization 2 (two) times daily. 2 mL 1  . docusate sodium (COLACE) 100 MG capsule Take 1 capsule (100 mg total) by mouth 2 (two) times daily. (Patient taking differently: Take 100 mg by mouth daily as needed for mild constipation or moderate constipation. ) 60 capsule 3  . FLUoxetine (PROZAC) 40 MG capsule Take 1 capsule (40 mg total) by mouth at bedtime. 30 capsule 0  . fluticasone (FLONASE) 50 MCG/ACT nasal spray Place 2 sprays into both nostrils 2 (two) times daily. 16 g 3  . ipratropium-albuterol (DUONEB) 0.5-2.5 (3) MG/3ML SOLN Take 3 mLs by nebulization every 4 (four) hours as needed (shortness of breath/wheezing).   2  . mometasone-formoterol (DULERA) 100-5 MCG/ACT AERO Inhale 2 puffs into the lungs 2 (two) times daily as needed for wheezing. 1 Inhaler 0  . morphine (MS CONTIN) 30 MG 12 hr tablet Take 1 tablet (30 mg total) by mouth every 12 (twelve) hours. 20 tablet 0  . oxyCODONE (ROXICODONE) 15 MG immediate release tablet Take 1 tablet (15 mg total) by mouth every 6 (six) hours as needed. (Patient taking differently: Take 15 mg by mouth every 6 (six) hours as needed for pain. ) 20 tablet 0  . oxyCODONE-acetaminophen (  ROXICET) 5-325 MG tablet Take 1 tablet by mouth every 8 (eight) hours as needed for severe pain. 15 tablet 0  . pantoprazole (PROTONIX) 40 MG tablet Take 1 tablet (40 mg total) by mouth daily. 30 tablet 1  . pantoprazole (PROTONIX) 40 MG tablet Take 1 tablet (40 mg total) by mouth daily. 30 tablet 0  . pregabalin (LYRICA) 50 MG capsule Take 1 capsule (50 mg total) by mouth 2 (two) times daily. 60 capsule 1  . promethazine-codeine (PHENERGAN WITH CODEINE) 6.25-10 MG/5ML syrup Take 5 mLs by mouth every 12 (twelve) hours as needed for cough. 118 mL 0  . QUEtiapine (SEROQUEL) 100 MG tablet Take 1  tablet (100 mg total) by mouth at bedtime. 30 tablet 0  . zolpidem (AMBIEN) 10 MG tablet Take 1 tablet (10 mg total) by mouth at bedtime as needed for sleep. (Patient taking differently: Take 10 mg by mouth at bedtime. ) 30 tablet 0   No facility-administered medications prior to visit.    ROS Review of Systems  Constitutional: Positive for fever and chills.  Eyes: Negative for visual disturbance.  Respiratory: Positive for cough and shortness of breath.   Cardiovascular: Positive for chest pain.  Gastrointestinal: Negative for abdominal pain and blood in stool.  Musculoskeletal: Positive for back pain. Negative for arthralgias.  Skin: Negative for rash.  Allergic/Immunologic: Negative for immunocompromised state.  Hematological: Negative for adenopathy. Does not bruise/bleed easily.  Psychiatric/Behavioral: Positive for sleep disturbance. Negative for suicidal ideas and dysphoric mood. The patient is nervous/anxious.     Objective:  BP 118/76 mmHg  Pulse 75  Temp(Src) 99.1 F (37.3 C) (Oral)  Resp 18  Ht  (1.575 m)  Wt 194 lb 3.2 oz (88.089 kg)  BMI 35.51 kg/m2  SpO2 100%  LMP 05/16/2015  BP/Weight 06/12/2015 06/06/2015 06/03/2015  Systolic BP 118 118 -  Diastolic BP 76 59 -  Wt. (Lbs) 194.2 192.24 -  BMI 35.51 - 35.15     Physical Exam  Constitutional: She is oriented to person, place, and time. She appears well-developed and well-nourished. No distress.  HENT:  Head: Normocephalic and atraumatic.  Cardiovascular: Normal rate, regular rhythm, normal heart sounds and intact distal pulses.   Pulmonary/Chest: Effort normal and breath sounds normal.  Patient with upper resp wheezing that resolves when she breathes with her mouth open    Musculoskeletal: She exhibits no edema.  Neurological: She is alert and oriented to person, place, and time.  Skin: Skin is warm and dry. No rash noted.  Psychiatric: She has a normal mood and affect.     Assessment & Plan:    Problem List Items Addressed This Visit    Chronic pain syndrome - Primary (Chronic)   Relevant Medications   oxyCODONE (ROXICODONE) 5 MG immediate release tablet   Upper airway cough syndrome, severe, with clinical VCD (Chronic)   Relevant Medications   mometasone-formoterol (DULERA) 100-5 MCG/ACT AERO      No orders of the defined types were placed in this encounter.    Follow-up: No Follow-up on file.   Julie Phi MD

## 2015-06-12 NOTE — Patient Instructions (Addendum)
Ms. Julie Kerr,  Thank you for coming in today I have refilled oxycodone and dulera  The oxycodone will also help with cough. Try adding honey as well.  Speak with your mental health provider about xanax  [facility] app[facility] for [facility]  F/u here in 4-6 weeks for pap smear   Dr. Armen PickupFunches

## 2015-06-12 NOTE — Progress Notes (Signed)
Patient here for cold symptoms being present for 3 days.  Patient complains of pain scaled at a 10 in her back and ribs, Patient states relief only given through medication.  Patient requesting refill on Xanax and Cough syrup.

## 2015-06-13 DIAGNOSIS — J45909 Unspecified asthma, uncomplicated: Secondary | ICD-10-CM | POA: Diagnosis not present

## 2015-06-22 DIAGNOSIS — G541 Lumbosacral plexus disorders: Secondary | ICD-10-CM | POA: Diagnosis not present

## 2015-06-22 DIAGNOSIS — G603 Idiopathic progressive neuropathy: Secondary | ICD-10-CM | POA: Diagnosis not present

## 2015-06-22 DIAGNOSIS — G8929 Other chronic pain: Secondary | ICD-10-CM | POA: Diagnosis not present

## 2015-06-22 DIAGNOSIS — M545 Low back pain: Secondary | ICD-10-CM | POA: Diagnosis not present

## 2015-06-22 DIAGNOSIS — Z79891 Long term (current) use of opiate analgesic: Secondary | ICD-10-CM | POA: Diagnosis not present

## 2015-06-22 DIAGNOSIS — F4320 Adjustment disorder, unspecified: Secondary | ICD-10-CM | POA: Diagnosis not present

## 2015-06-26 ENCOUNTER — Telehealth: Payer: Self-pay

## 2015-06-26 NOTE — Telephone Encounter (Signed)
Call placed to patient to check on her status. Unable to reach patient; voicemail left requesting return call.

## 2015-07-01 ENCOUNTER — Emergency Department (HOSPITAL_COMMUNITY)
Admission: EM | Admit: 2015-07-01 | Discharge: 2015-07-01 | Disposition: A | Discharge status: Home / Self Care | Payer: Commercial Managed Care - HMO | Attending: Emergency Medicine | Admitting: Emergency Medicine

## 2015-07-01 ENCOUNTER — Telehealth: Payer: Self-pay | Admitting: Family Medicine

## 2015-07-01 ENCOUNTER — Emergency Department (HOSPITAL_COMMUNITY): Payer: Commercial Managed Care - HMO

## 2015-07-01 ENCOUNTER — Encounter (HOSPITAL_COMMUNITY): Payer: Self-pay

## 2015-07-01 DIAGNOSIS — E119 Type 2 diabetes mellitus without complications: Secondary | ICD-10-CM | POA: Insufficient documentation

## 2015-07-01 DIAGNOSIS — R011 Cardiac murmur, unspecified: Secondary | ICD-10-CM | POA: Diagnosis not present

## 2015-07-01 DIAGNOSIS — J441 Chronic obstructive pulmonary disease with (acute) exacerbation: Secondary | ICD-10-CM

## 2015-07-01 DIAGNOSIS — F419 Anxiety disorder, unspecified: Secondary | ICD-10-CM | POA: Insufficient documentation

## 2015-07-01 DIAGNOSIS — Z87891 Personal history of nicotine dependence: Secondary | ICD-10-CM | POA: Diagnosis not present

## 2015-07-01 DIAGNOSIS — I10 Essential (primary) hypertension: Secondary | ICD-10-CM | POA: Diagnosis not present

## 2015-07-01 DIAGNOSIS — Z3202 Encounter for pregnancy test, result negative: Secondary | ICD-10-CM | POA: Diagnosis not present

## 2015-07-01 DIAGNOSIS — R000 Tachycardia, unspecified: Secondary | ICD-10-CM | POA: Insufficient documentation

## 2015-07-01 DIAGNOSIS — Z7951 Long term (current) use of inhaled steroids: Secondary | ICD-10-CM | POA: Diagnosis not present

## 2015-07-01 DIAGNOSIS — Z79899 Other long term (current) drug therapy: Secondary | ICD-10-CM | POA: Insufficient documentation

## 2015-07-01 DIAGNOSIS — R0602 Shortness of breath: Secondary | ICD-10-CM | POA: Diagnosis present

## 2015-07-01 LAB — I-STAT CHEM 8, ED
BUN: 4 mg/dL — ABNORMAL LOW (ref 6–20)
Calcium, Ion: 1.18 mmol/L (ref 1.12–1.23)
Chloride: 101 mmol/L (ref 101–111)
Creatinine, Ser: 1 mg/dL (ref 0.44–1.00)
Glucose, Bld: 112 mg/dL — ABNORMAL HIGH (ref 65–99)
HEMATOCRIT: 36 % (ref 36.0–46.0)
HEMOGLOBIN: 12.2 g/dL (ref 12.0–15.0)
Potassium: 3.8 mmol/L (ref 3.5–5.1)
SODIUM: 139 mmol/L (ref 135–145)
TCO2: 26 mmol/L (ref 0–100)

## 2015-07-01 LAB — I-STAT ARTERIAL BLOOD GAS, ED
ACID-BASE EXCESS: 1 mmol/L (ref 0.0–2.0)
BICARBONATE: 25.2 meq/L — AB (ref 20.0–24.0)
O2 SAT: 99 %
PH ART: 7.41 (ref 7.350–7.450)
TCO2: 26 mmol/L (ref 0–100)
pCO2 arterial: 39.7 mmHg (ref 35.0–45.0)
pO2, Arterial: 141 mmHg — ABNORMAL HIGH (ref 80.0–100.0)

## 2015-07-01 LAB — CBC WITH DIFFERENTIAL/PLATELET
BASOS PCT: 1 %
Basophils Absolute: 0 10*3/uL (ref 0.0–0.1)
EOS ABS: 0.3 10*3/uL (ref 0.0–0.7)
EOS PCT: 4 %
HCT: 32.9 % — ABNORMAL LOW (ref 36.0–46.0)
Hemoglobin: 10.4 g/dL — ABNORMAL LOW (ref 12.0–15.0)
Lymphocytes Relative: 28 %
Lymphs Abs: 1.9 10*3/uL (ref 0.7–4.0)
MCH: 25.3 pg — ABNORMAL LOW (ref 26.0–34.0)
MCHC: 31.6 g/dL (ref 30.0–36.0)
MCV: 80 fL (ref 78.0–100.0)
MONO ABS: 0.4 10*3/uL (ref 0.1–1.0)
MONOS PCT: 6 %
Neutro Abs: 4.1 10*3/uL (ref 1.7–7.7)
Neutrophils Relative %: 61 %
Platelets: 451 10*3/uL — ABNORMAL HIGH (ref 150–400)
RBC: 4.11 MIL/uL (ref 3.87–5.11)
RDW: 17.5 % — AB (ref 11.5–15.5)
WBC: 6.7 10*3/uL (ref 4.0–10.5)

## 2015-07-01 LAB — I-STAT BETA HCG BLOOD, ED (MC, WL, AP ONLY)

## 2015-07-01 LAB — I-STAT CG4 LACTIC ACID, ED: Lactic Acid, Venous: 1.65 mmol/L (ref 0.5–2.0)

## 2015-07-01 MED ORDER — MAGNESIUM SULFATE 2 GM/50ML IV SOLN
2.0000 g | Freq: Once | INTRAVENOUS | Status: AC
  Administered 2015-07-01: 2 g via INTRAVENOUS
  Filled 2015-07-01: qty 50

## 2015-07-01 MED ORDER — PREDNISONE 20 MG PO TABS: ORAL_TABLET | ORAL | Status: DC

## 2015-07-01 MED ORDER — IPRATROPIUM BROMIDE 0.02 % IN SOLN
0.5000 mg | Freq: Once | RESPIRATORY_TRACT | Status: AC
  Administered 2015-07-01: 0.5 mg via RESPIRATORY_TRACT
  Filled 2015-07-01: qty 2.5

## 2015-07-01 MED ORDER — ALBUTEROL SULFATE (2.5 MG/3ML) 0.083% IN NEBU
5.0000 mg | INHALATION_SOLUTION | Freq: Once | RESPIRATORY_TRACT | Status: AC
  Administered 2015-07-01: 5 mg via RESPIRATORY_TRACT
  Filled 2015-07-01: qty 6

## 2015-07-01 MED ORDER — MORPHINE SULFATE (PF) 4 MG/ML IV SOLN
4.0000 mg | Freq: Once | INTRAVENOUS | Status: AC
  Administered 2015-07-01: 4 mg via INTRAVENOUS
  Filled 2015-07-01: qty 1

## 2015-07-01 MED ORDER — ONDANSETRON HCL 4 MG/2ML IJ SOLN
4.0000 mg | Freq: Once | INTRAMUSCULAR | Status: AC
  Administered 2015-07-01: 4 mg via INTRAVENOUS
  Filled 2015-07-01: qty 2

## 2015-07-01 MED ORDER — ALBUTEROL (5 MG/ML) CONTINUOUS INHALATION SOLN
10.0000 mg/h | INHALATION_SOLUTION | RESPIRATORY_TRACT | Status: DC
  Administered 2015-07-01: 10 mg/h via RESPIRATORY_TRACT
  Filled 2015-07-01: qty 20

## 2015-07-01 MED ORDER — LORAZEPAM 2 MG/ML IJ SOLN
1.0000 mg | Freq: Once | INTRAMUSCULAR | Status: AC
  Administered 2015-07-01: 1 mg via INTRAVENOUS
  Filled 2015-07-01: qty 1

## 2015-07-01 MED ORDER — METHYLPREDNISOLONE SODIUM SUCC 125 MG IJ SOLR
125.0000 mg | Freq: Once | INTRAMUSCULAR | Status: AC
  Administered 2015-07-01: 125 mg via INTRAVENOUS
  Filled 2015-07-01: qty 2

## 2015-07-01 MED ORDER — ACETAMINOPHEN 325 MG PO TABS
650.0000 mg | ORAL_TABLET | Freq: Once | ORAL | Status: AC
Start: 2015-07-01 — End: 2015-07-01
  Administered 2015-07-01: 650 mg via ORAL
  Filled 2015-07-01: qty 2

## 2015-07-01 NOTE — Care Management (Addendum)
ED CM consulted concerning patient, she reports not having access to her medications. She states, that her roommates is not allowing her entry to her home to obtain her medications. Patient has Medicaid and has picked up monthly meds from Pharmacy on 11/7. Explained that insurance will not pay for refill until 12/7 patient is aware.  Patients stated that she have not been able to gain entry for the past 2 days, so she has not taken meds for the past 2 days. CM explained that once she is discharged she can contact [employer] in the ED and have a an officer drive her to her home to get her medications, patient verbalized understand. Also provided patient with resources for Legal Counsel regarding her rights to her home.  CM discussed discharge plan with Dr. Adela LankFloyd and Trula Orehristina RN on Pod A.  [facility]  No further ED CM needs identified.

## 2015-07-01 NOTE — Hospital Discharge Follow-Up (Signed)
This CM spoke to Oletta Cohnamellia Wood, RN CM and informed her that the patient [facility] and needed to schedule an appointment to [facility].  Will schedule a follow up appointment when discharge is determined .  Case then discussed with Michel BickersWandalyn Rogers, RN CM. Discharge disposition not determined yet. Will continue to follow.

## 2015-07-01 NOTE — Discharge Instructions (Signed)

## 2015-07-01 NOTE — ED Notes (Signed)
Pt placed in gown and in bed. Pt monitored by pulse ox, bp cuff, and 12-lead. 

## 2015-07-01 NOTE — ED Notes (Signed)
X-ray at bedside

## 2015-07-01 NOTE — ED Notes (Signed)
Pt. Arrived with audible expiratory wheezing and wheezing cough

## 2015-07-01 NOTE — Telephone Encounter (Signed)
[facility] let [facility]CP know that she was scheduled to see patient today but patient is not answering door and or phone. Nurse stated that patient was aware she was coming to her home between 10am and 11am. Please f/u if necessary.

## 2015-07-01 NOTE — ED Provider Notes (Signed)
CSN: [id number]     Arrival date & time 07/01/15  1118 History   First MD Initiated Contact with Patient 07/01/15 1129     Chief Complaint  Patient presents with  . Asthma     (Consider location/radiation/quality/duration/timing/severity/associated sxs/prior Treatment) HPI Comments: Patient is a 39 year old female with a history of asthma, vocal cord dysfunction, prior intubation and trach placement who presents today with wheezing and shortness of breath. History is given by patient and family members. Patient ran out of her albuterol inhaler and other medications a few days ago. She went to her apartment to get her medications in the roommate would not let her in. For the last 2 days she's been going without her medication with worsening shortness of breath, wheezing and cough. Symptoms became severe this morning and she came to the emergency room. She is unable to speak due to shortness of breath. Family states they are convinced she would not be here if she would've had her medication to take regularly at home. She denied any URI symptoms. Last menses was 2 weeks ago. No unilateral leg pain or swelling.  Patient is a 39 y.o. female presenting with asthma. The history is provided by the patient and a relative.  Asthma This is a recurrent problem. The current episode started 2 days ago. The problem occurs constantly. The problem has been rapidly worsening. Associated symptoms include chest pain and shortness of breath.    Past Medical History  Diagnosis Date  . Asthma   . Anxiety   . COPD (chronic obstructive pulmonary disease) (HCC)   . Bronchitis   . Heart murmur   . Shortness of breath   . Diabetes mellitus without complication (HCC)   . Hypertension    Past Surgical History  Procedure Laterality Date  . Spinal fusion    . Tracheostomy  December 2015   Family History  Problem Relation Age of Onset  . HIV Mother   . Heart disease Father   . CVA Father   . Heart disease  Other   . Emphysema Maternal Grandmother     smoked  . Asthma Sister   . Clotting disorder Sister   . Clotting disorder Maternal Grandmother   . Lung cancer Maternal Grandmother     smoked   Social History  Substance Use Topics  . Smoking status: Former Smoker -- 2.00 packs/day for 17 years    Types: Cigarettes    Quit date: 06/15/2014  . Smokeless tobacco: Never Used  . Alcohol Use: 0.0 oz/week    0 Standard drinks or equivalent per week     Comment: occasional   OB History    No data available     Review of Systems  Respiratory: Positive for shortness of breath.   Cardiovascular: Positive for chest pain.  All other systems reviewed and are negative.     Allergies  Robitussin dm; Chocolate; Nsaids; Rayon, purified; and Tramadol  Home Medications   Prior to Admission medications   Medication Sig Start Date End Date Taking? Authorizing Provider  albuterol (PROAIR HFA) 108 (90 BASE) MCG/ACT inhaler Inhale 2 puffs into the lungs every 6 (six) hours as needed for wheezing or shortness of breath. 06/06/15   Christiane Ha, MD  albuterol (PROVENTIL) (2.5 MG/3ML) 0.083% nebulizer solution Take 3 mLs by nebulization every 2 (two) hours as needed for wheezing or shortness of breath. 03/28/15   Meredeth Ide, MD  ALPRAZolam Prudy Feeler) 1 MG tablet Take 1 tablet (1 mg  total) by mouth 3 (three) times daily as needed for anxiety. Patient not taking: Reported on 06/12/2015 06/06/15   Christiane Haorinna L Sullivan, MD  arformoterol (BROVANA) 15 MCG/2ML NEBU Take 2 mLs (15 mcg total) by nebulization 2 (two) times daily. 04/30/15   Ripudeep Jenna LuoK Rai, MD  azelastine (ASTELIN) 0.1 % nasal spray Place 2 sprays into both nostrils 2 (two) times daily. Use in each nostril as directed 04/30/15   Ripudeep K Rai, MD  budesonide (PULMICORT) 0.5 MG/2ML nebulizer solution Take 2 mLs (0.5 mg total) by nebulization 2 (two) times daily. 03/28/15   Meredeth IdeGagan S Lama, MD  docusate sodium (COLACE) 100 MG capsule Take 1 capsule  (100 mg total) by mouth 2 (two) times daily. Patient taking differently: Take 100 mg by mouth daily as needed for mild constipation or moderate constipation.  04/30/15   Ripudeep Jenna LuoK Rai, MD  FLUoxetine (PROZAC) 40 MG capsule Take 1 capsule (40 mg total) by mouth at bedtime. 06/06/15   Christiane Haorinna L Sullivan, MD  fluticasone (FLONASE) 50 MCG/ACT nasal spray Place 2 sprays into both nostrils 2 (two) times daily. 04/30/15   Ripudeep Jenna LuoK Rai, MD  ipratropium-albuterol (DUONEB) 0.5-2.5 (3) MG/3ML SOLN Take 3 mLs by nebulization every 4 (four) hours as needed (shortness of breath/wheezing).  10/22/14   Historical Provider, MD  mometasone-formoterol (DULERA) 100-5 MCG/ACT AERO Inhale 2 puffs into the lungs 2 (two) times daily as needed for wheezing. 06/12/15   Josalyn Funches, MD  oxyCODONE (ROXICODONE) 5 MG immediate release tablet Take 1 tablet (5 mg total) by mouth every 8 (eight) hours as needed for severe pain. 06/12/15   Josalyn Funches, MD  pantoprazole (PROTONIX) 40 MG tablet Take 1 tablet (40 mg total) by mouth daily. 06/06/15   Christiane Haorinna L Sullivan, MD  pregabalin (LYRICA) 50 MG capsule Take 1 capsule (50 mg total) by mouth 2 (two) times daily. 05/29/15   Jaclyn ShaggyEnobong Amao, MD  promethazine-codeine (PHENERGAN WITH CODEINE) 6.25-10 MG/5ML syrup Take 5 mLs by mouth every 12 (twelve) hours as needed for cough. 06/06/15   Christiane Haorinna L Sullivan, MD  QUEtiapine (SEROQUEL) 100 MG tablet Take 1 tablet (100 mg total) by mouth at bedtime. 06/06/15   Christiane Haorinna L Sullivan, MD  zolpidem (AMBIEN) 10 MG tablet Take 1 tablet (10 mg total) by mouth at bedtime as needed for sleep. Patient not taking: Reported on 06/12/2015 04/30/15   Ripudeep K Rai, MD   BP 112/56 mmHg  Pulse 132  Temp(Src) 98.4 F (36.9 C) (Oral)  Resp 19  SpO2 99%  LMP 06/17/2015 Physical Exam  Constitutional: She is oriented to person, place, and time. She appears well-developed and well-nourished.  Appears anxious and uncomfortable  HENT:  Head: Normocephalic  and atraumatic.  Mouth/Throat: Oropharynx is clear and moist.  Eyes: Conjunctivae and EOM are normal. Pupils are equal, round, and reactive to light.  Neck: Normal range of motion. Neck supple.  Healing trach scar  Cardiovascular: Regular rhythm and intact distal pulses.  Tachycardia present.   No murmur heard. Pulmonary/Chest: Stridor present. Tachypnea noted. She is in respiratory distress. She has wheezes. She has no rales.  Abdominal: Soft. She exhibits no distension. There is no tenderness. There is no rebound and no guarding.  Musculoskeletal: Normal range of motion. She exhibits no edema or tenderness.  Neurological: She is alert and oriented to person, place, and time.  Skin: Skin is warm and dry. No rash noted. No erythema.  Psychiatric: She has a normal mood and affect. Her behavior is normal.  Nursing note and vitals reviewed.   ED Course  Procedures (including critical care time) Labs Review Labs Reviewed  CBC WITH DIFFERENTIAL/PLATELET - Abnormal; Notable for the following:    Hemoglobin 10.4 (*)    HCT 32.9 (*)    MCH 25.3 (*)    RDW 17.5 (*)    Platelets 451 (*)    All other components within normal limits  I-STAT CHEM 8, ED - Abnormal; Notable for the following:    BUN 4 (*)    Glucose, Bld 112 (*)    All other components within normal limits  I-STAT ARTERIAL BLOOD GAS, ED - Abnormal; Notable for the following:    pO2, Arterial 141.0 (*)    Bicarbonate 25.2 (*)    All other components within normal limits  I-STAT CG4 LACTIC ACID, ED  I-STAT BETA HCG BLOOD, ED ([facility], [facility], [facility] ONLY)    Imaging Review Dg Chest Port 1 View  07/01/2015  CLINICAL DATA:  Cough and shortness of Breath EXAM: PORTABLE CHEST - 1 VIEW COMPARISON:  None. FINDINGS: The heart size and mediastinal contours are within normal limits. Both lungs are clear. The visualized skeletal structures are unremarkable. IMPRESSION: No active disease. Electronically Signed   By: Alcide Clever M.D.   On:  07/01/2015 12:27   I have personally reviewed and evaluated these images and lab results as part of my medical decision-making.   EKG Interpretation None      MDM   Final diagnoses:  None    Patient is a 39 year old female with a history of severe asthma with prior intubation and trach placement 6 months ago who presents today with shortness of breath and wheezing. She also has a history of vocal cord dysfunction. Patient states she went out of her medication and went to get in her roommate would not let her in.  Patient knows she would not be here if she could've gotten her medication. Upon arrival to the emergency room patient's heart rate was 130 with a respiratory rate of 36 with expiratory wheezing and mild stridor.  Patient appears to be in distress and was unable to speak but will shake her head yes and no to answer questions.  Patient was immediately started on albuterol, Atrovent, magnesium, Solu-Medrol and given Ativan. This seemed to improve patient's symptoms significantly. Imaging without acute abnormalities.  CBC, ABG, chem 8, hCG, lactic acid are all without acute findings.  We'll attempt to control patient's symptoms. Reevaluate to see if she requires admission.  2:45 PM Initially wheezing improved and patient was sleeping however when she woke up she had diffuse tenderness and recurrent shortness of breath. Patient has had persistent tachycardia and temperature of 99. Patient given pain medication and albuterol and Atrovent.  3:41 PM Patient symptoms are improved after a second round of albuterol and pain medication. However patient has a very complex social situation and staying currently not being allowed in her home which exacerbates her symptoms when she gets upset. Case manager is at bedside and is trying to help patient with her current situation.  Gwyneth Sprout, MD 07/01/15 [phone]

## 2015-07-03 ENCOUNTER — Telehealth: Payer: Self-pay | Admitting: Internal Medicine

## 2015-07-03 ENCOUNTER — Telehealth: Payer: Self-pay

## 2015-07-03 NOTE — Telephone Encounter (Signed)
This Case Manager placed call to patient to check on status and to discuss scheduling a hospital follow-up appointment with Dr. Venetia NightAmao.  Inquired about patient's status, and patient indicated she was "not really feeling better."  She indicated she was extremely short of breath today. She stated she was as short of breath as she was on 07/01/15.  Inquired if patient using her inhalers, and she indicated she was using her albuterol inhaler and nebulizer more than prescribed due to shortness of breath.  She indicated she was using her albuterol inhaler every two hours and was still short of breath. Stressed to patient the importance of getting to ED as soon as possible. Patient indicated she was going to call EMS and get to the ED.

## 2015-07-03 NOTE — Telephone Encounter (Signed)
Ok with me to just to [employer] sats (she doesn't ever keep appts)

## 2015-07-03 NOTE — Telephone Encounter (Signed)
Julie Kerr from [employer] calling, she said that patient is now on Medicare and requires Sats.  Last OV 01/19/15.  Does patient need to come back in for OV or just for qual sats?   MW - please advise.

## 2015-07-03 NOTE — Telephone Encounter (Signed)
Called home # for pt and was advised pt NA and to call cell Called cell # and spoke with pt. She is scheduled to come in on Monday afternoon at 2:30. Nothing further needed

## 2015-07-06 ENCOUNTER — Ambulatory Visit: Payer: Self-pay

## 2015-07-07 ENCOUNTER — Telehealth: Payer: Self-pay

## 2015-07-07 NOTE — Telephone Encounter (Signed)
Attempted to contact the patient to check on her status and to discuss scheduling a follow up appointment with Dr Armen PickupFunches. Calls placed to # [phone] (H) and to # [phone] (M) and HIPAA compliant voice mail messages were left requesting a call back to # [phone] or [phone].

## 2015-07-13 ENCOUNTER — Telehealth: Payer: Self-pay

## 2015-07-13 NOTE — Telephone Encounter (Signed)
Call placed to the patient to check on her status and to discuss scheduling a [facility]. The patient stated that she is doing " fine" and reported that she has all of her medications and is taking them as ordered. She said that she did not have any questions/concerns at this time. An appointment was scheduled with Dr Armen PickupFunches for 07/28/15 @ 1500.

## 2015-07-21 ENCOUNTER — Telehealth: Payer: Self-pay

## 2015-07-21 NOTE — Telephone Encounter (Signed)
Call placed to the patient to check on her status.  She said that she is " not doing good."  She stated that she has not been able to get off of the couch x 4 days. She noted that she has experienced shortness of breath and chest pain for 4 days and this has increased her anxiety..  She said that she went to the [facility] and they took her off of her oxycodone and put her on "something else."  She could not remember that name of the new medication and she said that she did not feel right when she took it and she is now refusing to take it.  She said that she notified the [facility] clinic of the reaction that she had to the medication and they wanted her to come back but she is refusing to go back.  Instructed her to go to the ED due to the chest pain and shortness of breath. She [facility] and informed her that she is scheduled to see Dr Armen PickupFunches on 12/13.  She requested an earlier appointment. Instructed her to get immediate attention in the ED. [facility] at a later time as she needs to go to the ED now.  She stated that she understands and would go to ED.

## 2015-07-22 ENCOUNTER — Telehealth: Payer: Self-pay

## 2015-07-22 NOTE — Telephone Encounter (Signed)
This Case Manager placed call to patient to check status as patient did not go to ED on 07/21/15 as directed by Robyne PeersJane Brazeau, RN CM. Unable to reach patient at [phone]; voicemail left requesting return call. In addition, call placed to [phone]; unable to reach patient. Additional voicemail left requesting return call.

## 2015-07-23 ENCOUNTER — Telehealth: Payer: Self-pay

## 2015-07-23 NOTE — Telephone Encounter (Signed)
This call was routed to RN while RN was out on FMLA.  Patient seen in clinic since.

## 2015-07-23 NOTE — Telephone Encounter (Signed)
This Case Manager placed additional call to patient to check status.  Unable to reach patient at [phone]; voicemail left requesting return call. In addition, call placed to #[phone]. Unable to reach patient. Awaiting return call from patient.

## 2015-07-27 ENCOUNTER — Emergency Department (HOSPITAL_COMMUNITY): Payer: Commercial Managed Care - HMO

## 2015-07-27 ENCOUNTER — Inpatient Hospital Stay (HOSPITAL_COMMUNITY)
Admission: EM | Admit: 2015-07-27 | Discharge: 2015-07-29 | DRG: 154 | Disposition: A | Discharge status: Home / Self Care | Payer: Commercial Managed Care - HMO | Attending: Internal Medicine | Admitting: Internal Medicine

## 2015-07-27 ENCOUNTER — Encounter (HOSPITAL_COMMUNITY): Payer: Self-pay

## 2015-07-27 DIAGNOSIS — Z825 Family history of asthma and other chronic lower respiratory diseases: Secondary | ICD-10-CM | POA: Diagnosis not present

## 2015-07-27 DIAGNOSIS — Z888 Allergy status to other drugs, medicaments and biological substances status: Secondary | ICD-10-CM | POA: Diagnosis not present

## 2015-07-27 DIAGNOSIS — J4521 Mild intermittent asthma with (acute) exacerbation: Secondary | ICD-10-CM | POA: Diagnosis not present

## 2015-07-27 DIAGNOSIS — J9621 Acute and chronic respiratory failure with hypoxia: Secondary | ICD-10-CM

## 2015-07-27 DIAGNOSIS — J4520 Mild intermittent asthma, uncomplicated: Secondary | ICD-10-CM | POA: Diagnosis not present

## 2015-07-27 DIAGNOSIS — F418 Other specified anxiety disorders: Secondary | ICD-10-CM | POA: Diagnosis not present

## 2015-07-27 DIAGNOSIS — J45909 Unspecified asthma, uncomplicated: Secondary | ICD-10-CM | POA: Diagnosis present

## 2015-07-27 DIAGNOSIS — I10 Essential (primary) hypertension: Secondary | ICD-10-CM | POA: Diagnosis present

## 2015-07-27 DIAGNOSIS — J9600 Acute respiratory failure, unspecified whether with hypoxia or hypercapnia: Secondary | ICD-10-CM | POA: Diagnosis not present

## 2015-07-27 DIAGNOSIS — F1110 Opioid abuse, uncomplicated: Secondary | ICD-10-CM | POA: Diagnosis present

## 2015-07-27 DIAGNOSIS — G894 Chronic pain syndrome: Secondary | ICD-10-CM | POA: Diagnosis present

## 2015-07-27 DIAGNOSIS — D72829 Elevated white blood cell count, unspecified: Secondary | ICD-10-CM | POA: Diagnosis not present

## 2015-07-27 DIAGNOSIS — Z823 Family history of stroke: Secondary | ICD-10-CM | POA: Diagnosis not present

## 2015-07-27 DIAGNOSIS — Z7952 Long term (current) use of systemic steroids: Secondary | ICD-10-CM

## 2015-07-27 DIAGNOSIS — Z79899 Other long term (current) drug therapy: Secondary | ICD-10-CM | POA: Diagnosis not present

## 2015-07-27 DIAGNOSIS — T380X5A Adverse effect of glucocorticoids and synthetic analogues, initial encounter: Secondary | ICD-10-CM | POA: Diagnosis present

## 2015-07-27 DIAGNOSIS — Z886 Allergy status to analgesic agent status: Secondary | ICD-10-CM

## 2015-07-27 DIAGNOSIS — R011 Cardiac murmur, unspecified: Secondary | ICD-10-CM | POA: Diagnosis present

## 2015-07-27 DIAGNOSIS — J449 Chronic obstructive pulmonary disease, unspecified: Secondary | ICD-10-CM | POA: Diagnosis present

## 2015-07-27 DIAGNOSIS — Z8249 Family history of ischemic heart disease and other diseases of the circulatory system: Secondary | ICD-10-CM

## 2015-07-27 DIAGNOSIS — Z91410 Personal history of adult physical and sexual abuse: Secondary | ICD-10-CM | POA: Diagnosis not present

## 2015-07-27 DIAGNOSIS — K219 Gastro-esophageal reflux disease without esophagitis: Secondary | ICD-10-CM | POA: Diagnosis present

## 2015-07-27 DIAGNOSIS — F419 Anxiety disorder, unspecified: Secondary | ICD-10-CM | POA: Diagnosis present

## 2015-07-27 DIAGNOSIS — E1165 Type 2 diabetes mellitus with hyperglycemia: Secondary | ICD-10-CM | POA: Diagnosis present

## 2015-07-27 DIAGNOSIS — J4541 Moderate persistent asthma with (acute) exacerbation: Secondary | ICD-10-CM | POA: Diagnosis not present

## 2015-07-27 DIAGNOSIS — Z981 Arthrodesis status: Secondary | ICD-10-CM

## 2015-07-27 DIAGNOSIS — Z91018 Allergy to other foods: Secondary | ICD-10-CM | POA: Diagnosis not present

## 2015-07-27 DIAGNOSIS — F1410 Cocaine abuse, uncomplicated: Secondary | ICD-10-CM | POA: Diagnosis present

## 2015-07-27 DIAGNOSIS — J9690 Respiratory failure, unspecified, unspecified whether with hypoxia or hypercapnia: Secondary | ICD-10-CM | POA: Diagnosis present

## 2015-07-27 DIAGNOSIS — Z9109 Other allergy status, other than to drugs and biological substances: Secondary | ICD-10-CM | POA: Diagnosis not present

## 2015-07-27 DIAGNOSIS — Z885 Allergy status to narcotic agent status: Secondary | ICD-10-CM | POA: Diagnosis not present

## 2015-07-27 DIAGNOSIS — J383 Other diseases of vocal cords: Secondary | ICD-10-CM | POA: Diagnosis not present

## 2015-07-27 DIAGNOSIS — F329 Major depressive disorder, single episode, unspecified: Secondary | ICD-10-CM | POA: Insufficient documentation

## 2015-07-27 DIAGNOSIS — Z801 Family history of malignant neoplasm of trachea, bronchus and lung: Secondary | ICD-10-CM | POA: Diagnosis not present

## 2015-07-27 DIAGNOSIS — Z720 Tobacco use: Secondary | ICD-10-CM | POA: Insufficient documentation

## 2015-07-27 DIAGNOSIS — F1910 Other psychoactive substance abuse, uncomplicated: Secondary | ICD-10-CM | POA: Insufficient documentation

## 2015-07-27 DIAGNOSIS — R0600 Dyspnea, unspecified: Secondary | ICD-10-CM | POA: Diagnosis not present

## 2015-07-27 DIAGNOSIS — R0602 Shortness of breath: Secondary | ICD-10-CM | POA: Diagnosis not present

## 2015-07-27 DIAGNOSIS — Z830 Family history of human immunodeficiency virus [HIV] disease: Secondary | ICD-10-CM | POA: Diagnosis not present

## 2015-07-27 DIAGNOSIS — R0603 Acute respiratory distress: Secondary | ICD-10-CM | POA: Insufficient documentation

## 2015-07-27 HISTORY — DX: Other diseases of vocal cords: J38.3

## 2015-07-27 HISTORY — DX: Opioid dependence, uncomplicated: F11.20

## 2015-07-27 LAB — CBC WITH DIFFERENTIAL/PLATELET
BASOS ABS: 0.1 10*3/uL (ref 0.0–0.1)
BASOS PCT: 1 %
EOS ABS: 0.1 10*3/uL (ref 0.0–0.7)
Eosinophils Relative: 1 %
HEMATOCRIT: 35.2 % — AB (ref 36.0–46.0)
HEMOGLOBIN: 11.2 g/dL — AB (ref 12.0–15.0)
Lymphocytes Relative: 36 %
Lymphs Abs: 2.9 10*3/uL (ref 0.7–4.0)
MCH: 24.4 pg — ABNORMAL LOW (ref 26.0–34.0)
MCHC: 31.8 g/dL (ref 30.0–36.0)
MCV: 76.7 fL — ABNORMAL LOW (ref 78.0–100.0)
MONO ABS: 0.6 10*3/uL (ref 0.1–1.0)
Monocytes Relative: 8 %
NEUTROS ABS: 4.4 10*3/uL (ref 1.7–7.7)
NEUTROS PCT: 54 %
Platelets: 441 10*3/uL — ABNORMAL HIGH (ref 150–400)
RBC: 4.59 MIL/uL (ref 3.87–5.11)
RDW: 17 % — AB (ref 11.5–15.5)
WBC: 8.1 10*3/uL (ref 4.0–10.5)

## 2015-07-27 LAB — BLOOD GAS, ARTERIAL
Acid-base deficit: 3.7 mmol/L — ABNORMAL HIGH (ref 0.0–2.0)
Bicarbonate: 19.8 mEq/L — ABNORMAL LOW (ref 20.0–24.0)
DRAWN BY: 295031
O2 CONTENT: 8 L/min
O2 Saturation: 99.1 %
PCO2 ART: 32.3 mmHg — AB (ref 35.0–45.0)
PH ART: 7.404 (ref 7.350–7.450)
Patient temperature: 37
TCO2: 18.2 mmol/L (ref 0–100)
pO2, Arterial: 151 mmHg — ABNORMAL HIGH (ref 80.0–100.0)

## 2015-07-27 LAB — RAPID URINE DRUG SCREEN, HOSP PERFORMED
AMPHETAMINES: NOT DETECTED
Barbiturates: NOT DETECTED
Benzodiazepines: NOT DETECTED
Cocaine: POSITIVE — AB
OPIATES: POSITIVE — AB
TETRAHYDROCANNABINOL: NOT DETECTED

## 2015-07-27 LAB — BASIC METABOLIC PANEL
ANION GAP: 9 (ref 5–15)
BUN: 13 mg/dL (ref 6–20)
CALCIUM: 9.2 mg/dL (ref 8.9–10.3)
CO2: 21 mmol/L — ABNORMAL LOW (ref 22–32)
CREATININE: 0.9 mg/dL (ref 0.44–1.00)
Chloride: 110 mmol/L (ref 101–111)
Glucose, Bld: 99 mg/dL (ref 65–99)
Potassium: 3.8 mmol/L (ref 3.5–5.1)
Sodium: 140 mmol/L (ref 135–145)

## 2015-07-27 LAB — I-STAT BETA HCG BLOOD, ED (MC, WL, AP ONLY)

## 2015-07-27 LAB — I-STAT TROPONIN, ED: Troponin i, poc: 0 ng/mL (ref 0.00–0.08)

## 2015-07-27 LAB — I-STAT CG4 LACTIC ACID, ED: LACTIC ACID, VENOUS: 1.74 mmol/L (ref 0.5–2.0)

## 2015-07-27 LAB — MRSA PCR SCREENING: MRSA by PCR: NEGATIVE

## 2015-07-27 MED ORDER — IPRATROPIUM BROMIDE 0.02 % IN SOLN: 0.5000 mg | Freq: Four times a day (QID) | RESPIRATORY_TRACT | Status: DC

## 2015-07-27 MED ORDER — MORPHINE SULFATE (PF) 2 MG/ML IV SOLN
1.0000 mg | INTRAVENOUS | Status: DC | PRN
  Administered 2015-07-27 – 2015-07-28 (×3): 1 mg via INTRAVENOUS
  Filled 2015-07-27 (×3): qty 1

## 2015-07-27 MED ORDER — FENTANYL CITRATE (PF) 100 MCG/2ML IJ SOLN
50.0000 ug | Freq: Once | INTRAMUSCULAR | Status: AC
  Administered 2015-07-27: 50 ug via INTRAVENOUS
  Filled 2015-07-27: qty 2

## 2015-07-27 MED ORDER — METHYLPREDNISOLONE SODIUM SUCC 125 MG IJ SOLR
125.0000 mg | Freq: Once | INTRAMUSCULAR | Status: AC
  Administered 2015-07-27: 125 mg via INTRAVENOUS
  Filled 2015-07-27: qty 2

## 2015-07-27 MED ORDER — ALBUTEROL SULFATE (2.5 MG/3ML) 0.083% IN NEBU: 2.5000 mg | INHALATION_SOLUTION | Freq: Four times a day (QID) | RESPIRATORY_TRACT | Status: DC

## 2015-07-27 MED ORDER — METHYLPREDNISOLONE SODIUM SUCC 125 MG IJ SOLR
60.0000 mg | Freq: Three times a day (TID) | INTRAMUSCULAR | Status: DC
  Administered 2015-07-27: 60 mg via INTRAVENOUS
  Filled 2015-07-27: qty 2

## 2015-07-27 MED ORDER — LORAZEPAM 2 MG/ML IJ SOLN
1.0000 mg | Freq: Three times a day (TID) | INTRAMUSCULAR | Status: DC | PRN
  Administered 2015-07-27 – 2015-07-29 (×4): 1 mg via INTRAVENOUS
  Filled 2015-07-27 (×4): qty 1

## 2015-07-27 MED ORDER — ONDANSETRON HCL 4 MG/2ML IJ SOLN: 4.0000 mg | Freq: Four times a day (QID) | INTRAMUSCULAR | Status: DC | PRN

## 2015-07-27 MED ORDER — METHYLPREDNISOLONE SODIUM SUCC 40 MG IJ SOLR: 40.0000 mg | Freq: Two times a day (BID) | INTRAMUSCULAR | Status: DC

## 2015-07-27 MED ORDER — IPRATROPIUM BROMIDE 0.02 % IN SOLN
0.5000 mg | Freq: Four times a day (QID) | RESPIRATORY_TRACT | Status: DC
  Administered 2015-07-27: 0.5 mg via RESPIRATORY_TRACT
  Filled 2015-07-27: qty 2.5

## 2015-07-27 MED ORDER — ACETAMINOPHEN 650 MG RE SUPP: 650.0000 mg | Freq: Four times a day (QID) | RECTAL | Status: DC | PRN

## 2015-07-27 MED ORDER — ACETAMINOPHEN 325 MG PO TABS
650.0000 mg | ORAL_TABLET | Freq: Four times a day (QID) | ORAL | Status: DC | PRN
  Administered 2015-07-28: 650 mg via ORAL
  Filled 2015-07-27: qty 2

## 2015-07-27 MED ORDER — ENSURE ENLIVE PO LIQD
237.0000 mL | Freq: Three times a day (TID) | ORAL | Status: DC
  Administered 2015-07-28 – 2015-07-29 (×5): 237 mL via ORAL

## 2015-07-27 MED ORDER — IPRATROPIUM BROMIDE 0.02 % IN SOLN
0.5000 mg | Freq: Four times a day (QID) | RESPIRATORY_TRACT | Status: DC
  Administered 2015-07-27 – 2015-07-29 (×6): 0.5 mg via RESPIRATORY_TRACT
  Filled 2015-07-27 (×7): qty 2.5

## 2015-07-27 MED ORDER — ONDANSETRON HCL 4 MG PO TABS: 4.0000 mg | ORAL_TABLET | Freq: Four times a day (QID) | ORAL | Status: DC | PRN

## 2015-07-27 MED ORDER — IPRATROPIUM BROMIDE 0.02 % IN SOLN
0.5000 mg | Freq: Once | RESPIRATORY_TRACT | Status: AC
  Administered 2015-07-27: 0.5 mg via RESPIRATORY_TRACT

## 2015-07-27 MED ORDER — ARFORMOTEROL TARTRATE 15 MCG/2ML IN NEBU: 15.0000 ug | INHALATION_SOLUTION | Freq: Two times a day (BID) | RESPIRATORY_TRACT | Status: DC

## 2015-07-27 MED ORDER — LEVALBUTEROL HCL 0.63 MG/3ML IN NEBU
0.6300 mg | INHALATION_SOLUTION | Freq: Four times a day (QID) | RESPIRATORY_TRACT | Status: DC
  Administered 2015-07-28 – 2015-07-29 (×5): 0.63 mg via RESPIRATORY_TRACT
  Filled 2015-07-27 (×6): qty 3

## 2015-07-27 MED ORDER — LEVALBUTEROL HCL 0.63 MG/3ML IN NEBU: 0.6300 mg | INHALATION_SOLUTION | Freq: Four times a day (QID) | RESPIRATORY_TRACT | Status: DC

## 2015-07-27 MED ORDER — LEVALBUTEROL HCL 0.63 MG/3ML IN NEBU: 0.6300 mg | INHALATION_SOLUTION | RESPIRATORY_TRACT | Status: DC | PRN

## 2015-07-27 MED ORDER — QUETIAPINE FUMARATE 100 MG PO TABS
100.0000 mg | ORAL_TABLET | Freq: Every day | ORAL | Status: DC
  Administered 2015-07-27 – 2015-07-28 (×2): 100 mg via ORAL
  Filled 2015-07-27 (×2): qty 1

## 2015-07-27 MED ORDER — SODIUM CHLORIDE 0.9 % IV BOLUS (SEPSIS)
1000.0000 mL | Freq: Once | INTRAVENOUS | Status: AC
  Administered 2015-07-27: 1000 mL via INTRAVENOUS

## 2015-07-27 MED ORDER — ALBUTEROL (5 MG/ML) CONTINUOUS INHALATION SOLN
INHALATION_SOLUTION | RESPIRATORY_TRACT | Status: AC
  Filled 2015-07-27: qty 20

## 2015-07-27 MED ORDER — PREGABALIN 50 MG PO CAPS
50.0000 mg | ORAL_CAPSULE | Freq: Two times a day (BID) | ORAL | Status: DC
  Administered 2015-07-27 – 2015-07-29 (×4): 50 mg via ORAL
  Filled 2015-07-27 (×4): qty 1

## 2015-07-27 MED ORDER — LEVALBUTEROL HCL 0.63 MG/3ML IN NEBU
0.6300 mg | INHALATION_SOLUTION | Freq: Four times a day (QID) | RESPIRATORY_TRACT | Status: DC
  Administered 2015-07-27: 0.63 mg via RESPIRATORY_TRACT
  Filled 2015-07-27: qty 3

## 2015-07-27 MED ORDER — MOMETASONE FURO-FORMOTEROL FUM 100-5 MCG/ACT IN AERO: 2.0000 | INHALATION_SPRAY | Freq: Two times a day (BID) | RESPIRATORY_TRACT | Status: DC

## 2015-07-27 MED ORDER — BUPRENORPHINE HCL 8 MG SL SUBL: 8.0000 mg | SUBLINGUAL_TABLET | Freq: Every day | SUBLINGUAL | Status: DC

## 2015-07-27 MED ORDER — CETYLPYRIDINIUM CHLORIDE 0.05 % MT LIQD
7.0000 mL | Freq: Two times a day (BID) | OROMUCOSAL | Status: DC
  Administered 2015-07-28 – 2015-07-29 (×3): 7 mL via OROMUCOSAL

## 2015-07-27 MED ORDER — ALBUTEROL (5 MG/ML) CONTINUOUS INHALATION SOLN: 10.0000 mg/h | INHALATION_SOLUTION | Freq: Once | RESPIRATORY_TRACT | Status: DC

## 2015-07-27 MED ORDER — ALBUTEROL (5 MG/ML) CONTINUOUS INHALATION SOLN
10.0000 mg/h | INHALATION_SOLUTION | RESPIRATORY_TRACT | Status: DC
  Administered 2015-07-27: 10 mg/h via RESPIRATORY_TRACT

## 2015-07-27 MED ORDER — ENOXAPARIN SODIUM 40 MG/0.4ML ~~LOC~~ SOLN
40.0000 mg | SUBCUTANEOUS | Status: DC
  Administered 2015-07-27 – 2015-07-28 (×2): 40 mg via SUBCUTANEOUS
  Filled 2015-07-27 (×3): qty 0.4

## 2015-07-27 MED ORDER — PANTOPRAZOLE SODIUM 40 MG PO TBEC
40.0000 mg | DELAYED_RELEASE_TABLET | Freq: Every day | ORAL | Status: DC
  Administered 2015-07-27 – 2015-07-29 (×3): 40 mg via ORAL
  Filled 2015-07-27 (×4): qty 1

## 2015-07-27 MED ORDER — ALBUTEROL (5 MG/ML) CONTINUOUS INHALATION SOLN
10.0000 mg/h | INHALATION_SOLUTION | Freq: Once | RESPIRATORY_TRACT | Status: AC
  Administered 2015-07-27: 09:00:00 via RESPIRATORY_TRACT

## 2015-07-27 MED ORDER — SODIUM CHLORIDE 0.9 % IV SOLN
INTRAVENOUS | Status: DC
  Administered 2015-07-27 – 2015-07-28 (×2): via INTRAVENOUS

## 2015-07-27 MED ORDER — METHYLPREDNISOLONE SODIUM SUCC 40 MG IJ SOLR
40.0000 mg | Freq: Two times a day (BID) | INTRAMUSCULAR | Status: DC
  Administered 2015-07-28 – 2015-07-29 (×3): 40 mg via INTRAVENOUS
  Filled 2015-07-27 (×4): qty 1

## 2015-07-27 MED ORDER — MAGNESIUM SULFATE 2 GM/50ML IV SOLN
2.0000 g | Freq: Once | INTRAVENOUS | Status: AC
  Administered 2015-07-27: 2 g via INTRAVENOUS
  Filled 2015-07-27: qty 50

## 2015-07-27 MED ORDER — OXYCODONE HCL 5 MG PO TABS
5.0000 mg | ORAL_TABLET | Freq: Three times a day (TID) | ORAL | Status: DC | PRN
Start: 2015-07-27 — End: 2015-07-28
  Administered 2015-07-27: 5 mg via ORAL
  Filled 2015-07-27: qty 1

## 2015-07-27 MED ORDER — IPRATROPIUM-ALBUTEROL 0.5-2.5 (3) MG/3ML IN SOLN
RESPIRATORY_TRACT | Status: AC
  Administered 2015-07-27: 10:00:00
  Filled 2015-07-27: qty 3

## 2015-07-27 MED ORDER — FLUOXETINE HCL 20 MG PO CAPS
40.0000 mg | ORAL_CAPSULE | Freq: Every day | ORAL | Status: DC
  Administered 2015-07-27 – 2015-07-28 (×2): 40 mg via ORAL
  Filled 2015-07-27 (×2): qty 2

## 2015-07-27 MED ORDER — LORAZEPAM 2 MG/ML IJ SOLN
0.5000 mg | Freq: Once | INTRAMUSCULAR | Status: AC
  Administered 2015-07-27: 0.5 mg via INTRAVENOUS
  Filled 2015-07-27: qty 1

## 2015-07-27 NOTE — Hospital Discharge Follow-Up (Signed)
The patient is known to the [facility] at [facility]. This CM met with the patient and Joellyn Quails, RN CM.  The patient was very short of breath and was writing her responses to questions. She has a home care nurse from [place] and has PCS services 3 hrs /day x 7 days a week.  She also [facility]  She reported that she ran out of her medications ( anxiety meds, pain meds, inhalers and nebulizer meds) about 4 days prior to presenting in the ED.   [place] has assisted her with transportation to the clinic. She has medicaid and is possibly eligible for medicaid transportation to her medical appointments but she needs to call DSS for an over the phone transportation assessment. She reported that she has been to [facility] and was prescribed a medication that caused an adverse reaction.  She would now like to have a referral to a pain clinic in [facility] or at [place].   Will continue to follow her hospital progress.

## 2015-07-27 NOTE — Progress Notes (Signed)
Call from [facility] CM received at 1136 r/t seeing pt while in [facility] ED  

## 2015-07-27 NOTE — ED Notes (Addendum)
Pt c/o SOB and central chest pain starting last.  Pt reports taking several albuterol treatments w/o relief.  Hx of asthma, COPD, and previous trach.
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
## 2015-07-27 NOTE — Progress Notes (Addendum)
Pt informed ED CM and [facility] she is out of her anxiety, pain and respiratory meds Reports issues with cost States she tried to get [facility] but denied  [facility] Pt states [facility]  Pt has used [facility] nebulizer & RN Pt not using  Has been to [facility] for  but refuse to go back because of "experimental" medicine that made her "want to kill my family and myself" Wants to be back on her old med and interested in [facility] or  Lives with Ms Ronn Melenaillman and has children  Oxygen for APS  [facility] 3 hr qd helps with meds, to appts., etc  Has walker bedside commode, shower chair, oxygen and nebulizer  Pt unable to catch her breath during assessment Pt able to write her responses [facility] recalls pt living situation is not very good  Pt started to cry when admission RN arrived and she asked who Admission RN was

## 2015-07-27 NOTE — ED Notes (Signed)
Pt's wife notified of condition per request of patient.

## 2015-07-27 NOTE — H&P (Signed)
[facility] History and Physical  Darci Lykins OZH:[id number] DOB: 24-Mar-1976 DOA: 07/27/2015  Referring physician: Dr Madilyn Hook PCP: Jaclyn Shaggy, MD   Chief Complaint: SOB.   HPI: Julie Kerr is a 39 y.o. female with PMH significant for VCD, Asthma, has required intubation and trach in the past, patient's present with worsening shortness of breath. Patient unable to provide history, due to shortness of breath. She is writing  some of her answers. She ran out of her medications, anxiety and pain medications. She has been using nebulizer treatments without significant relief. Her shortness of breath that started the day prior to admission.  Evaluation in the ED; Chest x ray negative, Lactic acid 1.4, heart rate at 131, ABG; pH 7.4, CO2 32, oxygen 151  Review of Systems:  Negative, except as per HPI   Past Medical History  Diagnosis Date  . Asthma   . Anxiety   . COPD (chronic obstructive pulmonary disease) (HCC)   . Bronchitis   . Heart murmur   . Shortness of breath   . Diabetes mellitus without complication (HCC)   . Hypertension    Past Surgical History  Procedure Laterality Date  . Spinal fusion    . Tracheostomy  December 2015   Social History:  reports that she quit smoking about 13 months ago. Her smoking use included Cigarettes. She has a 34 pack-year smoking history. She has never used smokeless tobacco. She reports that she drinks alcohol. She reports that she does not use illicit drugs.  Allergies  Allergen Reactions  . Robitussin Dm [Dextromethorphan-Guaifenesin] Nausea And Vomiting  . Chocolate Hives  . Nsaids Hives  . Rayon, Purified Hives  . Tramadol Hives    Family History  Problem Relation Age of Onset  . HIV Mother   . Heart disease Father   . CVA Father   . Heart disease Other   . Emphysema Maternal Grandmother     smoked  . Asthma Sister   . Clotting disorder Sister   . Clotting disorder Maternal Grandmother   . Lung cancer Maternal  Grandmother     smoked    Prior to Admission medications   Medication Sig Start Date End Date Taking? Authorizing Provider  buprenorphine-naloxone (SUBOXONE) 8-2 MG SUBL SL tablet Place 1 tablet under the tongue every 12 (twelve) hours.   Yes Historical Provider, MD  albuterol (PROAIR HFA) 108 (90 BASE) MCG/ACT inhaler Inhale 2 puffs into the lungs every 6 (six) hours as needed for wheezing or shortness of breath. 06/06/15   Christiane Ha, MD  albuterol (PROVENTIL) (2.5 MG/3ML) 0.083% nebulizer solution Take 3 mLs by nebulization every 2 (two) hours as needed for wheezing or shortness of breath. 03/28/15   Meredeth Ide, MD  ALPRAZolam Prudy Feeler) 1 MG tablet Take 1 tablet (1 mg total) by mouth 3 (three) times daily as needed for anxiety. Patient not taking: Reported on 06/12/2015 06/06/15   Christiane Ha, MD  arformoterol (BROVANA) 15 MCG/2ML NEBU Take 2 mLs (15 mcg total) by nebulization 2 (two) times daily. 04/30/15   Ripudeep Jenna Luo, MD  azelastine (ASTELIN) 0.1 % nasal spray Place 2 sprays into both nostrils 2 (two) times daily. Use in each nostril as directed 04/30/15   Ripudeep K Rai, MD  budesonide (PULMICORT) 0.5 MG/2ML nebulizer solution Take 2 mLs (0.5 mg total) by nebulization 2 (two) times daily. 03/28/15   Meredeth Ide, MD  docusate sodium (COLACE) 100 MG capsule Take 1 capsule (100 mg total) by  mouth 2 (two) times daily. Patient taking differently: Take 100 mg by mouth daily as needed for mild constipation or moderate constipation.  04/30/15   Ripudeep Jenna Luo, MD  FLUoxetine (PROZAC) 40 MG capsule Take 1 capsule (40 mg total) by mouth at bedtime. 06/06/15   Christiane Ha, MD  fluticasone (FLONASE) 50 MCG/ACT nasal spray Place 2 sprays into both nostrils 2 (two) times daily. 04/30/15   Ripudeep Jenna Luo, MD  ipratropium-albuterol (DUONEB) 0.5-2.5 (3) MG/3ML SOLN Take 3 mLs by nebulization every 4 (four) hours as needed (shortness of breath/wheezing).  10/22/14   Historical Provider, MD    mometasone-formoterol (DULERA) 100-5 MCG/ACT AERO Inhale 2 puffs into the lungs 2 (two) times daily as needed for wheezing. 06/12/15   Josalyn Funches, MD  oxyCODONE (ROXICODONE) 5 MG immediate release tablet Take 1 tablet (5 mg total) by mouth every 8 (eight) hours as needed for severe pain. 06/12/15   Josalyn Funches, MD  pantoprazole (PROTONIX) 40 MG tablet Take 1 tablet (40 mg total) by mouth daily. 06/06/15   Christiane Ha, MD  predniSONE (DELTASONE) 20 MG tablet 2 tabs po daily x 4 days 07/01/15   Melene Plan, DO  pregabalin (LYRICA) 50 MG capsule Take 1 capsule (50 mg total) by mouth 2 (two) times daily. 05/29/15   Jaclyn Shaggy, MD  promethazine-codeine (PHENERGAN WITH CODEINE) 6.25-10 MG/5ML syrup Take 5 mLs by mouth every 12 (twelve) hours as needed for cough. 06/06/15   Christiane Ha, MD  QUEtiapine (SEROQUEL) 100 MG tablet Take 1 tablet (100 mg total) by mouth at bedtime. 06/06/15   Christiane Ha, MD  zolpidem (AMBIEN) 10 MG tablet Take 1 tablet (10 mg total) by mouth at bedtime as needed for sleep. Patient not taking: Reported on 06/12/2015 04/30/15   Ripudeep Jenna Luo, MD   Physical Exam: Filed Vitals:   07/27/15 1145 07/27/15 1200 07/27/15 1302 07/27/15 1334  BP: 129/72 110/72 148/68   Pulse: 101 101 114   Temp:      TempSrc:      Resp: SpO2: 100% 99% 100% 98%    Wt Readings from Last 3 Encounters:  06/12/15 88.089 kg (194 lb 3.2 oz)  06/06/15 87.2 kg (192 lb 3.9 oz)  05/29/15 87.998 kg (194 lb)    General:  Appears anxious, coughing a lot, mild distress.  Eyes: PERRL, normal lids, irises & conjunctiva ENT: grossly normal hearing, lips & tongue Neck: no LAD, masses or thyromegaly Cardiovascular: RRR, no m/r/g. No LE edema. Telemetry: SR, no arrhythmias  Respiratory:  Normal respiratory effort. Bilaterally rhonchus  and expiratory wheezing. Abdomen: soft, ntnd Skin: no rash or induration seen on limited exam Musculoskeletal: grossly normal tone  BUE/BLE Neurologic: grossly non-focal.          Labs on Admission:  Basic Metabolic Panel:  Recent Labs Lab 07/27/15 0915  NA 140  K 3.8  CL 110  CO2 21*  GLUCOSE 99  BUN 13  CREATININE 0.90  CALCIUM 9.2   Liver Function Tests: No results for input(s): AST, ALT, ALKPHOS, BILITOT, PROT, ALBUMIN in the last 168 hours. No results for input(s): LIPASE, AMYLASE in the last 168 hours. No results for input(s): AMMONIA in the last 168 hours. CBC:  Recent Labs Lab 07/27/15 0915  WBC 8.1  NEUTROABS 4.4  HGB 11.2*  HCT 35.2*  MCV 76.7*  PLT 441*   Cardiac Enzymes: No results for input(s): CKTOTAL, CKMB, CKMBINDEX, TROPONINI in the last  168 hours.  BNP (last 3 results) No results for input(s): BNP in the last 8760 hours.  ProBNP (last 3 results) No results for input(s): PROBNP in the last 8760 hours.  CBG: No results for input(s): GLUCAP in the last 168 hours.  Radiological Exams on Admission: Dg Chest Port 1 View  07/27/2015  CLINICAL DATA:  Shortness of breath, sternal chest pain EXAM: PORTABLE CHEST 1 VIEW COMPARISON:  None. FINDINGS: There are low lung volumes. There is no focal parenchymal opacity. There is no pleural effusion or pneumothorax. The heart and mediastinal contours are unremarkable. The osseous structures are unremarkable. IMPRESSION: No active disease. Electronically Signed   By: Elige KoHetal  Patel   On: 07/27/2015 10:27    EKG: none available.   Assessment/Plan Active Problems:   Asthma  1-Acute Respiratory Failure; multifactorial, secondary to asthma observation, vocal  cord dysfunction. IV Solu-Medrol. Nebulizer treatments. Albuterol, ipratropium.  Home dose Xanax. CCM consulted.   2-Chronic pain; on Suboxone, Oxycodone.  Iv Morphine PRN.    Code Status: Full code.  DVT Prophylaxis:lovenox.  Family Communication: Care discussed with patient Disposition Plan: expect 2 to 3 day inpatient.   Time spent: 75 minutes.   Hartley Barefootegalado, Karalyne Nusser  A [facility] Pager [phone]

## 2015-07-27 NOTE — ED Provider Notes (Signed)
CSN: [id number]     Arrival date & time 07/27/15  0905 History   First MD Initiated Contact with Patient 07/27/15 0912     Chief Complaint  Patient presents with  . Shortness of Breath  . Chest Pain     Patient is a 39 y.o. female presenting with shortness of breath and chest pain. The history is provided by the patient. No language interpreter was used.  Shortness of Breath Associated symptoms: chest pain   Chest Pain Associated symptoms: shortness of breath    Julie Kerr is a 39 y.o. female who presents to the Emergency Department complaining of shortness of breath. She has a history of asthma requiring prior intubation tracheostomy, presents for evaluation of increased shortness of breath and chest tightness since last night. Level 5 caveat due to respiratory distress. She is taken 7 breathing treatments since last night with minimal improvement in her symptoms. No fevers, leg swelling, abdominal pain, vomiting.  Past Medical History  Diagnosis Date  . Asthma   . Anxiety   . COPD (chronic obstructive pulmonary disease) (HCC)   . Bronchitis   . Heart murmur   . Shortness of breath   . Diabetes mellitus without complication (HCC)   . Hypertension    Past Surgical History  Procedure Laterality Date  . Spinal fusion    . Tracheostomy  December 2015   Family History  Problem Relation Age of Onset  . HIV Mother   . Heart disease Father   . CVA Father   . Heart disease Other   . Emphysema Maternal Grandmother     smoked  . Asthma Sister   . Clotting disorder Sister   . Clotting disorder Maternal Grandmother   . Lung cancer Maternal Grandmother     smoked   Social History  Substance Use Topics  . Smoking status: Former Smoker -- 2.00 packs/day for 17 years    Types: Cigarettes    Quit date: 06/15/2014  . Smokeless tobacco: Never Used  . Alcohol Use: 0.0 oz/week    0 Standard drinks or equivalent per week     Comment: occasional   OB History    No data  available     Review of Systems  Respiratory: Positive for shortness of breath.   Cardiovascular: Positive for chest pain.  All other systems reviewed and are negative.     Allergies  Robitussin dm; Chocolate; Nsaids; Rayon, purified; and Tramadol  Home Medications   Prior to Admission medications   Medication Sig Start Date End Date Taking? Authorizing Provider  albuterol (PROAIR HFA) 108 (90 BASE) MCG/ACT inhaler Inhale 2 puffs into the lungs every 6 (six) hours as needed for wheezing or shortness of breath. 06/06/15   Christiane Ha, MD  albuterol (PROVENTIL) (2.5 MG/3ML) 0.083% nebulizer solution Take 3 mLs by nebulization every 2 (two) hours as needed for wheezing or shortness of breath. 03/28/15   Meredeth Ide, MD  ALPRAZolam Prudy Feeler) 1 MG tablet Take 1 tablet (1 mg total) by mouth 3 (three) times daily as needed for anxiety. Patient not taking: Reported on 06/12/2015 06/06/15   Christiane Ha, MD  arformoterol (BROVANA) 15 MCG/2ML NEBU Take 2 mLs (15 mcg total) by nebulization 2 (two) times daily. 04/30/15   Ripudeep Jenna Luo, MD  azelastine (ASTELIN) 0.1 % nasal spray Place 2 sprays into both nostrils 2 (two) times daily. Use in each nostril as directed 04/30/15   Ripudeep Jenna Luo, MD  budesonide (PULMICORT) 0.5  MG/2ML nebulizer solution Take 2 mLs (0.5 mg total) by nebulization 2 (two) times daily. 03/28/15   Meredeth IdeGagan S Lama, MD  docusate sodium (COLACE) 100 MG capsule Take 1 capsule (100 mg total) by mouth 2 (two) times daily. Patient taking differently: Take 100 mg by mouth daily as needed for mild constipation or moderate constipation.  04/30/15   Ripudeep Jenna LuoK Rai, MD  FLUoxetine (PROZAC) 40 MG capsule Take 1 capsule (40 mg total) by mouth at bedtime. 06/06/15   Christiane Haorinna L Sullivan, MD  fluticasone (FLONASE) 50 MCG/ACT nasal spray Place 2 sprays into both nostrils 2 (two) times daily. 04/30/15   Ripudeep Jenna LuoK Rai, MD  ipratropium-albuterol (DUONEB) 0.5-2.5 (3) MG/3ML SOLN Take 3 mLs by  nebulization every 4 (four) hours as needed (shortness of breath/wheezing).  10/22/14   Historical Provider, MD  mometasone-formoterol (DULERA) 100-5 MCG/ACT AERO Inhale 2 puffs into the lungs 2 (two) times daily as needed for wheezing. 06/12/15   Josalyn Funches, MD  oxyCODONE (ROXICODONE) 5 MG immediate release tablet Take 1 tablet (5 mg total) by mouth every 8 (eight) hours as needed for severe pain. 06/12/15   Josalyn Funches, MD  pantoprazole (PROTONIX) 40 MG tablet Take 1 tablet (40 mg total) by mouth daily. 06/06/15   Christiane Haorinna L Sullivan, MD  predniSONE (DELTASONE) 20 MG tablet 2 tabs po daily x 4 days 07/01/15   Melene Planan Floyd, DO  pregabalin (LYRICA) 50 MG capsule Take 1 capsule (50 mg total) by mouth 2 (two) times daily. 05/29/15   Jaclyn ShaggyEnobong Amao, MD  promethazine-codeine (PHENERGAN WITH CODEINE) 6.25-10 MG/5ML syrup Take 5 mLs by mouth every 12 (twelve) hours as needed for cough. 06/06/15   Christiane Haorinna L Sullivan, MD  QUEtiapine (SEROQUEL) 100 MG tablet Take 1 tablet (100 mg total) by mouth at bedtime. 06/06/15   Christiane Haorinna L Sullivan, MD  zolpidem (AMBIEN) 10 MG tablet Take 1 tablet (10 mg total) by mouth at bedtime as needed for sleep. Patient not taking: Reported on 06/12/2015 04/30/15   Ripudeep K Rai, MD   BP 132/97 mmHg  Pulse 110  Temp(Src) 98.6 F (37 C) (Oral)  Resp 28  SpO2 100%  LMP 07/17/2015 Physical Exam  Constitutional: She is oriented to person, place, and time. She appears well-developed and well-nourished. She appears distressed.  HENT:  Head: Normocephalic and atraumatic.  Cardiovascular: Regular rhythm.   No murmur heard. Tachycardic  Pulmonary/Chest: She is in respiratory distress.  Accessory muscle use. Mild stridor. Diffuse inspiratory and expiratory wheezes. Unable to vocalize due to respiratory distress  Abdominal: Soft. There is no tenderness. There is no rebound and no guarding.  Musculoskeletal: She exhibits no edema or tenderness.  Neurological: She is alert and  oriented to person, place, and time.  Skin: Skin is warm and dry.  Psychiatric:  Anxious  Nursing note and vitals reviewed.   ED Course  Procedures (including critical care time) CRITICAL CARE Performed by: Tilden FossaElizabeth Halima Fogal   Total critical care time: 30 minutes  Critical care time was exclusive of separately billable procedures and treating other patients.  Critical care was necessary to treat or prevent imminent or life-threatening deterioration.  Critical care was time spent personally by me on the following activities: development of treatment plan with patient and/or surrogate as well as nursing, discussions with consultants, evaluation of patient's response to treatment, examination of patient, obtaining history from patient or surrogate, ordering and performing treatments and interventions, ordering and review of laboratory studies, ordering and review of radiographic studies, pulse oximetry  and re-evaluation of patient's condition. Labs Review Labs Reviewed  BASIC METABOLIC PANEL - Abnormal; Notable for the following:    CO2 21 (*)    All other components within normal limits  CBC WITH DIFFERENTIAL/PLATELET - Abnormal; Notable for the following:    Hemoglobin 11.2 (*)    HCT 35.2 (*)    MCV 76.7 (*)    MCH 24.4 (*)    RDW 17.0 (*)    Platelets 441 (*)    All other components within normal limits  BLOOD GAS, ARTERIAL - Abnormal; Notable for the following:    pCO2 arterial 32.3 (*)    pO2, Arterial 151 (*)    Bicarbonate 19.8 (*)    Acid-base deficit 3.7 (*)    All other components within normal limits  RESPIRATORY VIRUS PANEL  URINE RAPID DRUG SCREEN, HOSP PERFORMED  I-STAT BETA HCG BLOOD, ED ([facility], [facility], [facility] ONLY)  I-STAT CG4 LACTIC ACID, ED  Rosezena Sensor, ED    Imaging Review Dg Chest Port 1 View  07/27/2015  CLINICAL DATA:  Shortness of breath, sternal chest pain EXAM: PORTABLE CHEST 1 VIEW COMPARISON:  None. FINDINGS: There are low lung volumes. There is no  focal parenchymal opacity. There is no pleural effusion or pneumothorax. The heart and mediastinal contours are unremarkable. The osseous structures are unremarkable. IMPRESSION: No active disease. Electronically Signed   By: Elige Ko   On: 07/27/2015 10:27   I have personally reviewed and evaluated these images and lab results as part of my medical decision-making.   EKG Interpretation None      MDM   Final diagnoses:  None    Patient with history of asthma here with shortness of breath and chest tightness since yesterday. Patient in severe distress on initial examination. She had mild improvement after hour-long nebulizer treatment. She had slight additional improvement with fentanyl and significant improvement with Ativan. Examination is limited secondary to patient's ongoing intermittent stridor that limits lung auscultation. After initial treatment patient did not have any accessory muscle use. She remains alert and cooperative. Discussed with hospitalist regarding admission for further management. Hospitalist for admissions pulmonology consult. Pulmonary was consulted with planned hospitalist admission.      Tilden Fossa, MD 07/27/15 [phone]

## 2015-07-28 ENCOUNTER — Ambulatory Visit: Payer: Self-pay | Admitting: Family Medicine

## 2015-07-28 DIAGNOSIS — R0603 Acute respiratory distress: Secondary | ICD-10-CM | POA: Insufficient documentation

## 2015-07-28 DIAGNOSIS — F419 Anxiety disorder, unspecified: Secondary | ICD-10-CM

## 2015-07-28 DIAGNOSIS — F32A Depression, unspecified: Secondary | ICD-10-CM | POA: Insufficient documentation

## 2015-07-28 DIAGNOSIS — F329 Major depressive disorder, single episode, unspecified: Secondary | ICD-10-CM | POA: Insufficient documentation

## 2015-07-28 DIAGNOSIS — R0600 Dyspnea, unspecified: Secondary | ICD-10-CM

## 2015-07-28 DIAGNOSIS — F1910 Other psychoactive substance abuse, uncomplicated: Secondary | ICD-10-CM | POA: Insufficient documentation

## 2015-07-28 DIAGNOSIS — F418 Other specified anxiety disorders: Secondary | ICD-10-CM

## 2015-07-28 DIAGNOSIS — D72829 Elevated white blood cell count, unspecified: Secondary | ICD-10-CM | POA: Insufficient documentation

## 2015-07-28 LAB — CBC
HCT: 29.2 % — ABNORMAL LOW (ref 36.0–46.0)
Hemoglobin: 9 g/dL — ABNORMAL LOW (ref 12.0–15.0)
MCH: 24.1 pg — ABNORMAL LOW (ref 26.0–34.0)
MCHC: 30.8 g/dL (ref 30.0–36.0)
MCV: 78.3 fL (ref 78.0–100.0)
PLATELETS: 355 10*3/uL (ref 150–400)
RBC: 3.73 MIL/uL — ABNORMAL LOW (ref 3.87–5.11)
RDW: 17.1 % — AB (ref 11.5–15.5)
WBC: 18.5 10*3/uL — ABNORMAL HIGH (ref 4.0–10.5)

## 2015-07-28 LAB — BASIC METABOLIC PANEL
Anion gap: 5 (ref 5–15)
BUN: 11 mg/dL (ref 6–20)
CALCIUM: 9.3 mg/dL (ref 8.9–10.3)
CO2: 20 mmol/L — AB (ref 22–32)
CREATININE: 0.87 mg/dL (ref 0.44–1.00)
Chloride: 114 mmol/L — ABNORMAL HIGH (ref 101–111)
GLUCOSE: 208 mg/dL — AB (ref 65–99)
Potassium: 3.8 mmol/L (ref 3.5–5.1)
Sodium: 139 mmol/L (ref 135–145)

## 2015-07-28 LAB — GLUCOSE, CAPILLARY
GLUCOSE-CAPILLARY: 120 mg/dL — AB (ref 65–99)
Glucose-Capillary: 166 mg/dL — ABNORMAL HIGH (ref 65–99)

## 2015-07-28 MED ORDER — CHLORHEXIDINE GLUCONATE 0.12 % MT SOLN
OROMUCOSAL | Status: AC
Start: 2015-07-28 — End: 2015-07-28
  Administered 2015-07-28: 12:00:00
  Filled 2015-07-28: qty 15

## 2015-07-28 MED ORDER — OXYCODONE HCL 5 MG PO TABS
15.0000 mg | ORAL_TABLET | ORAL | Status: DC | PRN
  Administered 2015-07-28 – 2015-07-29 (×3): 15 mg via ORAL
  Filled 2015-07-28 (×3): qty 3

## 2015-07-28 MED ORDER — VITAMINS A & D EX OINT
TOPICAL_OINTMENT | CUTANEOUS | Status: AC
  Administered 2015-07-28: 5
  Filled 2015-07-28: qty 5

## 2015-07-28 MED ORDER — MORPHINE SULFATE (PF) 2 MG/ML IV SOLN
2.0000 mg | Freq: Once | INTRAVENOUS | Status: AC
  Administered 2015-07-28: 2 mg via INTRAVENOUS
  Filled 2015-07-28: qty 1

## 2015-07-28 MED ORDER — INSULIN ASPART 100 UNIT/ML ~~LOC~~ SOLN
0.0000 [IU] | Freq: Three times a day (TID) | SUBCUTANEOUS | Status: DC
  Administered 2015-07-29 (×2): 1 [IU] via SUBCUTANEOUS

## 2015-07-28 MED ORDER — OXYCODONE HCL 5 MG PO TABS
5.0000 mg | ORAL_TABLET | Freq: Once | ORAL | Status: DC
  Filled 2015-07-28: qty 1

## 2015-07-28 MED ORDER — MORPHINE SULFATE (PF) 2 MG/ML IV SOLN
2.0000 mg | INTRAVENOUS | Status: DC | PRN
  Administered 2015-07-28 – 2015-07-29 (×5): 2 mg via INTRAVENOUS
  Filled 2015-07-28 (×5): qty 1
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
## 2015-07-28 NOTE — Hospital Discharge Follow-Up (Signed)
The patient was asleep when this CM went to see her today.  Marcelle Smilinghonda Davis, RN CM notified that [facility].  Will continue to follow her hospitalization.

## 2015-07-28 NOTE — Progress Notes (Signed)
Patient ID: Julie Kerr, female   DOB: 07/06/1976, 39 y.o.   MRN: [id number] [facility] PROGRESS NOTE  Julie Kerr [id number] DOB: 11/20/1975 DOA: 07/27/2015 PCP: Jaclyn ShaggyEnobong, Amao, MD  Brief narrative:    39 -year-old female with past medical history significant for chronic pain with narcotic dependence, anxiety and depression, vocal cord dysfunction and questionable asthma, history of prior tracheostomy in 2015 who [facility] with worsening shortness of breath. She ran out of nebulizer treatments at home. No reports of fevers at home.  In ED, blood pressure was 102/59, heart rate 131, respiratory rate 17-34. Blood work demonstrated hemoglobin of 11.2, platelets 441. Chest x-ray showed no acute cardiopulmonary process. She was admitted to stepdown unit for first 24 hour period of observation. If she is medically stable for transfer to medical floor today.   Assessment/Plan:    Principal problem: Acute Respiratory Distress without hypoxia / VCD / possible asthma / Leukocytosis - Likely secondary to suspected vocal cord dysfunction versus anxiety, narcotic withdrawal. - ABGs reassuring - Leukocytosis likely secondary to steroids - Continue Xopenex and ipratropium every 6 hours scheduled. Continue Xopenex every 3 hours as needed for shortness of breath or wheezing. - Continue Solu-Medrol 40 mg IV every 12 hours - Continue Protonix daily - Chest x-ray showed no acute cardiopulmonary process - Appreciate pulmonary following  Active problems: Polysubstance abuse - Urine drug screen positive for cocaine and opiates. - Counseled on drug abuse.  Anxiety and depression - Continue Prozac 40 mg daily, Lyrica 50 mg twice daily and Seroquel 100 mg at bedtime - Stable, not depressed.  Steroid-induced hyperglycemia - Last A1c in September 2016 less than 6 - May use sliding scale insulin while patient on steroids  DVT Prophylaxis  - Continue Lovenox  subcutaneous  Code Status: Full.  Family Communication:  plan of care discussed with the patient Disposition Plan: Home 12/14. Transfer to medical floor.  IV access:  Peripheral IV  Procedures and diagnostic studies:    Dg Chest Port 1 View 07/27/2015  No active disease. Electronically Signed   By: Elige KoHetal  Patel   On: 07/27/2015 10:27   Medical Consultants:  PCCM  Other Consultants:  None   IAnti-Infectives:   None    Manson PasseyEVINE, Shary Lamos, MD  [facility] Pager [phone]  Time spent in minutes: 25 minutes  If 7PM-7AM, please contact night-coverage [email or web address] Password [facility] 07/28/2015, 12:34 PM   LOS: 1 day    HPI/Subjective: No acute overnight events. Patient reports she feels better.  Objective: Filed Vitals:   07/28/15 0730 07/28/15 0754 07/28/15 0800 07/28/15 1223  BP:   136/66   Pulse: 81 89 94   Temp:   98.6 F (37 C) 98.9 F (37.2 C)  TempSrc:      Resp: 19 19 21    Height:      Weight:      SpO2: 100% 100% 100%     Intake/Output Summary (Last 24 hours) at 07/28/15 1234 Last data filed at 07/28/15 0800  Gross per 24 hour  Intake   2605 ml  Output   3275 ml  Net   -670 ml    Exam:   General:  Pt is alert, follows commands appropriately, not in acute distress  Cardiovascular: Regular rate and rhythm, S1/S2, no murmurs  Respiratory: Clear to auscultation bilaterally, no wheezing, no crackles, no rhonchi  Abdomen: Soft, non tender, non distended, bowel sounds present  Extremities: No edema, pulses DP and PT palpable bilaterally  Neuro:  Grossly nonfocal  Data Reviewed: Basic Metabolic Panel:  Recent Labs Lab 07/27/15 0915 07/28/15 0347  NA 140 139  K 3.8 3.8  CL 110 114*  CO2 21* 20*  GLUCOSE 99 208*  BUN 13 11  CREATININE 0.90 0.87  CALCIUM 9.2 9.3   Liver Function Tests: No results for input(s): AST, ALT, ALKPHOS, BILITOT, PROT, ALBUMIN in the last 168 hours. No results for input(s): LIPASE, AMYLASE in the last 168  hours. No results for input(s): AMMONIA in the last 168 hours. CBC:  Recent Labs Lab 07/27/15 0915 07/28/15 0347  WBC 8.1 18.5*  NEUTROABS 4.4  --   HGB 11.2* 9.0*  HCT 35.2* 29.2*  MCV 76.7* 78.3  PLT 441* 355   Cardiac Enzymes: No results for input(s): CKTOTAL, CKMB, CKMBINDEX, TROPONINI in the last 168 hours. BNP: Invalid input(s): POCBNP CBG: No results for input(s): GLUCAP in the last 168 hours.  Recent Results (from the past 240 hour(s))  MRSA PCR Screening     Status: None   Collection Time: 07/27/15  4:26 PM  Result Value Ref Range Status   MRSA by PCR NEGATIVE NEGATIVE Final    Comment:        The GeneXpert MRSA Assay (FDA approved for NASAL specimens only), is one component of a comprehensive MRSA colonization surveillance program. It is not intended to diagnose MRSA infection nor to guide or monitor treatment for MRSA infections.      Scheduled Meds: . albuterol  10 mg/hr Nebulization Once  . antiseptic oral rinse  7 mL Mouth Rinse BID  . chlorhexidine      . enoxaparin (LOVENOX) injection  40 mg Subcutaneous Q24H  . feeding supplement (ENSURE ENLIVE)  237 mL Oral TID BM  . FLUoxetine  40 mg Oral QHS  . ipratropium  0.5 mg Nebulization Q6H  . levalbuterol  0.63 mg Nebulization Q6H  . methylPREDNISolone (SOLU-MEDROL) injection  40 mg Intravenous Q12H  . pantoprazole  40 mg Oral Daily  . pregabalin  50 mg Oral BID  . QUEtiapine  100 mg Oral QHS   Continuous Infusions: . sodium chloride 50 mL/hr at 07/27/15 1742

## 2015-07-28 NOTE — Care Management Note (Signed)
Case Management Note  Patient Details  Name: Julie Kerr MRN: [id number] Date of Birth: 02/04/1976  Subjective/Objective:               copd     Action/Plan: Date: July 28, 2015 Chart reviewed for concurrent status and case management needs. Will continue to follow patient for changes and needs: Marcelle Smilinghonda Trenae Brunke, RN, BSN, [place]M   [phone]  Expected Discharge Date:   (unknown)               Expected Discharge Plan:  Home/Self Care  In-House Referral:  NA  Discharge planning Services  CM Consult  Post Acute Care Choice:  NA Choice offered to:  NA  DME Arranged:  N/A DME Agency:  NA  HH Arranged:  NA HH Agency:  NA  Status of Service:  In process, will continue to follow  Medicare Important Message Given:    Date Medicare IM Given:    Medicare IM give by:    Date Additional Medicare IM Given:    Additional Medicare Important Message give by:     If discussed at Long Length of Stay Meetings, dates discussed:    Additional Comments:  Golda AcreDavis, Jermari Tamargo [name], RN 07/28/2015, 9:48 AM

## 2015-07-29 DIAGNOSIS — Z720 Tobacco use: Secondary | ICD-10-CM

## 2015-07-29 DIAGNOSIS — F1410 Cocaine abuse, uncomplicated: Secondary | ICD-10-CM

## 2015-07-29 DIAGNOSIS — J4521 Mild intermittent asthma with (acute) exacerbation: Secondary | ICD-10-CM

## 2015-07-29 LAB — RESPIRATORY VIRUS PANEL
ADENOVIRUS: NEGATIVE
INFLUENZA B 1: NEGATIVE
Influenza A: NEGATIVE
METAPNEUMOVIRUS: NEGATIVE
PARAINFLUENZA 1 A: NEGATIVE
PARAINFLUENZA 3 A: NEGATIVE
Parainfluenza 2: NEGATIVE
RESPIRATORY SYNCYTIAL VIRUS A: NEGATIVE
RHINOVIRUS: NEGATIVE
Respiratory Syncytial Virus B: NEGATIVE

## 2015-07-29 LAB — GLUCOSE, CAPILLARY
GLUCOSE-CAPILLARY: 128 mg/dL — AB (ref 65–99)
Glucose-Capillary: 125 mg/dL — ABNORMAL HIGH (ref 65–99)

## 2015-07-29 MED ORDER — BUDESONIDE 0.5 MG/2ML IN SUSP: 0.5000 mg | Freq: Two times a day (BID) | RESPIRATORY_TRACT | Status: DC

## 2015-07-29 MED ORDER — IPRATROPIUM-ALBUTEROL 0.5-2.5 (3) MG/3ML IN SOLN: 3.0000 mL | RESPIRATORY_TRACT | Status: DC | PRN

## 2015-07-29 MED ORDER — PREDNISONE 20 MG PO TABS
40.0000 mg | ORAL_TABLET | Freq: Two times a day (BID) | ORAL | Status: DC
  Administered 2015-07-29: 40 mg via ORAL
  Filled 2015-07-29 (×2): qty 2

## 2015-07-29 MED ORDER — ARFORMOTEROL TARTRATE 15 MCG/2ML IN NEBU: 15.0000 ug | INHALATION_SOLUTION | Freq: Two times a day (BID) | RESPIRATORY_TRACT | Status: DC

## 2015-07-29 MED ORDER — PREDNISONE 20 MG PO TABS: ORAL_TABLET | ORAL | Status: DC

## 2015-07-29 MED ORDER — ALBUTEROL SULFATE HFA 108 (90 BASE) MCG/ACT IN AERS: 2.0000 | INHALATION_SPRAY | Freq: Four times a day (QID) | RESPIRATORY_TRACT | Status: DC | PRN

## 2015-07-29 MED ORDER — GUAIFENESIN ER 600 MG PO TB12: 600.0000 mg | ORAL_TABLET | Freq: Two times a day (BID) | ORAL | Status: DC

## 2015-07-29 MED ORDER — PREDNISONE 20 MG PO TABS: 40.0000 mg | ORAL_TABLET | Freq: Every day | ORAL | Status: DC

## 2015-07-29 NOTE — Hospital Discharge Follow-Up (Signed)
Met with the patient to discuss scheduling an appointment for follow up after discharge.  She is agreeable to being followed at the [facility] at the [facility].  An appointment was scheduled for 08/06/15 @ 1130 and the information was placed on the AVS. Discussed her plans for obtaining her medications and insuring that she has all of her medications at home as she noted that she had run out of some medications prior to her hospitalization. She said that she was concerned that she would not be given the prescriptions that she needs and she will need to get them from her PCP.Instructed her to make sure that she notifies her provider what medications she was missing before she is discharged.  This CM spoke to her nurse, Julie Kerr, and informed her that the patient is concerned that she will not be given any prescriptions at discharge  Julie Kerr stated that the patient will be given prescriptions but not  prescriptions for pain medications.  This CM explained to Julie Kerr that the [facility] will only prescribe tylenol #3 and the patient will need a referral to pain management and it may take months for her to be seen in a pain management clinic.  She has been referred to [facility] and did not wish to go back there.   Update provided to Julie Herrlich, RN CM.

## 2015-07-29 NOTE — Progress Notes (Signed)
[place] from [place] agree with the assessment. Roosvelt MaserKamia Apollo Timothy, RN

## 2015-07-29 NOTE — Progress Notes (Signed)
Attempted to go over all medications with patient.  Pt stated that she knew how to take them and didn't need me to go over them with her.  Explained importance of taking medications as prescribed and importance of going to follow up appointments.  Prescriptions and AVS summary given.  Pt stated she would call when ready for wheelchair.

## 2015-07-29 NOTE — Discharge Summary (Signed)
Physician Discharge Summary  Stefanie Hodgens ZOX:[id number] DOB: 1976-04-22 DOA: 07/27/2015  PCP: Jaclyn Shaggy, MD  Admit date: 07/27/2015 Discharge date: 07/29/2015  Time spent: >35 minutes  Recommendations for Outpatient Follow-up:  Please provide refills of medication for her chronic pain; and if needed please arrange referral with pain management clinic Repeat CBC and BMET to follow electrolytes, renal function and WBC's trend Please follow CBG closely; patient with elevated CBG's due to steroids use   Discharge Diagnoses:  Asthma Respiratory distress without hypoxia Vocal cord dysfunction Leukocytosis Anxiety and depression Substance abuse (opiates, Tobacco and Cocaine) Chronic pain syndrome GERD  Discharge Condition: stable and improved; no use of accessory muscles, no significant wheezing and good air movement/O2 sat on RA. Will follow up with PCP in 1 week.  Diet recommendation: regular diet (advise to watch carbohydrates)  Filed Weights   07/27/15 1413  Weight: 87.7 kg (193 lb 5.5 oz)    History of present illness:  39 y.o. female with PMH significant for VCD, Asthma, has required intubation and trach in the past, patient's present with worsening shortness of breath. Patient unable to provide history, due to shortness of breath. She is writing some of her answers. She ran out of her medications, anxiety and pain medications. She has been using nebulizer treatments without significant relief. Her shortness of breath that started the day prior to admission.  Evaluation in the ED; Chest x ray negative, Lactic acid 1.4, heart rate at 131, ABG; pH 7.4, CO2 32, oxygen 151  Hospital Course:  Acute Respiratory Distress without hypoxia / VCD / possible asthma / Leukocytosis - Likely secondary to suspected vocal cord dysfunction versus anxiety, narcotic withdrawal. - ABGs reassuring - Leukocytosis likely secondary to steroids - will discharge on steroids tapering -will  resume home inhaler/nebulization therapy - Continue Protonix daily - Chest x-ray showed no acute cardiopulmonary process - pulmonary service looking to follow her as an outpatient - advise to stop recreational drugs and smoking/second hand smoking  Polysubstance abuse - Urine drug screen positive for cocaine and opiates. - Counseled on drug abuse. - Patient expressed she is going to quit and planing to avoid hanging out with friends that can induce/expoxed her to smoke or use drugs   Anxiety and depression - Continue Prozac 40 mg daily, Lyrica 50 mg twice daily and Seroquel 100 mg at bedtime - Overall stable - Continue outpatient follow up with Psych/PCP  Steroid-induced hyperglycemia - Last A1c in September 2016 less than 6 - Anticipating resolution of hyperglycemia once steroids tapered  - will recommend follow up with PCP for close monitoring   Chronic Pain -no pain medications prescribed at discharge -needs follow up with PCP and if needed referral for pain management   Procedures:  See below for x-ray reports   Consultations:  Pulmonary service   Discharge Exam: Filed Vitals:   07/28/15 1834 07/28/15 2119  BP: 116/64 133/68  Pulse: 100 96  Temp: 99 F (37.2 C) 98.7 F (37.1 C)  Resp: 16 18    General: Pt is alert, follows commands appropriately, not in acute distress  Cardiovascular: Regular rate and rhythm, S1/S2, no murmurs  Respiratory: Clear to auscultation bilaterally, no wheezing, no crackles, no rhonchi  Abdomen: Soft, non tender, non distended, bowel sounds present  Extremities: No edema, pulses DP and PT palpable bilaterally  Neuro: Grossly nonfocal   Discharge Instructions   Discharge Instructions    Discharge instructions    Complete by:  As directed   Arrange follow  up with PCP in 1 week Take medications as prescribed Stop the use of illicit drugs No smoking or second hand smoking  Maintain adequate hydration          Current  Discharge Medication List    START taking these medications   Details  guaiFENesin (MUCINEX) 600 MG 12 hr tablet Take 1 tablet (600 mg total) by mouth 2 (two) times daily. Qty: 40 tablet, Refills: 0    predniSONE (DELTASONE) 20 MG tablet Take 3 tablets by mouth daily X 2 days; then 2 tablets by mouth X 2 days; then 1 tablet by mouth X 2 days; then 1/2 tablet by mouth X 2 days and stop prednisone Qty: 15 tablet, Refills: 0      CONTINUE these medications which have CHANGED   Details  albuterol (PROAIR HFA) 108 (90 BASE) MCG/ACT inhaler Inhale 2 puffs into the lungs every 6 (six) hours as needed for wheezing or shortness of breath. Qty: 1 Inhaler, Refills: 2    arformoterol (BROVANA) 15 MCG/2ML NEBU Take 2 mLs (15 mcg total) by nebulization 2 (two) times daily. Qty: 120 mL, Refills: 0    budesonide (PULMICORT) 0.5 MG/2ML nebulizer solution Take 2 mLs (0.5 mg total) by nebulization 2 (two) times daily. Qty: 120 mL, Refills: 1    ipratropium-albuterol (DUONEB) 0.5-2.5 (3) MG/3ML SOLN Take 3 mLs by nebulization every 4 (four) hours as needed (shortness of breath/wheezing). Qty: 360 mL, Refills: 2      CONTINUE these medications which have NOT CHANGED   Details  albuterol (PROVENTIL) (2.5 MG/3ML) 0.083% nebulizer solution Take 3 mLs by nebulization every 2 (two) hours as needed for wheezing or shortness of breath. Qty: 75 mL, Refills: 1    alprazolam (XANAX) 2 MG tablet Take 2 mg by mouth as directed. She takes scheduled twice daily and then may take additional tablet during day if needed for panic attack Refills: 0    azelastine (ASTELIN) 0.1 % nasal spray Place 2 sprays into both nostrils 2 (two) times daily. Use in each nostril as directed Qty: 30 mL, Refills: 2    CALCIUM PO Take 1 tablet by mouth daily.    docusate sodium (COLACE) 100 MG capsule Take 1 capsule (100 mg total) by mouth 2 (two) times daily. Qty: 60 capsule, Refills: 3    ENSURE PLUS (ENSURE PLUS) LIQD Take 237  mLs by mouth 3 (three) times daily between meals.    EVZIO 0.4 MG/0.4ML SOAJ Inject 1 Dose as directed as needed (for overdose).  Refills: 0    FLUoxetine (PROZAC) 40 MG capsule Take 1 capsule (40 mg total) by mouth at bedtime. Qty: 30 capsule, Refills: 0    fluticasone (FLONASE) 50 MCG/ACT nasal spray Place 2 sprays into both nostrils 2 (two) times daily. Qty: 16 g, Refills: 3    morphine (MSIR) 30 MG tablet Take 30 mg by mouth every 12 (twelve) hours.    Multiple Vitamin (MULTIVITAMIN WITH MINERALS) TABS tablet Take 1 tablet by mouth daily.    oxyCODONE (OXY IR/ROXICODONE) 5 MG immediate release tablet Take 15 mg by mouth every 6 (six) hours as needed for severe pain.    pantoprazole (PROTONIX) 40 MG tablet Take 1 tablet (40 mg total) by mouth daily. Qty: 30 tablet, Refills: 0    polyethylene glycol (MIRALAX / GLYCOLAX) packet Take 1 packet by mouth daily as needed for mild constipation.  Refills: 0    pregabalin (LYRICA) 75 MG capsule Take 75 mg by mouth 2 (two)  times daily.    QUEtiapine (SEROQUEL) 100 MG tablet Take 1 tablet (100 mg total) by mouth at bedtime. Qty: 30 tablet, Refills: 0      STOP taking these medications     mometasone-formoterol (DULERA) 100-5 MCG/ACT AERO      promethazine-codeine (PHENERGAN WITH CODEINE) 6.25-10 MG/5ML syrup      zolpidem (AMBIEN) 10 MG tablet        Allergies  Allergen Reactions  . Robitussin Dm [Dextromethorphan-Guaifenesin] Nausea And Vomiting  . Chocolate Hives  . Suboxone [Buprenorphine Hcl-Naloxone Hcl] Other (See Comments)    Aggressive behavior  . Nsaids Hives  . Rayon, Purified Hives  . Tramadol Hives   Follow-up Information    Call Pharmacy that delivers depending on location .   Why:  As needed   Contact information:   [address] [place], [place] [address] Phone: [phone] Fax: [phone] [facility] [address] (previously called [place]),  [place] [address] [phone] fax [phone] M-F 9a-7p Sat 10a-3       Call Medicaid [place] Access Covered Patient .   [phone] [address] [place], [place] [address] [email or web address] USE THIS WEBSITE TO ASSIST WITH UNDERSTANDING YOUR COVERAGE, RENEW APPLICATION   Contact information:   As a Medicaid client you MUST contact DSS/SSI each time you change address, move to another [place] county or another state to keep your address updated      Follow up with Medicaid transportation .      Follow up with Jaclyn Shaggy, MD. Schedule an appointment as soon as possible for a visit in 1 week.   Specialty:  Family Medicine   Contact information:   [address] [place] [address] [phone]       The results of significant diagnostics from this hospitalization (including imaging, microbiology, ancillary and laboratory) are listed below for reference.    Significant Diagnostic Studies: Dg Chest Port 1 View  07/27/2015  CLINICAL DATA:  Shortness of breath, sternal chest pain EXAM: PORTABLE CHEST 1 VIEW COMPARISON:  None. FINDINGS: There are low lung volumes. There is no focal parenchymal opacity. There is no pleural effusion or pneumothorax. The heart and mediastinal contours are unremarkable. The osseous structures are unremarkable. IMPRESSION: No active disease. Electronically Signed   By: Elige Ko   On: 07/27/2015 10:27   Dg Chest Port 1 View  07/01/2015  CLINICAL DATA:  Cough and shortness of Breath EXAM: PORTABLE CHEST - 1 VIEW COMPARISON:  None. FINDINGS: The heart size and mediastinal contours are within normal limits. Both lungs are clear. The visualized skeletal structures are unremarkable. IMPRESSION: No active disease. Electronically Signed   By: Alcide Clever M.D.   On: 07/01/2015 12:27    Microbiology: Recent Results (from the past 240 hour(s))  MRSA PCR Screening     Status: None   Collection Time: 07/27/15  4:26 PM  Result Value Ref Range  Status   MRSA by PCR NEGATIVE NEGATIVE Final    Comment:        The GeneXpert MRSA Assay (FDA approved for NASAL specimens only), is one component of a comprehensive MRSA colonization surveillance program. It is not intended to diagnose MRSA infection nor to guide or monitor treatment for MRSA infections.   Respiratory virus panel     Status: None   Collection Time: 07/27/15  5:18 PM  Result Value Ref Range Status   Respiratory Syncytial  Virus A Negative Negative Final   Respiratory Syncytial Virus B Negative Negative Final   Influenza A Negative Negative Final   Influenza B Negative Negative Final   Parainfluenza 1 Negative Negative Final   Parainfluenza 2 Negative Negative Final   Parainfluenza 3 Negative Negative Final   Metapneumovirus Negative Negative Final   Rhinovirus Negative Negative Final   Adenovirus Negative Negative Final    Comment: (NOTE) Performed At: [facility] [address] [place], [place] [id number] Mila HomerHancock William F MD OZ:[id number]:[phone]      Labs: Basic Metabolic Panel:  Recent Labs Lab 07/27/15 0915 07/28/15 0347  NA 140 139  K 3.8 3.8  CL 110 114*  CO2 21* 20*  GLUCOSE 99 208*  BUN 13 11  CREATININE 0.90 0.87  CALCIUM 9.2 9.3   CBC:  Recent Labs Lab 07/27/15 0915 07/28/15 0347  WBC 8.1 18.5*  NEUTROABS 4.4  --   HGB 11.2* 9.0*  HCT 35.2* 29.2*  MCV 76.7* 78.3  PLT 441* 355   CBG:  Recent Labs Lab 07/28/15 1611 07/28/15 2114 07/29/15 0737 07/29/15 1153  GLUCAP 120* 166* 128* 125*    Signed:  Vassie LollMadera, Laxmi Choung  [facility] 07/29/2015, 1:57 PM

## 2015-07-30 ENCOUNTER — Telehealth: Payer: Self-pay

## 2015-07-30 NOTE — Telephone Encounter (Signed)
Transitional [facility] Clinic Post-discharge Follow-Up Phone Call:  Date of Discharge: 07/29/15 Principal Discharge Diagnosis(es): Acute respiratory distress without hypoxia Post-discharge Communication: Attempt #1 to complete post-discharge follow-up phone call.  Call placed to #[phone]; unable to reach patient. Left voicemail requesting return call.  Also placed call to #[phone]; unable to reach patient at this number as message stated "person you are calling cannot accept calls at this time."  Call completed: No

## 2015-07-31 ENCOUNTER — Telehealth: Payer: Self-pay

## 2015-07-31 NOTE — Telephone Encounter (Signed)
Transitional [facility] Clinic Post-discharge Follow-Up Phone Call:  Date of Discharge: 07/29/15 Principal Discharge Diagnosis(es): Acute respiratory distress without hypoxia Post-discharge Communication: Attempt #3 to complete post-discharge follow-up phone call.  Call placed to #[phone]; unable to reach patient. Additional voicemail left requesting return call. Call completed: No

## 2015-07-31 NOTE — Telephone Encounter (Signed)
Transitional [facility] Clinic Post-discharge Follow-Up Phone Call:  Date of Discharge: 07/29/15 Principal Discharge Diagnosis(es): Acute respiratory distress without hypoxia Post-discharge Communication: Attempt #2 to complete post-discharge follow-up phone call. Call placed to #[phone]; unable to reach patient. Left voicemail requesting return call. In addition, placed call to #[phone]; unable to reach patient at this number as message stated "person you are calling cannot accept calls at this time."  Call completed: No

## 2015-08-03 ENCOUNTER — Telehealth: Payer: Self-pay

## 2015-08-03 NOTE — Telephone Encounter (Signed)
  Transitional Care Clinic Post-discharge Follow-Up Phone Call:  Date of Discharge: 07/29/15  Attempt # 4 to the patient to check on her status.  Call place to # [phone] (H) and the person who answered stated that this is not a contact # for the patient any longer.  Call also placed to #  [phone] (M) and the message stated that this person can't accept calls and to try again later.

## 2015-08-04 ENCOUNTER — Telehealth: Payer: Self-pay

## 2015-08-04 NOTE — Telephone Encounter (Signed)
Attempted to contact the patient to check on her status. Call placed to # [phone] (M) and the message noted that the person is not accepting calls at this time.  No option to leave a message.

## 2015-08-05 ENCOUNTER — Telehealth: Payer: Self-pay

## 2015-08-05 NOTE — Telephone Encounter (Signed)
This Case Manager received voicemail from patient who indicated she was having "real bad chest pains" and a "real bad cough." She indicated she was recently hospitalized and discharged without any medications. Per chart review, patient was discharged on 07/29/15 with the following medications: albuterol inhaler, brovana, Pulmincort, Mucinex, duoneb, and prednisone.   Return call placed x 2 to #[phone] (# patient called from) to inform patient that if she is having chest pain she needs to go to the ED immediately; however, unable to reach patient.  Voicemail left requesting return call. In addition, placed call to #[phone]; left an additional message requesting return call.  Also placed call to #[phone]; unable to reach patient or leave a voicemail as a recording indicated the "person you are calling cannot accept phone calls at this time."

## 2015-08-05 NOTE — Telephone Encounter (Signed)
This Case Manager placed an additional call to patient to check on status as patient called earlier today indicating she was having "real bad chest pains" and a "real bad cough." Once again placed call to patient at #[phone] to discuss going to ED immediately if she is having chest pain.  Unable to reach patient; voicemail left requesting return call.  Also placed call to [phone]; left voicemail requesting return call. Awaiting return call from patient.

## 2015-08-06 ENCOUNTER — Encounter: Payer: Self-pay | Admitting: Family Medicine

## 2015-08-06 ENCOUNTER — Encounter (HOSPITAL_BASED_OUTPATIENT_CLINIC_OR_DEPARTMENT_OTHER): Payer: Medicare Other | Admitting: Clinical

## 2015-08-06 ENCOUNTER — Ambulatory Visit: Payer: Commercial Managed Care - HMO | Attending: Family Medicine | Admitting: Family Medicine

## 2015-08-06 ENCOUNTER — Other Ambulatory Visit: Payer: Self-pay | Admitting: *Deleted

## 2015-08-06 VITALS — BP 121/78 | HR 86 | Temp 99.0°F | Resp 16 | Ht 62.0 in | Wt 195.0 lb

## 2015-08-06 DIAGNOSIS — F1910 Other psychoactive substance abuse, uncomplicated: Secondary | ICD-10-CM | POA: Insufficient documentation

## 2015-08-06 DIAGNOSIS — F411 Generalized anxiety disorder: Secondary | ICD-10-CM | POA: Diagnosis not present

## 2015-08-06 DIAGNOSIS — J9601 Acute respiratory failure with hypoxia: Secondary | ICD-10-CM | POA: Insufficient documentation

## 2015-08-06 DIAGNOSIS — J383 Other diseases of vocal cords: Secondary | ICD-10-CM | POA: Insufficient documentation

## 2015-08-06 DIAGNOSIS — I10 Essential (primary) hypertension: Secondary | ICD-10-CM | POA: Insufficient documentation

## 2015-08-06 DIAGNOSIS — Z659 Problem related to unspecified psychosocial circumstances: Secondary | ICD-10-CM

## 2015-08-06 DIAGNOSIS — F329 Major depressive disorder, single episode, unspecified: Secondary | ICD-10-CM | POA: Diagnosis not present

## 2015-08-06 DIAGNOSIS — Z9181 History of falling: Secondary | ICD-10-CM | POA: Diagnosis not present

## 2015-08-06 DIAGNOSIS — G894 Chronic pain syndrome: Secondary | ICD-10-CM | POA: Diagnosis not present

## 2015-08-06 DIAGNOSIS — F419 Anxiety disorder, unspecified: Secondary | ICD-10-CM | POA: Insufficient documentation

## 2015-08-06 DIAGNOSIS — Z930 Tracheostomy status: Secondary | ICD-10-CM | POA: Diagnosis not present

## 2015-08-06 DIAGNOSIS — G8929 Other chronic pain: Secondary | ICD-10-CM | POA: Diagnosis not present

## 2015-08-06 DIAGNOSIS — Z9981 Dependence on supplemental oxygen: Secondary | ICD-10-CM | POA: Diagnosis not present

## 2015-08-06 DIAGNOSIS — J45909 Unspecified asthma, uncomplicated: Secondary | ICD-10-CM | POA: Insufficient documentation

## 2015-08-06 DIAGNOSIS — R05 Cough: Secondary | ICD-10-CM | POA: Diagnosis not present

## 2015-08-06 DIAGNOSIS — J441 Chronic obstructive pulmonary disease with (acute) exacerbation: Secondary | ICD-10-CM | POA: Diagnosis not present

## 2015-08-06 DIAGNOSIS — R058 Other specified cough: Secondary | ICD-10-CM

## 2015-08-06 MED ORDER — PREGABALIN 75 MG PO CAPS: 75.0000 mg | ORAL_CAPSULE | Freq: Two times a day (BID) | ORAL | Status: DC

## 2015-08-06 MED ORDER — FLUOXETINE HCL 40 MG PO CAPS: 40.0000 mg | ORAL_CAPSULE | Freq: Every day | ORAL | Status: DC

## 2015-08-06 MED ORDER — PROMETHAZINE-CODEINE 6.25-10 MG/5ML PO SYRP: ORAL_SOLUTION | ORAL | Status: DC

## 2015-08-06 MED ORDER — ACETAMINOPHEN-CODEINE #3 300-30 MG PO TABS: 2.0000 | ORAL_TABLET | Freq: Two times a day (BID) | ORAL | Status: DC | PRN
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
## 2015-08-06 NOTE — Progress Notes (Signed)
ASSESSMENT: Pt currently experiencing problem related to psychosocial circumstances, along with symptoms of anxiety. Pt may benefit from psychoeducation and supportive counseling regarding coping with psychosocial circumstances and symptoms of anxiety.  Stage of Change: precontemplative  PLAN: 1. F/U with behavioral health consultant in one month 2. Psychiatric Medications: Prozac, Lyrica 3. Behavioral recommendation(s):   -Consider reading educational material regarding coping with chronic pain -Call Jamie at [facility] in two weeks, at [phone], or as needed, for any information regarding CAP hour increase, if she has not heard back from CAP program  SUBJECTIVE: Pt. referred by Dr Venetia NightAmao for psychosocial :  Pt. reports the following symptoms/concerns: Pt denies symptoms of anxiety or depression, states her primary goal is obtaining an increase in CAP hours, as well as pain relief.  Duration of problem: undetermined Severity: moderate  OBJECTIVE: Orientation & Cognition: Oriented x3. Thought processes normal and appropriate to situation. Mood: irritable. Affect: appropriate Appearance: appropriate Risk of harm to self or others: no known risk of harm to self or others Substance use: tobacco Assessments administered: PHQ9:--/ GAD7:21  Diagnosis: Problem related to psychosocial circumstances/ Anxiety CPT Code: Z65.9/  F41.1 -------------------------------------------- Other(s) present in the room: female friend or relative  Time spent with patient in exam room: 12 minutes

## 2015-08-06 NOTE — Progress Notes (Signed)
Patient states she is here to follow up on her COPD and her Pain mgmt appointment She was discharged from pain mgmt because she states "the medicine they gave me made me want to kill myself and my grandbaby.  My family through the pills away and now pain mgmt won't see me." She is being seen at [name] and states she had an appointment today at noon and decided to come to her appointment here instead She states she is not taking her Lyrica because she could not afford it at the time it was prescribed but now can and would like another Rx.

## 2015-08-07 LAB — DRUG SCREEN URINE W/ALC, PAIN MGMT, REFLEX
AMPHETAMINE SCRN UR: NEGATIVE
Barbiturate Quant, Ur: NEGATIVE
Benzodiazepines.: NEGATIVE
CREATININE, U: 86.03 mg/dL
Ethyl Alcohol: <10 mg/dL (ref <10)
MARIJUANA METABOLITE: NEGATIVE
METHADONE: NEGATIVE
Opiates: NEGATIVE
Phencyclidine (PCP): NEGATIVE
Propoxyphene: NEGATIVE

## 2015-08-11 LAB — COCAINE METABOLITE (GC/LC/MS), URINE: Benzoylecgonine GC/MS Conf: 2383 ng/mL — ABNORMAL HIGH (ref <100)

## 2015-08-13 ENCOUNTER — Telehealth: Payer: Self-pay | Admitting: *Deleted

## 2015-08-13 NOTE — Telephone Encounter (Signed)
Patient called in asking for a prescription for a humidifier for her dry nose.  Explained that a prescription could be written for that and that she could buy one at any drug store or [employer].  Patient verbalized understanding.

## 2015-08-20 ENCOUNTER — Telehealth: Payer: Self-pay

## 2015-08-20 NOTE — Telephone Encounter (Signed)
This Case Manager placed call to patient to check status and to discuss need for Transitional Care follow-up appointment. Call placed to #[phone]; unable to reach patient. Voicemail left requesting return call. In addition, call placed to #[phone]. Unable to reach patient or leave voicemail as recording indicated the "person you are calling cannot accept calls at this time."

## 2015-08-21 ENCOUNTER — Inpatient Hospital Stay (HOSPITAL_COMMUNITY)
Admission: EM | Admit: 2015-08-21 | Discharge: 2015-09-01 | DRG: 208 | Disposition: A | Discharge status: Skilled Nursing Facility | Payer: Commercial Managed Care - HMO | Attending: Internal Medicine | Admitting: Internal Medicine

## 2015-08-21 ENCOUNTER — Encounter (HOSPITAL_COMMUNITY): Payer: Self-pay | Admitting: Family Medicine

## 2015-08-21 ENCOUNTER — Emergency Department (HOSPITAL_COMMUNITY): Payer: Commercial Managed Care - HMO

## 2015-08-21 DIAGNOSIS — R0602 Shortness of breath: Secondary | ICD-10-CM

## 2015-08-21 DIAGNOSIS — Z7289 Other problems related to lifestyle: Secondary | ICD-10-CM

## 2015-08-21 DIAGNOSIS — R062 Wheezing: Secondary | ICD-10-CM

## 2015-08-21 DIAGNOSIS — Z886 Allergy status to analgesic agent status: Secondary | ICD-10-CM

## 2015-08-21 DIAGNOSIS — Z7952 Long term (current) use of systemic steroids: Secondary | ICD-10-CM

## 2015-08-21 DIAGNOSIS — Z91018 Allergy to other foods: Secondary | ICD-10-CM

## 2015-08-21 DIAGNOSIS — M545 Low back pain, unspecified: Secondary | ICD-10-CM

## 2015-08-21 DIAGNOSIS — Z79899 Other long term (current) drug therapy: Secondary | ICD-10-CM

## 2015-08-21 DIAGNOSIS — Z888 Allergy status to other drugs, medicaments and biological substances status: Secondary | ICD-10-CM

## 2015-08-21 DIAGNOSIS — J441 Chronic obstructive pulmonary disease with (acute) exacerbation: Secondary | ICD-10-CM

## 2015-08-21 DIAGNOSIS — N179 Acute kidney failure, unspecified: Secondary | ICD-10-CM | POA: Diagnosis present

## 2015-08-21 DIAGNOSIS — J9601 Acute respiratory failure with hypoxia: Secondary | ICD-10-CM | POA: Diagnosis not present

## 2015-08-21 DIAGNOSIS — J9600 Acute respiratory failure, unspecified whether with hypoxia or hypercapnia: Secondary | ICD-10-CM

## 2015-08-21 DIAGNOSIS — D72829 Elevated white blood cell count, unspecified: Secondary | ICD-10-CM | POA: Diagnosis present

## 2015-08-21 DIAGNOSIS — R061 Stridor: Secondary | ICD-10-CM

## 2015-08-21 DIAGNOSIS — J45909 Unspecified asthma, uncomplicated: Secondary | ICD-10-CM | POA: Diagnosis present

## 2015-08-21 DIAGNOSIS — R52 Pain, unspecified: Secondary | ICD-10-CM

## 2015-08-21 DIAGNOSIS — R0603 Acute respiratory distress: Secondary | ICD-10-CM

## 2015-08-21 DIAGNOSIS — R000 Tachycardia, unspecified: Secondary | ICD-10-CM | POA: Diagnosis present

## 2015-08-21 DIAGNOSIS — J383 Other diseases of vocal cords: Secondary | ICD-10-CM | POA: Diagnosis present

## 2015-08-21 DIAGNOSIS — Z825 Family history of asthma and other chronic lower respiratory diseases: Secondary | ICD-10-CM

## 2015-08-21 DIAGNOSIS — E669 Obesity, unspecified: Secondary | ICD-10-CM | POA: Diagnosis present

## 2015-08-21 DIAGNOSIS — Z87891 Personal history of nicotine dependence: Secondary | ICD-10-CM

## 2015-08-21 DIAGNOSIS — Z4659 Encounter for fitting and adjustment of other gastrointestinal appliance and device: Secondary | ICD-10-CM

## 2015-08-21 DIAGNOSIS — Z7951 Long term (current) use of inhaled steroids: Secondary | ICD-10-CM

## 2015-08-21 DIAGNOSIS — G894 Chronic pain syndrome: Secondary | ICD-10-CM | POA: Diagnosis present

## 2015-08-21 DIAGNOSIS — J449 Chronic obstructive pulmonary disease, unspecified: Secondary | ICD-10-CM | POA: Diagnosis present

## 2015-08-21 DIAGNOSIS — Z981 Arthrodesis status: Secondary | ICD-10-CM

## 2015-08-21 DIAGNOSIS — K5900 Constipation, unspecified: Secondary | ICD-10-CM | POA: Diagnosis present

## 2015-08-21 DIAGNOSIS — F1120 Opioid dependence, uncomplicated: Secondary | ICD-10-CM | POA: Diagnosis present

## 2015-08-21 DIAGNOSIS — G609 Hereditary and idiopathic neuropathy, unspecified: Secondary | ICD-10-CM | POA: Diagnosis present

## 2015-08-21 DIAGNOSIS — I10 Essential (primary) hypertension: Secondary | ICD-10-CM | POA: Diagnosis present

## 2015-08-21 DIAGNOSIS — F1910 Other psychoactive substance abuse, uncomplicated: Secondary | ICD-10-CM | POA: Diagnosis present

## 2015-08-21 DIAGNOSIS — F411 Generalized anxiety disorder: Secondary | ICD-10-CM | POA: Diagnosis present

## 2015-08-21 DIAGNOSIS — W19XXXA Unspecified fall, initial encounter: Secondary | ICD-10-CM

## 2015-08-21 DIAGNOSIS — J9690 Respiratory failure, unspecified, unspecified whether with hypoxia or hypercapnia: Secondary | ICD-10-CM | POA: Diagnosis present

## 2015-08-21 LAB — I-STAT VENOUS BLOOD GAS, ED
ACID-BASE EXCESS: 2 mmol/L (ref 0.0–2.0)
BICARBONATE: 28.2 meq/L — AB (ref 20.0–24.0)
O2 SAT: 79 %
PO2 VEN: 45 mmHg (ref 30.0–45.0)
TCO2: 30 mmol/L (ref 0–100)
pCO2, Ven: 48.1 mmHg (ref 45.0–50.0)
pH, Ven: 7.376 — ABNORMAL HIGH (ref 7.250–7.300)

## 2015-08-21 LAB — CBC WITH DIFFERENTIAL/PLATELET
BASOS ABS: 0 10*3/uL (ref 0.0–0.1)
Basophils Relative: 0 %
EOS PCT: 0 %
Eosinophils Absolute: 0 10*3/uL (ref 0.0–0.7)
HCT: 38.1 % (ref 36.0–46.0)
Hemoglobin: 12.2 g/dL (ref 12.0–15.0)
LYMPHS ABS: 3.5 10*3/uL (ref 0.7–4.0)
LYMPHS PCT: 31 %
MCH: 24.3 pg — AB (ref 26.0–34.0)
MCHC: 32 g/dL (ref 30.0–36.0)
MCV: 75.7 fL — AB (ref 78.0–100.0)
MONO ABS: 0.5 10*3/uL (ref 0.1–1.0)
Monocytes Relative: 5 %
Neutro Abs: 7.2 10*3/uL (ref 1.7–7.7)
Neutrophils Relative %: 64 %
Platelets: 463 10*3/uL — ABNORMAL HIGH (ref 150–400)
RBC: 5.03 MIL/uL (ref 3.87–5.11)
RDW: 17.4 % — AB (ref 11.5–15.5)
WBC: 11.2 10*3/uL — ABNORMAL HIGH (ref 4.0–10.5)

## 2015-08-21 LAB — TRIGLYCERIDES: Triglycerides: 122 mg/dL (ref <150)

## 2015-08-21 LAB — I-STAT ARTERIAL BLOOD GAS, ED
Acid-base deficit: 4 mmol/L — ABNORMAL HIGH (ref 0.0–2.0)
BICARBONATE: 22 meq/L (ref 20.0–24.0)
O2 Saturation: 99 %
PH ART: 7.323 — AB (ref 7.350–7.450)
PO2 ART: 139 mmHg — AB (ref 80.0–100.0)
TCO2: 23 mmol/L (ref 0–100)
pCO2 arterial: 42.6 mmHg (ref 35.0–45.0)

## 2015-08-21 LAB — BASIC METABOLIC PANEL
ANION GAP: 11 (ref 5–15)
BUN: 17 mg/dL (ref 6–20)
CALCIUM: 9.9 mg/dL (ref 8.9–10.3)
CO2: 24 mmol/L (ref 22–32)
Chloride: 108 mmol/L (ref 101–111)
Creatinine, Ser: 1.27 mg/dL — ABNORMAL HIGH (ref 0.44–1.00)
GFR calc Af Amer: >60 mL/min (ref >60)
GFR calc non Af Amer: 53 mL/min — ABNORMAL LOW (ref >60)
GLUCOSE: 105 mg/dL — AB (ref 65–99)
POTASSIUM: 4.3 mmol/L (ref 3.5–5.1)
Sodium: 143 mmol/L (ref 135–145)

## 2015-08-21 MED ORDER — KETAMINE HCL 10 MG/ML IJ SOLN
1.0000 mg/kg | Freq: Once | INTRAMUSCULAR | Status: AC
  Filled 2015-08-21: qty 8.8

## 2015-08-21 MED ORDER — PROPOFOL 1000 MG/100ML IV EMUL
INTRAVENOUS | Status: AC
  Filled 2015-08-21: qty 100

## 2015-08-21 MED ORDER — KETAMINE HCL 10 MG/ML IJ SOLN
INTRAMUSCULAR | Status: AC | PRN
  Administered 2015-08-21: 88 mg via INTRAVENOUS

## 2015-08-21 MED ORDER — FENTANYL CITRATE (PF) 100 MCG/2ML IJ SOLN
INTRAMUSCULAR | Status: AC
  Filled 2015-08-21: qty 2

## 2015-08-21 MED ORDER — LORAZEPAM 2 MG/ML IJ SOLN
1.0000 mg | INTRAMUSCULAR | Status: AC
  Administered 2015-08-21: 1 mg via INTRAVENOUS
  Filled 2015-08-21: qty 1

## 2015-08-21 MED ORDER — METHYLPREDNISOLONE SODIUM SUCC 125 MG IJ SOLR
125.0000 mg | INTRAMUSCULAR | Status: AC
  Administered 2015-08-21: 125 mg via INTRAVENOUS
  Filled 2015-08-21: qty 2

## 2015-08-21 MED ORDER — MIDAZOLAM HCL 2 MG/2ML IJ SOLN
8.0000 mg | Freq: Once | INTRAMUSCULAR | Status: AC
  Administered 2015-08-21: 8 mg via INTRAVENOUS

## 2015-08-21 MED ORDER — FENTANYL CITRATE (PF) 100 MCG/2ML IJ SOLN
100.0000 ug | Freq: Once | INTRAMUSCULAR | Status: AC
  Administered 2015-08-21: 100 ug via INTRAVENOUS

## 2015-08-21 MED ORDER — MIDAZOLAM HCL 2 MG/2ML IJ SOLN
INTRAMUSCULAR | Status: AC
  Filled 2015-08-21: qty 8

## 2015-08-21 MED ORDER — MIDAZOLAM HCL 2 MG/2ML IJ SOLN
6.0000 mg | Freq: Once | INTRAMUSCULAR | Status: AC
  Administered 2015-08-21: 6 mg via INTRAVENOUS

## 2015-08-21 MED ORDER — MIDAZOLAM HCL 2 MG/2ML IJ SOLN
INTRAMUSCULAR | Status: AC
  Filled 2015-08-21: qty 6

## 2015-08-21 MED ORDER — SUCCINYLCHOLINE CHLORIDE 20 MG/ML IJ SOLN
INTRAMUSCULAR | Status: AC | PRN
  Administered 2015-08-21: 150 mg via INTRAVENOUS

## 2015-08-21 MED ORDER — ALBUTEROL (5 MG/ML) CONTINUOUS INHALATION SOLN
INHALATION_SOLUTION | RESPIRATORY_TRACT | Status: AC
  Administered 2015-08-21: 18:00:00
  Filled 2015-08-21: qty 20

## 2015-08-21 MED ORDER — LORAZEPAM 2 MG/ML IJ SOLN
1.0000 mg | Freq: Once | INTRAMUSCULAR | Status: AC
  Administered 2015-08-21: 1 mg via INTRAVENOUS

## 2015-08-21 MED ORDER — PROPOFOL 1000 MG/100ML IV EMUL
5.0000 ug/kg/min | INTRAVENOUS | Status: DC
  Administered 2015-08-22 (×4): 80 ug/kg/min via INTRAVENOUS
  Administered 2015-08-22: 70 ug/kg/min via INTRAVENOUS
  Filled 2015-08-21 (×5): qty 100

## 2015-08-21 NOTE — ED Notes (Signed)
Informed EDP that pt is still not sedated enough, pt writing notes to RN stating "this sh"t hurts" referring to tube. Verbal order for fentanyl and versed

## 2015-08-21 NOTE — ED Notes (Addendum)
Pts friend would like to be updated with plan of care - Felecia. [phone]

## 2015-08-21 NOTE — ED Notes (Addendum)
10mcg bolus Propofol  

## 2015-08-21 NOTE — ED Notes (Addendum)
 bolus propofol

## 2015-08-21 NOTE — ED Notes (Signed)
Pt appears very anxious, is hyperventilating and crying with Bipap on. O2-100%, Hr-118, respiratory is at bedside and pt encouraged to slow her breathing and try to relax.

## 2015-08-21 NOTE — ED Provider Notes (Signed)
CSN: [id number]     Arrival date & time 08/21/15  1751 History   First MD Initiated Contact with Patient 08/21/15 1804     Chief Complaint  Patient presents with  . Shortness of Breath   40 year old AAF with past medical history of asthma, COPD, anxiety, vocal cord dysfunction, and HTN who presents today with acute onset of shortness of breath and stridor. Patient says she has not been taking her Xanax as prescribed. She's had a cough and shortness of breath worsening over the past few days. Any other history is not obtainable as patient in respiratory distress.  (Consider location/radiation/quality/duration/timing/severity/associated sxs/prior Treatment) Patient is a 40 y.o. female presenting with shortness of breath.  Shortness of Breath Severity:  Severe Onset quality:  Sudden Timing:  Constant Progression:  Worsening Chronicity:  Recurrent Relieved by:  Nothing Worsened by:  Nothing tried Ineffective treatments:  None tried Associated symptoms: wheezing   Associated symptoms: no abdominal pain, no chest pain, no fever, no headaches and no vomiting     Past Medical History  Diagnosis Date  . Asthma   . Anxiety   . Bronchitis   . Heart murmur   . Shortness of breath   . Hypertension   . Vocal cord dysfunction   . Chronic narcotic dependence [facility](HCC)    Past Surgical History  Procedure Laterality Date  . Spinal fusion  11/2012    [facility] Dr. Mayford KnifeWilliams  . Tracheostomy  December 2015   Family History  Problem Relation Age of Onset  . HIV Mother   . Heart disease Father   . CVA Father   . Heart disease Other   . Emphysema Maternal Grandmother     smoked  . Asthma Sister   . Clotting disorder Sister   . Clotting disorder Maternal Grandmother   . Lung cancer Maternal Grandmother     smoked   Social History  Substance Use Topics  . Smoking status: Former Smoker -- 2.00 packs/day for 17 years    Types: Cigarettes    Quit date: 06/15/2014  .  Smokeless tobacco: Never Used  . Alcohol Use: 0.0 oz/week    0 Standard drinks or equivalent per week     Comment: occasional   OB History    No data available     Review of Systems  Constitutional: Negative for fever and chills.  Respiratory: Positive for shortness of breath, wheezing and stridor.   Cardiovascular: Negative for chest pain, palpitations and leg swelling.  Gastrointestinal: Negative for nausea, vomiting, abdominal pain, diarrhea, constipation and abdominal distention.  Genitourinary: Negative for dysuria, frequency, flank pain and decreased urine volume.  Neurological: Negative for dizziness, speech difficulty, light-headedness and headaches.  All other systems reviewed and are negative.     Allergies  Robitussin dm; Chocolate; Suboxone; Nsaids; Rayon, purified; and Tramadol  Home Medications   Prior to Admission medications   Medication Sig Start Date End Date Taking? Authorizing Provider  acetaminophen-codeine (TYLENOL #3) 300-30 MG tablet Take 2 tablets by mouth every 12 (twelve) hours as needed for moderate pain. 08/06/15   Jaclyn ShaggyEnobong Amao, MD  albuterol (PROAIR HFA) 108 (90 BASE) MCG/ACT inhaler Inhale 2 puffs into the lungs every 6 (six) hours as needed for wheezing or shortness of breath. 07/29/15   Vassie Lollarlos Madera, MD  alprazolam Prudy Feeler(XANAX) 2 MG tablet Take 2 mg by mouth as directed. She takes scheduled twice daily and then may take additional tablet during day if needed for panic attack  04/30/15   Historical Provider, MD  arformoterol (BROVANA) 15 MCG/2ML NEBU Take 2 mLs (15 mcg total) by nebulization 2 (two) times daily. 07/29/15   Vassie Loll, MD  azelastine (ASTELIN) 0.1 % nasal spray Place 2 sprays into both nostrils 2 (two) times daily. Use in each nostril as directed Patient taking differently: Place 2 sprays into both nostrils daily. Use in each nostril as directed 04/30/15   Ripudeep K Rai, MD  budesonide (PULMICORT) 0.5 MG/2ML nebulizer solution Take 2 mLs  (0.5 mg total) by nebulization 2 (two) times daily. 07/29/15   Vassie Loll, MD  buprenorphine-naloxone (SUBOXONE) 8-2 MG SUBL SL tablet Reported on 08/06/2015 06/22/15   Historical Provider, MD  docusate sodium (COLACE) 100 MG capsule Take 1 capsule (100 mg total) by mouth 2 (two) times daily. Patient taking differently: Take 100 mg by mouth daily as needed for mild constipation or moderate constipation.  04/30/15   Ripudeep Jenna Luo, MD  ENSURE PLUS (ENSURE PLUS) LIQD Take 237 mLs by mouth 3 (three) times daily between meals.    Historical Provider, MD  FLUoxetine (PROZAC) 40 MG capsule Take 1 capsule (40 mg total) by mouth at bedtime. 08/06/15   Jaclyn Shaggy, MD  fluticasone (FLONASE) 50 MCG/ACT nasal spray Place 2 sprays into both nostrils 2 (two) times daily. Patient taking differently: Place 2 sprays into both nostrils daily.  04/30/15   Ripudeep Jenna Luo, MD  guaiFENesin (MUCINEX) 600 MG 12 hr tablet Take 1 tablet (600 mg total) by mouth 2 (two) times daily. 07/29/15   Vassie Loll, MD  ipratropium-albuterol (DUONEB) 0.5-2.5 (3) MG/3ML SOLN Take 3 mLs by nebulization every 4 (four) hours as needed (shortness of breath/wheezing). 07/29/15   Vassie Loll, MD  morphine (MSIR) 30 MG tablet Take 30 mg by mouth every 12 (twelve) hours. Reported on 08/06/2015    Historical Provider, MD  oxyCODONE (OXY IR/ROXICODONE) 5 MG immediate release tablet Take 15 mg by mouth every 6 (six) hours as needed for severe pain.    Historical Provider, MD  pantoprazole (PROTONIX) 40 MG tablet Take 1 tablet (40 mg total) by mouth daily. Patient not taking: Reported on 08/06/2015 06/06/15   Christiane Ha, MD  predniSONE (DELTASONE) 20 MG tablet Take 3 tablets by mouth daily X 2 days; then 2 tablets by mouth X 2 days; then 1 tablet by mouth X 2 days; then 1/2 tablet by mouth X 2 days and stop prednisone Patient not taking: Reported on 08/06/2015 07/29/15   Vassie Loll, MD  pregabalin (LYRICA) 75 MG capsule Take 1  capsule (75 mg total) by mouth 2 (two) times daily. Reported on 08/06/2015 08/06/15   Jaclyn Shaggy, MD  promethazine-codeine (PHENERGAN WITH CODEINE) 6.25-10 MG/5ML syrup 5 mls by mouth every 12 hours as needed for cough 08/06/15   Jaclyn Shaggy, MD  QUEtiapine (SEROQUEL) 100 MG tablet Take 1 tablet (100 mg total) by mouth at bedtime. 06/06/15   Christiane Ha, MD  zolpidem (AMBIEN) 10 MG tablet Take 10 mg by mouth at bedtime as needed. for sleep 04/30/15   Historical Provider, MD   BP 133/87 mmHg  Pulse 121  Resp 20  SpO2 100%  LMP 07/17/2015 Physical Exam  Constitutional: She is oriented to person, place, and time. She appears well-developed and well-nourished. No distress.  HENT:  Head: Normocephalic and atraumatic.  Neck: Normal range of motion.  Cardiovascular: Regular rhythm, normal heart sounds and intact distal pulses.  Exam reveals no gallop and no friction rub.  No murmur heard. tachycardia  Pulmonary/Chest: She is in respiratory distress. She has wheezes. She has no rales. She exhibits no tenderness.  stridor  Abdominal: Soft. Bowel sounds are normal. She exhibits no distension and no mass. There is no tenderness. There is no rebound and no guarding.  Lymphadenopathy:    She has no cervical adenopathy.  Neurological: She is alert and oriented to person, place, and time. No cranial nerve deficit. Coordination normal.  Skin: Skin is warm and dry. No rash noted. She is not diaphoretic.  Nursing note and vitals reviewed.   ED Course  .Critical Care Performed by: Rachelle Hora Authorized by: Rachelle Hora Total critical care time: 35 minutes Critical care time was exclusive of separately billable procedures and treating other patients. Critical care was necessary to treat or prevent imminent or life-threatening deterioration of the following conditions: respiratory failure. Critical care was time spent personally by me on the following activities: obtaining history from  patient or surrogate, re-evaluation of patient's condition, ordering and performing treatments and interventions, discussions with consultants, ordering and review of laboratory studies, ordering and review of radiographic studies, examination of patient and evaluation of patient's response to treatment. Subsequent provider of critical care: I assumed direction of critical care for this patient from another provider of my specialty.  .Intubation Date/Time: 08/21/2015 7:34 PM Performed by: Rachelle Hora Authorized by: Rachelle Hora Consent: Verbal consent obtained. Written consent obtained. Patient identity confirmed: verbally with patient Time out: Immediately prior to procedure a "time out" was called to verify the correct patient, procedure, equipment, support staff and site/side marked as required. Indications: respiratory distress Intubation method: video-assisted Patient status: sedated Preoxygenation: nonrebreather mask Sedatives: ketmine Paralytic: succinylcholine Tube size: 7.0 mm Tube type: cuffed Number of attempts: 1 Cords visualized: yes Post-procedure assessment: CO2 detector Breath sounds: equal Cuff inflated: yes ETT to lip: 22 cm Tube secured with: ETT holder Chest x-ray interpreted by me. Chest x-ray findings: endotracheal tube in appropriate position Patient tolerance: Patient tolerated the procedure well with no immediate complications   (including critical care time) Labs Review Labs Reviewed  CBC WITH DIFFERENTIAL/PLATELET - Abnormal; Notable for the following:    WBC 11.2 (*)    MCV 75.7 (*)    MCH 24.3 (*)    RDW 17.4 (*)    Platelets 463 (*)    All other components within normal limits  BASIC METABOLIC PANEL - Abnormal; Notable for the following:    Glucose, Bld 105 (*)    Creatinine, Ser 1.27 (*)    GFR calc non Af Amer 53 (*)    All other components within normal limits  I-STAT ARTERIAL BLOOD GAS, ED - Abnormal; Notable for the following:    pH,  Arterial 7.323 (*)    pO2, Arterial 139.0 (*)    Acid-base deficit 4.0 (*)    All other components within normal limits  I-STAT VENOUS BLOOD GAS, ED - Abnormal; Notable for the following:    pH, Ven 7.376 (*)    Bicarbonate 28.2 (*)    All other components within normal limits  TRIGLYCERIDES    Imaging Review Dg Chest Portable 1 View  08/21/2015  CLINICAL DATA:  Hypertensive and tachycardia earlier. Intubated. History of asthma and vocal cord dysfunction. EXAM: PORTABLE CHEST 1 VIEW COMPARISON:  Chest x-rays from earlier same day and from 07/27/2015, 04/24/2015 and 03/25/2015. FINDINGS: Endotracheal tube in place, well positioned with tip approximately 2.5 cm above the level of the carina. Heart size is normal. Overall  cardiomediastinal silhouette is normal in size and configuration. There is mild central pulmonary vascular congestion which may be related to resuscitation efforts. Lungs otherwise clear. No pleural effusions seen. No pneumothorax seen. Osseous structures about the chest are unremarkable. IMPRESSION: 1. Endotracheal tube well positioned with tip approximately 2.5 cm above the level of the carina. 2. Perhaps mild central pulmonary vascular congestion which may be related to resuscitation efforts. Lungs otherwise clear. 3. Heart size is normal. Electronically Signed   By: Bary Richard M.D.   On: 08/21/2015 20:31   Dg Chest Port 1 View  08/21/2015  CLINICAL DATA:  Respiratory distress EXAM: PORTABLE CHEST 1 VIEW COMPARISON:  07/27/2015 FINDINGS: The heart size and mediastinal contours are within normal limits. Both lungs are clear. The visualized skeletal structures are unremarkable. IMPRESSION: No active disease. Electronically Signed   By: Signa Kell M.D.   On: 08/21/2015 18:56   I have personally reviewed and evaluated these images and lab results as part of my medical decision-making.   EKG Interpretation   Date/Time:  Friday August 21 2015 18:09:50 EST Ventricular Rate:   122 PR Interval:  146 QRS Duration: 87 QT Interval:  270 QTC Calculation: 385 R Axis:   64 Text Interpretation:  Sinus tachycardia Probable anteroseptal infarct, old  Borderline ST depression, inferior leads Baseline wander in lead(s) I II  III aVR aVF No significant change since last tracing Confirmed by FLOYD  MD, Reuel Boom ([id number]) on 08/21/2015 6:31:06 PM      MDM   Final diagnoses:  SOB (shortness of breath)  Respiratory distress  Stridor  COPD exacerbation (HCC)  Wheezing   40 year old female and respirator distress. See history of present illness for details. Patient is tachycardic in the 140s. Appears to be in distress, expiratory wheezing heard in all fields, increased work of breathing. Has audible stridor. Chart review shows extensive history of similar presentations and known vocal cord dysplasia. Has had a trach in the past for difficult airway and several admissions for resp distress without intubation necessary. A large component of this appears to be anxiety. Chest x-ray shows no sign of pneumonia or acute respiratory disease. No fluid overload. Labs pending.  Given Ativan, DuoNeb, Solu-Medrol, magnesium.  Patient remains tachycardic in the 140s and 150s. Still has increased work of breathing. Plan for intubation.  7:35 PM intubated for WOB. Video assisted w/ketamine. Cords visualized, no swelling, but spasm noted. Paralyzed and intubated as above.   Admit to ICU. Likely COPD exacerbation w/vocal cord spasm. Rec'd steroids, mag, duonebs.    Pt was seen under the supervision of Dr. Adela Lank.     Rachelle Hora, MD 08/21/15 2252  Melene Plan, DO 08/22/15 1526

## 2015-08-21 NOTE — ED Notes (Signed)
Called xray for portable

## 2015-08-21 NOTE — ED Notes (Signed)
Informed charge RN about need for safety sitter, pt beginning to move around again

## 2015-08-21 NOTE — ED Notes (Signed)
Pt here for SOB and acute respiratory distress. Pt struggling to breathe at triage.

## 2015-08-21 NOTE — Progress Notes (Signed)
abg collected  

## 2015-08-21 NOTE — Progress Notes (Signed)
Pt placed on BIPAP 100% 12/5 RR 15 with CAT running at 8L. Pt only tolerated for a few minutes. BIPAP removed, and pt placed back on CAT at 8L. Pt still extremely anxious at this time.

## 2015-08-21 NOTE — ED Notes (Addendum)
 bolus Propofol

## 2015-08-21 NOTE — ED Notes (Signed)
 propofol bolus

## 2015-08-22 ENCOUNTER — Inpatient Hospital Stay (HOSPITAL_COMMUNITY): Payer: Commercial Managed Care - HMO

## 2015-08-22 ENCOUNTER — Encounter (HOSPITAL_COMMUNITY): Payer: Self-pay

## 2015-08-22 DIAGNOSIS — F1120 Opioid dependence, uncomplicated: Secondary | ICD-10-CM | POA: Diagnosis present

## 2015-08-22 DIAGNOSIS — J45901 Unspecified asthma with (acute) exacerbation: Secondary | ICD-10-CM | POA: Diagnosis not present

## 2015-08-22 DIAGNOSIS — F411 Generalized anxiety disorder: Secondary | ICD-10-CM | POA: Diagnosis present

## 2015-08-22 DIAGNOSIS — Z886 Allergy status to analgesic agent status: Secondary | ICD-10-CM | POA: Diagnosis not present

## 2015-08-22 DIAGNOSIS — I10 Essential (primary) hypertension: Secondary | ICD-10-CM | POA: Diagnosis present

## 2015-08-22 DIAGNOSIS — J45909 Unspecified asthma, uncomplicated: Secondary | ICD-10-CM | POA: Diagnosis not present

## 2015-08-22 DIAGNOSIS — R061 Stridor: Secondary | ICD-10-CM | POA: Diagnosis not present

## 2015-08-22 DIAGNOSIS — G609 Hereditary and idiopathic neuropathy, unspecified: Secondary | ICD-10-CM | POA: Diagnosis present

## 2015-08-22 DIAGNOSIS — D72829 Elevated white blood cell count, unspecified: Secondary | ICD-10-CM | POA: Diagnosis present

## 2015-08-22 DIAGNOSIS — Z7951 Long term (current) use of inhaled steroids: Secondary | ICD-10-CM | POA: Diagnosis not present

## 2015-08-22 DIAGNOSIS — Z91018 Allergy to other foods: Secondary | ICD-10-CM | POA: Diagnosis not present

## 2015-08-22 DIAGNOSIS — M545 Low back pain: Secondary | ICD-10-CM | POA: Diagnosis present

## 2015-08-22 DIAGNOSIS — Z87891 Personal history of nicotine dependence: Secondary | ICD-10-CM | POA: Diagnosis not present

## 2015-08-22 DIAGNOSIS — G894 Chronic pain syndrome: Secondary | ICD-10-CM | POA: Diagnosis present

## 2015-08-22 DIAGNOSIS — J9601 Acute respiratory failure with hypoxia: Secondary | ICD-10-CM | POA: Diagnosis not present

## 2015-08-22 DIAGNOSIS — K5900 Constipation, unspecified: Secondary | ICD-10-CM | POA: Diagnosis present

## 2015-08-22 DIAGNOSIS — Z825 Family history of asthma and other chronic lower respiratory diseases: Secondary | ICD-10-CM | POA: Diagnosis not present

## 2015-08-22 DIAGNOSIS — Z7952 Long term (current) use of systemic steroids: Secondary | ICD-10-CM | POA: Diagnosis not present

## 2015-08-22 DIAGNOSIS — R062 Wheezing: Secondary | ICD-10-CM | POA: Diagnosis present

## 2015-08-22 DIAGNOSIS — J9600 Acute respiratory failure, unspecified whether with hypoxia or hypercapnia: Secondary | ICD-10-CM | POA: Diagnosis not present

## 2015-08-22 DIAGNOSIS — F1494 Cocaine use, unspecified with cocaine-induced mood disorder: Secondary | ICD-10-CM | POA: Diagnosis not present

## 2015-08-22 DIAGNOSIS — N179 Acute kidney failure, unspecified: Secondary | ICD-10-CM | POA: Diagnosis not present

## 2015-08-22 DIAGNOSIS — G8929 Other chronic pain: Secondary | ICD-10-CM | POA: Diagnosis not present

## 2015-08-22 DIAGNOSIS — J449 Chronic obstructive pulmonary disease, unspecified: Secondary | ICD-10-CM | POA: Diagnosis present

## 2015-08-22 DIAGNOSIS — Z888 Allergy status to other drugs, medicaments and biological substances status: Secondary | ICD-10-CM | POA: Diagnosis not present

## 2015-08-22 DIAGNOSIS — F1910 Other psychoactive substance abuse, uncomplicated: Secondary | ICD-10-CM | POA: Diagnosis not present

## 2015-08-22 DIAGNOSIS — Z7289 Other problems related to lifestyle: Secondary | ICD-10-CM | POA: Diagnosis not present

## 2015-08-22 DIAGNOSIS — E669 Obesity, unspecified: Secondary | ICD-10-CM | POA: Diagnosis not present

## 2015-08-22 DIAGNOSIS — J383 Other diseases of vocal cords: Secondary | ICD-10-CM | POA: Diagnosis present

## 2015-08-22 DIAGNOSIS — Z79899 Other long term (current) drug therapy: Secondary | ICD-10-CM | POA: Diagnosis not present

## 2015-08-22 DIAGNOSIS — Z981 Arthrodesis status: Secondary | ICD-10-CM | POA: Diagnosis not present

## 2015-08-22 DIAGNOSIS — R000 Tachycardia, unspecified: Secondary | ICD-10-CM | POA: Diagnosis present

## 2015-08-22 LAB — BASIC METABOLIC PANEL
ANION GAP: 15 (ref 5–15)
BUN: 11 mg/dL (ref 6–20)
CALCIUM: 9.5 mg/dL (ref 8.9–10.3)
CO2: 19 mmol/L — ABNORMAL LOW (ref 22–32)
CREATININE: 1.17 mg/dL — AB (ref 0.44–1.00)
Chloride: 109 mmol/L (ref 101–111)
GFR calc non Af Amer: 59 mL/min — ABNORMAL LOW (ref >60)
Glucose, Bld: 167 mg/dL — ABNORMAL HIGH (ref 65–99)
Potassium: 4.1 mmol/L (ref 3.5–5.1)
SODIUM: 143 mmol/L (ref 135–145)

## 2015-08-22 LAB — MAGNESIUM: MAGNESIUM: 2.3 mg/dL (ref 1.7–2.4)

## 2015-08-22 LAB — URINALYSIS, ROUTINE W REFLEX MICROSCOPIC
Bilirubin Urine: NEGATIVE
GLUCOSE, UA: NEGATIVE mg/dL
Hgb urine dipstick: NEGATIVE
KETONES UR: NEGATIVE mg/dL
LEUKOCYTES UA: NEGATIVE
NITRITE: NEGATIVE
PROTEIN: NEGATIVE mg/dL
Specific Gravity, Urine: 1.033 — ABNORMAL HIGH (ref 1.005–1.030)
pH: 5 (ref 5.0–8.0)

## 2015-08-22 LAB — CBC
HCT: 36.9 % (ref 36.0–46.0)
HEMOGLOBIN: 11 g/dL — AB (ref 12.0–15.0)
MCH: 23.6 pg — ABNORMAL LOW (ref 26.0–34.0)
MCHC: 29.8 g/dL — AB (ref 30.0–36.0)
MCV: 79 fL (ref 78.0–100.0)
Platelets: 344 10*3/uL (ref 150–400)
RBC: 4.67 MIL/uL (ref 3.87–5.11)
RDW: 17.8 % — AB (ref 11.5–15.5)
WBC: 15.9 10*3/uL — AB (ref 4.0–10.5)

## 2015-08-22 LAB — RAPID URINE DRUG SCREEN, HOSP PERFORMED
Amphetamines: NOT DETECTED
Barbiturates: NOT DETECTED
Benzodiazepines: POSITIVE — AB
Cocaine: POSITIVE — AB
Opiates: POSITIVE — AB
Tetrahydrocannabinol: NOT DETECTED

## 2015-08-22 LAB — URINE MICROSCOPIC-ADD ON
RBC / HPF: NONE SEEN RBC/hpf (ref 0–5)
WBC UA: NONE SEEN WBC/hpf (ref 0–5)

## 2015-08-22 LAB — MRSA PCR SCREENING: MRSA by PCR: NEGATIVE

## 2015-08-22 LAB — GLUCOSE, CAPILLARY: Glucose-Capillary: 146 mg/dL — ABNORMAL HIGH (ref 65–99)

## 2015-08-22 LAB — PHOSPHORUS: PHOSPHORUS: 3.4 mg/dL (ref 2.5–4.6)

## 2015-08-22 MED ORDER — FAMOTIDINE 20 MG PO TABS
20.0000 mg | ORAL_TABLET | Freq: Two times a day (BID) | ORAL | Status: DC
  Filled 2015-08-22 (×2): qty 1

## 2015-08-22 MED ORDER — ALPRAZOLAM 0.5 MG PO TABS
1.0000 mg | ORAL_TABLET | Freq: Three times a day (TID) | ORAL | Status: DC | PRN
  Administered 2015-08-22: 1 mg via ORAL
  Administered 2015-08-23: 0.5 mg via ORAL
  Administered 2015-08-23 – 2015-08-24 (×5): 1 mg via ORAL
  Filled 2015-08-22 (×7): qty 2

## 2015-08-22 MED ORDER — OXYCODONE HCL 5 MG PO TABS: 5.0000 mg | ORAL_TABLET | Freq: Once | ORAL | Status: AC

## 2015-08-22 MED ORDER — OXYCODONE HCL 5 MG PO TABS
5.0000 mg | ORAL_TABLET | ORAL | Status: DC | PRN
  Administered 2015-08-22 – 2015-09-01 (×32): 5 mg via ORAL
  Filled 2015-08-22 (×35): qty 1

## 2015-08-22 MED ORDER — ARFORMOTEROL TARTRATE 15 MCG/2ML IN NEBU
15.0000 ug | INHALATION_SOLUTION | Freq: Two times a day (BID) | RESPIRATORY_TRACT | Status: DC
  Administered 2015-08-22 – 2015-09-01 (×20): 15 ug via RESPIRATORY_TRACT
  Filled 2015-08-22 (×23): qty 2

## 2015-08-22 MED ORDER — IPRATROPIUM-ALBUTEROL 0.5-2.5 (3) MG/3ML IN SOLN
RESPIRATORY_TRACT | Status: AC
  Filled 2015-08-22: qty 3

## 2015-08-22 MED ORDER — PREDNISONE 20 MG PO TABS
30.0000 mg | ORAL_TABLET | Freq: Every day | ORAL | Status: DC
  Administered 2015-08-22 – 2015-08-24 (×3): 30 mg via ORAL
  Filled 2015-08-22 (×4): qty 1

## 2015-08-22 MED ORDER — PHENOL 1.4 % MT LIQD
1.0000 | OROMUCOSAL | Status: DC | PRN
  Administered 2015-08-26: 1 via OROMUCOSAL
  Filled 2015-08-22 (×2): qty 177

## 2015-08-22 MED ORDER — ENOXAPARIN SODIUM 40 MG/0.4ML ~~LOC~~ SOLN
40.0000 mg | Freq: Every day | SUBCUTANEOUS | Status: DC
  Administered 2015-08-22 – 2015-09-01 (×11): 40 mg via SUBCUTANEOUS
  Filled 2015-08-22 (×11): qty 0.4

## 2015-08-22 MED ORDER — FAMOTIDINE IN NACL 20-0.9 MG/50ML-% IV SOLN
20.0000 mg | Freq: Two times a day (BID) | INTRAVENOUS | Status: DC
  Administered 2015-08-22: 20 mg via INTRAVENOUS
  Filled 2015-08-22 (×2): qty 50

## 2015-08-22 MED ORDER — PROPOFOL 1000 MG/100ML IV EMUL
INTRAVENOUS | Status: AC
  Administered 2015-08-22: 80 ug/kg/min via INTRAVENOUS
  Filled 2015-08-22: qty 100

## 2015-08-22 MED ORDER — PANTOPRAZOLE SODIUM 40 MG PO TBEC
40.0000 mg | DELAYED_RELEASE_TABLET | Freq: Every day | ORAL | Status: DC
  Administered 2015-08-23 – 2015-09-01 (×10): 40 mg via ORAL
  Filled 2015-08-22 (×10): qty 1

## 2015-08-22 MED ORDER — BUDESONIDE 0.5 MG/2ML IN SUSP
0.5000 mg | Freq: Two times a day (BID) | RESPIRATORY_TRACT | Status: DC
  Administered 2015-08-22 – 2015-09-01 (×20): 0.5 mg via RESPIRATORY_TRACT
  Filled 2015-08-22 (×22): qty 2

## 2015-08-22 MED ORDER — IPRATROPIUM-ALBUTEROL 0.5-2.5 (3) MG/3ML IN SOLN
3.0000 mL | RESPIRATORY_TRACT | Status: DC | PRN
  Administered 2015-08-22 – 2015-08-28 (×8): 3 mL via RESPIRATORY_TRACT
  Filled 2015-08-22 (×9): qty 3

## 2015-08-22 MED ORDER — ANTISEPTIC ORAL RINSE SOLUTION (CORINZ)
7.0000 mL | Freq: Four times a day (QID) | OROMUCOSAL | Status: DC
  Administered 2015-08-22 (×3): 7 mL via OROMUCOSAL

## 2015-08-22 MED ORDER — CHLORHEXIDINE GLUCONATE 0.12% ORAL RINSE (MEDLINE KIT)
15.0000 mL | Freq: Two times a day (BID) | OROMUCOSAL | Status: DC
  Administered 2015-08-22: 15 mL via OROMUCOSAL

## 2015-08-22 MED ORDER — SODIUM CHLORIDE 0.9 % IV SOLN: 250.0000 mL | INTRAVENOUS | Status: DC | PRN

## 2015-08-22 NOTE — Progress Notes (Signed)
Self-extubated. Alert and oriented x3. Oxygen Sats 97 percent on room air. Chest clear. Dr Victorino SparrowMquaid made aware of event.

## 2015-08-22 NOTE — Progress Notes (Signed)
[facility]  Chart reviewed Well known to us> frequent admissions for vocal cord dysfunction, asthma exacerbations  Intubated in ED Self extubated in ICU  She tells me that she panicked yesterday and that is why she got intubated  Doing fine now post extubation, O2 saturation 100% on RA  Will add back home meds including PPI, brovana/pulmicort, give low dose prednisone  On exam : usual hoarseness, no lower airway wheezing, normal respiratory effor  Will move to SDU  Will follow  Heber CarolinaBrent Mkenzie Dotts, MD [place] [facility] Pager: [phone] Cell: [phone] After 3pm or if no response, call [phone]

## 2015-08-22 NOTE — Progress Notes (Signed)
Utilization review completed.  

## 2015-08-22 NOTE — ED Notes (Signed)
Spoke with Dr Craige CottaSood, prefers to have patient seen in ED by CC fellow.

## 2015-08-22 NOTE — Progress Notes (Signed)
Pt transferred to ICU 4, report given to unit RRT. RN at bedside

## 2015-08-22 NOTE — ED Notes (Signed)
Spoke with McQuid MD, said that fellows were "behind" and he did not have an update for me.

## 2015-08-22 NOTE — ED Notes (Signed)
Admitting to be paged.

## 2015-08-22 NOTE — ED Notes (Signed)
[facility] critical care @ [phone]

## 2015-08-22 NOTE — ED Notes (Signed)
Report attempted 

## 2015-08-22 NOTE — Progress Notes (Signed)
Pinked -tinged urine, dr Henrene Pastormcquiad made aware. Will continue to monitor.

## 2015-08-22 NOTE — H&P (Signed)
PULMONARY / CRITICAL CARE MEDICINE   Name: Julie Kerr MRN: [id number] DOB: 12/23/1975    ADMISSION DATE:  08/21/2015 CONSULTATION DATE: 08/21/15  CHIEF COMPLAINT:  Difficulty breathing  HISTORY OF PRESENT ILLNESS:   Julie Kerr is a 40F w/ history of vocal cord dysfunction with multiple prior intubations and prior tracheotomy who presented with complaints of increased work of breathing, wheezing and cough. Attempts were made to palliate her symptoms with benzos and biPAP, but her respiratory distress persisted and she was intubated. Per report she had run out of her Xanax. No additional history was able to be obtained.   PAST MEDICAL HISTORY :  She  has a past medical history of Asthma; Anxiety; Bronchitis; Heart murmur; Shortness of breath; Hypertension; Vocal cord dysfunction; and Chronic narcotic dependence (HCC).  PAST SURGICAL HISTORY: She  has past surgical history that includes Spinal fusion (11/2012) and Tracheostomy (December 2015).  Allergies  Allergen Reactions  . Robitussin Dm [Dextromethorphan-Guaifenesin] Nausea And Vomiting  . Chocolate Hives  . Suboxone [Buprenorphine Hcl-Naloxone Hcl] Other (See Comments)    Aggressive behavior  . Nsaids Hives  . Rayon, Purified Hives  . Tramadol Hives    No current facility-administered medications on file prior to encounter.   Current Outpatient Prescriptions on File Prior to Encounter  Medication Sig  . acetaminophen-codeine (TYLENOL #3) 300-30 MG tablet Take 2 tablets by mouth every 12 (twelve) hours as needed for moderate pain.  Marland Kitchen. albuterol (PROAIR HFA) 108 (90 BASE) MCG/ACT inhaler Inhale 2 puffs into the lungs every 6 (six) hours as needed for wheezing or shortness of breath.  . alprazolam (XANAX) 2 MG tablet Take 2 mg by mouth as directed. She takes scheduled twice daily and then may take additional tablet during day if needed for panic attack  . arformoterol (BROVANA) 15 MCG/2ML NEBU Take 2 mLs (15 mcg total) by nebulization  2 (two) times daily.  Marland Kitchen. azelastine (ASTELIN) 0.1 % nasal spray Place 2 sprays into both nostrils 2 (two) times daily. Use in each nostril as directed (Patient taking differently: Place 2 sprays into both nostrils daily. Use in each nostril as directed)  . budesonide (PULMICORT) 0.5 MG/2ML nebulizer solution Take 2 mLs (0.5 mg total) by nebulization 2 (two) times daily.  . buprenorphine-naloxone (SUBOXONE) 8-2 MG SUBL SL tablet Reported on 08/06/2015  . docusate sodium (COLACE) 100 MG capsule Take 1 capsule (100 mg total) by mouth 2 (two) times daily. (Patient taking differently: Take 100 mg by mouth daily as needed for mild constipation or moderate constipation. )  . ENSURE PLUS (ENSURE PLUS) LIQD Take 237 mLs by mouth 3 (three) times daily between meals.  Marland Kitchen. FLUoxetine (PROZAC) 40 MG capsule Take 1 capsule (40 mg total) by mouth at bedtime.  . fluticasone (FLONASE) 50 MCG/ACT nasal spray Place 2 sprays into both nostrils 2 (two) times daily. (Patient taking differently: Place 2 sprays into both nostrils daily. )  . guaiFENesin (MUCINEX) 600 MG 12 hr tablet Take 1 tablet (600 mg total) by mouth 2 (two) times daily.  Marland Kitchen. ipratropium-albuterol (DUONEB) 0.5-2.5 (3) MG/3ML SOLN Take 3 mLs by nebulization every 4 (four) hours as needed (shortness of breath/wheezing).  . morphine (MSIR) 30 MG tablet Take 30 mg by mouth every 12 (twelve) hours. Reported on 08/06/2015  . oxyCODONE (OXY IR/ROXICODONE) 5 MG immediate release tablet Take 15 mg by mouth every 6 (six) hours as needed for severe pain.  . pantoprazole (PROTONIX) 40 MG tablet Take 1 tablet (40 mg total) by  mouth daily. (Patient not taking: Reported on 08/06/2015)  . predniSONE (DELTASONE) 20 MG tablet Take 3 tablets by mouth daily X 2 days; then 2 tablets by mouth X 2 days; then 1 tablet by mouth X 2 days; then 1/2 tablet by mouth X 2 days and stop prednisone (Patient not taking: Reported on 08/06/2015)  . pregabalin (LYRICA) 75 MG capsule Take 1 capsule  (75 mg total) by mouth 2 (two) times daily. Reported on 08/06/2015  . promethazine-codeine (PHENERGAN WITH CODEINE) 6.25-10 MG/5ML syrup 5 mls by mouth every 12 hours as needed for cough  . QUEtiapine (SEROQUEL) 100 MG tablet Take 1 tablet (100 mg total) by mouth at bedtime.  Marland Kitchen zolpidem (AMBIEN) 10 MG tablet Take 10 mg by mouth at bedtime as needed. for sleep    FAMILY HISTORY:  Her indicated that her mother is deceased. She indicated that her father is deceased. She indicated that her other is deceased.   SOCIAL HISTORY: She  reports that she quit smoking about 14 months ago. Her smoking use included Cigarettes. She has a 34 pack-year smoking history. She has never used smokeless tobacco. She reports that she drinks alcohol. She reports that she does not use illicit drugs.  REVIEW OF SYSTEMS:   Unable to obtain.  SUBJECTIVE:  Sedated, intubated.   VITAL SIGNS: BP 135/75 mmHg  Pulse 127  Resp 20  SpO2 100%  LMP 07/17/2015  HEMODYNAMICS:    VENTILATOR SETTINGS: Vent Mode:  [-] PRVC FiO2 (%):  [40 %-50 %] 40 % Set Rate:  [18 bmp-20 bmp] 20 bmp Vt Set:  [500 mL] 500 mL PEEP:  [5 cmH20] 5 cmH20 Plateau Pressure:  [16 cmH20-17 cmH20] 17 cmH20  INTAKE / OUTPUT:    PHYSICAL EXAMINATION: General:  Obese AAF sedated Neuro:  Arouses to voice. Grossly intact HEENT:  No gross abnormalities. ETT/OGT in place Cardiovascular:  Tachy. S1, s2, no m/r/g, distal pulses palpable Lungs:  Clear to auscultation bilaterally with no wheezes, rales or ronchi. Vent-assisted effort. Symmetrical expansion. Abdomen:  Obese. Soft. Non-tender. Positive bowel sounds Musculoskeletal:  Moves all extremities. No gross abnormalities. Skin:  No rashes / sores / ulcers. No cyanosis or clubbing.   LABS:  BMET  Recent Labs Lab 08/21/15 1805  NA 143  K 4.3  CL 108  CO2 24  BUN 17  CREATININE 1.27*  GLUCOSE 105*    Electrolytes  Recent Labs Lab 08/21/15 1805  CALCIUM 9.9     CBC  Recent Labs Lab 08/21/15 1805  WBC 11.2*  HGB 12.2  HCT 38.1  PLT 463*    Coag's No results for input(s): APTT, INR in the last 168 hours.  Sepsis Markers No results for input(s): LATICACIDVEN, PROCALCITON, O2SATVEN in the last 168 hours.  ABG  Recent Labs Lab 08/21/15 2058  PHART 7.323*  PCO2ART 42.6  PO2ART 139.0*    Liver Enzymes No results for input(s): AST, ALT, ALKPHOS, BILITOT, ALBUMIN in the last 168 hours.  Cardiac Enzymes No results for input(s): TROPONINI, PROBNP in the last 168 hours.  Glucose No results for input(s): GLUCAP in the last 168 hours.  Imaging Dg Chest Portable 1 View  08/21/2015  CLINICAL DATA:  Hypertensive and tachycardia earlier. Intubated. History of asthma and vocal cord dysfunction. EXAM: PORTABLE CHEST 1 VIEW COMPARISON:  Chest x-rays from earlier same day and from 07/27/2015, 04/24/2015 and 03/25/2015. FINDINGS: Endotracheal tube in place, well positioned with tip approximately 2.5 cm above the level of the carina. Heart size is  normal. Overall cardiomediastinal silhouette is normal in size and configuration. There is mild central pulmonary vascular congestion which may be related to resuscitation efforts. Lungs otherwise clear. No pleural effusions seen. No pneumothorax seen. Osseous structures about the chest are unremarkable. IMPRESSION: 1. Endotracheal tube well positioned with tip approximately 2.5 cm above the level of the carina. 2. Perhaps mild central pulmonary vascular congestion which may be related to resuscitation efforts. Lungs otherwise clear. 3. Heart size is normal. Electronically Signed   By: Bary Richard M.D.   On: 08/21/2015 20:31   Dg Chest Port 1 View  08/21/2015  CLINICAL DATA:  Respiratory distress EXAM: PORTABLE CHEST 1 VIEW COMPARISON:  07/27/2015 FINDINGS: The heart size and mediastinal contours are within normal limits. Both lungs are clear. The visualized skeletal structures are unremarkable. IMPRESSION:  No active disease. Electronically Signed   By: Signa Kell M.D.   On: 08/21/2015 18:56     STUDIES:  CXR as above  CULTURES: None  ANTIBIOTICS: None  SIGNIFICANT EVENTS: Failed BiPAP -> intubation  LINES/TUBES: ETT 08/21/15 Foley 08/21/15 PIVs  DISCUSSION: Ms. Sussman is a 39yo AAF w/ history of vocal cord dysfunction provoked by anxiety who was reportedly out of her anxiolytics who presented with respiratory distress, stridor, wheezing and cough.   ASSESSMENT / PLAN:  PULMONARY A: Acute respiratory failure 2/2 vocal cord dysfunction No hypoxia. No evidence of coexistent asthma exacerbation Hx cocaine use, which may have contributed  P:   Ventilatory support Wean as tolerated Will need adequate anxiolysis for extubation attempt - precedex? Check UDS  CARDIOVASCULAR A:  Sinus tachycardia  P:  Fluid resuscitate Adequate analgesia / anxiolysis  RENAL A:   AKI Cr 1.27 (0.87) - etiology unclear; likely volume-associated  P:   IVF Trend Cr UA pending If no improvement with fluids, check urine lytes, consider renal ultrasound  GASTROINTESTINAL A:   No acute issues  P:   NPO Stress ulcer ppx w/ famotidine  HEMATOLOGIC A:   No acute issues. Hgb 12.2  P:  Monitor DVT ppx w / lovenox  INFECTIOUS A:   No s/s of infection  P:   No antibiotics for now. Monitor  ENDOCRINE A:   No acute issues    P:   Monitor  NEUROLOGIC / PSYCHIATRIC A:   Anxiety  P:   RASS goal: 0 Adequate anxiolysis Consider Precedex  FAMILY  - Updates: no family available  - Inter-disciplinary family meet or Palliative Care meeting due by:  d7  CRITICAL CARE Performed by: Orville Govern, MD   Total critical care time: 34 minutes  Critical care time was exclusive of separately billable procedures and treating other patients.  Critical care was necessary to treat or prevent imminent or life-threatening deterioration.  Critical care was time spent  personally by me on the following activities: development of treatment plan with patient and/or surrogate as well as nursing, discussions with consultants, evaluation of patient's response to treatment, examination of patient, obtaining history from patient or surrogate, ordering and performing treatments and interventions, ordering and review of laboratory studies, ordering and review of radiographic studies, pulse oximetry and re-evaluation of patient's condition.  Nita Sickle, MD Pulmonary and Critical Care Medicine [facility] Pager: [phone]  08/22/2015, 1:02 AM

## 2015-08-23 DIAGNOSIS — J45901 Unspecified asthma with (acute) exacerbation: Secondary | ICD-10-CM

## 2015-08-23 DIAGNOSIS — R061 Stridor: Secondary | ICD-10-CM

## 2015-08-23 DIAGNOSIS — G8929 Other chronic pain: Secondary | ICD-10-CM

## 2015-08-23 LAB — BASIC METABOLIC PANEL
ANION GAP: 6 (ref 5–15)
Anion gap: 8 (ref 5–15)
BUN: 12 mg/dL (ref 6–20)
BUN: 10 mg/dL (ref 6–20)
CALCIUM: 8.8 mg/dL — AB (ref 8.9–10.3)
CALCIUM: 9 mg/dL (ref 8.9–10.3)
CO2: 24 mmol/L (ref 22–32)
CO2: 26 mmol/L (ref 22–32)
Chloride: 105 mmol/L (ref 101–111)
Chloride: 103 mmol/L (ref 101–111)
Creatinine, Ser: 0.75 mg/dL (ref 0.44–1.00)
Creatinine, Ser: 0.76 mg/dL (ref 0.44–1.00)
GFR calc Af Amer: >60 mL/min (ref >60)
GFR calc Af Amer: >60 mL/min (ref >60)
GFR calc non Af Amer: >60 mL/min (ref >60)
GLUCOSE: 112 mg/dL — AB (ref 65–99)
GLUCOSE: 102 mg/dL — AB (ref 65–99)
Potassium: 5.7 mmol/L — ABNORMAL HIGH (ref 3.5–5.1)
Potassium: 3.9 mmol/L (ref 3.5–5.1)
SODIUM: 135 mmol/L (ref 135–145)
Sodium: 137 mmol/L (ref 135–145)

## 2015-08-23 MED ORDER — PREGABALIN 75 MG PO CAPS
75.0000 mg | ORAL_CAPSULE | Freq: Two times a day (BID) | ORAL | Status: DC
  Administered 2015-08-23 – 2015-09-01 (×18): 75 mg via ORAL
  Filled 2015-08-23 (×12): qty 1
  Filled 2015-08-23: qty 3
  Filled 2015-08-23 (×5): qty 1

## 2015-08-23 NOTE — Progress Notes (Addendum)
PULMONARY / CRITICAL CARE MEDICINE   Name: Julie Kerr MRN: [id number] DOB: 10/16/1975    ADMISSION DATE:  08/21/2015 CONSULTATION DATE: 08/21/15  CHIEF COMPLAINT:  Difficulty breathing  HISTORY OF PRESENT ILLNESS:   Ms. Julie Kerr is a 40F w/ history of vocal cord dysfunction with multiple prior intubations and prior tracheotomy who presented with complaints of increased work of breathing, wheezing and cough. Attempts were made to palliate her symptoms with benzos and biPAP, but her respiratory distress persisted and she was intubated. Per report she had run out of her Xanax. No additional history was able to be obtained.    SUBJECTIVE:  Awake and hungry VITAL SIGNS: BP 119/88 mmHg  Pulse 84  Temp(Src) 98.2 F (36.8 C) (Oral)  Resp 23  Ht 5\' 3"  (1.6 m)  Wt 193 lb 9 oz (87.8 kg)  BMI 34.30 kg/m2  SpO2 99%  LMP 07/17/2015  HEMODYNAMICS:    VENTILATOR SETTINGS:    INTAKE / OUTPUT: I/O last 3 completed shifts: In: 983.8 [P.O.:420; I.V.:563.8] Out: 1920 [Urine:1920]  PHYSICAL EXAMINATION: General:  Obese AAF wants chicken soup Neuro:   Grossly intact HEENT:  No gross abnormalities.  Cardiovascular:  Tachy. S1, s2, no m/r/g, distal pulses palpable Lungs:  Clear to auscultation bilaterally Abdomen:  Obese. Soft. Non-tender. Positive bowel sounds Musculoskeletal:  Moves all extremities. No gross abnormalities. Skin:  No rashes / sores / ulcers. No cyanosis or clubbing.   LABS:  BMET  Recent Labs Lab 08/22/15 0401 08/23/15 0543 08/23/15 0815  NA 143 135 137  K 4.1 5.7* 3.9  CL 109 105 103  CO2 19* 24 26  BUN 11 12 10   CREATININE 1.17* 0.75 0.76  GLUCOSE 167* 112* 102*    Electrolytes  Recent Labs Lab 08/22/15 0401 08/23/15 0543 08/23/15 0815  CALCIUM 9.5 8.8* 9.0  MG 2.3  --   --   PHOS 3.4  --   --     CBC  Recent Labs Lab 08/21/15 1805 08/22/15 0401  WBC 11.2* 15.9*  HGB 12.2 11.0*  HCT 38.1 36.9  PLT 463* 344    Coag's No results for  input(s): APTT, INR in the last 168 hours.  Sepsis Markers No results for input(s): LATICACIDVEN, PROCALCITON, O2SATVEN in the last 168 hours.  ABG  Recent Labs Lab 08/21/15 2058  PHART 7.323*  PCO2ART 42.6  PO2ART 139.0*    Liver Enzymes No results for input(s): AST, ALT, ALKPHOS, BILITOT, ALBUMIN in the last 168 hours.  Cardiac Enzymes No results for input(s): TROPONINI, PROBNP in the last 168 hours.  Glucose  Recent Labs Lab 08/22/15 0234  GLUCAP 146*    Imaging No results found.   STUDIES:  CXR as above  CULTURES: None  ANTIBIOTICS: None  SIGNIFICANT EVENTS: Failed BiPAP -> intubation  LINES/TUBES: ETT 08/21/15 Foley 08/21/15 PIVs  DISCUSSION: Ms. Julie Kerr is a 40yo AAF w/ history of vocal cord dysfunction provoked by anxiety who was reportedly out of her anxiolytics who presented with respiratory distress, stridor, wheezing and cough.   ASSESSMENT / PLAN:  PULMONARY A: Acute respiratory failure 2/2 vocal cord dysfunction No hypoxia. No evidence of coexistent asthma exacerbation Hx cocaine use, which may have contributed  P:   Ventilatory support Wean as tolerated Will need adequate anxiolysis for extubation attempt - precedex? Check UDS. Cocaine +  CARDIOVASCULAR A:  Sinus tachycardia  P:  Fluid resuscitate Adequate analgesia / anxiolysis  RENAL Lab Results  Component Value Date   CREATININE 0.76 08/23/2015  CREATININE 0.75 08/23/2015   CREATININE 1.17* 08/22/2015    A:   AKI Cr 1.27 (0.87) - etiology unclear; likely volume-associated  P:   IVF Trend Cr UA pending If no improvement with fluids, check urine lytes, consider renal ultrasound  GASTROINTESTINAL A:   No acute issues  P:   NPO Stress ulcer ppx w/ famotidine  HEMATOLOGIC A:   No acute issues. Hgb 12.2  P:  Monitor DVT ppx w / lovenox  INFECTIOUS A:   No s/s of infection  P:   No antibiotics for now. Monitor  ENDOCRINE A:   No acute issues     P:   Monitor  NEUROLOGIC / PSYCHIATRIC A:   Anxiety  P:   Low dose anxiolytics   FAMILY  - Updates: no family available  - Inter-disciplinary family meet or Palliative Care meeting due by:  d7  CRITICAL CARE   Self extubation 1/7. Now in SDU. Remains a full code. [facility] to assume her care.   Brett Canales Minor ACNP [facility] Pager [phone] till 3 pm If no answer page [phone] 08/23/2015, 11:50 AM

## 2015-08-23 NOTE — Care Management Note (Signed)
Case Management Note  Patient Details  Name: Adele Schildermanda Hefner MRN: [id number] Date of Birth: 01/28/1976  Subjective/Objective:                Admitted with difficulties breathing. History of vocal cord dysfunction provoked by anxiety who was reportedly out of her anxiolytics. Pt states pta independent with ADL's. Lives alone. States uses walker with ambulation. PCP: Jaclyn ShaggyEnobong Amao.   Action/Plan: Return to home when medically stable. CM to f/u with disposition needs.  Expected Discharge Date:                  Expected Discharge Plan:  Home/Self Care  In-House Referral:     Discharge planning Services  CM Consult  Post Acute Care Choice:    Choice offered to:     DME Arranged:    DME Agency:     HH Arranged:    HH Agency:     Status of Service:  In process, will continue to follow  Medicare Important Message Given:    Date Medicare IM Given:    Medicare IM give by:    Date Additional Medicare IM Given:    Additional Medicare Important Message give by:     If discussed at Long Length of Stay Meetings, dates discussed:    Additional Comments:  Epifanio LeschesCole, Maryfer Tauzin Hudson, [place]RN,BSN,CM [phone] 08/23/2015, 12:27 PM

## 2015-08-23 NOTE — Progress Notes (Signed)
Notified MD of lab result K+ 5.7.  MD ordered STAT lab repeat.  Patient asymptomatic.  Will continue to monitor.

## 2015-08-24 DIAGNOSIS — E669 Obesity, unspecified: Secondary | ICD-10-CM | POA: Diagnosis present

## 2015-08-24 DIAGNOSIS — J9600 Acute respiratory failure, unspecified whether with hypoxia or hypercapnia: Secondary | ICD-10-CM

## 2015-08-24 DIAGNOSIS — G894 Chronic pain syndrome: Secondary | ICD-10-CM

## 2015-08-24 DIAGNOSIS — N179 Acute kidney failure, unspecified: Secondary | ICD-10-CM

## 2015-08-24 DIAGNOSIS — J45909 Unspecified asthma, uncomplicated: Secondary | ICD-10-CM

## 2015-08-24 MED ORDER — FLUTICASONE PROPIONATE 50 MCG/ACT NA SUSP
2.0000 | Freq: Every day | NASAL | Status: DC
  Administered 2015-08-25 – 2015-09-01 (×6): 2 via NASAL
  Filled 2015-08-24 (×4): qty 16

## 2015-08-24 MED ORDER — QUETIAPINE FUMARATE 50 MG PO TABS
100.0000 mg | ORAL_TABLET | Freq: Every day | ORAL | Status: DC
  Administered 2015-08-24: 100 mg via ORAL
  Filled 2015-08-24: qty 2

## 2015-08-24 MED ORDER — BENZONATATE 100 MG PO CAPS
200.0000 mg | ORAL_CAPSULE | Freq: Three times a day (TID) | ORAL | Status: DC | PRN
  Administered 2015-08-24 – 2015-08-30 (×9): 200 mg via ORAL
  Filled 2015-08-24 (×9): qty 2

## 2015-08-24 MED ORDER — FLUOXETINE HCL 20 MG PO CAPS
40.0000 mg | ORAL_CAPSULE | Freq: Every day | ORAL | Status: DC
  Administered 2015-08-24 – 2015-08-31 (×8): 40 mg via ORAL
  Filled 2015-08-24 (×8): qty 2

## 2015-08-24 MED ORDER — ZOLPIDEM TARTRATE 5 MG PO TABS
10.0000 mg | ORAL_TABLET | Freq: Every evening | ORAL | Status: DC | PRN
  Administered 2015-08-24 – 2015-08-31 (×7): 10 mg via ORAL
  Filled 2015-08-24 (×8): qty 2

## 2015-08-24 NOTE — Progress Notes (Signed)
New Admission Note: transfer 3S  Arrival Method: bed Mental Orientation: a/o x4 Telemetry: none Assessment: Completed Skin: clean dry intact IV: LFA SL Pain: none Tubes: none Safety Measures: Safety Fall Prevention Plan has been given, discussed and signed Admission: Completed Unit Orientation: Patient has been orientated to the room, unit and staff.  Family: no family present upon arrival  Orders have been reviewed and implemented. Will continue to monitor the patient. Call light has been placed within reach and bed alarm has been activated.   Janeann ForehandLuke Ceria Suminski BSN, RN
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
## 2015-08-24 NOTE — Progress Notes (Signed)
PULMONARY / CRITICAL CARE MEDICINE   Name: Julie Kerr MRN: [id number] DOB: 1976/03/05    ADMISSION DATE:  08/21/2015 CONSULTATION DATE: 08/21/15  CHIEF COMPLAINT:  Difficulty breathing  CULTURES: None  ANTIBIOTICS: None  SIGNIFICANT EVENTS: Failed BiPAP -> intubation  LINES/TUBES: ETT 08/21/15 - 08/22/15 (self-extubation) PIVs  HISTORY OF PRESENT ILLNESS:   Julie Kerr is a 89F w/ history of vocal cord dysfunction with multiple prior intubations and prior tracheotomy who presented with complaints of increased work of breathing, wheezing and cough. Attempts were made to palliate her symptoms with benzos and biPAP, but her respiratory distress persisted and she was intubated. Per report she had run out of her Xanax. No additional history was able to be obtained.   SUBJECTIVE: Patient reports improving dyspnea. Intermittent coughing. Reports diffuse musculoskeletal pain. Feels Xanax sedates her too much and pain is more of an issue.  REVIEW OF SYSTEMS:  Denies any fever or chills. No nausea or emesis.  VITAL SIGNS: BP 118/65 mmHg  Pulse 73  Temp(Src) 98.1 F (36.7 C) (Oral)  Resp 16  Ht 5\' 3"  (1.6 m)  Wt 198 lb 13.7 oz (90.2 kg)  BMI 35.23 kg/m2  SpO2 99%  LMP 07/17/2015  HEMODYNAMICS:    VENTILATOR SETTINGS:    INTAKE / OUTPUT: I/O last 3 completed shifts: In: 2460 [P.O.:2460] Out: 2650 [Urine:2650]  PHYSICAL EXAMINATION: General:  Female. No distress. Awake. Resting comfortably until I entered the room. Neuro:   Moving all 4 extremities equally. CN grossly in tact.  HEENT:  MMM. No oral ulcers. No scleral icterus or injection. Cardiovascular:  Regular rate. No edema. No JVD. Lungs:  CTAB. Normal WOB. No stridor or wheezing. Skin:  Warm & dry. No rash on exposed skin.  LABS:  BMET  Recent Labs Lab 08/22/15 0401 08/23/15 0543 08/23/15 0815  NA 143 135 137  K 4.1 5.7* 3.9  CL 109 105 103  CO2 19* 24 26  BUN 11 12 10   CREATININE 1.17* 0.75 0.76  GLUCOSE  167* 112* 102*    Electrolytes  Recent Labs Lab 08/22/15 0401 08/23/15 0543 08/23/15 0815  CALCIUM 9.5 8.8* 9.0  MG 2.3  --   --   PHOS 3.4  --   --     CBC  Recent Labs Lab 08/21/15 1805 08/22/15 0401  WBC 11.2* 15.9*  HGB 12.2 11.0*  HCT 38.1 36.9  PLT 463* 344    Coag's No results for input(s): APTT, INR in the last 168 hours.  Sepsis Markers No results for input(s): LATICACIDVEN, PROCALCITON, O2SATVEN in the last 168 hours.  ABG  Recent Labs Lab 08/21/15 2058  PHART 7.323*  PCO2ART 42.6  PO2ART 139.0*    Liver Enzymes No results for input(s): AST, ALT, ALKPHOS, BILITOT, ALBUMIN in the last 168 hours.  Cardiac Enzymes No results for input(s): TROPONINI, PROBNP in the last 168 hours.  Glucose  Recent Labs Lab 08/22/15 0234  GLUCAP 146*    Imaging No results found.   DISCUSSION:   ASSESSMENT / PLAN:  Julie Kerr is a 40yo AAF w/ history of vocal cord dysfunction provoked by anxiety who was reportedly out of her anxiolytics who presented with respiratory distress, stridor, wheezing and cough. Has known vocal chord dsyfunction that is likely contributing. Patient self-extubated 1/7 and has remained stable. Acute renal failure has resolved.   1. Acute Respiratory Failure secondary to Vocal Cord Dysfunction:  Continuing reassurance along with Xanax prn. 2. Acute Renal Failure:  Resolved. Foley catheter removed.  3. Anxiety/Pain:  Continuing Xanax & oxycodone prn. Lyrica bid. 4. H/O Asthma:  No signs of acute exacerbation. Continuing Brovana & Budesonide. Stopping Prednisone. 5. Diet:  Regular diet. 6. Prophylaxis:  Protonix PO daily & Lovenox Wilkes daily  Remainder or care per primary service. PCCM will sign off. Recommend benzos and reassurance for any stridor with vocal cord dysfunction.   Donna ChristenJennings E. Jamison NeighborNestor, M.D. [facility] Pager:  [phone] After 3pm or if no response, call [phone]  08/24/2015, 12:04 PM

## 2015-08-24 NOTE — Progress Notes (Signed)
Continuous productive cough (thick tan sputum) with wheezing.  O2 sats remained 97-100%, notified RT for PRN neb.  Provided neb tx and notified Dr. Lendell CapriceSullivan of findings. Still receiving the neb PRN tx-will continue to monitor for changes.

## 2015-08-25 DIAGNOSIS — J383 Other diseases of vocal cords: Secondary | ICD-10-CM

## 2015-08-25 MED ORDER — QUETIAPINE FUMARATE 50 MG PO TABS
100.0000 mg | ORAL_TABLET | Freq: Two times a day (BID) | ORAL | Status: DC
  Administered 2015-08-25 – 2015-09-01 (×14): 100 mg via ORAL
  Filled 2015-08-25 (×16): qty 2

## 2015-08-25 MED ORDER — CHLORDIAZEPOXIDE HCL 5 MG PO CAPS
10.0000 mg | ORAL_CAPSULE | Freq: Three times a day (TID) | ORAL | Status: DC
  Administered 2015-08-25 – 2015-08-27 (×5): 10 mg via ORAL
  Filled 2015-08-25 (×7): qty 2

## 2015-08-25 NOTE — Progress Notes (Signed)
[facility] PROGRESS NOTE  Adele Schildermanda Ipock WUJ:[id number] DOB: 06/29/1976 DOA: 08/21/2015 PCP: Jaclyn ShaggyEnobong, Amao, MD  Summary 40 year old female with VCD, anxiety, polysubstance abuse and drug seeking behavior, recurrent admissions for same. Admitted with respiratory failure and intubated in ED. [facility]  Assessment/Plan:  Principal Problem:   Respiratory failure (HCC) secondary to VCD, cocaine use.  Anxiety component a major factor. stable Active Problems:   Chronic pain syndrome: continue current. Will not escalate opiates. Felt to have a drug seeking component in the past   Generalized anxiety disorder: seen by psychiatry and medications adjusted   Polysubstance abuse, counseled.   Vocal cord dysfunction   Obesity Deconditioning: PT rec SNF. Patient agrees  Code Status:  full Family Communication:  Sister 1/9 Disposition Plan:  home   HPI/Subjective: Agreeable to SNF. Mainly complaining about visit with psychiatry, does not mention breathing difficulty  Objective: Filed Vitals:   08/25/15 0858 08/25/15 1708  BP: 103/53 102/66  Pulse: 89 93  Temp: 98.2 F (36.8 C) 99.2 F (37.3 C)  Resp: 17 18    Intake/Output Summary (Last 24 hours) at 08/25/15 2042 Last data filed at 08/25/15 1905  Gross per 24 hour  Intake    720 ml  Output   1400 ml  Net   -680 ml   Filed Weights   08/23/15 0331 08/24/15 0341 08/24/15 2131  Weight: 87.8 kg (193 lb 9 oz) 90.2 kg (198 lb 13.7 oz) 92.7 kg (204 lb 5.9 oz)    Exam:   General:  As yesterday, Initially asleep. When i entered the room, patient began hyperventilating, crying. No respiratory distress  Cardiovascular: RRR without MGR  Respiratory: CTA without WRR  Abdomen: s, nt, nd  Ext: no CCE  Basic Metabolic Panel:  Recent Labs Lab 08/21/15 1805 08/22/15 0401 08/23/15 0543 08/23/15 0815  NA 143 143 135 137  K 4.3 4.1 5.7* 3.9  CL 108 109 105 103  CO2 24 19* 24 26  GLUCOSE 105*  167* 112* 102*  BUN 17 11 12 10   CREATININE 1.27* 1.17* 0.75 0.76  CALCIUM 9.9 9.5 8.8* 9.0  MG  --  2.3  --   --   PHOS  --  3.4  --   --    Liver Function Tests: No results for input(s): AST, ALT, ALKPHOS, BILITOT, PROT, ALBUMIN in the last 168 hours. No results for input(s): LIPASE, AMYLASE in the last 168 hours. No results for input(s): AMMONIA in the last 168 hours. CBC:  Recent Labs Lab 08/21/15 1805 08/22/15 0401  WBC 11.2* 15.9*  NEUTROABS 7.2  --   HGB 12.2 11.0*  HCT 38.1 36.9  MCV 75.7* 79.0  PLT 463* 344   Cardiac Enzymes: No results for input(s): CKTOTAL, CKMB, CKMBINDEX, TROPONINI in the last 168 hours. BNP (last 3 results) No results for input(s): BNP in the last 8760 hours.  ProBNP (last 3 results) No results for input(s): PROBNP in the last 8760 hours.  CBG:  Recent Labs Lab 08/22/15 0234  GLUCAP 146*    Recent Results (from the past 240 hour(s))  MRSA PCR Screening     Status: None   Collection Time: 08/22/15  2:26 AM  Result Value Ref Range Status   MRSA by PCR NEGATIVE NEGATIVE Final    Comment:        The GeneXpert MRSA Assay (FDA approved for NASAL specimens only), is one component of a comprehensive MRSA colonization surveillance program. It  is not intended to diagnose MRSA infection nor to guide or monitor treatment for MRSA infections.      Studies: No results found.  Scheduled Meds: . arformoterol  15 mcg Nebulization BID  . budesonide (PULMICORT) nebulizer solution  0.5 mg Nebulization BID  . chlordiazePOXIDE  10 mg Oral TID  . enoxaparin (LOVENOX) injection  40 mg Subcutaneous Daily  . FLUoxetine  40 mg Oral QHS  . fluticasone  2 spray Each Nare Daily  . pantoprazole  40 mg Oral Q1200  . pregabalin  75 mg Oral BID  . QUEtiapine  100 mg Oral BID   Continuous Infusions:   Time spent: 25 minutes  Teryl Mcconaghy L  [facility] [email or web address], password [facility] 08/25/2015, 8:42 PM  LOS: 3 days

## 2015-08-25 NOTE — Progress Notes (Signed)
Entered patient's room at family's request.  The family member was the patient's sister, Olegario MessierKathy.  Provided incentive spirometer at family's request.  Assisted family member with changing the patient's linens and bathing the patient.  Attempted to answer patient questions.  Unable to answer all questions to the satisfaction of the the family member.  Provided patient with breathing treatment, per order.  Peri MarisAndrew Tashaun Obey, MBA, BS, RN

## 2015-08-25 NOTE — Evaluation (Signed)
Physical Therapy Evaluation Patient Details Name: Doloris Servantes MRN: [id number] DOB: 07-Oct-1975 Today's Date: 08/25/2015   History of Present Illness  31F w/ history of vocal cord dysfunction with multiple prior intubations and prior tracheotomy who presented with complaints of increased work of breathing, wheezing and cough. Attempts were made to palliate her symptoms with benzos and biPAP, but her respiratory distress persisted and she was intubated.  Clinical Impression  Pt is a 40 y.o. Female with an extensive medical history whom PTA was requiring nursing help for all ADL/IADLs. Pt was living at home alone but has a home health RN that comes 7x/week from 3-6pm. She reports she falls atleast 3x/week and depends on RN to get her out of the bed, bath her, and cook her food. Otherwise she stays in bed and even uses bathroom in the bed.   Pt has generalized strength, balance and coordination deficits which lead to weekly falls and is unsafe to return home w/o 24/7 assistance. Recommend ST-SNF to achieve safe mod I level of function for safe transition home.    Follow Up Recommendations SNF    Equipment Recommendations  None recommended by PT    Recommendations for Other Services       Precautions / Restrictions Precautions Precautions: Fall Restrictions Weight Bearing Restrictions: No      Mobility  Bed Mobility Overal bed mobility: Needs Assistance Bed Mobility: Supine to Sit     Supine to sit: Min assist;HOB elevated (Additional time and attempts needed for all mobility)     General bed mobility comments: very labored and c/o pain throughout session  Transfers Overall transfer level: Needs assistance Equipment used: Rolling walker (2 wheeled) Transfers: Sit to/from [employer] Sit to Stand: Mod assist;+2 physical assistance (unable to achieve upright posture with VC and tactile cues) Stand pivot transfers: Max assist;+2 physical assistance (shuffle step from  bed to chair)          Ambulation/Gait                Stairs            Wheelchair Mobility    Modified Rankin (Stroke Patients Only)       Balance   Sitting-balance support: Feet unsupported;Bilateral upper extremity supported       Standing balance support: Bilateral upper extremity supported                                 Pertinent Vitals/Pain Pain Assessment: 0-10 Pain Score: 10-Worst pain ever Pain Location: back and legs Pain Intervention(s): Premedicated before session;Monitored during session;Patient requesting pain meds-RN notified    Home Living Family/patient expects to be discharged to:: Private residence (vs Skilled Nursing Facility) Living Arrangements: Alone (Nurse comes over for three hours a day) Available Help at Discharge: Available PRN/intermittently (3:00pm-6:00pm) Type of Home: House Home Access: Stairs to enter   [employer] of Steps: 5   Home Equipment: Walker - 2 wheels;Shower seat;Bedside commode      Prior Function Level of Independence: Needs assistance   Gait / Transfers Assistance Needed: Ambulates w/ RW at baseline, nurse required for all transfers  ADL's / Homemaking Assistance Needed: Pt reports nurse comes 7 days a week to assist with all ADLs/IADLs. Stays in bed until nurse comes at 3PM  Comments: reports falling at least 3 times a week     Hand Dominance        Extremity/Trunk  Assessment   Upper Extremity Assessment: Generalized weakness           Lower Extremity Assessment: Generalized weakness (Pt requiring use of UE to perform any LE movement)      Cervical / Trunk Assessment: Kyphotic  Communication   Communication: No difficulties  Cognition Arousal/Alertness: Awake/alert Behavior During Therapy: Flat affect Overall Cognitive Status:  (History of anxiety and psychatric health conditions)                      General Comments      Exercises General  Exercises - Lower Extremity Ankle Circles/Pumps: AROM;Seated Gluteal Sets: AROM;Seated      Assessment/Plan    PT Assessment Patient needs continued PT services  PT Diagnosis Generalized weakness;Difficulty walking   PT Problem List Decreased strength;Decreased range of motion;Decreased activity tolerance;Decreased balance;Decreased mobility;Decreased coordination;Decreased safety awareness;Pain  PT Treatment Interventions Gait training;Functional mobility training;Therapeutic exercise;Balance training;Neuromuscular re-education;Wheelchair mobility training   PT Goals (Current goals can be found in the Care Plan section) Acute Rehab PT Goals Patient Stated Goal: Go to rehab to get stronger PT Goal Formulation: With patient Time For Goal Achievement: 09/08/15 Potential to Achieve Goals: Fair    Frequency Min 3X/week   Barriers to discharge Decreased caregiver support;Inaccessible home environment      Co-evaluation               End of Session Equipment Utilized During Treatment: Gait belt Activity Tolerance: Patient limited by pain (pt self limiting) Patient left: with chair alarm set;in chair Nurse Communication: Mobility status         Time: [id number] PT Time Calculation (min) (ACUTE ONLY): 34 min   Charges:   PT Evaluation $PT Eval Moderate Complexity: 1 Procedure PT Treatments $Therapeutic Activity: 8-22 mins   PT G CodesMarcene Brawn:        Manasvi Dickard Marie 08/25/2015, 9:46 AM   Lewis ShockAshly Haywood Meinders, PT, DPT Pager #: [phone] Office #: [phone]

## 2015-08-25 NOTE — Care Management Important Message (Signed)
Important Message  Patient Details  Name: Julie Kerr MRN: [id number] Date of Birth: 08/15/1975   Medicare Important Message Given:  Yes    [name] 08/25/2015, 4:05 PM

## 2015-08-25 NOTE — NC FL2 (Signed)
[place] MEDICAID FL2 LEVEL OF CARE SCREENING TOOL     IDENTIFICATION  Patient Name: Julie Kerr Birthdate: 12/15/1975 Sex: female Admission Date (Current Location): 08/21/2015  [place]  [place] [id number] [place] [facility]   [facility] [address]     Provider Number: [id number]  Attending Physician Name and Address:  Christiane Haorinna L Sullivan, MD  Relative Name and Phone Number:  [facility] HospitalKathy McClure-sister.  Phone# - [phone].    Current Level of Care: Hospital Recommended Level of Care: Skilled Nursing Facility Prior Approval Number:    Date Approved/Denied:   PASRR Number:    Discharge Plan: SNF    Current Diagnoses: Patient Active Problem List   Diagnosis Date Noted  . Obesity 08/24/2015  . Tobacco abuse   . Cocaine abuse   . Acute respiratory distress (HCC)   . Leukocytosis   . Anxiety and depression   . Substance abuse   . Asthma 07/27/2015  . Respiratory failure (HCC) 07/27/2015  . Vocal cord dysfunction   . Frequent falls 05/29/2015  . COPD (chronic obstructive pulmonary disease) (HCC) 05/08/2015  . Alcohol abuse   . Depression   . Rectal bleeding 10/08/2014  . Blood in stool   . Adjustment disorder with anxious mood   . Hereditary and idiopathic peripheral neuropathy   . Chronic asthmatic bronchitis (HCC) 08/21/2014  . Cough   . Polysubstance abuse 07/28/2014  . Hyperglycemia 05/07/2014  . Generalized anxiety disorder 01/20/2014  . Panic attacks 01/20/2014  . Upper airway cough syndrome, severe, with clinical VCD 12/07/2013  . Stridor 10/29/2013  . Anemia 08/04/2013  . Tobacco use disorder 06/17/2013  . Chronic pain syndrome 06/17/2013    Orientation RESPIRATION BLADDER Height & Weight    Self, Situation, Place  Normal Continent   204 lbs.  BEHAVIORAL SYMPTOMS/MOOD NEUROLOGICAL BOWEL NUTRITION STATUS      Continent Diet (Regular diet)  AMBULATORY STATUS COMMUNICATION OF NEEDS Skin   Extensive Assist (Patient  did not ambulate during PT eval on 1/10) Verbally Normal                       Personal Care Assistance Level of Assistance  Bathing, Feeding, Dressing Bathing Assistance: Limited assistance Feeding assistance: Independent Dressing Assistance: Limited assistance     Functional Limitations Info  Sight, Hearing, Speech Sight Info: Adequate Hearing Info: Adequate Speech Info: Adequate    SPECIAL CARE FACTORS FREQUENCY  PT (By licensed PT)     PT Frequency: Evaluated 1/10. A minimum of 3X per week therapy recommended.              Contractures Contractures Info: Not present    Additional Factors Info  Code Status, Allergies Code Status Info: Full Code Allergies Info: Robitussin DM, Chocolate, Suboxone, Nsaids, Rayn-Purified, Tramadol.           Current Medications (08/25/2015):  This is the current hospital active medication list Current Facility-Administered Medications  Medication Dose Route Frequency Provider Last Rate Last Dose  . ALPRAZolam Prudy Feeler(XANAX) tablet 1 mg  1 mg Oral TID PRN Lupita Leashouglas B McQuaid, MD   1 mg at 08/24/15 2020  . arformoterol (BROVANA) nebulizer solution 15 mcg  15 mcg Nebulization BID Lupita Leashouglas B McQuaid, MD   15 mcg at 08/25/15 0757  . benzonatate (TESSALON) capsule 200 mg  200 mg Oral TID PRN Christiane Haorinna L Sullivan, MD   200 mg at 08/24/15 2116  . budesonide (PULMICORT) nebulizer solution 0.5 mg  0.5  mg Nebulization BID Lupita Leash, MD   0.5 mg at 08/25/15 0757  . enoxaparin (LOVENOX) injection 40 mg  40 mg Subcutaneous Daily Orville Govern, MD   40 mg at 08/25/15 1120  . FLUoxetine (PROZAC) capsule 40 mg  40 mg Oral QHS Christiane Ha, MD   40 mg at 08/24/15 2116  . fluticasone (FLONASE) 50 MCG/ACT nasal spray 2 spray  2 spray Each Nare Daily Christiane Ha, MD      . ipratropium-albuterol (DUONEB) 0.5-2.5 (3) MG/3ML nebulizer solution 3 mL  3 mL Nebulization Q4H PRN Lupita Leash, MD   3 mL at 08/25/15 1418  . oxyCODONE (Oxy  IR/ROXICODONE) immediate release tablet 5 mg  5 mg Oral Q4H PRN Lupita Leash, MD   5 mg at 08/25/15 1346  . pantoprazole (PROTONIX) EC tablet 40 mg  40 mg Oral Q1200 Lupita Leash, MD   40 mg at 08/25/15 1121  . phenol (CHLORASEPTIC) mouth spray 1 spray  1 spray Mouth/Throat PRN Lupita Leash, MD      . pregabalin (LYRICA) capsule 75 mg  75 mg Oral BID Oretha Milch, MD   75 mg at 08/25/15 1120  . QUEtiapine (SEROQUEL) tablet 100 mg  100 mg Oral QHS Christiane Ha, MD   100 mg at 08/24/15 2115  . zolpidem (AMBIEN) tablet 10 mg  10 mg Oral QHS PRN Christiane Ha, MD   10 mg at 08/24/15 2116     Discharge Medications: Please see discharge summary for a list of discharge medications.  Relevant Imaging Results:  Relevant Lab Results:   Additional Information    Okey Dupre, Lazaro Arms, LCSW

## 2015-08-25 NOTE — Consult Note (Signed)
[facility]   Reason for Consult:  Substance abuse and anxiety disorder Referring Physician:  Dr. Lendell CapriceSullivan Patient Identification: Julie Kerr MRN:  [id number] Principal Diagnosis: Polysubstance abuse Diagnosis:   Patient Active Problem List   Diagnosis Date Noted  . Obesity [E66.9] 08/24/2015  . Tobacco abuse [Z72.0]   . Cocaine abuse [F14.10]   . Acute respiratory distress (HCC) [R06.00]   . Leukocytosis [D72.829]   . Anxiety and depression [F41.8]   . Substance abuse [F19.10]   . Asthma [J45.909] 07/27/2015  . Respiratory failure (HCC) [J96.90] 07/27/2015  . Vocal cord dysfunction [J38.3]   . Frequent falls [R29.6] 05/29/2015  . COPD (chronic obstructive pulmonary disease) (HCC) [J44.9] 05/08/2015  . Alcohol abuse [F10.10]   . Depression [F32.9]   . Rectal bleeding [K62.5] 10/08/2014  . Blood in stool [K92.1]   . Adjustment disorder with anxious mood [F43.22]   . 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 Chronic asthmatic bronchitis (HCC) [J44.9, J45.909] 08/21/2014  . Cough [R05]   . Polysubstance abuse [F19.10] 07/28/2014  . Hyperglycemia [R73.9] 05/07/2014  . Generalized anxiety disorder [F41.1] 01/20/2014  . Panic attacks [F41.0] 01/20/2014  . Upper airway cough syndrome, severe, with clinical VCD [R05] 12/07/2013  . Stridor [R06.1] 10/29/2013  . Anemia [D64.9] 08/04/2013  . Tobacco use disorder [F17.200] 06/17/2013  . Chronic pain syndrome [G89.4] 06/17/2013    Total Time spent with patient: 1 hour  Subjective:   Julie Kerr is a 40 y.o. female patient admitted with substance intoxication and cough.  HPI:  Julie Kerr is a [age] female seen for psychiatric consultation and evaluation of increased symptoms of depression, anxiety and substance abuse. Patient reportedly smoking marijuana and abusing crack cocaine for the last 6 months, her last drug use is yesterday. Patient reported she has been living alone, disabled and  has a home health worker visit her. Patient reportedly received medication management while she was hospitalized during [facility]. [name] refuse to provide medication management for her and recommended counseling services. [facility] also refused to prescribe medications Xanax. Patient stated she has a side effect of the medication from Cymbalta and BuSpar which she cannot take it. Patient is willing to take her Prozac and Neurontin. Patient stated Neurontin does not work for her. Patient is currently taking Seroquel, Prozac and restarted as Xanax as needed. Patient denies suicidal ideation, homicidal ideation, auditory visual hallucinations. Patient reportedly has substance abuse treatment [facility] and are currently in the past she also has a pain management secondary to chronic back pain. Patient urine drug screen is positive for benzos, opiates and cocaine.  PAST MEDICAL HISTORY :  She  has a past medical history of Asthma; Anxiety; Bronchitis; Heart murmur; Shortness of breath; Hypertension; Vocal cord dysfunction; and Chronic narcotic dependence (HCC). She  has past surgical history that includes Spinal fusion (11/2012) and Tracheostomy (December 2015).  Past Psychiatric History: Patient has no behavioral health admissions but received [name].  Risk to Self: Is patient at risk for suicide?: No Risk to Others:   Prior Inpatient Therapy:   Prior Outpatient Therapy:    Past Medical History:  Past Medical History  Diagnosis Date  . Asthma   . Anxiety   . Bronchitis   . Heart murmur   . Shortness of  breath   . Hypertension   . Vocal cord dysfunction   . Chronic narcotic dependence [facility])     Past Surgical History  Procedure Laterality Date  . Spinal fusion  11/2012     Performed in [facility] by Dr. Mayford Knife  . Tracheostomy  December 2015   Family History:  Family History  Problem Relation Age of Onset  . HIV Mother   . Heart disease Father   . CVA Father   . Heart disease Other   . Emphysema Maternal Grandmother     smoked  . Asthma Sister   . Clotting disorder Sister   . Clotting disorder Maternal Grandmother   . Lung cancer Maternal Grandmother     smoked   Family Psychiatric  History: Patient denied family history of substance abuse and reportedly has a sister with panic attacks and heart problems.  Social History:  History  Alcohol Use  . 0.0 oz/week  . 0 Standard drinks or equivalent per week    Comment: occasional     History  Drug Use No    Social History   Social History  . Marital Status: Single    Spouse Name: N/A  . Number of Children: N/A  . Years of Education: N/A   Social History Main Topics  . Smoking status: Former Smoker -- 2.00 packs/day for 17 years    Types: Cigarettes    Quit date: 06/15/2014  . Smokeless tobacco: Never Used  . Alcohol Use: 0.0 oz/week    0 Standard drinks or equivalent per week     Comment: occasional  . Drug Use: No  . Sexual Activity: Yes    Birth Control/ Protection: Other-see comments     Comment: female partners only   Other Topics Concern  . None   Social History Narrative   Additional Social History: Living alone and walks with walker and home health visit her.                          Allergies:   Allergies  Allergen Reactions  . Robitussin Dm [Dextromethorphan-Guaifenesin] Nausea And Vomiting  . Chocolate Hives  . Suboxone [Buprenorphine Hcl-Naloxone Hcl] Other (See Comments)    Aggressive behavior  . Nsaids Hives  . Rayon, Purified Hives  . Tramadol Hives    Labs: No results found for this or any previous visit (from the past 48 hour(s)).  Current Facility-Administered Medications  Medication Dose Route Frequency Provider Last Rate Last Dose  .  ALPRAZolam Prudy Feeler) tablet 1 mg  1 mg Oral TID PRN Lupita Leash, MD   1 mg at 08/24/15 2020  . arformoterol (BROVANA) nebulizer solution 15 mcg  15 mcg Nebulization BID Lupita Leash, MD   15 mcg at 08/25/15 0757  . benzonatate (TESSALON) capsule 200 mg  200 mg Oral TID PRN Christiane Ha, MD   200 mg at 08/24/15 2116  . budesonide (PULMICORT) nebulizer solution 0.5 mg  0.5 mg Nebulization BID Lupita Leash, MD   0.5 mg at 08/25/15 0757  . enoxaparin (LOVENOX) injection 40 mg  40 mg Subcutaneous Daily Orville Govern, MD   40 mg at 08/25/15 1120  . FLUoxetine (PROZAC) capsule 40 mg  40 mg Oral QHS Christiane Ha, MD   40 mg at 08/24/15 2116  . fluticasone (FLONASE) 50 MCG/ACT nasal spray 2 spray  2 spray Each Nare Daily Christiane Ha, MD      .  ipratropium-albuterol (DUONEB) 0.5-2.5 (3) MG/3ML nebulizer solution 3 mL  3 mL Nebulization Q4H PRN Lupita Leash, MD   3 mL at 08/24/15 1048  . oxyCODONE (Oxy IR/ROXICODONE) immediate release tablet 5 mg  5 mg Oral Q4H PRN Lupita Leash, MD   5 mg at 08/25/15 0810  . pantoprazole (PROTONIX) EC tablet 40 mg  40 mg Oral Q1200 Lupita Leash, MD   40 mg at 08/25/15 1121  . phenol (CHLORASEPTIC) mouth spray 1 spray  1 spray Mouth/Throat PRN Lupita Leash, MD      . pregabalin (LYRICA) capsule 75 mg  75 mg Oral BID Oretha Milch, MD   75 mg at 08/25/15 1120  . QUEtiapine (SEROQUEL) tablet 100 mg  100 mg Oral QHS Christiane Ha, MD   100 mg at 08/24/15 2115  . zolpidem (AMBIEN) tablet 10 mg  10 mg Oral QHS PRN Christiane Ha, MD   10 mg at 08/24/15 2116    Musculoskeletal: Strength & Muscle Tone: decreased Gait & Station: unable to stand Patient leans: N/A  Psychiatric Specialty Exam: ROS patient complaining depression, anxiety, generalized weakness but denied nausea, vomiting, abdominal pain, shortness of breath and chest pain   Blood pressure 103/53, pulse 89, temperature 98.2 F (36.8 C), temperature  source Oral, resp. rate 17, height 5\' 3"  (1.6 m), weight 92.7 kg (204 lb 5.9 oz), last menstrual period 07/17/2015, SpO2 99 %.Body mass index is 36.21 kg/(m^2).  General Appearance: Guarded    Eye Contact::  Good  Speech:  Clear and Coherent  Volume:  Decreased  Mood:  Anxious and Depressed  Affect:  Constricted and Depressed  Thought Process:  Coherent and Goal Directed  Orientation:  Full (Time, Place, and Person)  Thought Content:  WDL  Suicidal Thoughts:  No  Homicidal Thoughts:  No  Memory:  Immediate;   Good Recent;   Good  Judgement:  Intact  Insight:  Fair  Psychomotor Activity:  Decreased  Concentration:  Good  Recall:  Good  Fund of Knowledge:Good  Language: Good  Akathisia:  Negative  Handed:  Right  AIMS (if indicated):     Assets:  Communication Skills Desire for Improvement Financial Resources/Insurance Housing Leisure Time Resilience Talents/Skills Transportation  ADL's:  Intact  Cognition: WNL  Sleep:      Treatment Plan Summary: Daily contact with patient to assess and evaluate symptoms and progress in treatment and Medication management  Patient has no safety concerns Change alprazolam to chlordiazepoxide 10 mg 3 times daily for anxiety and possible withdrawal symptoms Increase Seroquel 100 mg twice daily for mood swings Continue Prozac 40 mg daily for depression and anxiety Patient will be referred to the outpatient medical management and medically stable Appreciate psychiatric consultation and follow up as clinically required Please contact [phone] or [phone] if needs further assistance  Disposition: Patient does not meet criteria for psychiatric inpatient admission. Supportive therapy provided about ongoing stressors.  Dashon Mcintire,JANARDHAHA R. 08/25/2015 1:17 PM

## 2015-08-25 NOTE — Clinical Social Work Note (Signed)
Completed assessment with patient, full assessment to follow and initiated facility search. Patient will be provided with facility responses on Wednesday, 1/11.   Genelle BalVanessa Tamkia Temples, MSW, LCSW Licensed Clinical Social Worker Clinical Social Work Department [facility] [phone]

## 2015-08-26 ENCOUNTER — Inpatient Hospital Stay (HOSPITAL_COMMUNITY): Payer: Commercial Managed Care - HMO

## 2015-08-26 ENCOUNTER — Telehealth: Payer: Self-pay

## 2015-08-26 DIAGNOSIS — J9601 Acute respiratory failure with hypoxia: Principal | ICD-10-CM

## 2015-08-26 DIAGNOSIS — E669 Obesity, unspecified: Secondary | ICD-10-CM

## 2015-08-26 LAB — BLOOD GAS, ARTERIAL
Acid-base deficit: 1 mmol/L (ref 0.0–2.0)
BICARBONATE: 23.5 meq/L (ref 20.0–24.0)
DRAWN BY: 441371
FIO2: 0.21
O2 SAT: 90.4 %
Patient temperature: 98.6
TCO2: 24.8 mmol/L (ref 0–100)
pCO2 arterial: 41.3 mmHg (ref 35.0–45.0)
pH, Arterial: 7.374 (ref 7.350–7.450)
pO2, Arterial: 63.4 mmHg — ABNORMAL LOW (ref 80.0–100.0)

## 2015-08-26 LAB — BRAIN NATRIURETIC PEPTIDE: B NATRIURETIC PEPTIDE 5: 7.4 pg/mL (ref 0.0–100.0)

## 2015-08-26 MED ORDER — METHYLPREDNISOLONE SODIUM SUCC 125 MG IJ SOLR
80.0000 mg | Freq: Three times a day (TID) | INTRAMUSCULAR | Status: DC
  Administered 2015-08-26: 80 mg via INTRAVENOUS
  Filled 2015-08-26: qty 2

## 2015-08-26 MED ORDER — METHYLPREDNISOLONE SODIUM SUCC 125 MG IJ SOLR: 60.0000 mg | Freq: Once | INTRAMUSCULAR | Status: DC

## 2015-08-26 MED ORDER — METHYLPREDNISOLONE SODIUM SUCC 40 MG IJ SOLR
40.0000 mg | Freq: Two times a day (BID) | INTRAMUSCULAR | Status: DC
  Administered 2015-08-26 – 2015-08-27 (×2): 40 mg via INTRAVENOUS
  Filled 2015-08-26 (×2): qty 1

## 2015-08-26 NOTE — Progress Notes (Signed)
RT placed pt on [place] 2 Lpm due to ABG results and pt's home regime. RN notified.

## 2015-08-26 NOTE — Hospital Discharge Follow-Up (Signed)
This CM received a call from the patient requesting to talk about her plan for discharge to SNF. Jasmine Pang, RN CM updated that the patient would like to speak to  this CM about her discharge plan.  As per C. Royal, the SW is working on placement and will meet with the patient when she has information.  This CM met with the patient and she stated that Dominica, SW met with her and told her that there is a place on [facility] that she may go to but she ( patient) would like to know more information about the facility.  The patient said that she was not feeling good right now and was waiting for more information.  This CM explained that a follow up appointment at the [facility] will  be scheduled when her discharge plan is determined and she was agreeable.  She said that she will also need to speak to the provider at [facility] about a referral to a pain clinic.    Will continue to follow her hospital course.

## 2015-08-26 NOTE — Progress Notes (Addendum)
PROGRESS NOTE  Julie Kerr ZOX:[id number] DOB: 07/09/1976 DOA: 08/21/2015 PCP: Jaclyn ShaggyEnobong, Amao, MD  HPI/Recap of past 724 hours: A 40-year-old female with past oral history of chronic pain, polysubstance abuse and vocal cord dysfunction admitted on 1/6 with acute restaurateur failure and was intubated and then self extubated the following day. Stabilized and transferred to hospitalist service by 1/9. Seen by psychiatry with medication adjustments. Patient evaluated and recommended for skilled nursing facility, of which but offers are pending.  Today, patient complains of generalized abdominal pain. Feels very anxious. Significant wheezing noted by exam.  Assessment/Plan: Principal Problem:   Polysubstance abuse: Counseled. Active Problems:   Chronic pain syndrome: Continue chronic home medications without increasing dose   Generalized anxiety disorder: Seen by psychiatry, Xanax changed to Librium 3 times a day. Continue Seroquel and Prozac   Acute Respiratory failure (HCC) with hypoxia with underlying asthma and vocal cord dysfunction: Some of this may be tachypnea from anxiety. Will start IV Solu-Medrol and quickly taper. Chest x-ray notes minimal volume overload. Recent echo normal. However weight up 10 pounds from admission. We'll check stat BNP.    Obesity: Patient meets criteria with BMI greater than 30   Code Status: Full code  Family Communication: Left message with sister   Disposition Plan: Skilled nursing, anticipate in the next few days    Consultants:  Psychiatry  Critical care   Procedures:  Mechanical ventilation 1/6-1/7   Antibiotics:  None    Objective: BP 101/54 mmHg  Pulse 89  Temp(Src) 98.6 F (37 C) (Oral)  Resp 18  Ht 5\' 3"  (1.6 m)  Wt 92.748 kg (204 lb 7.6 oz)  BMI 36.23 kg/m2  SpO2 99%  LMP 07/17/2015  Intake/Output Summary (Last 24 hours) at 08/26/15 1722 Last data filed at 08/26/15 1540  Gross per 24 hour  Intake    820 ml  Output    3250 ml  Net  -2430 ml   Filed Weights   08/24/15 0341 08/24/15 2131 08/25/15 2304  Weight: 90.2 kg (198 lb 13.7 oz) 92.7 kg (204 lb 5.9 oz) 92.748 kg (204 lb 7.6 oz)    Exam:   General:  Alert and oriented 3, moderate distress secondary to anxiousness  Cardiovascular: Regular rate and rhythm, S1-S2, borderline tachycardia   Respiratory: Bilateral expiratory wheezing   Abdomen: Soft, nondistended, subjective diffuse tenderness, hypoactive bowel sounds   Musculoskeletal: No clubbing or cyanosis or edema    Data Reviewed: Basic Metabolic Panel:  Recent Labs Lab 08/21/15 1805 08/22/15 0401 08/23/15 0543 08/23/15 0815  NA 143 143 135 137  K 4.3 4.1 5.7* 3.9  CL 108 109 105 103  CO2 24 19* 24 26  GLUCOSE 105* 167* 112* 102*  BUN 17 11 12 10   CREATININE 1.27* 1.17* 0.75 0.76  CALCIUM 9.9 9.5 8.8* 9.0  MG  --  2.3  --   --   PHOS  --  3.4  --   --    Liver Function Tests: No results for input(s): AST, ALT, ALKPHOS, BILITOT, PROT, ALBUMIN in the last 168 hours. No results for input(s): LIPASE, AMYLASE in the last 168 hours. No results for input(s): AMMONIA in the last 168 hours. CBC:  Recent Labs Lab 08/21/15 1805 08/22/15 0401  WBC 11.2* 15.9*  NEUTROABS 7.2  --   HGB 12.2 11.0*  HCT 38.1 36.9  MCV 75.7* 79.0  PLT 463* 344   Cardiac Enzymes:   No results for input(s): CKTOTAL, CKMB, CKMBINDEX, TROPONINI in the  last 168 hours. BNP (last 3 results) No results for input(s): BNP in the last 8760 hours.  ProBNP (last 3 results) No results for input(s): PROBNP in the last 8760 hours.  CBG:  Recent Labs Lab 08/22/15 0234  GLUCAP 146*    Recent Results (from the past 240 hour(s))  MRSA PCR Screening     Status: None   Collection Time: 08/22/15  2:26 AM  Result Value Ref Range Status   MRSA by PCR NEGATIVE NEGATIVE Final    Comment:        The GeneXpert MRSA Assay (FDA approved for NASAL specimens only), is one component of a comprehensive MRSA  colonization surveillance program. It is not intended to diagnose MRSA infection nor to guide or monitor treatment for MRSA infections.      Studies: No results found.  Scheduled Meds: . arformoterol  15 mcg Nebulization BID  . budesonide (PULMICORT) nebulizer solution  0.5 mg Nebulization BID  . chlordiazePOXIDE  10 mg Oral TID  . enoxaparin (LOVENOX) injection  40 mg Subcutaneous Daily  . FLUoxetine  40 mg Oral QHS  . fluticasone  2 spray Each Nare Daily  . methylPREDNISolone (SOLU-MEDROL) injection  80 mg Intravenous Q8H  . pantoprazole  40 mg Oral Q1200  . pregabalin  75 mg Oral BID  . QUEtiapine  100 mg Oral BID    Continuous Infusions:    Time spent: 25 minutes   Hollice Espy  [facility] Pager [phone] . If 7PM-7AM, please contact night-coverage at [email or web address], password [facility] 08/26/2015, 5:22 PM  LOS: 4 days

## 2015-08-26 NOTE — Telephone Encounter (Signed)
Call placed to [facility], RN CM to inquire about the current plan for discharge as the patient has [facility].  She stated that at this time, they are exploring the option of discharge to SNF.  Will continue to follow her hospital course and assist with scheduling a hospital follow up appointment if needed.

## 2015-08-26 NOTE — Clinical Social Work Psych Note (Signed)
Psych CSW received referral for anixety/SA.  Psychiatry evaluated on 08/25/2015 and provided medication adjustments.    Disposition: SNF- unit CSW has initiated this process and will follow patient for placement needs  Psych CSW remains available for assistance.  Vickii PennaGina Notnamed Croucher, LCSW [phone]  5N1-9; 2S 15-16 and Hospital Psychiatric Service Line Licensed Clinical Social Worker

## 2015-08-26 NOTE — Consult Note (Signed)
   [facility]   08/26/2015  Adele Schildermanda Lienemann 06/25/1976 [id number]   Came to visit patient to discuss [facility] follow up post hospital discharge. Spoke with inpatient Licensed CSW prior to visit, patient is supposed to go to SNF at discharge. Explained [facility] Care [facility] services. Patient states she plans on going to SNF but is not sure which one. Discussed that she will have [facility] Licensed [facility] follow up with her at SNF to ensure smooth transition back to home. [facility] MD. Explained to patient that [facility] will not interfere or replace services provided by SNF or home health services. Written consent obtained. Patient has had x 5 admits in past 6 months. Will request patient to be assigned once SNF facility is known. Will continue to closely follow. Inpatient RNCM and inpatient Licensed CSW aware that patient will [facility].  Raiford NobleAtika Hank Walling, MSN-Ed, RN,BSN [facility] Care Management Hospital Liaison [phone]

## 2015-08-27 LAB — CBC
HEMATOCRIT: 32.1 % — AB (ref 36.0–46.0)
HEMOGLOBIN: 9.9 g/dL — AB (ref 12.0–15.0)
MCH: 23.7 pg — ABNORMAL LOW (ref 26.0–34.0)
MCHC: 30.8 g/dL (ref 30.0–36.0)
MCV: 76.8 fL — ABNORMAL LOW (ref 78.0–100.0)
Platelets: 408 10*3/uL — ABNORMAL HIGH (ref 150–400)
RBC: 4.18 MIL/uL (ref 3.87–5.11)
RDW: 17.7 % — ABNORMAL HIGH (ref 11.5–15.5)
WBC: 24.3 10*3/uL — AB (ref 4.0–10.5)

## 2015-08-27 LAB — PROCALCITONIN: Procalcitonin: 0.26 ng/mL

## 2015-08-27 MED ORDER — PREDNISONE 50 MG PO TABS
50.0000 mg | ORAL_TABLET | Freq: Two times a day (BID) | ORAL | Status: DC
  Administered 2015-08-27 – 2015-08-28 (×2): 50 mg via ORAL
  Filled 2015-08-27 (×2): qty 1

## 2015-08-27 MED ORDER — CHLORDIAZEPOXIDE HCL 5 MG PO CAPS
10.0000 mg | ORAL_CAPSULE | Freq: Three times a day (TID) | ORAL | Status: DC
  Administered 2015-08-28: 10 mg via ORAL
  Filled 2015-08-27: qty 2

## 2015-08-27 MED ORDER — OXYCODONE HCL ER 10 MG PO T12A
10.0000 mg | EXTENDED_RELEASE_TABLET | Freq: Two times a day (BID) | ORAL | Status: DC
  Administered 2015-08-27 – 2015-09-01 (×11): 10 mg via ORAL
  Filled 2015-08-27 (×11): qty 1

## 2015-08-27 MED ORDER — ALPRAZOLAM 0.5 MG PO TABS
0.5000 mg | ORAL_TABLET | Freq: Once | ORAL | Status: AC
Start: 2015-08-27 — End: 2015-08-27
  Administered 2015-08-27: 0.5 mg via ORAL
  Filled 2015-08-27: qty 1

## 2015-08-27 NOTE — Progress Notes (Signed)
Summary of earlier event: RN, Thayer Ohmhris, paged this NP because rapid response had been called due to pt having some respiratory distress and ? Stridor. O2 sat remained 100% during event and RT was at bedside giving a neb tx.  Ordered a dose of Solumedrol and NP to bedside.  Upon arrival, pt is coughing during neb with increased respiratory rate but no use of accessory muscles. No stridor noted. No expiratory wheezing. No wheezing on ascultation. + congestion. O2 sat 100%. BP 140s. HR 1 teens.  Lost IV access suddently and before solumedrol could be given, pt was better and in no respiratory distress after neb tx. Coughing spell subsided and pt able to speak in sentences.  RN reported that pt had been upset earlier because her Xanax was discontinued by psych. Pt is here with substance abuse and psych is following. She has hx of asthma as well.  Plan: pt much improved. Cancel extra dose of Solumedrol. Had CXR a few hours ago so r/p not necessary at this time. Continue supportive care with nebs. Thought about possibility of acute pulmonary edema, but BP stable and pro-BNP was normal today. Since pt improved without further intervention, will follow. Jimmye NormanKaren Kirby-Graham, NP [facility]

## 2015-08-27 NOTE — Progress Notes (Signed)
PROGRESS NOTE  Julie Kerr DOB: 09/05/1975 DOA: 08/21/2015 PCP: Jaclyn ShaggyEnobong, Amao, MD  HPI/Recap of past 4724 hours: A 40-year-old female with past oral history of chronic pain, polysubstance abuse and vocal cord dysfunction admitted on 1/6 with acute restaurateur failure and was intubated and then self extubated the following day. Stabilized and transferred to hospitalist service by 1/9. Seen by psychiatry with medication adjustments. Patient evaluated and recommended for skilled nursing facility, of which but offers are pending.  After arrival to floor, patient continues to get extremely anxious and starts to hyperventilate. She exhibited significant wheezing percent on IV steroids. An ABG only noted minimal hypoxia, likely in the setting of asthma requiring 2 L nasal cannula. Chest x-ray and BNP were normal and oxygen saturations on 2 L of been at near 100%. Her wheezing is felt to be more from vocal cord dysfunction and anxiety at this point.  Assessment/Plan: Principal Problem:   Polysubstance abuse: Counseled. Active Problems:   Chronic pain syndrome: On chronic pain medication, will restart oral extended release pain medication    Generalized anxiety disorder: Seen by psychiatry, Xanax changed to Librium 3 times a day. Continue Seroquel and Prozac    Acute Respiratory failure (HCC) with hypoxia with underlying asthma and vocal cord dysfunction: Some of this may be tachypnea from anxiety. Will start IV Solu-Medrol and quickly taper. Chest x-ray notes minimal volume overload. Recent echo normal. However weight up 10 pounds from admission. BNP normal.    Obesity: Patient meets criteria with BMI greater than 30  Code Status: Full code  Family Communication: Left message with sister   Disposition Plan: Skilled nursing, anticipate possibly tomorrow   Consultants:  Psychiatry  Critical care   Procedures:  Mechanical ventilation 1/6-1/7   Antibiotics:  None     Objective: BP 108/55 mmHg  Pulse 103  Temp(Src) 98.8 F (37.1 C) (Oral)  Resp 17  Ht 5\' 3"  (1.6 m)  Wt 92.748 kg (204 lb 7.6 oz)  BMI 36.23 kg/m2  SpO2 99%  LMP 07/17/2015  Intake/Output Summary (Last 24 hours) at 08/27/15 1633 Last data filed at 08/27/15 1454  Gross per 24 hour  Intake   1200 ml  Output   1050 ml  Net    150 ml   Filed Weights   08/24/15 0341 08/24/15 2131 08/25/15 2304  Weight: 90.2 kg (198 lb 13.7 oz) 92.7 kg (204 lb 5.9 oz) 92.748 kg (204 lb 7.6 oz)    Exam:   General:  Alert and oriented 3, moderate distress secondary to anxiousness  Cardiovascular: Regular rate and rhythm, S1-S2, borderline tachycardia   Respiratory: Bilateral expiratory wheezing , although improved from previous day  Abdomen: Soft, nondistended, subjective diffuse tenderness, hypoactive bowel sounds   Musculoskeletal: No clubbing or cyanosis or edema    Data Reviewed: Basic Metabolic Panel:  Recent Labs Lab 08/21/15 1805 08/22/15 0401 08/23/15 0543 08/23/15 0815  NA 143 143 135 137  K 4.3 4.1 5.7* 3.9  CL 108 109 105 103  CO2 24 19* 24 26  GLUCOSE 105* 167* 112* 102*  BUN 17 11 12 10   CREATININE 1.27* 1.17* 0.75 0.76  CALCIUM 9.9 9.5 8.8* 9.0  MG  --  2.3  --   --   PHOS  --  3.4  --   --    Liver Function Tests: No results for input(s): AST, ALT, ALKPHOS, BILITOT, PROT, ALBUMIN in the last 168 hours. No results for input(s): LIPASE, AMYLASE in the last 168  hours. No results for input(s): AMMONIA in the last 168 hours. CBC:  Recent Labs Lab 08/21/15 1805 08/22/15 0401 08/27/15 0620  WBC 11.2* 15.9* 24.3*  NEUTROABS 7.2  --   --   HGB 12.2 11.0* 9.9*  HCT 38.1 36.9 32.1*  MCV 75.7* 79.0 76.8*  PLT 463* 344 408*   Cardiac Enzymes:   No results for input(s): CKTOTAL, CKMB, CKMBINDEX, TROPONINI in the last 168 hours. BNP (last 3 results)  Recent Labs  08/26/15 2025  BNP 7.4    ProBNP (last 3 results) No results for input(s): PROBNP in the  last 8760 hours.  CBG:  Recent Labs Lab 08/22/15 0234  GLUCAP 146*    Recent Results (from the past 240 hour(s))  MRSA PCR Screening     Status: None   Collection Time: 08/22/15  2:26 AM  Result Value Ref Range Status   MRSA by PCR NEGATIVE NEGATIVE Final    Comment:        The GeneXpert MRSA Assay (FDA approved for NASAL specimens only), is one component of a comprehensive MRSA colonization surveillance program. It is not intended to diagnose MRSA infection nor to guide or monitor treatment for MRSA infections.      Studies: Dg Chest 2 View  08/26/2015  CLINICAL DATA:  Acute respiratory failure. Chest pain and shortness of breath. Cough and fever. EXAM: CHEST  2 VIEW COMPARISON:  Most recent chest radiograph 08/21/2015 FINDINGS: Cardiomediastinal contours are unchanged allowing for differences in technique. No pulmonary edema, question mild central vascular congestion persists. Mild perihilar atelectasis without consolidation to suggest pneumonia. No pleural effusion or pneumothorax. Osseous structures are unchanged. IMPRESSION: Mild perihilar atelectasis. Question of mild central vascular congestion, similar to prior. Electronically Signed   By: Rubye Oaks M.D.   On: 08/26/2015 18:32    Scheduled Meds: . arformoterol  15 mcg Nebulization BID  . budesonide (PULMICORT) nebulizer solution  0.5 mg Nebulization BID  . chlordiazePOXIDE  10 mg Oral TID  . enoxaparin (LOVENOX) injection  40 mg Subcutaneous Daily  . FLUoxetine  40 mg Oral QHS  . fluticasone  2 spray Each Nare Daily  . oxyCODONE  10 mg Oral Q12H  . pantoprazole  40 mg Oral Q1200  . predniSONE  50 mg Oral BID WC  . pregabalin  75 mg Oral BID  . QUEtiapine  100 mg Oral BID    Continuous Infusions:    Time spent: 25 minutes   Hollice Espy  [facility] Pager [phone] . If 7PM-7AM, please contact night-coverage at [email or web address], password [facility] 08/27/2015, 4:33 PM  LOS: 5 days

## 2015-08-27 NOTE — Progress Notes (Signed)
Physical Therapy Treatment Patient Details Name: Julie Kerr MRN: [id number] DOB: 03/24/1976 Today's Date: 08/27/2015    History of Present Illness 60F w/ history of vocal cord dysfunction with multiple prior intubations and prior tracheotomy who presented with complaints of increased work of breathing, wheezing and cough. Attempts were made to palliate her symptoms with benzos and biPAP, but her respiratory distress persisted and she was intubated.    PT Comments    Pt willing to participate in PT this afternoon yet put forth little effort in transfers and began wheezing with increased RR after little activity. O2 remained at 98-100% throughout on 2L O2. Pt c/o shooting pain in back and down R leg with decreased sensation. Pt unable to stand w/o A x 2 and remains unable to take forward steps. Continued to recommend SNF once D/C'd.    Follow Up Recommendations  SNF     Equipment Recommendations  None recommended by PT    Recommendations for Other Services       Precautions / Restrictions Precautions Precautions: Fall Restrictions Weight Bearing Restrictions: No    Mobility  Bed Mobility Overal bed mobility: Needs Assistance Bed Mobility: Sit to Supine;Rolling Rolling: Modified independent (Device/Increase time)     Sit to supine: Min assist   General bed mobility comments: Requiring min assistance w/ LE management to return to supine although pt able to complete ~75% off transfer.  Transfers Overall transfer level: Needs assistance Equipment used: Rolling walker (2 wheeled) Transfers: Sit to/from [employer]Stand;Stand Pivot Transfers Sit to Stand: Mod assist;+2 physical assistance Stand pivot transfers: Max assist;+2 physical assistance       General transfer comment: Requring Mod-Max A x 2 for all transfers, c/o pain and labored breathing although O2 stayed at 99% throughout. Max VC's to achieve ~70% fully upright posture with RW  Ambulation/Gait                  Stairs            Wheelchair Mobility    Modified Rankin (Stroke Patients Only)       Balance   Sitting-balance support: Feet unsupported;Bilateral upper extremity supported       Standing balance support: Bilateral upper extremity supported                        Cognition Arousal/Alertness: Awake/alert Behavior During Therapy: Flat affect Overall Cognitive Status: History of cognitive impairments - at baseline (anxiety and psychiartic health conditions)                      Exercises Total Joint Exercises Long Arc Quad: AROM;Seated;Both Other Exercises Other Exercises: Repeated Sit to Stands    General Comments        Pertinent Vitals/Pain Pain Assessment: 0-10 Pain Score: 8  Pain Location: back and R leg Pain Intervention(s): Premedicated before session;Limited activity within patient's tolerance    Home Living                      Prior Function            PT Goals (current goals can now be found in the care plan section) Acute Rehab PT Goals Patient Stated Goal: get stronger    Frequency  Min 3X/week    PT Plan      Co-evaluation             End of Session Equipment Utilized During Treatment: Gait belt  Activity Tolerance: Patient limited by pain;Patient limited by fatigue Patient left: in bed;with call bell/phone within reach     Time: 1358-1420 PT Time Calculation (min) (ACUTE ONLY): 22 min  Charges:  $Therapeutic Activity: 8-22 mins                    G Codes:      Berton Mount September 08, 2015, 5:10 PM  Ulyses Jarred, Student Physical Therapist Acute Rehab [address] [place], [place] [phone]

## 2015-08-27 NOTE — Clinical Social Work Note (Signed)
Talked with patient regarding discharge on Friday, 1/13, per MD. Patient advised that only facility response [facility]. Patient understands and is agreeable.   Genelle BalVanessa Harbor Vanover, MSW, LCSW Licensed Clinical Social Worker Clinical Social Work Department [facility] [phone]

## 2015-08-28 ENCOUNTER — Other Ambulatory Visit: Payer: Self-pay | Admitting: *Deleted

## 2015-08-28 LAB — BASIC METABOLIC PANEL
ANION GAP: 10 (ref 5–15)
BUN: 10 mg/dL (ref 6–20)
CHLORIDE: 102 mmol/L (ref 101–111)
CO2: 25 mmol/L (ref 22–32)
Calcium: 9.6 mg/dL (ref 8.9–10.3)
Creatinine, Ser: 0.82 mg/dL (ref 0.44–1.00)
GFR calc Af Amer: >60 mL/min (ref >60)
GLUCOSE: 205 mg/dL — AB (ref 65–99)
POTASSIUM: 4.3 mmol/L (ref 3.5–5.1)
Sodium: 137 mmol/L (ref 135–145)

## 2015-08-28 LAB — URINALYSIS, ROUTINE W REFLEX MICROSCOPIC
BILIRUBIN URINE: NEGATIVE
Hgb urine dipstick: NEGATIVE
Ketones, ur: 15 mg/dL — AB
Leukocytes, UA: NEGATIVE
NITRITE: NEGATIVE
PH: 6.5 (ref 5.0–8.0)
Protein, ur: NEGATIVE mg/dL
SPECIFIC GRAVITY, URINE: 1.019 (ref 1.005–1.030)

## 2015-08-28 LAB — URINE MICROSCOPIC-ADD ON: RBC / HPF: NONE SEEN RBC/hpf (ref 0–5)

## 2015-08-28 LAB — CBC
HEMATOCRIT: 30.6 % — AB (ref 36.0–46.0)
HEMOGLOBIN: 9.6 g/dL — AB (ref 12.0–15.0)
MCH: 24.3 pg — ABNORMAL LOW (ref 26.0–34.0)
MCHC: 31.4 g/dL (ref 30.0–36.0)
MCV: 77.5 fL — AB (ref 78.0–100.0)
Platelets: 372 10*3/uL (ref 150–400)
RBC: 3.95 MIL/uL (ref 3.87–5.11)
RDW: 17.8 % — ABNORMAL HIGH (ref 11.5–15.5)
WBC: 27.5 10*3/uL — ABNORMAL HIGH (ref 4.0–10.5)

## 2015-08-28 MED ORDER — PREDNISONE 20 MG PO TABS
40.0000 mg | ORAL_TABLET | Freq: Every day | ORAL | Status: DC
  Administered 2015-08-29 – 2015-08-30 (×2): 40 mg via ORAL
  Filled 2015-08-28 (×2): qty 2

## 2015-08-28 MED ORDER — LEVOFLOXACIN 500 MG PO TABS
500.0000 mg | ORAL_TABLET | Freq: Every day | ORAL | Status: DC
  Administered 2015-08-28 – 2015-09-01 (×5): 500 mg via ORAL
  Filled 2015-08-28 (×5): qty 1

## 2015-08-28 MED ORDER — ALPRAZOLAM 0.5 MG PO TABS
0.5000 mg | ORAL_TABLET | Freq: Three times a day (TID) | ORAL | Status: DC
  Administered 2015-08-28 – 2015-09-01 (×13): 0.5 mg via ORAL
  Filled 2015-08-28 (×13): qty 1

## 2015-08-28 NOTE — Clinical Social Work Placement (Addendum)
   CLINICAL SOCIAL WORK PLACEMENT  NOTE **PASRR NUMBER PENDING **SEE COMMENTS SECTION BELOW  Date:  08/28/2015  Patient Details  Name: Julie Kerr MRN: [id number] Date of Birth: 11/28/1975  Clinical Social Work is seeking post-discharge placement for this patient at the Skilled  Nursing Facility level of care (*CSW will initial, date and re-position this form in  chart as items are completed):  Yes   Patient/family provided with [place] Clinical Social Work Department's list of facilities offering this level of care within the geographic area requested by the patient (or if unable, by the patient's family).  Yes   Patient/family informed of their freedom to choose among providers that offer the needed level of care, that participate in Medicare, Medicaid or managed care program needed by the patient, have an available bed and are willing to accept the patient.  Yes   Patient/family informed of [place]'s ownership interest in [facility] and [facility], as well as of the fact that they are under no obligation to receive care at these facilities.  PASRR submitted to EDS on 08/25/11. Submitted 30-day note and clinicals to [place] MUST on 1/13.     PASRR number received on       Existing PASRR number confirmed on       FL2 transmitted to all facilities in geographic area requested by pt/family on 08/25/15     FL2 transmitted to all facilities within larger geographic area on       Patient informed that his/her managed care company has contracts with or will negotiate with certain facilities, including the following:        Yes   Patient/family informed of bed offers received.  [facility]     Physician recommends and patient chooses bed at      Patient to be transferred to   on  .  Patient to be transferred to facility by       Patient family notified on   of transfer.  Name of family member notified:        PHYSICIAN       Additional  Comment:  08/28/15 - Call made to [facility] after 7 pm and she was made aware of needing PASRR number prior to patient discharging.  CSW also contacted attending and updated him regarding pending PASRR number.   _______________________________________________ Cristobal Goldmannrawford, Garnetta Fedrick Bradley, LCSW 08/28/2015, 6:40 PM

## 2015-08-28 NOTE — Clinical Social Work Note (Signed)
Clinical Social Work Assessment  Patient Details  Name: Julie Kerr MRN: [id number] Date of Birth: 02/09/1976  Date of referral:  08/24/15               Reason for consult:  Facility Placement                Permission sought to share information with:  Facility Industrial/product designerContact Representative Permission granted to share information::  Yes, Verbal Permission Granted  Name::     Bjorn LoserKathy McClure  Agency::     Relationship::  Sister  Contact Information:  [phone]  Housing/Transportation Living arrangements for the past 2 months:  Single Family Home Source of Information:  Patient Patient Interpreter Needed:  None Criminal Activity/Legal Involvement Pertinent to Current Situation/Hospitalization:  No - Comment as needed Significant Relationships:  Siblings Lives with:    Do you feel safe going back to the place where you live?  Yes Need for family participation in patient care:  No (Coment)  Care giving concerns:  None expressed by patient.   Social Worker assessment / plan:  CSW initially talked with patient on 1/10 regarding recommendation of ST rehab and initially patient refused, although she was not able to tell CSW who would be able to assist her at home. Patient eventually agreeable to rehab and is open to [facility] (her only bed offer). Patient reported that she does have a sister who assists her.  Employment status:  Disabled (Comment on whether or not currently receiving Disability) Insurance information:  [place], [employer] PT Recommendations:  Skilled Nursing Facility Information / Referral to community resources:  Skilled Nursing Facility  Patient/Family's Response to care:  Patient did not express any concerns regarding care at the hospital.   Patient/Family's Understanding of and Emotional Response to Diagnosis, Current Treatment, and Prognosis:  Not discussed.  Emotional Assessment Appearance:  Appears stated age Attitude/Demeanor/Rapport:  Other  (Appropriate) Affect (typically observed):  Appropriate Orientation:  Oriented to Self, Oriented to Place, Oriented to  Time, Oriented to Situation Alcohol / Substance use:  Tobacco Use (Patient reported that she quit smoking and that drinks alcohol and does not use illicit drugs.) Psych involvement (Current and /or in the community):  No (Comment)  Discharge Needs  Concerns to be addressed:  Discharge Planning Concerns Readmission within the last 30 days:  Yes Current discharge risk:  None Barriers to Discharge:  Awaiting State Approval ([place]) (30-day note submitted after 5 pm on 1/13.)   Cristobal Goldmannrawford, Darrious Youman Bradley, LCSW 08/28/2015, 6:19 PM

## 2015-08-28 NOTE — Care Management Important Message (Signed)
Important Message  Patient Details  Name: Julie Kerr MRN: [id number] Date of Birth: 02/23/1976   Medicare Important Message Given:  Yes    Lendell Gallick P Maily Debarge 08/28/2015, 3:20 PM

## 2015-08-28 NOTE — Progress Notes (Signed)
PROGRESS NOTE  Julie Kerr ZOX:[id number] DOB: 12-27-1975 DOA: 08/21/2015 PCP: Jaclyn Shaggy, MD  HPI/Recap of past 43 hours: A 40-year-old female with past oral history of chronic pain, polysubstance abuse and vocal cord dysfunction admitted on 1/6 with acute restaurateur failure and was intubated and then self extubated the following day. Stabilized and transferred to hospitalist service by 1/9. Seen by psychiatry with medication adjustments. Patient evaluated and recommended for skilled nursing facility, of which but offers are pending.  After arrival to floor, patient continued to get extremely anxious and starts to hyperventilate. She exhibited significant wheezing percent on IV steroids. An ABG only noted minimal hypoxia, likely in the setting of asthma requiring 2 L nasal cannula. Chest x-ray and BNP were normal and oxygen saturations on 2 L of been at near 100%. Her wheezing is felt to be more from vocal cord dysfunction and anxiety at this point.  Patient this morning overall doing better. White count last night at 24, felt to possibly be in part from steroids. However white count despite changing from Solu-Medrol to prednisone increased to 27 this morning. Pro calcitoninat 0.26 on 1/12. By mouth Levaquin started. Urinalysis ordered and is still pending. Patient is self with overall no complaints. Feels less anxious.  Assessment/Plan: Principal Problem:   Polysubstance abuse: Counseled. Active Problems:   Chronic pain syndrome: On chronic pain medication, will restart oral extended release pain medication    Generalized anxiety disorder: Seen by psychiatry, Xanax changed to Librium 3 times a day. In by changing back, patient is more compliant and more calm Continue Seroquel and Prozac    Acute Respiratory failure (HCC) with hypoxia with underlying asthma and vocal cord dysfunction: Some of this may be tachypnea from anxiety. Steroids started and quickly tapering down Chest x-ray notes  minimal volume overload. Recent echo normal. However weight up 10 pounds from admission. BNP normal. Given signs of underlying possible infection, have started Levaquin.    Obesity: Patient meets criteria with BMI greater than 30  Code Status: Full code  Family Communication: Left message with sister   Disposition Plan: Skilled nursing, although patient is more insistent on going home. Possible discharge this weekend   Consultants:  Psychiatry  Critical care   Procedures:  Mechanical ventilation 1/6-1/7   Antibiotics:  Levaquin 1/13-present   Objective: BP 114/62 mmHg  Pulse 88  Temp(Src) 98 F (36.7 C) (Oral)  Resp 20  Ht 5\' 3"  (1.6 m)  Wt 90.3 kg (199 lb 1.2 oz)  BMI 35.27 kg/m2  SpO2 100%  LMP 07/17/2015  Intake/Output Summary (Last 24 hours) at 08/28/15 1535 Last data filed at 08/28/15 0905  Gross per 24 hour  Intake   1640 ml  Output    900 ml  Net    740 ml   Filed Weights   08/24/15 2131 08/25/15 2304 08/27/15 2205  Weight: 92.7 kg (204 lb 5.9 oz) 92.748 kg (204 lb 7.6 oz) 90.3 kg (199 lb 1.2 oz)    Exam:   General:  Alert and oriented 3, less anxious  Cardiovascular: Regular rate and rhythm, S1-S2,  Respiratory: Clear to auscultation bilaterally, upper airway pseudo-wheeze  Abdomen: Soft, nondistended, subjective diffuse tenderness, hypoactive bowel sounds   Musculoskeletal: No clubbing or cyanosis or edema    Data Reviewed: Basic Metabolic Panel:  Recent Labs Lab 08/21/15 1805 08/22/15 0401 08/23/15 0543 08/23/15 0815 08/28/15 0935  NA 143 143 135 137 137  K 4.3 4.1 5.7* 3.9 4.3  CL 108 109 105  103 102  CO2 24 19* 24 26 25   GLUCOSE 105* 167* 112* 102* 205*  BUN 17 11 12 10 10   CREATININE 1.27* 1.17* 0.75 0.76 0.82  CALCIUM 9.9 9.5 8.8* 9.0 9.6  MG  --  2.3  --   --   --   PHOS  --  3.4  --   --   --    Liver Function Tests: No results for input(s): AST, ALT, ALKPHOS, BILITOT, PROT, ALBUMIN in the last 168 hours. No  results for input(s): LIPASE, AMYLASE in the last 168 hours. No results for input(s): AMMONIA in the last 168 hours. CBC:  Recent Labs Lab 08/21/15 1805 08/22/15 0401 08/27/15 0620 08/28/15 0935  WBC 11.2* 15.9* 24.3* 27.5*  NEUTROABS 7.2  --   --   --   HGB 12.2 11.0* 9.9* 9.6*  HCT 38.1 36.9 32.1* 30.6*  MCV 75.7* 79.0 76.8* 77.5*  PLT 463* 344 408* 372   Cardiac Enzymes:   No results for input(s): CKTOTAL, CKMB, CKMBINDEX, TROPONINI in the last 168 hours. BNP (last 3 results)  Recent Labs  08/26/15 2025  BNP 7.4    ProBNP (last 3 results) No results for input(s): PROBNP in the last 8760 hours.  CBG:  Recent Labs Lab 08/22/15 0234  GLUCAP 146*    Recent Results (from the past 240 hour(s))  MRSA PCR Screening     Status: None   Collection Time: 08/22/15  2:26 AM  Result Value Ref Range Status   MRSA by PCR NEGATIVE NEGATIVE Final    Comment:        The GeneXpert MRSA Assay (FDA approved for NASAL specimens only), is one component of a comprehensive MRSA colonization surveillance program. It is not intended to diagnose MRSA infection nor to guide or monitor treatment for MRSA infections.      Studies: No results found.  Scheduled Meds: . ALPRAZolam  0.5 mg Oral TID  . arformoterol  15 mcg Nebulization BID  . budesonide (PULMICORT) nebulizer solution  0.5 mg Nebulization BID  . enoxaparin (LOVENOX) injection  40 mg Subcutaneous Daily  . FLUoxetine  40 mg Oral QHS  . fluticasone  2 spray Each Nare Daily  . levofloxacin  500 mg Oral Daily  . oxyCODONE  10 mg Oral Q12H  . pantoprazole  40 mg Oral Q1200  . [START ON 08/29/2015] predniSONE  40 mg Oral Q breakfast  . pregabalin  75 mg Oral BID  . QUEtiapine  100 mg Oral BID    Continuous Infusions:    Time spent: 25 minutes   Hollice EspyKRISHNAN,Tawn Fitzner K  [facility] Pager [phone] . If 7PM-7AM, please contact night-coverage at [email or web address], password [email or web address] 08/28/2015, 3:35 PM  LOS: 6 days
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
## 2015-08-29 LAB — CBC
HEMATOCRIT: 30.4 % — AB (ref 36.0–46.0)
Hemoglobin: 9.5 g/dL — ABNORMAL LOW (ref 12.0–15.0)
MCH: 24.7 pg — AB (ref 26.0–34.0)
MCHC: 31.3 g/dL (ref 30.0–36.0)
MCV: 79 fL (ref 78.0–100.0)
Platelets: 371 10*3/uL (ref 150–400)
RBC: 3.85 MIL/uL — ABNORMAL LOW (ref 3.87–5.11)
RDW: 18.1 % — AB (ref 11.5–15.5)
WBC: 24.4 10*3/uL — ABNORMAL HIGH (ref 4.0–10.5)

## 2015-08-29 LAB — CREATININE, SERUM
CREATININE: 0.9 mg/dL (ref 0.44–1.00)
GFR calc Af Amer: >60 mL/min (ref >60)
GFR calc non Af Amer: >60 mL/min (ref >60)

## 2015-08-29 LAB — HIV ANTIBODY (ROUTINE TESTING W REFLEX): HIV SCREEN 4TH GENERATION: NONREACTIVE

## 2015-08-29 LAB — PROCALCITONIN

## 2015-08-29 LAB — RPR: RPR: NONREACTIVE

## 2015-08-29 MED ORDER — NALOXEGOL OXALATE 25 MG PO TABS
25.0000 mg | ORAL_TABLET | Freq: Every day | ORAL | Status: DC
  Administered 2015-08-29 – 2015-09-01 (×4): 25 mg via ORAL
  Filled 2015-08-29 (×4): qty 1

## 2015-08-29 MED ORDER — LACTULOSE 10 GM/15ML PO SOLN
20.0000 g | Freq: Two times a day (BID) | ORAL | Status: DC | PRN
  Administered 2015-08-29 – 2015-09-01 (×4): 20 g via ORAL
  Filled 2015-08-29 (×4): qty 30

## 2015-08-29 NOTE — Progress Notes (Signed)
PROGRESS NOTE  Julie Kerr GNF:[id number] DOB: 07/24/1976 DOA: 08/21/2015 PCP: Jaclyn ShaggyEnobong, Amao, MD  HPI A 40 year old female with past oral history of chronic pain, polysubstance abuse and vocal cord dysfunction admitted on 1/6 with acute restaurateur failure and was intubated and then self extubated the following day. Stabilized and transferred to hospitalist service by 1/9. Seen by psychiatry with medication adjustments. Patient evaluated and recommended for skilled nursing facility, of which but offers are pending.  After arrival to floor, patient continued to get extremely anxious and starts to hyperventilate. She exhibited significant wheezing percent on IV steroids. An ABG only noted minimal hypoxia, likely in the setting of asthma requiring 2 L nasal cannula. Chest x-ray and BNP were normal and oxygen saturations on 2 L of been at near 100%. Her wheezing is felt to be more from vocal cord dysfunction and anxiety at this point.  Patient this morning overall doing better. White count last night at 24, felt to possibly be in part from steroids. However white count despite changing from Solu-Medrol to prednisone increased to 27 this morning. Pro calcitoninat 0.26 on 1/12. By mouth Levaquin started. Patient is self with overall no complaints. Feels less anxious.  Assessment/Plan:    Polysubstance abuse: Counseled.    Chronic pain syndrome: On chronic pain medication, will restart oral extended release pain medication    Generalized anxiety disorder: Seen by psychiatry, Xanax changed to Librium 3 times a day. In by changing back, patient is more compliant and more calm Continue Seroquel and Prozac    Acute Respiratory failure (HCC) with hypoxia with underlying asthma and vocal cord dysfunction:  -not on O2 -levaquin started -nebs PRN  Leukocytosis -WBCs trending down -due to steroids vs infection (levaquin started)    Obesity: Patient meets criteria with BMI greater than 30  Code  Status: Full code  Family Communication: no family at bedside  Disposition Plan: SNF   Consultants:  Psychiatry  Critical care   Procedures:  Mechanical ventilation 1/6-1/7   Antibiotics:  Levaquin 1/13-present   Objective: BP 111/63 mmHg  Pulse 87  Temp(Src) 98.7 F (37.1 C) (Oral)  Resp 18  Ht 5\' 3"  (1.6 m)  Wt 90.3 kg (199 lb 1.2 oz)  BMI 35.27 kg/m2  SpO2 100%  LMP 07/17/2015  Intake/Output Summary (Last 24 hours) at 08/29/15 0809 Last data filed at 08/29/15 0537  Gross per 24 hour  Intake   1560 ml  Output    700 ml  Net    860 ml   Filed Weights   08/24/15 2131 08/25/15 2304 08/27/15 2205  Weight: 92.7 kg (204 lb 5.9 oz) 92.748 kg (204 lb 7.6 oz) 90.3 kg (199 lb 1.2 oz)    Exam:   General:  Alert and oriented 3, NAD- eating breakfast  Cardiovascular: Regular rate and rhythm, S1-S2,  Respiratory: Clear to auscultation bilaterally, upper airway pseudo-wheeze  Abdomen: Soft, nondistended, subjective diffuse tenderness, hypoactive bowel sounds   Musculoskeletal: No clubbing or cyanosis or edema    Data Reviewed: Basic Metabolic Panel:  Recent Labs Lab 08/23/15 0543 08/23/15 0815 08/28/15 0935 08/29/15 0449  NA 135 137 137  --   K 5.7* 3.9 4.3  --   CL 105 103 102  --   CO2 24 26 25   --   GLUCOSE 112* 102* 205*  --   BUN 12 10 10   --   CREATININE 0.75 0.76 0.82 0.90  CALCIUM 8.8* 9.0 9.6  --    Liver Function Tests:  No results for input(s): AST, ALT, ALKPHOS, BILITOT, PROT, ALBUMIN in the last 168 hours. No results for input(s): LIPASE, AMYLASE in the last 168 hours. No results for input(s): AMMONIA in the last 168 hours. CBC:  Recent Labs Lab 08/27/15 0620 08/28/15 0935 08/29/15 0449  WBC 24.3* 27.5* 24.4*  HGB 9.9* 9.6* 9.5*  HCT 32.1* 30.6* 30.4*  MCV 76.8* 77.5* 79.0  PLT 408* 372 371   Cardiac Enzymes:   No results for input(s): CKTOTAL, CKMB, CKMBINDEX, TROPONINI in the last 168 hours. BNP (last 3  results)  Recent Labs  08/26/15 2025  BNP 7.4    ProBNP (last 3 results) No results for input(s): PROBNP in the last 8760 hours.  CBG: No results for input(s): GLUCAP in the last 168 hours.  Recent Results (from the past 240 hour(s))  MRSA PCR Screening     Status: None   Collection Time: 08/22/15  2:26 AM  Result Value Ref Range Status   MRSA by PCR NEGATIVE NEGATIVE Final    Comment:        The GeneXpert MRSA Assay (FDA approved for NASAL specimens only), is one component of a comprehensive MRSA colonization surveillance program. It is not intended to diagnose MRSA infection nor to guide or monitor treatment for MRSA infections.      Studies: No results found.  Scheduled Meds: . ALPRAZolam  0.5 mg Oral TID  . arformoterol  15 mcg Nebulization BID  . budesonide (PULMICORT) nebulizer solution  0.5 mg Nebulization BID  . enoxaparin (LOVENOX) injection  40 mg Subcutaneous Daily  . FLUoxetine  40 mg Oral QHS  . fluticasone  2 spray Each Nare Daily  . levofloxacin  500 mg Oral Daily  . oxyCODONE  10 mg Oral Q12H  . pantoprazole  40 mg Oral Q1200  . predniSONE  40 mg Oral Q breakfast  . pregabalin  75 mg Oral BID  . QUEtiapine  100 mg Oral BID    Continuous Infusions:    Time spent: 25 minutes   JESSICA U [facility]  [facility] Pager [phone] . If 7PM-7AM, please contact night-coverage at [email or web address], password [facility] 08/29/2015, 8:09 AM  LOS: 7 days

## 2015-08-29 NOTE — Progress Notes (Signed)
08/29/15 2145 RN was called by [facility] to go in the patient's room asap.Upon entering room,patient was found on the floor,laying on her right buttock and  crying.Asked what happened,she states " I stood up from bedside commode to try to get  the call bell that was on the sink to call but I can't reach it and my knees buckled and I fell." Asked if she hit any part of her body, states"I hit my forehead at the edge of the sink and fell on my right buttock." Patient complains of pain on right buttock area where she fell. No bruising nor open area noted on forehead nor on right buttock. Three staff members got her up to a chair and back to the bed. Patient able to move all extremities but she complained of severe pain on her right buttock area. 22:20K. Kirby,NP notified of patient's fall,order received. 22:25 Patient's partner,Louisa notified of pt's fall.Patient's VSS.Will continue to monitor. Sheronica Corey, Drinda Buttsharito Joselita, RCharity fundraiser

## 2015-08-29 NOTE — Progress Notes (Signed)
CSW notified by Dr. Vann of medical stability today. Continue to await PASARR number- currBenjamine Molaently in "manual review".  Unable to place patient without PASARR number.  Dr. Benjamine MolaVann notified.  CSW will continue to watch for PASARR number just in case is is assigned and then will notify MD. [place] [facility] [place].   Lorri Frederickonna T. Jaci LazierCrowder, [place] [phone] (weekend coverage)

## 2015-08-30 ENCOUNTER — Inpatient Hospital Stay (HOSPITAL_COMMUNITY): Payer: Commercial Managed Care - HMO

## 2015-08-30 LAB — PREGNANCY, URINE: Preg Test, Ur: NEGATIVE

## 2015-08-30 MED ORDER — FLEET ENEMA 7-19 GM/118ML RE ENEM
1.0000 | ENEMA | Freq: Once | RECTAL | Status: AC
  Administered 2015-08-31: 1 via RECTAL
  Filled 2015-08-30: qty 1

## 2015-08-30 MED ORDER — PREDNISONE 20 MG PO TABS
30.0000 mg | ORAL_TABLET | Freq: Every day | ORAL | Status: DC
  Administered 2015-08-31 – 2015-09-01 (×2): 30 mg via ORAL
  Filled 2015-08-30 (×2): qty 1

## 2015-08-30 NOTE — Progress Notes (Signed)
PROGRESS NOTE  Julie Kerr YNW:[id number] DOB: 10/03/1975 DOA: 08/21/2015 PCP: Jaclyn ShaggyEnobong, Amao, MD  HPI A 40 year old female with past oral history of chronic pain, polysubstance abuse and vocal cord dysfunction admitted on 1/6 with acute restaurateur failure and was intubated and then self extubated the following day. Stabilized and transferred to hospitalist service by 1/9. Seen by psychiatry with medication adjustments. Patient evaluated and recommended for skilled nursing facility, of which but offers are pending.  After arrival to floor, patient continued to get extremely anxious and starts to hyperventilate. She exhibited significant wheezing percent on IV steroids. An ABG only noted minimal hypoxia, likely in the setting of asthma requiring 2 L nasal cannula. Chest x-ray and BNP were normal and oxygen saturations on 2 L of been at near 100%. Her wheezing is felt to be more from vocal cord dysfunction and anxiety at this point.  Patient this morning overall doing better. White count last night at 24, felt to possibly be in part from steroids. However white count despite changing from Solu-Medrol to prednisone increased to 27 this morning. Pro calcitoninat 0.26 on 1/12. By mouth Levaquin started. Patient is self with overall no complaints. Feels less anxious.  Assessment/Plan:    Polysubstance abuse: Counseled.    Chronic pain syndrome: continue current message    Generalized anxiety disorder: Seen by psychiatry, Xanax changed to Librium 3 times a day. In by changing back, patient is more compliant and more calm Continue Seroquel and Prozac    Acute Respiratory failure (HCC) with hypoxia with underlying asthma and vocal cord dysfunction:  -not on O2 -levaquin started -nebs PRN -wean steroids  Leukocytosis -WBCs trending down -due to steroids vs infection (levaquin started)    Obesity: Patient meets criteria with BMI greater than 30  Constipation -had BM yesterday   Code  Status: Full code  Family Communication: no family at bedside  Disposition Plan: SNF   Consultants:  Psychiatry  Critical care   Procedures:  Mechanical ventilation 1/6-1/7   Antibiotics:  Levaquin 1/13-present   Objective: BP 123/64 mmHg  Pulse 85  Temp(Src) 98.2 F (36.8 C) (Oral)  Resp 19  Ht 5\' 3"  (1.6 m)  Wt 90.3 kg (199 lb 1.2 oz)  BMI 35.27 kg/m2  SpO2 99%  LMP 07/17/2015  Intake/Output Summary (Last 24 hours) at 08/30/15 1440 Last data filed at 08/30/15 1401  Gross per 24 hour  Intake   1560 ml  Output    851 ml  Net    709 ml   Filed Weights   08/24/15 2131 08/25/15 2304 08/27/15 2205  Weight: 92.7 kg (204 lb 5.9 oz) 92.748 kg (204 lb 7.6 oz) 90.3 kg (199 lb 1.2 oz)    Exam:   General:  Alert and oriented 3, NAD- eating  Cardiovascular: Regular rate and rhythm, S1-S2,  Respiratory: Clear to auscultation bilaterally, upper airway pseudo-wheeze    Data Reviewed: Basic Metabolic Panel:  Recent Labs Lab 08/28/15 0935 08/29/15 0449  NA 137  --   K 4.3  --   CL 102  --   CO2 25  --   GLUCOSE 205*  --   BUN 10  --   CREATININE 0.82 0.90  CALCIUM 9.6  --    Liver Function Tests: No results for input(s): AST, ALT, ALKPHOS, BILITOT, PROT, ALBUMIN in the last 168 hours. No results for input(s): LIPASE, AMYLASE in the last 168 hours. No results for input(s): AMMONIA in the last 168 hours. CBC:  Recent Labs  Lab 08/27/15 0620 08/28/15 0935 08/29/15 0449  WBC 24.3* 27.5* 24.4*  HGB 9.9* 9.6* 9.5*  HCT 32.1* 30.6* 30.4*  MCV 76.8* 77.5* 79.0  PLT 408* 372 371   Cardiac Enzymes:   No results for input(s): CKTOTAL, CKMB, CKMBINDEX, TROPONINI in the last 168 hours. BNP (last 3 results)  Recent Labs  08/26/15 2025  BNP 7.4    ProBNP (last 3 results) No results for input(s): PROBNP in the last 8760 hours.  CBG: No results for input(s): GLUCAP in the last 168 hours.  Recent Results (from the past 240 hour(s))  MRSA PCR  Screening     Status: None   Collection Time: 08/22/15  2:26 AM  Result Value Ref Range Status   MRSA by PCR NEGATIVE NEGATIVE Final    Comment:        The GeneXpert MRSA Assay (FDA approved for NASAL specimens only), is one component of a comprehensive MRSA colonization surveillance program. It is not intended to diagnose MRSA infection nor to guide or monitor treatment for MRSA infections.      Studies: Dg Lumbar Spine 2-3 Views  08/30/2015  CLINICAL DATA:  Acute onset of lower back pain, status post fall in hospital room. Initial encounter. EXAM: LUMBAR SPINE - 2-3 VIEW COMPARISON:  Lumbar spine radiographs performed 04/28/2015 FINDINGS: There is no evidence of fracture or subluxation. Vertebral bodies demonstrate normal height and alignment. The patient is status post lumbar spinal fusion at L5-S1. Intervertebral disc spaces are preserved. The visualized bowel gas pattern is unremarkable in appearance; air and stool are noted within the colon. The sacroiliac joints are within normal limits. IMPRESSION: No evidence of fracture or subluxation along the lumbar spine. Status post lumbar spinal fusion at L5-S1. Electronically Signed   By: Roanna Raider M.D.   On: 08/30/2015 02:27    Scheduled Meds: . ALPRAZolam  0.5 mg Oral TID  . arformoterol  15 mcg Nebulization BID  . budesonide (PULMICORT) nebulizer solution  0.5 mg Nebulization BID  . enoxaparin (LOVENOX) injection  40 mg Subcutaneous Daily  . FLUoxetine  40 mg Oral QHS  . fluticasone  2 spray Each Nare Daily  . levofloxacin  500 mg Oral Daily  . naloxegol oxalate  25 mg Oral Daily  . oxyCODONE  10 mg Oral Q12H  . pantoprazole  40 mg Oral Q1200  . [START ON 08/31/2015] predniSONE  30 mg Oral Q breakfast  . pregabalin  75 mg Oral BID  . QUEtiapine  100 mg Oral BID    Continuous Infusions:    Time spent: 25 minutes   JESSICA U [facility]  [facility] Pager [phone] . If 7PM-7AM, please contact night-coverage at  [email or web address], password [facility] 08/30/2015, 2:40 PM  LOS: 8 days

## 2015-08-31 DIAGNOSIS — F411 Generalized anxiety disorder: Secondary | ICD-10-CM

## 2015-08-31 DIAGNOSIS — F1494 Cocaine use, unspecified with cocaine-induced mood disorder: Secondary | ICD-10-CM

## 2015-08-31 DIAGNOSIS — F1910 Other psychoactive substance abuse, uncomplicated: Secondary | ICD-10-CM

## 2015-08-31 LAB — BASIC METABOLIC PANEL
ANION GAP: 12 (ref 5–15)
BUN: 13 mg/dL (ref 6–20)
CALCIUM: 9.4 mg/dL (ref 8.9–10.3)
CO2: 26 mmol/L (ref 22–32)
Chloride: 99 mmol/L — ABNORMAL LOW (ref 101–111)
Creatinine, Ser: 0.85 mg/dL (ref 0.44–1.00)
Glucose, Bld: 142 mg/dL — ABNORMAL HIGH (ref 65–99)
POTASSIUM: 4.5 mmol/L (ref 3.5–5.1)
Sodium: 137 mmol/L (ref 135–145)

## 2015-08-31 LAB — CBC
HEMATOCRIT: 29.6 % — AB (ref 36.0–46.0)
HEMOGLOBIN: 9.3 g/dL — AB (ref 12.0–15.0)
MCH: 24.3 pg — ABNORMAL LOW (ref 26.0–34.0)
MCHC: 31.4 g/dL (ref 30.0–36.0)
MCV: 77.3 fL — ABNORMAL LOW (ref 78.0–100.0)
Platelets: 360 10*3/uL (ref 150–400)
RBC: 3.83 MIL/uL — AB (ref 3.87–5.11)
RDW: 18.1 % — ABNORMAL HIGH (ref 11.5–15.5)
WBC: 22.9 10*3/uL — AB (ref 4.0–10.5)

## 2015-08-31 LAB — PROCALCITONIN

## 2015-08-31 MED ORDER — POLYETHYLENE GLYCOL 3350 17 GM/SCOOP PO POWD
1.0000 | Freq: Once | ORAL | Status: AC
  Administered 2015-08-31: 255 g via ORAL
  Filled 2015-08-31: qty 255

## 2015-08-31 NOTE — Progress Notes (Signed)
PROGRESS NOTE  Julie Kerr ZOX:[id number] DOB: 12/22/1975 DOA: 08/21/2015 PCP: Jaclyn ShaggyEnobong, Amao, MD  HPI A 40 year old female with past oral history of chronic pain, polysubstance abuse and vocal cord dysfunction admitted on 1/6 with acute restaurateur failure and was intubated and then self extubated the following day. Stabilized and transferred to hospitalist service by 1/9. Seen by psychiatry with medication adjustments. Patient evaluated and recommended for skilled nursing facility, of which but offers are pending.  After arrival to floor, patient continued to get extremely anxious and starts to hyperventilate. She exhibited significant wheezing percent on IV steroids. An ABG only noted minimal hypoxia, likely in the setting of asthma requiring 2 L nasal cannula. Chest x-ray and BNP were normal and oxygen saturations on 2 L of been at near 100%. Her wheezing is felt to be more from vocal cord dysfunction and anxiety at this point.    Assessment/Plan:    Polysubstance abuse: Counseled.    Chronic pain syndrome: continue current message NO ESCALATION    Generalized anxiety disorder: Seen by psychiatry, Xanax changed to Librium 3 times a day. In by changing back, patient is more compliant and more calm Continue Seroquel and Prozac    Acute Respiratory failure (HCC) with hypoxia with underlying asthma and vocal cord dysfunction:  -not on O2 -levaquin started -nebs PRN -wean steroids  Leukocytosis -WBCs trending down -due to steroids vs infection (levaquin started and steroids weaned)    Obesity: Patient meets criteria with BMI greater than 30  Constipation -had BM 1/14 but x ray shows stool still in colon -bowel prep movantik   Code Status: Full code  Family Communication: no family at bedside  Disposition Plan: SNF when bed available--- await PASSAR #   Consultants:  Psychiatry  Critical care   Procedures:  Mechanical ventilation 1/6-1/7    Antibiotics:  Levaquin 1/13-present   Objective: BP 131/73 mmHg  Pulse 86  Temp(Src) 97.4 F (36.3 C) (Oral)  Resp 16  Ht 5\' 3"  (1.6 m)  Wt 90.3 kg (199 lb 1.2 oz)  BMI 35.27 kg/m2  SpO2 99%  LMP 08/16/2015  Intake/Output Summary (Last 24 hours) at 08/31/15 0824 Last data filed at 08/31/15 0813  Gross per 24 hour  Intake   2000 ml  Output   2101 ml  Net   -101 ml   Filed Weights   08/24/15 2131 08/25/15 2304 08/27/15 2205  Weight: 92.7 kg (204 lb 5.9 oz) 92.748 kg (204 lb 7.6 oz) 90.3 kg (199 lb 1.2 oz)    Exam:   General:  Alert and oriented 3, NAD- eating  Cardiovascular: Regular rate and rhythm, S1-S2,  Respiratory: Clear to auscultation bilaterally, upper airway pseudo-wheeze  abd distended and tender  Data Reviewed: Basic Metabolic Panel:  Recent Labs Lab 08/28/15 0935 08/29/15 0449 08/31/15 0612  NA 137  --  137  K 4.3  --  4.5  CL 102  --  99*  CO2 25  --  26  GLUCOSE 205*  --  142*  BUN 10  --  13  CREATININE 0.82 0.90 0.85  CALCIUM 9.6  --  9.4   Liver Function Tests: No results for input(s): AST, ALT, ALKPHOS, BILITOT, PROT, ALBUMIN in the last 168 hours. No results for input(s): LIPASE, AMYLASE in the last 168 hours. No results for input(s): AMMONIA in the last 168 hours. CBC:  Recent Labs Lab 08/27/15 0620 08/28/15 0935 08/29/15 0449 08/31/15 0612  WBC 24.3* 27.5* 24.4* 22.9*  HGB 9.9* 9.6*  9.5* 9.3*  HCT 32.1* 30.6* 30.4* 29.6*  MCV 76.8* 77.5* 79.0 77.3*  PLT 408* 372 371 360   Cardiac Enzymes:   No results for input(s): CKTOTAL, CKMB, CKMBINDEX, TROPONINI in the last 168 hours. BNP (last 3 results)  Recent Labs  08/26/15 2025  BNP 7.4    ProBNP (last 3 results) No results for input(s): PROBNP in the last 8760 hours.  CBG: No results for input(s): GLUCAP in the last 168 hours.  Recent Results (from the past 240 hour(s))  MRSA PCR Screening     Status: None   Collection Time: 08/22/15  2:26 AM  Result Value  Ref Range Status   MRSA by PCR NEGATIVE NEGATIVE Final    Comment:        The GeneXpert MRSA Assay (FDA approved for NASAL specimens only), is one component of a comprehensive MRSA colonization surveillance program. It is not intended to diagnose MRSA infection nor to guide or monitor treatment for MRSA infections.      Studies: Dg Abd 2 Views  08/30/2015  CLINICAL DATA:  Periumbilical region pain for 4 days EXAM: ABDOMEN - 2 VIEW COMPARISON:  August 22, 2015 FINDINGS: Supine and left lateral decubitus images obtained. There is diffuse stool throughout colon. No bowel dilatation or air-fluid level suggesting obstruction. No free air apparent. There is postoperative change at L5 and S1. Lung bases clear. IMPRESSION: Diffuse stool throughout colon. No demonstrable obstruction or free air. Electronically Signed   By: Bretta Bang III M.D.   On: 08/30/2015 19:52    Scheduled Meds: . ALPRAZolam  0.5 mg Oral TID  . arformoterol  15 mcg Nebulization BID  . budesonide (PULMICORT) nebulizer solution  0.5 mg Nebulization BID  . enoxaparin (LOVENOX) injection  40 mg Subcutaneous Daily  . FLUoxetine  40 mg Oral QHS  . fluticasone  2 spray Each Nare Daily  . levofloxacin  500 mg Oral Daily  . naloxegol oxalate  25 mg Oral Daily  . oxyCODONE  10 mg Oral Q12H  . pantoprazole  40 mg Oral Q1200  . predniSONE  30 mg Oral Q breakfast  . pregabalin  75 mg Oral BID  . QUEtiapine  100 mg Oral BID    Continuous Infusions:    Time spent: 25 minutes   JESSICA U [facility]  [facility] Pager [phone] . If 7PM-7AM, please contact night-coverage at [email or web address], password [facility] 08/31/2015, 8:24 AM  LOS: 9 days

## 2015-08-31 NOTE — Consult Note (Signed)
Julie Kerr   Reason for Kerr:  Substance abuse and anxiety disorder Referring Physician:  Dr. Conley Canal Patient Identification: Julie Kerr MRN:  [id number] Principal Diagnosis: Polysubstance abuse Diagnosis:   Patient Active Problem List   Diagnosis Date Noted  . Obesity [E66.9] 08/24/2015  . Tobacco abuse [Z72.0]   . Cocaine abuse [F14.10]   . Acute respiratory distress (HCC) [R06.00]   . Leukocytosis [D72.829]   . Anxiety and depression [F41.8]   . Substance abuse [F19.10]   . Asthma [J45.909] 07/27/2015  . Respiratory failure ([place]) [J96.90] 07/27/2015  . Vocal cord dysfunction [J38.3]   . Frequent falls [R29.6] 05/29/2015  . COPD (chronic obstructive pulmonary disease) ([place]) [J44.9] 05/08/2015  . Alcohol abuse [F10.10]   . Depression [F32.9]   . Rectal bleeding [K62.5] 10/08/2014  . Blood in stool [K92.1]   . Adjustment disorder with anxious mood [F43.22]   . 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 Chronic asthmatic bronchitis ([place]) [J44.9, V61.224] 08/21/2014  . Cough [R05]   . Polysubstance abuse [F19.10] 07/28/2014  . Hyperglycemia [R73.9] 05/07/2014  . Generalized anxiety disorder [F41.1] 01/20/2014  . Panic attacks [F41.0] 01/20/2014  . Upper airway cough syndrome, severe, with clinical VCD [R05] 12/07/2013  . Stridor [R06.1] 10/29/2013  . Anemia [D64.9] 08/04/2013  . Tobacco use disorder [F17.200] 06/17/2013  . Chronic pain syndrome [G89.4] 06/17/2013    Total Time spent with patient: 1 hour  Subjective:   Semaj Kerr is a 40 y.o. female patient admitted with substance intoxication and cough.  HPI:  Julie Kerr is a 68 female seen for psychiatric consultation and evaluation of increased symptoms of depression, anxiety and substance abuse. Patient reportedly smoking marijuana and abusing crack cocaine for the last 6 months, her last drug use is yesterday. Patient reported she has been living alone, disabled and  has a home health worker visit her. Patient reportedly received medication management while she was hospitalized during last time in the [facility]. Patient reportedly missed [place] both refuse to provide medication management for her and recommended counseling services. Patient also visit: [facility] who also refused to prescribe medications Xanax. Patient stated she has a side effect of the medication from Cymbalta and BuSpar which she cannot take it. Patient is willing to take her Prozac and Neurontin. Patient stated Neurontin does not work for her. Patient is currently taking Seroquel, Prozac and restarted as Xanax as needed. Patient denies suicidal ideation, homicidal ideation, auditory visual hallucinations. Patient reportedly has substance abuse treatment at [facility] and are currently in the past she also has a pain management secondary to chronic back pain. Patient urine drug screen is positive for benzos, opiates and cocaine. Past Psychiatric History: Patient has no behavioral health admissions but received outpatient medication management from [place] and at [facility] as outpatient.  Interval history: Patient seen today for psychiatric consultation follow-up. Patient has been compliant with her medication management and reportedly has no adverse effects. Patient denies symptoms of depression, anxiety, insomnia, psychosis, agitation or aggressive behaviors. Patient reportedly feels much better since she was placed on current medication regimen. Patient looking forward to get better and go outpatient psychiatric services when medically stable. Patient denies current withdrawal symptoms of drug of abuse. Patient is calm and cooperative, awake, alert and oriented does not appear to be responding to the internal stimuli. Patient denies current suicidal/homicidal ideation, intention or plans. Patient is somewhat disappointed no  family members has been visiting her while in the hospital.  Risk to Self: Is patient at risk for suicide?: No Risk to Others:   Prior Inpatient Therapy:   Prior Outpatient Therapy:    Past Medical History:  Past Medical History  Diagnosis Date  . Asthma   . Anxiety   . Bronchitis   . Heart murmur   . Shortness of breath   . Hypertension   . Vocal cord dysfunction   . Chronic narcotic dependence [facility])     Past Surgical History  Procedure Laterality Date  . Spinal fusion  11/2012    Performed in [facility] by Dr. Jimmye Norman  . Tracheostomy  December 2015   Family History:  Family History  Problem Relation Age of Onset  . HIV Mother   . Heart disease Father   . CVA Father   . Heart disease Other   . Emphysema Maternal Grandmother     smoked  . Asthma Sister   . Clotting disorder Sister   . Clotting disorder Maternal Grandmother   . Lung cancer Maternal Grandmother     smoked   Family Psychiatric  History: Patient denied family history of substance abuse and reportedly has a sister with panic attacks and heart problems.  Social History:  History  Alcohol Use  . 0.0 oz/week  . 0 Standard drinks or equivalent per week    Comment: occasional     History  Drug Use No    Social History   Social History  . Marital Status: Single    Spouse Name: N/A  . Number of Children: N/A  . Years of Education: N/A   Social History Main Topics  . Smoking status: Former Smoker -- 2.00 packs/day for 17 years    Types: Cigarettes    Quit date: 06/15/2014  . Smokeless tobacco: Never Used  . Alcohol Use: 0.0 oz/week    0 Standard drinks or equivalent per week     Comment: occasional  . Drug Use: No  . Sexual Activity: Yes    Birth Control/ Protection: Other-see comments     Comment: female partners only   Other Topics Concern  . None   Social History Narrative   Additional Social History: Living alone and walks with walker and home health visit her.                           Allergies:   Allergies  Allergen Reactions  . Robitussin Dm [Dextromethorphan-Guaifenesin] Nausea And Vomiting  . Chocolate Hives  . Suboxone [Buprenorphine Hcl-Naloxone Hcl] Other (See Comments)    Aggressive behavior  . Nsaids Hives  . [place], Purified Hives  . Tramadol Hives    Labs:  Results for orders placed or performed during the hospital encounter of 08/21/15 (from the past 48 hour(s))  Procalcitonin     Status: None   Collection Time: 08/31/15  6:12 AM  Result Value Ref Range   Procalcitonin <0.10 ng/mL    Comment:        Interpretation: PCT (Procalcitonin) <= 0.5 ng/mL: Systemic infection (sepsis) is not likely. Local bacterial infection is possible. (NOTE)         ICU PCT Algorithm               Non ICU PCT Algorithm    ----------------------------     ------------------------------         PCT < 0.25 ng/mL  PCT < 0.1 ng/mL     Stopping of antibiotics            Stopping of antibiotics       strongly encouraged.               strongly encouraged.    ----------------------------     ------------------------------       PCT level decrease by               PCT < 0.25 ng/mL       >= 80% from peak PCT       OR PCT 0.25 - 0.5 ng/mL          Stopping of antibiotics                                             encouraged.     Stopping of antibiotics           encouraged.    ----------------------------     ------------------------------       PCT level decrease by              PCT >= 0.25 ng/mL       < 80% from peak PCT        AND PCT >= 0.5 ng/mL            Continuin g antibiotics                                              encouraged.       Continuing antibiotics            encouraged.    ----------------------------     ------------------------------     PCT level increase compared          PCT > 0.5 ng/mL         with peak PCT AND          PCT >= 0.5 ng/mL             Escalation of antibiotics                                           strongly encouraged.      Escalation of antibiotics        strongly encouraged.   Basic metabolic panel     Status: Abnormal   Collection Time: 08/31/15  6:12 AM  Result Value Ref Range   Sodium 137 135 - 145 mmol/L   Potassium 4.5 3.5 - 5.1 mmol/L   Chloride 99 (L) 101 - 111 mmol/L   CO2 26 22 - 32 mmol/L   Glucose, Bld 142 (H) 65 - 99 mg/dL   BUN 13 6 - 20 mg/dL   Creatinine, Ser 0.85 0.44 - 1.00 mg/dL   Calcium 9.4 8.9 - 10.3 mg/dL   GFR calc non Af Amer >60 >60 mL/min   GFR calc Af Amer >60 >60 mL/min    Comment: (NOTE) The eGFR has been calculated using the CKD EPI equation. This calculation has not been validated in all clinical situations. eGFR's persistently <60 mL/min signify possible Chronic Kidney Disease.  Anion gap 12 5 - 15  CBC     Status: Abnormal   Collection Time: 08/31/15  6:12 AM  Result Value Ref Range   WBC 22.9 (H) 4.0 - 10.5 K/uL   RBC 3.83 (L) 3.87 - 5.11 MIL/uL   Hemoglobin 9.3 (L) 12.0 - 15.0 g/dL   HCT 29.6 (L) 36.0 - 46.0 %   MCV 77.3 (L) 78.0 - 100.0 fL   MCH 24.3 (L) 26.0 - 34.0 pg   MCHC 31.4 30.0 - 36.0 g/dL   RDW 18.1 (H) 11.5 - 15.5 %   Platelets 360 150 - 400 K/uL    Current Facility-Administered Medications  Medication Dose Route Frequency Provider Last Rate Last Dose  . ALPRAZolam Duanne Moron) tablet 0.5 mg  0.5 mg Oral TID Annita Brod, MD   0.5 mg at 08/31/15 1009  . arformoterol (BROVANA) nebulizer solution 15 mcg  15 mcg Nebulization BID Juanito Doom, MD   15 mcg at 08/31/15 0949  . benzonatate (TESSALON) capsule 200 mg  200 mg Oral TID PRN Delfina Redwood, MD   200 mg at 08/30/15 2134  . budesonide (PULMICORT) nebulizer solution 0.5 mg  0.5 mg Nebulization BID Juanito Doom, MD   0.5 mg at 08/31/15 0946  . enoxaparin (LOVENOX) injection 40 mg  40 mg Subcutaneous Daily Dannielle Burn, MD   40 mg at 08/31/15 1011  . FLUoxetine (PROZAC) capsule 40 mg  40 mg Oral QHS Delfina Redwood, MD   40 mg at 08/30/15  2134  . fluticasone (FLONASE) 50 MCG/ACT nasal spray 2 spray  2 spray Each Nare Daily Delfina Redwood, MD   2 spray at 08/30/15 1042  . ipratropium-albuterol (DUONEB) 0.5-2.5 (3) MG/3ML nebulizer solution 3 mL  3 mL Nebulization Q4H PRN Juanito Doom, MD   3 mL at 08/28/15 1708  . lactulose (CHRONULAC) 10 GM/15ML solution 20 g  20 g Oral BID PRN Geradine Girt, DO   20 g at 08/30/15 2134  . levofloxacin (LEVAQUIN) tablet 500 mg  500 mg Oral Daily Annita Brod, MD   500 mg at 08/31/15 1009  . naloxegol oxalate (MOVANTIK) tablet 25 mg  25 mg Oral Daily Geradine Girt, DO   25 mg at 08/31/15 1008  . oxyCODONE (Oxy IR/ROXICODONE) immediate release tablet 5 mg  5 mg Oral Q4H PRN Juanito Doom, MD   5 mg at 08/31/15 1009  . oxyCODONE (OXYCONTIN) 12 hr tablet 10 mg  10 mg Oral Q12H Annita Brod, MD   10 mg at 08/31/15 1009  . pantoprazole (PROTONIX) EC tablet 40 mg  40 mg Oral Q1200 Juanito Doom, MD   40 mg at 08/30/15 1315  . phenol (CHLORASEPTIC) mouth spray 1 spray  1 spray Mouth/Throat PRN Juanito Doom, MD   1 spray at 08/26/15 2041  . polyethylene glycol powder (GLYCOLAX/MIRALAX) container 255 g  1 Container Oral Once Jessica U Vann, DO      . predniSONE (DELTASONE) tablet 30 mg  30 mg Oral Q breakfast Geradine Girt, DO   30 mg at 08/31/15 0856  . pregabalin (LYRICA) capsule 75 mg  75 mg Oral BID Rigoberto Noel, MD   75 mg at 08/31/15 1009  . QUEtiapine (SEROQUEL) tablet 100 mg  100 mg Oral BID Ambrose Finland, MD   100 mg at 08/31/15 1009  . zolpidem (AMBIEN) tablet 10 mg  10 mg Oral QHS PRN Delfina Redwood, MD  10 mg at 08/30/15 2134    Musculoskeletal: Strength & Muscle Tone: decreased Gait & Station: unable to stand Patient leans: N/A  Psychiatric Specialty Exam: ROS:   Blood pressure 131/73, pulse 86, temperature 97.4 F (36.3 C), temperature source Oral, resp. rate 16, height _0  (1.6 m), weight 90.3 kg (199 lb 1.2 oz), last menstrual period  08/16/2015, SpO2 98 %.Body mass index is 35.27 kg/(m^2).  General Appearance: Casual    Eye Contact::  Good  Speech:  Clear and Coherent  Volume:  Normal  Mood:  Euthymic  Affect:  Depressed  Thought Process:  Coherent and Goal Directed  Orientation:  Full (Time, Place, and Person)  Thought Content:  WDL  Suicidal Thoughts:  No  Homicidal Thoughts:  No  Memory:  Immediate;   Good Recent;   Good  Judgement:  Intact  Insight:  Fair  Psychomotor Activity:  Decreased  Concentration:  Good  Recall:  Good  Fund of Knowledge:Good  Language: Good  Akathisia:  Negative  Handed:  Right  AIMS (if indicated):     Assets:  Communication Skills Desire for Improvement Financial Resources/Insurance Housing Leisure Time Resilience Talents/Skills Transportation  ADL's:  Intact  Cognition: WNL  Sleep:      Treatment Plan Summary: Daily contact with patient to assess and evaluate symptoms and progress in treatment and Medication management   Patient has no safety concerns Continue chlordiazepoxide 10 mg 3 times daily for anxiety and alcohol withdrawal symptoms Continue Seroquel 100 mg twice daily for mood swings Continue Prozac 40 mg daily for depression and anxiety  Patient will be referred to the outpatient medical management and medically stable Appreciate psychiatric consultation and we sign off at this time  Please contact [phone] or [phone] if needs further assistance  Disposition: Patient does not meet criteria for psychiatric inpatient admission. Supportive therapy provided about ongoing stressors.  Nayanna Seaborn,JANARDHAHA R. 08/31/2015 11:30 AM

## 2015-08-31 NOTE — Care Management Important Message (Signed)
Important Message  Patient Details  Name: Julie Kerr MRN: [id number] Date of Birth: 01/17/1976   Medicare Important Message Given:  Yes    [name] P [name] 08/31/2015, 4:20 PM

## 2015-09-01 MED ORDER — OXYCODONE HCL ER 10 MG PO T12A: 10.0000 mg | EXTENDED_RELEASE_TABLET | Freq: Two times a day (BID) | ORAL | Status: DC

## 2015-09-01 MED ORDER — QUETIAPINE FUMARATE 100 MG PO TABS: 100.0000 mg | ORAL_TABLET | Freq: Two times a day (BID) | ORAL | Status: DC

## 2015-09-01 MED ORDER — LEVOFLOXACIN 500 MG PO TABS: 500.0000 mg | ORAL_TABLET | Freq: Every day | ORAL | Status: DC

## 2015-09-01 MED ORDER — OXYCODONE HCL 5 MG PO TABS: 5.0000 mg | ORAL_TABLET | ORAL | Status: DC | PRN

## 2015-09-01 MED ORDER — SORBITOL 70 % SOLN
960.0000 mL | TOPICAL_OIL | Freq: Once | ORAL | Status: AC
  Administered 2015-09-01: 960 mL via RECTAL
  Filled 2015-09-01: qty 240

## 2015-09-01 MED ORDER — ALPRAZOLAM 0.5 MG PO TABS: 0.5000 mg | ORAL_TABLET | Freq: Three times a day (TID) | ORAL | Status: DC

## 2015-09-01 MED ORDER — PANTOPRAZOLE SODIUM 40 MG PO TBEC: 40.0000 mg | DELAYED_RELEASE_TABLET | Freq: Every day | ORAL | Status: DC

## 2015-09-01 MED ORDER — BENZONATATE 200 MG PO CAPS: 200.0000 mg | ORAL_CAPSULE | Freq: Three times a day (TID) | ORAL | Status: DC | PRN

## 2015-09-01 MED ORDER — LACTULOSE 10 GM/15ML PO SOLN: 20.0000 g | Freq: Two times a day (BID) | ORAL | Status: DC | PRN

## 2015-09-01 MED ORDER — PREDNISONE 20 MG PO TABS: 20.0000 mg | ORAL_TABLET | Freq: Every day | ORAL | Status: DC

## 2015-09-01 MED ORDER — PREDNISONE 20 MG PO TABS: ORAL_TABLET | ORAL | Status: DC

## 2015-09-01 NOTE — Progress Notes (Signed)
Late Entry for 08/31/15  Reviewed surveillance video footage for 08/29/15 0945 pm the date of patients fall. Noted in video that patient was sitting at the sink on the bedside commode call bell within reach on the sink. Noted patient moved bath basin which caused call bell to fall on the floor. Patient noted to stand up and bend over and retrieve call bell from under the table. Patient returned to seating position on bedside commode and intentionally hit head on corner of sink fixture repeatedly. Patient then intentionally, gently lowered herself to her knees between the bedside commode and the sink and was noted to intentionally hit her head on the sink fixture (approx 2-3 times), rearrange bedside commode and walker, and then lay on the floor and wait for staff to arrive in her room. Dr. Benjamine Mola notified of the above information.  Julie Kerr, Charlyne Quale

## 2015-09-01 NOTE — Progress Notes (Signed)
Physical Therapy Treatment Patient Details Name: Julie Kerr MRN: [id number] DOB: 1976-06-04 Today's Date: 09/01/2015    History of Present Illness 11F w/ history of vocal cord dysfunction with multiple prior intubations and prior tracheotomy who presented with complaints of increased work of breathing, wheezing and cough. Attempts were made to palliate her symptoms with benzos and biPAP, but her respiratory distress persisted and she was intubated.    PT Comments    Pt con't to have inconsistent presentation compared to observation of function. Pt con't to have LE weakness R > L, impaired balance, falls risk, and requires increased assist for all mobilities and ADLs. Pt unable to care for self safely at this time. Pt con't to benefit from ST-SNF upon d/c to maximize functional recovery for safe transition home alone.  Follow Up Recommendations  SNF     Equipment Recommendations  None recommended by PT    Recommendations for Other Services       Precautions / Restrictions Precautions Precautions: Fall Precaution Comments: pt had a reported fall this hospital stay Restrictions Weight Bearing Restrictions: No    Mobility  Bed Mobility Overal bed mobility: Needs Assistance Bed Mobility: Supine to Sit;Sit to Supine Rolling: Modified independent (Device/Increase time)   Supine to sit: Min assist;HOB elevated Sit to supine: Min assist   General bed mobility comments: assist for trunk elevation and to bring LEs back into bed  Transfers Overall transfer level: Needs assistance Equipment used: Rolling walker (2 wheeled) Transfers: Sit to/from [employer] Sit to Stand: Min assist;Mod assist;+2 physical assistance;+2 safety/equipment Stand pivot transfers: Min assist;Mod assist;+2 physical assistance;+2 safety/equipment       General transfer comment: pt std pvt to/from BSC, upon standing pt with LE weakness, bilat knee buckling until PT instructed pt to lock  out knees  Ambulation/Gait Ambulation/Gait assistance: Min assist;+2 safety/equipment Ambulation Distance (Feet): 10 Feet Assistive device: Rolling walker (2 wheeled) Gait Pattern/deviations: Step-through pattern;Decreased step length - right;Decreased stance time - left;Decreased dorsiflexion - right Gait velocity: slow Gait velocity interpretation: Below normal speed for age/gender General Gait Details: pt with inconsitent placement of R foot, varied step length and step height   Stairs            Wheelchair Mobility    Modified Rankin (Stroke Patients Only)       Balance Overall balance assessment: Needs assistance Sitting-balance support: Feet supported;Bilateral upper extremity supported       Standing balance support: Bilateral upper extremity supported Standing balance-Leahy Scale: Poor Standing balance comment: relies on RW                    Cognition Arousal/Alertness: Awake/alert Behavior During Therapy: Flat affect Overall Cognitive Status: History of cognitive impairments - at baseline                      Exercises      General Comments General comments (skin integrity, edema, etc.): pt assist to commode, pt able to perform hygiene with set up and supervision in standing      Pertinent Vitals/Pain Pain Assessment: 0-10 Pain Score: 5  Pain Location: back and R leg    Home Living                      Prior Function            PT Goals (current goals can now be found in the care plan section) Acute Rehab PT Goals  Patient Stated Goal: walk Progress towards PT goals: Progressing toward goals    Frequency  Min 3X/week    PT Plan Current plan remains appropriate    Co-evaluation             End of Session Equipment Utilized During Treatment: Gait belt Activity Tolerance: Patient limited by fatigue;Patient limited by pain Patient left: in bed;with bed alarm set;with call bell/phone within reach      Time: 2956-2130 PT Time Calculation (min) (ACUTE ONLY): 29 min  Charges:  $Gait Training: 8-22 mins $Therapeutic Activity: 8-22 mins                    G Codes:      Marcene Brawn 09/01/2015, 3:09 PM  Lewis Shock, PT, DPT Pager #: [phone] Office #: [phone]

## 2015-09-01 NOTE — Clinical Social Work Placement (Signed)
   CLINICAL SOCIAL WORK PLACEMENT  NOTE 09/01/15 - DISCHARGED TO [facility]  Date:  09/01/2015  Patient Details  Name: Julie Kerr MRN: [id number] Date of Birth: 10-16-1975  Clinical Social Work is seeking post-discharge placement for this patient at the Skilled  Nursing Facility level of care (*CSW will initial, date and re-position this form in  chart as items are completed):  Yes   Patient/family provided with [facility] Clinical Social Work Department's list of facilities offering this level of care within the geographic area requested by the patient (or if unable, by the patient's family).  Yes   Patient/family informed of their freedom to choose among providers that offer the needed level of care, that participate in Medicare, Medicaid or managed care program needed by the patient, have an available bed and are willing to accept the patient.  Yes   Patient/family informed of [facility]'s ownership interest in [facility] and [facility], as well as of the fact that they are under no obligation to receive care at these facilities.  PASRR submitted to EDS on 08/28/15     PASRR number received on  08/31/15     Existing PASRR number confirmed on       FL2 transmitted to all facilities in geographic area requested by pt/family on 08/25/15     FL2 transmitted to all facilities within larger geographic area on       Patient informed that his/her managed care company has contracts with or will negotiate with certain facilities, including the following:        Yes   Patient/family informed of bed offers received.  Patient chooses bed at [facility]     Physician recommends and patient chooses bed at      Patient to be transferred to  [facility] on  09/01/15.  Patient to be transferred to facility by  ambulance  Patient family notified on   of transfer.  Name of family member notified:        PHYSICIAN       Additional Comment:     _______________________________________________ Cristobal Goldmann, LCSW 09/01/2015, 8:49 PM

## 2015-09-01 NOTE — Progress Notes (Signed)
Patient being discharged to [facility] this evening. PIV removed per protocol. Patient's sister Bjorn Loser notified of patient's discharge. Skin is intact without any signs of pressure/breakdown. Report called to [facility]. [facility] to provide transportation.   Leanna Battles, RN.

## 2015-09-01 NOTE — Plan of Care (Signed)
Problem: Fluid Volume: Goal: Ability to maintain a balanced intake and output will improve Outcome: Completed/Met Date Met:  09/01/15 Excellent PO intake, voiding large amounts.  Problem: Bowel/Gastric: Goal: Will not experience complications related to bowel motility Outcome: Not Progressing SEVERELY constipated, to receive SMOG enema today.

## 2015-09-01 NOTE — Discharge Summary (Addendum)
Physician Discharge Summary  Julie Kerr ZOX:[id number] DOB: 22-Dec-1975 DOA: 08/21/2015  PCP: Jaclyn Shaggy, MD  Admit date: 08/21/2015 Discharge date: 09/01/2015  Time spent: 35 minutes  Recommendations for Outpatient Follow-up:  1. levaquin until 1/19 2. Bowel regimen 3. PRN O2  Patient also faked a fall in the hospital and purposefully hit head on sink several times -- caught on video  Discharge Diagnoses:  Principal Problem:   Polysubstance abuse Active Problems:   Chronic pain syndrome   Generalized anxiety disorder   Respiratory failure (HCC)   Vocal cord dysfunction   Obesity   Discharge Condition: improved  Diet recommendation: regular  Filed Weights   08/25/15 2304 08/27/15 2205 08/31/15 2100  Weight: 92.748 kg (204 lb 7.6 oz) 90.3 kg (199 lb 1.2 oz) 92.897 kg (204 lb 12.8 oz)    History of present illness:  A 40 year old female with past oral history of chronic pain, polysubstance abuse and vocal cord dysfunction admitted on 1/6 with acute restaurateur failure and was intubated and then self extubated the following day. Stabilized and transferred to hospitalist service by 1/9. Seen by psychiatry with medication adjustments. Patient evaluated and recommended for skilled nursing facility, of which but offers are pending.  Patient also "fell" this weekend which consisted of her throwing self on floor and banging head on sink several times. Constipation has been a major issue as well- have tried multiple medications with out results.   Hospital Course:  Polysubstance abuse: Counseled.  Chronic pain syndrome: continue current message NO ESCALATION  Generalized anxiety disorder: Seen by psychiatry, Xanax changed from Librium 3 times a day. In by changing back, patient is more compliant and more calm Continue Seroquel and Prozac   Acute Respiratory failure (HCC) with hypoxia with underlying asthma and vocal cord dysfunction:  -not on O2 - but requests PRN -levaquin  started -nebs PRN -wean steroids  Leukocytosis -WBCs trending down -wean steroids -levaquin until 1/19   Obesity: Patient meets criteria with BMI greater than 30  Constipation -had BM 1/14 but x ray shows stool still in colon -good BM 1/17 Needs bowel regimen  Procedures:    Consultations:  psych  Discharge Exam: Filed Vitals:   09/01/15 0500 09/01/15 0848  BP: 120/68 129/67  Pulse: 91 98  Temp: 98.9 F (37.2 C) 98.8 F (37.1 C)  Resp: 16 16    General: awake, NAD +BS, large BM today  Discharge Instructions   Discharge Instructions    Diet general    Complete by:  As directed      Discharge instructions    Complete by:  As directed   levaquin until 1/19 Bowel regimen for daily BMs     Increase activity slowly    Complete by:  As directed           Current Discharge Medication List    START taking these medications   Details  benzonatate (TESSALON) 200 MG capsule Take 1 capsule (200 mg total) by mouth 3 (three) times daily as needed for cough. Qty: 20 capsule, Refills: 0    lactulose (CHRONULAC) 10 GM/15ML solution Take 30 mLs (20 g total) by mouth 2 (two) times daily as needed for mild constipation. Qty: 240 mL, Refills: 0    levofloxacin (LEVAQUIN) 500 MG tablet Take 1 tablet (500 mg total) by mouth daily.    oxyCODONE (OXYCONTIN) 10 mg 12 hr tablet Take 1 tablet (10 mg total) by mouth every 12 (twelve) hours. Qty: 15 tablet, Refills: 0  predniSONE (DELTASONE) 20 MG tablet 20 mg x 2 days, then 10 mg x 2 days then d/c      CONTINUE these medications which have CHANGED   Details  ALPRAZolam (XANAX) 0.5 MG tablet Take 1 tablet (0.5 mg total) by mouth 3 (three) times daily. Qty: 20 tablet, Refills: 0    oxyCODONE (OXY IR/ROXICODONE) 5 MG immediate release tablet Take 1 tablet (5 mg total) by mouth every 4 (four) hours as needed for severe pain. Qty: 10 tablet, Refills: 0    pantoprazole (PROTONIX) 40 MG tablet Take 1 tablet (40 mg total)  by mouth daily at 12 noon.    QUEtiapine (SEROQUEL) 100 MG tablet Take 1 tablet (100 mg total) by mouth 2 (two) times daily.      CONTINUE these medications which have NOT CHANGED   Details  arformoterol (BROVANA) 15 MCG/2ML NEBU Take 2 mLs (15 mcg total) by nebulization 2 (two) times daily. Qty: 120 mL, Refills: 0    budesonide (PULMICORT) 0.5 MG/2ML nebulizer solution Take 2 mLs (0.5 mg total) by nebulization 2 (two) times daily. Qty: 120 mL, Refills: 1    ENSURE PLUS (ENSURE PLUS) LIQD Take 237 mLs by mouth 3 (three) times daily between meals.    FLUoxetine (PROZAC) 40 MG capsule Take 1 capsule (40 mg total) by mouth at bedtime. Qty: 30 capsule, Refills: 0    fluticasone (FLONASE) 50 MCG/ACT nasal spray Place 2 sprays into both nostrils 2 (two) times daily. Qty: 16 g, Refills: 3    ipratropium-albuterol (DUONEB) 0.5-2.5 (3) MG/3ML SOLN Take 3 mLs by nebulization every 4 (four) hours as needed (shortness of breath/wheezing). Qty: 360 mL, Refills: 2    Mometasone Furo-Formoterol Fum (DULERA IN) Inhale 1 puff into the lungs 2 (two) times daily.    montelukast (SINGULAIR) 10 MG tablet Take 10 mg by mouth at bedtime.    pregabalin (LYRICA) 75 MG capsule Take 1 capsule (75 mg total) by mouth 2 (two) times daily. Reported on 08/06/2015 Qty: 60 capsule, Refills: 1    zolpidem (AMBIEN) 10 MG tablet Take 10 mg by mouth at bedtime as needed for sleep.  Refills: 0    guaiFENesin (MUCINEX) 600 MG 12 hr tablet Take 1 tablet (600 mg total) by mouth 2 (two) times daily. Qty: 40 tablet, Refills: 0      STOP taking these medications     albuterol (PROAIR HFA) 108 (90 BASE) MCG/ACT inhaler      azelastine (ASTELIN) 0.1 % nasal spray      docusate sodium (COLACE) 100 MG capsule      morphine (MSIR) 30 MG tablet      Multiple Vitamin (MULTIVITAMIN WITH MINERALS) TABS tablet      nicotine (NICODERM CQ - DOSED IN MG/24 HR) 7 mg/24hr patch      acetaminophen-codeine (TYLENOL #3) 300-30  MG tablet      promethazine-codeine (PHENERGAN WITH CODEINE) 6.25-10 MG/5ML syrup        Allergies  Allergen Reactions  . Robitussin Dm [Dextromethorphan-Guaifenesin] Nausea And Vomiting  . Chocolate Hives  . Suboxone [Buprenorphine Hcl-Naloxone Hcl] Other (See Comments)    Aggressive behavior  . Nsaids Hives  . Rayon, Purified Hives  . Tramadol Hives   Follow-up Information    Follow up with Jaclyn Shaggy, MD In 1 week.   Specialty:  Family Medicine   Contact information:   [address] [place] [address] [phone]        The results of significant diagnostics  from this hospitalization (including imaging, microbiology, ancillary and laboratory) are listed below for reference.    Significant Diagnostic Studies: Dg Chest 2 View  08/26/2015  CLINICAL DATA:  Acute respiratory failure. Chest pain and shortness of breath. Cough and fever. EXAM: CHEST  2 VIEW COMPARISON:  Most recent chest radiograph 08/21/2015 FINDINGS: Cardiomediastinal contours are unchanged allowing for differences in technique. No pulmonary edema, question mild central vascular congestion persists. Mild perihilar atelectasis without consolidation to suggest pneumonia. No pleural effusion or pneumothorax. Osseous structures are unchanged. IMPRESSION: Mild perihilar atelectasis. Question of mild central vascular congestion, similar to prior. Electronically Signed   By: Rubye Oaks M.D.   On: 08/26/2015 18:32   Dg Lumbar Spine 2-3 Views  08/30/2015  CLINICAL DATA:  Acute onset of lower back pain, status post fall in hospital room. Initial encounter. EXAM: LUMBAR SPINE - 2-3 VIEW COMPARISON:  Lumbar spine radiographs performed 04/28/2015 FINDINGS: There is no evidence of fracture or subluxation. Vertebral bodies demonstrate normal height and alignment. The patient is status post lumbar spinal fusion at L5-S1. Intervertebral disc spaces are preserved. The visualized bowel gas pattern is unremarkable in  appearance; air and stool are noted within the colon. The sacroiliac joints are within normal limits. IMPRESSION: No evidence of fracture or subluxation along the lumbar spine. Status post lumbar spinal fusion at L5-S1. Electronically Signed   By: Roanna Raider M.D.   On: 08/30/2015 02:27   Dg Chest Portable 1 View  08/21/2015  CLINICAL DATA:  Hypertensive and tachycardia earlier. Intubated. History of asthma and vocal cord dysfunction. EXAM: PORTABLE CHEST 1 VIEW COMPARISON:  Chest x-rays from earlier same day and from 07/27/2015, 04/24/2015 and 03/25/2015. FINDINGS: Endotracheal tube in place, well positioned with tip approximately 2.5 cm above the level of the carina. Heart size is normal. Overall cardiomediastinal silhouette is normal in size and configuration. There is mild central pulmonary vascular congestion which may be related to resuscitation efforts. Lungs otherwise clear. No pleural effusions seen. No pneumothorax seen. Osseous structures about the chest are unremarkable. IMPRESSION: 1. Endotracheal tube well positioned with tip approximately 2.5 cm above the level of the carina. 2. Perhaps mild central pulmonary vascular congestion which may be related to resuscitation efforts. Lungs otherwise clear. 3. Heart size is normal. Electronically Signed   By: Bary Richard M.D.   On: 08/21/2015 20:31   Dg Chest Port 1 View  08/21/2015  CLINICAL DATA:  Respiratory distress EXAM: PORTABLE CHEST 1 VIEW COMPARISON:  07/27/2015 FINDINGS: The heart size and mediastinal contours are within normal limits. Both lungs are clear. The visualized skeletal structures are unremarkable. IMPRESSION: No active disease. Electronically Signed   By: Signa Kell M.D.   On: 08/21/2015 18:56   Dg Abd 2 Views  08/30/2015  CLINICAL DATA:  Periumbilical region pain for 4 days EXAM: ABDOMEN - 2 VIEW COMPARISON:  August 22, 2015 FINDINGS: Supine and left lateral decubitus images obtained. There is diffuse stool throughout  colon. No bowel dilatation or air-fluid level suggesting obstruction. No free air apparent. There is postoperative change at L5 and S1. Lung bases clear. IMPRESSION: Diffuse stool throughout colon. No demonstrable obstruction or free air. Electronically Signed   By: Bretta Bang III M.D.   On: 08/30/2015 19:52   Dg Abd Portable 1v  08/22/2015  CLINICAL DATA:  Orogastric tube placement.  Initial encounter. EXAM: PORTABLE ABDOMEN - 1 VIEW COMPARISON:  CT of the abdomen and pelvis from 11/26/2014 FINDINGS: The patient's enteric tube is noted ending  overlying the body of the stomach. The visualized bowel gas pattern is unremarkable. Scattered air and stool filled loops of colon are seen; no abnormal dilatation of small bowel loops is seen to suggest small bowel obstruction. No free intra-abdominal air is identified, though evaluation for free air is limited on a single supine view. The visualized osseous structures are within normal limits; the sacroiliac joints are unremarkable in appearance. Lumbosacral spinal fusion hardware is noted. The visualized lung bases are essentially clear. IMPRESSION: Enteric tube noted ending overlying the body of the stomach. Electronically Signed   By: Roanna Raider M.D.   On: 08/22/2015 03:18    Microbiology: No results found for this or any previous visit (from the past 240 hour(s)).   Labs: Basic Metabolic Panel:  Recent Labs Lab 08/28/15 0935 08/29/15 0449 08/31/15 0612  NA 137  --  137  K 4.3  --  4.5  CL 102  --  99*  CO2 25  --  26  GLUCOSE 205*  --  142*  BUN 10  --  13  CREATININE 0.82 0.90 0.85  CALCIUM 9.6  --  9.4   Liver Function Tests: No results for input(s): AST, ALT, ALKPHOS, BILITOT, PROT, ALBUMIN in the last 168 hours. No results for input(s): LIPASE, AMYLASE in the last 168 hours. No results for input(s): AMMONIA in the last 168 hours. CBC:  Recent Labs Lab 08/27/15 0620 08/28/15 0935 08/29/15 0449 08/31/15 0612  WBC 24.3*  27.5* 24.4* 22.9*  HGB 9.9* 9.6* 9.5* 9.3*  HCT 32.1* 30.6* 30.4* 29.6*  MCV 76.8* 77.5* 79.0 77.3*  PLT 408* 372 371 360   Cardiac Enzymes: No results for input(s): CKTOTAL, CKMB, CKMBINDEX, TROPONINI in the last 168 hours. BNP: BNP (last 3 results)  Recent Labs  08/26/15 2025  BNP 7.4    ProBNP (last 3 results) No results for input(s): PROBNP in the last 8760 hours.  CBG: No results for input(s): GLUCAP in the last 168 hours.     Signed:  Joseph Art DO  [facility] 09/01/2015, 3:57 PM

## 2015-09-01 NOTE — Progress Notes (Signed)
PROGRESS NOTE  Julie Kerr ZOX:[id number] DOB: 08/13/1976 DOA: 08/21/2015 PCP: Jaclyn Shaggy, MD  HPI A 40 year old female with past oral history of chronic pain, polysubstance abuse and vocal cord dysfunction admitted on 1/6 with acute restaurateur failure and was intubated and then self extubated the following day. Stabilized and transferred to hospitalist service by 1/9. Seen by psychiatry with medication adjustments. Patient evaluated and recommended for skilled nursing facility, of which but offers are pending.  Patient also "fell" this weekend which consisted of her throwing self on floor and banging head on sink several times. Constipation has been a major issue as well- have tried multiple medications with out results.    Assessment/Plan: Polysubstance abuse: Counseled.  Chronic pain syndrome: continue current message NO ESCALATION  Generalized anxiety disorder: Seen by psychiatry, Xanax changed from Librium 3 times a day. In by changing back, patient is more compliant and more calm Continue Seroquel and Prozac    Acute Respiratory failure (HCC) with hypoxia with underlying asthma and vocal cord dysfunction:  -not on O2 -levaquin started -nebs PRN -wean steroids  Leukocytosis -WBCs trending down -due to steroids vs infection (levaquin started and steroids weaned)    Obesity: Patient meets criteria with BMI greater than 30  Constipation -had BM 1/14 but x ray shows stool still in colon -bowel prep movantik x 3  -enema ordered 1/17 + flutus  Code Status: Full code  Family Communication: no family at bedside  Disposition Plan: SNF when bed available--- await PASSAR # and bowel movement   Consultants:  Psychiatry  Critical care   Procedures:  Mechanical ventilation 1/6-1/7   Antibiotics:  Levaquin 1/13-present   Objective: BP 129/67 mmHg  Pulse 98  Temp(Src) 98.8 F (37.1 C) (Oral)  Resp 16  Ht  (1.6 m)  Wt 92.897 kg (204 lb 12.8 oz)  BMI  36.29 kg/m2  SpO2 98%  LMP 08/16/2015  Intake/Output Summary (Last 24 hours) at 09/01/15 1340 Last data filed at 09/01/15 1002  Gross per 24 hour  Intake   1662 ml  Output   5000 ml  Net  -3338 ml   Filed Weights   08/25/15 2304 08/27/15 2205 08/31/15 2100  Weight: 92.748 kg (204 lb 7.6 oz) 90.3 kg (199 lb 1.2 oz) 92.897 kg (204 lb 12.8 oz)    Exam:   General:  Alert and oriented 3, NAD- eating  Cardiovascular: Regular rate and rhythm, S1-S2,  Respiratory: Clear to auscultation bilaterally, upper airway pseudo-wheeze  abd distended and tender Distended but decreased bowel sounds  Data Reviewed: Basic Metabolic Panel:  Recent Labs Lab 08/28/15 0935 08/29/15 0449 08/31/15 0612  NA 137  --  137  K 4.3  --  4.5  CL 102  --  99*  CO2 25  --  26  GLUCOSE 205*  --  142*  BUN 10  --  13  CREATININE 0.82 0.90 0.85  CALCIUM 9.6  --  9.4   Liver Function Tests: No results for input(s): AST, ALT, ALKPHOS, BILITOT, PROT, ALBUMIN in the last 168 hours. No results for input(s): LIPASE, AMYLASE in the last 168 hours. No results for input(s): AMMONIA in the last 168 hours. CBC:  Recent Labs Lab 08/27/15 0620 08/28/15 0935 08/29/15 0449 08/31/15 0612  WBC 24.3* 27.5* 24.4* 22.9*  HGB 9.9* 9.6* 9.5* 9.3*  HCT 32.1* 30.6* 30.4* 29.6*  MCV 76.8* 77.5* 79.0 77.3*  PLT 408* 372 371 360   Cardiac Enzymes:   No results for input(s):  CKTOTAL, CKMB, CKMBINDEX, TROPONINI in the last 168 hours. BNP (last 3 results)  Recent Labs  08/26/15 2025  BNP 7.4    ProBNP (last 3 results) No results for input(s): PROBNP in the last 8760 hours.  CBG: No results for input(s): GLUCAP in the last 168 hours.  No results found for this or any previous visit (from the past 240 hour(s)).   Studies: No results found.  Scheduled Meds: . ALPRAZolam  0.5 mg Oral TID  . arformoterol  15 mcg Nebulization BID  . budesonide (PULMICORT) nebulizer solution  0.5 mg Nebulization BID  .  enoxaparin (LOVENOX) injection  40 mg Subcutaneous Daily  . FLUoxetine  40 mg Oral QHS  . fluticasone  2 spray Each Nare Daily  . levofloxacin  500 mg Oral Daily  . naloxegol oxalate  25 mg Oral Daily  . oxyCODONE  10 mg Oral Q12H  . pantoprazole  40 mg Oral Q1200  . predniSONE  30 mg Oral Q breakfast  . pregabalin  75 mg Oral BID  . QUEtiapine  100 mg Oral BID  . sorbitol, milk of mag, mineral oil, glycerin (SMOG) enema  960 mL Rectal Once    Continuous Infusions:    Time spent: 25 minutes   Karinna Beadles [facility]  [facility] Pager [phone] . If 7PM-7AM, please contact night-coverage at [email or web address], password [facility] 09/01/2015, 1:40 PM  LOS: 10 days

## 2015-09-04 ENCOUNTER — Ambulatory Visit: Payer: Commercial Managed Care - HMO | Admitting: Internal Medicine

## 2015-09-07 ENCOUNTER — Encounter: Payer: Self-pay | Admitting: *Deleted

## 2015-09-08 ENCOUNTER — Telehealth: Payer: Self-pay

## 2015-09-08 NOTE — Telephone Encounter (Signed)
Call received from the patient requesting the status of her referral for pain management. She said that she is still at [facility] and is not sure when she is going home; but they are having a meeting this afternoon about her discharge. The patient requested a call back to # [phone].   Discussed the patient's inquiry with Dr Venetia Night and it was determined that a referral was made to [facility] and they [facility]) will contact the patient.  Attempted to contact the patient to update her on the status of the pain management referral and provide her with the contact # for [facility] # [phone].  A call was placed to # [phone] and the message noted that person being called is unavailable now.

## 2015-09-09 ENCOUNTER — Telehealth: Payer: Self-pay

## 2015-09-09 ENCOUNTER — Encounter: Payer: Self-pay | Admitting: *Deleted

## 2015-09-09 ENCOUNTER — Other Ambulatory Visit: Payer: Self-pay | Admitting: *Deleted

## 2015-09-09 NOTE — Patient Outreach (Signed)
[facility]) Care Management  [facility] Social Work  09/09/2015  Julie Kerr June 19, 1976 [id number]  Subjective:    "I need help getting my pain medications when I leave here and go back home".  "I will also need a hospital bed at home".  Objective:   CSW agreed to follow patient at [facility] [place] where patient currently resides to receive short-term rehabilitative services, to assess and assist with discharge planning needs and services.  Current Medications:  Current Outpatient Prescriptions  Medication Sig Dispense Refill  . ALPRAZolam (XANAX) 0.5 MG tablet Take 1 tablet (0.5 mg total) by mouth 3 (three) times daily. 20 tablet 0  . arformoterol (BROVANA) 15 MCG/2ML NEBU Take 2 mLs (15 mcg total) by nebulization 2 (two) times daily. 120 mL 0  . benzonatate (TESSALON) 200 MG capsule Take 1 capsule (200 mg total) by mouth 3 (three) times daily as needed for cough. 20 capsule 0  . budesonide (PULMICORT) 0.5 MG/2ML nebulizer solution Take 2 mLs (0.5 mg total) by nebulization 2 (two) times daily. 120 mL 1  . ENSURE PLUS (ENSURE PLUS) LIQD Take 237 mLs by mouth 3 (three) times daily between meals.    Marland Kitchen FLUoxetine (PROZAC) 40 MG capsule Take 1 capsule (40 mg total) by mouth at bedtime. (Patient taking differently: Take 40 mg by mouth at bedtime as needed (sleep). ) 30 capsule 0  . fluticasone (FLONASE) 50 MCG/ACT nasal spray Place 2 sprays into both nostrils 2 (two) times daily. (Patient taking differently: Place 2 sprays into both nostrils daily. ) 16 g 3  . guaiFENesin (MUCINEX) 600 MG 12 hr tablet Take 1 tablet (600 mg total) by mouth 2 (two) times daily. (Patient not taking: Reported on 08/22/2015) 40 tablet 0  . ipratropium-albuterol (DUONEB) 0.5-2.5 (3) MG/3ML SOLN Take 3 mLs by nebulization every 4 (four) hours as needed (shortness of breath/wheezing). 360 mL 2  . lactulose (CHRONULAC) 10 GM/15ML solution Take 30 mLs (20 g total) by mouth 2 (two) times  daily as needed for mild constipation. 240 mL 0  . levofloxacin (LEVAQUIN) 500 MG tablet Take 1 tablet (500 mg total) by mouth daily.    . Mometasone Furo-Formoterol Fum (DULERA IN) Inhale 1 puff into the lungs 2 (two) times daily.    . montelukast (SINGULAIR) 10 MG tablet Take 10 mg by mouth at bedtime.    Marland Kitchen oxyCODONE (OXY IR/ROXICODONE) 5 MG immediate release tablet Take 1 tablet (5 mg total) by mouth every 4 (four) hours as needed for severe pain. 10 tablet 0  . oxyCODONE (OXYCONTIN) 10 mg 12 hr tablet Take 1 tablet (10 mg total) by mouth every 12 (twelve) hours. 15 tablet 0  . pantoprazole (PROTONIX) 40 MG tablet Take 1 tablet (40 mg total) by mouth daily at 12 noon.    . predniSONE (DELTASONE) 20 MG tablet 20 mg x 2 days, then 10 mg x 2 days then d/c    . pregabalin (LYRICA) 75 MG capsule Take 1 capsule (75 mg total) by mouth 2 (two) times daily. Reported on 08/06/2015 60 capsule 1  . QUEtiapine (SEROQUEL) 100 MG tablet Take 1 tablet (100 mg total) by mouth 2 (two) times daily.    Marland Kitchen zolpidem (AMBIEN) 10 MG tablet Take 10 mg by mouth at bedtime as needed for sleep.   0   No current facility-administered medications for this visit.    Functional Status:  In your present state of health, do you have any difficulty performing the  following activities: 09/09/2015 08/22/2015  Hearing? N N  Vision? N N  Difficulty concentrating or making decisions? Y N  Walking or climbing stairs? Y Y  Dressing or bathing? Y Y  Doing errands, shopping? Y N  Preparing Food and eating ? N -  Using the Toilet? N -  In the past six months, have you accidently leaked urine? N -  Do you have problems with loss of bowel control? N -  Managing your Medications? Y -  Managing your Finances? Y -  Housekeeping or managing your Housekeeping? Y -    Fall/Depression Screening:  PHQ 2/9 Scores 09/09/2015 08/06/2015 06/12/2015 05/08/2015  PHQ - 2 Score 3 0 0 1  PHQ- 9 Score 9 - - -    Assessment:   CSW was able to  make contact with patient today to perform the initial assessment, as well as assess and assist with social work needs and services.  CSW met with patient at [facility] [place] where patient currently resides to receive short-term rehabilitative services.  CSW introduced self, explained role and types of services provided through [facility] ([facility]).  CSW further explained to patient that CSW works with patient's RNCM, also with [facility], Erenest Rasher. CSW then explained the reason for the call, indicating that Mrs. Ileene Patrick thought that patient would benefit from social work services and resources to assist with possible discharge planning needs and services.  CSW obtained two HIPAA compliant identifiers from patient, which included patient's name and date of birth. Patient appeared to be groggy and heavily medicated at the time of CSW's arrival.  Patient was sitting up in bed but continued to slide from side-to-side, readjusting herself often.  Patient's speech was slurred and CSW noted that patient lost her train of thought on several different occasions, needing to be redirected.  Patient requested that CSW assist her with obtaining her pain medications upon discharge from [facility].  CSW agreed to assist patient with arranging an appointment with the Pain Management Clinic to ensure that patient is seen by a pain management physician regarding appropriate pain medications.  Patient also requested that CSW assist her with getting a hospital bed for home use.  CSW agreed to speak with patient's therapists (both physical and occupational) at [facility] to see if patient qualifies for a hospital bed.  In the meantime, CSW provided patient with a list of home health agencies that offer home health services, as well as durable medical equipment, encouraging patient to decide on an agency of choice. After thorough review of patient's EMR  (Electronic Medical Record) In EPIC, CSW noted that patient has a long history of Polysubstance Abuse, Substance Abuse, Tobacco Abuse, Alcohol Abuse, Cocaine Abuse, Generalized Anxiety Disorder, Panic Attacks, Depression, Chronic Pain Syndrome and Adjustment Disorder with Anxious Mood, just to name a few.  CSW was able to ensure that patient is not currently experiencing symptoms of anxiety and/or depression, nor is patient feeling homicidal or suicidal.  CSW will encourage patient to engage with a psychiatrist for medication management and therapist for counseling and supportive services, providing available resources.   Plan:   CSW will attend patient's Discharge Planning Meeting at [facility] [place] where patient currently resides to receive short-term rehabilitative services, scheduled for Wednesday, September 23, 2015 at 11:00am. CSW will fax a barriers letter, as well as a correspondence letter to patient's Primary Care Physician, Dr. Arnoldo Morale to ensure that Dr.  Amao is aware of CSW's involvement with patient's care.   CSW will converse with Erenest Rasher, RNCM with [facility], to report findings of initial visit with patient today. CSW will prescribe and print EMMI information for patient to review at the next scheduled visit.  Nat Christen, BSW, MSW, LCSW  Licensed Education officer, environmental Health System  Mailing [place] [address], [place], [place] [address] Physical Address-[address], [place], [place] [address] Toll Free Main # [phone] Fax # [phone] Cell # [phone]  Fax # [phone]  [email or web address]   [place] complies with [employer] civil rights laws and does not discriminate on the basis of race, color, national origin, age, disability, or sex.  Espaol (Spanish)  [place] cumple con las leyes federales  de derechos civiles aplicables y no discrimina por motivos de raza, color, nacionalidad, edad, discapacidad o sexo.     Ti?ng Vi?t ([place])  [place] tun th? lu?t dn quy?n hi?n hnh c?a Lin bang v khng phn bi?t ?i x? d?a trn ch?ng t?c, mu da, ngu?n g?c qu?c gia, ? tu?i, khuy?t t?t, ho?c gi?i tnh.     (Arabic)

## 2015-09-09 NOTE — Telephone Encounter (Signed)
Attempted to contact the patient to inform her about the referral to [facility] for pain management. Call placed to # [phone] and the message noted that the person that is being called is unavailable now. No option to leave a message

## 2015-09-11 ENCOUNTER — Telehealth: Payer: Self-pay

## 2015-09-11 NOTE — Telephone Encounter (Signed)
This Case Manager placed call to patient at #[phone] to inform her of the referral to [facility] [phone]) for pain management as patient spoke with Robyne Peers, RN CM on 09/08/15 inquiring about status of her pain management referral. Unable to reach patient or leave a voicemail requesting return call.

## 2015-09-22 ENCOUNTER — Other Ambulatory Visit: Payer: Self-pay | Admitting: *Deleted

## 2015-09-22 ENCOUNTER — Telehealth: Payer: Self-pay

## 2015-09-22 NOTE — Telephone Encounter (Signed)
Call received from the patient informing this CM that she was being discharged from [facility] this afternoon. She said that she was concerned about getting her medications because the facility was not giving her any medications when she leaves.  She was crying and upset that her appointment at the [facility] with Dr Venetia Night is not until 09/30/15. She said that she would need her pain medication and needs to be seen sooner. This CM explained to her that the referral for pain management was sent to [facility] and she needed to call the clinic to check on the status of the referral.  Explained to her that this CM has been trying with out success to reach her to provide her the number for the pain clinic.  This CM requested to speak to the SW or CM at the facility. The patient said that the SW was with her and then handed the phone to the SW - Sande Brothers.  Ms Andrey Campanile confirmed that the patient was being discharged today and that an appointment was made for the patient at the [facility] but the patient was concerned about having to wait to see her MD to have her pain evaluated and to be referred to the pain clinic. This CM explained that the referral was made to [facility] and the patient needs to call to check on the status of the referral. This CM provided the contact # for [facility] [phone].  This CM also offered to check to see if an earlier appointment could be scheduled and would call Ms Andrey Campanile back. The patient also provided this CM with her best contact # [phone]. Ms Andrey Campanile noted that they have been working with Laurance Flatten, MSW with [facility].  This CM placed a call to Laurance Flatten, MSW # [phone] and left a voice mail message requesting a call back to # [phone].  After checking with the [facility] scheduler, this CM then called Ms Andrey Campanile at [facility] # [phone] and informed her that there was not an earlier appointment for the patient but she can call daily to see if there are any  cancellations.  Ms Andrey Campanile said that the patient called Toma Copier about the pain clinic appointment but she (Ms Andrey Campanile)  did not have any information about the result of the call.  She said that the patient has been discharged  and the patient's friend, Olegario Messier has taken on the role of the patient's caregiver. Ms Andrey Campanile stated that the patient has prescriptions for her medications and was given a 2-3 day supply of pain medication ( only what was scheduled, no prn medications). She was not sure which pain medications were provided. She said that medically the patient is doing  " fine."

## 2015-09-22 NOTE — Patient Outreach (Signed)
[facility]) Care Management  09/22/2015  Julie Kerr July 31, 1976 [id number]   CSW received a call from patient today, crying hysterically, reporting that she was being discharged from [facility] where patient was residing to receive short-term rehabilitative services, without any of her pain medications.  Patient inquired, "I want to know what you plan to do about it because I can't go home without my medicines".  CSW was able to look in patient's EMR (Electronic Medical Record) in EPIC to learn that patient has an appointment scheduled with her Primary Care Physician, [name] on Wednesday, September 30, 2015.  CSW reminded patient of this post hospital follow-up appointment.  Patient reported, "I can't wait that long, I have nothing at home and they say they can't prescribe me anything when I leave here".  CSW agreed to contact Dr. Jen Mow office to see about scheduling patient an appointment sooner than the 15th.  Patient reported, "I've already tried that, they said they don't have anything".  Patient would not allow CSW permission to contact Dr. Jen Mow office on her behalf to try and discuss patient's concerns.  Patient admitted that she was under the impression that CSW would be able to prescribe pain medications for her until she is able to be seen at the Pain Management Clinic.  CSW reminded patient that this conversation has already taken place, during CSW's initial assessment with patient, at which time CSW thoroughly explained the types of services that CSW is able to provide through [facility].  Patient became very angry with CSW, terminating the call. CSW then contacted [name], Admissions Coordinator/Licensed Clinical Social Worker at [facility] to discuss patient's current plan of care and discharge arrangements. [name] admits that patient was discharged from [facility] at 11:00am this morning, but refuses to leave  the facility without pain medications "in hand".  [name] has since contacted patient's family to pick her up from [place] to transport her home.  [name] indicated that there is nothing more that they can do for patient, despite all of patient's many requests.  [name] went on to say that patient requested that [facility] purchase her a new bed for home use, even though she does not qualify for a hospital bed; contact patient's sons' school to lie about him living with her, instead of his father, patient's ex-husband, so that patient can collect her son's Social Security check; assist patient with completion of a [employer] Midwife) application for transportation purposes, even though patient has the application and is perfectly capable of completing independently, etc.  [name] reported that patient was very demanding toward staff, as well as argumentative when she did not get her way.  [name] reported to CSW, "Realistically, I don't know what you will be able to do for her from a social work standpoint".  CSW agreed to follow along to assess and assist with possible social work needs and services.  CSW will notify patient's RNCM with [facility], Emilia Beck of patient's plans for discharge from [facility] today.  Danford Bad, BSW, MSW, LCSW  Licensed Restaurant manager, fast food Health System  Mailing [place] [address] [place], [place] [address] Physical Address-[address], [place], [place] [address] Toll Free Main # [phone] Fax # [phone] Cell # [phone]  Fax # [phone]  [email or web address]    [facility] complies with [employer] civil rights laws and  does not discriminate on the basis of race, color, national origin, age, disability, or sex.  Espaol (Spanish)  [facility] Customer service manager cumple con las leyes federales de derechos  civiles aplicables y no discrimina por motivos de raza, color, nacionalidad, edad, discapacidad o sexo.     Ti[place]ng Vi?t ([place]  [facility] Art gallery manager th? lu?t dn quy?n hi?n hnh c?a Lin bang v khng phn bi?t ?i x? d?a trn ch?ng t?c, mu da, ngu?n g?c qu?c gia, ? tu?i, khuy?t t?t, ho?c gi?i tnh.     (Arabic)    [facility] Customer service manager

## 2015-09-23 ENCOUNTER — Emergency Department (HOSPITAL_COMMUNITY)
Admission: EM | Admit: 2015-09-23 | Discharge: 2015-09-24 | Disposition: A | Discharge status: Home / Self Care | Payer: Commercial Managed Care - HMO | Source: Home / Self Care | Attending: Emergency Medicine | Admitting: Emergency Medicine

## 2015-09-23 ENCOUNTER — Telehealth: Payer: Self-pay

## 2015-09-23 ENCOUNTER — Ambulatory Visit: Payer: Self-pay | Admitting: *Deleted

## 2015-09-23 ENCOUNTER — Other Ambulatory Visit: Payer: Self-pay | Admitting: *Deleted

## 2015-09-23 ENCOUNTER — Encounter (HOSPITAL_COMMUNITY): Payer: Self-pay | Admitting: *Deleted

## 2015-09-23 ENCOUNTER — Emergency Department (HOSPITAL_COMMUNITY): Payer: Commercial Managed Care - HMO

## 2015-09-23 DIAGNOSIS — F419 Anxiety disorder, unspecified: Secondary | ICD-10-CM | POA: Insufficient documentation

## 2015-09-23 DIAGNOSIS — I10 Essential (primary) hypertension: Secondary | ICD-10-CM | POA: Insufficient documentation

## 2015-09-23 DIAGNOSIS — G8929 Other chronic pain: Secondary | ICD-10-CM

## 2015-09-23 DIAGNOSIS — Z87891 Personal history of nicotine dependence: Secondary | ICD-10-CM | POA: Insufficient documentation

## 2015-09-23 DIAGNOSIS — R011 Cardiac murmur, unspecified: Secondary | ICD-10-CM | POA: Insufficient documentation

## 2015-09-23 DIAGNOSIS — R000 Tachycardia, unspecified: Secondary | ICD-10-CM | POA: Insufficient documentation

## 2015-09-23 DIAGNOSIS — R069 Unspecified abnormalities of breathing: Secondary | ICD-10-CM | POA: Diagnosis not present

## 2015-09-23 DIAGNOSIS — R0602 Shortness of breath: Secondary | ICD-10-CM

## 2015-09-23 DIAGNOSIS — Z79899 Other long term (current) drug therapy: Secondary | ICD-10-CM | POA: Insufficient documentation

## 2015-09-23 DIAGNOSIS — J45901 Unspecified asthma with (acute) exacerbation: Secondary | ICD-10-CM | POA: Insufficient documentation

## 2015-09-23 DIAGNOSIS — R05 Cough: Secondary | ICD-10-CM | POA: Diagnosis present

## 2015-09-23 DIAGNOSIS — Z792 Long term (current) use of antibiotics: Secondary | ICD-10-CM | POA: Insufficient documentation

## 2015-09-23 DIAGNOSIS — Z7951 Long term (current) use of inhaled steroids: Secondary | ICD-10-CM | POA: Insufficient documentation

## 2015-09-23 DIAGNOSIS — J441 Chronic obstructive pulmonary disease with (acute) exacerbation: Secondary | ICD-10-CM | POA: Diagnosis not present

## 2015-09-23 LAB — I-STAT ARTERIAL BLOOD GAS, ED
BICARBONATE: 25.2 meq/L — AB (ref 20.0–24.0)
O2 SAT: 97 %
PH ART: 7.402 (ref 7.350–7.450)
TCO2: 26 mmol/L (ref 0–100)
pCO2 arterial: 40.4 mmHg (ref 35.0–45.0)
pO2, Arterial: 86 mmHg (ref 80.0–100.0)

## 2015-09-23 LAB — CBC
HEMATOCRIT: 32.6 % — AB (ref 36.0–46.0)
Hemoglobin: 9.7 g/dL — ABNORMAL LOW (ref 12.0–15.0)
MCH: 23 pg — ABNORMAL LOW (ref 26.0–34.0)
MCHC: 29.8 g/dL — ABNORMAL LOW (ref 30.0–36.0)
MCV: 77.3 fL — AB (ref 78.0–100.0)
PLATELETS: 434 10*3/uL — AB (ref 150–400)
RBC: 4.22 MIL/uL (ref 3.87–5.11)
RDW: 19.2 % — ABNORMAL HIGH (ref 11.5–15.5)
WBC: 9 10*3/uL (ref 4.0–10.5)

## 2015-09-23 LAB — COMPREHENSIVE METABOLIC PANEL
ALBUMIN: 3.4 g/dL — AB (ref 3.5–5.0)
ALT: 29 U/L (ref 14–54)
AST: 23 U/L (ref 15–41)
Alkaline Phosphatase: 66 U/L (ref 38–126)
Anion gap: 9 (ref 5–15)
BILIRUBIN TOTAL: 0.6 mg/dL (ref 0.3–1.2)
CHLORIDE: 104 mmol/L (ref 101–111)
CO2: 26 mmol/L (ref 22–32)
CREATININE: 0.74 mg/dL (ref 0.44–1.00)
Calcium: 9.5 mg/dL (ref 8.9–10.3)
GFR calc Af Amer: >60 mL/min (ref >60)
GLUCOSE: 111 mg/dL — AB (ref 65–99)
Potassium: 4 mmol/L (ref 3.5–5.1)
Sodium: 139 mmol/L (ref 135–145)
Total Protein: 7 g/dL (ref 6.5–8.1)

## 2015-09-23 MED ORDER — IPRATROPIUM BROMIDE 0.02 % IN SOLN
0.5000 mg | Freq: Once | RESPIRATORY_TRACT | Status: AC
Start: 2015-09-23 — End: 2015-09-23
  Administered 2015-09-23: 0.5 mg via RESPIRATORY_TRACT
  Filled 2015-09-23: qty 2.5

## 2015-09-23 MED ORDER — FENTANYL CITRATE (PF) 100 MCG/2ML IJ SOLN
50.0000 ug | Freq: Once | INTRAMUSCULAR | Status: AC
  Administered 2015-09-23: 50 ug via INTRAVENOUS
  Filled 2015-09-23: qty 2

## 2015-09-23 MED ORDER — OXYCODONE-ACETAMINOPHEN 5-325 MG PO TABS
1.0000 | ORAL_TABLET | Freq: Once | ORAL | Status: AC
  Administered 2015-09-23: 1 via ORAL
  Filled 2015-09-23: qty 1

## 2015-09-23 MED ORDER — SODIUM CHLORIDE 0.9 % IV BOLUS (SEPSIS)
1000.0000 mL | Freq: Once | INTRAVENOUS | Status: AC
  Administered 2015-09-23: 1000 mL via INTRAVENOUS

## 2015-09-23 MED ORDER — ALBUTEROL (5 MG/ML) CONTINUOUS INHALATION SOLN
20.0000 mg/h | INHALATION_SOLUTION | RESPIRATORY_TRACT | Status: AC
  Administered 2015-09-23: 20 mg/h via RESPIRATORY_TRACT
  Filled 2015-09-23: qty 20

## 2015-09-23 NOTE — Patient Outreach (Signed)
[facility]) Care Management  09/23/2015  Julie Kerr [birth year] [id number]   CSW made an attempt to try and contact patient this morning to follow-up with her after her discharge from [facility], [facility] Facility where patient was residing to receive short-term rehabilitative services; however, patient was unavailable.  A HIPAA compliant message was left for patient on voicemail.  CSW is currently awaiting a return call.  CSW was able to converse with Julie Kerr, Nurse Case Manager with the [facility], after a voicemail message was received from Mrs. Julie Kerr on Tuesday, September 22, 2015.  CSW and Mrs. Julie Kerr were able to discuss patient's follow-up appointment with her Primary Care Physician, Dr. Jaclyn Kerr, scheduled for Wednesday, September 30, 2015.  Patient's appointment with the [facility] Pain Management Clinic was also discussed.  Mrs. Julie Kerr reported that patient was charged with the task of contacting the pain management clinic to see about scheduling an appointment, as Mrs. Julie Kerr made several attempts to try and contact patient while at [facility] to schedule the appointment, without success.  CSW will continue to try and make contact with patient to assess and assist with possible social work needs and services, as much as patient will allow.   CSW will converse with Julie Kerr, RNCM with [facility], to report findings of phone conversation with Mrs. Julie Kerr today. CSW will fax a correspondence letter to patient's Primary Care Physician, Dr. Jaclyn Kerr to ensure that Dr. Venetia Kerr is aware of CSW's involvement with patient's care.   Danford Bad, BSW, MSW, LCSW  Licensed Restaurant manager, fast food Health System  Mailing [place] [address] [place], [place] [address] Physical Address-[address], [place], [place] [address] Toll Free Main #  [phone] Fax # [phone] Cell # [phone]  Fax # [phone]  [email or web address]    [facility] [employer] complies with [employer] civil rights laws and does not discriminate on the basis of race, color, national origin, age, disability, or sex.  Espaol (Spanish)  [facility] Customer service manager cumple con las leyes federales de derechos civiles aplicables y no discrimina por motivos de raza, color, nacionalidad, edad, discapacidad o sexo.     Ti[place] Vi?t ([place]  [facility] Art gallery manager th? lu?t dn quy?n hi?n hnh c?a Lin bang v khng phn bi?t ?i x? d?a trn ch?ng t?c, mu da, ngu?n g?c qu?c gia, ? tu?i, khuy?t t?t, ho?c gi?i tnh.     (Arabic)    [facility] Psych[facility], clinical is Against the [employer]. and its subsidiaries comply with [employer] civil rights laws and do not discriminate on the basis of race, color, national origin, age, disability, or sex. [employer]. and its subsidiaries do not exclude people or treat them differently because of race, color, national origin, age, disability, or sex.    [employer]. and its subsidiaries provide:  . Free auxiliary aids and services, such as qualified sign language interpreters, video remote interpretation, and written information in other formats to people with disabilities when such auxiliary aids and services are necessary to ensure an equal opportunity to participate. . Free language services to people whose primary language is not English when those services are necessary to provide meaningful access, such as translated documents or oral interpretation.  If you need these services, call [phone] or if you use a TTY, call 711.   If you believe that [employer]. and its subsidiaries have failed to provide these services  or discriminated in another way on the basis of race, color, national origin, age, disability, or sex, you can file a Theatre manager with:   Discrimination Grievances  [address] [address]  [place], [place] [address]   If you need help filing a grievance, call [phone] or if you use a TTY, call 711.  You can also file a civil rights complaint with the U.S. Department of Health and Health and safety inspector, Office for Civil Rights electronically through the Office for Civil Rights Complaint Portal, available at [email or web address]-ellison.[email or web address], or by mail or phone at:   U.S. Department of Health and Human Services  [address], [place]  Room [phone], [facility] Building  [place], [place]. [address]  [phone], [phone] (TDD)  Complaint forms are available at [email or web address] Multi-Language Interpreter Services  English: ATTENTION: If you do not speak English, language assistance services, free of charge, are available to you. Call [phone] (TTY: 711).  Espaol (Spanish): ATENCIN: si habla espaol, tiene a su disposicin servicios gratuitos de asistencia lingstica. Llame al [phone] (TTY: 711). ???? (Chinese): ?????????????????????????????? [phone]?TTY: 711??  Ti?ng Vi?t (Vietnamese): CH : N?u b?n ni Ti?ng Vi?t, c cc d?ch v? h? tr? ngn ng? mi?n ph dnh cho b?n. G?i s? [phone] (TTY: 711).  ??? ([place]): ?? : ???? ????? ?? , ?? ?? ???? ??? ???? ? ???? . [phone] (TTY: 711)??? ??? ???? .  Tagalog (Tagalog - Filipino): PAUNAWA: Kung nagsasalita ka ng Tagalog, maaari kang gumamit ng mga serbisyo ng tulong sa wika nang walang bayad. [place]ag sa [phone] (TTY: 711).   [place]): :      ,      .  [phone] (: 711).  Kreyl Ayisyen ([place]): ATANSYON: Si w pale Abe People, gen svis d pou lang ki disponib  gratis pou ou. Rele [phone] (TTY: 711).  Cherylann Banas Marland KitchenJamaica): ATTENTION : Si vous parlez franais, des services d'aide linguistique vous sont proposs gratuitement. Appelez le [phone] (ATS : 711).  Polski (Polish): UWAGA: Jeeli mwisz po polsku, moesz skorzysta z bezpatnej pomocy jzykowej. Zadzwo pod numer [phone] (TTY: 711).  Portugus ([place]): ATENO: Se fala portugus, encontram-se disponveis servios lingusticos, grtis. Ligue para [phone] (TTY: 711).  Italiano ([place]): ATTENZIONE: In caso la lingua parlata sia l'italiano, sono disponibili servizi di assistenza linguistica gratuiti. Chiamare il numero [phone] (TTY: 711).  Tobie Lords ([place]): ACHTUNG: Wenn Sie Deutsch sprechen,  stehen Ihnen kostenlos sprachliche Hilfsdienstleistungen zur [employer]. Rufnummer: [phone] (TTY: 711).   (Arabic): [phone]   .            : .)711 :   (  ??? (Japanese): ??????????????????????????????????[phone] ?TTY?711?????????????????  ? (Farsi): [phone]  . ?   ? ?  ? ? ?~ ?  ?    : .??  (TTY: 711)  Din Bizaad ([place]): D77 baa ak0 n7n7zin: D77 saad bee y1n7[ti'go Jodell C[employer], saad bee 1k1'1n7da'1wo'd66', t'11 Donnetta Hail n1 h0l=, koj8' h0d77lnih [phone] (TTY:   711).

## 2015-09-23 NOTE — ED Provider Notes (Signed)
CSN: [id number]     Arrival date & time 09/23/15  1540 History   First MD Initiated Contact with Patient 09/23/15 1549     Chief Complaint  Patient presents with  . Shortness of Breath     (Consider location/radiation/quality/duration/timing/severity/associated sxs/prior Treatment) Patient is a 40 y.o. female presenting with shortness of breath. The history is provided by the patient and the EMS personnel.  Shortness of Breath Severity:  Unable to specify Onset quality:  Sudden Duration:  1 day Timing:  Constant Progression:  Unchanged Chronicity:  Chronic Context: not URI   Relieved by:  None tried Worsened by:  Nothing tried Ineffective treatments:  None tried Associated symptoms: cough and wheezing   Associated symptoms: no fever, no headaches, no rash and no vomiting   Associated symptoms comment:  Reports pain all over   Past Medical History  Diagnosis Date  . Asthma   . Anxiety   . Bronchitis   . Heart murmur   . Shortness of breath   . Hypertension   . Vocal cord dysfunction   . Chronic narcotic dependence [facility])    Past Surgical History  Procedure Laterality Date  . Spinal fusion  11/2012    Performed in [facility] by Dr. Mayford Knife  . Tracheostomy  December 2015   Family History  Problem Relation Age of Onset  . HIV Mother   . Heart disease Father   . CVA Father   . Heart disease Other   . Emphysema Maternal Grandmother     smoked  . Asthma Sister   . Clotting disorder Sister   . Clotting disorder Maternal Grandmother   . Lung cancer Maternal Grandmother     smoked   Social History  Substance Use Topics  . Smoking status: Former Smoker -- 2.00 packs/day for 17 years    Types: Cigarettes    Quit date: 06/15/2014  . Smokeless tobacco: Never Used  . Alcohol Use: 0.0 oz/week    0 Standard drinks or equivalent per week     Comment: occasional   OB History    No data available     Review of Systems  Constitutional: Negative for fever and  chills.  HENT: Negative.   Eyes: Negative for visual disturbance.  Respiratory: Positive for cough, shortness of breath and wheezing.   Cardiovascular: Negative for leg swelling.  Gastrointestinal: Negative for nausea, vomiting and diarrhea.  Genitourinary: Negative.   Musculoskeletal: Negative.   Skin: Negative for pallor, rash and wound.  Neurological: Negative for syncope and headaches.      Allergies  Robitussin dm; Chocolate; Suboxone; Nsaids; Rayon, purified; and Tramadol  Home Medications   Prior to Admission medications   Medication Sig Start Date End Date Taking? Authorizing Provider  ALPRAZolam Prudy Feeler) 0.5 MG tablet Take 1 tablet (0.5 mg total) by mouth 3 (three) times daily. 09/01/15  Yes Jessica U Vann, DO  arformoterol (BROVANA) 15 MCG/2ML NEBU Take 2 mLs (15 mcg total) by nebulization 2 (two) times daily. 07/29/15  Yes Vassie Loll, MD  benzonatate (TESSALON) 200 MG capsule Take 1 capsule (200 mg total) by mouth 3 (three) times daily as needed for cough. 09/01/15  Yes Jessica U Vann, DO  budesonide (PULMICORT) 0.5 MG/2ML nebulizer solution Take 2 mLs (0.5 mg total) by nebulization 2 (two) times daily. 07/29/15  Yes Vassie Loll, MD  ENSURE PLUS (ENSURE PLUS) LIQD Take 237 mLs by mouth 3 (three) times daily between meals.   Yes Historical Provider, MD  FLUoxetine (PROZAC)  40 MG capsule Take 1 capsule (40 mg total) by mouth at bedtime. Patient taking differently: Take 40 mg by mouth at bedtime as needed (sleep).  08/06/15  Yes Jaclyn Shaggy, MD  fluticasone (FLONASE) 50 MCG/ACT nasal spray Place 2 sprays into both nostrils 2 (two) times daily. Patient taking differently: Place 2 sprays into both nostrils daily.  04/30/15  Yes Ripudeep K Rai, MD  ipratropium-albuterol (DUONEB) 0.5-2.5 (3) MG/3ML SOLN Take 3 mLs by nebulization every 4 (four) hours as needed (shortness of breath/wheezing). 07/29/15  Yes Vassie Loll, MD  lactulose (CHRONULAC) 10 GM/15ML solution Take 30 mLs  (20 g total) by mouth 2 (two) times daily as needed for mild constipation. 09/01/15  Yes Joseph Art, DO  levofloxacin (LEVAQUIN) 500 MG tablet Take 1 tablet (500 mg total) by mouth daily. 09/01/15  Yes Jessica U Vann, DO  mometasone-formoterol (DULERA) 100-5 MCG/ACT AERO Inhale 2 puffs into the lungs 2 (two) times daily.   Yes Historical Provider, MD  montelukast (SINGULAIR) 10 MG tablet Take 10 mg by mouth at bedtime.   Yes Historical Provider, MD  oxyCODONE (OXY IR/ROXICODONE) 5 MG immediate release tablet Take 1 tablet (5 mg total) by mouth every 4 (four) hours as needed for severe pain. 09/01/15  Yes Joseph Art, DO  oxyCODONE (OXYCONTIN) 10 mg 12 hr tablet Take 1 tablet (10 mg total) by mouth every 12 (twelve) hours. 09/01/15  Yes Jessica U Vann, DO  pantoprazole (PROTONIX) 40 MG tablet Take 1 tablet (40 mg total) by mouth daily at 12 noon. 09/01/15  Yes Joseph Art, DO  pregabalin (LYRICA) 75 MG capsule Take 1 capsule (75 mg total) by mouth 2 (two) times daily. Reported on 08/06/2015 08/06/15  Yes Jaclyn Shaggy, MD  QUEtiapine (SEROQUEL) 100 MG tablet Take 1 tablet (100 mg total) by mouth 2 (two) times daily. 09/01/15  Yes Joseph Art, DO  zolpidem (AMBIEN) 10 MG tablet Take 10 mg by mouth at bedtime as needed for sleep.  04/30/15  Yes Historical Provider, MD  guaiFENesin (MUCINEX) 600 MG 12 hr tablet Take 1 tablet (600 mg total) by mouth 2 (two) times daily. Patient not taking: Reported on 09/23/2015 07/29/15   Vassie Loll, MD  Mometasone Furo-Formoterol Fum (DULERA IN) Inhale 1 puff into the lungs 2 (two) times daily.    Historical Provider, MD  predniSONE (DELTASONE) 20 MG tablet 20 mg x 2 days, then 10 mg x 2 days then d/c Patient not taking: Reported on 09/23/2015 09/01/15   Shanda Bumps U Vann, DO   BP 128/74 mmHg  Pulse 109  Temp(Src) 99 F (37.2 C) (Oral)  Resp 20  Ht  (1.626 m)  Wt 106.142 kg  BMI 40.15 kg/m2  SpO2 98%  LMP 09/16/2015 Physical Exam  Constitutional: She is  oriented to person, place, and time. No distress.  Chronically ill appearing female. Nasal canula in place  HENT:  Head: Normocephalic and atraumatic.  Eyes: EOM are normal. Pupils are equal, round, and reactive to light.  Neck: Normal range of motion. Neck supple.  Cardiovascular: Regular rhythm, normal heart sounds and intact distal pulses.   Mildly tachycardic  Pulmonary/Chest: She has wheezes. She has rhonchi.  Abdominal: Soft. She exhibits no distension. There is no tenderness. There is no rebound and no guarding.  Musculoskeletal: Normal range of motion. She exhibits no edema or tenderness.  Neurological: She is alert and oriented to person, place, and time. No cranial nerve deficit. She exhibits normal muscle tone.  Coordination normal.  Skin: Skin is warm and dry. No rash noted. She is not diaphoretic. No erythema. No pallor.  Nursing note and vitals reviewed.   ED Course  Procedures (including critical care time) Labs Review Labs Reviewed  CBC - Abnormal; Notable for the following:    Hemoglobin 9.7 (*)    HCT 32.6 (*)    MCV 77.3 (*)    MCH 23.0 (*)    MCHC 29.8 (*)    RDW 19.2 (*)    Platelets 434 (*)    All other components within normal limits  COMPREHENSIVE METABOLIC PANEL - Abnormal; Notable for the following:    Glucose, Bld 111 (*)    BUN <5 (*)    Albumin 3.4 (*)    All other components within normal limits  I-STAT ARTERIAL BLOOD GAS, ED - Abnormal; Notable for the following:    Bicarbonate 25.2 (*)    All other components within normal limits  BLOOD GAS, ARTERIAL    Imaging Review Dg Chest Port 1 View  09/23/2015  CLINICAL DATA:  Mid chest pain and shortness of breath EXAM: PORTABLE CHEST 1 VIEW COMPARISON:  08/26/2015 FINDINGS: The heart size and mediastinal contours are within normal limits. Both lungs are clear. The visualized skeletal structures are unremarkable. IMPRESSION: No active disease. Electronically Signed   By: Elige Ko   On: 09/23/2015  17:25   I have personally reviewed and evaluated these images and lab results as part of my medical decision-making.   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 She was recently discharged from her rehabilitation facility has not been compliant with her home physical therapy and occupational therapy. During transport patient given breathing treatments and Solu-Medrol. Further history and exam as above notable for slight tachycardia but diffuse wheezing and rhonchi. Patient given breathing treatments and IV fluids here. ABG reassuring CBC and CMP are reassuring. cxr without obvious pneumonia. EKG without acute ischemic changes or arrhythmias. Patient is back at her baseline O2. Patient able to eat and drink without acute distress. Patient able to maneuver around her bed with very little assistance and able tos stand without drop in her O2 sats. Case manager was involved who states that she has appointment with home health and PT tomorrow.   I have reviewed all labs, ekg, and imagin. Patient stable for discharge home.  I have reviewed all results with the patient. Advised to cont working with home health and PT as advised and maintain close f/u with her PCP. Patient agrees to stated plan. All questions answered.    Marijean Niemann, MD 09/24/15 [id number]  Alvira Monday, MD 09/25/15 [id number]

## 2015-09-23 NOTE — ED Notes (Signed)
RN and EMT attempted to walk pt; Pt again gave little assistance in trying to stand while holding on to walker; Pt states she had a roommate before going to Rehab but roommate is no longer there; Pt acted as if she was going to fall over; RN and EMT placed pt back on bed; pt sats remained at 100% on 2L O2 while attempting to ambulate; RN spoke with Case manager who informed RN that pt has appointment for home health and PT to come out in the AM; Home health and PT tried to see pt today but pt was in Ed already; Pt has been informed of this information and impending discharge.

## 2015-09-23 NOTE — Care Management (Addendum)
ED CM noted patient was discharged from [facility] with [facility]) [facility] services yesterday. Spoke with French Ana the on call nurse at [facility], nurse will contact patient in the am to set up another visit Patient has has supportive care from friend Olegario Messier 24 hour caregiver.  Patient was discharged from [facility] without  prescriptions for pain meds   Patient has a follow up appt with [facility] [name] next Wednesday 2/15.  ED CM noted A referral has been placed  for [facility], but it appears as if patient was recently discharged from a pain management clinic. She is also active with [facility] community care management program, CM will update RNCM for [facility]   CM updated  Monique RN on Pod D. No further ED CM needs identified.

## 2015-09-23 NOTE — ED Notes (Signed)
Md notified about wheezing and pt being uncomfortable and pain;

## 2015-09-23 NOTE — ED Notes (Addendum)
MD aware no IV access, IV infiltrated.  1L unable to finish infusing.  MD aware.  No new orders at this time

## 2015-09-23 NOTE — ED Notes (Signed)
attempted to call md

## 2015-09-23 NOTE — ED Notes (Signed)
Pt presents via [facility] from home c/o shortness or breath and lower back pain after getting stuck in the bathtub for approximately 2 hours without oxygen.  Pt hx: COPD, anxiety, trach.  Pt d/c yesterday from rehab facility.  2L O2 at home.  Lungs wheezy and rales.  5 albuterol and 125 Solumedrol given by EMS.  EKG SR-96 O2-100% 4L, BP-140/84 R-24. Pt a x 4, NAD on arrival, tearful.  Reports not taking xanex today, prescribed 3x day.   Airway intact at this time.

## 2015-09-23 NOTE — ED Notes (Signed)
Pt requesting something for pain, MD aware. 

## 2015-09-23 NOTE — ED Notes (Signed)
O2 decreased to 3L-100%

## 2015-09-23 NOTE — ED Notes (Signed)
ekg given to Dr. Radford Pax and reviewed case, patient arrived for shortness of breath, already received breathing treatments and continuous neb, fentanyl given for chest pain, qt time now at 598. o2 sat 97% on 3L. MD ackowledges, no new orders.

## 2015-09-23 NOTE — Telephone Encounter (Signed)
Call received Nat Christen, MSW with [facility].  She was returning this CM call.  She noted that she met with the patient early on at her stay at [facility] but the patient has not been receptive to her services.  She indicated that the  patient has been focused on obtaining pain medications. A plan of care has not been established yet at the patient was just discharged from the facility yesterday. Her ability to assist the patient will depend on the patient's willingness to accept the services that she can offer.

## 2015-09-23 NOTE — ED Notes (Signed)
Attempted to walk pt; pt ambulates with walker and gave RN little assist to get up on side of bed; Pt still presents with chronic wheezing; RN to get assitance to attempt to ambulate pt; Walker at bedside

## 2015-09-23 NOTE — Discharge Instructions (Signed)

## 2015-09-23 NOTE — ED Notes (Addendum)
Pt c/o SOB, CP and wheezing, MD to bedside to assess.  EKG complete, given to MD [facility]

## 2015-09-24 ENCOUNTER — Other Ambulatory Visit: Payer: Self-pay

## 2015-09-24 ENCOUNTER — Other Ambulatory Visit: Payer: Self-pay | Admitting: *Deleted

## 2015-09-24 ENCOUNTER — Encounter (HOSPITAL_COMMUNITY): Payer: Self-pay | Admitting: Emergency Medicine

## 2015-09-24 ENCOUNTER — Emergency Department (HOSPITAL_COMMUNITY)
Admission: EM | Admit: 2015-09-24 | Discharge: 2015-09-24 | Disposition: A | Discharge status: Home / Self Care | Payer: Commercial Managed Care - HMO | Attending: Emergency Medicine | Admitting: Emergency Medicine

## 2015-09-24 DIAGNOSIS — J441 Chronic obstructive pulmonary disease with (acute) exacerbation: Secondary | ICD-10-CM | POA: Diagnosis not present

## 2015-09-24 DIAGNOSIS — Z79899 Other long term (current) drug therapy: Secondary | ICD-10-CM | POA: Insufficient documentation

## 2015-09-24 DIAGNOSIS — R011 Cardiac murmur, unspecified: Secondary | ICD-10-CM | POA: Insufficient documentation

## 2015-09-24 DIAGNOSIS — I10 Essential (primary) hypertension: Secondary | ICD-10-CM | POA: Insufficient documentation

## 2015-09-24 DIAGNOSIS — F419 Anxiety disorder, unspecified: Secondary | ICD-10-CM | POA: Insufficient documentation

## 2015-09-24 DIAGNOSIS — Z7951 Long term (current) use of inhaled steroids: Secondary | ICD-10-CM | POA: Insufficient documentation

## 2015-09-24 DIAGNOSIS — Z87891 Personal history of nicotine dependence: Secondary | ICD-10-CM | POA: Insufficient documentation

## 2015-09-24 MED ORDER — ALBUTEROL (5 MG/ML) CONTINUOUS INHALATION SOLN
10.0000 mg | INHALATION_SOLUTION | Freq: Once | RESPIRATORY_TRACT | Status: AC
  Administered 2015-09-24: 10 mg via RESPIRATORY_TRACT
  Filled 2015-09-24: qty 20

## 2015-09-24 MED ORDER — IPRATROPIUM BROMIDE 0.02 % IN SOLN
0.5000 mg | Freq: Once | RESPIRATORY_TRACT | Status: AC
  Administered 2015-09-24: 0.5 mg via RESPIRATORY_TRACT
  Filled 2015-09-24: qty 2.5

## 2015-09-24 MED ORDER — ACETAMINOPHEN 325 MG PO TABS: 650.0000 mg | ORAL_TABLET | Freq: Once | ORAL | Status: DC

## 2015-09-24 NOTE — ED Provider Notes (Signed)
CSN: [id number]     Arrival date & time 09/24/15  0126 History  By signing my name below, I, Julie Kerr, attest that this documentation has been prepared under the direction and in the presence of [name], MD . Electronically Signed: Freida Kerr, Scribe. 09/24/2015. 3:10 AM.    Chief Complaint  Patient presents with  . Cough     The history is provided by the patient and medical records. No language interpreter was used.   HPI Comments:  Julie Kerr is a 40 y.o. female with a history of asthma, who presents to the Emergency Department complaining of continued cough and wheezing x 2 days. Pt was evaluated in the ED for the same and discharged home ~ 2 hours ago.  Upon reaching her home pt realized she  forgot her keys and EMS brought her back to ED. Pt reports acute CP at this time. Pt has no other acute complaints or symptoms at this time.   Past Medical History  Diagnosis Date  . Asthma   . Anxiety   . Bronchitis   . Heart murmur   . Shortness of breath   . Hypertension   . Vocal cord dysfunction   . Chronic narcotic dependence [facility])    Past Surgical History  Procedure Laterality Date  . Spinal fusion  11/2012    Performed in [facility] by Dr. Mayford Knife  . Tracheostomy  December 2015   Family History  Problem Relation Age of Onset  . HIV Mother   . Heart disease Father   . CVA Father   . Heart disease Other   . Emphysema Maternal Grandmother     smoked  . Asthma Sister   . Clotting disorder Sister   . Clotting disorder Maternal Grandmother   . Lung cancer Maternal Grandmother     smoked   Social History  Substance Use Topics  . Smoking status: Former Smoker -- 2.00 packs/day for 17 years    Types: Cigarettes    Quit date: 06/15/2014  . Smokeless tobacco: Never Used  . Alcohol Use: 0.0 oz/week    0 Standard drinks or equivalent per week     Comment: occasional   OB History    No data available     Review of Systems  Constitutional: Negative for  fever and chills.  Respiratory: Positive for cough and wheezing.   Cardiovascular: Positive for chest pain.  Gastrointestinal: Negative for nausea and vomiting.  All other systems reviewed and are negative.   Allergies  Robitussin dm; Chocolate; Suboxone; Nsaids; Rayon, purified; and Tramadol  Home Medications   Prior to Admission medications   Medication Sig Start Date End Date Taking? Authorizing Provider  ALPRAZolam Prudy Feeler) 0.5 MG tablet Take 1 tablet (0.5 mg total) by mouth 3 (three) times daily. 09/01/15  Yes Jessica U Vann, DO  arformoterol (BROVANA) 15 MCG/2ML NEBU Take 2 mLs (15 mcg total) by nebulization 2 (two) times daily. 07/29/15  Yes Vassie Loll, MD  benzonatate (TESSALON) 200 MG capsule Take 1 capsule (200 mg total) by mouth 3 (three) times daily as needed for cough. 09/01/15  Yes Jessica U Vann, DO  budesonide (PULMICORT) 0.5 MG/2ML nebulizer solution Take 2 mLs (0.5 mg total) by nebulization 2 (two) times daily. 07/29/15  Yes Vassie Loll, MD  ENSURE PLUS (ENSURE PLUS) LIQD Take 237 mLs by mouth 3 (three) times daily between meals.   Yes Historical Provider, MD  FLUoxetine (PROZAC) 40 MG capsule Take 1 capsule (40 mg total)  by mouth at bedtime. Patient taking differently: Take 40 mg by mouth at bedtime as needed (sleep).  08/06/15  Yes Jaclyn Shaggy, MD  fluticasone (FLONASE) 50 MCG/ACT nasal spray Place 2 sprays into both nostrils 2 (two) times daily. Patient taking differently: Place 2 sprays into both nostrils daily.  04/30/15  Yes Ripudeep Jenna Luo, MD  guaiFENesin (MUCINEX) 600 MG 12 hr tablet Take 1 tablet (600 mg total) by mouth 2 (two) times daily. 07/29/15  Yes Vassie Loll, MD  ipratropium-albuterol (DUONEB) 0.5-2.5 (3) MG/3ML SOLN Take 3 mLs by nebulization every 4 (four) hours as needed (shortness of breath/wheezing). 07/29/15  Yes Vassie Loll, MD  lactulose (CHRONULAC) 10 GM/15ML solution Take 30 mLs (20 g total) by mouth 2 (two) times daily as needed for mild  constipation. 09/01/15  Yes Joseph Art, DO  levofloxacin (LEVAQUIN) 500 MG tablet Take 1 tablet (500 mg total) by mouth daily. 09/01/15  Yes Jessica U Vann, DO  Mometasone Furo-Formoterol Fum (DULERA IN) Inhale 1 puff into the lungs 2 (two) times daily.   Yes Historical Provider, MD  mometasone-formoterol (DULERA) 100-5 MCG/ACT AERO Inhale 2 puffs into the lungs 2 (two) times daily.   Yes Historical Provider, MD  montelukast (SINGULAIR) 10 MG tablet Take 10 mg by mouth at bedtime.   Yes Historical Provider, MD  oxyCODONE (OXY IR/ROXICODONE) 5 MG immediate release tablet Take 1 tablet (5 mg total) by mouth every 4 (four) hours as needed for severe pain. 09/01/15  Yes Joseph Art, DO  oxyCODONE (OXYCONTIN) 10 mg 12 hr tablet Take 1 tablet (10 mg total) by mouth every 12 (twelve) hours. 09/01/15  Yes Jessica U Vann, DO  pantoprazole (PROTONIX) 40 MG tablet Take 1 tablet (40 mg total) by mouth daily at 12 noon. 09/01/15  Yes Joseph Art, DO  pregabalin (LYRICA) 75 MG capsule Take 1 capsule (75 mg total) by mouth 2 (two) times daily. Reported on 08/06/2015 08/06/15  Yes Jaclyn Shaggy, MD  QUEtiapine (SEROQUEL) 100 MG tablet Take 1 tablet (100 mg total) by mouth 2 (two) times daily. 09/01/15  Yes Joseph Art, DO  zolpidem (AMBIEN) 10 MG tablet Take 10 mg by mouth at bedtime as needed for sleep.  04/30/15  Yes Historical Provider, MD  predniSONE (DELTASONE) 20 MG tablet 20 mg x 2 days, then 10 mg x 2 days then d/c Patient not taking: Reported on 09/23/2015 09/01/15   Selinda Orion Vann, DO   BP 134/69 mmHg  Pulse 95  Resp 18  SpO2 100%  LMP 09/16/2015 Physical Exam  Constitutional: She is oriented to person, place, and time. She appears well-developed and well-nourished. No distress.  HENT:  Head: Normocephalic and atraumatic.  Eyes: Conjunctivae are normal. Pupils are equal, round, and reactive to light.  Neck: Normal range of motion. Neck supple. No JVD present.  Cardiovascular: Normal rate,  regular rhythm and normal heart sounds.   No murmur heard. Pulmonary/Chest: She has wheezes. She has no rales. She exhibits no tenderness.  audible wheezes Moderate wheezing on auscultation  Abdominal: Soft. Bowel sounds are normal. She exhibits no distension and no mass. There is no tenderness.  Musculoskeletal: Normal range of motion. She exhibits edema.   2 + pitting edema BL  Lymphadenopathy:    She has no cervical adenopathy.  Neurological: She is alert and oriented to person, place, and time. No cranial nerve deficit. She exhibits normal muscle tone. Coordination normal.  Skin: Skin is warm and dry. No rash noted.  Psychiatric: She has a normal mood and affect.  Nursing note and vitals reviewed.   ED Course  Procedures   DIAGNOSTIC STUDIES:  Oxygen Saturation is 100% on RA, normal by my interpretation.    COORDINATION OF CARE:  1:49 AM Will order breathing tx. Discussed treatment plan with pt at bedside and pt agreed to plan.  I have personally reviewed and evaluated these images and lab results as part of my medical decision-making.  MDM   Final diagnoses:  COPD exacerbation (HCC)    Patient just discharged after evaluation and treatment for COPD exacerbation was not able to get into her home so was brought back here. Arrangements have been made with the landlord to open the door so she can get in, but she complained of difficulty breathing. She is noted to have significant wheezing but the majority of this is upper airway and seems to be voluntary. Old records are reviewed confirming that she just been discharged from the ED. She is given a nebulizer treatment with albuterol and ipratropium. I went to reevaluate her and she was sleeping and lungs were almost completely clear. When Mr. therapy went in to talk with her, as soon as someone entered the room, she started with voluntary wheezing. She appears to be stable for discharge and his discharge home to continue her  previous discharge instructions.  I personally performed the services described in this documentation, which was scribed in my presence. The recorded information has been reviewed and is accurate.      [name], MD 09/24/15 [phone]

## 2015-09-24 NOTE — ED Notes (Signed)
Attempted to call landlord at number provided, received answering machine for "Ranae", left a message to return call to [facility] with phone number [phone].

## 2015-09-24 NOTE — ED Notes (Signed)
While making hourly rounding on pt. Pt was sleeping in bed, pt had taken oxygen off and room air sat 96%. Pt in NAD, no sob, NO wheezing, and no complaints at this time.

## 2015-09-24 NOTE — Discharge Instructions (Signed)
Continue the discharge instructions from your earlier ED visit.

## 2015-09-24 NOTE — ED Notes (Signed)
Patient returned with PTAR as she reports not being able to enter her home. Previously worked up for shortness of breath.

## 2015-09-24 NOTE — Patient Outreach (Signed)
Called all numbers listed in contact, including one provided by hospital Liaison [phone] without success. Was given number [phone], told patient had not minutes but was able to text.  Attempted to contact patient by texting to get additional contact information, however, unsuccessful. Per EPIC, patient is currently being treated in the emergency room.  Plan: Make another attempt to contact patient on tomorrow, February 10

## 2015-09-24 NOTE — Patient Outreach (Signed)
  Telephone call received from patient inquiring about when will her services start. It appears she is speaking of [facility] Care Management services.  She was recently discharged from [facility]. Made her aware that writer will inform [facility] team of patient's call and request to follow up with her now since she is at home. Confirmed best contact number as [phone].  Raiford Noble, MSN-Ed, RN,BSN [facility] Liaison [phone]

## 2015-09-24 NOTE — ED Notes (Signed)
Landlord called to open pt door.  Ptar called for transport.

## 2015-09-24 NOTE — ED Notes (Signed)
Explained delay to patient, awaiting contact from landlord. Breakfast tray ordered, heart healthy.

## 2015-09-24 NOTE — ED Notes (Addendum)
PT'S LANDLORD [phone]**CALL LANDLORD WHEN PT IS READY TO BE DISCHARGED SO SHE CAN UNLOCK DOOR** Pt was discharged earlier and transported home by [facility].  Landlord had locked door and pt's keys and purse are inside.  PTAR brought pt back to ED since they couldn't get in her residence.

## 2015-09-24 NOTE — Patient Outreach (Signed)
[facility]) Care Management  09/24/2015  Julie Kerr 09-01-1975 [id number]   CSW made a second attempt to try and contact patient today, after performing a thorough review of patient's EMR (Electronic Medical Record) in EPIC, noting patient's most recent chain of events with regards to emergency department visits for shortness of breath and to try and obtain pain medications. Patient was unable at the time of CSW's call.  A HIPAA compliant message was left on voicemail.  CSW is currently awaiting a return call.  CSW will also notify patient's RNCM with [facility] Care Management, Emilia Beck of patient's recent discharge from [facility], Skilled Nursing Facility where patient was residing to receive short-term rehabilitative services. CSW received an In [employer] message from [facility], [facility] Liaison with [facility], indicating that she received a call from patient requesting that patient's CSW and [facility], both with [facility], phone her at a new contact number provided [phone]).  CSW made an additional attempt to try and contact patient, again without success.  Another HIPAA compliant message was left for patient on voicemail, encouraging patient to return CSW's call at her earliest convenience.  Danford Bad, BSW, MSW, LCSW  Licensed Restaurant manager, fast food Health System  Mailing [place] [address] [place], [place] [address] Physical Address-[address], [place], [place] [address] Toll Free Main # [phone] Fax # [phone] Cell # [phone]  Fax # [phone]  [email or web address]    [facility] [employer] complies with [employer] civil rights laws and does not discriminate on the basis of race, color, national origin, age, disability, or sex.  Espaol (Spanish)  [facility] Customer service manager cumple con las  leyes federales de derechos civiles aplicables y no discrimina por motivos de raza, color, nacionalidad, edad, discapacidad o sexo.     Ti?ng Vi?t ([place]  [facility] Art gallery manager th? lu?t dn quy?n hi?n hnh c?a Lin bang v khng phn bi?t ?i x? d?a trn ch?ng t?c, mu da, ngu?n g?c qu?c gia, ? tu?i, khuy?t t?t, ho?c gi?i tnh.     (Arabic)    [facility] Customer service manager

## 2015-09-24 NOTE — ED Notes (Signed)
Attempted to call the land lord again, answering machine picked up again.

## 2015-09-25 ENCOUNTER — Telehealth: Payer: Self-pay | Admitting: *Deleted

## 2015-09-25 ENCOUNTER — Other Ambulatory Visit: Payer: Self-pay

## 2015-09-25 ENCOUNTER — Other Ambulatory Visit: Payer: Self-pay | Admitting: *Deleted

## 2015-09-25 NOTE — Telephone Encounter (Signed)
Pt HHRN called inquiring about Rx from 2/9 encounter.  ERCM reviewed the chart to find that no Rx was written from 2/9 encounter.  Chart shows pt received inhalers in ER and discharged home.  HHRN inquired about medication list- she thought it was medication pt received while in ER.  ERCM educated HHRN on AVS layout; explained that she was looking a medications pt was previously dispensed in the past vs medications she was given on this admission.  HHRN stated AVS was confusing but she better understood after ERCM explained it to her.  No further CM needs at this time.

## 2015-09-25 NOTE — Patient Outreach (Signed)
[facility]) Care Management  09/25/2015  Julie Kerr 05-Feb-1976 [id number]  CSW made a third and final attempt to try and contact patient today to perform phone assessment, as well as assess and assist with social work needs and services, without success.  A HIPAA compliant message was left for patient on voicemail.  CSW continues to await a return call.  CSW will mail an outreach letter to patient's home, encouraging patient to contact CSW at their earliest convenience, if patient is interested in receiving social work services through CSW with [facility] Therapist, music.  If CSW does not receive a return call from patient within the next 10 business days, CSW will proceed with case closure.  Required number of phone attempts will have been made and outreach letter mailed.    Danford Bad, BSW, MSW, LCSW  Licensed Restaurant manager, fast food Health System  Mailing [place] [address] [place], [place] [address] Physical Address-[address], [place], [place] [address] Toll Free Main # [phone] Fax # [phone] Cell # [phone]  Fax # [phone]  [email or web address]    [facility] [employer] complies with [employer] civil rights laws and does not discriminate on the basis of race, color, national origin, age, disability, or sex.  Espaol (Spanish)  [facility] Customer service manager cumple con las leyes federales de derechos civiles aplicables y no discrimina por motivos de raza, color, nacionalidad, edad, discapacidad o sexo.     Ti[place] Vi[place]t ([place]  [facility] Art gallery manager th? lu?t dn quy?n hi?n hnh c?a Lin bang v khng phn bi?t ?i x? d?a trn ch?ng t?c, mu da, ngu?n g?c qu?c gia, ? tu?i, khuy?t t?t, ho?c gi?i tnh.     (Arabic)    [facility] Pharmacist, hospital is Against the [employer]. and its subsidiaries comply with [employer] civil rights laws and do not discriminate on the basis of race, color, national origin, age, disability, or sex. [employer]. and its subsidiaries do not exclude people or treat them differently because of race, color, national origin, age, disability, or sex.    [employer]. and its subsidiaries provide:  . Free auxiliary aids and services, such as qualified sign language interpreters, video remote interpretation, and written information in other formats to people with disabilities when such auxiliary aids and services are necessary to ensure an equal opportunity to participate. . Free language services to people whose primary language is not English when those services are necessary to provide meaningful access, such as translated documents or oral interpretation.    If you need these services, call [phone] or if you use a TTY, call 711.   If you believe that [employer]. and its subsidiaries have failed to provide these services or discriminated in another way on the basis of race, color, national origin, age, disability, or sex, you can file a Theatre manager with:   Discrimination Grievances  [address] [address]  [place], [place] [address]   If you need help filing a grievance, call [phone] or if you use a TTY, call 711.  You can also file a civil rights complaint with the U.S. Department of Health and Health and safety inspector, Office for Civil Rights electronically through the Office for Civil Rights Complaint Portal, available at [email or web address], or by mail or phone at:   U.S.  Department of Health and Human Services  [address], [place]  Room [phone], [facility] Building  [place], [place]. [address]  [phone], [phone] (TDD)  Complaint forms are available at [email or web address].[facility] Multi-Language  Interpreter Services  English: ATTENTION: If you do not speak English, language assistance services, free of charge, are available to you. Call [phone] (TTY: 711).  Espaol (Spanish): ATENCIN: si habla espaol, tiene a su disposicin servicios gratuitos de asistencia lingstica. Llame al [phone] (TTY: 711). ???? (Chinese): ?????????????????????????????? [phone]?TTY: 711??  Ti?ng Vi?t (Vietnamese): CH : N?u b?n ni Ti?ng Vi?t, c cc d?ch v? h? tr? ngn ng? mi?n ph dnh cho b?n. G?i s? [phone] (TTY: 711).  ??? ([place]): ?? : ???? ????? ?? , ?? ?? ???? ??? ???? ? ???? . [phone] (TTY: 711)??? ??? ???? .  Tagalog (Tagalog - Filipino): PAUNAWA: Kung nagsasalita ka ng Tagalog, maaari kang gumamit ng mga serbisyo ng tulong sa wika nang walang bayad. [place] sa [phone] (TTY: 711).   [place]): :      ,      .  [phone] (: 711).  Kreyl Ayisyen ([place]): ATANSYON: Si w pale Abe People, gen svis d pou lang ki disponib gratis pou ou. Rele [phone] (TTY: 711).  Cherylann Banas Marland Kitchen[place]): ATTENTION : Si vous parlez franais, des services d'aide linguistique vous sont proposs gratuitement. Appelez le [phone] (ATS : 711).  Polski (Polish): UWAGA: Jeeli mwisz po polsku, moesz skorzysta z bezpatnej pomocy jzykowej. Zadzwo pod numer [phone] (TTY: 711).  Portugus ([place]): ATENO: Se fala portugus, encontram-se disponveis servios lingusticos, grtis. Ligue para [phone] (TTY: 711).  Italiano ([place]): ATTENZIONE: In caso la lingua parlata sia l'italiano, sono disponibili servizi di assistenza linguistica gratuiti. Chiamare il numero [phone] (TTY: 711).  Tobie Lords ([place]): ACHTUNG: Wenn Sie Deutsch sprechen,  stehen Ihnen kostenlos sprachliche Hilfsdienstleistungen zur [employer]. Rufnummer:  [phone] (TTY: 711).   (Arabic): [phone]   .            : .)711 :   (  ??? (Japanese): ??????????????????????????????????[phone] ?TTY?711?????????????????  ? (Farsi): [phone]  . ?   ? ?  ? ? ?~ ?  ?    : .??  (TTY: 711)  Din Bizaad ([place]): D77 baa ak0 n7n7zin: D77 saad bee y1n7[ti'go Jodell C[employer], saad bee 1k1'1n7da'1wo'd66', t'11 Donnetta Hail n1 h0l=, koj8' h0d77lnih [phone] (TTY:   711).

## 2015-09-25 NOTE — Patient Outreach (Signed)
Unsuccessful attempt made to contact patient via telephone at [phone]. Will make 3rd attempt on Monday, February 13.

## 2015-09-26 IMAGING — CR DG CHEST 1V PORT
1 series · 1 of 1 positions shown · non-contrast
Comparison: Same day.

CLINICAL DATA: Fever, cough.  Chest pain.

EXAM:
PORTABLE CHEST - 1 VIEW

[AP]
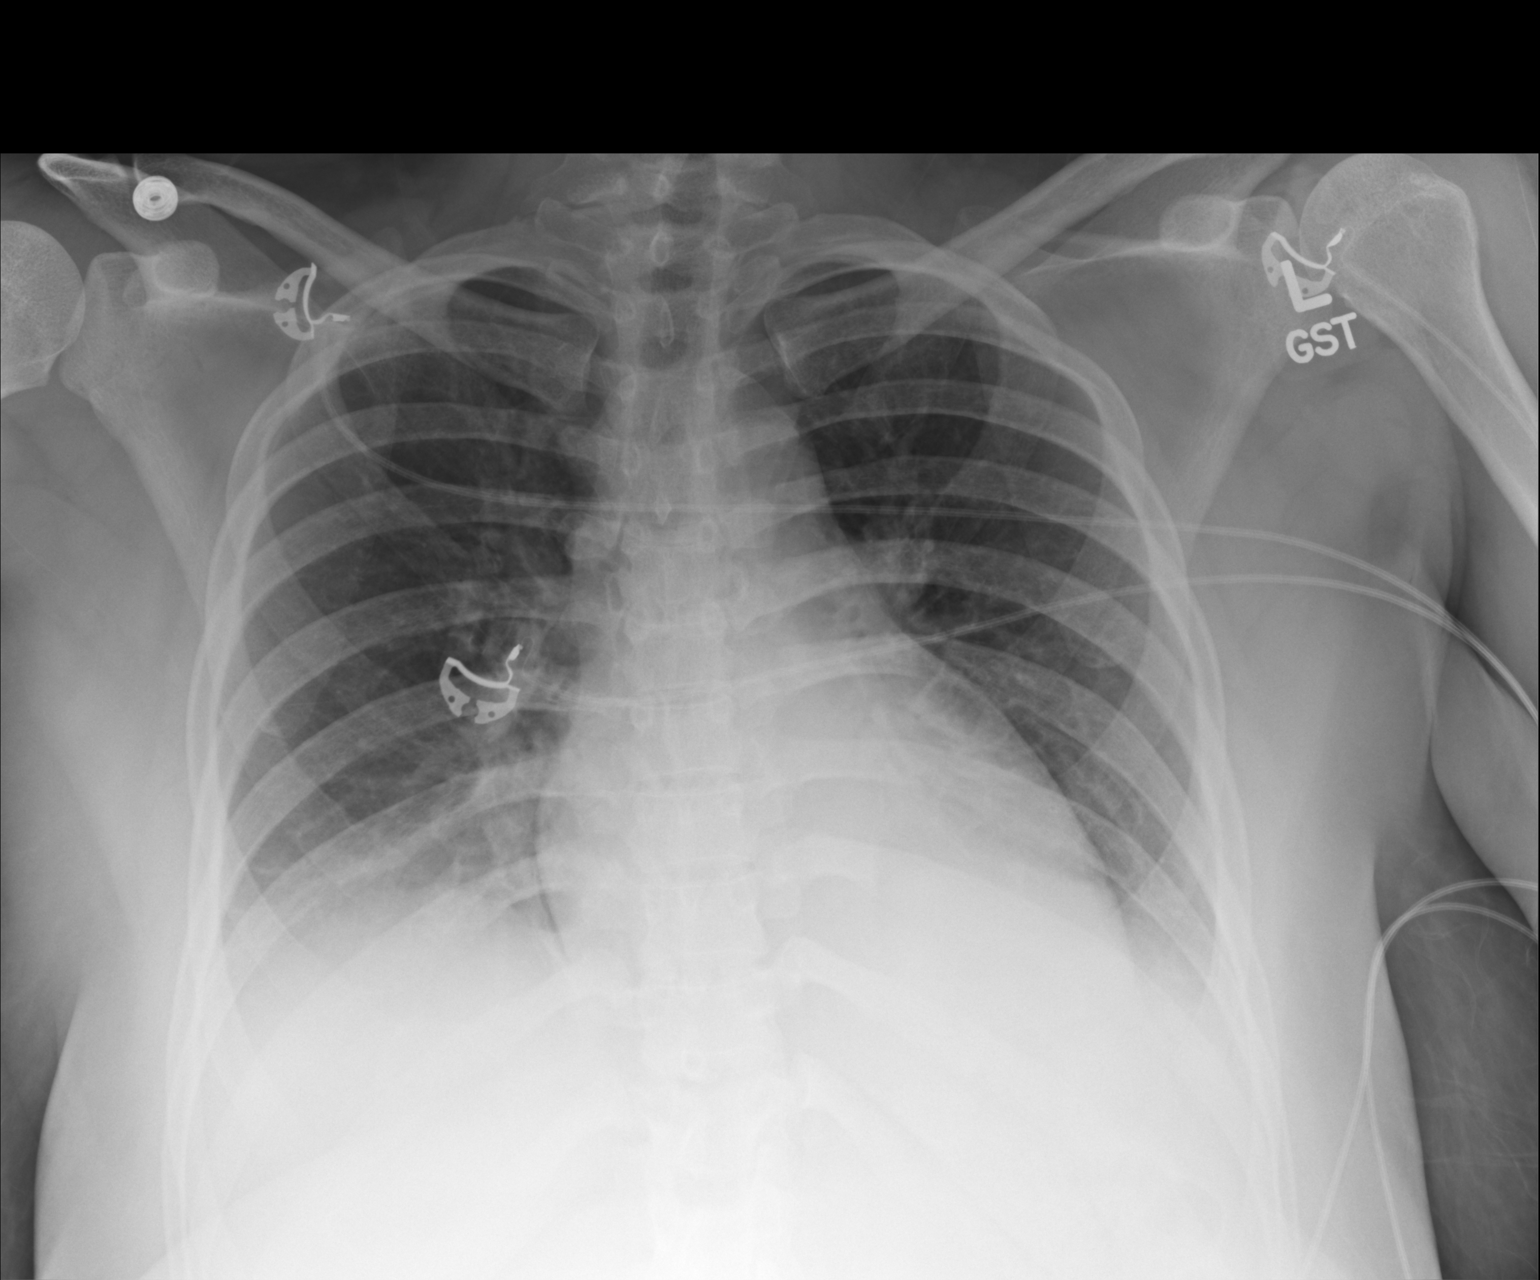

[1 of 1 positions shown; findings below may reference images not displayed]

FINDINGS: The heart size and mediastinal contours are within normal limits. No
pneumothorax is noted. Stable mild bibasilar opacities are noted
concerning for pneumonia or atelectasis with associated pleural
effusions. The visualized skeletal structures are unremarkable.
IMPRESSION: Stable mild bibasilar opacities are noted concerning for pneumonia
or atelectasis with associated pleural effusions.

## 2015-09-27 IMAGING — CR DG CHEST 1V PORT
1 series · 1 of 1 positions shown · non-contrast
Comparison: 07/27/2014.

CLINICAL DATA: Shortness of breath.

EXAM:
PORTABLE CHEST - 1 VIEW

[AP]
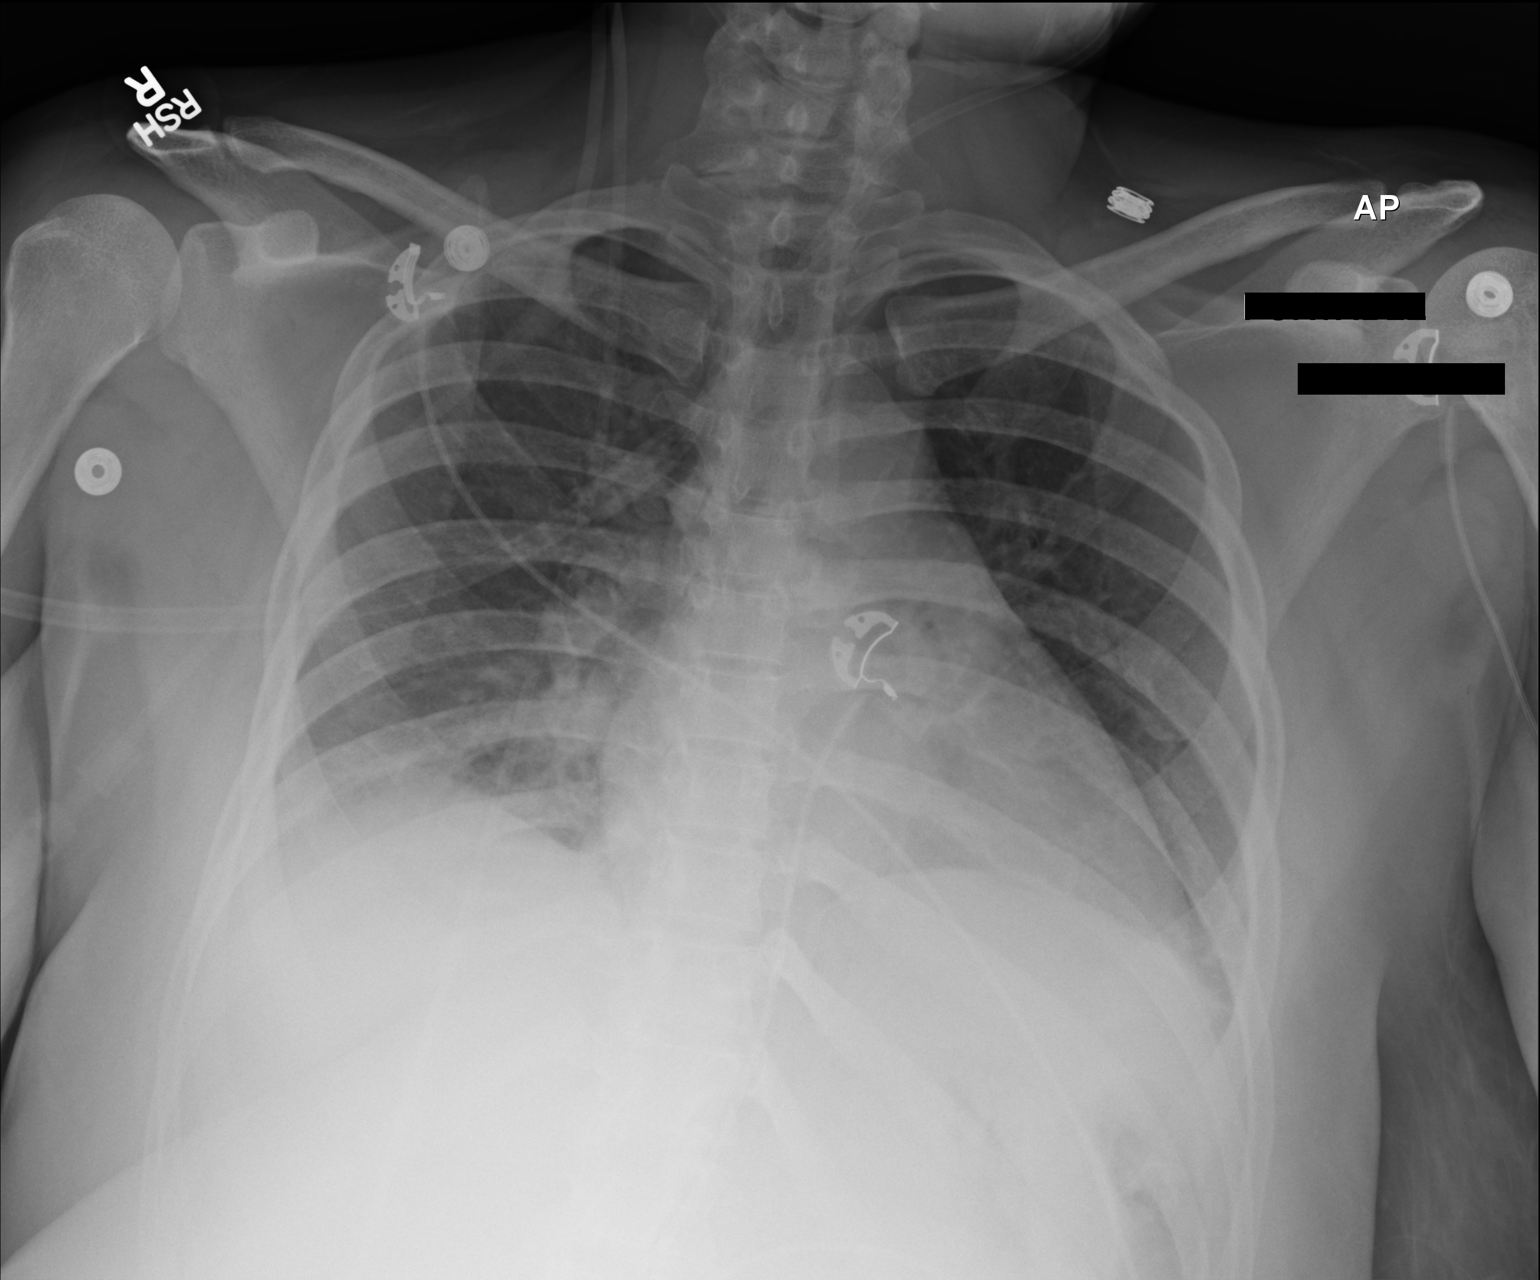

[1 of 1 positions shown; findings below may reference images not displayed]

FINDINGS: Mediastinum and hilar structures are normal. Persistent mild
bibasilar atelectasis and/or infiltrates. Stable cardiomegaly with
normal pulmonary vascularity. Small pleural effusions cannot be
excluded. No pneumothorax. No acute osseus abnormality.
IMPRESSION: Persistent mild bibasilar atelectasis and/or infiltrates. Associated
small pleural effusions cannot be excluded. Chest is stable from
prior exam.

## 2015-09-28 ENCOUNTER — Other Ambulatory Visit: Payer: Self-pay

## 2015-09-28 ENCOUNTER — Ambulatory Visit: Payer: Commercial Managed Care - HMO | Attending: Family Medicine | Admitting: Family Medicine

## 2015-09-28 ENCOUNTER — Encounter: Payer: Self-pay | Admitting: Family Medicine

## 2015-09-28 VITALS — BP 103/70 | HR 82 | Temp 99.3°F | Resp 16 | Ht 62.0 in | Wt 219.0 lb

## 2015-09-28 DIAGNOSIS — Z888 Allergy status to other drugs, medicaments and biological substances status: Secondary | ICD-10-CM | POA: Insufficient documentation

## 2015-09-28 DIAGNOSIS — Z79899 Other long term (current) drug therapy: Secondary | ICD-10-CM | POA: Insufficient documentation

## 2015-09-28 DIAGNOSIS — G894 Chronic pain syndrome: Secondary | ICD-10-CM | POA: Insufficient documentation

## 2015-09-28 DIAGNOSIS — J449 Chronic obstructive pulmonary disease, unspecified: Secondary | ICD-10-CM

## 2015-09-28 DIAGNOSIS — J383 Other diseases of vocal cords: Secondary | ICD-10-CM | POA: Insufficient documentation

## 2015-09-28 DIAGNOSIS — R296 Repeated falls: Secondary | ICD-10-CM | POA: Diagnosis not present

## 2015-09-28 DIAGNOSIS — J418 Mixed simple and mucopurulent chronic bronchitis: Secondary | ICD-10-CM

## 2015-09-28 DIAGNOSIS — F418 Other specified anxiety disorders: Secondary | ICD-10-CM

## 2015-09-28 DIAGNOSIS — F329 Major depressive disorder, single episode, unspecified: Secondary | ICD-10-CM | POA: Diagnosis not present

## 2015-09-28 DIAGNOSIS — I10 Essential (primary) hypertension: Secondary | ICD-10-CM | POA: Insufficient documentation

## 2015-09-28 DIAGNOSIS — J45909 Unspecified asthma, uncomplicated: Secondary | ICD-10-CM | POA: Diagnosis not present

## 2015-09-28 DIAGNOSIS — R05 Cough: Secondary | ICD-10-CM

## 2015-09-28 DIAGNOSIS — F1120 Opioid dependence, uncomplicated: Secondary | ICD-10-CM | POA: Insufficient documentation

## 2015-09-28 DIAGNOSIS — F419 Anxiety disorder, unspecified: Secondary | ICD-10-CM | POA: Insufficient documentation

## 2015-09-28 DIAGNOSIS — Z87891 Personal history of nicotine dependence: Secondary | ICD-10-CM | POA: Insufficient documentation

## 2015-09-28 DIAGNOSIS — Z9981 Dependence on supplemental oxygen: Secondary | ICD-10-CM | POA: Diagnosis not present

## 2015-09-28 DIAGNOSIS — M4320 Fusion of spine, site unspecified: Secondary | ICD-10-CM | POA: Diagnosis not present

## 2015-09-28 DIAGNOSIS — F1910 Other psychoactive substance abuse, uncomplicated: Secondary | ICD-10-CM

## 2015-09-28 DIAGNOSIS — R058 Other specified cough: Secondary | ICD-10-CM

## 2015-09-28 MED ORDER — ACETAMINOPHEN-CODEINE #3 300-30 MG PO TABS: 1.0000 | ORAL_TABLET | Freq: Three times a day (TID) | ORAL | Status: DC | PRN

## 2015-09-28 NOTE — Progress Notes (Signed)
Patient reports severe back pain with falls She was prescribed Seroquel but states she cannot wake up when she takes it She is asking for pain medication

## 2015-09-28 NOTE — Patient Instructions (Signed)

## 2015-09-29 ENCOUNTER — Other Ambulatory Visit: Payer: Self-pay | Admitting: Family Medicine

## 2015-09-29 ENCOUNTER — Other Ambulatory Visit: Payer: Self-pay

## 2015-09-29 ENCOUNTER — Telehealth: Payer: Self-pay

## 2015-09-29 DIAGNOSIS — R296 Repeated falls: Secondary | ICD-10-CM

## 2015-09-29 NOTE — Patient Outreach (Signed)
Unsuccessful attempt made to contact patient  Via telephone to assess need for community care coordination. Outgoing message indicated the person I was trying to contact was unable at this time, unable to leave a HIPPA compliant message as the voice mail recorder did not pick up.  Plan: Make another attempt to contact patietn again on February 14.

## 2015-09-29 NOTE — Progress Notes (Signed)
Subjective:    Patient ID: Julie Kerr, female    DOB: 11-15-1975, 40 y.o.   MRN: [id number]  HPI Julie Kerr is a 40 year old female with a history of COPD (on oxygen), vocal cord dysfunction, previous history of tracheostomy, depression and anxiety, chronic pain syndrome, drug seeking behavior, opioid dependence multiple hospital admissions for acute respiratory failure secondary to vocal cord dysfunction and COPD exacerbation.  She was discharged from a skilled nursing facility not long ago and states she was discharged without any pain medication and is requesting narcotics for her back pain. She states pain is worse after physical and occupational therapy and she has had a couple falls recently.  Of note discharge summary from 09/01/15 indicates the patient had faked a fall in the hospital and purposely hit her head on the sink several times and this was caught on video.  PCS services are for few Hours in the day and she is awaiting approval for CAP services.  I had referred her to [facility] and she was discharged due to noncompliance and 6 weeks ago I had also referred her to [facility] pain management but she informs me she never heard from them. We have had several discussions on previous visits regarding her dependence on huge doses of narcotics and other controlled substances as well as her cocaine abuse and I had tapered her off a couple of those medications in the past. She ambulates with the aid of a walker and lives by herself at this time.  For her mental health needs she is supposed to be followed by [facility].     Past Medical History  Diagnosis Date  . Asthma   . Anxiety   . Bronchitis   . Heart murmur   . Shortness of breath   . Hypertension   . Vocal cord dysfunction   . Chronic narcotic dependence [facility])     Past Surgical History  Procedure Laterality Date  . Spinal fusion  11/2012    Performed in [facility] by Dr. Mayford Knife  .  Tracheostomy  December 2015    Social History   Social History  . Marital Status: Single    Spouse Name: N/A  . Number of Children: N/A  . Years of Education: N/A   Occupational History  . Not on file.   Social History Main Topics  . Smoking status: Former Smoker -- 2.00 packs/day for 17 years    Types: Cigarettes    Quit date: 06/15/2014  . Smokeless tobacco: Never Used  . Alcohol Use: 0.0 oz/week    0 Standard drinks or equivalent per week     Comment: occasional  . Drug Use: No  . Sexual Activity: Yes    Birth Control/ Protection: Other-see comments     Comment: female partners only   Other Topics Concern  . Not on file   Social History Narrative    Allergies  Allergen Reactions  . Robitussin Dm [Dextromethorphan-Guaifenesin] Nausea And Vomiting  . Chocolate Hives  . Suboxone [Buprenorphine Hcl-Naloxone Hcl] Other (See Comments)    Aggressive behavior  . Nsaids Hives  . Rayon, Purified Hives  . Tramadol Hives    Current Outpatient Prescriptions on File Prior to Visit  Medication Sig Dispense Refill  . ALPRAZolam (XANAX) 0.5 MG tablet Take 1 tablet (0.5 mg total) by mouth 3 (three) times daily. 20 tablet 0  . arformoterol (BROVANA) 15 MCG/2ML NEBU Take 2 mLs (15 mcg total) by nebulization 2 (two) times  daily. 120 mL 0  . budesonide (PULMICORT) 0.5 MG/2ML nebulizer solution Take 2 mLs (0.5 mg total) by nebulization 2 (two) times daily. 120 mL 1  . ENSURE PLUS (ENSURE PLUS) LIQD Take 237 mLs by mouth 3 (three) times daily between meals.    Marland Kitchen FLUoxetine (PROZAC) 40 MG capsule Take 1 capsule (40 mg total) by mouth at bedtime. (Patient taking differently: Take 40 mg by mouth at bedtime as needed (sleep). ) 30 capsule 0  . ipratropium-albuterol (DUONEB) 0.5-2.5 (3) MG/3ML SOLN Take 3 mLs by nebulization every 4 (four) hours as needed (shortness of breath/wheezing). 360 mL 2  . mometasone-formoterol (DULERA) 100-5 MCG/ACT AERO Inhale 2 puffs into the lungs 2 (two) times  daily.    . montelukast (SINGULAIR) 10 MG tablet Take 10 mg by mouth at bedtime.    . pantoprazole (PROTONIX) 40 MG tablet Take 1 tablet (40 mg total) by mouth daily at 12 noon.    . QUEtiapine (SEROQUEL) 100 MG tablet Take 1 tablet (100 mg total) by mouth 2 (two) times daily.    . fluticasone (FLONASE) 50 MCG/ACT nasal spray Place 2 sprays into both nostrils 2 (two) times daily. (Patient not taking: Reported on 09/28/2015) 16 g 3  . guaiFENesin (MUCINEX) 600 MG 12 hr tablet Take 1 tablet (600 mg total) by mouth 2 (two) times daily. (Patient not taking: Reported on 09/28/2015) 40 tablet 0  . lactulose (CHRONULAC) 10 GM/15ML solution Take 30 mLs (20 g total) by mouth 2 (two) times daily as needed for mild constipation. (Patient not taking: Reported on 09/28/2015) 240 mL 0  . pregabalin (LYRICA) 75 MG capsule Take 1 capsule (75 mg total) by mouth 2 (two) times daily. Reported on 08/06/2015 (Patient not taking: Reported on 09/28/2015) 60 capsule 1  . zolpidem (AMBIEN) 10 MG tablet Take 10 mg by mouth at bedtime as needed for sleep. Reported on 09/28/2015  0   No current facility-administered medications on file prior to visit.      Review of Systems Constitutional: Negative for activity change, appetite change and fatigue.  HENT: Negative for congestion, sinus pressure and sore throat, positive for post nasal drip Eyes: Negative for visual disturbance.  Respiratory: positive for cough, chest tightness, shortness of breath and wheezing.   Cardiovascular: Negative for chest pain and palpitations.  Gastrointestinal: Negative for abdominal pain, constipation and abdominal distention.  Endocrine: Negative for polydipsia.  Genitourinary: Negative for dysuria and frequency.  Musculoskeletal: Positive for myalgias and back pain. Negative for arthralgias.  Skin: Negative for rash.  Neurological: Negative for tremors, light-headedness and numbness.  Hematological: Does not bruise/bleed easily.    Psychiatric/Behavioral: Negative for behavioral problems and agitation.    Objective: Filed Vitals:   09/28/15 1535  BP: 103/70  Pulse: 82  Temp: 99.3 F (37.4 C)  Resp: 16  Height: 5\' 2"  (1.575 m)  Weight: 219 lb (99.338 kg)  SpO2: 100%      Physical Exam  Constitutional: She is oriented to person, place, and time. She appears well-developed and well-nourished. No distress.  Neck: Normal range of motion. No JVD present.  Cardiovascular: Normal rate, regular rhythm, normal heart sounds and intact distal pulses.  Exam reveals no gallop.   No murmur heard. Pulmonary/Chest: Effort normal and upper airway respiratory sounds heard on auscultation. No respiratory distress. She has no wheezes. She has no rales. She exhibits no tenderness.  Abdominal: Soft. Bowel sounds are normal. She exhibits no distension and no mass. There is no tenderness.  Musculoskeletal:Vertical lumbar spine surgical scar, tenderness on palpation of lumbar region  Neurological: She is alert and oriented to person, place, and time. She has normal reflexes.  Skin: Skin is warm and dry. She is not diaphoretic.  Psychiatric: She has a normal mood and affect.       Assessment & Plan:  Chronic pain syndrome/opioid dependence/drug seeking: I have referred her to pain clinic several times and she has been non-compliant. [facility] called today and referral has been faxed over again Given 30 pills of Tylenol with codeine after which she will no longer receive any pain medications from me. [employer] [place] control substances database reviewed.  Frequent falls: Patient attributes this to severe back pain. Ambulate with the aid of a walker. PCS services and ground; CAP services spending Undergoing occupational therapy and physical therapy.  COPD/Chronic asthmatic bronchitis: Currently saturating well on room air. Oxygen was discontinued by [employer] and I have informed her that based on her  current oxygen saturation she was not admits to requirement for recertification.  Upper airway syndrome with vocal cord dysfunction: She was previously followed pulmonary and has been noncompliant with appointments.  Anxiety and depression Currently seeing mental health for management of this.  Substance abuse: Cocaine use. I have explained to the patient had her concomitant use of cocaine and abuse of narcotics puts that risk of severe complications

## 2015-09-29 NOTE — Telephone Encounter (Signed)
Call placed to the patient # [phone] to inform her that the prescriptions have been obtained from Dr Venetia Night for a shower chair and [facility] and inquire if she has a DME company that she prefers to have the prescriptions sent to.  A HIPAA compliant voice mail message was left requesting a call back ot # [phone] or [phone].

## 2015-09-29 NOTE — Patient Outreach (Signed)
Unsuccessful attempt made to contact patient via telephone at # [phone]) [phone].   Unable to leave HIPPA compliant message due to no message recording pickup.  Plan: Make another attempt to contact patient on 09/30/2015

## 2015-09-30 ENCOUNTER — Ambulatory Visit: Payer: Self-pay | Admitting: Family Medicine

## 2015-09-30 ENCOUNTER — Other Ambulatory Visit: Payer: Self-pay

## 2015-09-30 ENCOUNTER — Telehealth: Payer: Self-pay

## 2015-09-30 IMAGING — CR DG ABD PORTABLE 1V
1 series · 1 of 1 positions shown · non-contrast
Comparison: 07/25/2014

CLINICAL DATA: NG placement

EXAM:
PORTABLE ABDOMEN - 1 VIEW

[AP]
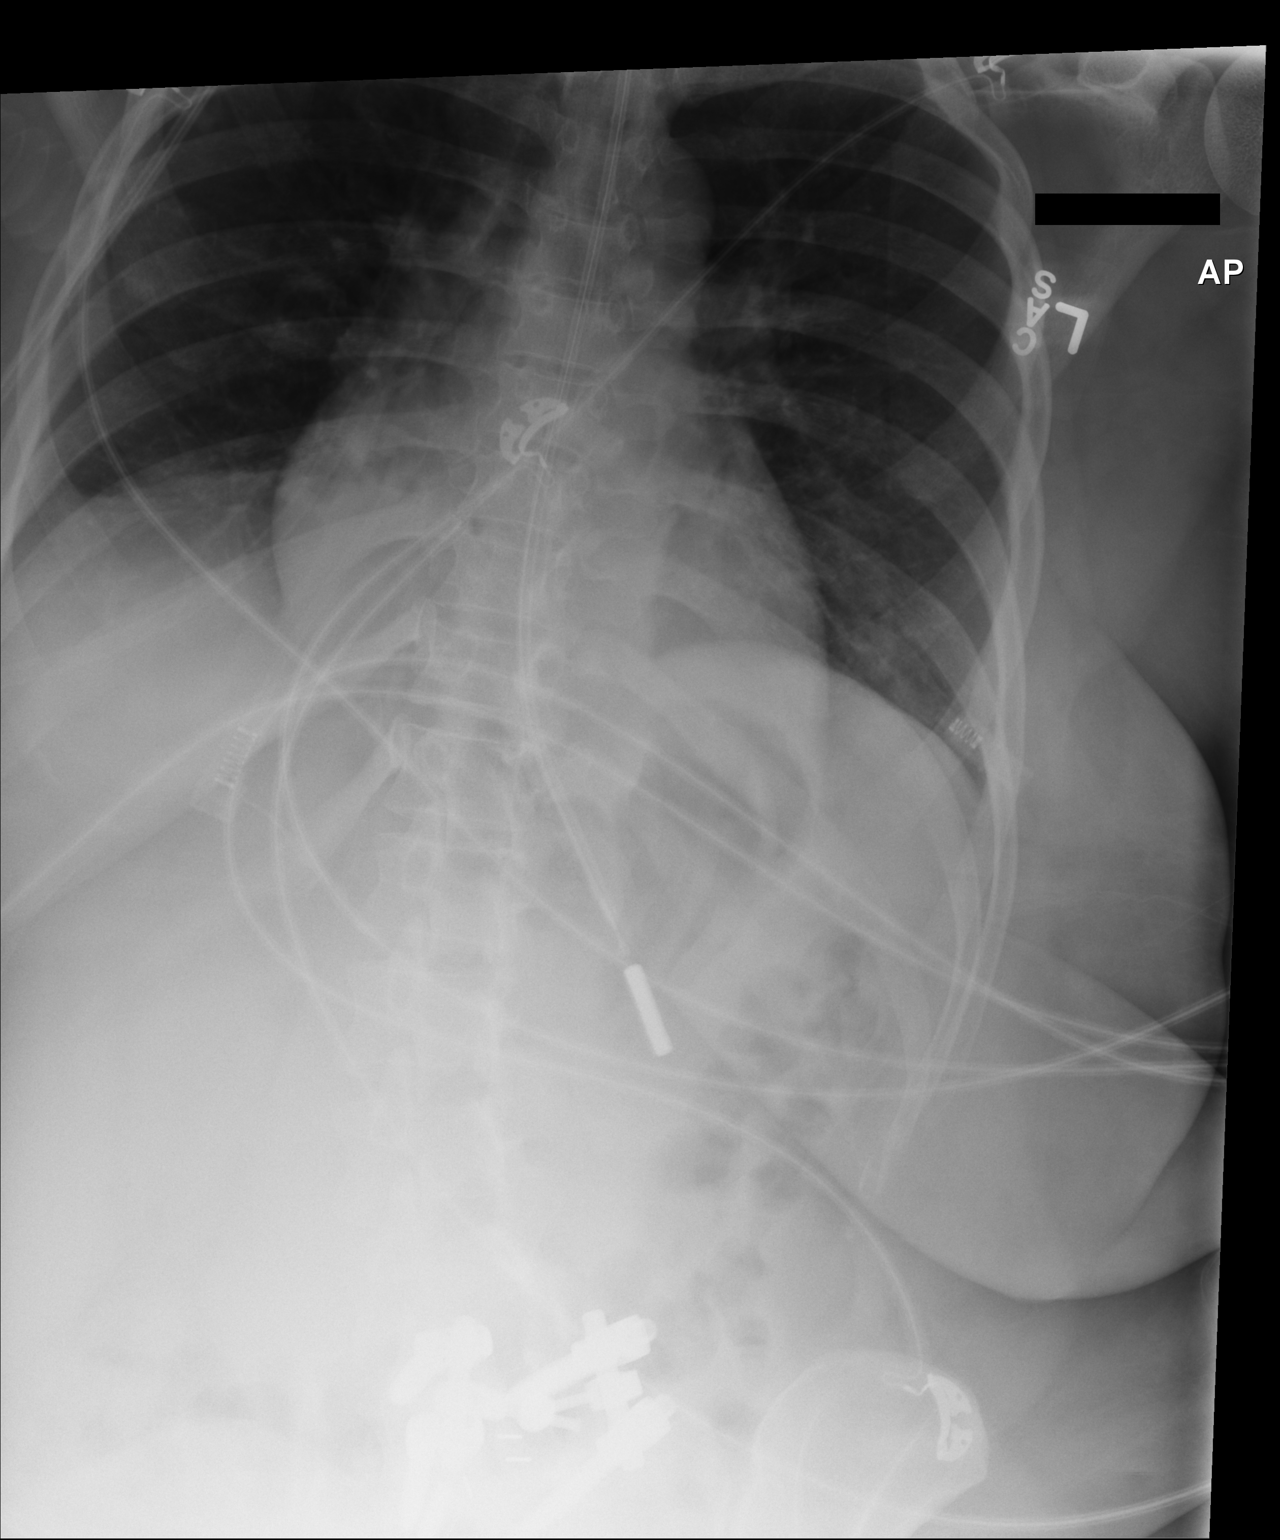

[1 of 1 positions shown; findings below may reference images not displayed]

FINDINGS: Feeding tube tip in the body the stomach. NG has been removed since
the prior study. Normal bowel gas pattern.
IMPRESSION: Feeding tube tip in the body of the stomach.

## 2015-09-30 IMAGING — CR DG CHEST 1V PORT
1 series · 1 of 1 positions shown · non-contrast
Comparison: 07/29/2014

CLINICAL DATA: Respiratory failure and confusion

EXAM:
PORTABLE CHEST - 1 VIEW

[AP]
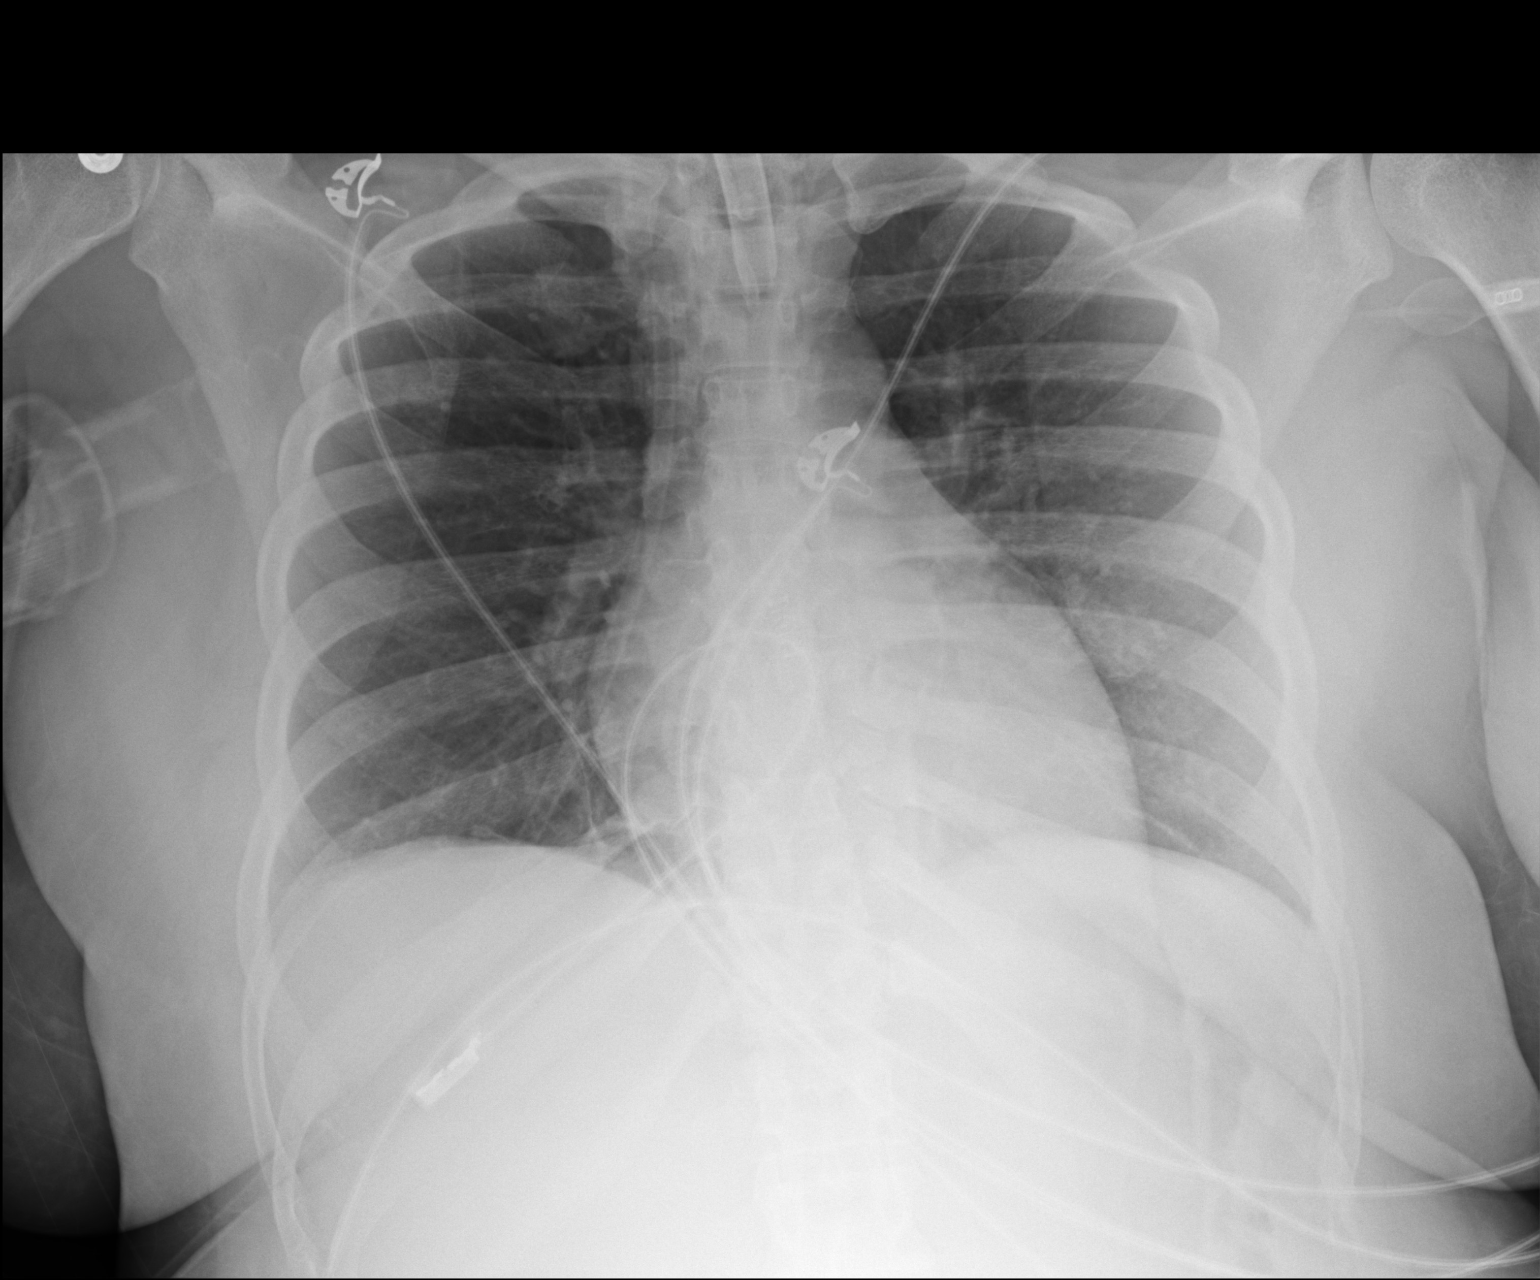

[1 of 1 positions shown; findings below may reference images not displayed]

FINDINGS: Tracheostomy tube is noted in satisfactory position. The nasogastric
catheter has been removed from the prior exam. No focal infiltrate
or sizable effusion is seen. The cardiac shadow is stable.
IMPRESSION: No acute abnormality noted

## 2015-09-30 NOTE — Telephone Encounter (Signed)
[name] wrote scripts for shower chair and bedside commode. Call placed to #[phone] to determine if patient has a DME company she prefers; unable to reach patient. Also unable to reach patient at [phone] as number not accepting calls. Prescriptions for shower chair and bedside commode (along with Provider NPI and diagnosis code) faxed to [facility]-Fax # [phone].

## 2015-09-30 NOTE — Patient Outreach (Signed)
This RNCM was finally successful in making contact with patient via telephone. Patient identified herself by providing address and date of birth.  Patient and this RNCM agreed on home visit on February 21 for additional community care coordination

## 2015-10-01 ENCOUNTER — Telehealth: Payer: Self-pay | Admitting: Family Medicine

## 2015-10-01 NOTE — Telephone Encounter (Signed)
Tobi Bastos from [facility] called and would like use to encourage patient to participate in their care management program. Please follow up.

## 2015-10-06 ENCOUNTER — Telehealth: Payer: Self-pay | Admitting: Family Medicine

## 2015-10-06 ENCOUNTER — Other Ambulatory Visit: Payer: Self-pay

## 2015-10-06 DIAGNOSIS — G894 Chronic pain syndrome: Secondary | ICD-10-CM

## 2015-10-06 NOTE — Telephone Encounter (Signed)
Pt. Called stating that she called [facility] to schedule an appointment and she had been denied, they did not tell her why she had been denied. Please f/u

## 2015-10-06 NOTE — Telephone Encounter (Signed)
Called and explained to patient that this office was unaware of the reason why she was denied.  Explained that Toma Copier MD reviewed her chart and results and made decision based on that.

## 2015-10-06 NOTE — Patient Outreach (Signed)
[facility]) Care Management  10/06/2015  Julie Kerr May 12, 1976 [id number]       Light Bill: pateint has disconnect notice, disconnection scheduled for 2/22. This RNCM called [facility]  Social Support: Has daughter who lives in another part of [place].  Daughter has a son Hs a son who lives with his father in another part of the state

## 2015-10-06 NOTE — Telephone Encounter (Signed)
error 

## 2015-10-07 ENCOUNTER — Ambulatory Visit: Payer: Self-pay

## 2015-10-07 NOTE — Patient Outreach (Addendum)
[facility]) Care Management  October 06, 2015   Julie Kerr 03-12-1976 [id number]   Initial home visit for assessment of community care coordination. During this home visit, patient complained of intense pain in her lower back area around midback.  Patient states she fell before hospitalized and hurt her back again.  Patient stated she needed to go to the doctor.  This RNCM was unable to get an appointment at [facility] due, was told patient should go to Urgent Care, if she is unable to wait until next appointment opening in March.   This RNCM learned patient had been to the practice, had been given 30 tylenol #3 which she reports using her last one 2 days earlier.    Pain Management: This RNCM was told a referral had been made to [facility] in [place], [place]. This RNCM called Toma Copier, was told the patient had been denied admission to practice. Call made to [facility] to advise of [name]'s deciison, requested a referral to another pain mangement provider.     ADLs/IADLs Deficits: Patient has in home aide services 7 days per week, total 80 hours per month. This RNCM explained to patient the next option would be to get on list for CAPS, or move to an assisted living facility. Patient has a daughter who lives in another part of [place], [place].   Chronic Disease Education: Initiation of COPD education, COPD & Asthma Assessment Completed

## 2015-10-09 ENCOUNTER — Other Ambulatory Visit: Payer: Self-pay | Admitting: *Deleted

## 2015-10-09 NOTE — Patient Outreach (Signed)
[facility]) Care Management  10/09/2015  Julie Kerr 1975-08-25 [id number]   CSW will perform a case closure on patient, due to inability to maintain phone contact with patient, despite required number of phone attempts made and outreach letter mailed to patient's home.   CSW will notify patient's RNCM with [facility], Julie Kerr of CSW's plans to close patient's case. CSW will fax a correspondence letter to patient's Primary Care Physician, Julie Kerr to ensure that Julie Kerr is aware of CSW's case closure plans.   CSW will submit a case closure request to Julie Kerr, Care Management Assistant with [facility], in the form of an In [employer].  CSW will ensure that Julie Kerr is aware of Julie Kerr, RNCM with [facility], continued involvement with patient's care.  Danford Bad, BSW, MSW, LCSW  Licensed Restaurant manager, fast food Health System  Mailing [address] [place], [place] [address] Physical Address-[address], [place], [place] [address] Toll Free Main # [phone] Fax # [phone] Cell # [phone]  Fax # [phone]  Mardene [email or web address]  [employer]  Discrimination is Against the [employer]. and its subsidiaries comply with applicable Federal civil rights laws and do not discriminate on the basis of race, color, national origin, age, disability, or sex. [employer]. and its subsidiaries do not exclude people or treat them differently because of race, color, national origin, age, disability, or sex.    [employer]. and its subsidiaries provide:  . Free auxiliary aids and services, such as qualified sign language interpreters, video remote interpretation, and written information in other formats to people with disabilities when such auxiliary aids and services are necessary to  ensure an equal opportunity to participate. . Free language services to people whose primary language is not English when those services are necessary to provide meaningful access, such as translated documents or oral interpretation.    If you need these services, call [phone] or if you use a TTY, call 711.   If you believe that [employer]. and its subsidiaries have failed to provide these services or discriminated in another way on the basis of race, color, national origin, age, disability, or sex, you can file a Theatre manager with:   Discrimination Grievances  [address] [address]  [place], [place] [address]   If you need help filing a grievance, call [phone] or if you use a TTY, call 711.  You can also file a civil rights complaint with the U.S. Department of Health and Health and safety inspector, Office for Civil Rights electronically through the Office for Civil Rights Complaint Portal, available at [email or web address]-[email or web address], or by mail or phone at:   U.S. Department of Health and Human Services  [address] [place]  Room [phone], [facility] Building  [place], [place]. [address]  [phone], [phone] (TDD)  Complaint forms are available at [email or web address] Multi-Language Interpreter Services  English: ATTENTION: If you do not speak English, language assistance services, free of charge, are available to you. Call [phone] (TTY: 711).  Espaol (Spanish): ATENCIN: si habla espaol, tiene a su disposicin servicios gratuitos de asistencia lingstica. Llame al [phone] (TTY: 711). ???? (Chinese): ?????????????????????????????? [phone]?TTY: 711??  Ti?ng Vi?t (Vietnamese): CH : N?u b?n ni Ti?ng Vi?t, c cc d?ch v? h? tr? ngn ng? mi?n ph dnh cho b?n. G?i s? [phone] (TTY: 711).  ??? ([place]): ?? : ???? ????? ?? , ?? ?? ???? ??? ???? ? ???? .  [phone] (TTY: 711)??? ??? ???[phone] (Tagalog -  Filipino): PAUNAWA: Kung nagsasalita ka ng Tagalog, maaari kang gumamit ng mga serbisyo ng tulong sa wika nang walang bayad. [place] sa [phone] (TTY: 711).   [place]zerba[place]): :      ,      .  [phone] (: 711).  Kreyl Ayisyen (Jersey): ATANSYON: Si w pale Abe People, gen svis d pou lang ki disponib gratis pou ou. Rele [phone] (TTY: 711).  Cherylann Banas Marland [place]): ATTENTION : Si vous parlez franais, des services d'aide linguistique vous sont proposs gratuitement. Appelez le [phone] (ATS : 711).  Polski (Polish): UWAGA: Jeeli mwisz po polsku, moesz skorzysta z bezpatnej pomocy jzykowej. Zadzwo pod numer [phone] (TTY: 711).  Portugus ([place]): ATENO: Se fala portugus, encontram-se disponveis servios lingusticos, grtis. Ligue para [phone] (TTY: 711).  Italiano ([place]): ATTENZIONE: In caso la lingua parlata sia l'italiano, sono disponibili servizi di assistenza linguistica gratuiti. Chiamare il numero [phone] (TTY: 711).  Tobie Lords ([place]): ACHTUNG: Wenn Sie Deutsch sprechen,  stehen Ihnen kostenlos sprachliche Hilfsdienstleistungen zur [employer]. Rufnummer: [phone] (TTY: 711).   (Arabic): [phone]   .            : .)711 :   (  ??? (Japanese): ??????????????????????????????????[phone] ?TTY?711?????????????????  ? (Farsi): [phone]  . ?   ? ?  ? ? ?~ ?  ?    : .??  (TTY: 711)  Din Bizaad (Navajo): D77 baa ak0 n7n7zin: D77 saad bee y1n7[ti'go Jodell Cipro, saad bee 1k1'1n7da'1wo'd66', t'11 Donnetta Hail n1 h0l=, koj8' h0d77lnih [phone] (TTY:   711).

## 2015-10-13 ENCOUNTER — Ambulatory Visit: Payer: Self-pay

## 2015-10-15 ENCOUNTER — Other Ambulatory Visit: Payer: Self-pay

## 2015-10-15 NOTE — Patient Outreach (Signed)
[facility]) Care Management  10/15/2015  Julie Kerr Dec 13, 1975 [id number]   This RNCM arrived at patient's home for chronic disease education (COPD) and assess for community care coordination needs.  This RNCM ranged the door bell and knocked several times, waited approximately 20 minutes. Call made to telephone number listed as contact [phone], no answer.  This RNCM left business card with my contact information for patient at front door.  Plan: Attempt telephone contact Friday to reschedule today's home visit.

## 2015-10-16 ENCOUNTER — Other Ambulatory Visit: Payer: Self-pay

## 2015-10-16 NOTE — Patient Outreach (Signed)
Unsuccessful attempt made to contact patient telephone at # [phone]. Unable to leave message as outgoing message indicated patient was not available or out of network.  Plan: Make another telephone attempt to make contact by telephone on Monday, March 6.

## 2015-10-19 ENCOUNTER — Other Ambulatory Visit: Payer: Self-pay

## 2015-10-19 ENCOUNTER — Telehealth: Payer: Self-pay | Admitting: Family Medicine

## 2015-10-19 NOTE — Patient Outreach (Signed)
This RNCM attempt telephone contact with patient at [phone], was told to remove that number as patient's contact, she is patient's landlord, allowed patient to use her telephone once and several people have called.  Unsuccessful in contacting patient at number listed [phone]) [phone]. Unable to leave HIPPA compliant message due to there not being a recording device.  Plan: Make another attempt to contact patient on Wednesday, March 8

## 2015-10-19 NOTE — Telephone Encounter (Signed)
[facility] states patient has missed 4 appointment for skilled nursing , will try to reschedule 1 more time or will be discharged.

## 2015-10-29 ENCOUNTER — Other Ambulatory Visit: Payer: Self-pay

## 2015-10-29 NOTE — Patient Outreach (Signed)
Unsuccessful attempt made to contact patient via telephone for community care coordination. Previous calls have been unsuccessful in attempting to contact patient including an unanswered door for a scheduled home visit.  Plan: Discharge patient from caseload due to inability to maintain contact with patient

## 2015-11-04 ENCOUNTER — Inpatient Hospital Stay (HOSPITAL_COMMUNITY)
Admission: EM | Admit: 2015-11-04 | Discharge: 2015-11-07 | DRG: 154 | Disposition: A | Discharge status: Home / Self Care | Payer: Medicare HMO | Attending: Internal Medicine | Admitting: Internal Medicine

## 2015-11-04 ENCOUNTER — Emergency Department (HOSPITAL_COMMUNITY): Payer: Medicare HMO

## 2015-11-04 ENCOUNTER — Encounter (HOSPITAL_COMMUNITY): Payer: Self-pay | Admitting: *Deleted

## 2015-11-04 DIAGNOSIS — J383 Other diseases of vocal cords: Secondary | ICD-10-CM | POA: Diagnosis not present

## 2015-11-04 DIAGNOSIS — Z981 Arthrodesis status: Secondary | ICD-10-CM | POA: Diagnosis not present

## 2015-11-04 DIAGNOSIS — J45909 Unspecified asthma, uncomplicated: Secondary | ICD-10-CM | POA: Diagnosis present

## 2015-11-04 DIAGNOSIS — Z888 Allergy status to other drugs, medicaments and biological substances status: Secondary | ICD-10-CM | POA: Diagnosis not present

## 2015-11-04 DIAGNOSIS — G894 Chronic pain syndrome: Secondary | ICD-10-CM | POA: Diagnosis not present

## 2015-11-04 DIAGNOSIS — Z7951 Long term (current) use of inhaled steroids: Secondary | ICD-10-CM

## 2015-11-04 DIAGNOSIS — Z6841 Body Mass Index (BMI) 40.0 and over, adult: Secondary | ICD-10-CM

## 2015-11-04 DIAGNOSIS — Z91018 Allergy to other foods: Secondary | ICD-10-CM | POA: Diagnosis not present

## 2015-11-04 DIAGNOSIS — J9811 Atelectasis: Secondary | ICD-10-CM | POA: Diagnosis present

## 2015-11-04 DIAGNOSIS — Z825 Family history of asthma and other chronic lower respiratory diseases: Secondary | ICD-10-CM | POA: Diagnosis not present

## 2015-11-04 DIAGNOSIS — Z801 Family history of malignant neoplasm of trachea, bronchus and lung: Secondary | ICD-10-CM

## 2015-11-04 DIAGNOSIS — J449 Chronic obstructive pulmonary disease, unspecified: Secondary | ICD-10-CM | POA: Diagnosis present

## 2015-11-04 DIAGNOSIS — I10 Essential (primary) hypertension: Secondary | ICD-10-CM | POA: Diagnosis present

## 2015-11-04 DIAGNOSIS — J3800 Paralysis of vocal cords and larynx, unspecified: Secondary | ICD-10-CM | POA: Diagnosis present

## 2015-11-04 DIAGNOSIS — Z886 Allergy status to analgesic agent status: Secondary | ICD-10-CM | POA: Diagnosis not present

## 2015-11-04 DIAGNOSIS — F1120 Opioid dependence, uncomplicated: Secondary | ICD-10-CM | POA: Diagnosis present

## 2015-11-04 DIAGNOSIS — Z79899 Other long term (current) drug therapy: Secondary | ICD-10-CM | POA: Diagnosis not present

## 2015-11-04 DIAGNOSIS — D509 Iron deficiency anemia, unspecified: Secondary | ICD-10-CM | POA: Diagnosis present

## 2015-11-04 DIAGNOSIS — R058 Other specified cough: Secondary | ICD-10-CM | POA: Diagnosis present

## 2015-11-04 DIAGNOSIS — J9601 Acute respiratory failure with hypoxia: Secondary | ICD-10-CM | POA: Diagnosis present

## 2015-11-04 DIAGNOSIS — F410 Panic disorder [episodic paroxysmal anxiety] without agoraphobia: Secondary | ICD-10-CM | POA: Diagnosis not present

## 2015-11-04 DIAGNOSIS — R0603 Acute respiratory distress: Secondary | ICD-10-CM

## 2015-11-04 DIAGNOSIS — Z87891 Personal history of nicotine dependence: Secondary | ICD-10-CM | POA: Diagnosis not present

## 2015-11-04 DIAGNOSIS — F411 Generalized anxiety disorder: Secondary | ICD-10-CM | POA: Diagnosis not present

## 2015-11-04 DIAGNOSIS — F1910 Other psychoactive substance abuse, uncomplicated: Secondary | ICD-10-CM | POA: Diagnosis present

## 2015-11-04 DIAGNOSIS — R0602 Shortness of breath: Secondary | ICD-10-CM | POA: Diagnosis present

## 2015-11-04 DIAGNOSIS — R05 Cough: Secondary | ICD-10-CM | POA: Diagnosis present

## 2015-11-04 LAB — INFLUENZA PANEL BY PCR (TYPE A & B)
H1N1FLUPCR: NOT DETECTED
Influenza A By PCR: NEGATIVE
Influenza B By PCR: NEGATIVE

## 2015-11-04 LAB — CBC WITH DIFFERENTIAL/PLATELET
BASOS PCT: 1 %
Basophils Absolute: 0 10*3/uL (ref 0.0–0.1)
EOS ABS: 0.2 10*3/uL (ref 0.0–0.7)
EOS PCT: 3 %
HCT: 36.2 % (ref 36.0–46.0)
HEMOGLOBIN: 11.4 g/dL — AB (ref 12.0–15.0)
Lymphocytes Relative: 26 %
Lymphs Abs: 2.1 10*3/uL (ref 0.7–4.0)
MCH: 23.9 pg — ABNORMAL LOW (ref 26.0–34.0)
MCHC: 31.5 g/dL (ref 30.0–36.0)
MCV: 75.9 fL — ABNORMAL LOW (ref 78.0–100.0)
Monocytes Absolute: 0.3 10*3/uL (ref 0.1–1.0)
Monocytes Relative: 4 %
NEUTROS PCT: 66 %
Neutro Abs: 5.4 10*3/uL (ref 1.7–7.7)
PLATELETS: 487 10*3/uL — AB (ref 150–400)
RBC: 4.77 MIL/uL (ref 3.87–5.11)
RDW: 17.6 % — ABNORMAL HIGH (ref 11.5–15.5)
WBC: 8.2 10*3/uL (ref 4.0–10.5)

## 2015-11-04 LAB — BASIC METABOLIC PANEL
Anion gap: 13 (ref 5–15)
BUN: 6 mg/dL (ref 6–20)
CALCIUM: 9.6 mg/dL (ref 8.9–10.3)
CO2: 20 mmol/L — ABNORMAL LOW (ref 22–32)
CREATININE: 0.83 mg/dL (ref 0.44–1.00)
Chloride: 106 mmol/L (ref 101–111)
GFR calc non Af Amer: >60 mL/min (ref >60)
Glucose, Bld: 101 mg/dL — ABNORMAL HIGH (ref 65–99)
Potassium: 3.6 mmol/L (ref 3.5–5.1)
SODIUM: 139 mmol/L (ref 135–145)

## 2015-11-04 LAB — I-STAT TROPONIN, ED: TROPONIN I, POC: 0 ng/mL (ref 0.00–0.08)

## 2015-11-04 LAB — MRSA PCR SCREENING: MRSA by PCR: POSITIVE — AB

## 2015-11-04 LAB — BRAIN NATRIURETIC PEPTIDE: B Natriuretic Peptide: 8.4 pg/mL (ref 0.0–100.0)

## 2015-11-04 LAB — HCG, QUANTITATIVE, PREGNANCY: hCG, Beta Chain, Quant, S: <1 m[IU]/mL (ref <5)

## 2015-11-04 MED ORDER — ALBUTEROL SULFATE (2.5 MG/3ML) 0.083% IN NEBU
5.0000 mg | INHALATION_SOLUTION | Freq: Once | RESPIRATORY_TRACT | Status: AC
  Administered 2015-11-04: 5 mg via RESPIRATORY_TRACT
  Filled 2015-11-04: qty 6

## 2015-11-04 MED ORDER — FENTANYL CITRATE (PF) 100 MCG/2ML IJ SOLN
50.0000 ug | Freq: Once | INTRAMUSCULAR | Status: AC
  Administered 2015-11-04: 50 ug via INTRAVENOUS
  Filled 2015-11-04: qty 2

## 2015-11-04 MED ORDER — CETYLPYRIDINIUM CHLORIDE 0.05 % MT LIQD
7.0000 mL | Freq: Two times a day (BID) | OROMUCOSAL | Status: DC
  Administered 2015-11-04 – 2015-11-05 (×2): 7 mL via OROMUCOSAL

## 2015-11-04 MED ORDER — QUETIAPINE FUMARATE 25 MG PO TABS
100.0000 mg | ORAL_TABLET | Freq: Every day | ORAL | Status: DC
  Administered 2015-11-04 – 2015-11-06 (×3): 100 mg via ORAL
  Filled 2015-11-04: qty 4
  Filled 2015-11-04: qty 1
  Filled 2015-11-04: qty 4
  Filled 2015-11-04: qty 1

## 2015-11-04 MED ORDER — LORAZEPAM 2 MG/ML IJ SOLN
2.0000 mg | Freq: Once | INTRAMUSCULAR | Status: AC
  Administered 2015-11-04: 2 mg via INTRAVENOUS
  Filled 2015-11-04: qty 1

## 2015-11-04 MED ORDER — IPRATROPIUM-ALBUTEROL 0.5-2.5 (3) MG/3ML IN SOLN
3.0000 mL | Freq: Once | RESPIRATORY_TRACT | Status: AC
  Administered 2015-11-04: 3 mL via RESPIRATORY_TRACT
  Filled 2015-11-04: qty 3

## 2015-11-04 MED ORDER — MAGNESIUM SULFATE 2 GM/50ML IV SOLN
2.0000 g | Freq: Once | INTRAVENOUS | Status: AC
  Administered 2015-11-04: 2 g via INTRAVENOUS
  Filled 2015-11-04: qty 50

## 2015-11-04 MED ORDER — HEPARIN SODIUM (PORCINE) 5000 UNIT/ML IJ SOLN
5000.0000 [IU] | Freq: Three times a day (TID) | INTRAMUSCULAR | Status: DC
  Administered 2015-11-04 – 2015-11-07 (×9): 5000 [IU] via SUBCUTANEOUS
  Filled 2015-11-04 (×10): qty 1

## 2015-11-04 MED ORDER — KETAMINE HCL-SODIUM CHLORIDE 100-0.9 MG/10ML-% IV SOSY
100.0000 mg | PREFILLED_SYRINGE | Freq: Once | INTRAVENOUS | Status: DC
  Filled 2015-11-04: qty 10

## 2015-11-04 MED ORDER — MUPIROCIN 2 % EX OINT
1.0000 "application " | TOPICAL_OINTMENT | Freq: Two times a day (BID) | CUTANEOUS | Status: DC
  Administered 2015-11-04 – 2015-11-07 (×6): 1 via NASAL
  Filled 2015-11-04 (×3): qty 22

## 2015-11-04 MED ORDER — LORAZEPAM 2 MG/ML IJ SOLN
1.0000 mg | Freq: Four times a day (QID) | INTRAMUSCULAR | Status: DC | PRN
  Administered 2015-11-05: 1 mg via INTRAVENOUS
  Filled 2015-11-04: qty 1

## 2015-11-04 MED ORDER — LORAZEPAM 2 MG/ML IJ SOLN
1.0000 mg | Freq: Once | INTRAMUSCULAR | Status: AC
  Administered 2015-11-04: 1 mg via INTRAVENOUS
  Filled 2015-11-04: qty 1

## 2015-11-04 MED ORDER — ALPRAZOLAM 0.5 MG PO TABS
0.5000 mg | ORAL_TABLET | Freq: Three times a day (TID) | ORAL | Status: DC
  Administered 2015-11-04 – 2015-11-07 (×9): 0.5 mg via ORAL
  Filled 2015-11-04 (×10): qty 1

## 2015-11-04 MED ORDER — MORPHINE SULFATE (PF) 2 MG/ML IV SOLN
1.0000 mg | INTRAVENOUS | Status: DC | PRN
  Administered 2015-11-04 – 2015-11-06 (×10): 2 mg via INTRAVENOUS
  Filled 2015-11-04 (×10): qty 1

## 2015-11-04 MED ORDER — BUDESONIDE 0.5 MG/2ML IN SUSP
0.5000 mg | Freq: Two times a day (BID) | RESPIRATORY_TRACT | Status: DC
  Administered 2015-11-04 – 2015-11-07 (×7): 0.5 mg via RESPIRATORY_TRACT
  Filled 2015-11-04 (×8): qty 2

## 2015-11-04 MED ORDER — METHYLPREDNISOLONE SODIUM SUCC 125 MG IJ SOLR
125.0000 mg | Freq: Once | INTRAMUSCULAR | Status: AC
  Administered 2015-11-04: 125 mg via INTRAVENOUS
  Filled 2015-11-04: qty 2

## 2015-11-04 MED ORDER — ALBUTEROL SULFATE (2.5 MG/3ML) 0.083% IN NEBU
INHALATION_SOLUTION | RESPIRATORY_TRACT | Status: AC
  Administered 2015-11-04: 5 mg via RESPIRATORY_TRACT
  Filled 2015-11-04: qty 6

## 2015-11-04 MED ORDER — ARFORMOTEROL TARTRATE 15 MCG/2ML IN NEBU
15.0000 ug | INHALATION_SOLUTION | Freq: Two times a day (BID) | RESPIRATORY_TRACT | Status: DC
  Administered 2015-11-04 – 2015-11-07 (×6): 15 ug via RESPIRATORY_TRACT
  Filled 2015-11-04 (×7): qty 2

## 2015-11-04 MED ORDER — PANTOPRAZOLE SODIUM 40 MG IV SOLR
40.0000 mg | INTRAVENOUS | Status: DC
  Administered 2015-11-04: 40 mg via INTRAVENOUS
  Filled 2015-11-04 (×2): qty 40

## 2015-11-04 MED ORDER — OXYCODONE HCL 5 MG PO TABS
15.0000 mg | ORAL_TABLET | ORAL | Status: DC
  Administered 2015-11-04 – 2015-11-07 (×17): 15 mg via ORAL
  Filled 2015-11-04 (×17): qty 3

## 2015-11-04 MED ORDER — SODIUM CHLORIDE 0.9 % IV SOLN: 250.0000 mL | INTRAVENOUS | Status: DC | PRN

## 2015-11-04 MED ORDER — IPRATROPIUM BROMIDE 0.02 % IN SOLN
RESPIRATORY_TRACT | Status: AC
  Filled 2015-11-04: qty 2.5

## 2015-11-04 MED ORDER — CHLORHEXIDINE GLUCONATE CLOTH 2 % EX PADS
6.0000 | MEDICATED_PAD | Freq: Every day | CUTANEOUS | Status: DC
  Administered 2015-11-05 – 2015-11-07 (×3): 6 via TOPICAL

## 2015-11-04 MED ORDER — IPRATROPIUM-ALBUTEROL 0.5-2.5 (3) MG/3ML IN SOLN
3.0000 mL | Freq: Four times a day (QID) | RESPIRATORY_TRACT | Status: DC | PRN
  Administered 2015-11-05 – 2015-11-06 (×3): 3 mL via RESPIRATORY_TRACT
  Filled 2015-11-04 (×2): qty 3
  Filled 2015-11-04: qty 15

## 2015-11-04 MED ORDER — FENTANYL CITRATE (PF) 100 MCG/2ML IJ SOLN
50.0000 ug | Freq: Once | INTRAMUSCULAR | Status: AC
Start: 2015-11-04 — End: 2015-11-04
  Administered 2015-11-04: 50 ug via INTRAVENOUS

## 2015-11-04 MED ORDER — ALBUTEROL SULFATE (2.5 MG/3ML) 0.083% IN NEBU
5.0000 mg | INHALATION_SOLUTION | Freq: Once | RESPIRATORY_TRACT | Status: AC
  Administered 2015-11-04: 5 mg via RESPIRATORY_TRACT

## 2015-11-04 NOTE — Progress Notes (Signed)
Patient is currently on [facility]3L[facility] and is in no distress. All vitals are stable. BIPAP is not needed at this time but is on standby.

## 2015-11-04 NOTE — ED Notes (Signed)
X-ray at bedside

## 2015-11-04 NOTE — Progress Notes (Signed)
Pt placed on droplet precautions pending RVP and influenza swab results.  Pt educated.

## 2015-11-04 NOTE — ED Notes (Addendum)
Pt arrives from home. Pt is unable to speak rt extreme SOB. Pt brought back immediately to a room and started on a duoneb. Respiratory and Dr. Fredderick PhenixBelfi called and responded.

## 2015-11-04 NOTE — Progress Notes (Signed)
CRITICAL VALUE ALERT  Critical value received:  + MRSA nasal swab  Date of notification:  11/04/2015  Time of notification:  10:31 PM  Critical value read back:Yes.    Nurse who received alert:  Terrilyn SaverHopper, Marializ Ferrebee Anderson  MD notified (1st page):  Dr. Jamison NeighborNestor  Time of first page:  10:32 PM  MD notified (2nd page):  Time of second page:  Responding MD:  Dr. Jamison NeighborNestor  Time MD responded:  10:32 PM

## 2015-11-04 NOTE — ED Provider Notes (Signed)
CSN: [id number]     Arrival date & time 11/04/15  [id number] History   First MD Initiated Contact with Patient 11/04/15 [phone]     Chief Complaint  Patient presents with  . Shortness of Breath     (Consider location/radiation/quality/duration/timing/severity/associated sxs/prior Treatment) HPI Comments: Patient presents with respiratory distress. She has a history of COPD as well as vocal cord paralysis. She's had to have past intubations. She also required a tracheostomy. She's been reported to be difficult intubation. History is limited due to her respiratory distress but she reports worsening shortness of breath since yesterday. She uses nebulizer treatments at home. She doesn't report any recent fevers. Level V caveat due to her respiratory distress.  Patient is a 40 y.o. female presenting with shortness of breath.  Shortness of Breath   Past Medical History  Diagnosis Date  . Asthma   . Anxiety   . Bronchitis   . Heart murmur   . Shortness of breath   . Hypertension   . Vocal cord dysfunction   . Chronic narcotic dependence [facility](HCC)    Past Surgical History  Procedure Laterality Date  . Spinal fusion  11/2012    [facility] Dr. Mayford KnifeWilliams  . Tracheostomy  December 2015   Family History  Problem Relation Age of Onset  . HIV Mother   . Heart disease Father   . CVA Father   . Heart disease Other   . Emphysema Maternal Grandmother     smoked  . Asthma Sister   . Clotting disorder Sister   . Clotting disorder Maternal Grandmother   . Lung cancer Maternal Grandmother     smoked   Social History  Substance Use Topics  . Smoking status: Former Smoker -- 2.00 packs/day for 17 years    Types: Cigarettes    Quit date: 06/15/2014  . Smokeless tobacco: Never Used  . Alcohol Use: 0.0 oz/week    0 Standard drinks or equivalent per week     Comment: occasional   OB History    No data available     Review of Systems  Unable to perform ROS: Severe respiratory  distress  Respiratory: Positive for shortness of breath.       Allergies  Robitussin dm; Chocolate; Suboxone; Nsaids; Rayon, purified; and Tramadol  Home Medications   Prior to Admission medications   Medication Sig Start Date End Date Taking? Authorizing Provider  acetaminophen-codeine (TYLENOL #3) 300-30 MG tablet Take 1 tablet by mouth every 8 (eight) hours as needed for moderate pain. 09/28/15   Jaclyn ShaggyEnobong Amao, MD  ALPRAZolam Prudy Feeler(XANAX) 0.5 MG tablet Take 1 tablet (0.5 mg total) by mouth 3 (three) times daily. 09/01/15   Joseph ArtJessica U Vann, DO  arformoterol (BROVANA) 15 MCG/2ML NEBU Take 2 mLs (15 mcg total) by nebulization 2 (two) times daily. 07/29/15   Vassie Lollarlos Madera, MD  budesonide (PULMICORT) 0.5 MG/2ML nebulizer solution Take 2 mLs (0.5 mg total) by nebulization 2 (two) times daily. 07/29/15   Vassie Lollarlos Madera, MD  ENSURE PLUS (ENSURE PLUS) LIQD Take 237 mLs by mouth 3 (three) times daily between meals.    Historical Provider, MD  FLUoxetine (PROZAC) 40 MG capsule Take 1 capsule (40 mg total) by mouth at bedtime. Patient taking differently: Take 40 mg by mouth at bedtime as needed (sleep).  08/06/15   Jaclyn ShaggyEnobong Amao, MD  fluticasone (FLONASE) 50 MCG/ACT nasal spray Place 2 sprays into both nostrils 2 (two) times daily. Patient not taking: Reported on 09/28/2015 04/30/15  Ripudeep Jenna Luo, MD  guaiFENesin (MUCINEX) 600 MG 12 hr tablet Take 1 tablet (600 mg total) by mouth 2 (two) times daily. Patient not taking: Reported on 09/28/2015 07/29/15   Vassie Loll, MD  ipratropium-albuterol (DUONEB) 0.5-2.5 (3) MG/3ML SOLN Take 3 mLs by nebulization every 4 (four) hours as needed (shortness of breath/wheezing). 07/29/15   Vassie Loll, MD  lactulose (CHRONULAC) 10 GM/15ML solution Take 30 mLs (20 g total) by mouth 2 (two) times daily as needed for mild constipation. Patient not taking: Reported on 09/28/2015 09/01/15   Joseph Art, DO  mometasone-formoterol (DULERA) 100-5 MCG/ACT AERO Inhale 2 puffs into  the lungs 2 (two) times daily.    Historical Provider, MD  montelukast (SINGULAIR) 10 MG tablet Take 10 mg by mouth at bedtime.    Historical Provider, MD  pantoprazole (PROTONIX) 40 MG tablet Take 1 tablet (40 mg total) by mouth daily at 12 noon. 09/01/15   Joseph Art, DO  pregabalin (LYRICA) 75 MG capsule Take 1 capsule (75 mg total) by mouth 2 (two) times daily. Reported on 08/06/2015 Patient not taking: Reported on 09/28/2015 08/06/15   Jaclyn Shaggy, MD  QUEtiapine (SEROQUEL) 100 MG tablet Take 1 tablet (100 mg total) by mouth 2 (two) times daily. 09/01/15   Joseph Art, DO  zolpidem (AMBIEN) 10 MG tablet Take 10 mg by mouth at bedtime as needed for sleep. Reported on 09/28/2015 04/30/15   Historical Provider, MD   BP 104/62 mmHg  Pulse 98  Resp 24  SpO2 100% Physical Exam  Constitutional: She is oriented to person, place, and time. She appears well-developed and well-nourished. She appears distressed.  HENT:  Head: Normocephalic and atraumatic.  Eyes: Pupils are equal, round, and reactive to light.  Neck: Normal range of motion. Neck supple.  Cardiovascular: Normal rate, regular rhythm and normal heart sounds.   Pulmonary/Chest: She is in respiratory distress. She has wheezes. She has no rales. She exhibits no tenderness.  Patient is very anxious. She has increased respiratory rate with increased work of breathing. She has diminished breath sounds bilaterally with wheezing bilaterally as well as expiratory stridor. She has a inspiratory stridor when she gets more anxious.  Abdominal: Soft. Bowel sounds are normal. There is no tenderness. There is no rebound and no guarding.  Musculoskeletal: Normal range of motion. She exhibits no edema.  Lymphadenopathy:    She has no cervical adenopathy.  Neurological: She is alert and oriented to person, place, and time.  Skin: Skin is warm and dry. No rash noted.  Psychiatric: She has a normal mood and affect.    ED Course  Procedures  (including critical care time) Labs Review Results for orders placed or performed during the hospital encounter of 11/04/15  Basic metabolic panel  Result Value Ref Range   Sodium 139 135 - 145 mmol/L   Potassium 3.6 3.5 - 5.1 mmol/L   Chloride 106 101 - 111 mmol/L   CO2 20 (L) 22 - 32 mmol/L   Glucose, Bld 101 (H) 65 - 99 mg/dL   BUN 6 6 - 20 mg/dL   Creatinine, Ser 1.61 0.44 - 1.00 mg/dL   Calcium 9.6 8.9 - 09.6 mg/dL   GFR calc non Af Amer >60 >60 mL/min   GFR calc Af Amer >60 >60 mL/min   Anion gap 13 5 - 15  CBC with Differential  Result Value Ref Range   WBC 8.2 4.0 - 10.5 K/uL   RBC 4.77 3.87 - 5.11  MIL/uL   Hemoglobin 11.4 (L) 12.0 - 15.0 g/dL   HCT 16.1 09.6 - 04.5 %   MCV 75.9 (L) 78.0 - 100.0 fL   MCH 23.9 (L) 26.0 - 34.0 pg   MCHC 31.5 30.0 - 36.0 g/dL   RDW 40.9 (H) 81.1 - 91.4 %   Platelets 487 (H) 150 - 400 K/uL   Neutrophils Relative % 66 %   Neutro Abs 5.4 1.7 - 7.7 K/uL   Lymphocytes Relative 26 %   Lymphs Abs 2.1 0.7 - 4.0 K/uL   Monocytes Relative 4 %   Monocytes Absolute 0.3 0.1 - 1.0 K/uL   Eosinophils Relative 3 %   Eosinophils Absolute 0.2 0.0 - 0.7 K/uL   Basophils Relative 1 %   Basophils Absolute 0.0 0.0 - 0.1 K/uL  Brain natriuretic peptide  Result Value Ref Range   B Natriuretic Peptide 8.4 0.0 - 100.0 pg/mL  hCG, quantitative, pregnancy  Result Value Ref Range   hCG, Beta Chain, Quant, S <1 <5 mIU/mL  I-stat troponin, ED  Result Value Ref Range   Troponin i, poc 0.00 0.00 - 0.08 ng/mL   Comment 3           Dg Chest Port 1 View  11/04/2015  CLINICAL DATA:  40 year old female with a history of shortness of breath. EXAM: PORTABLE CHEST 1 VIEW COMPARISON:  09/23/2015, 08/26/2015 FINDINGS: Cardiomediastinal silhouette unchanged, partially obscured by overlying defibrillator pads. Low lung volumes. Interstitial opacities. Blunting of the right costophrenic angle. No pneumothorax. No displaced fracture. IMPRESSION: Low lung volumes with  ill-defined interstitial opacities of the lower lungs concerning for pneumonitis or edema. Defibrillator pads. Signed, Yvone Neu. Loreta Ave, DO [facility] Electronically Signed   By: Gilmer Mor D.O.   On: 11/04/2015 12:03      Imaging Review Dg Chest Port 1 View  11/04/2015  CLINICAL DATA:  40 year old female with a history of shortness of breath. EXAM: PORTABLE CHEST 1 VIEW COMPARISON:  09/23/2015, 08/26/2015 FINDINGS: Cardiomediastinal silhouette unchanged, partially obscured by overlying defibrillator pads. Low lung volumes. Interstitial opacities. Blunting of the right costophrenic angle. No pneumothorax. No displaced fracture. IMPRESSION: Low lung volumes with ill-defined interstitial opacities of the lower lungs concerning for pneumonitis or edema. Defibrillator pads. Signed, Yvone Neu. Loreta Ave, DO [facility] Radiology Electronically Signed   By: Gilmer Mor D.O.   On: 11/04/2015 12:03   I have personally reviewed and evaluated these images and lab results as part of my medical decision-making.   EKG Interpretation   Date/Time:  Wednesday November 04 2015 09:40:54 EDT Ventricular Rate:  99 PR Interval:  163 QRS Duration: 91 QT Interval:  353 QTC Calculation: 453 R Axis:   63 Text Interpretation:  Sinus rhythm Baseline wander in lead(s) I II III aVR  aVL aVF V1 V4 Confirmed by Kush Farabee  MD, Trevar Boehringer ([id number]) on 11/04/2015 3:09:16  PM      MDM   Final diagnoses:  Respiratory distress    Patient presenting respiratory distress. She had increased work of breathing with stridor and diminished breath sounds bilaterally. She also had wheezing bilaterally. She was immediately started on nebulizer treatments. She had several albuterol and Atrovent nebulizer treatments. She was given magnesium and Solu-Medrol. She seemed to have a large anxiety component and was treated with Ativan and  fentanyl as well. She had waxing and waning improvement. At times she would have decompensation and severe stridor. I did reach the point  that I felt she was going to need to be intubated. Airway equipment was set up. Patient initially stated that she did not want to try BiPAP because it tended make her more anxious in her breathing worse. However when I explained that we may have to intubate her, she agreed to try the BiPAP. She was placed on BiPAP and after 30-45 minutes she did seem to relax a little bit and her breathing improved. She never had hypoxia that was consistently tachypnea. She ultimately had improvement of her respiratory effort and improvement of the tachypnea on the BiPAP. Her chest x-ray does not show evidence of pneumonia. I consulted with pulmonary critical care who will admit the patient.  CRITICAL CARE Performed by: Seraj Dunnam Total critical care time: 90 minutes Critical care time was exclusive of separately billable procedures and treating other patients. Critical care was necessary to treat or prevent imminent or life-threatening deterioration. Critical care was time spent personally by me on the following activities: development of treatment plan with patient and/or surrogate as well as nursing, discussions with consultants, evaluation of patient's response to treatment, examination of patient, obtaining history from patient or surrogate, ordering and performing treatments and interventions, ordering and review of laboratory studies, ordering and review of radiographic studies, pulse oximetry and re-evaluation of patient's condition.     Rolan Bucco, MD 11/04/15 [phone]

## 2015-11-04 NOTE — Progress Notes (Signed)
Pt MRSA nasal swab +.  Pt placed on contact isolation, order set ordered per protocol, and patient educated

## 2015-11-04 NOTE — ED Notes (Signed)
Pt states she will attempt bipap. We will attempt bipap prior to intubation.

## 2015-11-04 NOTE — Progress Notes (Signed)
Pt transported via bipap no distress noted, upon arrival to room pt request to come off bipap. Placed on 4lpm [place] no distress at this time. bipap in room on standby.

## 2015-11-04 NOTE — H&P (Signed)
PULMONARY / CRITICAL CARE MEDICINE   Name: Julie Kerr MRN: [id number] DOB: 01/22/1976    ADMISSION DATE:  11/04/2015 CONSULTATION DATE:    REFERRING MD:  ED  CHIEF COMPLAINT:  Shortness of breath  HISTORY OF PRESENT ILLNESS:  Julie Kerr, Julie Kerr is a 40 year old female with known history of generalized anxiety disorder, panic attacks, chronic Asthma, bronchitis disorder, polysubstance abuse,and severe upper airway cough syndrome with clinical vaso-constiction.  Patient was intubated several times in the past for vocal cord dysfunction and even was trached for the same.  Patient states that she just came after her sister's funeral in [place] who died of Stage -4 cancer.  She reports that she usually takes her medication everyday and ran out of all her medication.  She was brought by EMS on 3/22 [facility] because she was unable to speak and extremely short of breath.  Patient was given duoneb and she received 2mg  of ativan.  She responded very well to ativan  And wanted to attempt BiPAP prior to intubation. Patient has been flagged as a  difficult airway and therefore PCCM team was called to admit.   PAST MEDICAL HISTORY :  She  has a past medical history of Asthma; Anxiety; Bronchitis; Heart murmur; Shortness of breath; Hypertension; Vocal cord dysfunction; and Chronic narcotic dependence (HCC).  PAST SURGICAL HISTORY: She  has past surgical history that includes Spinal fusion (11/2012) and Tracheostomy (December 2015).  Allergies  Allergen Reactions  . Robitussin Dm [Dextromethorphan-Guaifenesin] Nausea And Vomiting  . Chocolate Hives  . Suboxone [Buprenorphine Hcl-Naloxone Hcl] Other (See Comments)    Aggressive behavior  . Nsaids Hives  . Rayon, Purified Hives  . Tramadol Hives    No current facility-administered medications on file prior to encounter.   Current Outpatient Prescriptions on File Prior to Encounter  Medication Sig  . acetaminophen-codeine (TYLENOL #3) 300-30 MG tablet  Take 1 tablet by mouth every 8 (eight) hours as needed for moderate pain.  Marland Kitchen. ALPRAZolam (XANAX) 0.5 MG tablet Take 1 tablet (0.5 mg total) by mouth 3 (three) times daily.  Marland Kitchen. arformoterol (BROVANA) 15 MCG/2ML NEBU Take 2 mLs (15 mcg total) by nebulization 2 (two) times daily.  . budesonide (PULMICORT) 0.5 MG/2ML nebulizer solution Take 2 mLs (0.5 mg total) by nebulization 2 (two) times daily.  Marland Kitchen. ENSURE PLUS (ENSURE PLUS) LIQD Take 237 mLs by mouth 3 (three) times daily between meals.  Marland Kitchen. FLUoxetine (PROZAC) 40 MG capsule Take 1 capsule (40 mg total) by mouth at bedtime. (Patient taking differently: Take 40 mg by mouth at bedtime as needed (sleep). )  . fluticasone (FLONASE) 50 MCG/ACT nasal spray Place 2 sprays into both nostrils 2 (two) times daily. (Patient not taking: Reported on 09/28/2015)  . guaiFENesin (MUCINEX) 600 MG 12 hr tablet Take 1 tablet (600 mg total) by mouth 2 (two) times daily. (Patient not taking: Reported on 09/28/2015)  . ipratropium-albuterol (DUONEB) 0.5-2.5 (3) MG/3ML SOLN Take 3 mLs by nebulization every 4 (four) hours as needed (shortness of breath/wheezing).  Marland Kitchen. lactulose (CHRONULAC) 10 GM/15ML solution Take 30 mLs (20 g total) by mouth 2 (two) times daily as needed for mild constipation. (Patient not taking: Reported on 09/28/2015)  . mometasone-formoterol (DULERA) 100-5 MCG/ACT AERO Inhale 2 puffs into the lungs 2 (two) times daily.  . montelukast (SINGULAIR) 10 MG tablet Take 10 mg by mouth at bedtime.  . pantoprazole (PROTONIX) 40 MG tablet Take 1 tablet (40 mg total) by mouth daily at 12 noon.  .Marland Kitchen  pregabalin (LYRICA) 75 MG capsule Take 1 capsule (75 mg total) by mouth 2 (two) times daily. Reported on 08/06/2015 (Patient not taking: Reported on 09/28/2015)  . QUEtiapine (SEROQUEL) 100 MG tablet Take 1 tablet (100 mg total) by mouth 2 (two) times daily.  Marland Kitchen zolpidem (AMBIEN) 10 MG tablet Take 10 mg by mouth at bedtime as needed for sleep. Reported on 09/28/2015    FAMILY  HISTORY:  Her indicated that her mother is deceased. She indicated that her father is deceased. She indicated that her other is deceased.   SOCIAL HISTORY: She  reports that she quit smoking about 16 months ago. Her smoking use included Cigarettes. She has a 34 pack-year smoking history. She has never used smokeless tobacco. She reports that she drinks alcohol. She reports that she does not use illicit drugs.  REVIEW OF SYSTEMS:   Unable to obtain  SUBJECTIVE:   Patient  States that she was feeling shortness of breath and unable to speak since this morning at approximately 08:30 am.  She just returned from her sisters funeral and ran out of her asthma medication.  VITAL SIGNS: BP 104/57 mmHg  Pulse 103  Resp 16  SpO2 100%  HEMODYNAMICS:    VENTILATOR SETTINGS: Vent Mode:  [-] BIPAP FiO2 (%):  [60 %] 60 % Set Rate:  [16 bmp] 16 bmp PEEP:  [5 cmH20] 5 cmH20  INTAKE / OUTPUT:    PHYSICAL EXAMINATION: General:  Morbidly obese black female, on BiPAP Neuro:  Awake, alert, oriented,follows command HEENT: atraumatic, normocephalic, white sclera, no discharge Cardiovascular:  S1S2, tachycardic, no MRG Lungs: wheezes bilaterally, no crackles, rales noted Abdomen:  Obese, soft, nontender Musculoskeletal: no inflammation, deformity note Skin:  Intact.no ecchymosis,rash erythema noted  LABS:  BMET  Recent Labs Lab 11/04/15 1005  NA 139  K 3.6  CL 106  CO2 20*  BUN 6  CREATININE 0.83  GLUCOSE 101*    Electrolytes  Recent Labs Lab 11/04/15 1005  CALCIUM 9.6    CBC  Recent Labs Lab 11/04/15 1005  WBC 8.2  HGB 11.4*  HCT 36.2  PLT 487*    Coag's No results for input(s): APTT, INR in the last 168 hours.  Sepsis Markers No results for input(s): LATICACIDVEN, PROCALCITON, O2SATVEN in the last 168 hours.  ABG No results for input(s): PHART, PCO2ART, PO2ART in the last 168 hours.  Liver Enzymes No results for input(s): AST, ALT, ALKPHOS, BILITOT, ALBUMIN  in the last 168 hours.  Cardiac Enzymes No results for input(s): TROPONINI, PROBNP in the last 168 hours.  Glucose No results for input(s): GLUCAP in the last 168 hours.  Imaging Dg Chest Port 1 View  11/04/2015  CLINICAL DATA:  40 year old female with a history of shortness of breath. EXAM: PORTABLE CHEST 1 VIEW COMPARISON:  09/23/2015, 08/26/2015 FINDINGS: Cardiomediastinal silhouette unchanged, partially obscured by overlying defibrillator pads. Low lung volumes. Interstitial opacities. Blunting of the right costophrenic angle. No pneumothorax. No displaced fracture. IMPRESSION: Low lung volumes with ill-defined interstitial opacities of the lower lungs concerning for pneumonitis or edema. Defibrillator pads. Signed, Yvone Neu. Loreta Ave, DO [facility] Radiology Electronically Signed   By: Gilmer Mor D.O.   On: 11/04/2015 12:03     STUDIES:  none  CULTURES:  3/22 BC>> 3/22 UC>> 3/22 Respiratory panel>>  ANTIBIOTICS: none  SIGNIFICANT EVENTS: none  LINES/TUBES: none  DISCUSSION:  41 yo with history of Asthma; Anxiety; Bronchitis; Heart murmur; Shortness of breath; Hypertension; Vocal cord dysfunction;  and Chronic narcotic dependence presents with vocal cord dysfunction and shortness of breath.  ASSESSMENT / PLAN:  PULMONARY A: Asthama exacerbation Vocal cord dysfuntion P:  Continue BiPAP for sats >90% Follow ABG Follow CXR  Solumedrol once Continue Budesonide & Brovan neb bid Duoneb prn   CARDIOVASCULAR A:  No active issues P:   Monitor v/s Continuous telemetry  RENAL A:   No active issues P:   Monitor i/o Replace lytes  GASTROINTESTINAL A:   No active issues P:   protonix for GI prophylaxis  HEMATOLOGIC A:   No active issues  P:  Continue Heparin SCD'S  INFECTIOUS A:   No active issues P:   Follow cultures Trend cbc  ENDOCRINE A:   No active issue P:   Check BG  intermittently  NEUROLOGIC A:   Generalised anxiety disorder Panic attacks  Hx of Polysubstance abuse P:   RASS goal: 0 Received lorazepam in ED Ativan- home dose Ativan prn Hold lyrica/ambien   FAMILY  - Updates: No family was present  - Inter-disciplinary family meet or Palliative Care meeting due by:  11/12/15    Bincy Varughese,AG-ACNP Pulmonary and Critical Care Medicine [employer] Pager: [phone]  11/04/2015, 1:18 PM  PCCM Attending Note: Patient seen and examined with nurse practitioner. Please refer to her admission H&P which I reviewed in detail. Patient has undergone significant stress recently And has been out of her Xanax for approximately 2 days. Questionable interstitial prominence on x-ray imaging could suggest an atypical infectious etiology  However the patient denies any fever or chills. She denies any sore throat. She does report some mild sinus congestion. Patient reports she has been compliant with her and home nebulizers. Physical exam reveals inspiratory stridor but overall normal work of breathing on BiPAP at the time of my exam. Suspect this is vocal cord dysfunction as is noted in her history. Continue to monitor the patient very closely while using anxiolytics//benzodiazepinesand BiPAP for respiratory support.  I have spent a total of 32 minutes of critical care time today caring for the patient and reviewing the patient's electronic medical record.  Donna Christen Jamison Neighbor, M.D. [facility] Pulmonary & Critical Care Pager:  [phone] After 3pm or if no response, call [phone] 3:57 PM 11/04/2015

## 2015-11-04 NOTE — Progress Notes (Addendum)
PCCM INTERVAL PROGRESS NOTE  Evaluated patient upon arrival to ICU. Now off BiPAP and vocal card spas seems to have improved. She is speaking full sentences and SOB is markedly improved. She would like to eat. Will order diet now, but made deal that she can only eat if able to stay off BiPAP for 2 hours (830PM). She is also requesting pain medication be added as pain is big contributor to her VCD. Will add home dose oxycodone and PRN morphine for breakthrough pain as she takes at home. Will add Seroquel as well.   Additional critical care time 20 mins  Joneen RoachPaul Kennley Schwandt, AGACNP-BC [facility] Pulmonology/Critical Care Pager [phone] or [phone]  11/04/2015 7:18 PM

## 2015-11-04 NOTE — ED Notes (Signed)
Materials and meds gathered for intubation.

## 2015-11-05 DIAGNOSIS — F410 Panic disorder [episodic paroxysmal anxiety] without agoraphobia: Secondary | ICD-10-CM

## 2015-11-05 DIAGNOSIS — J383 Other diseases of vocal cords: Principal | ICD-10-CM

## 2015-11-05 LAB — BASIC METABOLIC PANEL
ANION GAP: 11 (ref 5–15)
BUN: 11 mg/dL (ref 6–20)
CHLORIDE: 103 mmol/L (ref 101–111)
CO2: 23 mmol/L (ref 22–32)
Calcium: 9.6 mg/dL (ref 8.9–10.3)
Creatinine, Ser: 0.84 mg/dL (ref 0.44–1.00)
GFR calc Af Amer: >60 mL/min (ref >60)
GLUCOSE: 189 mg/dL — AB (ref 65–99)
POTASSIUM: 4.3 mmol/L (ref 3.5–5.1)
Sodium: 137 mmol/L (ref 135–145)

## 2015-11-05 LAB — RAPID URINE DRUG SCREEN, HOSP PERFORMED
Amphetamines: NOT DETECTED
BENZODIAZEPINES: POSITIVE — AB
Barbiturates: NOT DETECTED
COCAINE: NOT DETECTED
Opiates: POSITIVE — AB
Tetrahydrocannabinol: NOT DETECTED

## 2015-11-05 LAB — BLOOD GAS, ARTERIAL
ACID-BASE DEFICIT: 1 mmol/L (ref 0.0–2.0)
Bicarbonate: 23.7 mEq/L (ref 20.0–24.0)
Drawn by: 419771
O2 CONTENT: 2 L/min
O2 SAT: 91.8 %
PATIENT TEMPERATURE: 98.6
PCO2 ART: 42.8 mmHg (ref 35.0–45.0)
PO2 ART: 71.7 mmHg — AB (ref 80.0–100.0)
TCO2: 25 mmol/L (ref 0–100)
pH, Arterial: 7.362 (ref 7.350–7.450)

## 2015-11-05 LAB — CBC
HCT: 30.9 % — ABNORMAL LOW (ref 36.0–46.0)
HEMOGLOBIN: 9.8 g/dL — AB (ref 12.0–15.0)
MCH: 24.1 pg — ABNORMAL LOW (ref 26.0–34.0)
MCHC: 31.7 g/dL (ref 30.0–36.0)
MCV: 76.1 fL — ABNORMAL LOW (ref 78.0–100.0)
Platelets: 423 10*3/uL — ABNORMAL HIGH (ref 150–400)
RBC: 4.06 MIL/uL (ref 3.87–5.11)
RDW: 17.8 % — AB (ref 11.5–15.5)
WBC: 12.7 10*3/uL — AB (ref 4.0–10.5)

## 2015-11-05 LAB — MAGNESIUM: Magnesium: 2.4 mg/dL (ref 1.7–2.4)

## 2015-11-05 LAB — PHOSPHORUS: Phosphorus: 3.7 mg/dL (ref 2.5–4.6)

## 2015-11-05 MED ORDER — PANTOPRAZOLE SODIUM 40 MG PO TBEC
40.0000 mg | DELAYED_RELEASE_TABLET | Freq: Every day | ORAL | Status: DC
  Administered 2015-11-05 – 2015-11-07 (×3): 40 mg via ORAL
  Filled 2015-11-05 (×3): qty 1

## 2015-11-05 MED ORDER — ZOLPIDEM TARTRATE 5 MG PO TABS: 5.0000 mg | ORAL_TABLET | Freq: Every evening | ORAL | Status: DC | PRN

## 2015-11-05 MED ORDER — PREGABALIN 75 MG PO CAPS
75.0000 mg | ORAL_CAPSULE | Freq: Two times a day (BID) | ORAL | Status: DC
  Administered 2015-11-05 – 2015-11-07 (×5): 75 mg via ORAL
  Filled 2015-11-05 (×5): qty 1

## 2015-11-05 MED ORDER — PREGABALIN 50 MG PO CAPS: 75.0000 mg | ORAL_CAPSULE | Freq: Every day | ORAL | Status: DC

## 2015-11-05 NOTE — Progress Notes (Signed)
Upon admission patient has been very tearful because of her sisters passing a few weeks ago.  She spoke with me about feeling guilty because she was not there for her sister before she died.  She also talked about her son being with his father at this time and how he wanted to come home to be with her but the father will not let him come be with his mom.  She also talked about how she has panic attacks and when her sister died she ran out of her medications at home and missed appointments because of her sisters death.  She said that she recently just got back from [place] where her sister lived and it just "hit her" that her sister was gone and that she would not see her again.

## 2015-11-05 NOTE — Progress Notes (Signed)
Patient report [address] to [address] Patient awaits to be transported by the NT via wheel chair with no telemetry.

## 2015-11-05 NOTE — Progress Notes (Signed)
Nutrition Brief Note  Pt was identified for MST.  Pt's weight stable. BMI of 39 pt is classified as obese based on current BMI.  Per chart pt consuming 100% of meals. Pt is currently on a heart healthy diet.   Pt discussed in interdisciplinary rounds, orders to transfer to Med Surg unit.   Labs reviewed. Medications reviewed.   No nutritional interventions warrant at this time. If nutritional needs arise consult RD.   Brynda RimBrittany Josephine Rudnick, Dietetic Intern Pager: [phone]

## 2015-11-05 NOTE — Progress Notes (Signed)
NURSING PROGRESS NOTE  Julie Schildermanda Lineberry [id number] Transfer Data: 11/05/2015 6:03 PM Attending Provider: Roslynn AmbleJennings E Nestor, MD WUJ:[id number]:Enobong, Venetia NightAmao, MD Code Status: full  Julie Kerr is a 40 y.o. female patient transferred from [facility]  -No acute distress noted.  -No complaints of shortness of breath.  -No complaints of chest pain.   Cardiac Monitoring: Box # 15 in place. Cardiac monitor yields:normal sinus rhythm.  Blood pressure 131/70, pulse 91, temperature 98.6 F (37 C), temperature source Oral, resp. rate 20, height 5\' 2"  (1.575 m), weight 97.1 kg (214 lb 1.1 oz), SpO2 99 %.   IV Fluids:  IV in place, occlusive dsg intact without redness, IV cath hand left, condition patent and no redness none.   Allergies:  Robitussin dm; Chocolate; Suboxone; Nsaids; Rayon, purified; and Tramadol  Past Medical History:   has a past medical history of Asthma; Anxiety; Bronchitis; Heart murmur; Shortness of breath; Hypertension; Vocal cord dysfunction; and Chronic narcotic dependence (HCC).  Past Surgical History:   has past surgical history that includes Spinal fusion (11/2012) and Tracheostomy (December 2015).  Social History:   reports that she quit smoking about 16 months ago. Her smoking use included Cigarettes. She has a 34 pack-year smoking history. She has never used smokeless tobacco. She reports that she drinks alcohol. She reports that she does not use illicit drugs.  Skin: intact  Patient/Family orientated to room. Information packet given to patient/family. Admission inpatient armband information verified with patient/family to include name and date of birth and placed on patient arm. Side rails up x 2, fall assessment and education completed with patient/family. Patient/family able to verbalize understanding of risk associated with falls and verbalized understanding to call for assistance before getting out of bed. Call light within reach. Patient/family able to voice and demonstrate  understanding of unit orientation instructions.    Will continue to evaluate and treat per MD orders.

## 2015-11-05 NOTE — Progress Notes (Signed)
PULMONARY / CRITICAL CARE MEDICINE   Name: Julie Kerr MRN: [id number] DOB: 04/20/1976    ADMISSION DATE:  11/04/2015 CONSULTATION DATE:    REFERRING MD:  ED  CHIEF COMPLAINT:  Shortness of breath  BRIEF:  40 year old female with known history of generalized anxiety disorder, panic attacks, chronic Asthma, and vocal cord dysfunction in the setting of polysubstance abuse who presented with wheezing after the death of her sister.  She briefly needed BIPAP but then was quickly weaned to room air on the day of admission.   SUBJECTIVE:  On room air Flu negative Slept comfortably Asking for pain meds  VITAL SIGNS: BP 102/63 mmHg  Pulse 86  Temp(Src) 98.7 F (37.1 C) (Oral)  Resp 16  Ht 5\' 2"  (1.575 m)  Wt 97.1 kg (214 lb 1.1 oz)  BMI 39.14 kg/m2  SpO2 100%  HEMODYNAMICS:    VENTILATOR SETTINGS: Vent Mode:  [-] BIPAP FiO2 (%):  [50 %-60 %] 50 % Set Rate:  [16 bmp] 16 bmp PEEP:  [5 cmH20] 5 cmH20  INTAKE / OUTPUT: I/O last 3 completed shifts: In: -  Out: 1000 [Urine:1000]  PHYSICAL EXAMINATION: General:  Comfortable HENT NCAT OP clear PULM: upper airway wheeze while awake, normal respiratory effort CV: RRR, no mgr GI: BS+, soft, notnender MSK: normal bulk and tone Neuro: A&Ox4, maew  LABS:  BMET  Recent Labs Lab 11/04/15 1005 11/05/15 0320  NA 139 137  K 3.6 4.3  CL 106 103  CO2 20* 23  BUN 6 11  CREATININE 0.83 0.84  GLUCOSE 101* 189*    Electrolytes  Recent Labs Lab 11/04/15 1005 11/05/15 0320  CALCIUM 9.6 9.6  MG  --  2.4  PHOS  --  3.7    CBC  Recent Labs Lab 11/04/15 1005 11/05/15 0320  WBC 8.2 12.7*  HGB 11.4* 9.8*  HCT 36.2 30.9*  PLT 487* 423*    Coag's No results for input(s): APTT, INR in the last 168 hours.  Sepsis Markers No results for input(s): LATICACIDVEN, PROCALCITON, O2SATVEN in the last 168 hours.  ABG  Recent Labs Lab 11/05/15 0350  PHART 7.362  PCO2ART 42.8  PO2ART 71.7*    Liver Enzymes No  results for input(s): AST, ALT, ALKPHOS, BILITOT, ALBUMIN in the last 168 hours.  Cardiac Enzymes No results for input(s): TROPONINI, PROBNP in the last 168 hours.  Glucose No results for input(s): GLUCAP in the last 168 hours.  Imaging  3/22 CXR > bibasilar atelectasis vs pulmonary edema  STUDIES:  none  CULTURES:  3/22 BC>> 3/22 UC>> 3/22 Respiratory panel>> neg flu  ANTIBIOTICS: none  SIGNIFICANT EVENTS: none  LINES/TUBES: none  DISCUSSION:  40 yo with history of Asthma; Anxiety; Bronchitis; Heart murmur; Shortness of breath; Hypertension; Vocal cord dysfunction; and Chronic narcotic dependence presents with vocal cord dysfunction and shortness of breath.  As of 3/22 she has been off of BIPAP and has been resting comfortably.  This seems most consistent with a flare of her vocal cord dysfunction due to anxiety/stress.    ASSESSMENT / PLAN:  NEUROLOGIC A:   Generalised anxiety disorder > worse, primary cause of this admission Panic attacks  Hx of Polysubstance abuse Narcotic dependence P:   Ativan prn  Morphine prn Restart lyrica/ambien Continue seroquel May need psyche eval  PULMONARY A: Asthma exacerbation > less likely  Vocal cord dysfunction > explanation for wheezing Atelectasis on CXR P:  D/c BIPAP Monitor off of prednisone Continue Budesonide & Brovan neb bid  Duoneb prn Out of bed, incentive spirometry If walking, she will be ready to go home  CARDIOVASCULAR A:  No active issues P:   Monitor v/s Continuous telemetry  RENAL A:   No active issues P:   Monitor i/o Replace lytes  GASTROINTESTINAL A:   No active issues P:   protonix for GI prophylaxis  HEMATOLOGIC A:   Mild anemia without bleeding  P:  Continue Heparin SCD'S  INFECTIOUS A:   No active issues P:   Follow cultures Trend cbc  ENDOCRINE A:   No active issue P:   Check BG intermittently     FAMILY  - Updates: No family was present  -  Inter-disciplinary family meet or Palliative Care meeting due by:  11/12/15  To floor, TRH   Heber Jauca, MD [place] PCCM Pager: [phone] Cell: [phone] After 3pm or if no response, call [phone]  10:34 AM 11/05/2015

## 2015-11-05 NOTE — Care Management Note (Signed)
Case Management Note  Patient Details  Name: Adele Schildermanda Landress MRN: [id number] Date of Birth: 06/29/1976  Subjective/Objective:                    Action/Plan:  [facility]  Pt was discharged from SNF late Feb 17.  Pt is active with [facility] , has active medical and prescription insurance.  CM will continue to monitor for disposition needs   Expected Discharge Date:                  Expected Discharge Plan:  Home/Self Care  In-House Referral:  Clinical Social Work  Discharge planning Services  CM Consult  Post Acute Care Choice:    Choice offered to:     DME Arranged:    DME Agency:     HH Arranged:    HH Agency:     Status of Service:  In process, will continue to follow  Medicare Important Message Given:    Date Medicare IM Given:    Medicare IM give by:    Date Additional Medicare IM Given:    Additional Medicare Important Message give by:     If discussed at Long Length of Stay Meetings, dates discussed:    Additional Comments:  Cherylann ParrClaxton, Steffani Dionisio S, RN 11/05/2015, 10:11 AM

## 2015-11-06 DIAGNOSIS — F411 Generalized anxiety disorder: Secondary | ICD-10-CM

## 2015-11-06 DIAGNOSIS — G894 Chronic pain syndrome: Secondary | ICD-10-CM

## 2015-11-06 LAB — BASIC METABOLIC PANEL WITH GFR
Anion gap: 8 (ref 5–15)
BUN: 12 mg/dL (ref 6–20)
CO2: 26 mmol/L (ref 22–32)
Calcium: 9.3 mg/dL (ref 8.9–10.3)
Chloride: 104 mmol/L (ref 101–111)
Creatinine, Ser: 0.9 mg/dL (ref 0.44–1.00)
GFR calc Af Amer: >60 mL/min
GFR calc non Af Amer: >60 mL/min
Glucose, Bld: 141 mg/dL — ABNORMAL HIGH (ref 65–99)
Potassium: 3.9 mmol/L (ref 3.5–5.1)
Sodium: 138 mmol/L (ref 135–145)

## 2015-11-06 LAB — RESPIRATORY VIRUS PANEL
Adenovirus: NEGATIVE
INFLUENZA B 1: NEGATIVE
Influenza A: NEGATIVE
METAPNEUMOVIRUS: NEGATIVE
PARAINFLUENZA 2 A: NEGATIVE
PARAINFLUENZA 3 A: NEGATIVE
Parainfluenza 1: NEGATIVE
RESPIRATORY SYNCYTIAL VIRUS A: NEGATIVE
Respiratory Syncytial Virus B: NEGATIVE
Rhinovirus: NEGATIVE

## 2015-11-06 LAB — CBC WITH DIFFERENTIAL/PLATELET
Basophils Absolute: 0 10*3/uL (ref 0.0–0.1)
Basophils Relative: 0 %
EOS ABS: 0.1 10*3/uL (ref 0.0–0.7)
EOS PCT: 1 %
HEMATOCRIT: 31.5 % — AB (ref 36.0–46.0)
HEMOGLOBIN: 9.6 g/dL — AB (ref 12.0–15.0)
Lymphocytes Relative: 30 %
Lymphs Abs: 3.1 10*3/uL (ref 0.7–4.0)
MCH: 23.6 pg — AB (ref 26.0–34.0)
MCHC: 30.5 g/dL (ref 30.0–36.0)
MCV: 77.6 fL — ABNORMAL LOW (ref 78.0–100.0)
MONOS PCT: 5 %
Monocytes Absolute: 0.5 10*3/uL (ref 0.1–1.0)
NEUTROS PCT: 64 %
Neutro Abs: 6.7 10*3/uL (ref 1.7–7.7)
Platelets: 393 10*3/uL (ref 150–400)
RBC: 4.06 MIL/uL (ref 3.87–5.11)
RDW: 18 % — AB (ref 11.5–15.5)
WBC: 10.5 10*3/uL (ref 4.0–10.5)

## 2015-11-06 LAB — URINE CULTURE

## 2015-11-06 MED ORDER — FLUOXETINE HCL 20 MG PO TABS
40.0000 mg | ORAL_TABLET | Freq: Every day | ORAL | Status: DC
  Administered 2015-11-06: 40 mg via ORAL
  Filled 2015-11-06 (×2): qty 2

## 2015-11-06 MED ORDER — IPRATROPIUM-ALBUTEROL 0.5-2.5 (3) MG/3ML IN SOLN: 3.0000 mL | Freq: Four times a day (QID) | RESPIRATORY_TRACT | Status: DC

## 2015-11-06 MED ORDER — POLYETHYLENE GLYCOL 3350 17 G PO PACK
17.0000 g | PACK | Freq: Every day | ORAL | Status: DC
Start: 2015-11-06 — End: 2015-11-07
  Administered 2015-11-06 – 2015-11-07 (×2): 17 g via ORAL
  Filled 2015-11-06 (×2): qty 1

## 2015-11-06 MED ORDER — MONTELUKAST SODIUM 10 MG PO TABS
10.0000 mg | ORAL_TABLET | Freq: Every day | ORAL | Status: DC
  Administered 2015-11-06: 10 mg via ORAL
  Filled 2015-11-06: qty 1

## 2015-11-06 MED ORDER — IPRATROPIUM-ALBUTEROL 0.5-2.5 (3) MG/3ML IN SOLN
3.0000 mL | RESPIRATORY_TRACT | Status: DC
  Administered 2015-11-06 – 2015-11-07 (×5): 3 mL via RESPIRATORY_TRACT
  Filled 2015-11-06 (×5): qty 3

## 2015-11-06 MED ORDER — DOCUSATE SODIUM 100 MG PO CAPS: 100.0000 mg | ORAL_CAPSULE | Freq: Two times a day (BID) | ORAL | Status: DC | PRN

## 2015-11-06 MED ORDER — BENZONATATE 100 MG PO CAPS
100.0000 mg | ORAL_CAPSULE | Freq: Three times a day (TID) | ORAL | Status: DC | PRN
  Administered 2015-11-06 – 2015-11-07 (×2): 100 mg via ORAL
  Filled 2015-11-06 (×2): qty 1

## 2015-11-06 NOTE — Hospital Discharge Follow-Up (Signed)
[facility]  Update provided to Lawerance Sabalebbie Swist, RN CM.  Attempted to meet with with the patient  this morning but she was not in her room.  [facility] scheduled for 11/18/15 @ 1000 and the information was placed on the AVS.

## 2015-11-06 NOTE — Progress Notes (Signed)
[facility] PROGRESS NOTE  Julie Kerr UEA:[id number] DOB: 09/24/1975 DOA: 11/04/2015  PCP: Jaclyn ShaggyEnobong, Amao, MD  She is trying to change her PCP to Dr. Nehemiah SettlePolite  Brief HPI: 40 year old female with known history of generalized anxiety disorder, panic attacks, chronic Asthma, and vocal cord dysfunction in the setting of polysubstance abuse who presented with wheezing after the death of her sister.She briefly needed BIPAP but then was quickly weaned to room air on the day of admission.  Past medical history:  Past Medical History  Diagnosis Date  . Asthma   . Anxiety   . Bronchitis   . Heart murmur   . Shortness of breath   . Hypertension   . Vocal cord dysfunction   . Chronic narcotic dependence (HCC)     Consultants: None  Procedures: None  Antibiotics: None  Subjective: Patient feels better. Still has some wheezing. Occasional dry cough. No chest pain. Still upset at the recent loss of her sister. However, denies any suicidal or homicidal ideation. Does not feel that she needs to see a psychiatrist.  Objective: Vital Signs  Filed Vitals:   11/05/15 1500 11/05/15 1538 11/05/15 1750 11/06/15 0600  BP: 119/58  131/70 98/64  Pulse: 88  91 82  Temp:  98.1 F (36.7 C) 98.6 F (37 C) 99.5 F (37.5 C)  TempSrc:  Oral Oral Oral  Resp: 15  20 18   Height:      Weight:      SpO2: 100%  99% 100%    Intake/Output Summary (Last 24 hours) at 11/06/15 1317 Last data filed at 11/06/15 1240  Gross per 24 hour  Intake   1000 ml  Output   2253 ml  Net  -1253 ml   Filed Weights   11/04/15 2200 11/05/15 0500  Weight: 95.7 kg (210 lb 15.7 oz) 97.1 kg (214 lb 1.1 oz)    General appearance: alert, cooperative, appears stated age and no distress Resp: Scattered end expiratory wheezing. Mainly in the upper airways. Cardio: regular rate and rhythm, S1, S2 normal, no murmur, click, rub or gallop GI: soft, non-tender; bowel sounds normal; no masses,  no  organomegaly Extremities: extremities normal, atraumatic, no cyanosis or edema Neurologic: Awake and alert. Oriented 3. No focal neurological deficits.  Lab Results:  Basic Metabolic Panel:  Recent Labs Lab 11/04/15 1005 11/05/15 0320 11/06/15 0739  NA 139 137 138  K 3.6 4.3 3.9  CL 106 103 104  CO2 20* 23 26  GLUCOSE 101* 189* 141*  BUN 6 11 12   CREATININE 0.83 0.84 0.90  CALCIUM 9.6 9.6 9.3  MG  --  2.4  --   PHOS  --  3.7  --    CBC:  Recent Labs Lab 11/04/15 1005 11/05/15 0320 11/06/15 0739  WBC 8.2 12.7* 10.5  NEUTROABS 5.4  --  6.7  HGB 11.4* 9.8* 9.6*  HCT 36.2 30.9* 31.5*  MCV 75.9* 76.1* 77.6*  PLT 487* 423* 393   BNP (last 3 results)  Recent Labs  08/26/15 2025 11/04/15 1005  BNP 7.4 8.4     Recent Results (from the past 240 hour(s))  Culture, blood (routine x 2)     Status: None (Preliminary result)   Collection Time: 11/04/15  1:28 PM  Result Value Ref Range Status   Specimen Description BLOOD RIGHT ANTECUBITAL  Final   Special Requests BOTTLES DRAWN AEROBIC AND ANAEROBIC 5CC  Final   Culture NO GROWTH 2 DAYS  Final   Report  Status PENDING  Incomplete  Culture, blood (routine x 2)     Status: None (Preliminary result)   Collection Time: 11/04/15  1:39 PM  Result Value Ref Range Status   Specimen Description BLOOD RIGHT HAND  Final   Special Requests BOTTLES DRAWN AEROBIC AND ANAEROBIC 5CC  Final   Culture NO GROWTH 2 DAYS  Final   Report Status PENDING  Incomplete  Respiratory virus panel     Status: None   Collection Time: 11/04/15  8:25 PM  Result Value Ref Range Status   Respiratory Syncytial Virus A Negative Negative Final   Respiratory Syncytial Virus B Negative Negative Final   Influenza A Negative Negative Final   Influenza B Negative Negative Final   Parainfluenza 1 Negative Negative Final   Parainfluenza 2 Negative Negative Final   Parainfluenza 3 Negative Negative Final   Metapneumovirus Negative Negative Final    Rhinovirus Negative Negative Final   Adenovirus Negative Negative Final    Comment: (NOTE) Performed At: [facility] [address] [place], [place] [id number] Mila Homer MD WU:[id number]   MRSA PCR Screening     Status: Abnormal   Collection Time: 11/04/15  8:25 PM  Result Value Ref Range Status   MRSA by PCR POSITIVE (A) NEGATIVE Final    Comment:        The GeneXpert MRSA Assay (FDA approved for NASAL specimens only), is one component of a comprehensive MRSA colonization surveillance program. It is not intended to diagnose MRSA infection nor to guide or monitor treatment for MRSA infections. RESULT CALLED TO, READ BACK BY AND VERIFIED WITH: M HOPPER RN 2230 11/04/15 A BROWNING       Studies/Results: No results found.  Medications:  Scheduled: . ALPRAZolam  0.5 mg Oral TID  . arformoterol  15 mcg Nebulization BID  . budesonide (PULMICORT) nebulizer solution  0.5 mg Nebulization BID  . Chlorhexidine Gluconate Cloth  6 each Topical Q0600  . FLUoxetine  40 mg Oral QHS  . heparin  5,000 Units Subcutaneous 3 times per day  . montelukast  10 mg Oral QHS  . mupirocin ointment  1 application Nasal BID  . oxyCODONE  15 mg Oral Q4H  . pantoprazole  40 mg Oral Daily  . polyethylene glycol  17 g Oral Daily  . pregabalin  75 mg Oral BID  . QUEtiapine  100 mg Oral QHS   Continuous:  NFA:OZHYQM chloride, benzonatate, docusate sodium, ipratropium-albuterol, LORazepam, morphine injection, zolpidem  Assessment/Plan:  Active Problems:   Chronic pain syndrome   Upper airway cough syndrome, severe, with clinical VCD   Generalized anxiety disorder   Panic attacks   Polysubstance abuse   Chronic asthmatic bronchitis (HCC)   Vocal cord dysfunction   Acute respiratory failure with hypoxia (HCC)    Acute respiratory failure due to vocal cord dysfunction Patient was initially admitted to the intensive care unit. She required BiPAP very briefly. She has improved  significantly. She feels better. No infiltrates noted on chest x-ray. Continue current management. Mobilize.  History of generalized anxiety disorder and panic attacks This is a long-standing history for the patient. Made worse recently due to recent loss of her sister. Apparently sister was living in [place] and died due to stage IV cancer. Continue with the patient's anxiolytics and antidepressants. Patient denied any suicidal ideation. She feels better this morning.  History of chronic pain with narcotic dependence Previous notes reviewed. Patient apparently has a long-standing history of narcotic misuse. During her  last hospitalization she apparently faked a fall in order to receive pain medications. Patient mentions that she missed her scheduled appointment in March and as a result has run out of her pain medications. [facility].  Microcytic anemia Hemoglobin is stable. No overt bleeding. Outpatient follow-up and management.   DVT Prophylaxis: Subcutaneous heparin    Code Status: Full code  Family Communication: Discussed with the patient  Disposition Plan: Mobilize. Anticipate discharge tomorrow.    LOS: 2 days   [facility]  [facility] Pager [phone] 11/06/2015, 1:17 PM  If 7PM-7AM, please contact night-coverage at [email or web address], password [facility]

## 2015-11-06 NOTE — Consult Note (Signed)
   [facility]   11/06/2015  Adele Schildermanda Koc 06/22/1976 [id number] Patient was assessed for [facility] Care Management for community services. [facility] with RN [facility] and Social worker.  Chart review indicates that [facility] managers were having difficulty maintaining contact with her.  Spoke with patient regarding being restarted with [facility]. Patient states she has been in [place] since the beginning of the month.  She states she went to visit her sister and states, "My sister died, I got up there just in time....but she died [sobbing]. [place] [facility] [place] less than 24 hours, I am admitted to the hospital."  Patient consent to ongoing follow up post hospital.  She states that she has a phone but often runs out of minutes.  She states, "I have minutes now but my charger is at home today and I'm not able to call my kids from my phone because it dead."   Consent form already signed and on file.   Of note, [facility] Care [facility] services does not replace or interfere with any services that are arranged by inpatient case management or social work. For additional questions or referrals please contact: Charlesetta ShanksVictoria Kenedee Molesky, RN BSN CCM [facility] Lia[facility]  [phone] business mobile phone Toll free office [phone]

## 2015-11-06 NOTE — Care Management Note (Addendum)
Case Management Note  Patient Details  Name: Julie Kerr MRN: [id number] Date of Birth: 12/16/1975  Subjective/Objective:                CM spoke with Robyne PeersJane Brazeau RN CM [facility] coordinator [name]. [facility] care team, Erskine SquibbJane states she will make follow up appointment and place it in AVS. [facility] also [facility]. Erskine SquibbJane states that patient was recently in [place] at her sister's funeral and did not take her anxiety medications (may have forgotten to take them with her). Patient also has a history of chronic pain for which [facility]. Patient has been released from [facility] in the past.  Per history in chart review patient [facility] after missing at least 4 home appointments, and has been discahraged from patient outreach for inability to be contacted.    Action/Plan:  CM will continue to follow for discharge planning needs. Patient will history of noncompliance in community setting.   Expected Discharge Date:                  Expected Discharge Plan:  Home/Self Care  In-House Referral:  Clinical Social Work  Discharge planning Services  CM Consult  Post Acute Care Choice:    Choice offered to:     DME Arranged:    DME Agency:     HH Arranged:    HH Agency:     Status of Service:  In process, will continue to follow  Medicare Important Message Given:    Date Medicare IM Given:    Medicare IM give by:    Date Additional Medicare IM Given:    Additional Medicare Important Message give by:     If discussed at Long Length of Stay Meetings, dates discussed:    Additional Comments:  Lawerance SabalDebbie Rogan Ecklund, RN 11/06/2015, 12:36 PM

## 2015-11-06 NOTE — Care Management Important Message (Signed)
Important Message  Patient Details  Name: Adele Schildermanda Blaschke MRN: [id number] Date of Birth: 04/14/1976   Medicare Important Message Given:  Yes    Lawerance Sabalebbie Tynisa Vohs, RN 11/06/2015, 12:47 PMImportant Message  Patient Details  Name: Adele Schildermanda Gaillard MRN: [id number] Date of Birth: 08/01/1976   Medicare Important Message Given:  Yes    Lawerance Sabalebbie Mykeisha Dysert, RN 11/06/2015, 12:47 PM

## 2015-11-07 MED ORDER — ALPRAZOLAM 0.5 MG PO TABS: 0.5000 mg | ORAL_TABLET | Freq: Three times a day (TID) | ORAL | Status: DC

## 2015-11-07 MED ORDER — OXYCODONE HCL 15 MG PO TABS: 15.0000 mg | ORAL_TABLET | ORAL | Status: DC

## 2015-11-07 MED ORDER — BENZONATATE 100 MG PO CAPS: 100.0000 mg | ORAL_CAPSULE | Freq: Three times a day (TID) | ORAL | Status: DC | PRN

## 2015-11-07 NOTE — Progress Notes (Signed)
Nsg Discharge Note  Admit Date:  11/04/2015 Discharge date: 11/07/2015   Julie Kerr to be D/C'd Home per MD order.  AVS completed.  Copy for chart, and copy for patient signed, and dated. Patient/caregiver able to verbalize understanding.  Discharge Medication:   Medication List    STOP taking these medications        acetaminophen-codeine 300-30 MG tablet  Commonly known as:  TYLENOL #3     guaiFENesin 600 MG 12 hr tablet  Commonly known as:  MUCINEX      TAKE these medications        ALPRAZolam 0.5 MG tablet  Commonly known as:  XANAX  Take 1 tablet (0.5 mg total) by mouth 3 (three) times daily.     arformoterol 15 MCG/2ML Nebu  Commonly known as:  BROVANA  Take 2 mLs (15 mcg total) by nebulization 2 (two) times daily.     benzonatate 100 MG capsule  Commonly known as:  TESSALON  Take 1 capsule (100 mg total) by mouth 3 (three) times daily as needed for cough.     budesonide 0.5 MG/2ML nebulizer solution  Commonly known as:  PULMICORT  Take 2 mLs (0.5 mg total) by nebulization 2 (two) times daily.     ENSURE PLUS Liqd  Take 237 mLs by mouth 3 (three) times daily between meals.     FLUoxetine 40 MG capsule  Commonly known as:  PROZAC  Take 1 capsule (40 mg total) by mouth at bedtime.     fluticasone 50 MCG/ACT nasal spray  Commonly known as:  FLONASE  Place 2 sprays into both nostrils 2 (two) times daily.     ipratropium-albuterol 0.5-2.5 (3) MG/3ML Soln  Commonly known as:  DUONEB  Take 3 mLs by nebulization every 4 (four) hours as needed (shortness of breath/wheezing).     lactulose 10 GM/15ML solution  Commonly known as:  CHRONULAC  Take 30 mLs (20 g total) by mouth 2 (two) times daily as needed for mild constipation.     mometasone-formoterol 100-5 MCG/ACT Aero  Commonly known as:  DULERA  Inhale 2 puffs into the lungs 2 (two) times daily.     montelukast 10 MG tablet  Commonly known as:  SINGULAIR  Take 10 mg by mouth at bedtime.     morphine  30 MG tablet  Commonly known as:  MSIR  Take 30 mg by mouth every 12 (twelve) hours. Take every day per patient     oxyCODONE 15 MG immediate release tablet  Commonly known as:  ROXICODONE  Take 1 tablet (15 mg total) by mouth every 4 (four) hours. Take every day per patient     pantoprazole 40 MG tablet  Commonly known as:  PROTONIX  Take 1 tablet (40 mg total) by mouth daily at 12 noon.     pregabalin 75 MG capsule  Commonly known as:  LYRICA  Take 1 capsule (75 mg total) by mouth 2 (two) times daily. Reported on 08/06/2015     QUEtiapine 100 MG tablet  Commonly known as:  SEROQUEL  Take 1 tablet (100 mg total) by mouth 2 (two) times daily.     zolpidem 10 MG tablet  Commonly known as:  AMBIEN  Take 10 mg by mouth at bedtime as needed for sleep. Reported on 09/28/2015        Discharge Assessment: Filed Vitals:   11/07/15 0619 11/07/15 0626  BP:  102/68  Pulse:  97  Temp: 99.1 F (37.3 C)  Resp:  16   Skin clean, dry and intact without evidence of skin break down, no evidence of skin tears noted. IV catheter discontinued intact. Site without signs and symptoms of complications - no redness or edema noted at insertion site, patient denies c/o pain - only slight tenderness at site.  Dressing with slight pressure applied.  D/c Instructions-Education: Discharge instructions given to patient/family with verbalized understanding. D/c education completed with patient/family including follow up instructions, medication list, d/c activities limitations if indicated, with other d/c instructions as indicated by MD - patient able to verbalize understanding, all questions fully answered. Patient instructed to return to ED, call 911, or call MD for any changes in condition.  Patient escorted via WC, and D/C home via Taxi.  Kern ReapBrumagin, Aneudy Champlain L, RN 11/07/2015 2:28 PM

## 2015-11-07 NOTE — Discharge Summary (Signed)
[facility]  Physician Discharge Summary   Patient ID: Julie Kerr MRN: [id number] DOB/AGE: 1976-06-16 39 y.o.  Admit date: 11/04/2015 Discharge date: 11/07/2015  PCP: Jaclyn Shaggy, MD  DISCHARGE DIAGNOSES:  Active Problems:   Chronic pain syndrome   Upper airway cough syndrome, severe, with clinical VCD   Generalized anxiety disorder   Panic attacks   Polysubstance abuse   Chronic asthmatic bronchitis (HCC)   Vocal cord dysfunction   Acute respiratory failure with hypoxia (HCC)   RECOMMENDATIONS FOR OUTPATIENT FOLLOW UP: 1. Patient has been made an appointment with her PCP on April 5   DISCHARGE CONDITION: fair  Diet recommendation: As before  [facility] Weights   11/04/15 2200 11/05/15 0500 11/07/15 4696  Weight: 95.7 kg (210 lb 15.7 oz) 97.1 kg (214 lb 1.1 oz) 101.3 kg (223 lb 5.2 oz)    INITIAL HISTORY: 40 year old female with known history of generalized anxiety disorder, panic attacks, chronic Asthma, and vocal cord dysfunction in the setting of polysubstance abuse who presented with wheezing after the death of her sister.She briefly needed BIPAP but then was quickly weaned to room air on the day of admission.   HOSPITAL COURSE:   Acute respiratory failure due to vocal cord dysfunction Patient was initially admitted to the intensive care unit. She required BiPAP very briefly. She has improved significantly. She feels better. No infiltrates noted on chest x-ray. Continue current management. Patient was mobilized without any difficulty. Her symptoms are back to baseline. Patient was initially also seen by pulmonology.  History of generalized anxiety disorder and panic attacks This is a long-standing history for the patient. Made worse recently due to recent loss of her sister. Apparently sister was living in [place] and died due to stage IV cancer. Continue with the patient's anxiolytics and antidepressants. Patient denied any suicidal ideation. She feels  better this morning. She missed her last doctor's appointment and is requesting a refill on her Xanax. She has an appointment with her PCP on April 5. She'll be prescribed medicines to last till then.  History of chronic pain with narcotic dependence Previous notes reviewed. Patient apparently has a long-standing history of narcotic misuse. During her last hospitalization she apparently faked a fall in order to receive pain medications. Patient mentions that she missed her scheduled appointment in March and as a result has run out of her pain medications. [facility]. Next appointment is April 5. She will be prescribed few tablets of OxyIR. Her PCP has referred her to pain clinic. However, patient has not been able to establish yet  Microcytic anemia Hemoglobin is stable. No overt bleeding. Outpatient follow-up and management.  Overall improved. Patient appears to be back to baseline. She is saturating normally on room air. She has chronic symptoms due to vocal cord dysfunction. Okay for discharge home today.   PERTINENT LABS:  The results of significant diagnostics from this hospitalization (including imaging, microbiology, ancillary and laboratory) are listed below for reference.    Microbiology: Recent Results (from the past 240 hour(s))  Culture, blood (routine x 2)     Status: None (Preliminary result)   Collection Time: 11/04/15  1:28 PM  Result Value Ref Range Status   Specimen Description BLOOD RIGHT ANTECUBITAL  Final   Special Requests BOTTLES DRAWN AEROBIC AND ANAEROBIC 5CC  Final   Culture NO GROWTH 3 DAYS  Final   Report Status PENDING  Incomplete  Culture,  blood (routine x 2)     Status: None (Preliminary result)   Collection Time: 11/04/15  1:39 PM  Result Value Ref Range Status   Specimen Description BLOOD RIGHT HAND  Final   Special Requests BOTTLES DRAWN AEROBIC AND ANAEROBIC 5CC  Final   Culture NO GROWTH  3 DAYS  Final   Report Status PENDING  Incomplete  Respiratory virus panel     Status: None   Collection Time: 11/04/15  8:25 PM  Result Value Ref Range Status   Respiratory Syncytial Virus A Negative Negative Final   Respiratory Syncytial Virus B Negative Negative Final   Influenza A Negative Negative Final   Influenza B Negative Negative Final   Parainfluenza 1 Negative Negative Final   Parainfluenza 2 Negative Negative Final   Parainfluenza 3 Negative Negative Final   Metapneumovirus Negative Negative Final   Rhinovirus Negative Negative Final   Adenovirus Negative Negative Final    Comment: (NOTE) Performed At: [facility] [address] [place], [place] [id number] Mila Homer MD WU:[id number]   MRSA PCR Screening     Status: Abnormal   Collection Time: 11/04/15  8:25 PM  Result Value Ref Range Status   MRSA by PCR POSITIVE (A) NEGATIVE Final    Comment:        The GeneXpert MRSA Assay (FDA approved for NASAL specimens only), is one component of a comprehensive MRSA colonization surveillance program. It is not intended to diagnose MRSA infection nor to guide or monitor treatment for MRSA infections. RESULT CALLED TO, READ BACK BY AND VERIFIED WITH: M HOPPER RN 2230 11/04/15 A BROWNING   Urine culture     Status: None   Collection Time: 11/05/15  3:41 PM  Result Value Ref Range Status   Specimen Description URINE, CLEAN CATCH  Final   Special Requests NONE  Final   Culture MULTIPLE SPECIES PRESENT, SUGGEST RECOLLECTION  Final   Report Status 11/06/2015 FINAL  Final     Labs: Basic Metabolic Panel:  Recent Labs Lab 11/04/15 1005 11/05/15 0320 11/06/15 0739  NA 139 137 138  K 3.6 4.3 3.9  CL 106 103 104  CO2 20* 23 26  GLUCOSE 101* 189* 141*  BUN 6 11 12   CREATININE 0.83 0.84 0.90  CALCIUM 9.6 9.6 9.3  MG  --  2.4  --   PHOS  --  3.7  --    CBC:  Recent Labs Lab 11/04/15 1005 11/05/15 0320 11/06/15 0739  WBC 8.2 12.7* 10.5    NEUTROABS 5.4  --  6.7  HGB 11.4* 9.8* 9.6*  HCT 36.2 30.9* 31.5*  MCV 75.9* 76.1* 77.6*  PLT 487* 423* 393   BNP: BNP (last 3 results)  Recent Labs  08/26/15 2025 11/04/15 1005  BNP 7.4 8.4    IMAGING STUDIES Dg Chest Port 1 View  11/04/2015  CLINICAL DATA:  40 year old female with a history of shortness of breath. EXAM: PORTABLE CHEST 1 VIEW COMPARISON:  09/23/2015, 08/26/2015 FINDINGS: Cardiomediastinal silhouette unchanged, partially obscured by overlying defibrillator pads. Low lung volumes. Interstitial opacities. Blunting of the right costophrenic angle. No pneumothorax. No displaced fracture. IMPRESSION: Low lung volumes with ill-defined interstitial opacities of the lower lungs concerning for pneumonitis or edema. Defibrillator pads. Signed, Yvone Neu. Loreta Ave, DO [facility] Radiology Electronically Signed   By: Gilmer Mor D.O.   On: 11/04/2015 12:03    DISCHARGE EXAMINATION: Filed Vitals:   11/07/15 9562 11/07/15 1308 11/07/15 6578 11/07/15 1152  BP:  102/68    Pulse:  97    Temp: 99.1 F (37.3 C)     TempSrc: Oral     Resp:  16    Height:      Weight: 101.3 kg (223 lb 5.2 oz)     SpO2:  98% 98% 98%   General appearance: alert, cooperative, appears stated age and no distress Resp: Good air entry bilaterally. Upper airway wheezing is present. Cardio: regular rate and rhythm, S1, S2 normal, no murmur, click, rub or gallop GI: soft, non-tender; bowel sounds normal; no masses,  no organomegaly Extremities: extremities normal, atraumatic, no cyanosis or edema Neurologic: Alert and oriented X 3, normal strength and tone. Normal symmetric reflexes. Normal coordination and gait  DISPOSITION: Home  Discharge Instructions    AMB Referral to [facility] Care Management    Complete by:  As directed   Reason for consult:  Restart services - post hospital follow up  Expected date of contact:  1-3 days (reserved for hospital  discharges)  Please assign to community nurse for transition of care calls and assess for home visits. Patient has been in [place] for a few weeks and her sister passed away while she was there.   Please assess for social worker needs.  Patient confirms her best contact number is [phone], States she is limited to the number of minutes on her phone.  Questions please call:  Charlesetta Shanks, RN BSN CCM [facility] Liaison  [phone] business mobile phone Toll free office [phone]   Charlesetta Shanks, RN BSN CCM [facility]  [phone] business mobile phone Toll free office [phone]     Call MD for:  difficulty breathing, headache or visual disturbances    Complete by:  As directed      Call MD for:  extreme fatigue    Complete by:  As directed      Call MD for:  persistant dizziness or light-headedness    Complete by:  As directed      Call MD for:  persistant nausea and vomiting    Complete by:  As directed      Call MD for:  severe uncontrolled pain    Complete by:  As directed      Call MD for:  temperature >100.4    Complete by:  As directed      Diet - low sodium heart healthy    Complete by:  As directed      Discharge instructions    Complete by:  As directed   You are noted to be on both Pulmicort and Dulera. You are supposed to take only one of them. Please follow up with your PCP.  You were cared for by a hospitalist during your hospital stay. If you have any questions about your discharge medications or the care you received while you were in the hospital after you are discharged, you can call the unit and asked to speak with the hospitalist on call if the hospitalist that took care of you is not available. Once you are discharged, your primary care physician will handle any further medical issues. Please note that NO REFILLS for any discharge medications will be authorized once you are discharged, as it is imperative that  you return to your primary care physician (or establish a relationship with a primary care physician if you do not have one) for your aftercare needs so that they can reassess your need for medications and monitor  your lab values. If you do not have a primary care physician, you can call [phone] for a physician referral.     Increase activity slowly    Complete by:  As directed            ALLERGIES:  Allergies  Allergen Reactions  . Robitussin Dm [Dextromethorphan-Guaifenesin] Nausea And Vomiting  . Chocolate Hives  . Suboxone [Buprenorphine Hcl-Naloxone Hcl] Other (See Comments)    Aggressive behavior  . Nsaids Hives  . Rayon, Purified Hives  . Tramadol Hives     Current Discharge Medication List    START taking these medications   Details  benzonatate (TESSALON) 100 MG capsule Take 1 capsule (100 mg total) by mouth 3 (three) times daily as needed for cough. Qty: 20 capsule, Refills: 0      CONTINUE these medications which have CHANGED   Details  ALPRAZolam (XANAX) 0.5 MG tablet Take 1 tablet (0.5 mg total) by mouth 3 (three) times daily. Qty: 30 tablet, Refills: 0    oxyCODONE (ROXICODONE) 15 MG immediate release tablet Take 1 tablet (15 mg total) by mouth every 4 (four) hours. Take every day per patient Qty: 30 tablet, Refills: 0      CONTINUE these medications which have NOT CHANGED   Details  arformoterol (BROVANA) 15 MCG/2ML NEBU Take 2 mLs (15 mcg total) by nebulization 2 (two) times daily. Qty: 120 mL, Refills: 0    budesonide (PULMICORT) 0.5 MG/2ML nebulizer solution Take 2 mLs (0.5 mg total) by nebulization 2 (two) times daily. Qty: 120 mL, Refills: 1    ENSURE PLUS (ENSURE PLUS) LIQD Take 237 mLs by mouth 3 (three) times daily between meals.    FLUoxetine (PROZAC) 40 MG capsule Take 1 capsule (40 mg total) by mouth at bedtime. Qty: 30 capsule, Refills: 0    ipratropium-albuterol (DUONEB) 0.5-2.5 (3) MG/3ML SOLN Take 3 mLs by nebulization every 4 (four)  hours as needed (shortness of breath/wheezing). Qty: 360 mL, Refills: 2    mometasone-formoterol (DULERA) 100-5 MCG/ACT AERO Inhale 2 puffs into the lungs 2 (two) times daily.    montelukast (SINGULAIR) 10 MG tablet Take 10 mg by mouth at bedtime.    morphine (MSIR) 30 MG tablet Take 30 mg by mouth every 12 (twelve) hours. Take every day per patient    pantoprazole (PROTONIX) 40 MG tablet Take 1 tablet (40 mg total) by mouth daily at 12 noon.    pregabalin (LYRICA) 75 MG capsule Take 1 capsule (75 mg total) by mouth 2 (two) times daily. Reported on 08/06/2015 Qty: 60 capsule, Refills: 1    QUEtiapine (SEROQUEL) 100 MG tablet Take 1 tablet (100 mg total) by mouth 2 (two) times daily.    zolpidem (AMBIEN) 10 MG tablet Take 10 mg by mouth at bedtime as needed for sleep. Reported on 09/28/2015 Refills: 0    fluticasone (FLONASE) 50 MCG/ACT nasal spray Place 2 sprays into both nostrils 2 (two) times daily. Qty: 16 g, Refills: 3    lactulose (CHRONULAC) 10 GM/15ML solution Take 30 mLs (20 g total) by mouth 2 (two) times daily as needed for mild constipation. Qty: 240 mL, Refills: 0      STOP taking these medications     acetaminophen-codeine (TYLENOL #3) 300-30 MG tablet      guaiFENesin (MUCINEX) 600 MG 12 hr tablet        Follow-up Information    Follow up with [facility] Go on 11/18/2015.   Specialty:  Internal Medicine   Why:  at 10:00am for a hospital follow up appointment with Dr Venetia NightAmao.   Contact information:   [address] [id number] [place] [id number] [phone]      TOTAL DISCHARGE TIME: 35 minutes  [facility],Kaiea Esselman  [facility] Pager [phone]  11/07/2015, 12:50 PM

## 2015-11-07 NOTE — Progress Notes (Signed)
Patient does not have a way home per RN. Patient has exhausted all family and friends resources and reports no money to pay for cab fair.  LCSW will complete a one time taxi voucher for patient to home address. RN aware.  No other needs.  Deretha EmoryHannah Halee Glynn LCSW, MSW Clinical Social Work: Emergency Room [phone]

## 2015-11-09 ENCOUNTER — Other Ambulatory Visit: Payer: Self-pay

## 2015-11-09 LAB — CULTURE, BLOOD (ROUTINE X 2)
CULTURE: NO GROWTH
Culture: NO GROWTH

## 2015-11-10 ENCOUNTER — Other Ambulatory Visit: Payer: Self-pay

## 2015-11-10 NOTE — Patient Outreach (Signed)
Unsuccessful attempt made to contact patient for initial transition of care coordinator. Plan: Make another attempt to contact patient via telephone on tomorrow, March 28.

## 2015-11-10 NOTE — Patient Outreach (Signed)
Unsuccessful attempt to make contact patient via telephone at [phone], left HIPPA compliant message with this RNCM's contact information.  Plan: Make attempt #3 tomorrow if today's call is not returned today.

## 2015-11-11 ENCOUNTER — Other Ambulatory Visit: Payer: Self-pay

## 2015-11-11 NOTE — Patient Outreach (Signed)
Unsuccessful attempt made to contact patient via [phone]. HIPPA compliant message left for patient with this RNCM's contact information.  Plan: Make another attempt to contact patient via telephone on tomorrow, March 30

## 2015-11-12 ENCOUNTER — Other Ambulatory Visit: Payer: Self-pay

## 2015-11-12 NOTE — Patient Outreach (Signed)
Unsuccessful attempt made to contact patient via telephone to number [phone].  HIPPA compliant message left with this RNCM's contact information.  Plan: Make another attempt to contact on tomorrow for community care coordination, transition of care

## 2015-11-13 ENCOUNTER — Other Ambulatory Visit: Payer: Self-pay

## 2015-11-13 NOTE — Patient Outreach (Signed)
Attempted to contact patient via telephone at # [phone]. Unable to leave HIPPA compliant message as outgoing recorded  message indicated that patient was not available at this time, stated to call back later.    Plan:  Attempt final contact on Monday, April 3 for community care Coordination.

## 2015-11-18 ENCOUNTER — Inpatient Hospital Stay: Payer: Self-pay | Admitting: Critical Care Medicine

## 2015-11-21 ENCOUNTER — Ambulatory Visit (HOSPITAL_COMMUNITY)
Admission: EM | Admit: 2015-11-21 | Discharge: 2015-11-21 | Disposition: A | Discharge status: Home / Self Care | Payer: Medicare HMO | Attending: Family Medicine | Admitting: Family Medicine

## 2015-11-21 ENCOUNTER — Encounter (HOSPITAL_COMMUNITY): Payer: Self-pay | Admitting: *Deleted

## 2015-11-21 DIAGNOSIS — M7071 Other bursitis of hip, right hip: Secondary | ICD-10-CM | POA: Diagnosis not present

## 2015-11-21 MED ORDER — METAXALONE 800 MG PO TABS: 800.0000 mg | ORAL_TABLET | Freq: Three times a day (TID) | ORAL | Status: DC

## 2015-11-21 NOTE — Discharge Instructions (Signed)
Ice pack and medicine as prescribed, see your doctor if further problems.

## 2015-11-21 NOTE — ED Provider Notes (Signed)
CSN: [id number]     Arrival date & time 11/21/15  1315 History   First MD Initiated Contact with Patient 11/21/15 1413     Chief Complaint  Patient presents with  . Leg Pain   (Consider location/radiation/quality/duration/timing/severity/associated sxs/prior Treatment) Patient is a 40 y.o. female presenting with leg pain. The history is provided by the patient.  Leg Pain Location:  Hip and leg Injury: no   Hip location:  R hip Leg location:  R upper leg Pain details:    Duration:  5 days   Progression:  Worsening Chronicity:  New (relates that sister recently died from cancer and pt having to sleep on hard floors) Dislocation: no   Prior injury to area:  No Associated symptoms: back pain and stiffness   Associated symptoms: no decreased ROM, no numbness and no swelling   Risk factors comment:  H/o back surg.   Past Medical History  Diagnosis Date  . Asthma   . Anxiety   . Bronchitis   . Heart murmur   . Shortness of breath   . Hypertension   . Vocal cord dysfunction   . Chronic narcotic dependence [facility])    Past Surgical History  Procedure Laterality Date  . Spinal fusion  11/2012    Performed in [facility] by Dr. Mayford Knife  . Tracheostomy  December 2015   Family History  Problem Relation Age of Onset  . HIV Mother   . Heart disease Father   . CVA Father   . Heart disease Other   . Emphysema Maternal Grandmother     smoked  . Asthma Sister   . Clotting disorder Sister   . Clotting disorder Maternal Grandmother   . Lung cancer Maternal Grandmother     smoked   Social History  Substance Use Topics  . Smoking status: Former Smoker -- 2.00 packs/day for 17 years    Types: Cigarettes    Quit date: 06/15/2014  . Smokeless tobacco: Never Used  . Alcohol Use: 0.0 oz/week    0 Standard drinks or equivalent per week     Comment: occasional   OB History    No data available     Review of Systems  Musculoskeletal: Positive for back pain and stiffness.     Allergies  Robitussin dm; Chocolate; Suboxone; Nsaids; Rayon, purified; and Tramadol  Home Medications   Prior to Admission medications   Medication Sig Start Date End Date Taking? Authorizing Provider  ALPRAZolam Prudy Feeler) 0.5 MG tablet Take 1 tablet (0.5 mg total) by mouth 3 (three) times daily. 11/07/15   Osvaldo Shipper, MD  arformoterol (BROVANA) 15 MCG/2ML NEBU Take 2 mLs (15 mcg total) by nebulization 2 (two) times daily. 07/29/15   Vassie Loll, MD  benzonatate (TESSALON) 100 MG capsule Take 1 capsule (100 mg total) by mouth 3 (three) times daily as needed for cough. 11/07/15   Osvaldo Shipper, MD  budesonide (PULMICORT) 0.5 MG/2ML nebulizer solution Take 2 mLs (0.5 mg total) by nebulization 2 (two) times daily. 07/29/15   Vassie Loll, MD  ENSURE PLUS (ENSURE PLUS) LIQD Take 237 mLs by mouth 3 (three) times daily between meals.    Historical Provider, MD  FLUoxetine (PROZAC) 40 MG capsule Take 1 capsule (40 mg total) by mouth at bedtime. Patient taking differently: Take 40 mg by mouth at bedtime as needed (sleep).  08/06/15   Jaclyn Shaggy, MD  fluticasone (FLONASE) 50 MCG/ACT nasal spray Place 2 sprays into both nostrils 2 (two) times daily. Patient  not taking: Reported on 09/28/2015 04/30/15   Ripudeep K Rai, MD  ipratropium-albuterol (DUONEB) 0.5-2.5 (3) MG/3ML SOLN Take 3 mLs by nebulization every 4 (four) hours as needed (shortness of breath/wheezing). 07/29/15   Vassie Lollarlos Madera, MD  lactulose (CHRONULAC) 10 GM/15ML solution Take 30 mLs (20 g total) by mouth 2 (two) times daily as needed for mild constipation. Patient not taking: Reported on 09/28/2015 09/01/15   Joseph ArtJessica U Vann, DO  mometasone-formoterol (DULERA) 100-5 MCG/ACT AERO Inhale 2 puffs into the lungs 2 (two) times daily.    Historical Provider, MD  montelukast (SINGULAIR) 10 MG tablet Take 10 mg by mouth at bedtime.    Historical Provider, MD  morphine (MSIR) 30 MG tablet Take 30 mg by mouth every 12 (twelve) hours. Take  every day per patient    Historical Provider, MD  oxyCODONE (ROXICODONE) 15 MG immediate release tablet Take 1 tablet (15 mg total) by mouth every 4 (four) hours. Take every day per patient 11/07/15   Osvaldo ShipperGokul Krishnan, MD  pantoprazole (PROTONIX) 40 MG tablet Take 1 tablet (40 mg total) by mouth daily at 12 noon. 09/01/15   Joseph ArtJessica U Vann, DO  pregabalin (LYRICA) 75 MG capsule Take 1 capsule (75 mg total) by mouth 2 (two) times daily. Reported on 08/06/2015 08/06/15   Jaclyn ShaggyEnobong Amao, MD  QUEtiapine (SEROQUEL) 100 MG tablet Take 1 tablet (100 mg total) by mouth 2 (two) times daily. 09/01/15   Joseph ArtJessica U Vann, DO  zolpidem (AMBIEN) 10 MG tablet Take 10 mg by mouth at bedtime as needed for sleep. Reported on 09/28/2015 04/30/15   Historical Provider, MD   Meds Ordered and Administered this Visit  Medications - No data to display  BP 120/77 mmHg  Pulse 78  Temp(Src) 99.1 F (37.3 C) (Oral)  Resp 16  SpO2 100% No data found.   Physical Exam  Constitutional: She is oriented to person, place, and time. She appears well-developed and well-nourished.  Neck: Normal range of motion. Neck supple.  Musculoskeletal: Normal range of motion. She exhibits tenderness.       Right hip: She exhibits tenderness. She exhibits no bony tenderness, no swelling and no deformity.       Legs: Lymphadenopathy:    She has no cervical adenopathy.  Neurological: She is alert and oriented to person, place, and time.  Skin: Skin is warm and dry.  Nursing note and vitals reviewed.   ED Course  Procedures (including critical care time)  Labs Review Labs Reviewed - No data to display  Imaging Review No results found.   Visual Acuity Review  Right Eye Distance:   Left Eye Distance:   Bilateral Distance:    Right Eye Near:   Left Eye Near:    Bilateral Near:         MDM  No diagnosis found.  Meds ordered this encounter  Medications  . metaxalone (SKELAXIN) 800 MG tablet    Sig: Take 1 tablet (800 mg  total) by mouth 3 (three) times daily.    Dispense:  21 tablet    Refill:  0      Linna HoffJames D Kindl, MD 11/21/15 1440

## 2015-11-21 NOTE — ED Notes (Signed)
Pt  Reports  Pain R   Leg  And  r  Hip    -  Pt  States   She  Has  Been  Sleeping  On a  Hard  Floor        pt  Reports     Out   Of her  Pain meds       Pt  Ambulated  To  Room  With a  Steady  Fluid  Gait

## 2015-12-02 ENCOUNTER — Ambulatory Visit: Payer: Medicare HMO | Attending: Critical Care Medicine | Admitting: Critical Care Medicine

## 2015-12-02 ENCOUNTER — Encounter: Payer: Self-pay | Admitting: Critical Care Medicine

## 2015-12-02 VITALS — BP 102/70 | HR 76 | Temp 98.8°F | Resp 20 | Ht 62.0 in | Wt 208.0 lb

## 2015-12-02 DIAGNOSIS — J9601 Acute respiratory failure with hypoxia: Secondary | ICD-10-CM

## 2015-12-02 DIAGNOSIS — J411 Mucopurulent chronic bronchitis: Secondary | ICD-10-CM

## 2015-12-02 DIAGNOSIS — F1120 Opioid dependence, uncomplicated: Secondary | ICD-10-CM | POA: Diagnosis not present

## 2015-12-02 DIAGNOSIS — J029 Acute pharyngitis, unspecified: Secondary | ICD-10-CM | POA: Diagnosis not present

## 2015-12-02 DIAGNOSIS — J441 Chronic obstructive pulmonary disease with (acute) exacerbation: Secondary | ICD-10-CM | POA: Diagnosis not present

## 2015-12-02 DIAGNOSIS — J4489 Other specified chronic obstructive pulmonary disease: Secondary | ICD-10-CM

## 2015-12-02 DIAGNOSIS — I10 Essential (primary) hypertension: Secondary | ICD-10-CM | POA: Diagnosis not present

## 2015-12-02 DIAGNOSIS — J45909 Unspecified asthma, uncomplicated: Secondary | ICD-10-CM

## 2015-12-02 DIAGNOSIS — Z79899 Other long term (current) drug therapy: Secondary | ICD-10-CM | POA: Insufficient documentation

## 2015-12-02 DIAGNOSIS — J4551 Severe persistent asthma with (acute) exacerbation: Secondary | ICD-10-CM

## 2015-12-02 DIAGNOSIS — Z823 Family history of stroke: Secondary | ICD-10-CM | POA: Diagnosis not present

## 2015-12-02 DIAGNOSIS — R058 Other specified cough: Secondary | ICD-10-CM

## 2015-12-02 DIAGNOSIS — R059 Cough, unspecified: Secondary | ICD-10-CM

## 2015-12-02 DIAGNOSIS — J4550 Severe persistent asthma, uncomplicated: Secondary | ICD-10-CM | POA: Diagnosis not present

## 2015-12-02 DIAGNOSIS — R05 Cough: Secondary | ICD-10-CM

## 2015-12-02 DIAGNOSIS — Z87891 Personal history of nicotine dependence: Secondary | ICD-10-CM | POA: Diagnosis not present

## 2015-12-02 DIAGNOSIS — Z9981 Dependence on supplemental oxygen: Secondary | ICD-10-CM | POA: Diagnosis not present

## 2015-12-02 DIAGNOSIS — J449 Chronic obstructive pulmonary disease, unspecified: Secondary | ICD-10-CM

## 2015-12-02 DIAGNOSIS — Z888 Allergy status to other drugs, medicaments and biological substances status: Secondary | ICD-10-CM | POA: Insufficient documentation

## 2015-12-02 DIAGNOSIS — J019 Acute sinusitis, unspecified: Secondary | ICD-10-CM | POA: Insufficient documentation

## 2015-12-02 DIAGNOSIS — J0141 Acute recurrent pansinusitis: Secondary | ICD-10-CM

## 2015-12-02 DIAGNOSIS — J383 Other diseases of vocal cords: Secondary | ICD-10-CM

## 2015-12-02 MED ORDER — METAXALONE 800 MG PO TABS: 800.0000 mg | ORAL_TABLET | Freq: Three times a day (TID) | ORAL | Status: DC

## 2015-12-02 MED ORDER — AMOXICILLIN-POT CLAVULANATE 875-125 MG PO TABS: 1.0000 | ORAL_TABLET | Freq: Two times a day (BID) | ORAL | Status: DC

## 2015-12-02 MED ORDER — ALPRAZOLAM 0.5 MG PO TABS: 0.5000 mg | ORAL_TABLET | Freq: Three times a day (TID) | ORAL | Status: DC | PRN

## 2015-12-02 MED ORDER — TRAMADOL HCL 50 MG PO TABS: 50.0000 mg | ORAL_TABLET | Freq: Three times a day (TID) | ORAL | Status: DC | PRN

## 2015-12-02 MED ORDER — FLUTICASONE PROPIONATE 50 MCG/ACT NA SUSP: 2.0000 | Freq: Two times a day (BID) | NASAL | Status: DC

## 2015-12-02 MED ORDER — MOMETASONE FURO-FORMOTEROL FUM 100-5 MCG/ACT IN AERO: 2.0000 | INHALATION_SPRAY | Freq: Two times a day (BID) | RESPIRATORY_TRACT | Status: DC

## 2015-12-02 MED ORDER — PREGABALIN 75 MG PO CAPS: 75.0000 mg | ORAL_CAPSULE | Freq: Two times a day (BID) | ORAL | Status: DC

## 2015-12-02 MED ORDER — BENZONATATE 100 MG PO CAPS: 100.0000 mg | ORAL_CAPSULE | Freq: Three times a day (TID) | ORAL | Status: DC | PRN

## 2015-12-02 MED ORDER — IPRATROPIUM-ALBUTEROL 0.5-2.5 (3) MG/3ML IN SOLN: 3.0000 mL | RESPIRATORY_TRACT | Status: DC | PRN

## 2015-12-02 MED ORDER — PREDNISONE 10 MG PO TABS: ORAL_TABLET | ORAL | Status: DC

## 2015-12-02 MED FILL — traMADol HCL 50 MG TABS: 50 | 20 days supply | Qty: 60 | Fill #0

## 2015-12-02 NOTE — Progress Notes (Signed)
Subjective:    Patient ID: Julie Kerr, female    DOB: 1975/09/24, 40 y.o.   MRN: [id number]  Shortness of Breath Associated symptoms include ear pain, a sore throat and wheezing.     Pt in hosp 3/17: HOSPITAL COURSE:   Acute respiratory failure due to vocal cord dysfunction Patient was initially admitted to the intensive care unit. She required BiPAP very briefly. She has improved significantly. She feels better. No infiltrates noted on chest x-ray. Continue current management. Patient was mobilized without any difficulty. Her symptoms are back to baseline. Patient was initially also seen by pulmonology.  History of generalized anxiety disorder and panic attacks This is a long-standing history for the patient. Made worse recently due to recent loss of her sister. Apparently sister was living in [place] and died due to stage IV cancer. Continue with the patient's anxiolytics and antidepressants. Patient denied any suicidal ideation. She feels better this morning. She missed her last doctor's appointment and is requesting a refill on her Xanax. She has an appointment with her PCP on April 5. She'll be prescribed medicines to last till then.  History of chronic pain with narcotic dependence Previous notes reviewed. Patient apparently has a long-standing history of narcotic misuse. During her last hospitalization she apparently faked a fall in order to receive pain medications. Patient mentions that she missed her scheduled appointment in March and as a result has run out of her pain medications. [facility]. Next appointment is April 5. She will be prescribed few tablets of OxyIR. Her PCP has referred her to pain clinic. However, patient has not been able to establish yet  Microcytic anemia Hemoglobin is stable. No overt bleeding. Outpatient follow-up and management.   12/02/2015  Chief Complaint  Patient presents with    . Hospitalization Follow-up    Respiratory Failure  Post hosp f/u  Pt wit hTrach previously for 4months. Hx of severe VCD. Dx of this end of last year.   Hx of copd and asthma and VCD anxiety. No smoking now. Pt in hosp 10/2015 and rx bipap, see above Adm 3/22 and 3/25 Uses oxygen at night Pt with still cough.  Cough is prod thick green and yellow and occ blood.   Notes some green out of nose and gray. Robaxin and neurontin helps and tramadol help Nasal spray, proair, dulera are all out  Review of Systems  HENT: Positive for congestion, ear discharge, ear pain, postnasal drip, sinus pressure and sore throat.   Respiratory: Positive for cough, choking, chest tightness, shortness of breath and wheezing.    Past Medical History  Diagnosis Date  . Asthma   . Anxiety   . Bronchitis   . Heart murmur   . Shortness of breath   . Hypertension   . Vocal cord dysfunction   . Chronic narcotic dependence (HCC)      Family History  Problem Relation Age of Onset  . HIV Mother   . Heart disease Father   . CVA Father   . Heart disease Other   . Emphysema Maternal Grandmother     smoked  . Asthma Sister   . Clotting disorder Sister   . Clotting disorder Maternal Grandmother   . Lung cancer Maternal Grandmother     smoked     Social History   Social History  . Marital Status: Single    Spouse Name: N/A  . Number of  Children: N/A  . Years of Education: N/A   Occupational History  . Not on file.   Social History Main Topics  . Smoking status: Former Smoker -- 2.00 packs/day for 17 years    Types: Cigarettes    Quit date: 06/15/2014  . Smokeless tobacco: Never Used  . Alcohol Use: 0.0 oz/week    0 Standard drinks or equivalent per week     Comment: occasional  . Drug Use: No  . Sexual Activity: Yes    Birth Control/ Protection: Other-see comments     Comment: female partners only   Other Topics Concern  . Not on file   Social History Narrative     Allergies   Allergen Reactions  . Robitussin Dm [Dextromethorphan-Guaifenesin] Nausea And Vomiting  . Chocolate Hives  . Suboxone [Buprenorphine Hcl-Naloxone Hcl] Other (See Comments)    Aggressive behavior  . Nsaids Hives  . [place], Purified Hives     Outpatient Prescriptions Prior to Visit  Medication Sig Dispense Refill  . ENSURE PLUS (ENSURE PLUS) LIQD Take 237 mLs by mouth 3 (three) times daily between meals. Reported on 12/02/2015    . lactulose (CHRONULAC) 10 GM/15ML solution Take 30 mLs (20 g total) by mouth 2 (two) times daily as needed for mild constipation. (Patient not taking: Reported on 09/28/2015) 240 mL 0  . montelukast (SINGULAIR) 10 MG tablet Take 10 mg by mouth at bedtime. Reported on 12/02/2015    . morphine (MSIR) 30 MG tablet Take 30 mg by mouth every 12 (twelve) hours. Reported on 12/03/2015    . oxyCODONE (ROXICODONE) 15 MG immediate release tablet Take 1 tablet (15 mg total) by mouth every 4 (four) hours. Take every day per patient (Patient not taking: Reported on 12/02/2015) 30 tablet 0  . pantoprazole (PROTONIX) 40 MG tablet Take 1 tablet (40 mg total) by mouth daily at 12 noon.    . QUEtiapine (SEROQUEL) 100 MG tablet Take 1 tablet (100 mg total) by mouth 2 (two) times daily. (Patient not taking: Reported on 12/02/2015)    . zolpidem (AMBIEN) 10 MG tablet Take 10 mg by mouth at bedtime as needed for sleep. Reported on 12/03/2015  0  . ALPRAZolam (XANAX) 0.5 MG tablet Take 1 tablet (0.5 mg total) by mouth 3 (three) times daily. (Patient not taking: Reported on 12/02/2015) 30 tablet 0  . arformoterol (BROVANA) 15 MCG/2ML NEBU Take 2 mLs (15 mcg total) by nebulization 2 (two) times daily. (Patient not taking: Reported on 12/02/2015) 120 mL 0  . benzonatate (TESSALON) 100 MG capsule Take 1 capsule (100 mg total) by mouth 3 (three) times daily as needed for cough. (Patient not taking: Reported on 12/02/2015) 20 capsule 0  . budesonide (PULMICORT) 0.5 MG/2ML nebulizer solution Take 2 mLs (0.5  mg total) by nebulization 2 (two) times daily. (Patient not taking: Reported on 12/02/2015) 120 mL 1  . FLUoxetine (PROZAC) 40 MG capsule Take 1 capsule (40 mg total) by mouth at bedtime. (Patient not taking: Reported on 12/02/2015) 30 capsule 0  . fluticasone (FLONASE) 50 MCG/ACT nasal spray Place 2 sprays into both nostrils 2 (two) times daily. (Patient not taking: Reported on 09/28/2015) 16 g 3  . ipratropium-albuterol (DUONEB) 0.5-2.5 (3) MG/3ML SOLN Take 3 mLs by nebulization every 4 (four) hours as needed (shortness of breath/wheezing). (Patient not taking: Reported on 12/02/2015) 360 mL 2  . metaxalone (SKELAXIN) 800 MG tablet Take 1 tablet (800 mg total) by mouth 3 (three) times daily. (Patient not taking: Reported  on 12/02/2015) 21 tablet 0  . mometasone-formoterol (DULERA) 100-5 MCG/ACT AERO Inhale 2 puffs into the lungs 2 (two) times daily. Reported on 12/02/2015    . pregabalin (LYRICA) 75 MG capsule Take 1 capsule (75 mg total) by mouth 2 (two) times daily. Reported on 08/06/2015 (Patient not taking: Reported on 12/02/2015) 60 capsule 1   No facility-administered medications prior to visit.         Objective:   Physical Exam Filed Vitals:   12/02/15 1032  BP: 102/70  Pulse: 76  Temp: 98.8 F (37.1 C)  TempSrc: Oral  Resp: 20  Height:  (1.575 m)  Weight: 208 lb (94.348 kg)  SpO2: 100%    Gen: Pleasant, obese , in no distress, anxious  affect  ENT: No lesions,  mouth clear,  oropharynx clear, no postnasal drip  Neck: No JVD, no TMG, no carotid bruits  Trach site healed   Lungs: No use of accessory muscles, no dullness to percussion,insp exp wheeze  Cardiovascular: RRR, heart sounds normal, no murmur or gallops, no peripheral edema  Abdomen: soft and NT, no HSM,  BS normal  Musculoskeletal: No deformities, no cyanosis or clubbing  Neuro: alert, non focal  Skin: Warm, no lesions or rashes  No results found.        Assessment & Plan:  I personally reviewed  all images and lab data in the [facility] system as well as any outside material available during this office visit and agree with the  radiology impressions.   Acute respiratory failure with hypoxia (HCC) Acute hypoxic resp failure d/t  tracheobronchitis and vocal cord dysfunction with copd exac Now exacerbated Plan No need for oxygen Rx Take augmentin twice daily for sinus infection Prednisone  Take 4 for three days 3 for three days 2 for three days 1 for three days and stop Refill on dulera sent Refilled duoneb nebulizer medication Refilled flonase Keep follow up primary care appointment Follow up with Dr Delford Field one month    Chronic asthmatic bronchitis (HCC) Former Systems developer pt in [employer]  Had challenges with pt activation and left clinic in 2016 Recent hosp stay for flare and VCD with bipap need Now with persistent AB flare and tracheobronchitis Plan Take augmentin twice daily for sinus infection Prednisone  Take 4 for three days 3 for three days 2 for three days 1 for three days and stop Refill on dulera sent Refilled tramadol, skelaxin Refilled duoneb nebulizer medication Refilled xanax Refilled flonase Keep follow up primary care appointment Follow up with Dr Delford Field one month   COPD (chronic obstructive pulmonary disease) Copd with tracheobronchitis flare   rx neb tx in office   Julie Kerr was seen today for hospitalization follow-up.  Diagnoses and all orders for this visit:  Chronic asthmatic bronchitis (HCC)  Asthma, severe persistent, with acute exacerbation  Cough  Acute recurrent pansinusitis  Upper airway cough syndrome, severe, with clinical VCD  Vocal cord dysfunction  Acute respiratory failure with hypoxia (HCC)  Mucopurulent chronic bronchitis (HCC)  Other orders -     Discontinue: ALPRAZolam (XANAX) 0.5 MG tablet; Take 1 tablet (0.5 mg total) by mouth 3 (three) times daily as needed for anxiety. -     benzonatate (TESSALON) 100 MG capsule;  Take 1 capsule (100 mg total) by mouth 3 (three) times daily as needed for cough. (Patient not taking: Reported on 12/03/2015) -     fluticasone (FLONASE) 50 MCG/ACT nasal spray; Place 2 sprays into both nostrils 2 (two) times  daily. -     ipratropium-albuterol (DUONEB) 0.5-2.5 (3) MG/3ML SOLN; Take 3 mLs by nebulization every 4 (four) hours as needed (shortness of breath/wheezing). -     Discontinue: metaxalone (SKELAXIN) 800 MG tablet; Take 1 tablet (800 mg total) by mouth 3 (three) times daily. -     mometasone-formoterol (DULERA) 100-5 MCG/ACT AERO; Inhale 2 puffs into the lungs 2 (two) times daily. Reported on 12/02/2015 -     Discontinue: pregabalin (LYRICA) 75 MG capsule; Take 1 capsule (75 mg total) by mouth 2 (two) times daily. Reported on 08/06/2015 (Patient not taking: Reported on 12/03/2015) -     predniSONE (DELTASONE) 10 MG tablet; Take 4 for three days 3 for three days 2 for three days 1 for three days and stop -     Discontinue: traMADol (ULTRAM) 50 MG tablet; Take 1 tablet (50 mg total) by mouth every 8 (eight) hours as needed. -     Discontinue: amoxicillin-clavulanate (AUGMENTIN) 875-125 MG tablet; Take 1 tablet by mouth 2 (two) times daily. (Patient not taking: Reported on 12/03/2015) -     ALPRAZolam (XANAX) 0.5 MG tablet; Take 1 tablet (0.5 mg total) by mouth 3 (three) times daily as needed for anxiety. -     Discontinue: traMADol (ULTRAM) 50 MG tablet; Take 1 tablet (50 mg total) by mouth every 8 (eight) hours as needed. (Patient not taking: Reported on 12/03/2015)

## 2015-12-02 NOTE — Progress Notes (Signed)
Patient is here for HFU for Respiratory Failuire  Patient complains of lower back pain being scaled currently at a 10. Pain is described as burning and throbbing.  Patient request refills on pain medications.

## 2015-12-02 NOTE — Patient Instructions (Addendum)
Take augmentin twice daily for sinus infection Prednisone 10mg  Take 4 for three days 3 for three days 2 for three days 1 for three days and stop Refill on dulera sent Refilled tramadol, skelaxin Refilled duoneb nebulizer medication Refilled xanax Refilled flonase Keep follow up primary care appointment Follow up with Dr Delford FieldWright one month

## 2015-12-02 NOTE — Assessment & Plan Note (Signed)
Copd with tracheobronchitis flare   rx neb tx in office

## 2015-12-02 NOTE — Assessment & Plan Note (Signed)
Acute hypoxic resp failure d/t  tracheobronchitis and vocal cord dysfunction with copd exac Now exacerbated Plan No need for oxygen Rx Take augmentin twice daily for sinus infection Prednisone 10mg  Take 4 for three days 3 for three days 2 for three days 1 for three days and stop Refill on dulera sent Refilled duoneb nebulizer medication Refilled flonase Keep follow up primary care appointment Follow up with Dr Delford FieldWright one month

## 2015-12-02 NOTE — Assessment & Plan Note (Signed)
Former [employer] [facility] pt in [employer]  Had challenges with pt activation and left clinic in 2016 Recent hosp stay for flare and VCD with bipap need Now with persistent AB flare and tracheobronchitis Plan Take augmentin twice daily for sinus infection Prednisone 10mg  Take 4 for three days 3 for three days 2 for three days 1 for three days and stop Refill on dulera sent Refilled tramadol, skelaxin Refilled duoneb nebulizer medication Refilled xanax Refilled flonase Keep follow up primary care appointment Follow up with Dr Delford FieldWright one month

## 2015-12-03 ENCOUNTER — Ambulatory Visit: Payer: Medicare HMO | Attending: Family Medicine | Admitting: Family Medicine

## 2015-12-03 ENCOUNTER — Encounter: Payer: Self-pay | Admitting: Family Medicine

## 2015-12-03 VITALS — BP 106/69 | HR 77 | Temp 98.6°F | Resp 18 | Ht 62.0 in | Wt 208.0 lb

## 2015-12-03 DIAGNOSIS — G894 Chronic pain syndrome: Secondary | ICD-10-CM | POA: Diagnosis not present

## 2015-12-03 DIAGNOSIS — D6489 Other specified anemias: Secondary | ICD-10-CM

## 2015-12-03 DIAGNOSIS — D649 Anemia, unspecified: Secondary | ICD-10-CM | POA: Insufficient documentation

## 2015-12-03 DIAGNOSIS — F411 Generalized anxiety disorder: Secondary | ICD-10-CM | POA: Insufficient documentation

## 2015-12-03 DIAGNOSIS — J209 Acute bronchitis, unspecified: Secondary | ICD-10-CM | POA: Insufficient documentation

## 2015-12-03 DIAGNOSIS — M549 Dorsalgia, unspecified: Secondary | ICD-10-CM | POA: Diagnosis not present

## 2015-12-03 DIAGNOSIS — J383 Other diseases of vocal cords: Secondary | ICD-10-CM

## 2015-12-03 DIAGNOSIS — Z79899 Other long term (current) drug therapy: Secondary | ICD-10-CM | POA: Insufficient documentation

## 2015-12-03 DIAGNOSIS — J440 Chronic obstructive pulmonary disease with acute lower respiratory infection: Secondary | ICD-10-CM | POA: Diagnosis not present

## 2015-12-03 LAB — CBC WITH DIFFERENTIAL/PLATELET
BASOS PCT: 1 %
Basophils Absolute: 73 cells/uL (ref 0–200)
EOS PCT: 2 %
Eosinophils Absolute: 146 cells/uL (ref 15–500)
HCT: 33.9 % — ABNORMAL LOW (ref 35.0–45.0)
Hemoglobin: 10.2 g/dL — ABNORMAL LOW (ref 11.7–15.5)
Lymphocytes Relative: 30 %
Lymphs Abs: 2190 cells/uL (ref 850–3900)
MCH: 23 pg — AB (ref 27.0–33.0)
MCHC: 30.1 g/dL — ABNORMAL LOW (ref 32.0–36.0)
MCV: 76.4 fL — ABNORMAL LOW (ref 80.0–100.0)
MONOS PCT: 6 %
MPV: 10 fL (ref 7.5–12.5)
Monocytes Absolute: 438 cells/uL (ref 200–950)
NEUTROS ABS: 4453 {cells}/uL (ref 1500–7800)
Neutrophils Relative %: 61 %
PLATELETS: 337 10*3/uL (ref 140–400)
RBC: 4.44 MIL/uL (ref 3.80–5.10)
RDW: 19.5 % — ABNORMAL HIGH (ref 11.0–15.0)
WBC: 7.3 10*3/uL (ref 3.8–10.8)

## 2015-12-03 MED ORDER — GABAPENTIN 300 MG PO CAPS: 600.0000 mg | ORAL_CAPSULE | Freq: Two times a day (BID) | ORAL | Status: DC

## 2015-12-03 MED ORDER — METHOCARBAMOL 500 MG PO TABS
1000.0000 mg | ORAL_TABLET | Freq: Four times a day (QID) | ORAL | Status: DC
Start: 2015-12-03 — End: 2016-02-03
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
## 2015-12-04 ENCOUNTER — Encounter: Payer: Self-pay | Admitting: Clinical

## 2015-12-04 ENCOUNTER — Other Ambulatory Visit: Payer: Self-pay | Admitting: Family Medicine
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
## 2015-12-04 NOTE — Progress Notes (Signed)
Dep[facility] 12/02/2015 09/28/2015 08/06/2015 05/08/2015  Decreased Interest 1 2 0 1  Down, Depressed, Hopeless 1 1 0 0  PHQ - 2 Score 2 3 0 1  Altered sleeping 1 2 - -  Tired, decreased energy 1 2 - -  Change in appetite 1 2 - -  Feeling bad or failure about yourself  0 1 - -  Trouble concentrating 1 1 - -  Moving slowly or fidgety/restless 0 1 - -  Suicidal thoughts 0 1 - -  PHQ-9 Score 6 13 - -  Some encounter information is confidential and restricted. Go to Review Flowsheets activity to see all data.    GAD 7 : Generalized Anxiety Score 12/02/2015 09/28/2015 08/06/2015  Nervous, Anxious, on Edge 1 3 3   Control/stop worrying 1 3 3   Worry too much - different things 1 3 3   Trouble relaxing 1 3 3   Restless 1 3 3   Easily annoyed or irritable 1 3 3   Afraid - awful might happen 0 3 3  Total GAD 7 Score 6 21 21   Anxiety Difficulty - - Very difficult

## 2015-12-07 ENCOUNTER — Other Ambulatory Visit: Payer: Self-pay | Admitting: Pharmacist

## 2015-12-07 IMAGING — CR DG ABD PORTABLE 1V
1 series · 1 of 1 positions shown · non-contrast
Comparison: Abdominal radiograph 07/31/2014

CLINICAL DATA: Abdominal pain.  Known umbilical hernia.

EXAM:
PORTABLE ABDOMEN - 1 VIEW

[AP]
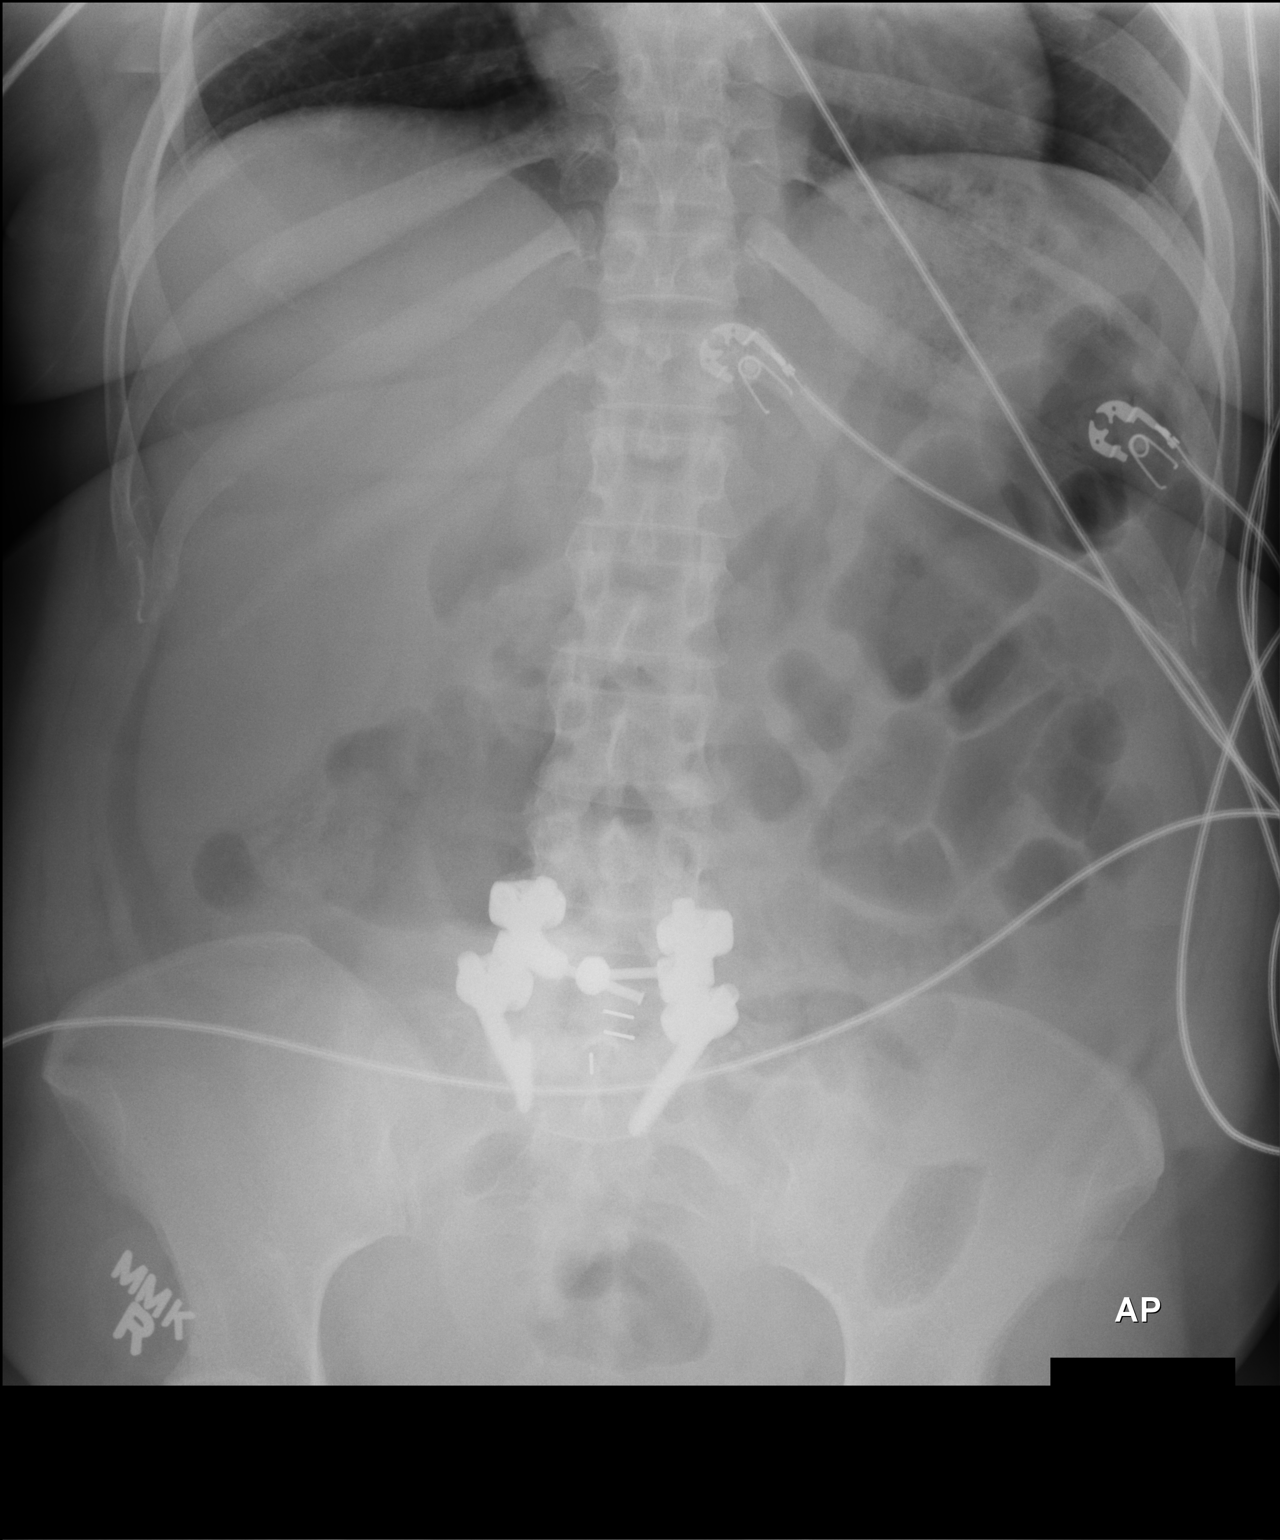

[1 of 1 positions shown; findings below may reference images not displayed]

FINDINGS: There is no free intra-abdominal air identified on supine
single-view. No dilated bowel loops to suggest obstruction. Small
volume of stool throughout the colon. No radiopaque calculi.
Postsurgical change at the lumbosacral junction. No acute osseous
abnormalities are seen.
IMPRESSION: Normal bowel gas pattern.

## 2015-12-07 MED ORDER — BUDESONIDE-FORMOTEROL FUMARATE 80-4.5 MCG/ACT IN AERO: 2.0000 | INHALATION_SPRAY | Freq: Two times a day (BID) | RESPIRATORY_TRACT | Status: DC

## 2015-12-09 ENCOUNTER — Other Ambulatory Visit: Payer: Self-pay | Admitting: Family Medicine

## 2015-12-09 DIAGNOSIS — G894 Chronic pain syndrome: Secondary | ICD-10-CM

## 2015-12-15 NOTE — Progress Notes (Signed)
Patient was contacted about her insurance, [employer] denying her for [facility].  Patient informed me that she also has medicare and medicaid. I suggested to the patient that she could reach out to her other insurance company to see if the [facility] is covered under her insurance.  Patient agreed to reach out to her other insurance.

## 2015-12-22 ENCOUNTER — Emergency Department (HOSPITAL_COMMUNITY): Payer: Medicare HMO

## 2015-12-22 ENCOUNTER — Encounter (HOSPITAL_COMMUNITY): Payer: Self-pay | Admitting: *Deleted

## 2015-12-22 ENCOUNTER — Emergency Department (HOSPITAL_COMMUNITY)
Admission: EM | Admit: 2015-12-22 | Discharge: 2015-12-22 | Disposition: A | Discharge status: Home / Self Care | Payer: Medicare HMO | Attending: Emergency Medicine | Admitting: Emergency Medicine

## 2015-12-22 DIAGNOSIS — R011 Cardiac murmur, unspecified: Secondary | ICD-10-CM | POA: Insufficient documentation

## 2015-12-22 DIAGNOSIS — R061 Stridor: Secondary | ICD-10-CM

## 2015-12-22 DIAGNOSIS — F32A Depression, unspecified: Secondary | ICD-10-CM

## 2015-12-22 DIAGNOSIS — R0602 Shortness of breath: Secondary | ICD-10-CM | POA: Diagnosis present

## 2015-12-22 DIAGNOSIS — R0603 Acute respiratory distress: Secondary | ICD-10-CM

## 2015-12-22 DIAGNOSIS — J383 Other diseases of vocal cords: Secondary | ICD-10-CM | POA: Diagnosis not present

## 2015-12-22 DIAGNOSIS — J45901 Unspecified asthma with (acute) exacerbation: Secondary | ICD-10-CM | POA: Diagnosis not present

## 2015-12-22 DIAGNOSIS — F329 Major depressive disorder, single episode, unspecified: Secondary | ICD-10-CM | POA: Insufficient documentation

## 2015-12-22 DIAGNOSIS — Z7951 Long term (current) use of inhaled steroids: Secondary | ICD-10-CM | POA: Diagnosis not present

## 2015-12-22 DIAGNOSIS — I10 Essential (primary) hypertension: Secondary | ICD-10-CM | POA: Diagnosis not present

## 2015-12-22 DIAGNOSIS — Z79899 Other long term (current) drug therapy: Secondary | ICD-10-CM | POA: Insufficient documentation

## 2015-12-22 DIAGNOSIS — Z87891 Personal history of nicotine dependence: Secondary | ICD-10-CM | POA: Insufficient documentation

## 2015-12-22 DIAGNOSIS — F419 Anxiety disorder, unspecified: Secondary | ICD-10-CM | POA: Insufficient documentation

## 2015-12-22 DIAGNOSIS — J80 Acute respiratory distress syndrome: Secondary | ICD-10-CM | POA: Insufficient documentation

## 2015-12-22 LAB — I-STAT VENOUS BLOOD GAS, ED
Acid-base deficit: 1 mmol/L (ref 0.0–2.0)
BICARBONATE: 25.3 meq/L — AB (ref 20.0–24.0)
O2 Saturation: 97 %
PCO2 VEN: 46.7 mmHg (ref 45.0–50.0)
PH VEN: 7.342 — AB (ref 7.250–7.300)
PO2 VEN: 93 mmHg — AB (ref 31.0–45.0)
TCO2: 27 mmol/L (ref 0–100)

## 2015-12-22 LAB — CBC WITH DIFFERENTIAL/PLATELET
BASOS ABS: 0 10*3/uL (ref 0.0–0.1)
BASOS PCT: 1 %
EOS ABS: 0.1 10*3/uL (ref 0.0–0.7)
Eosinophils Relative: 2 %
HEMATOCRIT: 36.7 % (ref 36.0–46.0)
Hemoglobin: 11.4 g/dL — ABNORMAL LOW (ref 12.0–15.0)
Lymphocytes Relative: 35 %
Lymphs Abs: 2.6 10*3/uL (ref 0.7–4.0)
MCH: 23.6 pg — ABNORMAL LOW (ref 26.0–34.0)
MCHC: 31.1 g/dL (ref 30.0–36.0)
MCV: 75.8 fL — ABNORMAL LOW (ref 78.0–100.0)
Monocytes Absolute: 0.4 10*3/uL (ref 0.1–1.0)
Monocytes Relative: 5 %
NEUTROS ABS: 4.4 10*3/uL (ref 1.7–7.7)
NEUTROS PCT: 59 %
Platelets: 452 10*3/uL — ABNORMAL HIGH (ref 150–400)
RBC: 4.84 MIL/uL (ref 3.87–5.11)
RDW: 19.1 % — ABNORMAL HIGH (ref 11.5–15.5)
WBC: 7.6 10*3/uL (ref 4.0–10.5)

## 2015-12-22 LAB — BASIC METABOLIC PANEL
ANION GAP: 10 (ref 5–15)
BUN: 9 mg/dL (ref 6–20)
CO2: 21 mmol/L — ABNORMAL LOW (ref 22–32)
Calcium: 9.6 mg/dL (ref 8.9–10.3)
Chloride: 109 mmol/L (ref 101–111)
Creatinine, Ser: 0.74 mg/dL (ref 0.44–1.00)
GFR calc non Af Amer: >60 mL/min (ref >60)
Glucose, Bld: 122 mg/dL — ABNORMAL HIGH (ref 65–99)
Potassium: 3.9 mmol/L (ref 3.5–5.1)
SODIUM: 140 mmol/L (ref 135–145)

## 2015-12-22 LAB — I-STAT ARTERIAL BLOOD GAS, ED
Acid-base deficit: 5 mmol/L — ABNORMAL HIGH (ref 0.0–2.0)
BICARBONATE: 20.8 meq/L (ref 20.0–24.0)
O2 Saturation: 96 %
PCO2 ART: 38.6 mmHg (ref 35.0–45.0)
PO2 ART: 89 mmHg (ref 80.0–100.0)
Patient temperature: 98.5
TCO2: 22 mmol/L (ref 0–100)
pH, Arterial: 7.34 — ABNORMAL LOW (ref 7.350–7.450)

## 2015-12-22 MED ORDER — LORAZEPAM 2 MG/ML IJ SOLN
1.0000 mg | Freq: Once | INTRAMUSCULAR | Status: AC
  Administered 2015-12-22: 1 mg via INTRAVENOUS
  Filled 2015-12-22: qty 1

## 2015-12-22 MED ORDER — LIDOCAINE HCL (PF) 4 % IJ SOLN
3.0000 mL | Freq: Once | INTRAMUSCULAR | Status: DC
  Filled 2015-12-22: qty 5

## 2015-12-22 MED ORDER — HYDROCODONE-ACETAMINOPHEN 5-325 MG PO TABS
1.0000 | ORAL_TABLET | Freq: Once | ORAL | Status: AC
  Administered 2015-12-22: 1 via ORAL
  Filled 2015-12-22: qty 1

## 2015-12-22 MED ORDER — ALBUTEROL SULFATE (2.5 MG/3ML) 0.083% IN NEBU
2.5000 mg | INHALATION_SOLUTION | Freq: Once | RESPIRATORY_TRACT | Status: AC
  Administered 2015-12-22: 2.5 mg via RESPIRATORY_TRACT

## 2015-12-22 MED ORDER — FENTANYL CITRATE (PF) 100 MCG/2ML IJ SOLN
75.0000 ug | Freq: Once | INTRAMUSCULAR | Status: AC
  Administered 2015-12-22: 75 ug via INTRAVENOUS
  Filled 2015-12-22: qty 2

## 2015-12-22 MED ORDER — LIDOCAINE HCL (PF) 1 % IJ SOLN
INTRAMUSCULAR | Status: AC
  Administered 2015-12-22: 3 mL
  Filled 2015-12-22: qty 5

## 2015-12-22 MED ORDER — ALBUTEROL (5 MG/ML) CONTINUOUS INHALATION SOLN
10.0000 mg/h | INHALATION_SOLUTION | RESPIRATORY_TRACT | Status: DC
  Administered 2015-12-22: 10 mg/h via RESPIRATORY_TRACT
  Filled 2015-12-22: qty 20

## 2015-12-22 MED ORDER — IPRATROPIUM BROMIDE 0.02 % IN SOLN: 0.5000 mg | Freq: Once | RESPIRATORY_TRACT | Status: DC

## 2015-12-22 MED ORDER — ALBUTEROL SULFATE (2.5 MG/3ML) 0.083% IN NEBU
INHALATION_SOLUTION | RESPIRATORY_TRACT | Status: AC
  Administered 2015-12-22: 2.5 mg
  Filled 2015-12-22: qty 6

## 2015-12-22 MED ORDER — IPRATROPIUM BROMIDE 0.02 % IN SOLN
RESPIRATORY_TRACT | Status: AC
  Administered 2015-12-22: 0.5 mg
  Filled 2015-12-22: qty 2.5

## 2015-12-22 MED ORDER — METHYLPREDNISOLONE SODIUM SUCC 125 MG IJ SOLR
125.0000 mg | Freq: Once | INTRAMUSCULAR | Status: AC
  Administered 2015-12-22: 125 mg via INTRAVENOUS
  Filled 2015-12-22: qty 2

## 2015-12-22 MED ORDER — KETAMINE HCL-SODIUM CHLORIDE 100-0.9 MG/10ML-% IV SOSY
0.3000 mg/kg | PREFILLED_SYRINGE | Freq: Once | INTRAVENOUS | Status: AC
  Administered 2015-12-22: 28 mg via INTRAVENOUS
  Filled 2015-12-22: qty 10

## 2015-12-22 MED ORDER — ALBUTEROL SULFATE HFA 108 (90 BASE) MCG/ACT IN AERS
2.0000 | INHALATION_SPRAY | RESPIRATORY_TRACT | Status: DC | PRN
  Filled 2015-12-22: qty 6.7

## 2015-12-22 NOTE — ED Notes (Signed)
RT called to bedside.

## 2015-12-22 NOTE — Progress Notes (Addendum)
RT NOTE:  Cold saline nebulized to patient per Dr. Rhunette CroftNanavati verbal order.   Pt request Albuterol inhaler for discharge. Dr. Rhunette CroftNanavati ok with this.

## 2015-12-22 NOTE — ED Notes (Signed)
Per Triage Rn, Julie Ahmadihristina G.,  Pt arrives to ED via private vehicle unable to speak with auditory wheezes & labored breathing, pt brought to Trauma Room C, RT at bedside, MD at bedside

## 2015-12-22 NOTE — Progress Notes (Signed)
RT NOTE:  ABG results reported to Dr. Nanavati.  

## 2015-12-22 NOTE — ED Provider Notes (Signed)
CSN: [id number]     Arrival date & time 12/22/15  1714 History   First MD Initiated Contact with Patient 12/22/15 1717     Chief Complaint  Patient presents with  . Shortness of Breath    Patient is a 40 y.o. female presenting with shortness of breath. The history is provided by the patient and the EMS personnel.  Shortness of Breath Severity:  Severe Onset quality:  Gradual Duration:  2 days Timing:  Constant Progression:  Worsening Chronicity:  Recurrent Context: emotional upset (very frequently exacerbated by anxiety, she ran out of xanax prior to onset)   Context: not pollens and not URI   Exacerbated by: anxiety. Associated symptoms: sore throat and wheezing   Associated symptoms: no abdominal pain, no chest pain, no cough, no diaphoresis, no fever, no neck pain, no rash, no sputum production and no vomiting   Risk factors: no hx of PE/DVT   Risk factors comment:  Vocal cord dysfunction, prior intubations and tracheostomy; asthma; anxiety  Came in through triage for dyspnea and wheezing.  Level V exception as pt is only able to speak in 3-4 word phrases and can not contribute much to history due to hysteria.    When asked what makes  Her feel better she says pain meds and anxiety meds.    Past Medical History  Diagnosis Date  . Asthma   . Anxiety   . Bronchitis   . Heart murmur   . Shortness of breath   . Hypertension   . Vocal cord dysfunction   . Chronic narcotic dependence [facility])    Past Surgical History  Procedure Laterality Date  . Spinal fusion  11/2012    Performed in [facility] by Dr. Mayford Knife  . Tracheostomy  December 2015   Family History  Problem Relation Age of Onset  . HIV Mother   . Heart disease Father   . CVA Father   . Heart disease Other   . Emphysema Maternal Grandmother     smoked  . Asthma Sister   . Clotting disorder Sister   . Clotting disorder Maternal Grandmother   . Lung cancer Maternal Grandmother     smoked   Social History   Substance Use Topics  . Smoking status: Former Smoker -- 2.00 packs/day for 17 years    Types: Cigarettes    Quit date: 06/15/2014  . Smokeless tobacco: Never Used  . Alcohol Use: 0.0 oz/week    0 Standard drinks or equivalent per week     Comment: occasional   OB History    No data available     Review of Systems  Constitutional: Negative for fever, chills and diaphoresis.  HENT: Positive for sore throat. Negative for congestion, trouble swallowing (had something to drink earlier in day) and voice change.   Respiratory: Positive for shortness of breath, wheezing and stridor. Negative for cough and sputum production.   Cardiovascular: Negative for chest pain and leg swelling.  Gastrointestinal: Negative for nausea, vomiting and abdominal pain.  Musculoskeletal: Negative for back pain and neck pain.  Skin: Negative for rash.  All other systems reviewed and are negative.     Allergies  Robitussin dm; Chocolate; Suboxone; Nsaids; and Rayon, purified  Home Medications   Prior to Admission medications   Medication Sig Start Date End Date Taking? Authorizing Provider  ALPRAZolam Prudy Feeler) 0.5 MG tablet Take 1 tablet (0.5 mg total) by mouth 3 (three) times daily as needed for anxiety. 12/02/15   Luisa Hart  Vivi MartensE Wright, MD  benzonatate (TESSALON) 100 MG capsule Take 1 capsule (100 mg total) by mouth 3 (three) times daily as needed for cough. Patient not taking: Reported on 12/03/2015 12/02/15   Storm FriskPatrick E Wright, MD  budesonide-formoterol [facility](SYMBICORT) 80-4.5 MCG/ACT inhaler Inhale 2 puffs into the lungs 2 (two) times daily. 12/07/15   Jaclyn ShaggyEnobong Amao, MD  ENSURE PLUS (ENSURE PLUS) LIQD Take 237 mLs by mouth 3 (three) times daily between meals. Reported on 12/02/2015    Historical Provider, MD  fluticasone (FLONASE) 50 MCG/ACT nasal spray Place 2 sprays into both nostrils 2 (two) times daily. 12/02/15   Storm FriskPatrick E Wright, MD  gabapentin (NEURONTIN) 300 MG capsule Take 2 capsules (600 mg total) by mouth 2  (two) times daily. 12/03/15   Jaclyn ShaggyEnobong Amao, MD  ipratropium-albuterol (DUONEB) 0.5-2.5 (3) MG/3ML SOLN Take 3 mLs by nebulization every 4 (four) hours as needed (shortness of breath/wheezing). 12/02/15   Storm FriskPatrick E Wright, MD  lactulose (CHRONULAC) 10 GM/15ML solution Take 30 mLs (20 g total) by mouth 2 (two) times daily as needed for mild constipation. Patient not taking: Reported on 09/28/2015 09/01/15   Joseph ArtJessica U Vann, DO  methocarbamol (ROBAXIN) 500 MG tablet Take 2 tablets (1,000 mg total) by mouth 4 (four) times daily. 12/03/15   Jaclyn ShaggyEnobong Amao, MD  montelukast (SINGULAIR) 10 MG tablet Take 10 mg by mouth at bedtime. Reported on 12/02/2015    Historical Provider, MD  morphine (MSIR) 30 MG tablet Take 30 mg by mouth every 12 (twelve) hours. Reported on 12/03/2015    Historical Provider, MD  oxyCODONE (ROXICODONE) 15 MG immediate release tablet Take 1 tablet (15 mg total) by mouth every 4 (four) hours. Take every day per patient Patient not taking: Reported on 12/02/2015 11/07/15   Osvaldo ShipperGokul Krishnan, MD  pantoprazole (PROTONIX) 40 MG tablet Take 1 tablet (40 mg total) by mouth daily at 12 noon. 09/01/15   Joseph ArtJessica U Vann, DO  predniSONE (DELTASONE) 10 MG tablet Take 4 for three days 3 for three days 2 for three days 1 for three days and stop 12/02/15   Storm FriskPatrick E Wright, MD  QUEtiapine (SEROQUEL) 100 MG tablet Take 1 tablet (100 mg total) by mouth 2 (two) times daily. Patient not taking: Reported on 12/02/2015 09/01/15   Joseph ArtJessica U Vann, DO  zolpidem (AMBIEN) 10 MG tablet Take 10 mg by mouth at bedtime as needed for sleep. Reported on 12/03/2015 04/30/15   Historical Provider, MD   BP 126/75 mmHg  Pulse 92  Temp(Src) 98.9 F (37.2 C) (Axillary)  Resp 26  Wt 94.3 kg  SpO2 99%  LMP  Physical Exam  Constitutional: She is oriented to person, place, and time. She appears well-developed and well-nourished. She appears distressed.  HENT:  Head: Normocephalic and atraumatic.  Nose: Nose normal.  Pharynx clear,  tonsils 2+ equal, uvula midline,  Stridor stops with her opening mouth and saying "AH"  Eyes: Conjunctivae are normal.  Neck: Normal range of motion. Neck supple. No tracheal deviation present.  Old trach site  Cardiovascular: Normal rate, regular rhythm and normal heart sounds.   No murmur heard. Pulmonary/Chest: She is in respiratory distress. She has no rales.  Upper airway sounds reverberate; stridulous. Appears to have some control over stridor as she can speak phrases and stridor subsists.  Air entry adequate bilaterally with minimal wheezing at bases. No costal retractions   Abdominal: Soft. Bowel sounds are normal. She exhibits no distension and no mass. There is no tenderness.  Musculoskeletal: Normal  range of motion. She exhibits no edema.  No lower extremity edema, calf tenderness, warmth, erythema or palpable cords   Neurological: She is alert and oriented to person, place, and time.  Skin: Skin is warm and dry. No rash noted.  Psychiatric:  Very anxious, tremors, tearful  Nursing note and vitals reviewed.   ED Course  Procedures (including critical care time) Labs Review Labs Reviewed  BASIC METABOLIC PANEL - Abnormal; Notable for the following:    CO2 21 (*)    Glucose, Bld 122 (*)    All other components within normal limits  CBC WITH DIFFERENTIAL/PLATELET - Abnormal; Notable for the following:    Hemoglobin 11.4 (*)    MCV 75.8 (*)    MCH 23.6 (*)    RDW 19.1 (*)    Platelets 452 (*)    All other components within normal limits  I-STAT VENOUS BLOOD GAS, ED - Abnormal; Notable for the following:    pH, Ven 7.342 (*)    pO2, Ven 93.0 (*)    Bicarbonate 25.3 (*)    All other components within normal limits  I-STAT ARTERIAL BLOOD GAS, ED - Abnormal; Notable for the following:    pH, Arterial 7.340 (*)    Acid-base deficit 5.0 (*)    All other components within normal limits  BLOOD GAS, VENOUS  BLOOD GAS, ARTERIAL    Imaging Review Dg Chest Port 1  View  12/22/2015  CLINICAL DATA:  Respiratory distress. Wheezing uncontrollably EXAM: PORTABLE CHEST - 1 VIEW COMPARISON:  11/04/2015 FINDINGS: Relatively low lung volumes. Improved aeration in the lung bases. No focal infiltrate or overt edema. Heart size upper limits normal for technique. No pneumothorax. No effusion. Visualized skeletal structures are unremarkable. IMPRESSION: Low volumes, no definite acute disease. Electronically Signed   By: Corlis Leak M.D.   On: 12/22/2015 17:44   I have personally reviewed and evaluated these images and lab results as part of my medical decision-making.   EKG Interpretation None      MDM   Final diagnoses:  Stridor  Acute respiratory distress (HCC)  Anxiety and depression  Vocal cord dysfunction    I have reviewed specific past mental history of similar presentations in the setting of anxiety and need for pain control. Airway intact. Stridor appears volitional as she is able. Stridor with speaking. Scant wheezes in her lung bases. Given her distress gave albuterol and steroids in case there is a reactive airway component. Chest x-ray without evidence pneumothorax or pneumonia. Oral exam unremarkable for swelling or source of stridor. Patient perseverates on narcotic medication administration and says his only thing that makes her stop stridor. I considered BiPAP however patient states she will return off as consistent with passed chart review. Serial blood gas monitoring without evidence of problems with oxygenation or ventilation. Labs unremarkable.  7:31 PM speaking in phrases for which stridor stops, no oral swelling. It appears she has some contorl over the stridor.  Air entry adequate without significant wheezing. Stridor persists. O2 sats remain 100%.   On serial examinations patient was noted to be with increased stridor on provider entering the room but would not be as present on walk by room.  Vitals normalized with analgesia.  She again  repeatedly asks to be written for narcotics.    On exam at discharge patient had excellent air entry without focal breath sounds. Stridor was wearing intermittently committed and would stop with vocalization. She was able to drink soda at discharge. Vital signs  stable. Counseled the patient on signs and symptoms were to return. She'll not be prescribing narcotics despite repeated requests. She'll see PCP this week.      Sofie Rower, MD 12/22/15 1610  Derwood Kaplan, MD 12/23/15 [phone]

## 2016-01-04 ENCOUNTER — Telehealth: Payer: Self-pay | Admitting: Family Medicine

## 2016-01-04 NOTE — Telephone Encounter (Signed)
Pt. Called requesting a refill on the following medications:  ALPRAZolam (XANAX) 0.5 MG tablet Tramadol  Please f/u with pt.

## 2016-01-20 ENCOUNTER — Telehealth: Payer: Self-pay | Admitting: Family Medicine

## 2016-01-20 NOTE — Telephone Encounter (Signed)
Patient called and stated that the pain management clinic she was referred to cannot see her due to not [facility]. Patient needs to be referred to a different pain management clinic.   Please follow up.

## 2016-01-20 NOTE — Telephone Encounter (Signed)
On 01/05/16  Sent referral to [facility]. # [phone] Address [address] [place], [place] [address].

## 2016-02-03 ENCOUNTER — Encounter: Payer: Self-pay | Admitting: Critical Care Medicine

## 2016-02-03 ENCOUNTER — Ambulatory Visit: Payer: Medicare HMO | Attending: Critical Care Medicine | Admitting: Critical Care Medicine

## 2016-02-03 VITALS — BP 110/74 | HR 90 | Temp 98.9°F | Resp 18 | Ht 64.0 in | Wt 198.0 lb

## 2016-02-03 DIAGNOSIS — K219 Gastro-esophageal reflux disease without esophagitis: Secondary | ICD-10-CM | POA: Insufficient documentation

## 2016-02-03 DIAGNOSIS — R058 Other specified cough: Secondary | ICD-10-CM

## 2016-02-03 DIAGNOSIS — J449 Chronic obstructive pulmonary disease, unspecified: Secondary | ICD-10-CM | POA: Diagnosis present

## 2016-02-03 DIAGNOSIS — G894 Chronic pain syndrome: Secondary | ICD-10-CM | POA: Insufficient documentation

## 2016-02-03 DIAGNOSIS — F418 Other specified anxiety disorders: Secondary | ICD-10-CM | POA: Diagnosis not present

## 2016-02-03 DIAGNOSIS — J4550 Severe persistent asthma, uncomplicated: Secondary | ICD-10-CM | POA: Diagnosis not present

## 2016-02-03 DIAGNOSIS — J411 Mucopurulent chronic bronchitis: Secondary | ICD-10-CM

## 2016-02-03 DIAGNOSIS — R05 Cough: Secondary | ICD-10-CM

## 2016-02-03 DIAGNOSIS — Z79899 Other long term (current) drug therapy: Secondary | ICD-10-CM | POA: Insufficient documentation

## 2016-02-03 DIAGNOSIS — Z8249 Family history of ischemic heart disease and other diseases of the circulatory system: Secondary | ICD-10-CM | POA: Diagnosis not present

## 2016-02-03 DIAGNOSIS — J45909 Unspecified asthma, uncomplicated: Secondary | ICD-10-CM

## 2016-02-03 DIAGNOSIS — Z87891 Personal history of nicotine dependence: Secondary | ICD-10-CM | POA: Insufficient documentation

## 2016-02-03 DIAGNOSIS — F32A Depression, unspecified: Secondary | ICD-10-CM

## 2016-02-03 DIAGNOSIS — F419 Anxiety disorder, unspecified: Secondary | ICD-10-CM

## 2016-02-03 DIAGNOSIS — F329 Major depressive disorder, single episode, unspecified: Secondary | ICD-10-CM

## 2016-02-03 MED ORDER — PANTOPRAZOLE SODIUM 40 MG PO TBEC: 40.0000 mg | DELAYED_RELEASE_TABLET | Freq: Every day | ORAL | Status: DC

## 2016-02-03 MED ORDER — GABAPENTIN 300 MG PO CAPS: 600.0000 mg | ORAL_CAPSULE | Freq: Two times a day (BID) | ORAL | Status: DC

## 2016-02-03 MED ORDER — METHOCARBAMOL 500 MG PO TABS: 1000.0000 mg | ORAL_TABLET | Freq: Four times a day (QID) | ORAL | Status: DC

## 2016-02-03 MED ORDER — ALPRAZOLAM 0.5 MG PO TABS: 0.5000 mg | ORAL_TABLET | Freq: Three times a day (TID) | ORAL | Status: DC | PRN

## 2016-02-03 MED ORDER — BUDESONIDE-FORMOTEROL FUMARATE 80-4.5 MCG/ACT IN AERO: 2.0000 | INHALATION_SPRAY | Freq: Two times a day (BID) | RESPIRATORY_TRACT | Status: DC

## 2016-02-03 MED ORDER — FLUTICASONE PROPIONATE 50 MCG/ACT NA SUSP: 2.0000 | Freq: Two times a day (BID) | NASAL | Status: DC

## 2016-02-03 MED ORDER — ALBUTEROL SULFATE HFA 108 (90 BASE) MCG/ACT IN AERS: 2.0000 | INHALATION_SPRAY | Freq: Four times a day (QID) | RESPIRATORY_TRACT | Status: DC | PRN

## 2016-02-03 MED FILL — SYMBICORT 80-4.5 MCG INH: 80-4.5 | 30 days supply | Qty: 10 | Fill #0

## 2016-02-03 MED FILL — VENTOLIN HFA 90 MCG INHALER: 108 (90 BAS | 28 days supply | Qty: 18 | Fill #0

## 2016-02-03 MED FILL — GABAPENTIN 300 MG CAPSULE: 300 | 30 days supply | Qty: 120 | Fill #0

## 2016-02-03 MED FILL — FLUTICASONE PROP 50 MCG SPR: 50 | 30 days supply | Qty: 16 | Fill #0

## 2016-02-03 MED FILL — PANTOPRAZOLE SOD DR 40 MG T: 40 | 30 days supply | Qty: 30 | Fill #0

## 2016-02-03 MED FILL — METHOCARBAMOL 500 MG TABLET: 500 | 30 days supply | Qty: 120 | Fill #0

## 2016-02-03 NOTE — Assessment & Plan Note (Signed)
Severe persistent asthma,  Lack of med access limits care.  Pt ran out of LABA/ICS and nasal steroid now is very tight and has nasal inflammation.  Needs to be back on maintenance meds.  Also VCD issue with severe anxiety leading to multiple admits. Anxiolytics are needed here as a mainstay. Plan  Refill symbicort 160 two puff bid Refill SABA prn Refill fluticasone No systemic steroids needed at present Resume xanax 0.5mg  tid prn, #90 given

## 2016-02-03 NOTE — Assessment & Plan Note (Signed)
Chronic pain syndrome Being managed by pcp Now on gabapentin and robaxin for low back issues I declined to Rx tramadol Pt out of gabapentin and robaxin. i refilled but told pt to f/u with pcp on pain mgmt

## 2016-02-03 NOTE — Progress Notes (Signed)
Subjective:    Patient ID: Julie Kerr, female    DOB: 07/06/1976, 40 y.o.   MRN: [id number]  HPI   12/02/2015  Chief Complaint  Patient presents with  . Hospitalization Follow-up    Respiratory Failure  Post hosp f/u  Pt wit hTrach previously for 4months. Hx of severe VCD. Dx of this end of last year.   Hx of copd and asthma and VCD anxiety. No smoking now. Pt in hosp 10/2015 and rx bipap, see above Adm 3/22 and 3/25 Uses oxygen at night Pt with still cough.  Cough is prod thick green and yellow and occ blood.   Notes some green out of nose and gray. Robaxin and neurontin helps and tramadol help Nasal spray, proair, dulera are all out   02/03/2016 Chief Complaint  Patient presents with  . URI  Here for pulm f/u.  Severe asthma/ chronic pain/chronic anxiety/VCD severe.   Freq hosp stays/visits. Back in ED 12/2015.  Xanax /inhalers/tramadol all ran out and back to ED.   Notes some cough, throat continues to feel tight. Cough is prod yellow. Notes wheezing. Now out of inhalers.   Notes dysphagia   No fever.  Chest feels tight with pressure and sharp pain Uses oxygen qhs no smoking now    Review of Systems  Constitutional: Positive for fatigue.  HENT: Positive for congestion, ear discharge, postnasal drip, sinus pressure, trouble swallowing and voice change.   Respiratory: Positive for cough, choking, chest tightness and stridor.    Past Medical History  Diagnosis Date  . Asthma   . Anxiety   . Bronchitis   . Heart murmur   . Shortness of breath   . Hypertension   . Vocal cord dysfunction   . Chronic narcotic dependence (HCC)      Family History  Problem Relation Age of Onset  . HIV Mother   . Heart disease Father   . CVA Father   . Heart disease Other   . Emphysema Maternal Grandmother     smoked  . Asthma Sister   . Clotting disorder Sister   . Clotting disorder Maternal Grandmother   . Lung cancer Maternal Grandmother     smoked     Social History    Social History  . Marital Status: Single    Spouse Name: N/A  . Number of Children: N/A  . Years of Education: N/A   Occupational History  . Not on file.   Social History Main Topics  . Smoking status: Former Smoker -- 0.00 packs/day for 17 years    Types: Cigarettes    Quit date: 06/15/2014  . Smokeless tobacco: Never Used  . Alcohol Use: 0.0 oz/week    0 Standard drinks or equivalent per week     Comment: occasional  . Drug Use: No  . Sexual Activity: Yes    Birth Control/ Protection: Other-see comments     Comment: female partners only   Other Topics Concern  . Not on file   Social History Narrative     Allergies  Allergen Reactions  . Robitussin Dm [Dextromethorphan-Guaifenesin] Nausea And Vomiting  . Chocolate Hives  . Suboxone [Buprenorphine Hcl-Naloxone Hcl] Other (See Comments)    Aggressive behavior  . Nsaids Hives  . Rayon, Purified Hives     Outpatient Prescriptions Prior to Visit  Medication Sig Dispense Refill  . ipratropium-albuterol (DUONEB) 0.5-2.5 (3) MG/3ML SOLN Take 3 mLs by nebulization every 4 (four) hours as needed (shortness of breath/wheezing). 360  mL 2  . zolpidem (AMBIEN) 10 MG tablet Take 10 mg by mouth at bedtime as needed for sleep. Reported on 12/03/2015  0  . ALPRAZolam (XANAX) 0.5 MG tablet Take 1 tablet (0.5 mg total) by mouth 3 (three) times daily as needed for anxiety. (Patient not taking: Reported on 02/03/2016) 60 tablet 0  . benzonatate (TESSALON) 100 MG capsule Take 1 capsule (100 mg total) by mouth 3 (three) times daily as needed for cough. (Patient not taking: Reported on 12/03/2015) 60 capsule 4  . budesonide-formoterol (SYMBICORT) 80-4.5 MCG/ACT inhaler Inhale 2 puffs into the lungs 2 (two) times daily. (Patient not taking: Reported on 02/03/2016) 1 Inhaler 3  . ENSURE PLUS (ENSURE PLUS) LIQD Take 237 mLs by mouth 3 (three) times daily between meals. Reported on 12/02/2015    . fluticasone (FLONASE) 50 MCG/ACT nasal spray Place 2  sprays into both nostrils 2 (two) times daily. (Patient not taking: Reported on 02/03/2016) 16 g 6  . gabapentin (NEURONTIN) 300 MG capsule Take 2 capsules (600 mg total) by mouth 2 (two) times daily. (Patient not taking: Reported on 02/03/2016) 120 capsule 3  . lactulose (CHRONULAC) 10 GM/15ML solution Take 30 mLs (20 g total) by mouth 2 (two) times daily as needed for mild constipation. (Patient not taking: Reported on 09/28/2015) 240 mL 0  . methocarbamol (ROBAXIN) 500 MG tablet Take 2 tablets (1,000 mg total) by mouth 4 (four) times daily. (Patient not taking: Reported on 02/03/2016) 120 tablet 3  . montelukast (SINGULAIR) 10 MG tablet Take 10 mg by mouth at bedtime. Reported on 02/03/2016    . morphine (MSIR) 30 MG tablet Take 30 mg by mouth every 12 (twelve) hours. Reported on 12/03/2015    . oxyCODONE (ROXICODONE) 15 MG immediate release tablet Take 1 tablet (15 mg total) by mouth every 4 (four) hours. Take every day per patient (Patient not taking: Reported on 12/02/2015) 30 tablet 0  . pantoprazole (PROTONIX) 40 MG tablet Take 1 tablet (40 mg total) by mouth daily at 12 noon. (Patient not taking: Reported on 02/03/2016)    . predniSONE (DELTASONE) 10 MG tablet Take 4 for three days 3 for three days 2 for three days 1 for three days and stop 30 tablet 0  . QUEtiapine (SEROQUEL) 100 MG tablet Take 1 tablet (100 mg total) by mouth 2 (two) times daily. (Patient not taking: Reported on 12/02/2015)     No facility-administered medications prior to visit.         Objective:   Physical Exam  Filed Vitals:   02/03/16 1013  BP: 110/74  Pulse: 90  Temp: 98.9 F (37.2 C)  TempSrc: Oral  Resp: 18  Height:  (1.626 m)  Weight: 198 lb (89.812 kg)  SpO2: 97%    Gen: Pleasant, obese , in no distress, anxious  affect  ENT: No lesions,  mouth clear,  Oropharynx clear, nasal turbinate edema,  ++postnasal drip  Neck: No JVD, no TMG, no carotid bruits  Trach site healed   Lungs: No use of  accessory muscles, no dullness to percussion,insp exp wheeze persists  Cardiovascular: RRR, heart sounds normal, no murmur or gallops, no peripheral edema  Abdomen: soft and NT, no HSM,  BS normal  Musculoskeletal: No deformities, no cyanosis or clubbing  Neuro: alert, non focal  Skin: Warm, no lesions or rashes  No results found.  Recent ED visit labs/xrays reviewed      Assessment & Plan:  I personally reviewed all images and  lab data in the [facility] system as well as any outside material available during this office visit and agree with the  radiology impressions.   Chronic asthmatic bronchitis (HCC) Severe persistent asthma,  Lack of med access limits care.  Pt ran out of LABA/ICS and nasal steroid now is very tight and has nasal inflammation.  Needs to be back on maintenance meds.  Also VCD issue with severe anxiety leading to multiple admits. Anxiolytics are needed here as a mainstay. Plan  Refill symbicort 160 two puff bid Refill SABA prn Refill fluticasone No systemic steroids needed at present Resume xanax 0.5mg  tid prn, #90 given  COPD (chronic obstructive pulmonary disease) With smoking hx copd component Needs maintanence symbicort  Anxiety and depression Severe anxiety driving VCD Refilled xanax  Upper airway cough syndrome, severe, with clinical VCD VCD severe Needs to be on xanax for VCD component   Chronic pain syndrome Chronic pain syndrome Being managed by pcp Now on gabapentin and robaxin for low back issues I declined to Rx tramadol Pt out of gabapentin and robaxin. i refilled but told pt to f/u with pcp on pain mgmt  GERD (gastroesophageal reflux disease) Gerd flare Needs to be on PPI daily to help reflux that ppt VCD attacks    Mylena was seen today for uri.  Diagnoses and all orders for this visit:  Chronic pain syndrome -     gabapentin (NEURONTIN) 300 MG capsule; Take 2 capsules (600 mg total) by mouth 2 (two) times daily. -      methocarbamol (ROBAXIN) 500 MG tablet; Take 2 tablets (1,000 mg total) by mouth 4 (four) times daily.  Chronic asthmatic bronchitis (HCC)  Mucopurulent chronic bronchitis (HCC)  Anxiety and depression  Upper airway cough syndrome, severe, with clinical VCD  Gastroesophageal reflux disease without esophagitis  Other orders -     ALPRAZolam (XANAX) 0.5 MG tablet; Take 1 tablet (0.5 mg total) by mouth 3 (three) times daily as needed for anxiety. -     budesonide-formoterol (SYMBICORT) 80-4.5 MCG/ACT inhaler; Inhale 2 puffs into the lungs 2 (two) times daily. -     fluticasone (FLONASE) 50 MCG/ACT nasal spray; Place 2 sprays into both nostrils 2 (two) times daily. -     pantoprazole (PROTONIX) 40 MG tablet; Take 1 tablet (40 mg total) by mouth daily at 12 noon. -     albuterol (PROVENTIL HFA;VENTOLIN HFA) 108 (90 Base) MCG/ACT inhaler; Inhale 2 puffs into the lungs every 6 (six) hours as needed for wheezing or shortness of breath.

## 2016-02-03 NOTE — Assessment & Plan Note (Signed)
VCD severe Needs to be on xanax for VCD component

## 2016-02-03 NOTE — Patient Instructions (Signed)
Xanax refilled take three times daily as needed Robaxin and Gabapentin refilled.  I will communicate with your Primary Care MD about your pain management Symbicort and albuterol refilled Fluticasone/ nasal spray refilled protonix for stomach given All meds sent to our pharmacy for access Return 1 month

## 2016-02-03 NOTE — Progress Notes (Signed)
Patient is here for URI FU  Patient complains of lower back pain being present scaled currently at a 10.  Patient complains of SOB, Wheezing and Edema being present in her throat.  Patient denies any suicidal ideations at this time.

## 2016-02-03 NOTE — Assessment & Plan Note (Signed)
Gerd flare Needs to be on PPI daily to help reflux that ppt VCD attacks

## 2016-02-03 NOTE — Assessment & Plan Note (Signed)
With smoking hx copd component Needs maintanence symbicort

## 2016-02-03 NOTE — Assessment & Plan Note (Signed)
Severe anxiety driving VCD Refilled xanax

## 2016-03-07 MED FILL — GABAPENTIN 300 MG CAPSULE: 300 | 30 days supply | Qty: 120 | Fill #1

## 2016-03-07 MED FILL — METHOCARBAMOL 500 MG TABLET: 500 | 30 days supply | Qty: 120 | Fill #1

## 2016-10-04 ENCOUNTER — Telehealth: Payer: Self-pay | Admitting: Family Medicine

## 2016-10-04 NOTE — Telephone Encounter (Signed)
Velna HatchetSheila, [facility], called to speak PCP regarding an order for delay and start care for home service for 2/22 due to end of appointment for 2/21. Please follow up.  Thank you

## 2016-10-06 ENCOUNTER — Telehealth (HOSPITAL_COMMUNITY): Payer: Self-pay

## 2016-10-06 ENCOUNTER — Telehealth: Payer: Self-pay | Admitting: Family Medicine

## 2016-10-06 NOTE — Telephone Encounter (Signed)
10/06/2016  Attempted to call patient to schedule for new pharmacy service regarding inhaler education and technique. Left HIPAA compliant message with clinic phone number to call to schedule and Dr. Bufford LopeHammer's phone number for more information regarding service.  Allie BossierApryl Rosamary Boudreau, PharmD PGY1 Pharmacy Resident [phone] (Pager) 10/06/2016 3:15 PM

## 2016-10-06 NOTE — Telephone Encounter (Signed)
Britney from [facility] called the office to get verbal orders from PCP start of care for patient. Please follow up.   Thank you.

## 2016-10-11 NOTE — Telephone Encounter (Signed)
This patient has you listed as her PCP. Dr. Delford FieldWright saw her for a respiratory concern from an ED visit. He will not be able to provide VO's for start of care because he will not be monitoring the treatment or reports, as he is not the PCP. Please advise on this matter.

## 2016-10-11 NOTE — Telephone Encounter (Signed)
Addressed.

## 2016-10-11 NOTE — Telephone Encounter (Signed)
Ok

## 2016-10-13 NOTE — Telephone Encounter (Signed)
Msg sent to Dr. Venetia NightAmao, she is aware.

## 2016-10-25 ENCOUNTER — Telehealth: Payer: Self-pay

## 2016-10-25 MED ORDER — BUDESONIDE-FORMOTEROL FUMARATE 80-4.5 MCG/ACT IN AERO: 2.0000 | INHALATION_SPRAY | Freq: Two times a day (BID) | RESPIRATORY_TRACT | 0 refills | Status: DC

## 2016-10-25 MED ORDER — IPRATROPIUM-ALBUTEROL 0.5-2.5 (3) MG/3ML IN SOLN: 3.0000 mL | RESPIRATORY_TRACT | 0 refills | Status: DC | PRN

## 2016-10-25 MED ORDER — ALBUTEROL SULFATE HFA 108 (90 BASE) MCG/ACT IN AERS: 2.0000 | INHALATION_SPRAY | Freq: Four times a day (QID) | RESPIRATORY_TRACT | 0 refills | Status: DC | PRN

## 2016-10-25 NOTE — Addendum Note (Signed)
Addended by: Santa LighterHAMMER, Eduarda Scrivens K on: 10/25/2016 04:59 PM   Modules accepted: Orders

## 2016-10-25 NOTE — Telephone Encounter (Signed)
10/25/2016  Called Julie Kerr  to schedule an appointment with Viann FishStacey Hammer or myself for a pharmacy-led inhaler education initiative at [facility]. She states that [place] of the [place] at her sister's place for help taking care of herself. She is out of all of her inhalers, and rates her SOB at 7/10. She has tried 1 albuterol neb without relief 20 minutes ago. I instructed her to take another neb treatment and call the clinic if she has no relief. She asked if it was possible to have medications called in to a pharmacy near her. Will pass this note along to the clinic pharmacist, Dr. Lewie ChamberHammer.   Allie BossierApryl Ketzaly Cardella, PharmD PGY1 Pharmacy Resident [phone] (Pager)

## 2016-10-25 NOTE — Telephone Encounter (Signed)
Unable to contact patient or leave message on either line. Will send refills to [facility] which can be transferred closer to her since I do not know where she is currently located but [facility] will notify her when the prescription is filled. We have not received a call back from patient but if she has continued difficulty breathing she must go to a local emergency room. Will make Dr. Venetia NightAmao aware.

## 2016-10-26 ENCOUNTER — Telehealth: Payer: Self-pay | Admitting: Pharmacist

## 2016-10-26 NOTE — Telephone Encounter (Signed)
Tried patient again today but unable to reach her. Agree with CPP, she will need to go to the ED closest to her if dyspneic.

## 2016-10-26 NOTE — Telephone Encounter (Signed)
Attempted to contact patient again to follow up. Unable to reach her but was able to leave a message this time instructing her to call back. If patient calls back and reports continued shortness of breath, she needs to go to the emergency room or call 911. Otherwise, she needs to come be seen in clinic.

## 2017-04-10 ENCOUNTER — Emergency Department (HOSPITAL_COMMUNITY): Payer: Medicare HMO

## 2017-04-10 ENCOUNTER — Encounter (HOSPITAL_COMMUNITY): Payer: Self-pay

## 2017-04-10 ENCOUNTER — Emergency Department (HOSPITAL_COMMUNITY)
Admission: EM | Admit: 2017-04-10 | Discharge: 2017-04-10 | Disposition: A | Discharge status: Home / Self Care | Payer: Medicare HMO | Attending: Emergency Medicine | Admitting: Emergency Medicine

## 2017-04-10 DIAGNOSIS — I10 Essential (primary) hypertension: Secondary | ICD-10-CM | POA: Diagnosis not present

## 2017-04-10 DIAGNOSIS — R0602 Shortness of breath: Secondary | ICD-10-CM | POA: Diagnosis present

## 2017-04-10 DIAGNOSIS — J45909 Unspecified asthma, uncomplicated: Secondary | ICD-10-CM | POA: Diagnosis not present

## 2017-04-10 DIAGNOSIS — J441 Chronic obstructive pulmonary disease with (acute) exacerbation: Secondary | ICD-10-CM | POA: Diagnosis not present

## 2017-04-10 DIAGNOSIS — Z79899 Other long term (current) drug therapy: Secondary | ICD-10-CM | POA: Insufficient documentation

## 2017-04-10 DIAGNOSIS — Z87891 Personal history of nicotine dependence: Secondary | ICD-10-CM | POA: Diagnosis not present

## 2017-04-10 LAB — CBC WITH DIFFERENTIAL/PLATELET
BASOS ABS: 0 10*3/uL (ref 0.0–0.1)
BASOS PCT: 0 %
EOS PCT: 1 %
Eosinophils Absolute: 0.1 10*3/uL (ref 0.0–0.7)
HEMATOCRIT: 36.5 % (ref 36.0–46.0)
Hemoglobin: 11.7 g/dL — ABNORMAL LOW (ref 12.0–15.0)
Lymphocytes Relative: 31 %
Lymphs Abs: 3 10*3/uL (ref 0.7–4.0)
MCH: 25.6 pg — ABNORMAL LOW (ref 26.0–34.0)
MCHC: 32.1 g/dL (ref 30.0–36.0)
MCV: 79.9 fL (ref 78.0–100.0)
MONO ABS: 0.5 10*3/uL (ref 0.1–1.0)
MONOS PCT: 5 %
NEUTROS ABS: 6 10*3/uL (ref 1.7–7.7)
Neutrophils Relative %: 63 %
PLATELETS: 320 10*3/uL (ref 150–400)
RBC: 4.57 MIL/uL (ref 3.87–5.11)
RDW: 15 % (ref 11.5–15.5)
WBC: 9.6 10*3/uL (ref 4.0–10.5)

## 2017-04-10 LAB — BASIC METABOLIC PANEL
Anion gap: 9 (ref 5–15)
BUN: 10 mg/dL (ref 6–20)
CALCIUM: 9.2 mg/dL (ref 8.9–10.3)
CO2: 19 mmol/L — AB (ref 22–32)
CREATININE: 0.8 mg/dL (ref 0.44–1.00)
Chloride: 108 mmol/L (ref 101–111)
GFR calc non Af Amer: >60 mL/min (ref >60)
GLUCOSE: 118 mg/dL — AB (ref 65–99)
Potassium: 4.1 mmol/L (ref 3.5–5.1)
Sodium: 136 mmol/L (ref 135–145)

## 2017-04-10 LAB — I-STAT TROPONIN, ED: Troponin i, poc: 0.01 ng/mL (ref 0.00–0.08)

## 2017-04-10 MED ORDER — ALBUTEROL (5 MG/ML) CONTINUOUS INHALATION SOLN
15.0000 mg/h | INHALATION_SOLUTION | Freq: Once | RESPIRATORY_TRACT | Status: AC
  Administered 2017-04-10: 15 mg/h via RESPIRATORY_TRACT
  Filled 2017-04-10: qty 20

## 2017-04-10 MED ORDER — PREDNISONE 10 MG PO TABS: 40.0000 mg | ORAL_TABLET | Freq: Every day | ORAL | 0 refills | Status: AC

## 2017-04-10 MED ORDER — LORAZEPAM 2 MG/ML IJ SOLN
0.5000 mg | Freq: Once | INTRAMUSCULAR | Status: AC
  Administered 2017-04-10: 0.5 mg via INTRAVENOUS

## 2017-04-10 MED ORDER — ALBUTEROL SULFATE HFA 108 (90 BASE) MCG/ACT IN AERS
2.0000 | INHALATION_SPRAY | Freq: Once | RESPIRATORY_TRACT | Status: AC
  Administered 2017-04-10: 2 via RESPIRATORY_TRACT
  Filled 2017-04-10: qty 6.7

## 2017-04-10 MED ORDER — LORAZEPAM 2 MG/ML IJ SOLN
INTRAMUSCULAR | Status: AC
  Filled 2017-04-10: qty 1

## 2017-04-10 MED ORDER — MAGNESIUM SULFATE 2 GM/50ML IV SOLN
2.0000 g | Freq: Once | INTRAVENOUS | Status: AC
  Administered 2017-04-10: 2 g via INTRAVENOUS
  Filled 2017-04-10: qty 50

## 2017-04-10 MED ORDER — METHOCARBAMOL 1000 MG/10ML IJ SOLN: 1000.0000 mg | Freq: Once | INTRAMUSCULAR | Status: DC

## 2017-04-10 MED ORDER — IPRATROPIUM BROMIDE 0.02 % IN SOLN
1.0000 mg | Freq: Once | RESPIRATORY_TRACT | Status: AC
  Administered 2017-04-10: 1 mg via RESPIRATORY_TRACT
  Filled 2017-04-10: qty 5

## 2017-04-10 MED ORDER — METHOCARBAMOL 500 MG PO TABS: 1000.0000 mg | ORAL_TABLET | Freq: Two times a day (BID) | ORAL | 0 refills | Status: DC

## 2017-04-10 MED ORDER — METHOCARBAMOL 1000 MG/10ML IJ SOLN
1000.0000 mg | Freq: Once | INTRAVENOUS | Status: AC
  Administered 2017-04-10: 1000 mg via INTRAVENOUS
  Filled 2017-04-10: qty 10

## 2017-04-10 NOTE — Care Management (Signed)
ED CM consulted concerning patient needing assistance with follow up patient has a history of asthma and intubation. Patient presents to [facility] ED with c/o SOB difficulty breathing by EMS. ED CM met with patient at bedside to discuss follow up at the [place] [facility], patient is agreeable with establishing care at the clinic. Patient states she does not have transportation, CM will notify [employer] Transportation concerning  her pick up and drop off on the day of the appointment. Patient verbalized understanding teach back done and patient given written appointment and transportation information prior to discharge. Updated Dr. Billy Fischer. No further ED CM needs identified.

## 2017-04-10 NOTE — ED Provider Notes (Signed)
[facility]-EMERGENCY DEPT Provider Note   CSN: [id number] Arrival date & time: 04/10/17  1323     History   Chief Complaint Chief Complaint  Patient presents with  . Shortness of Breath    HPI Julie Kerr is a 41 y.o. female.  HPI   67 old female with a history of COPD, asthma, anxiety, chronic pain syndrome, clinical vocal cord dysfunction presents with concern for shortness of breath.  Initial history is limited by patient's respiratory distress.  Patient  later reports increased stress and anxiety today, in setting of being told that she would be unable to follow up with her primary care physician due to their concerns). Insurance. Reports that she is talking with her landlord at the time of developing shortness of breath. Reports that it's been worsening over the last 2 days, while she is oriented with her landlord it became worse, and she was unable to talk and they called EMS. With EMS she was given albuterol, Solu-Medrol and ipratropium without significant improvement.  Reports subjective fevers. Reports sharp left-sided chest pain with cough.  Also reports shooting pain from her back to her left leg. No other leg pain or swelling.  Had 3 days of diarrhea with bright red blood per rectum.  She has been intubated before for COPD, with question raised of severe anxiety component. She had tracheostomy at one time.   Past Medical History:  Diagnosis Date  . Anxiety   . Asthma   . Bronchitis   . Chronic narcotic dependence (HCC)   . Heart murmur   . Hypertension   . Shortness of breath   . Vocal cord dysfunction     Patient Active Problem List   Diagnosis Date Noted  . GERD (gastroesophageal reflux disease) 02/03/2016  . Obesity 08/24/2015  . Tobacco abuse   . Cocaine abuse   . Anxiety and depression   . Substance abuse   . Asthma 07/27/2015  . Frequent falls 05/29/2015  . COPD (chronic obstructive pulmonary disease) (HCC) 05/08/2015  . Alcohol abuse   . Depression   .  Adjustment disorder with anxious mood   . Hereditary and idiopathic peripheral neuropathy   . Chronic asthmatic bronchitis (HCC) 08/21/2014  . Cough   . Polysubstance abuse 07/28/2014  . Generalized anxiety disorder 01/20/2014  . Panic attacks 01/20/2014  . Upper airway cough syndrome, severe, with clinical VCD 12/07/2013  . Stridor 10/29/2013  . Anemia 08/04/2013  . Tobacco use disorder 06/17/2013  . Chronic pain syndrome 06/17/2013    Past Surgical History:  Procedure Laterality Date  . SPINAL FUSION  11/2012   Performed in [facility] by Dr. Mayford Knife  . TRACHEOSTOMY  December 2015    OB History    No data available       Home Medications    Prior to Admission medications   Medication Sig Start Date End Date Taking? Authorizing Provider  albuterol (PROVENTIL HFA;VENTOLIN HFA) 108 (90 Base) MCG/ACT inhaler Inhale 2 puffs into the lungs every 6 (six) hours as needed for wheezing or shortness of breath. 10/25/16  Yes Jaclyn Shaggy, MD  ALPRAZolam Prudy Feeler) 0.5 MG tablet Take 1 tablet (0.5 mg total) by mouth 3 (three) times daily as needed for anxiety. Patient taking differently: Take 1 mg by mouth 3 (three) times daily as needed for anxiety.  02/03/16  Yes Storm Frisk, MD  alteplase (ACTIVASE) 1 mg/mL injection 2 mg by Intracatheter route once as needed.   Yes [provider]  budesonide-formoterol (  SYMBICORT) 80-4.5 MCG/ACT inhaler Inhale 2 puffs into the lungs 2 (two) times daily. 10/25/16  Yes Jaclyn Shaggy, MD  fluticasone (FLONASE) 50 MCG/ACT nasal spray Place 2 sprays into both nostrils 2 (two) times daily. 02/03/16  Yes Storm Frisk, MD  fluticasone furoate-vilanterol (BREO ELLIPTA) 200-25 MCG/INH AEPB Inhale 1 puff into the lungs daily.   Yes [provider]  Fluticasone-Salmeterol (ADVAIR DISKUS) 100-50 MCG/DOSE AEPB Inhale 1 puff into the lungs 2 (two) times daily.   Yes [provider]  gabapentin (NEURONTIN) 300 MG capsule Take 2  capsules (600 mg total) by mouth 2 (two) times daily. Patient taking differently: Take 400 mg by mouth 3 (three) times daily.  02/03/16  Yes Storm Frisk, MD  ipratropium-albuterol (DUONEB) 0.5-2.5 (3) MG/3ML SOLN Take 3 mLs by nebulization every 4 (four) hours as needed (shortness of breath/wheezing). 10/25/16  Yes Jaclyn Shaggy, MD  levofloxacin (LEVAQUIN) 750 MG tablet Take 750 mg by mouth daily.   Yes [provider]  morphine (MSIR) 30 MG tablet Take 30 mg by mouth every 12 (twelve) hours.   Yes [provider]  oxyCODONE (ROXICODONE) 15 MG immediate release tablet Take 15 mg by mouth every 4 (four) hours as needed for pain.   Yes [provider]  pantoprazole (PROTONIX) 40 MG tablet Take 1 tablet (40 mg total) by mouth daily at 12 noon. 02/03/16  Yes Storm Frisk, MD  verapamil (VERELAN) 100 MG 24 hr capsule Take 100 mg by mouth at bedtime.   Yes [provider]  zolpidem (AMBIEN) 10 MG tablet Take 10 mg by mouth at bedtime. Reported on 12/03/2015 04/30/15  Yes [provider]  methocarbamol (ROBAXIN) 500 MG tablet Take 2 tablets (1,000 mg total) by mouth 2 (two) times daily. 04/10/17   Alvira Monday, MD  predniSONE (DELTASONE) 10 MG tablet Take 4 tablets (40 mg total) by mouth daily. 04/10/17 04/14/17  Alvira Monday, MD    Family History Family History  Problem Relation Age of Onset  . HIV Mother   . Heart disease Father   . CVA Father   . Heart disease Other   . Emphysema Maternal Grandmother        smoked  . Clotting disorder Maternal Grandmother   . Lung cancer Maternal Grandmother        smoked  . Asthma Sister   . Clotting disorder Sister     Social History Social History  Substance Use Topics  . Smoking status: Former Smoker    Packs/day: 0.00    Years: 17.00    Types: Cigarettes    Quit date: 06/15/2014  . Smokeless tobacco: Never Used  . Alcohol use 0.0 oz/week     Comment: occasional     Allergies     Robitussin dm [dextromethorphan-guaifenesin]; Chocolate; Suboxone [buprenorphine hcl-naloxone hcl]; Nsaids; Rayon, purified; Tolmetin; and Tramadol   Review of Systems Review of Systems  Unable to perform ROS: Severe respiratory distress  Constitutional: Positive for fever (subjective).  Respiratory: Positive for cough, shortness of breath and wheezing.   Cardiovascular: Positive for chest pain.  Gastrointestinal: Negative for abdominal pain, nausea and vomiting.     Physical Exam Updated Vital Signs BP 112/69   Pulse (!) 102   Temp 99.7 F (37.6 C) (Temporal)   Resp 16   Ht 5\' 5"  (1.651 m)   Wt 97.5 kg (215 lb)   LMP 03/19/2017 (Exact Date)   SpO2 100%   BMI 35.78 kg/m   Physical  Exam  Constitutional: She is oriented to person, place, and time. She appears well-developed and well-nourished. No distress.  HENT:  Head: Normocephalic and atraumatic.  Eyes: Conjunctivae and EOM are normal.  Neck: Normal range of motion.  Cardiovascular: Regular rhythm, normal heart sounds and intact distal pulses.  Tachycardia present.  Exam reveals no gallop and no friction rub.   No murmur heard. Pulmonary/Chest: Accessory muscle usage present. Tachypnea noted. She is in respiratory distress. She has wheezes. She has no rales. She exhibits tenderness.  Abdominal: Soft. She exhibits no distension. There is no tenderness. There is no guarding.  Musculoskeletal: She exhibits no edema or tenderness.  Neurological: She is alert and oriented to person, place, and time. GCS eye subscore is 4. GCS verbal subscore is 5. GCS motor subscore is 6.  Skin: Skin is warm and dry. No rash noted. She is not diaphoretic. No erythema.  Nursing note and vitals reviewed.    ED Treatments / Results  Labs (all labs ordered are listed, but only abnormal results are displayed) Labs Reviewed  CBC WITH DIFFERENTIAL/PLATELET - Abnormal; Notable for the following:       Result Value   Hemoglobin 11.7 (*)    MCH  25.6 (*)    All other components within normal limits  BASIC METABOLIC PANEL - Abnormal; Notable for the following:    CO2 19 (*)    Glucose, Bld 118 (*)    All other components within normal limits  I-STAT TROPONIN, ED    EKG  EKG Interpretation  Date/Time:  Monday April 10 2017 13:24:49 EDT Ventricular Rate:  103 PR Interval:    QRS Duration: 94 QT Interval:  356 QTC Calculation: 466 R Axis:   56 Text Interpretation:  Sinus tachycardia Baseline wander in lead(s) V3 No significant change since last tracing Confirmed by Alvira Monday ([id number]) on 04/10/2017 4:21:34 PM       Radiology Dg Chest Portable 1 View  Result Date: 04/10/2017 CLINICAL DATA:  Shortness of breath, difficulty breathing, history hypertension, asthma, COPD, former smoker EXAM: PORTABLE CHEST 1 VIEW COMPARISON:  Portable exam 1345 hours compared to 12/22/2015 FINDINGS: Normal heart size, mediastinal contours, and pulmonary vascularity. Low lung volumes. No pulmonary infiltrate, pleural effusion or pneumothorax. Bones unremarkable. IMPRESSION: Low lung volumes without acute infiltrate. Electronically Signed   By: Ulyses Southward M.D.   On: 04/10/2017 14:00    Procedures Procedures (including critical care time)  Medications Ordered in ED Medications  LORazepam (ATIVAN) injection 0.5 mg (0.5 mg Intravenous Given 04/10/17 1331)  magnesium sulfate IVPB 2 g 50 mL (0 g Intravenous Stopped 04/10/17 1416)  albuterol (PROVENTIL,VENTOLIN) solution continuous neb (15 mg/hr Nebulization Given 04/10/17 1349)  ipratropium (ATROVENT) nebulizer solution 1 mg (1 mg Nebulization Given 04/10/17 1349)  methocarbamol (ROBAXIN) 1,000 mg in dextrose 5 % 50 mL IVPB (0 mg Intravenous Stopped 04/10/17 1505)  albuterol (PROVENTIL HFA;VENTOLIN HFA) 108 (90 Base) MCG/ACT inhaler 2 puff (2 puffs Inhalation Given 04/10/17 1725)     Initial Impression / Assessment and Plan / ED Course  I have reviewed the triage vital signs and the nursing  notes.  Pertinent labs & imaging results that were available during my care of the patient were reviewed by me and considered in my medical decision making (see chart for details).    44 old female with a history of COPD, asthma, anxiety, chronic pain syndrome, clinical vocal cord dysfunction presents with concern for shortness of breath. On arrival, the patient and significant respiratory  distress, with noted wheezing, stridor, nonverbal. She received Solu-Medrol albuterol and Atrovent with EMS, and on arrival to the emergency department was placed on BiPAP, given magnesium, continuous albuterol and Atrovent. She reports severe anxiety with BiPAP, and was given 0.5 mg of Ativan IV.   Shortly after initiation of BiPAP, patient was noted to have improved respirations with providers out of the room.  She was taken off of BiPap, was eating, tolerating po.   Chest x-ray shows no sign of pneumonia.  Troponin is negative. EKG without acute findings. Regarding the rectal bleeding patient described, feel this is most likely internal hemorrhoids, low suspicion for clinically significant bleed given stable hemoglobin and description of symptoms.    Patient with likely COPD exacerbation with panic attack. She has a history of multiple similar events. Her respiratory distress improved shortly after arrival. She was given robaxin for pain, likely msk in etiology.  Feels improved. Placed consult to Case Management and Burna Mortimer obtained PCP follow up for patient on Wednesday. Given steroids, robaxin rx. Patient discharged in stable condition with understanding of reasons to return.   Final Clinical Impressions(s) / ED Diagnoses   Final diagnoses:  COPD exacerbation (HCC)    New Prescriptions Discharge Medication List as of 04/10/2017  4:30 PM    START taking these medications   Details  predniSONE (DELTASONE) 10 MG tablet Take 4 tablets (40 mg total) by mouth daily., Starting Mon 04/10/2017, Until Fri 04/14/2017,  Print         Alvira Monday, MD 04/10/17 [phone]

## 2017-04-10 NOTE — ED Notes (Signed)
pts sister updated via pt cell phone per pt request

## 2017-04-10 NOTE — ED Triage Notes (Signed)
Pt BIB [facility] from home where pt was seen by landlord having SOB and decreased ability to talk. Landlord reports pt had episode of weakness and that is when they called 911. 5 albuterol given PTA with no relief. EMS then gave 5 additional albuterol, .5 Atrovent, and 125 solumedrol. Hx Asthma and COPD. Pt reports fever, CP, and diaphoresis.

## 2017-04-12 ENCOUNTER — Inpatient Hospital Stay (INDEPENDENT_AMBULATORY_CARE_PROVIDER_SITE_OTHER): Payer: Self-pay | Admitting: Physician Assistant

## 2017-04-19 ENCOUNTER — Inpatient Hospital Stay: Payer: Self-pay | Admitting: Critical Care Medicine

## 2017-05-29 DIAGNOSIS — M5441 Lumbago with sciatica, right side: Secondary | ICD-10-CM | POA: Diagnosis not present

## 2017-05-29 DIAGNOSIS — M5442 Lumbago with sciatica, left side: Secondary | ICD-10-CM | POA: Diagnosis not present

## 2017-06-02 DIAGNOSIS — M5442 Lumbago with sciatica, left side: Secondary | ICD-10-CM | POA: Diagnosis not present

## 2017-06-02 DIAGNOSIS — M47817 Spondylosis without myelopathy or radiculopathy, lumbosacral region: Secondary | ICD-10-CM | POA: Diagnosis not present

## 2017-06-05 DIAGNOSIS — M5441 Lumbago with sciatica, right side: Secondary | ICD-10-CM | POA: Diagnosis not present

## 2017-06-05 DIAGNOSIS — M7918 Myalgia, other site: Secondary | ICD-10-CM | POA: Diagnosis not present

## 2017-06-05 DIAGNOSIS — M5442 Lumbago with sciatica, left side: Secondary | ICD-10-CM | POA: Diagnosis not present

## 2017-06-05 DIAGNOSIS — Z5181 Encounter for therapeutic drug level monitoring: Secondary | ICD-10-CM | POA: Diagnosis not present

## 2017-06-05 DIAGNOSIS — G8929 Other chronic pain: Secondary | ICD-10-CM | POA: Diagnosis not present

## 2017-06-05 DIAGNOSIS — M533 Sacrococcygeal disorders, not elsewhere classified: Secondary | ICD-10-CM | POA: Diagnosis not present

## 2017-06-05 DIAGNOSIS — G894 Chronic pain syndrome: Secondary | ICD-10-CM | POA: Diagnosis not present

## 2017-06-06 DIAGNOSIS — R252 Cramp and spasm: Secondary | ICD-10-CM | POA: Diagnosis not present

## 2017-06-12 DIAGNOSIS — Z981 Arthrodesis status: Secondary | ICD-10-CM | POA: Diagnosis not present

## 2017-06-20 ENCOUNTER — Inpatient Hospital Stay (HOSPITAL_COMMUNITY)
Admission: EM | Admit: 2017-06-20 | Discharge: 2017-06-22 | DRG: 189 | Disposition: A | Discharge status: Home Health | Payer: Medicare Other | Attending: Internal Medicine | Admitting: Internal Medicine

## 2017-06-20 ENCOUNTER — Encounter (HOSPITAL_COMMUNITY): Payer: Self-pay

## 2017-06-20 ENCOUNTER — Emergency Department (HOSPITAL_COMMUNITY): Payer: Medicare Other

## 2017-06-20 ENCOUNTER — Other Ambulatory Visit: Payer: Self-pay

## 2017-06-20 DIAGNOSIS — I10 Essential (primary) hypertension: Secondary | ICD-10-CM | POA: Diagnosis present

## 2017-06-20 DIAGNOSIS — F1120 Opioid dependence, uncomplicated: Secondary | ICD-10-CM | POA: Diagnosis not present

## 2017-06-20 DIAGNOSIS — Z823 Family history of stroke: Secondary | ICD-10-CM

## 2017-06-20 DIAGNOSIS — Z87891 Personal history of nicotine dependence: Secondary | ICD-10-CM

## 2017-06-20 DIAGNOSIS — F329 Major depressive disorder, single episode, unspecified: Secondary | ICD-10-CM | POA: Diagnosis present

## 2017-06-20 DIAGNOSIS — E669 Obesity, unspecified: Secondary | ICD-10-CM | POA: Diagnosis present

## 2017-06-20 DIAGNOSIS — H9201 Otalgia, right ear: Secondary | ICD-10-CM | POA: Diagnosis not present

## 2017-06-20 DIAGNOSIS — J9601 Acute respiratory failure with hypoxia: Principal | ICD-10-CM | POA: Diagnosis present

## 2017-06-20 DIAGNOSIS — Z981 Arthrodesis status: Secondary | ICD-10-CM

## 2017-06-20 DIAGNOSIS — J45909 Unspecified asthma, uncomplicated: Secondary | ICD-10-CM | POA: Diagnosis present

## 2017-06-20 DIAGNOSIS — R0600 Dyspnea, unspecified: Secondary | ICD-10-CM | POA: Diagnosis not present

## 2017-06-20 DIAGNOSIS — E871 Hypo-osmolality and hyponatremia: Secondary | ICD-10-CM | POA: Diagnosis present

## 2017-06-20 DIAGNOSIS — R058 Other specified cough: Secondary | ICD-10-CM | POA: Diagnosis present

## 2017-06-20 DIAGNOSIS — F411 Generalized anxiety disorder: Secondary | ICD-10-CM | POA: Diagnosis present

## 2017-06-20 DIAGNOSIS — Z930 Tracheostomy status: Secondary | ICD-10-CM

## 2017-06-20 DIAGNOSIS — R739 Hyperglycemia, unspecified: Secondary | ICD-10-CM | POA: Diagnosis present

## 2017-06-20 DIAGNOSIS — R05 Cough: Secondary | ICD-10-CM | POA: Diagnosis present

## 2017-06-20 DIAGNOSIS — E876 Hypokalemia: Secondary | ICD-10-CM | POA: Diagnosis present

## 2017-06-20 DIAGNOSIS — J383 Other diseases of vocal cords: Secondary | ICD-10-CM | POA: Diagnosis not present

## 2017-06-20 DIAGNOSIS — D72829 Elevated white blood cell count, unspecified: Secondary | ICD-10-CM

## 2017-06-20 DIAGNOSIS — R062 Wheezing: Secondary | ICD-10-CM

## 2017-06-20 DIAGNOSIS — J449 Chronic obstructive pulmonary disease, unspecified: Secondary | ICD-10-CM | POA: Diagnosis present

## 2017-06-20 DIAGNOSIS — Z6834 Body mass index (BMI) 34.0-34.9, adult: Secondary | ICD-10-CM

## 2017-06-20 DIAGNOSIS — F410 Panic disorder [episodic paroxysmal anxiety] without agoraphobia: Secondary | ICD-10-CM | POA: Diagnosis present

## 2017-06-20 DIAGNOSIS — R0602 Shortness of breath: Secondary | ICD-10-CM | POA: Diagnosis not present

## 2017-06-20 DIAGNOSIS — F419 Anxiety disorder, unspecified: Secondary | ICD-10-CM

## 2017-06-20 DIAGNOSIS — G8929 Other chronic pain: Secondary | ICD-10-CM | POA: Diagnosis present

## 2017-06-20 DIAGNOSIS — J069 Acute upper respiratory infection, unspecified: Secondary | ICD-10-CM | POA: Diagnosis present

## 2017-06-20 DIAGNOSIS — Z7951 Long term (current) use of inhaled steroids: Secondary | ICD-10-CM

## 2017-06-20 DIAGNOSIS — Z8249 Family history of ischemic heart disease and other diseases of the circulatory system: Secondary | ICD-10-CM

## 2017-06-20 DIAGNOSIS — Z79899 Other long term (current) drug therapy: Secondary | ICD-10-CM

## 2017-06-20 DIAGNOSIS — K219 Gastro-esophageal reflux disease without esophagitis: Secondary | ICD-10-CM | POA: Diagnosis present

## 2017-06-20 LAB — CBC WITH DIFFERENTIAL/PLATELET
BASOS ABS: 0 10*3/uL (ref 0.0–0.1)
BASOS PCT: 0 %
Eosinophils Absolute: 0.1 10*3/uL (ref 0.0–0.7)
Eosinophils Relative: 1 %
HCT: 35.4 % — ABNORMAL LOW (ref 36.0–46.0)
HEMOGLOBIN: 11.1 g/dL — AB (ref 12.0–15.0)
LYMPHS ABS: 1.8 10*3/uL (ref 0.7–4.0)
LYMPHS PCT: 23 %
MCH: 25 pg — ABNORMAL LOW (ref 26.0–34.0)
MCHC: 31.4 g/dL (ref 30.0–36.0)
MCV: 79.7 fL (ref 78.0–100.0)
Monocytes Absolute: 0.4 10*3/uL (ref 0.1–1.0)
Monocytes Relative: 5 %
NEUTROS ABS: 5.7 10*3/uL (ref 1.7–7.7)
NEUTROS PCT: 71 %
Platelets: 310 10*3/uL (ref 150–400)
RBC: 4.44 MIL/uL (ref 3.87–5.11)
RDW: 14.5 % (ref 11.5–15.5)
WBC: 8.1 10*3/uL (ref 4.0–10.5)

## 2017-06-20 LAB — BASIC METABOLIC PANEL
ANION GAP: 6 (ref 5–15)
BUN: 8 mg/dL (ref 6–20)
CALCIUM: 9.1 mg/dL (ref 8.9–10.3)
CHLORIDE: 105 mmol/L (ref 101–111)
CO2: 26 mmol/L (ref 22–32)
Creatinine, Ser: 0.8 mg/dL (ref 0.44–1.00)
GFR calc non Af Amer: >60 mL/min (ref >60)
GLUCOSE: 103 mg/dL — AB (ref 65–99)
POTASSIUM: 3.4 mmol/L — AB (ref 3.5–5.1)
Sodium: 137 mmol/L (ref 135–145)

## 2017-06-20 LAB — INFLUENZA PANEL BY PCR (TYPE A & B)
Influenza A By PCR: NEGATIVE
Influenza B By PCR: NEGATIVE

## 2017-06-20 LAB — I-STAT BETA HCG BLOOD, ED (MC, WL, AP ONLY): I-stat hCG, quantitative: <5 m[IU]/mL (ref <5)

## 2017-06-20 LAB — MAGNESIUM: MAGNESIUM: 2.1 mg/dL (ref 1.7–2.4)

## 2017-06-20 MED ORDER — MORPHINE SULFATE (PF) 4 MG/ML IV SOLN
1.0000 mg | Freq: Once | INTRAVENOUS | Status: AC
  Administered 2017-06-20: 1 mg via INTRAVENOUS
  Filled 2017-06-20: qty 1

## 2017-06-20 MED ORDER — ACETAMINOPHEN 650 MG RE SUPP: 650.0000 mg | Freq: Four times a day (QID) | RECTAL | Status: DC | PRN

## 2017-06-20 MED ORDER — METHOCARBAMOL 500 MG PO TABS
1000.0000 mg | ORAL_TABLET | Freq: Two times a day (BID) | ORAL | Status: DC
  Administered 2017-06-20 – 2017-06-22 (×4): 1000 mg via ORAL
  Filled 2017-06-20 (×5): qty 2

## 2017-06-20 MED ORDER — LORAZEPAM 2 MG/ML IJ SOLN
1.0000 mg | Freq: Once | INTRAMUSCULAR | Status: AC
  Administered 2017-06-20: 1 mg via INTRAVENOUS
  Filled 2017-06-20: qty 1

## 2017-06-20 MED ORDER — MAGNESIUM SULFATE 2 GM/50ML IV SOLN
2.0000 g | Freq: Once | INTRAVENOUS | Status: AC
  Administered 2017-06-20: 2 g via INTRAVENOUS
  Filled 2017-06-20: qty 50

## 2017-06-20 MED ORDER — PANTOPRAZOLE SODIUM 40 MG PO TBEC
40.0000 mg | DELAYED_RELEASE_TABLET | Freq: Two times a day (BID) | ORAL | Status: DC
  Administered 2017-06-20 – 2017-06-22 (×4): 40 mg via ORAL
  Filled 2017-06-20 (×4): qty 1

## 2017-06-20 MED ORDER — OXYMETAZOLINE HCL 0.05 % NA SOLN
1.0000 | Freq: Two times a day (BID) | NASAL | Status: DC
  Administered 2017-06-20 – 2017-06-22 (×4): 1 via NASAL
  Filled 2017-06-20: qty 15

## 2017-06-20 MED ORDER — METHYLPREDNISOLONE SODIUM SUCC 125 MG IJ SOLR
125.0000 mg | Freq: Once | INTRAMUSCULAR | Status: AC
  Administered 2017-06-20: 125 mg via INTRAVENOUS
  Filled 2017-06-20: qty 2

## 2017-06-20 MED ORDER — SODIUM CHLORIDE 0.9% FLUSH
3.0000 mL | Freq: Two times a day (BID) | INTRAVENOUS | Status: DC
  Administered 2017-06-22: 3 mL via INTRAVENOUS

## 2017-06-20 MED ORDER — IPRATROPIUM BROMIDE 0.02 % IN SOLN
0.5000 mg | Freq: Once | RESPIRATORY_TRACT | Status: AC
  Administered 2017-06-20: 0.5 mg via RESPIRATORY_TRACT
  Filled 2017-06-20: qty 2.5

## 2017-06-20 MED ORDER — ENOXAPARIN SODIUM 40 MG/0.4ML ~~LOC~~ SOLN
40.0000 mg | SUBCUTANEOUS | Status: DC
  Administered 2017-06-20 – 2017-06-21 (×2): 40 mg via SUBCUTANEOUS
  Filled 2017-06-20 (×3): qty 0.4

## 2017-06-20 MED ORDER — ALBUTEROL (5 MG/ML) CONTINUOUS INHALATION SOLN
10.0000 mg/h | INHALATION_SOLUTION | RESPIRATORY_TRACT | Status: AC
  Administered 2017-06-20: 10 mg/h via RESPIRATORY_TRACT
  Filled 2017-06-20 (×2): qty 20

## 2017-06-20 MED ORDER — PANTOPRAZOLE SODIUM 40 MG PO TBEC: 40.0000 mg | DELAYED_RELEASE_TABLET | Freq: Every day | ORAL | Status: DC

## 2017-06-20 MED ORDER — ZOLPIDEM TARTRATE 5 MG PO TABS
5.0000 mg | ORAL_TABLET | Freq: Every day | ORAL | Status: DC
  Administered 2017-06-20 – 2017-06-21 (×2): 5 mg via ORAL
  Filled 2017-06-20 (×2): qty 1

## 2017-06-20 MED ORDER — BUDESONIDE 0.25 MG/2ML IN SUSP
0.2500 mg | Freq: Two times a day (BID) | RESPIRATORY_TRACT | Status: DC
  Administered 2017-06-20 – 2017-06-22 (×4): 0.25 mg via RESPIRATORY_TRACT
  Filled 2017-06-20 (×6): qty 2

## 2017-06-20 MED ORDER — VERAPAMIL HCL ER 120 MG PO TBCR
120.0000 mg | EXTENDED_RELEASE_TABLET | Freq: Every day | ORAL | Status: DC
  Administered 2017-06-20 – 2017-06-21 (×2): 120 mg via ORAL
  Filled 2017-06-20 (×3): qty 1

## 2017-06-20 MED ORDER — ALBUTEROL SULFATE (2.5 MG/3ML) 0.083% IN NEBU
INHALATION_SOLUTION | RESPIRATORY_TRACT | Status: AC
  Administered 2017-06-20: 2.5 mg via RESPIRATORY_TRACT
  Filled 2017-06-20: qty 3

## 2017-06-20 MED ORDER — HYDRALAZINE HCL 20 MG/ML IJ SOLN: 5.0000 mg | INTRAMUSCULAR | Status: DC | PRN

## 2017-06-20 MED ORDER — OXYCODONE HCL 5 MG PO TABS
5.0000 mg | ORAL_TABLET | Freq: Four times a day (QID) | ORAL | Status: DC | PRN
  Administered 2017-06-20 – 2017-06-22 (×7): 5 mg via ORAL
  Filled 2017-06-20 (×7): qty 1

## 2017-06-20 MED ORDER — PROMETHAZINE HCL 25 MG PO TABS: 12.5000 mg | ORAL_TABLET | Freq: Four times a day (QID) | ORAL | Status: DC | PRN

## 2017-06-20 MED ORDER — MORPHINE SULFATE (PF) 4 MG/ML IV SOLN: 4.0000 mg | Freq: Once | INTRAVENOUS | Status: DC

## 2017-06-20 MED ORDER — VERAPAMIL HCL ER 100 MG PO CP24: 100.0000 mg | ORAL_CAPSULE | Freq: Every day | ORAL | Status: DC

## 2017-06-20 MED ORDER — MELOXICAM 7.5 MG PO TABS
7.5000 mg | ORAL_TABLET | Freq: Every day | ORAL | Status: DC
  Administered 2017-06-20 – 2017-06-22 (×3): 7.5 mg via ORAL
  Filled 2017-06-20 (×3): qty 1

## 2017-06-20 MED ORDER — FLUTICASONE PROPIONATE 50 MCG/ACT NA SUSP
2.0000 | Freq: Two times a day (BID) | NASAL | Status: DC
  Administered 2017-06-20 – 2017-06-22 (×4): 2 via NASAL
  Filled 2017-06-20: qty 16

## 2017-06-20 MED ORDER — IPRATROPIUM-ALBUTEROL 0.5-2.5 (3) MG/3ML IN SOLN
3.0000 mL | Freq: Four times a day (QID) | RESPIRATORY_TRACT | Status: DC
  Administered 2017-06-20: 3 mL via RESPIRATORY_TRACT
  Filled 2017-06-20: qty 3

## 2017-06-20 MED ORDER — DOXYCYCLINE HYCLATE 100 MG IV SOLR
100.0000 mg | Freq: Once | INTRAVENOUS | Status: AC
  Administered 2017-06-20: 100 mg via INTRAVENOUS
  Filled 2017-06-20: qty 100

## 2017-06-20 MED ORDER — BENZONATATE 100 MG PO CAPS
200.0000 mg | ORAL_CAPSULE | Freq: Three times a day (TID) | ORAL | Status: DC
  Administered 2017-06-20 – 2017-06-22 (×6): 200 mg via ORAL
  Filled 2017-06-20 (×6): qty 2

## 2017-06-20 MED ORDER — AMOXICILLIN 500 MG PO CAPS
1000.0000 mg | ORAL_CAPSULE | Freq: Once | ORAL | Status: AC
  Administered 2017-06-20: 1000 mg via ORAL
  Filled 2017-06-20: qty 2

## 2017-06-20 MED ORDER — VERAPAMIL HCL ER 100 MG PO CP24
100.0000 mg | ORAL_CAPSULE | Freq: Every day | ORAL | Status: DC
  Filled 2017-06-20: qty 1

## 2017-06-20 MED ORDER — ALBUTEROL SULFATE (2.5 MG/3ML) 0.083% IN NEBU
5.0000 mg | INHALATION_SOLUTION | Freq: Once | RESPIRATORY_TRACT | Status: AC
  Administered 2017-06-20: 2.5 mg via RESPIRATORY_TRACT

## 2017-06-20 MED ORDER — ACETAMINOPHEN 325 MG PO TABS
650.0000 mg | ORAL_TABLET | Freq: Four times a day (QID) | ORAL | Status: DC | PRN
  Administered 2017-06-20 – 2017-06-22 (×6): 650 mg via ORAL
  Filled 2017-06-20 (×7): qty 2

## 2017-06-20 MED ORDER — METHYLPREDNISOLONE SODIUM SUCC 125 MG IJ SOLR
60.0000 mg | Freq: Four times a day (QID) | INTRAMUSCULAR | Status: DC
  Administered 2017-06-20 – 2017-06-22 (×7): 60 mg via INTRAVENOUS
  Filled 2017-06-20 (×8): qty 2

## 2017-06-20 MED ORDER — SODIUM CHLORIDE 0.9 % IV SOLN
INTRAVENOUS | Status: DC
  Administered 2017-06-20 – 2017-06-22 (×4): via INTRAVENOUS

## 2017-06-20 NOTE — H&P (Signed)
History and Physical    Julie Kerr ZOX:[id number] DOB: [birth year] DOA: 06/20/2017   PCP: Patient, No Pcp Per   Attending physician: Konrad Dolores  Patient coming from/Resides with: Private residence  Chief Complaint: Shortness of breath, cough and right ear pain  HPI: Julie Kerr is a 41 y.o. 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  Patient presented to the ER with progressive shortness of breath over 24 hours.  In triage she was documented with audible wheezing, labored respiration and nasal nasal flaring.  It was not documented whether she was hypoxemic on room air or not.  Patient reported to triage nurse she was out of home inhaler.  Chest x-ray unremarkable.  Patient was also complaining of right ear pain with tympanic membrane exam not consistent with otitis media.  Patient reports nonproductive wet sounding cough but has not had fevers or chills or sick contacts.  Labs were normal except for mild hypokalemia and patient did not have any leukocytosis.  Patient has been given multiple albuterol nebulizer treatments, IV magnesium, Solu-Medrol, Ativan and amoxicillin in the ER without improvement in symptoms.  He remains afebrile and is not hypoxemic.  Because of lack of improvement ABG was ordered but patient refused this test unless she was given a dose of narcotic pain medication.  On exam patient was noted with profound upper airway wheezing and forced coughing.  She will be placed in observation status and viral upper respiratory illness syndrome will be ruled out.  She will be given IV steroids, IV fluids, frequent nebulizer treatments and Tessalon Perles to help suppress cough.  She was not taking reflux  medications for her VCD therefore IV PPI will be initiated during this admission with her blood transition to oral dosing at time of discharge.  ED Course:  Vital Signs: BP 127/69   Pulse 97   Temp 98.8 F (37.1 C) (Oral)   Resp (!) 21   Ht 5\' 6"  (1.676 m)   Wt 97.5 kg (215 lb)   LMP 06/13/2017   SpO2 100%   BMI 34.70 kg/m  2 view chest x-ray: No active cardiopulmonary disease Lab data: Sodium 137, potassium 3.4, chloride 105, CO2 26, glucose 103, BUN 8, creatinine 0.8, white count 8100 with normal differential, hemoglobin 11.1, platelets 310,000, i-STAT hCG quantitative <5.0 Medications and treatments: Albuterol neb 2.5 mg x1, Solu-Medrol 125 mg x1, magnesium 2 g IV x1, Ativan 1 mg IV x1, continuous albuterol neb 10 mg/HR x1, Atrovent neb 0.5 mg x1, amoxicillin 1000 mg x1  Review of Systems:  In addition to the HPI above,  No Fever-chills, myalgias or other constitutional symptoms No Headache, changes with Vision or hearing, new weakness, tingling, numbness in any extremity, dizziness, dysarthria or word finding difficulty, gait disturbance or imbalance, tremors or seizure activity No problems swallowing food or Liquids, indigestion/reflux, choking or coughing while eating, abdominal pain with or after eating No Chest pain, palpitations, orthopnea or DOE No Abdominal pain, N/V, melena,hematochezia, dark tarry stools, constipation No dysuria, malodorous urine, hematuria or flank pain No new skin rashes, lesions, masses or bruises, No new joint pains, aches, swelling or redness No recent unintentional weight gain or loss No polyuria, polydypsia or polyphagia   Past Medical History:  Diagnosis Date  . Anxiety   .  Asthma   . Bronchitis   . Chronic narcotic dependence (HCC)   . Heart murmur   . Hypertension   . Shortness of breath   . Vocal cord dysfunction     Past Surgical History:  Procedure Laterality Date  . SPINAL FUSION  11/2012   [facility] by Dr.  Mayford KnifeWilliams  . TRACHEOSTOMY  December 2015    Social History   Socioeconomic History  . Marital status: Single    Spouse name: Not on file  . Number of children: Not on file  . Years of education: Not on file  . Highest education level: Not on file  Social Needs  . Financial resource strain: Not on file  . Food insecurity - worry: Not on file  . Food insecurity - inability: Not on file  . Transportation needs - medical: Not on file  . Transportation needs - non-medical: Not on file  Occupational History  . Not on file  Tobacco Use  . Smoking status: Former Smoker    Packs/day: 0.00    Years: 17.00    Pack years: 0.00    Types: Cigarettes    Last attempt to quit: 06/15/2014    Years since quitting: 3.0  . Smokeless tobacco: Never Used  Substance and Sexual Activity  . Alcohol use: Yes    Alcohol/week: 0.0 oz    Comment: occasional  . Drug use: No  . Sexual activity: Yes    Birth control/protection: Other-see comments    Comment: female partners only  Other Topics Concern  . Not on file  Social History Narrative  . Not on file    Mobility: Independent Work history: Disabled   Allergies  Allergen Reactions  . Robitussin Dm [Dextromethorphan-Guaifenesin] Nausea And Vomiting  . Chocolate Hives  . Suboxone [Buprenorphine Hcl-Naloxone Hcl] Other (See Comments)    Aggressive behavior  . Nsaids Hives  . Rayon, Purified Hives  . Tolmetin Hives  . Tramadol Hives and Itching    Family History  Problem Relation Age of Onset  . HIV Mother   . Heart disease Father   . CVA Father   . Heart disease Other   . Emphysema Maternal Grandmother        smoked  . Clotting disorder Maternal Grandmother   . Lung cancer Maternal Grandmother        smoked  . Asthma Sister   . Clotting disorder Sister      Prior to Admission medications   Medication Sig Start Date End Date Taking? Authorizing Provider  albuterol (PROVENTIL HFA;VENTOLIN HFA) 108 (90 Base) MCG/ACT inhaler  Inhale 2 puffs into the lungs every 6 (six) hours as needed for wheezing or shortness of breath. 10/25/16  Yes Jaclyn ShaggyAmao, Enobong, MD  ALPRAZolam Prudy Feeler(XANAX) 0.5 MG tablet Take 1 tablet (0.5 mg total) by mouth 3 (three) times daily as needed for anxiety. Patient taking differently: Take 1 mg by mouth 3 (three) times daily as needed for anxiety.  02/03/16  Yes Storm FriskWright, Patrick E, MD  budesonide-formoterol (SYMBICORT) 80-4.5 MCG/ACT inhaler Inhale 2 puffs into the lungs 2 (two) times daily. 10/25/16  Yes Jaclyn ShaggyAmao, Enobong, MD  fluticasone (FLONASE) 50 MCG/ACT nasal spray Place 2 sprays into both nostrils 2 (two) times daily. 02/03/16  Yes Storm FriskWright, Patrick E, MD  fluticasone furoate-vilanterol (BREO ELLIPTA) 200-25 MCG/INH AEPB Inhale 1 puff into the lungs daily.   Yes [provider]  Fluticasone-Salmeterol (ADVAIR DISKUS) 100-50 MCG/DOSE AEPB Inhale 1 puff into the lungs 2 (two)  times daily.   Yes [provider]  gabapentin (NEURONTIN) 300 MG capsule Take 2 capsules (600 mg total) by mouth 2 (two) times daily. Patient taking differently: Take 400 mg by mouth 3 (three) times daily.  02/03/16  Yes Storm Frisk, MD  ipratropium-albuterol (DUONEB) 0.5-2.5 (3) MG/3ML SOLN Take 3 mLs by nebulization every 4 (four) hours as needed (shortness of breath/wheezing). 10/25/16  Yes Jaclyn Shaggy, MD  meloxicam (MOBIC) 7.5 MG tablet Take 7.5 mg daily by mouth. 06/06/17  Yes [provider]  methocarbamol (ROBAXIN) 500 MG tablet Take 2 tablets (1,000 mg total) by mouth 2 (two) times daily. 04/10/17  Yes Alvira Monday, MD  morphine (MSIR) 30 MG tablet Take 30 mg by mouth every 12 (twelve) hours.   Yes [provider]  oxyCODONE (ROXICODONE) 15 MG immediate release tablet Take 15 mg by mouth every 4 (four) hours as needed for pain.   Yes [provider]  pantoprazole (PROTONIX) 40 MG tablet Take 1 tablet (40 mg total) by mouth daily at 12 noon. 02/03/16  Yes Storm Frisk, MD    verapamil (VERELAN) 100 MG 24 hr capsule Take 100 mg by mouth at bedtime.   Yes [provider]  zolpidem (AMBIEN) 10 MG tablet Take 10 mg by mouth at bedtime. Reported on 12/03/2015 04/30/15  Yes [provider]  alteplase (ACTIVASE) 1 mg/mL injection 2 mg by Intracatheter route once as needed.    [provider]    Physical Exam: Vitals:   06/20/17 1300 06/20/17 1445 06/20/17 1515 06/20/17 1530  BP: 133/86  110/79 127/69  Pulse: 99 88 77 97  Resp: (!) 21 (!) 22 (!) 21 (!) 21  Temp:      TempSrc:      SpO2: 100% 99% 100% 100%  Weight:      Height:          Constitutional: NAD, restless, uncomfortable secondary to reports of right ear and jaw pain Eyes: PERRL, lids and conjunctivae normal ENMT: Mucous membranes are moist. Posterior pharynx clear of any exudate or lesions.  Neck: normal, supple, no masses, no thyromegaly Respiratory: clear to auscultation bilaterally, no respiratory field wheezing, no crackles.  Patient does have upper airway (throat) wheezing and forced respiratory effort.  Upper airway wheezing worse after paroxysmal coughing episode.  100% saturation on room air-patient lying supine in bed. Cardiovascular: Regular rate and rhythm, no murmurs / rubs / gallops. No extremity edema. 2+ pedal pulses. No carotid bruits.  Abdomen: no tenderness, no masses palpated. No hepatosplenomegaly. Bowel sounds positive.  Musculoskeletal: no clubbing / cyanosis. No joint deformity upper and lower extremities. Good ROM, no contractures. Normal muscle tone.  Skin: no rashes, lesions, ulcers. No induration Neurologic: CN 2-12 grossly intact. Sensation intact, DTR normal. Strength 5/5 x all 4 extremities.  Psychiatric: Normal judgment and insight. Alert and oriented x 3.  Restless mood.    Labs on Admission: I have personally reviewed following labs and imaging studies  CBC: Recent Labs  Lab 06/20/17 1455  WBC 8.1  NEUTROABS 5.7  HGB 11.1*  HCT 35.4*   MCV 79.7  PLT 310   Basic Metabolic Panel: Recent Labs  Lab 06/20/17 1455  NA 137  K 3.4*  CL 105  CO2 26  GLUCOSE 103*  BUN 8  CREATININE 0.80  CALCIUM 9.1   GFR: Estimated Creatinine Clearance: 111.2 mL/min (by C-G formula based on SCr of 0.8 mg/dL). Liver Function Tests: No results for input(s): AST, ALT,  ALKPHOS, BILITOT, PROT, ALBUMIN in the last 168 hours. No results for input(s): LIPASE, AMYLASE in the last 168 hours. No results for input(s): AMMONIA in the last 168 hours. Coagulation Profile: No results for input(s): INR, PROTIME in the last 168 hours. Cardiac Enzymes: No results for input(s): CKTOTAL, CKMB, CKMBINDEX, TROPONINI in the last 168 hours. BNP (last 3 results) No results for input(s): PROBNP in the last 8760 hours. HbA1C: No results for input(s): HGBA1C in the last 72 hours. CBG: No results for input(s): GLUCAP in the last 168 hours. Lipid Profile: No results for input(s): CHOL, HDL, LDLCALC, TRIG, CHOLHDL, LDLDIRECT in the last 72 hours. Thyroid Function Tests: No results for input(s): TSH, T4TOTAL, FREET4, T3FREE, THYROIDAB in the last 72 hours. Anemia Panel: No results for input(s): VITAMINB12, FOLATE, FERRITIN, TIBC, IRON, RETICCTPCT in the last 72 hours. Urine analysis:    Component Value Date/Time   COLORURINE YELLOW 08/28/2015 1714   APPEARANCEUR CLEAR 08/28/2015 1714   LABSPEC 1.019 08/28/2015 1714   PHURINE 6.5 08/28/2015 1714   GLUCOSEU >1000 (A) 08/28/2015 1714   HGBUR NEGATIVE 08/28/2015 1714   BILIRUBINUR NEGATIVE 08/28/2015 1714   KETONESUR 15 (A) 08/28/2015 1714   PROTEINUR NEGATIVE 08/28/2015 1714   UROBILINOGEN 0.2 03/25/2015 1526   NITRITE NEGATIVE 08/28/2015 1714   LEUKOCYTESUR NEGATIVE 08/28/2015 1714   Sepsis Labs: @LABRCNTIP (procalcitonin:4,lacticidven:4) )No results found for this or any previous visit (from the past 240 hour(s)).   Radiological Exams on Admission: Dg Chest 2 View  Result Date:  06/20/2017 CLINICAL DATA:  Shortness of breath and wheezing for 2 days EXAM: CHEST  2 VIEW COMPARISON:  04/10/2017 FINDINGS: Cardiac shadow is stable at the upper limits of normal in size. The lungs are well aerated bilaterally. No focal infiltrate or sizable effusion is seen. No acute bony abnormality is noted. IMPRESSION: No active cardiopulmonary disease. Electronically Signed   By: Alcide CleverMark  Lukens M.D.   On: 06/20/2017 13:21    EKG: (Independently reviewed) sinus rhythm with ventricular rate 96 bpm, QTC 444 ms, normal R wave rotation, changes consistent with early repolarization in lead V1 and V2, no acute ischemic changes  Assessment/Plan Principal Problem:   Acute dyspnea in context of chronic Vocal cord dysfunction/upper airway cough syndrome -Patient presents with acute dyspnea without hypoxemia, normal chest x-ray, and clinical exam consistent with upper airway wheezing -It is possibility that chronic cough syndrome has been exacerbated by an acute viral syndrome so we will check influenza PCR (did not receive flu shot this season) and obtain respiratory viral panel -IV Solu-Medrol 60 mg IV every 6 hours with likely transition to slow steroid taper at time of discharge -We will also utilize budesonide nebs as directed steroid to vocal cords and lungs -DuoNeb scheduled every 6 hours -IS -Tessalon Perles to assist with cough symptoms i.e. paroxysmal coughing -Protonix 40 mg every 12 hours noting patient will need prescription for PPI at time of discharge -Oxygen as needed for oximetry less than or equal to 92% -No indication at this juncture to initiate antibiotic -Normal saline IV fluids for insensible fluid losses  **She needs to reestablish with the [facility] since she is primarily residing in [place] now per her report  Active Problems:   Chronic narcotic dependence/history of polysubstance abuse/lysed anxiety disorder -Patient reports has chronic low  back and hip pain; self[place] her oxycodone and Percocet but it is unclear if she has an active prescription available at home -  When further questioned regarding the pain clinic, patient reports that she had too many visits where she did not show up for an appointment and/or was unable to pay and therefore is no longer active with this pain clinic. -It has been documented during previous encounters that patient has demonstrated drug-seeking behaviors and today she refused an ABG unless she was given pain medication -Old medication reconciliation lists prn Xanax as medication as well but this has not been confirmed as being a current active prescription.  During previous admission patient apparently had been on Lyrica and Seroquel but uncertain if she still takes these medications to treat her anxiety disorder and panic attacks. -Since it is unclear as to whether she has active prescription for narcotics and therefore has been taking regularly we will allow for oxycodone 5 mg every 6 hours prn to prevent potential narcotic withdrawal.  We will not prescribe narcotics or benzodiazepines at time of discharge.  The patient was informed of this policy at time of admission. -UDS    Right ear pain -Patient reports significant right ear pain with exam not consistent with acute otitis media -Symptom management -Ultram for moderate pain -Tylenol and/or preadmission Mobic for mild pain -Intranasal Afrin -Continue preadmission Flonase    Hypertension -Continue preadmission verapamil    Obesity (BMI 30.0-34.9)      DVT prophylaxis: Lovenox Code Status: Full Family Communication: Family member at bedside with patient permission Disposition Plan: Home Consults called: None    Horris Speros L. ANP-BC [facility] Pager [phone]   If 7PM-7AM, please contact night-coverage [email or web address] Password [email or web address]  06/20/2017, 4:08 PM

## 2017-06-20 NOTE — Progress Notes (Signed)
Received patient from ED, alert oriented x4. Patient rating pain 10/10.

## 2017-06-20 NOTE — Progress Notes (Signed)
Patient has ABG ordered. Patient refused ABG unless she was given pain meds first. Notified primary RN and NP taking care of patient.

## 2017-06-20 NOTE — ED Provider Notes (Signed)
[facility] Provider Note   CSN: [id number] Arrival date & time: 06/20/17  1235     History   Chief Complaint Chief Complaint  Patient presents with  . Shortness of Breath  . Otalgia     HPI   Blood pressure 127/69, pulse 97, temperature 98.8 F (37.1 C), temperature source Oral, resp. rate (!) 21, height 5\' 6"  (1.676 m), weight 97.5 kg (215 lb), last menstrual period 06/13/2017, SpO2 100 %.  Julie Kerr is a 41 y.o. female  With PMH of COPD, asthma, anxiety, and previous tracheostomy placement secondary to difficulty breathing in the past reports to ED from the community with a chief complaint of shortness of breath and otalgia that began 2 days ago. She states she ran out of her at home asthma medications 2 days ago as well and had increased anxiety and panic prior to the onset of shortness of breath. She also admits to a nonproductive cough for 1 week and chest pain that is "tight" and 10/10 for 2 days. These symptoms are consistent with previous episodes of shortness of breath that she has experienced. She received a nebulizer treatment while in triage with no symptom relief. She also has right otalgia and right head and scalp pain that began 2 days ago as well. The pain is 10/10. She has not experienced these symptoms with previous SHOB episodes. She denies any fever, chills, pharyngitis, N/V/D, dizziness, or hearing loss.  Past Medical History:  Diagnosis Date  . Anxiety   . Asthma   . Bronchitis   . Chronic narcotic dependence (HCC)   . Heart murmur   . Hypertension   . Shortness of breath   . Vocal cord dysfunction     Patient Active Problem List   Diagnosis Date Noted  . Acute dyspnea 06/20/2017  . Chronic narcotic dependence (HCC) 06/20/2017  . Hypertension 06/20/2017  . Vocal cord dysfunction 06/20/2017  . Obesity (BMI 30.0-34.9) 06/20/2017  . Right ear pain 06/20/2017  . GERD (gastroesophageal reflux disease) 02/03/2016   . Obesity 08/24/2015  . Tobacco abuse   . Cocaine abuse (HCC)   . Anxiety and depression   . Substance abuse (HCC)   . Asthma 07/27/2015  . Frequent falls 05/29/2015  . COPD (chronic obstructive pulmonary disease) (HCC) 05/08/2015  . Alcohol abuse   . Depression   . Adjustment disorder with anxious mood   . Hereditary and idiopathic peripheral neuropathy   . Chronic asthmatic bronchitis (HCC) 08/21/2014  . Cough   . Polysubstance abuse (HCC) 07/28/2014  . Generalized anxiety disorder 01/20/2014  . Panic attacks 01/20/2014  . Upper airway cough syndrome, severe, with clinical VCD 12/07/2013  . Stridor 10/29/2013  . Anemia 08/04/2013  . Tobacco use disorder 06/17/2013  . Chronic pain syndrome 06/17/2013    Past Surgical History:  Procedure Laterality Date  . SPINAL FUSION  11/2012   [facility] Mayford KnifeWilliams  . TRACHEOSTOMY  December 2015    OB History    No data available       Home Medications    Prior to Admission medications   Medication Sig Start Date End Date Taking? Authorizing Provider  albuterol (PROVENTIL HFA;VENTOLIN HFA) 108 (90 Base) MCG/ACT inhaler Inhale 2 puffs into the lungs every 6 (six) hours as needed for wheezing or shortness of breath. 10/25/16  Yes Jaclyn ShaggyAmao, Enobong, MD  ALPRAZolam Prudy Feeler(XANAX) 0.5 MG tablet Take 1 tablet (0.5 mg total) by mouth 3 (three) times  daily as needed for anxiety. Patient taking differently: Take 1 mg by mouth 3 (three) times daily as needed for anxiety.  02/03/16  Yes Storm Frisk, MD  budesonide-formoterol (SYMBICORT) 80-4.5 MCG/ACT inhaler Inhale 2 puffs into the lungs 2 (two) times daily. 10/25/16  Yes Jaclyn Shaggy, MD  fluticasone (FLONASE) 50 MCG/ACT nasal spray Place 2 sprays into both nostrils 2 (two) times daily. 02/03/16  Yes Storm Frisk, MD  fluticasone furoate-vilanterol (BREO ELLIPTA) 200-25 MCG/INH AEPB Inhale 1 puff into the lungs daily.   Yes [provider]  Fluticasone-Salmeterol  (ADVAIR DISKUS) 100-50 MCG/DOSE AEPB Inhale 1 puff into the lungs 2 (two) times daily.   Yes [provider]  gabapentin (NEURONTIN) 300 MG capsule Take 2 capsules (600 mg total) by mouth 2 (two) times daily. Patient taking differently: Take 400 mg by mouth 3 (three) times daily.  02/03/16  Yes Storm Frisk, MD  ipratropium-albuterol (DUONEB) 0.5-2.5 (3) MG/3ML SOLN Take 3 mLs by nebulization every 4 (four) hours as needed (shortness of breath/wheezing). 10/25/16  Yes Jaclyn Shaggy, MD  meloxicam (MOBIC) 7.5 MG tablet Take 7.5 mg daily by mouth. 06/06/17  Yes [provider]  methocarbamol (ROBAXIN) 500 MG tablet Take 2 tablets (1,000 mg total) by mouth 2 (two) times daily. 04/10/17  Yes Alvira Monday, MD  morphine (MSIR) 30 MG tablet Take 30 mg by mouth every 12 (twelve) hours.   Yes [provider]  oxyCODONE (ROXICODONE) 15 MG immediate release tablet Take 15 mg by mouth every 4 (four) hours as needed for pain.   Yes [provider]  pantoprazole (PROTONIX) 40 MG tablet Take 1 tablet (40 mg total) by mouth daily at 12 noon. 02/03/16  Yes Storm Frisk, MD  verapamil (VERELAN) 100 MG 24 hr capsule Take 100 mg by mouth at bedtime.   Yes [provider]  zolpidem (AMBIEN) 10 MG tablet Take 10 mg by mouth at bedtime. Reported on 12/03/2015 04/30/15  Yes [provider]  alteplase (ACTIVASE) 1 mg/mL injection 2 mg by Intracatheter route once as needed.    [provider]    Family History Family History  Problem Relation Age of Onset  . HIV Mother   . Heart disease Father   . CVA Father   . Heart disease Other   . Emphysema Maternal Grandmother        smoked  . Clotting disorder Maternal Grandmother   . Lung cancer Maternal Grandmother        smoked  . Asthma Sister   . Clotting disorder Sister     Social History Social History   Tobacco Use  . Smoking status: Former Smoker    Packs/day: 0.00    Years: 17.00     Pack years: 0.00    Types: Cigarettes    Last attempt to quit: 06/15/2014    Years since quitting: 3.0  . Smokeless tobacco: Never Used  Substance Use Topics  . Alcohol use: Yes    Alcohol/week: 0.0 oz    Comment: occasional  . Drug use: No     Allergies   Robitussin dm [dextromethorphan-guaifenesin]; Chocolate; Suboxone [buprenorphine hcl-naloxone hcl]; Nsaids; Rayon, purified; Tolmetin; and Tramadol   Review of Systems Review of Systems  A complete review of systems was obtained and all systems are negative except as noted in the HPI and PMH.    Physical Exam Updated Vital Signs BP 130/77   Pulse 97   Temp 98.8 F (37.1 C) (Oral)  Resp (!) 31   Ht 5\' 6"  (1.676 m)   Wt 97.5 kg (215 lb)   LMP 06/13/2017   SpO2 100%   BMI 34.70 kg/m   Physical Exam  Constitutional: She is oriented to person, place, and time. She appears well-developed and well-nourished. No distress.  HENT:  Head: Normocephalic.  Right Ear: External ear normal.  Left Ear: External ear normal.  Mouth/Throat: Oropharynx is clear and moist. No oropharyngeal exudate.  No drooling or stridor. Posterior pharynx mildly erythematous no significant tonsillar hypertrophy. No exudate. Soft palate rises symmetrically. No TTP or induration under tongue.   Exquisitely TTP of right periorbital region  Mild mucosal edema in the nares with scant rhinorrhea.  Bilateral tympanic membranes with normal architecture and good light reflex.    Eyes: Conjunctivae and EOM are normal. Pupils are equal, round, and reactive to light.  Neck: Normal range of motion. Neck supple.  Cardiovascular: Normal rate and regular rhythm.  Pulmonary/Chest: No stridor. No respiratory distress. She has wheezes. She has no rales. She exhibits no tenderness.  Pt reclining, no tripoding, she is refusing to speak.  Abdominal: Soft. Bowel sounds are normal. There is no tenderness. There is no rebound and no guarding.  Musculoskeletal: Normal  range of motion.  Neurological: She is alert and oriented to person, place, and time.  Skin: Capillary refill takes less than 2 seconds.  Psychiatric: She has a normal mood and affect.  Nursing note and vitals reviewed.    ED Treatments / Results  Labs (all labs ordered are listed, but only abnormal results are displayed) Labs Reviewed  CBC WITH DIFFERENTIAL/PLATELET - Abnormal; Notable for the following components:      Result Value   Hemoglobin 11.1 (*)    HCT 35.4 (*)    MCH 25.0 (*)    All other components within normal limits  BASIC METABOLIC PANEL - Abnormal; Notable for the following components:   Potassium 3.4 (*)    Glucose, Bld 103 (*)    All other components within normal limits  RESPIRATORY PANEL BY PCR  BLOOD GAS, ARTERIAL  RAPID URINE DRUG SCREEN, HOSP PERFORMED  INFLUENZA PANEL BY PCR (TYPE A & B)  HIV ANTIBODY (ROUTINE TESTING)  MAGNESIUM  I-STAT BETA HCG BLOOD, ED ([facility], [facility], [facility] ONLY)    EKG  EKG Interpretation None       Radiology Dg Chest 2 View  Result Date: 06/20/2017 CLINICAL DATA:  Shortness of breath and wheezing for 2 days EXAM: CHEST  2 VIEW COMPARISON:  04/10/2017 FINDINGS: Cardiac shadow is stable at the upper limits of normal in size. The lungs are well aerated bilaterally. No focal infiltrate or sizable effusion is seen. No acute bony abnormality is noted. IMPRESSION: No active cardiopulmonary disease. Electronically Signed   By: Alcide Clever M.D.   On: 06/20/2017 13:21    Procedures Procedures (including critical care time)  Medications Ordered in ED Medications  0.9 %  sodium chloride infusion ( Intravenous Rate/Dose Change 06/20/17 1608)  albuterol (PROVENTIL,VENTOLIN) solution continuous neb (0 mg/hr Nebulization Stopped 06/20/17 1603)  doxycycline (VIBRAMYCIN) 100 mg in dextrose 5 % 250 mL IVPB (100 mg Intravenous New Bag/Given 06/20/17 1626)  ipratropium-albuterol (DUONEB) 0.5-2.5 (3) MG/3ML nebulizer solution 3 mL (3 mLs Nebulization  Not Given 06/20/17 1555)  methylPREDNISolone sodium succinate (SOLU-MEDROL) 125 mg/2 mL injection 60 mg (not administered)  fluticasone (FLONASE) 50 MCG/ACT nasal spray 2 spray (2 sprays Each Nare Given 06/20/17 1627)  meloxicam (MOBIC) tablet 7.5 mg (7.5  mg Oral Given 06/20/17 1628)  methocarbamol (ROBAXIN) tablet 1,000 mg (1,000 mg Oral Given 06/20/17 1627)  verapamil (VERELAN) 24 hr capsule 100 mg (not administered)  zolpidem (AMBIEN) tablet 5 mg (not administered)  benzonatate (TESSALON) capsule 200 mg (200 mg Oral Given 06/20/17 1628)  enoxaparin (LOVENOX) injection 40 mg (not administered)  sodium chloride flush (NS) 0.9 % injection 3 mL (not administered)  acetaminophen (TYLENOL) tablet 650 mg (not administered)    Or  acetaminophen (TYLENOL) suppository 650 mg (not administered)  promethazine (PHENERGAN) tablet 12.5 mg (not administered)  oxymetazoline (AFRIN) 0.05 % nasal spray 1 spray (not administered)  pantoprazole (PROTONIX) EC tablet 40 mg (not administered)  albuterol (PROVENTIL) (2.5 MG/3ML) 0.083% nebulizer solution 5 mg (2.5 mg Nebulization Given 06/20/17 1241)  methylPREDNISolone sodium succinate (SOLU-MEDROL) 125 mg/2 mL injection 125 mg (125 mg Intravenous Given 06/20/17 1448)  magnesium sulfate IVPB 2 g 50 mL (0 g Intravenous Stopped 06/20/17 1549)  LORazepam (ATIVAN) injection 1 mg (1 mg Intravenous Given 06/20/17 1449)  ipratropium (ATROVENT) nebulizer solution 0.5 mg (0.5 mg Nebulization Given 06/20/17 1445)  amoxicillin (AMOXIL) capsule 1,000 mg (1,000 mg Oral Given 06/20/17 1449)     Initial Impression / Assessment and Plan / ED Course  I have reviewed the triage vital signs and the nursing notes.  Pertinent labs & imaging results that were available during my care of the patient were reviewed by me and considered in my medical decision making (see chart for details).     Vitals:   06/20/17 1445 06/20/17 1515 06/20/17 1530 06/20/17 1615  BP:  110/79 127/69 130/77    Pulse: 88 77 97   Resp: (!) 22 (!) 21 (!) 21 (!) 31  Temp:      TempSrc:      SpO2: 99% 100% 100%   Weight:      Height:        Medications  0.9 %  sodium chloride infusion ( Intravenous Rate/Dose Change 06/20/17 1608)  albuterol (PROVENTIL,VENTOLIN) solution continuous neb (0 mg/hr Nebulization Stopped 06/20/17 1603)  doxycycline (VIBRAMYCIN) 100 mg in dextrose 5 % 250 mL IVPB (100 mg Intravenous New Bag/Given 06/20/17 1626)  ipratropium-albuterol (DUONEB) 0.5-2.5 (3) MG/3ML nebulizer solution 3 mL (3 mLs Nebulization Not Given 06/20/17 1555)  methylPREDNISolone sodium succinate (SOLU-MEDROL) 125 mg/2 mL injection 60 mg (not administered)  fluticasone (FLONASE) 50 MCG/ACT nasal spray 2 spray (2 sprays Each Nare Given 06/20/17 1627)  meloxicam (MOBIC) tablet 7.5 mg (7.5 mg Oral Given 06/20/17 1628)  methocarbamol (ROBAXIN) tablet 1,000 mg (1,000 mg Oral Given 06/20/17 1627)  verapamil (VERELAN) 24 hr capsule 100 mg (not administered)  zolpidem (AMBIEN) tablet 5 mg (not administered)  benzonatate (TESSALON) capsule 200 mg (200 mg Oral Given 06/20/17 1628)  enoxaparin (LOVENOX) injection 40 mg (not administered)  sodium chloride flush (NS) 0.9 % injection 3 mL (not administered)  acetaminophen (TYLENOL) tablet 650 mg (not administered)    Or  acetaminophen (TYLENOL) suppository 650 mg (not administered)  promethazine (PHENERGAN) tablet 12.5 mg (not administered)  oxymetazoline (AFRIN) 0.05 % nasal spray 1 spray (not administered)  pantoprazole (PROTONIX) EC tablet 40 mg (not administered)  albuterol (PROVENTIL) (2.5 MG/3ML) 0.083% nebulizer solution 5 mg (2.5 mg Nebulization Given 06/20/17 1241)  methylPREDNISolone sodium succinate (SOLU-MEDROL) 125 mg/2 mL injection 125 mg (125 mg Intravenous Given 06/20/17 1448)  magnesium sulfate IVPB 2 g 50 mL (0 g Intravenous Stopped 06/20/17 1549)  LORazepam (ATIVAN) injection 1 mg (1 mg Intravenous Given 06/20/17 1449)  ipratropium (  ATROVENT) nebulizer  solution 0.5 mg (0.5 mg Nebulization Given 06/20/17 1445)  amoxicillin (AMOXIL) capsule 1,000 mg (1,000 mg Oral Given 06/20/17 1449)    Julie Kerr is 41 y.o. female presenting with shortness of breath and wheezing.  She also has some ear pain however tympanic membranes are not injected or bulging.  Patient wheezing significantly she is given Solu-Medrol, magnesium, hour long nebulizer with little relief.  Will be an unassigned admission to try at hospitalist.    Final Clinical Impressions(s) / ED Diagnoses   Final diagnoses:  Wheezing    ED Discharge Orders    None       Lynetta Mareisciotta, Mardella Laymanicole, PA-C 06/20/17 1632    Arby BarrettePfeiffer, Marcy, MD 06/21/17 [phone]

## 2017-06-20 NOTE — ED Triage Notes (Signed)
Pt arrived from home c/o SOB for the last day.  Pt with audible wheeze, labored respirations, and nasal flaring at triage.  Given Neb treatment.  Ear pain x2 days denies injury or drainage.  Pt out of home inhaler.

## 2017-06-21 ENCOUNTER — Other Ambulatory Visit: Payer: Self-pay

## 2017-06-21 ENCOUNTER — Observation Stay (HOSPITAL_COMMUNITY): Payer: Medicare Other

## 2017-06-21 ENCOUNTER — Encounter (HOSPITAL_COMMUNITY): Payer: Self-pay | Admitting: General Practice

## 2017-06-21 DIAGNOSIS — E669 Obesity, unspecified: Secondary | ICD-10-CM | POA: Diagnosis present

## 2017-06-21 DIAGNOSIS — G8929 Other chronic pain: Secondary | ICD-10-CM | POA: Diagnosis present

## 2017-06-21 DIAGNOSIS — Z8249 Family history of ischemic heart disease and other diseases of the circulatory system: Secondary | ICD-10-CM | POA: Diagnosis not present

## 2017-06-21 DIAGNOSIS — Z981 Arthrodesis status: Secondary | ICD-10-CM | POA: Diagnosis not present

## 2017-06-21 DIAGNOSIS — R05 Cough: Secondary | ICD-10-CM

## 2017-06-21 DIAGNOSIS — F411 Generalized anxiety disorder: Secondary | ICD-10-CM

## 2017-06-21 DIAGNOSIS — D72829 Elevated white blood cell count, unspecified: Secondary | ICD-10-CM | POA: Diagnosis not present

## 2017-06-21 DIAGNOSIS — R739 Hyperglycemia, unspecified: Secondary | ICD-10-CM | POA: Diagnosis present

## 2017-06-21 DIAGNOSIS — F410 Panic disorder [episodic paroxysmal anxiety] without agoraphobia: Secondary | ICD-10-CM | POA: Diagnosis present

## 2017-06-21 DIAGNOSIS — J383 Other diseases of vocal cords: Secondary | ICD-10-CM

## 2017-06-21 DIAGNOSIS — E871 Hypo-osmolality and hyponatremia: Secondary | ICD-10-CM | POA: Diagnosis present

## 2017-06-21 DIAGNOSIS — I10 Essential (primary) hypertension: Secondary | ICD-10-CM

## 2017-06-21 DIAGNOSIS — J069 Acute upper respiratory infection, unspecified: Secondary | ICD-10-CM | POA: Diagnosis present

## 2017-06-21 DIAGNOSIS — F1120 Opioid dependence, uncomplicated: Secondary | ICD-10-CM | POA: Diagnosis present

## 2017-06-21 DIAGNOSIS — J449 Chronic obstructive pulmonary disease, unspecified: Secondary | ICD-10-CM

## 2017-06-21 DIAGNOSIS — J9601 Acute respiratory failure with hypoxia: Secondary | ICD-10-CM | POA: Diagnosis present

## 2017-06-21 DIAGNOSIS — R062 Wheezing: Secondary | ICD-10-CM | POA: Diagnosis present

## 2017-06-21 DIAGNOSIS — R0600 Dyspnea, unspecified: Secondary | ICD-10-CM | POA: Diagnosis not present

## 2017-06-21 DIAGNOSIS — Z7951 Long term (current) use of inhaled steroids: Secondary | ICD-10-CM | POA: Diagnosis not present

## 2017-06-21 DIAGNOSIS — F419 Anxiety disorder, unspecified: Secondary | ICD-10-CM | POA: Diagnosis not present

## 2017-06-21 DIAGNOSIS — R51 Headache: Secondary | ICD-10-CM | POA: Diagnosis not present

## 2017-06-21 DIAGNOSIS — F329 Major depressive disorder, single episode, unspecified: Secondary | ICD-10-CM | POA: Diagnosis present

## 2017-06-21 DIAGNOSIS — E876 Hypokalemia: Secondary | ICD-10-CM | POA: Diagnosis present

## 2017-06-21 DIAGNOSIS — Z930 Tracheostomy status: Secondary | ICD-10-CM | POA: Diagnosis not present

## 2017-06-21 DIAGNOSIS — Z87891 Personal history of nicotine dependence: Secondary | ICD-10-CM | POA: Diagnosis not present

## 2017-06-21 DIAGNOSIS — K219 Gastro-esophageal reflux disease without esophagitis: Secondary | ICD-10-CM | POA: Diagnosis present

## 2017-06-21 DIAGNOSIS — Z823 Family history of stroke: Secondary | ICD-10-CM | POA: Diagnosis not present

## 2017-06-21 DIAGNOSIS — R0602 Shortness of breath: Secondary | ICD-10-CM | POA: Diagnosis not present

## 2017-06-21 DIAGNOSIS — Z79899 Other long term (current) drug therapy: Secondary | ICD-10-CM | POA: Diagnosis not present

## 2017-06-21 DIAGNOSIS — H9201 Otalgia, right ear: Secondary | ICD-10-CM

## 2017-06-21 DIAGNOSIS — Z6834 Body mass index (BMI) 34.0-34.9, adult: Secondary | ICD-10-CM | POA: Diagnosis not present

## 2017-06-21 LAB — COMPREHENSIVE METABOLIC PANEL
ALK PHOS: 59 U/L (ref 38–126)
ALT: 16 U/L (ref 14–54)
ANION GAP: 7 (ref 5–15)
AST: 19 U/L (ref 15–41)
Albumin: 3.4 g/dL — ABNORMAL LOW (ref 3.5–5.0)
BILIRUBIN TOTAL: 0.2 mg/dL — AB (ref 0.3–1.2)
BUN: 9 mg/dL (ref 6–20)
CALCIUM: 9.3 mg/dL (ref 8.9–10.3)
CO2: 18 mmol/L — ABNORMAL LOW (ref 22–32)
CREATININE: 0.84 mg/dL (ref 0.44–1.00)
Chloride: 109 mmol/L (ref 101–111)
Glucose, Bld: 219 mg/dL — ABNORMAL HIGH (ref 65–99)
Potassium: 4.7 mmol/L (ref 3.5–5.1)
SODIUM: 134 mmol/L — AB (ref 135–145)
TOTAL PROTEIN: 6.5 g/dL (ref 6.5–8.1)

## 2017-06-21 LAB — RAPID URINE DRUG SCREEN, HOSP PERFORMED
AMPHETAMINES: NOT DETECTED
BENZODIAZEPINES: NOT DETECTED
Barbiturates: NOT DETECTED
Cocaine: NOT DETECTED
OPIATES: POSITIVE — AB
TETRAHYDROCANNABINOL: NOT DETECTED

## 2017-06-21 LAB — CBC
HCT: 36 % (ref 36.0–46.0)
HEMOGLOBIN: 11.3 g/dL — AB (ref 12.0–15.0)
MCH: 25.2 pg — AB (ref 26.0–34.0)
MCHC: 31.4 g/dL (ref 30.0–36.0)
MCV: 80.4 fL (ref 78.0–100.0)
PLATELETS: 324 10*3/uL (ref 150–400)
RBC: 4.48 MIL/uL (ref 3.87–5.11)
RDW: 14.7 % (ref 11.5–15.5)
WBC: 18 10*3/uL — AB (ref 4.0–10.5)

## 2017-06-21 LAB — GLUCOSE, CAPILLARY
GLUCOSE-CAPILLARY: 164 mg/dL — AB (ref 65–99)
Glucose-Capillary: 244 mg/dL — ABNORMAL HIGH (ref 65–99)

## 2017-06-21 LAB — RESPIRATORY PANEL BY PCR
Adenovirus: NOT DETECTED
Bordetella pertussis: NOT DETECTED
CHLAMYDOPHILA PNEUMONIAE-RVPPCR: NOT DETECTED
Coronavirus 229E: NOT DETECTED
Coronavirus HKU1: NOT DETECTED
Coronavirus NL63: NOT DETECTED
Coronavirus OC43: NOT DETECTED
INFLUENZA A-RVPPCR: NOT DETECTED
INFLUENZA B-RVPPCR: NOT DETECTED
MYCOPLASMA PNEUMONIAE-RVPPCR: NOT DETECTED
Metapneumovirus: NOT DETECTED
PARAINFLUENZA VIRUS 4-RVPPCR: NOT DETECTED
Parainfluenza Virus 1: NOT DETECTED
Parainfluenza Virus 2: NOT DETECTED
Parainfluenza Virus 3: NOT DETECTED
RESPIRATORY SYNCYTIAL VIRUS-RVPPCR: NOT DETECTED
Rhinovirus / Enterovirus: NOT DETECTED

## 2017-06-21 LAB — MRSA PCR SCREENING: MRSA BY PCR: NEGATIVE

## 2017-06-21 LAB — HIV ANTIBODY (ROUTINE TESTING W REFLEX): HIV Screen 4th Generation wRfx: NONREACTIVE

## 2017-06-21 MED ORDER — INSULIN ASPART 100 UNIT/ML ~~LOC~~ SOLN
0.0000 [IU] | Freq: Every day | SUBCUTANEOUS | Status: DC
  Administered 2017-06-21: 2 [IU] via SUBCUTANEOUS

## 2017-06-21 MED ORDER — IPRATROPIUM-ALBUTEROL 0.5-2.5 (3) MG/3ML IN SOLN
3.0000 mL | Freq: Three times a day (TID) | RESPIRATORY_TRACT | Status: DC
  Administered 2017-06-21 – 2017-06-22 (×4): 3 mL via RESPIRATORY_TRACT
  Filled 2017-06-21 (×5): qty 3

## 2017-06-21 MED ORDER — GUAIFENESIN ER 600 MG PO TB12
1200.0000 mg | ORAL_TABLET | Freq: Two times a day (BID) | ORAL | Status: DC
  Administered 2017-06-21 – 2017-06-22 (×3): 1200 mg via ORAL
  Filled 2017-06-21 (×3): qty 2

## 2017-06-21 MED ORDER — INSULIN ASPART 100 UNIT/ML ~~LOC~~ SOLN
0.0000 [IU] | Freq: Three times a day (TID) | SUBCUTANEOUS | Status: DC
  Administered 2017-06-21: 2 [IU] via SUBCUTANEOUS
  Administered 2017-06-22 (×2): 3 [IU] via SUBCUTANEOUS

## 2017-06-21 MED ORDER — IOPAMIDOL (ISOVUE-370) INJECTION 76%
INTRAVENOUS | Status: AC
  Administered 2017-06-21: 100 mL
  Filled 2017-06-21: qty 100

## 2017-06-21 NOTE — Evaluation (Signed)
Physical Therapy Evaluation Patient Details Name: Julie Kerr MRN: [id number] DOB: 02/09/1976 Today's Date: 06/21/2017   History of Present Illness  pt is a 41 y/o female with PMH of vocal cord dysfunction, tracheostomy, HTN, and chronic pain, admitted with worsening dyspnea.   Clinical Impression  Pt tolerated session well, ambulated on RA and O2 maintained 96-97%.  Pt became dyspneic with ambulation, improved with cues for pursed lip breathing.  Would benefit from 1-2 f/u visits for progression of ambulation and education for energy conservation.      Follow Up Recommendations Home health PT;Supervision - Intermittent    Equipment Recommendations  None recommended by PT    Recommendations for Other Services OT consult     Precautions / Restrictions Precautions Precautions: Fall Precaution Comments: dyspnea on exertion, c/o some dizziness with standing Restrictions Weight Bearing Restrictions: No      Mobility  Bed Mobility Overal bed mobility: Modified Independent                Transfers Overall transfer level: Modified independent Equipment used: Rolling walker (2 wheeled)             General transfer comment: pt keeps both UEs on walker for sit<>stand  Ambulation/Gait Ambulation/Gait assistance: Supervision;Min guard Ambulation Distance (Feet): 150 Feet Assistive device: Rolling walker (2 wheeled) Gait Pattern/deviations: WFL(Within Functional Limits)     General Gait Details: O2 96-97% on RA during ambulation but pt dyspneic   Stairs            Wheelchair Mobility    Modified Rankin (Stroke Patients Only)       Balance Overall balance assessment: No apparent balance deficits (not formally assessed)                                           Pertinent Vitals/Pain Pain Assessment: No/denies pain    Home Living Family/patient expects to be discharged to:: Private residence Living Arrangements: Children Available  Help at Discharge: Family;Personal care attendant;Available PRN/intermittently Type of Home: House Home Access: Stairs to enter   Enter[employer] CorporationEntrance Stairs-Number of Steps: 2 Home Layout: One level        Prior Function Level of Independence: Needs assistance   Gait / Transfers Assistance Needed: mod I  ADL's / Homemaking Assistance Needed: assist with ADLs, IADLs at baseline        Hand Dominance        Extremity/Trunk Assessment        Lower Extremity Assessment Lower Extremity Assessment: Overall WFL for tasks assessed    Cervical / Trunk Assessment Cervical / Trunk Assessment: Normal  Communication   Communication: No difficulties  Cognition Arousal/Alertness: Awake/alert Behavior During Therapy: Flat affect Overall Cognitive Status: Within Functional Limits for tasks assessed                                        General Comments      Exercises     Assessment/Plan    PT Assessment Patient needs continued PT services  PT Problem List Decreased activity tolerance;Decreased knowledge of use of DME       PT Treatment Interventions Gait training;Functional mobility training;Therapeutic exercise    PT Goals (Current goals can be found in the Care Plan section)  Acute Rehab PT Goals Patient Stated Goal:  to get home PT Goal Formulation: With patient Time For Goal Achievement: 06/28/17 Potential to Achieve Goals: Good    Frequency Min 2X/week   Barriers to discharge Decreased caregiver support      Co-evaluation               AM-PAC PT "6 Clicks" Daily Activity  Outcome Measure Difficulty turning over in bed (including adjusting bedclothes, sheets and blankets)?: None Difficulty moving from lying on back to sitting on the side of the bed? : None Difficulty sitting down on and standing up from a chair with arms (e.g., wheelchair, bedside commode, etc,.)?: A Little Help needed moving to and from a bed to chair (including a  wheelchair)?: A Little Help needed walking in hospital room?: A Little Help needed climbing 3-5 steps with a railing? : A Lot 6 Click Score: 19    End of Session   Activity Tolerance: Patient tolerated treatment well Patient left: in bed;with call bell/phone within reach Nurse Communication: Mobility status PT Visit Diagnosis: Difficulty in walking, not elsewhere classified (R26.2)    Time: 1535-1550 PT Time Calculation (min) (ACUTE ONLY): 15 min   Charges:   PT Evaluation $PT Eval Low Complexity: 1 Low     PT G Codes:   PT G-Codes **NOT FOR INPATIENT CLASS** Functional Assessment Tool Used: AM-PAC 6 Clicks Basic Mobility Functional Limitation: Mobility: Walking and moving around Mobility: Walking and Moving Around Current Status ([id number]([id number]): At least 20 percent but less than 40 percent impaired, limited or restricted Mobility: Walking and Moving Around Goal Status [phone](G8979): At least 1 percent but less than 20 percent impaired, limited or restricted    Stephania FragminCaitlin E Ailyn Gladd 06/21/2017, 3:56 PM

## 2017-06-21 NOTE — Progress Notes (Signed)
PROGRESS NOTE    Julie Kerr  TMH:[id number] DOB: [birth year] DOA: 06/20/2017 PCP: Patient, No Pcp Per   Brief Narrative:  Julie Kerr is a 41 y.o. 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  Patient presented to the ER with progressive shortness of breath over 24 hours.  In triage she was documented with audible wheezing, labored respiration and nasal flaring. It was not documented whether she was hypoxemic on room air or not. Patient reported to triage nurse she was out of home inhaler.  Chest x-ray unremarkable.  Patient was also complaining of right ear pain with tympanic membrane exam not consistent with otitis media.  Patient reports nonproductive wet sounding cough but has not had fevers or chills or sick contacts.  Labs were normal except for mild hypokalemia and patient did not have any leukocytosis.  Patient has been given multiple albuterol nebulizer treatments, IV magnesium, Solu-Medrol, Ativan and amoxicillin in the ER without improvement in symptoms.    Because of lack of improvement ABG was ordered but patient refused this test unless she was given a dose of narcotic pain medication. She was admitted for Acute Dyspnea and found to be Hypoxic on Ambulation.   Assessment & Plan:   Principal Problem:   Acute dyspnea Active Problems:   Upper airway cough syndrome, severe, with clinical VCD   Generalized anxiety disorder   Chronic asthmatic bronchitis (HCC)   Asthma   Leukocytosis   Anxiety and depression   Chronic narcotic dependence (HCC)   Hypertension   Vocal cord dysfunction   Obesity (BMI 30.0-34.9)   Right ear pain  Acute Respiratory failure with Hypoxia with unknown Etiology in the  setting of Vocal Cord Dysfunction  -Patient presented with acute dyspnea without hypoxemia, normal chest x-ray, and clinical exam consistent with upper airway wheezing -Patient was not hypoxic on admission but was hypoxic on Ambulation -Check CTA w/wo Contrast to r/o PE -RSV Panel and Influenza Negative -C/w DuoNeb 3 mL TID -C/w Budesonide 0.25 mg BID -Given Amoxicillin and Doxycycline on Admission -C/w Benzonatate 200 mg po TID and add Guaifenesin 1,200 mg BID -Given IV Mag Sulfate yesterday  -C/w Solumedrol 60 mg IV q6h and start Steroid Taper at D/C  -C/w Pantoprazole 40 mg po BID -Will hold off Abx for now -C/w Continuous Pulse Oximetry and Maintain O2 Saturations >92% -C/w Incentive Spirometry -May need to Check ECHO if not improving -Check PT Evaluation   Leukocytosis -Likely 2/2 to IV Steroid Demargination -Patient's WBC went from 8.1 -> 18.0 -Continue to Monitor for S/Sx of Infection -Repeat CBC in AM  Chronic Narcotic dependence/history of polysubstance abuse/Generalized anxiety disorder -Patient reports has chronic low back and hip pain; self-reports pain clinic in [place], [place] previously prescribed her oxycodone and Percocet but it is unclear if she has an active prescription available at home -When further questioned regarding the pain clinic, patient reports that she had too many visits where she did not show up for an appointment and/or was unable to pay and therefore is no longer active with this pain clinic. -It has been documented during previous encounters that patient has demonstrated drug-seeking behaviors and yesterday she refused an ABG unless she was given pain medication -Old medication reconciliation lists prn Xanax as medication as well  but this has not been confirmed as being a current active prescription.  During previous admission patient apparently had been on Lyrica and Seroquel but uncertain if she still takes these medications to treat her  anxiety disorder and panic attacks. -Since it is unclear as to whether she has active prescription for narcotics and therefore has been taking regularly we will allow for Oxycodone 5 mg every 6 hours prn to prevent potential narcotic withdrawal.   -We will not prescribe narcotics or benzodiazepines at time of discharge.  The patient was informed of this policy at time of admission. -UDS was positive for Opiates  -Given 1 mg IV Lorazepam x 2 yesterday and given 1 mg IV Morpine -C/w Meloxicam 7.5 mg po Daily   Headache and Right Ear Pain -Patient reports significant right ear pain with exam not consistent with acute otitis media; Complains of some blurred vision and Headache  -Symptom management  -Tylenol and/or preadmission Mobic for mild pain -Intranasal Oxymetazoline 1 spray Each Nare BID -Continue preadmission Flonase -Check ESR and CRP -Check Head CT w/o Contrast  Hypertension -C/w Home Verapamil 120 mg po qHS -C/w Hydralazine 5-10 mg IV q4hprn SBP >160  Obesity (BMI 30.0-34.9) -Weight Loss Counseling given   Hyperglycemia -In the Setting of IV Steroid Use -Check HbA1c -Start Sensitive Novolog SSI AC/HS  Hyponatremia -Mild. Na+ was 134 -Repeat CMP in AM   DVT prophylaxis: Enoxaparin 40 mg sq q24h Code Status: FULL CODE Family Communication: Discussed with family after patient encounter Disposition Plan: Likely Home; Will get PT Evaluation  Consultants:   None   Procedures:  None   Antimicrobials:  Anti-infectives (From admission, onward)   Start     Dose/Rate Route Frequency Ordered Stop   06/20/17 1600  doxycycline (VIBRAMYCIN) 100 mg in dextrose 5 % 250 mL IVPB     100 mg 125 mL/hr over 120 Minutes Intravenous  Once 06/20/17 1536 06/20/17 1826   06/20/17 1415  amoxicillin (AMOXIL) capsule 1,000 mg     1,000 mg Oral  Once 06/20/17 1400 06/20/17 1449     Subjective: Seen and examined and stated she still felt SOB and pain was uncontrolled. No Nausea or  Vomiting. While Ambulating patient's O2 Saturations dropped.   Objective: Vitals:   06/21/17 1235 06/21/17 1237 06/21/17 1240 06/21/17 1431  BP:    96/82  Pulse:    98  Resp:    16  Temp:    99.1 F (37.3 C)  TempSrc:    Oral  SpO2: (!) 84% 94% 97% 96%  Weight:      Height:        Intake/Output Summary (Last 24 hours) at 06/21/2017 1440 Last data filed at 06/21/2017 0843 Gross per 24 hour  Intake 2482.5 ml  Output 2050 ml  Net 432.5 ml   Filed Weights   06/20/17 1244 06/20/17 1722  Weight: 97.5 kg (215 lb) 68 kg (150 lb)   Examination: Physical Exam:  Constitutional: WN/WD obese AAF in NAD and appears calm and comfortable Eyes:  Lids and conjunctivae normal, sclerae anicteric  ENMT: External Ears, Nose appear normal. Grossly normal hearing. Mucous membranes are moist.  Neck: Appears normal, supple, no cervical masses, normal ROM, no appreciable thyromegaly Respiratory: Diminished to auscultation bilaterally but has upper cough and mild upper airway wheezing. Has some coughing with non-productive sputum. Slightly increased RR.  Cardiovascular: RRR, no murmurs / rubs / gallops. S1 and S2 auscultated. No extremity edema. Abdomen: Soft, non-tender, non-distended. No masses palpated. No  appreciable hepatosplenomegaly.  GU: Deferred. Musculoskeletal: No clubbing / cyanosis of digits/nails. No joint deformity upper and lower extremities. Good ROM, no contractures.  Skin: No rashes, lesions, ulcers on a limited skin eval. No induration; Warm and dry.  Neurologic: CN 2-12 grossly intact with no focal deficits. Romberg sign and cerebellar reflexes not assessed.  Psychiatric: Normal judgment and insight. Alert and oriented x 3. Normal mood and appropriate affect.   Data Reviewed: I have personally reviewed following labs and imaging studies  CBC: Recent Labs  Lab 06/20/17 1455 06/21/17 0343  WBC 8.1 18.0*  NEUTROABS 5.7  --   HGB 11.1* 11.3*  HCT 35.4* 36.0  MCV 79.7 80.4    PLT 310 562   Basic Metabolic Panel: Recent Labs  Lab 06/20/17 1455 06/20/17 1726 06/21/17 0343  NA 137  --  134*  K 3.4*  --  4.7  CL 105  --  109  CO2 26  --  18*  GLUCOSE 103*  --  219*  BUN 8  --  9  CREATININE 0.80  --  0.84  CALCIUM 9.1  --  9.3  MG  --  2.1  --    GFR: Estimated Creatinine Clearance: 84.2 mL/min (by C-G formula based on SCr of 0.84 mg/dL). Liver Function Tests: Recent Labs  Lab 06/21/17 0343  AST 19  ALT 16  ALKPHOS 59  BILITOT 0.2*  PROT 6.5  ALBUMIN 3.4*   No results for input(s): LIPASE, AMYLASE in the last 168 hours. No results for input(s): AMMONIA in the last 168 hours. Coagulation Profile: No results for input(s): INR, PROTIME in the last 168 hours. Cardiac Enzymes: No results for input(s): CKTOTAL, CKMB, CKMBINDEX, TROPONINI in the last 168 hours. BNP (last 3 results) No results for input(s): PROBNP in the last 8760 hours. HbA1C: No results for input(s): HGBA1C in the last 72 hours. CBG: No results for input(s): GLUCAP in the last 168 hours. Lipid Profile: No results for input(s): CHOL, HDL, LDLCALC, TRIG, CHOLHDL, LDLDIRECT in the last 72 hours. Thyroid Function Tests: No results for input(s): TSH, T4TOTAL, FREET4, T3FREE, THYROIDAB in the last 72 hours. Anemia Panel: No results for input(s): VITAMINB12, FOLATE, FERRITIN, TIBC, IRON, RETICCTPCT in the last 72 hours. Sepsis Labs: No results for input(s): PROCALCITON, LATICACIDVEN in the last 168 hours.  Recent Results (from the past 240 hour(s))  Respiratory Panel by PCR     Status: None   Collection Time: 06/20/17  4:26 PM  Result Value Ref Range Status   Adenovirus NOT DETECTED NOT DETECTED Final   Coronavirus 229E NOT DETECTED NOT DETECTED Final   Coronavirus HKU1 NOT DETECTED NOT DETECTED Final   Coronavirus NL63 NOT DETECTED NOT DETECTED Final   Coronavirus OC43 NOT DETECTED NOT DETECTED Final   Metapneumovirus NOT DETECTED NOT DETECTED Final   Rhinovirus /  Enterovirus NOT DETECTED NOT DETECTED Final   Influenza A NOT DETECTED NOT DETECTED Final   Influenza B NOT DETECTED NOT DETECTED Final   Parainfluenza Virus 1 NOT DETECTED NOT DETECTED Final   Parainfluenza Virus 2 NOT DETECTED NOT DETECTED Final   Parainfluenza Virus 3 NOT DETECTED NOT DETECTED Final   Parainfluenza Virus 4 NOT DETECTED NOT DETECTED Final   Respiratory Syncytial Virus NOT DETECTED NOT DETECTED Final   Bordetella pertussis NOT DETECTED NOT DETECTED Final   Chlamydophila pneumoniae NOT DETECTED NOT DETECTED Final   Mycoplasma pneumoniae NOT DETECTED NOT DETECTED Final  MRSA PCR Screening     Status: None  Collection Time: 06/21/17 10:17 AM  Result Value Ref Range Status   MRSA by PCR NEGATIVE NEGATIVE Final    Comment:        The GeneXpert MRSA Assay (FDA approved for NASAL specimens only), is one component of a comprehensive MRSA colonization surveillance program. It is not intended to diagnose MRSA infection nor to guide or monitor treatment for MRSA infections.     Radiology Studies: Dg Chest 2 View  Result Date: 06/20/2017 CLINICAL DATA:  Shortness of breath and wheezing for 2 days EXAM: CHEST  2 VIEW COMPARISON:  04/10/2017 FINDINGS: Cardiac shadow is stable at the upper limits of normal in size. The lungs are well aerated bilaterally. No focal infiltrate or sizable effusion is seen. No acute bony abnormality is noted. IMPRESSION: No active cardiopulmonary disease. Electronically Signed   By: Inez Catalina M.D.   On: 06/20/2017 13:21   Ct Head Wo Contrast  Result Date: 06/21/2017 CLINICAL DATA:  Headache, shortness of breath and chest tightness for 2 days. EXAM: CT HEAD WITHOUT CONTRAST TECHNIQUE: Contiguous axial images were obtained from the base of the skull through the vertex without intravenous contrast. COMPARISON:  None. FINDINGS: Brain: Ventricles are normal in size and configuration. All areas of the brain demonstrate normal gray-white matter  differentiation. There is no mass, hemorrhage, edema or other evidence of acute parenchymal abnormality. No extra-axial hemorrhage. Vascular: No hyperdense vessel or unexpected calcification. Skull: Normal. Negative for fracture or focal lesion. Sinuses/Orbits: No acute finding. Other: None. IMPRESSION: Normal head CT. Electronically Signed   By: Franki Cabot M.D.   On: 06/21/2017 14:13   Ct Angio Chest Pe W Or Wo Contrast  Result Date: 06/21/2017 CLINICAL DATA:  Headache, shortness of breath and chest tightness for 2 days. EXAM: CT ANGIOGRAPHY CHEST WITH CONTRAST TECHNIQUE: Multidetector CT imaging of the chest was performed using the standard protocol during bolus administration of intravenous contrast. Multiplanar CT image reconstructions and MIPs were obtained to evaluate the vascular anatomy. CONTRAST:  100 cc Isovue 370 COMPARISON:  None. FINDINGS: Cardiovascular: There is no pulmonary embolism identified within the main, lobar or segmental pulmonary arteries bilaterally, although some of the most peripheral segmental and subsegmental pulmonary arteries are difficult to definitively characterize due to mild patient breathing motion artifact. Thoracic aorta is normal in caliber and configuration. Heart size is upper normal. No pericardial effusion. Mediastinum/Nodes: Esophagus is unremarkable. No mass or enlarged lymph nodes seen within the mediastinum or perihilar regions. Trachea and central bronchi are unremarkable. Lungs/Pleura: Lungs are clear.  No pleural effusion or pneumothorax. Upper Abdomen: Limited images of the upper abdomen are unremarkable. No acute findings. Musculoskeletal: No acute or suspicious osseous finding. Review of the MIP images confirms the above findings. IMPRESSION: 1. No pulmonary embolism seen, with mild study limitations detailed above. 2. Overall, no acute findings. Heart size is normal. No pericardial effusion. No aortic aneurysm. Lungs are clear. Electronically Signed    By: Franki Cabot M.D.   On: 06/21/2017 14:11   Scheduled Meds: . benzonatate  200 mg Oral TID  . budesonide (PULMICORT) nebulizer solution  0.25 mg Nebulization BID  . enoxaparin (LOVENOX) injection  40 mg Subcutaneous Q24H  . fluticasone  2 spray Each Nare BID  . guaiFENesin  1,200 mg Oral BID  . ipratropium-albuterol  3 mL Nebulization TID  . meloxicam  7.5 mg Oral Daily  . methocarbamol  1,000 mg Oral BID  . methylPREDNISolone (SOLU-MEDROL) injection  60 mg Intravenous Q6H  . oxymetazoline  1 spray Each Nare BID  . pantoprazole  40 mg Oral BID  . sodium chloride flush  3 mL Intravenous Q12H  . verapamil  120 mg Oral QHS  . zolpidem  5 mg Oral QHS   Continuous Infusions: . sodium chloride 100 mL/hr at 06/21/17 0501    LOS: 0 days   Kerney Elbe, DO [facility] Pager [phone]  If 7PM-7AM, please contact night-coverage [email or web address] Password [email or web address] 06/21/2017, 2:40 PM

## 2017-06-21 NOTE — Evaluation (Signed)
Occupational Therapy Evaluation Patient Details Name: Julie Kerr MRN: [id number] DOB: 11/03/1975 Today's Date: 06/21/2017    History of Present Illness pt is a 41 y/o female with PMH of vocal cord dysfunction, tracheostomy, HTN, and chronic pain, admitted with worsening dyspnea.    Clinical Impression   PTA, pt was living alone and receiving help with ADLs and IADLs from PCA who come each day for two hours. Pt currently performing UB ADLs, ADLs in standing, and functional mobility at supervision-Min guard level. SpO2 dropping to 96% on room air during activity.Pt would benefit from further acute OT to educate on energy conservation during ADLs. Recommend dc home once medially stable per physician.     Follow Up Recommendations  No OT follow up;Supervision - Intermittent    Equipment Recommendations  3 in 1 bedside commode    Recommendations for Other Services       Precautions / Restrictions Precautions Precautions: Fall Precaution Comments: dyspnea on exertion, c/o some dizziness with standing Restrictions Weight Bearing Restrictions: No      Mobility Bed Mobility Overal bed mobility: Modified Independent             General bed mobility comments: PT at EOB upon arrival  Transfers Overall transfer level: Modified independent Equipment used: Rolling walker (2 wheeled)             General transfer comment: pt keeps both UEs on walker for sit<>stand    Balance Overall balance assessment: No apparent balance deficits (not formally assessed)                                         ADL either performed or assessed with clinical judgement   ADL Overall ADL's : Needs assistance/impaired                                       General ADL Comments: Performing ADLs and funcitonal mobility at supervision-Min Guard level. Pt performing toilet transfer and hygiene with supervision and grooming at sink with supervision. Pt SpO2  dropping to 98% during funcitonal mobility and 96% when adjusting her socks at EOB.      Vision         Perception     Praxis      Pertinent Vitals/Pain Pain Assessment: Faces Pain Score: 10-Worst pain ever Faces Pain Scale: Hurts a little bit Pain Location: BLEs Pain Descriptors / Indicators: Constant Pain Intervention(s): Monitored during session     Hand Dominance Right   Extremity/Trunk Assessment Upper Extremity Assessment Upper Extremity Assessment: Overall WFL for tasks assessed   Lower Extremity Assessment Lower Extremity Assessment: Defer to PT evaluation   Cervical / Trunk Assessment Cervical / Trunk Assessment: Normal   Communication Communication Communication: No difficulties   Cognition Arousal/Alertness: Awake/alert Behavior During Therapy: Anxious(Upset about family situation) Overall Cognitive Status: Within Functional Limits for tasks assessed                                     General Comments  Pt verbalizing frustration with PCA situation and feeling stress about it    Exercises     Shoulder Instructions      Home Living Family/patient expects to be discharged to:: Private residence Living  Arrangements: Alone Available Help at Discharge: Family;Personal care attendant;Available PRN/intermittently Type of Home: House Home Access: Stairs to enter Entergy CorporationEntrance Stairs-Number of Steps: 2   Home Layout: One level     Bathroom Shower/Tub: Tub/shower unit;Curtain   FirefighterBathroom Toilet: Standard     Home Equipment: Environmental consultantWalker - 2 wheels;Hand held Glass blower/designershower head(rollator)          Prior Functioning/Environment Level of Independence: Needs assistance  Gait / Transfers Assistance Needed: Uses rollator for functional mobility ADL's / Homemaking Assistance Needed: Aide assists with dressing and bathing as well as IADLs            OT Problem List: Decreased range of motion;Decreased activity tolerance      OT  Treatment/Interventions: Self-care/ADL training;Therapeutic exercise;Energy conservation;DME and/or AE instruction;Therapeutic activities;Patient/family education    OT Goals(Current goals can be found in the care plan section) Acute Rehab OT Goals Patient Stated Goal: to get home OT Goal Formulation: With patient Time For Goal Achievement: 07/05/17 Potential to Achieve Goals: Good ADL Goals Additional ADL Goal #1: Pt will verbalize three energy conservation techniques Additional ADL Goal #2: Pt will perform ADLs at Mod I level  OT Frequency: Min 2X/week   Barriers to D/C:            Co-evaluation              AM-PAC PT "6 Clicks" Daily Activity     Outcome Measure Help from another person eating meals?: None Help from another person taking care of personal grooming?: None Help from another person toileting, which includes using toliet, bedpan, or urinal?: A Little Help from another person bathing (including washing, rinsing, drying)?: A Little Help from another person to put on and taking off regular upper body clothing?: None Help from another person to put on and taking off regular lower body clothing?: A Little 6 Click Score: 21   End of Session Equipment Utilized During Treatment: Rolling walker Nurse Communication: Mobility status(pt EOB)  Activity Tolerance: Patient tolerated treatment well Patient left: in bed;with call bell/phone within reach  OT Visit Diagnosis: Unsteadiness on feet (R26.81);Other abnormalities of gait and mobility (R26.89);Muscle weakness (generalized) (M62.81);Pain Pain - Right/Left: (BIl) Pain - part of body: Leg                Time: [id number] OT Time Calculation (min): 15 min Charges:  OT General Charges $OT Visit: 1 Visit OT Evaluation $OT Eval Low Complexity: 1 Low G-Codes: OT G-codes **NOT FOR INPATIENT CLASS** Functional Assessment Tool Used: Clinical judgement Functional Limitation: Self care Self Care Current Status (V4098(G8987): At  least 1 percent but less than 20 percent impaired, limited or restricted Self Care Goal Status (J1914(G8988): 0 percent impaired, limited or restricted   Julie Kerr MSOT, OTR/L Acute Rehab Pager: [phone] Office: [phone]  Theodoro GristCharis M Reyann Kerr 06/21/2017, 4:49 PM

## 2017-06-22 LAB — CBC WITH DIFFERENTIAL/PLATELET
BASOS ABS: 0 10*3/uL (ref 0.0–0.1)
Basophils Relative: 0 %
EOS PCT: 0 %
Eosinophils Absolute: 0 10*3/uL (ref 0.0–0.7)
HCT: 35.3 % — ABNORMAL LOW (ref 36.0–46.0)
Hemoglobin: 11.1 g/dL — ABNORMAL LOW (ref 12.0–15.0)
LYMPHS ABS: 1.1 10*3/uL (ref 0.7–4.0)
Lymphocytes Relative: 4 %
MCH: 25.3 pg — ABNORMAL LOW (ref 26.0–34.0)
MCHC: 31.4 g/dL (ref 30.0–36.0)
MCV: 80.6 fL (ref 78.0–100.0)
MONO ABS: 0.8 10*3/uL (ref 0.1–1.0)
Monocytes Relative: 3 %
NEUTROS PCT: 93 %
Neutro Abs: 26.1 10*3/uL — ABNORMAL HIGH (ref 1.7–7.7)
PLATELETS: 320 10*3/uL (ref 150–400)
RBC: 4.38 MIL/uL (ref 3.87–5.11)
RDW: 15 % (ref 11.5–15.5)
WBC: 28 10*3/uL — AB (ref 4.0–10.5)

## 2017-06-22 LAB — HEMOGLOBIN A1C
Hgb A1c MFr Bld: 5.8 % — ABNORMAL HIGH (ref 4.8–5.6)
MEAN PLASMA GLUCOSE: 119.76 mg/dL

## 2017-06-22 LAB — COMPREHENSIVE METABOLIC PANEL
ALBUMIN: 3.3 g/dL — AB (ref 3.5–5.0)
ALT: 17 U/L (ref 14–54)
AST: 17 U/L (ref 15–41)
Alkaline Phosphatase: 55 U/L (ref 38–126)
Anion gap: 7 (ref 5–15)
BUN: 7 mg/dL (ref 6–20)
CO2: 22 mmol/L (ref 22–32)
CREATININE: 0.78 mg/dL (ref 0.44–1.00)
Calcium: 9.3 mg/dL (ref 8.9–10.3)
Chloride: 107 mmol/L (ref 101–111)
GFR calc Af Amer: >60 mL/min (ref >60)
Glucose, Bld: 178 mg/dL — ABNORMAL HIGH (ref 65–99)
POTASSIUM: 4.5 mmol/L (ref 3.5–5.1)
Sodium: 136 mmol/L (ref 135–145)
Total Bilirubin: 0.3 mg/dL (ref 0.3–1.2)
Total Protein: 6.8 g/dL (ref 6.5–8.1)

## 2017-06-22 LAB — C-REACTIVE PROTEIN: CRP: <0.8 mg/dL (ref <1.0)

## 2017-06-22 LAB — GLUCOSE, CAPILLARY
GLUCOSE-CAPILLARY: 207 mg/dL — AB (ref 65–99)
Glucose-Capillary: 222 mg/dL — ABNORMAL HIGH (ref 65–99)

## 2017-06-22 LAB — PHOSPHORUS: Phosphorus: 2.5 mg/dL (ref 2.5–4.6)

## 2017-06-22 LAB — SEDIMENTATION RATE: SED RATE: 17 mm/h (ref 0–22)

## 2017-06-22 LAB — MAGNESIUM: MAGNESIUM: 1.9 mg/dL (ref 1.7–2.4)

## 2017-06-22 MED ORDER — PREDNISONE 10 MG (21) PO TBPK: ORAL_TABLET | ORAL | 0 refills | Status: DC

## 2017-06-22 MED ORDER — PANTOPRAZOLE SODIUM 40 MG PO TBEC: 40.0000 mg | DELAYED_RELEASE_TABLET | Freq: Two times a day (BID) | ORAL | 0 refills | Status: DC

## 2017-06-22 MED ORDER — BENZONATATE 200 MG PO CAPS: 200.0000 mg | ORAL_CAPSULE | Freq: Three times a day (TID) | ORAL | 0 refills | Status: DC

## 2017-06-22 MED ORDER — ACETAMINOPHEN 325 MG PO TABS: 650.0000 mg | ORAL_TABLET | Freq: Four times a day (QID) | ORAL | 0 refills | Status: DC | PRN

## 2017-06-22 MED ORDER — LEVOFLOXACIN 750 MG PO TABS
750.0000 mg | ORAL_TABLET | Freq: Every day | ORAL | Status: DC
  Administered 2017-06-22: 750 mg via ORAL
  Filled 2017-06-22: qty 1

## 2017-06-22 MED ORDER — OXYMETAZOLINE HCL 0.05 % NA SOLN: 1.0000 | Freq: Two times a day (BID) | NASAL | 0 refills | Status: DC

## 2017-06-22 MED ORDER — GUAIFENESIN ER 600 MG PO TB12: 1200.0000 mg | ORAL_TABLET | Freq: Two times a day (BID) | ORAL | 0 refills | Status: DC

## 2017-06-22 MED ORDER — LEVOFLOXACIN 750 MG PO TABS: 750.0000 mg | ORAL_TABLET | Freq: Every day | ORAL | 0 refills | Status: DC

## 2017-06-22 NOTE — Progress Notes (Signed)
Tech offered Pt assistance with bath. Pt stated she is not ready for a bath. Tech will recheck later with Pt.

## 2017-06-22 NOTE — Care Management Note (Addendum)
Case Management Note  Patient Details  Name: Julie Kerr MRN: [id number] Date of Birth: 11/11/1975  Subjective/Objective:                    Action/Plan:  Patient will be discharging to sister's home : [address] , [place], [place] [address]  Cell phone [phone]   Patient needs PCP , [facility]. Called same no available appointments .   Patient would [facility] [phone], on [address] [place] . Called same , provided insurance information and was told on her insurance [facility] and would have to be seen there first and request her PCP to be changed.   [facility] [phone] to schedule appointment.  Patient has appointment tomorrow June 23, 2017 at [name] . Patient needs to check in on first floor . First available provider will see patient.   Patient states she is already has personal care services  with [facility] [phone] and would like [facility]PT with [facility] . [facility], [facility] [place] at [phone].   All of above explained to patient. Patient in agreement and voiced understanding.  Expected Discharge Date:  06/23/17               Expected Discharge Plan:  Home w Home Health Services  In-House Referral:     Discharge planning Services  CM Consult, Other - See comment, Follow-up appt scheduled  Post Acute Care Choice:  Durable Medical Equipment, Home Health, NA Choice offered to:  Patient  DME Arranged:  3-N-1, Nebulizer machine DME Agency:  [facility]  HH [facility]  [facility]  Status of Service:  Completed, signed off  If discussed at Long Length of Stay Meetings, dates discussed:    Additional Comments:  Kingsley PlanWile, Derell Bruun Marie, RN 06/22/2017, 2:13  PM

## 2017-06-22 NOTE — Progress Notes (Signed)
Discharged home with sister via wheelchair. Discharged instructions, personal belongings given to patient. Verbalized understanding of instructions. No further questions asked

## 2017-06-22 NOTE — Progress Notes (Signed)
Patient was observed and monitored sitting up on bed, O2 sat 99%. Then observed walking on the hallway via front wheel walker, back to the room O2sat recorded was 99%-100%. Wheezing noted but O2 sat remained in the 99-100%

## 2017-06-22 NOTE — Progress Notes (Signed)
Occupational Therapy Treatment Patient Details Name: Julie Kerr MRN: [id number] DOB: 01/08/1976 Today's Date: 06/22/2017    History of present illness pt is a 41 y/o female with PMH of vocal cord dysfunction, tracheostomy, HTN, and chronic pain, admitted with worsening dyspnea.    OT comments  Pt performed toileting and standing grooming with supervision for safety and use of RW. Educated in energy conservation strategies and reinforced with handout, pt verbalized understanding. Pt hopeful to go home today.  Follow Up Recommendations  No OT follow up;Supervision - Intermittent    Equipment Recommendations  3 in 1 bedside commode    Recommendations for Other Services      Precautions / Restrictions         Mobility Bed Mobility Overal bed mobility: Modified Independent                Transfers Overall transfer level: Modified independent Equipment used: Rolling walker (2 wheeled)                  Balance                                           ADL either performed or assessed with clinical judgement   ADL                                         General ADL Comments: Performed toileting and standing grooming with supervision for safety and since pt had just awakened. Educated in energy conservation strategies and gave handout to reinforce.     Vision       Perception     Praxis      Cognition Arousal/Alertness: Awake/alert Behavior During Therapy: WFL for tasks assessed/performed;Flat affect Overall Cognitive Status: Within Functional Limits for tasks assessed                                          Exercises     Shoulder Instructions       General Comments      Pertinent Vitals/ Pain       Pain Assessment: Faces Faces Pain Scale: No hurt  Home Living                                          Prior Functioning/Environment              Frequency  Min  2X/week        Progress Toward Goals  OT Goals(current goals can now be found in the care plan section)  Progress towards OT goals: Progressing toward goals  Acute Rehab OT Goals Patient Stated Goal: to get home OT Goal Formulation: With patient Time For Goal Achievement: 07/05/17 Potential to Achieve Goals: Good  Plan      Co-evaluation                 AM-PAC PT "6 Clicks" Daily Activity     Outcome Measure   Help from another person eating meals?: None Help from another person taking care of personal grooming?: A Little Help from another  person toileting, which includes using toliet, bedpan, or urinal?: A Little Help from another person bathing (including washing, rinsing, drying)?: A Little Help from another person to put on and taking off regular upper body clothing?: None Help from another person to put on and taking off regular lower body clothing?: A Little 6 Click Score: 20    End of Session Equipment Utilized During Treatment: Rolling walker  OT Visit Diagnosis: Other abnormalities of gait and mobility (R26.89)   Activity Tolerance Patient tolerated treatment well   Patient Left in bed(EOB eating breakfast)   Nurse Communication          Time: 1610-9604: 0820-0835 OT Time Calculation (min): 15 min  Charges: OT General Charges $OT Visit: 1 Visit OT Treatments $Self Care/Home Management : 8-22 mins  06/22/2017 Martie RoundJulie Trinita Devlin, OTR/L Pager: [phone] [place], [place] BailiffJulie [name] 06/22/2017, 8:40 AM

## 2017-06-22 NOTE — Discharge Summary (Signed)
Physician Discharge Summary  Julie Kerr SAY:[id number] DOB: [birth year] DOA: 06/20/2017  PCP: Patient, No Pcp Per  Admit date: 06/20/2017 Discharge date: 06/22/2017  Admitted From: Home Disposition:  Home with Home Health  Recommendations for Outpatient Follow-up:  1. Follow up with PCP in 1-2 weeks; Appointment scheduled for you in AM 2. Follow up with ENT as an outpatient 3. Establish with and follow up with Pulmonology as an outpatient  4. Please obtain CMP/CBC, Mag, Phos in one week 5. 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 Patient presented to the ER with progressive shortness of breath over 24 hours. In triage she was documented with audible wheezing, labored respiration and nasal flaring. It was not documented whether she was hypoxemic on room air or not. Patient reported to triage nurse she was out of home inhaler. Chest x-ray unremarkable. Patient was also complaining of right ear pain with tympanic membrane exam not consistent with otitis media. Patient reports nonproductive wet sounding cough but has not had fevers or chills or sick contacts. Labs were normal except for mild hypokalemia and patient did not have any leukocytosis. Patient has been given multiplealbuterol nebulizer treatments, IV magnesium, Solu-Medrol, Ativan and amoxicillin in the ER without improvement in symptoms.   Because of lack of improvement ABG was ordered but  patient refused this test unless she was given a dose of narcotic pain medication. She was admitted for Acute Dyspnea and found to be Hypoxic on Ambulation. She steadily improved and CTA showed no PE or sign of infection. She was started empirically on Levofloxacin for 5 days. She improved and PT evaluated and recommended Home Health PT. At this time she was deemed medically stable to D/C Home with [place] and has an appointment with the [facility] Department tomorrow.   Discharge Diagnoses:  Principal Problem:   Acute dyspnea Active Problems:   Upper airway cough syndrome, severe, with clinical VCD   Generalized anxiety disorder   Chronic asthmatic bronchitis (HCC)   Asthma   Leukocytosis   Anxiety and depression   Chronic narcotic dependence (HCC)   Hypertension   Vocal cord dysfunction   Obesity (BMI 30.0-34.9)   Right ear pain  Acute Respiratory failure with Hypoxia with unknown Etiology in the setting of Vocal Cord Dysfunction, improved -Patient presented with acute dyspnea without hypoxemia, normal chest x-ray, and clinical exam consistent with upper airway wheezing -Patient was not hypoxic on admission but was hypoxic on Ambulation yesterday. Not hypoxic today on Ambulation  -Check CTA w/wo Contrast to r/o PE No pulmonary embolism seen, with mild study limitations. Overall, no acute findings. Heart size is normal. No pericardial effusion. No aortic aneurysm. Lungs are clear. -RSV Panel and Influenza Negative -C/w Home Nebulizers  -Given Amoxicillin and Doxycycline on Admission; Started po Levofloxacin for URI -C/w Benzonatate 200 mg po TID and add Guaifenesin 1,200 mg BID -Given IV Mag Sulfate yesterday  -C/w Solumedrol 60 mg IV q6h and started Steroid Taper at D/C  -C/w Pantoprazole 40 mg po BID -C/w Continuous Pulse Oximetry and Maintain  O2 Saturations >92% -C/w Incentive Spirometry -PT Evaluation showed that patient needs to have Home  Health  Leukocytosis -Likely 2/2 to IV Steroid Demargination -Patient's WBC went from 8.1 -> 18.0 -> 28.0 -Continue to Monitor for S/Sx of Infection; Empirically started Levofloxacin but doubt infection causing elevated WBC -Repeat CBC in AM  Chronic Narcotic dependence/history of polysubstance abuse/Generalized anxiety disorder -Patient reports has chronic low back and hip pain;self-reports pain clinic in [facility] previously prescribed her oxycodone and Percocet but it is unclear if she has an active prescription available at home -When further questionedregarding the pain clinic,patient reports that she had too many visits where she did not show up for an appointment and/or was unable to pay and therefore is no longer active with this pain clinic. -It has been documented during previous encounters that patient has demonstrated drug-seeking behaviors and yesterday she refused an ABG unless she was given pain medication -Old medication reconciliation listsprnXanax as medication as well but this has not been confirmed as being a current active prescription.During previous admission patient apparently had been on Lyrica and Seroquel but uncertain if she still takes these medications to treat her anxiety disorder and panic attacks. -Since it is unclear as to whether she has active prescription for narcotics and therefore has been taking regularly we will allow for Oxycodone 5 mg every 6 hoursprnto prevent potential narcotic withdrawal. -We will not prescribe narcotics or benzodiazepines at time of discharge.The patient was informed of this policy at time of admission. -UDS was positive for Opiates  -Given 1 mg IV Lorazepam x 2 yesterday and given 1 mg IV Morpine -C/w Meloxicam 7.5 mg po Daily  -Follow up with PCP   Headache and Right Ear Pain -Patient reports significant right ear pain with exam not consistent with acute otitis media; Complains of some blurred vision  and Headache  -Symptom management  -Tylenol and/or preadmission Mobic for mild pain -Intranasal Oxymetazoline 1 spray Each Nare BID -Continue preadmission Flonase -Checked ESR and CRP and they were negative -Check Head CT w/o Contrast was normal -Follow up with ENT for evaluation as an outpatient   Hypertension -C/w Home Verapamil 120 mg po qHS  Obesity (BMI 30.0-34.9) -Weight Loss Counseling given   Hyperglycemia -In the Setting of IV Steroid Use -Checked HbA1c and was 5.8 -Started Sensitive Novolog SSI AC/HS -Follo up with PCP in AM   Hyponatremia, improved -Mild. Na+ was 134 and improved to 136 -Repeat CMP in AM   Discharge Instructions  Discharge Instructions    Diet - low sodium heart healthy   Complete by:  As directed    Increase activity slowly   Complete by:  As directed      Allergies as of 06/22/2017      Reactions   Robitussin Dm [dextromethorphan-guaifenesin] Nausea And Vomiting   Chocolate Hives   Suboxone [buprenorphine Hcl-naloxone Hcl] Other (See Comments)   Aggressive behavior   Nsaids Hives   Rayon, Purified Hives   Tolmetin Hives   Tramadol Hives, Itching      Medication List    STOP taking these medications   ADVAIR DISKUS 100-50 MCG/DOSE Aepb Generic drug:  Fluticasone-Salmeterol   budesonide-formoterol 80-4.5 MCG/ACT inhaler Commonly known as:  SYMBICORT     TAKE these medications   acetaminophen 325 MG tablet Commonly known as:  TYLENOL Take 2 tablets (650 mg total) every 6 (six) hours as needed by mouth for mild pain (or Fever >/= 101).   ACTIVASE 1 mg/mL injection Generic drug:  alteplase 2 mg by Intracatheter route once as needed.   albuterol 108 (90 Base) MCG/ACT inhaler Commonly known as:  PROVENTIL HFA;VENTOLIN HFA Inhale 2 puffs into the lungs every 6 (six) hours as needed for wheezing or shortness of breath.   ALPRAZolam 0.5 MG tablet Commonly known as:  XANAX Take 1 tablet (0.5 mg total) by mouth 3 (three)  times daily as needed for anxiety. What changed:  how much to take   benzonatate 200 MG capsule Commonly known as:  TESSALON Take 1 capsule (200 mg total) 3 (three) times daily by mouth.   fluticasone 50 MCG/ACT nasal spray Commonly known as:  FLONASE Place 2 sprays into both nostrils 2 (two) times daily.   fluticasone furoate-vilanterol 200-25 MCG/INH Aepb Commonly known as:  BREO ELLIPTA Inhale 1 puff into the lungs daily.   gabapentin 300 MG capsule Commonly known as:  NEURONTIN Take 2 capsules (600 mg total) by mouth 2 (two) times daily. What changed:    how much to take  when to take this   guaiFENesin 600 MG 12 hr tablet Commonly known as:  MUCINEX Take 2 tablets (1,200 mg total) 2 (two) times daily by mouth.   ipratropium-albuterol 0.5-2.5 (3) MG/3ML Soln Commonly known as:  DUONEB Take 3 mLs by nebulization every 4 (four) hours as needed (shortness of breath/wheezing).   levofloxacin 750 MG tablet Commonly known as:  LEVAQUIN Take 1 tablet (750 mg total) daily by mouth. Start taking on:  06/23/2017   meloxicam 7.5 MG tablet Commonly known as:  MOBIC Take 7.5 mg daily by mouth.   methocarbamol 500 MG tablet Commonly known as:  ROBAXIN Take 2 tablets (1,000 mg total) by mouth 2 (two) times daily.   morphine 30 MG tablet Commonly known as:  MSIR Take 30 mg by mouth every 12 (twelve) hours.   oxyCODONE 15 MG immediate release tablet Commonly known as:  ROXICODONE Take 15 mg by mouth every 4 (four) hours as needed for pain.   oxymetazoline 0.05 % nasal spray Commonly known as:  AFRIN Place 1 spray 2 (two) times daily into both nostrils.   pantoprazole 40 MG tablet Commonly known as:  PROTONIX Take 1 tablet (40 mg total) 2 (two) times daily by mouth. What changed:  when to take this   predniSONE 10 MG (21) Tbpk tablet Commonly known as:  STERAPRED UNI-PAK 21 TAB Take 6 Tablets Day 1, 5 Tablets Day 2, 4 Tablets Day 2, 3 Tablets Day 4, 2 Tablets Day 5, 1  Tablet Day 6, and Stop Day 7   verapamil 100 MG 24 hr capsule Commonly known as:  VERELAN Take 100 mg by mouth at bedtime.   zolpidem 10 MG tablet Commonly known as:  AMBIEN Take 10 mg by mouth at bedtime. Reported on 12/03/2015            Durable Medical Equipment  (From admission, onward)        Start     Ordered   06/22/17 1338  For home use only DME 3 n 1  Once     06/22/17 1338   06/22/17 1338  For home use only DME Nebulizer/meds  Once    Question:  Patient needs a nebulizer to treat with the following condition  Answer:  Acute respiratory failure [facility])   06/22/17 1338     Follow-up Information    Department, [facility]. Go to.   Why:  June 23, 2017 at 0900 check in on first floor  Contact information: [place] [address] [phone]        [facility] Follow up.   Specialty:  [place] Why:  Will provide Home Health PT Contact information: [address] [place] [address] [phone]          Allergies  Allergen Reactions  . Robitussin Dm [Dextromethorphan-Guaifenesin] Nausea And Vomiting  . Chocolate Hives  . Suboxone [Buprenorphine Hcl-Naloxone Hcl] Other (See Comments)    Aggressive behavior  . Nsaids Hives  . Rayon, Purified Hives  . Tolmetin Hives  . Tramadol Hives and Itching   Consultations:  None  Procedures/Studies: Dg Chest 2 View  Result Date: 06/20/2017 CLINICAL DATA:  Shortness of breath and wheezing for 2 days EXAM: CHEST  2 VIEW COMPARISON:  04/10/2017 FINDINGS: Cardiac shadow is stable at the upper limits of normal in size. The lungs are well aerated bilaterally. No focal infiltrate or sizable effusion is seen. No acute bony abnormality is noted. IMPRESSION: No active cardiopulmonary disease. Electronically Signed   By: Inez Catalina M.D.   On: 06/20/2017 13:21   Ct Head Wo Contrast  Result Date: 06/21/2017 CLINICAL DATA:  Headache, shortness of breath and  chest tightness for 2 days. EXAM: CT HEAD WITHOUT CONTRAST TECHNIQUE: Contiguous axial images were obtained from the base of the skull through the vertex without intravenous contrast. COMPARISON:  None. FINDINGS: Brain: Ventricles are normal in size and configuration. All areas of the brain demonstrate normal gray-white matter differentiation. There is no mass, hemorrhage, edema or other evidence of acute parenchymal abnormality. No extra-axial hemorrhage. Vascular: No hyperdense vessel or unexpected calcification. Skull: Normal. Negative for fracture or focal lesion. Sinuses/Orbits: No acute finding. Other: None. IMPRESSION: Normal head CT. Electronically Signed   By: Franki Cabot M.D.   On: 06/21/2017 14:13   Ct Angio Chest Pe W Or Wo Contrast  Result Date: 06/21/2017 CLINICAL DATA:  Headache, shortness of breath and chest tightness for 2 days. EXAM: CT ANGIOGRAPHY CHEST WITH CONTRAST TECHNIQUE: Multidetector CT imaging of the chest was performed using the standard protocol during bolus administration of intravenous contrast. Multiplanar CT image reconstructions and MIPs were obtained to evaluate the vascular anatomy. CONTRAST:  100 cc Isovue 370 COMPARISON:  None. FINDINGS: Cardiovascular: There is no pulmonary embolism identified within the main, lobar or segmental pulmonary arteries bilaterally, although some of the most peripheral segmental and subsegmental pulmonary arteries are difficult to definitively characterize due to mild patient breathing motion artifact. Thoracic aorta is normal in caliber and configuration. Heart size is upper normal. No pericardial effusion. Mediastinum/Nodes: Esophagus is unremarkable. No mass or enlarged lymph nodes seen within the mediastinum or perihilar regions. Trachea and central bronchi are unremarkable. Lungs/Pleura: Lungs are clear.  No pleural effusion or pneumothorax. Upper Abdomen: Limited images of the upper abdomen are unremarkable. No acute findings.  Musculoskeletal: No acute or suspicious osseous finding. Review of the MIP images confirms the above findings. IMPRESSION: 1. No pulmonary embolism seen, with mild study limitations detailed above. 2. Overall, no acute findings. Heart size is normal. No pericardial effusion. No aortic aneurysm. Lungs are clear. Electronically Signed   By: Franki Cabot M.D.   On: 06/21/2017 14:11     Subjective: Seen and examined at bedside and felt a lot better. No CP or SOB. Able to ambulate today without desaturating but started wheezing slightly. No other concerns or complaints at this time.  Discharge Exam: Vitals:   06/22/17 1229 06/22/17 1358  BP:  Marland Kitchen)  120/56  Pulse:  (!) 103  Resp:  16  Temp:  98.1 F (36.7 C)  SpO2: 99%    Vitals:   06/22/17 0505 06/22/17 0842 06/22/17 1229 06/22/17 1358  BP: 123/62   (!) 120/56  Pulse: 82   (!) 103  Resp: 16   16  Temp: 98.8 F (37.1 C)   98.1 F (36.7 C)  TempSrc: Oral   Oral  SpO2: 99% 98% 99%   Weight:      Height:       General: Pt is alert, awake, not in acute distress Cardiovascular: RRR, S1/S2 +, no rubs, no gallops Respiratory: Diminsihed bilaterally, mild upper airway wheezing, no rhonchi, Patient was not tachypenic or using any accessory muscles to breathe Abdominal: Soft, NT, Distended due to body habitus, bowel sounds + Extremities: no edema, no cyanosis  The results of significant diagnostics from this hospitalization (including imaging, microbiology, ancillary and laboratory) are listed below for reference.    Microbiology: Recent Results (from the past 240 hour(s))  Respiratory Panel by PCR     Status: None   Collection Time: 06/20/17  4:26 PM  Result Value Ref Range Status   Adenovirus NOT DETECTED NOT DETECTED Final   Coronavirus 229E NOT DETECTED NOT DETECTED Final   Coronavirus HKU1 NOT DETECTED NOT DETECTED Final   Coronavirus NL63 NOT DETECTED NOT DETECTED Final   Coronavirus OC43 NOT DETECTED NOT DETECTED Final    Metapneumovirus NOT DETECTED NOT DETECTED Final   Rhinovirus / Enterovirus NOT DETECTED NOT DETECTED Final   Influenza A NOT DETECTED NOT DETECTED Final   Influenza B NOT DETECTED NOT DETECTED Final   Parainfluenza Virus 1 NOT DETECTED NOT DETECTED Final   Parainfluenza Virus 2 NOT DETECTED NOT DETECTED Final   Parainfluenza Virus 3 NOT DETECTED NOT DETECTED Final   Parainfluenza Virus 4 NOT DETECTED NOT DETECTED Final   Respiratory Syncytial Virus NOT DETECTED NOT DETECTED Final   Bordetella pertussis NOT DETECTED NOT DETECTED Final   Chlamydophila pneumoniae NOT DETECTED NOT DETECTED Final   Mycoplasma pneumoniae NOT DETECTED NOT DETECTED Final  MRSA PCR Screening     Status: None   Collection Time: 06/21/17 10:17 AM  Result Value Ref Range Status   MRSA by PCR NEGATIVE NEGATIVE Final    Comment:        The GeneXpert MRSA Assay (FDA approved for NASAL specimens only), is one component of a comprehensive MRSA colonization surveillance program. It is not intended to diagnose MRSA infection nor to guide or monitor treatment for MRSA infections.     Labs: BNP (last 3 results) No results for input(s): BNP in the last 8760 hours. Basic Metabolic Panel: Recent Labs  Lab 06/20/17 1455 06/20/17 1726 06/21/17 0343 06/22/17 0450  NA 137  --  134* 136  K 3.4*  --  4.7 4.5  CL 105  --  109 107  CO2 26  --  18* 22  GLUCOSE 103*  --  219* 178*  BUN 8  --  9 7  CREATININE 0.80  --  0.84 0.78  CALCIUM 9.1  --  9.3 9.3  MG  --  2.1  --  1.9  PHOS  --   --   --  2.5   Liver Function Tests: Recent Labs  Lab 06/21/17 0343 06/22/17 0450  AST 19 17  ALT 16 17  ALKPHOS 59 55  BILITOT 0.2* 0.3  PROT 6.5 6.8  ALBUMIN 3.4* 3.3*   No results for  input(s): LIPASE, AMYLASE in the last 168 hours. No results for input(s): AMMONIA in the last 168 hours. CBC: Recent Labs  Lab 06/20/17 1455 06/21/17 0343 06/22/17 0450  WBC 8.1 18.0* 28.0*  NEUTROABS 5.7  --  26.1*  HGB 11.1*  11.3* 11.1*  HCT 35.4* 36.0 35.3*  MCV 79.7 80.4 80.6  PLT 310 324 320   Cardiac Enzymes: No results for input(s): CKTOTAL, CKMB, CKMBINDEX, TROPONINI in the last 168 hours. BNP: Invalid input(s): POCBNP CBG: Recent Labs  Lab 06/21/17 1752 06/21/17 2006 06/22/17 0733 06/22/17 1159  GLUCAP 164* 244* 222* 207*   D-Dimer No results for input(s): DDIMER in the last 72 hours. Hgb A1c Recent Labs    06/22/17 0450  HGBA1C 5.8*   Lipid Profile No results for input(s): CHOL, HDL, LDLCALC, TRIG, CHOLHDL, LDLDIRECT in the last 72 hours. Thyroid function studies No results for input(s): TSH, T4TOTAL, T3FREE, THYROIDAB in the last 72 hours.  Invalid input(s): FREET3 Anemia work up No results for input(s): VITAMINB12, FOLATE, FERRITIN, TIBC, IRON, RETICCTPCT in the last 72 hours. Urinalysis    Component Value Date/Time   COLORURINE YELLOW 08/28/2015 1714   APPEARANCEUR CLEAR 08/28/2015 1714   LABSPEC 1.019 08/28/2015 1714   PHURINE 6.5 08/28/2015 1714   GLUCOSEU >1000 (A) 08/28/2015 1714   HGBUR NEGATIVE 08/28/2015 1714   BILIRUBINUR NEGATIVE 08/28/2015 1714   KETONESUR 15 (A) 08/28/2015 1714   PROTEINUR NEGATIVE 08/28/2015 1714   UROBILINOGEN 0.2 03/25/2015 1526   NITRITE NEGATIVE 08/28/2015 1714   LEUKOCYTESUR NEGATIVE 08/28/2015 1714   Sepsis Labs Invalid input(s): PROCALCITONIN,  WBC,  LACTICIDVEN Microbiology Recent Results (from the past 240 hour(s))  Respiratory Panel by PCR     Status: None   Collection Time: 06/20/17  4:26 PM  Result Value Ref Range Status   Adenovirus NOT DETECTED NOT DETECTED Final   Coronavirus 229E NOT DETECTED NOT DETECTED Final   Coronavirus HKU1 NOT DETECTED NOT DETECTED Final   Coronavirus NL63 NOT DETECTED NOT DETECTED Final   Coronavirus OC43 NOT DETECTED NOT DETECTED Final   Metapneumovirus NOT DETECTED NOT DETECTED Final   Rhinovirus / Enterovirus NOT DETECTED NOT DETECTED Final   Influenza A NOT DETECTED NOT DETECTED Final    Influenza B NOT DETECTED NOT DETECTED Final   Parainfluenza Virus 1 NOT DETECTED NOT DETECTED Final   Parainfluenza Virus 2 NOT DETECTED NOT DETECTED Final   Parainfluenza Virus 3 NOT DETECTED NOT DETECTED Final   Parainfluenza Virus 4 NOT DETECTED NOT DETECTED Final   Respiratory Syncytial Virus NOT DETECTED NOT DETECTED Final   Bordetella pertussis NOT DETECTED NOT DETECTED Final   Chlamydophila pneumoniae NOT DETECTED NOT DETECTED Final   Mycoplasma pneumoniae NOT DETECTED NOT DETECTED Final  MRSA PCR Screening     Status: None   Collection Time: 06/21/17 10:17 AM  Result Value Ref Range Status   MRSA by PCR NEGATIVE NEGATIVE Final    Comment:        The GeneXpert MRSA Assay (FDA approved for NASAL specimens only), is one component of a comprehensive MRSA colonization surveillance program. It is not intended to diagnose MRSA infection nor to guide or monitor treatment for MRSA infections.    Time coordinating discharge: 35 minutes  SIGNED:  Kerney Elbe, DO [facility] 06/22/2017, 3:09 PM Pager [phone]  If 7PM-7AM, please contact night-coverage [email or web address] Password [email or web address]

## 2017-07-03 DIAGNOSIS — M5441 Lumbago with sciatica, right side: Secondary | ICD-10-CM | POA: Diagnosis not present

## 2017-07-03 DIAGNOSIS — G894 Chronic pain syndrome: Secondary | ICD-10-CM | POA: Diagnosis not present

## 2017-07-03 DIAGNOSIS — M5442 Lumbago with sciatica, left side: Secondary | ICD-10-CM | POA: Diagnosis not present

## 2017-07-03 DIAGNOSIS — M533 Sacrococcygeal disorders, not elsewhere classified: Secondary | ICD-10-CM | POA: Diagnosis not present

## 2017-08-15 DIAGNOSIS — G8929 Other chronic pain: Secondary | ICD-10-CM | POA: Diagnosis present

## 2017-08-15 DIAGNOSIS — J9801 Acute bronchospasm: Secondary | ICD-10-CM | POA: Diagnosis not present

## 2017-08-15 DIAGNOSIS — Z7722 Contact with and (suspected) exposure to environmental tobacco smoke (acute) (chronic): Secondary | ICD-10-CM | POA: Diagnosis present

## 2017-08-15 DIAGNOSIS — R0603 Acute respiratory distress: Secondary | ICD-10-CM | POA: Diagnosis not present

## 2017-08-15 DIAGNOSIS — R0602 Shortness of breath: Secondary | ICD-10-CM | POA: Diagnosis not present

## 2017-08-15 DIAGNOSIS — G4730 Sleep apnea, unspecified: Secondary | ICD-10-CM | POA: Diagnosis not present

## 2017-08-15 DIAGNOSIS — Z23 Encounter for immunization: Secondary | ICD-10-CM | POA: Diagnosis not present

## 2017-08-15 DIAGNOSIS — F419 Anxiety disorder, unspecified: Secondary | ICD-10-CM | POA: Diagnosis not present

## 2017-08-15 DIAGNOSIS — K219 Gastro-esophageal reflux disease without esophagitis: Secondary | ICD-10-CM | POA: Diagnosis present

## 2017-08-15 DIAGNOSIS — J4541 Moderate persistent asthma with (acute) exacerbation: Secondary | ICD-10-CM | POA: Diagnosis not present

## 2017-08-15 DIAGNOSIS — I10 Essential (primary) hypertension: Secondary | ICD-10-CM | POA: Diagnosis present

## 2017-08-15 DIAGNOSIS — J439 Emphysema, unspecified: Secondary | ICD-10-CM | POA: Diagnosis not present

## 2017-09-11 DIAGNOSIS — Z79891 Long term (current) use of opiate analgesic: Secondary | ICD-10-CM | POA: Diagnosis not present

## 2017-09-11 DIAGNOSIS — M961 Postlaminectomy syndrome, not elsewhere classified: Secondary | ICD-10-CM | POA: Diagnosis not present

## 2017-09-11 DIAGNOSIS — M79604 Pain in right leg: Secondary | ICD-10-CM | POA: Diagnosis not present

## 2017-09-11 DIAGNOSIS — M545 Low back pain: Secondary | ICD-10-CM | POA: Diagnosis not present

## 2017-09-11 DIAGNOSIS — G894 Chronic pain syndrome: Secondary | ICD-10-CM | POA: Diagnosis not present

## 2017-09-11 DIAGNOSIS — Z79899 Other long term (current) drug therapy: Secondary | ICD-10-CM | POA: Diagnosis not present

## 2017-10-17 DIAGNOSIS — J45901 Unspecified asthma with (acute) exacerbation: Secondary | ICD-10-CM | POA: Diagnosis not present

## 2017-10-17 DIAGNOSIS — J069 Acute upper respiratory infection, unspecified: Secondary | ICD-10-CM | POA: Diagnosis not present

## 2017-10-17 DIAGNOSIS — S39012A Strain of muscle, fascia and tendon of lower back, initial encounter: Secondary | ICD-10-CM | POA: Diagnosis not present

## 2018-01-03 ENCOUNTER — Emergency Department (HOSPITAL_COMMUNITY)
Admission: EM | Admit: 2018-01-03 | Discharge: 2018-01-03 | Disposition: A | Discharge status: Home / Self Care | Payer: 59 | Attending: Emergency Medicine | Admitting: Emergency Medicine

## 2018-01-03 ENCOUNTER — Encounter (HOSPITAL_COMMUNITY): Payer: Self-pay

## 2018-01-03 ENCOUNTER — Other Ambulatory Visit: Payer: Self-pay

## 2018-01-03 DIAGNOSIS — R0602 Shortness of breath: Secondary | ICD-10-CM | POA: Insufficient documentation

## 2018-01-03 DIAGNOSIS — Z5321 Procedure and treatment not carried out due to patient leaving prior to being seen by health care provider: Secondary | ICD-10-CM | POA: Insufficient documentation

## 2018-01-03 MED ORDER — ALBUTEROL SULFATE (2.5 MG/3ML) 0.083% IN NEBU
5.0000 mg | INHALATION_SOLUTION | Freq: Once | RESPIRATORY_TRACT | Status: AC
  Administered 2018-01-03: 5 mg via RESPIRATORY_TRACT
  Filled 2018-01-03: qty 6

## 2018-01-03 NOTE — ED Triage Notes (Signed)
Pt endorses asthma exacerbation while in [place] for a dr's appointment, pt lives 1.5 hours away and forgot her inhaler. VSS.

## 2018-01-03 NOTE — ED Notes (Signed)
Pt informed this Rn that she was leaving because she felt good after breathing treatment. Pt encouraged to stay and informed that she was leaving AMA and she understood.

## 2018-02-03 ENCOUNTER — Other Ambulatory Visit: Payer: Self-pay | Admitting: Critical Care Medicine

## 2019-03-09 ENCOUNTER — Other Ambulatory Visit: Payer: Self-pay

## 2019-03-09 ENCOUNTER — Emergency Department (HOSPITAL_COMMUNITY)
Admission: EM | Admit: 2019-03-09 | Discharge: 2019-03-10 | Disposition: A | Discharge status: Home / Self Care | Payer: 59 | Attending: Emergency Medicine | Admitting: Emergency Medicine

## 2019-03-09 ENCOUNTER — Emergency Department (HOSPITAL_COMMUNITY): Payer: 59

## 2019-03-09 ENCOUNTER — Encounter (HOSPITAL_COMMUNITY): Payer: Self-pay | Admitting: Oncology

## 2019-03-09 DIAGNOSIS — R062 Wheezing: Secondary | ICD-10-CM | POA: Insufficient documentation

## 2019-03-09 DIAGNOSIS — M79604 Pain in right leg: Secondary | ICD-10-CM | POA: Diagnosis not present

## 2019-03-09 DIAGNOSIS — M542 Cervicalgia: Secondary | ICD-10-CM | POA: Insufficient documentation

## 2019-03-09 DIAGNOSIS — M545 Low back pain: Secondary | ICD-10-CM | POA: Diagnosis not present

## 2019-03-09 DIAGNOSIS — R51 Headache: Secondary | ICD-10-CM | POA: Diagnosis not present

## 2019-03-09 DIAGNOSIS — Z79899 Other long term (current) drug therapy: Secondary | ICD-10-CM | POA: Insufficient documentation

## 2019-03-09 DIAGNOSIS — I10 Essential (primary) hypertension: Secondary | ICD-10-CM | POA: Insufficient documentation

## 2019-03-09 DIAGNOSIS — M79605 Pain in left leg: Secondary | ICD-10-CM | POA: Diagnosis not present

## 2019-03-09 DIAGNOSIS — J449 Chronic obstructive pulmonary disease, unspecified: Secondary | ICD-10-CM | POA: Diagnosis not present

## 2019-03-09 DIAGNOSIS — Z87891 Personal history of nicotine dependence: Secondary | ICD-10-CM | POA: Diagnosis not present

## 2019-03-09 MED ORDER — ALBUTEROL SULFATE HFA 108 (90 BASE) MCG/ACT IN AERS
2.0000 | INHALATION_SPRAY | Freq: Once | RESPIRATORY_TRACT | Status: AC
  Administered 2019-03-09: 2 via RESPIRATORY_TRACT
  Filled 2019-03-09: qty 6.7

## 2019-03-09 MED ORDER — OXYCODONE-ACETAMINOPHEN 5-325 MG PO TABS
1.0000 | ORAL_TABLET | Freq: Once | ORAL | Status: AC
  Administered 2019-03-09: 1 via ORAL
  Filled 2019-03-09: qty 1

## 2019-03-09 MED ORDER — METHOCARBAMOL 500 MG PO TABS: 500.0000 mg | ORAL_TABLET | Freq: Two times a day (BID) | ORAL | 0 refills | Status: DC

## 2019-03-09 MED ORDER — FENTANYL CITRATE (PF) 100 MCG/2ML IJ SOLN
25.0000 ug | Freq: Once | INTRAMUSCULAR | Status: AC
  Administered 2019-03-09: 25 ug via INTRAVENOUS
  Filled 2019-03-09: qty 2

## 2019-03-09 MED ORDER — METHYLPREDNISOLONE SODIUM SUCC 125 MG IJ SOLR
125.0000 mg | Freq: Once | INTRAMUSCULAR | Status: AC
  Administered 2019-03-09: 125 mg via INTRAVENOUS
  Filled 2019-03-09: qty 2

## 2019-03-09 MED ORDER — METHOCARBAMOL 500 MG PO TABS
500.0000 mg | ORAL_TABLET | Freq: Once | ORAL | Status: AC
  Administered 2019-03-09: 500 mg via ORAL
  Filled 2019-03-09: qty 1

## 2019-03-09 NOTE — ED Notes (Signed)
Pt c/o neck and back pain (10/10) and asks for some pain meds.
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
## 2019-03-09 NOTE — ED Notes (Signed)
Julie Kerr Daughter [phone]
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
## 2019-03-10 NOTE — ED Notes (Signed)
Pt d/c'd by Gretta Cool, CN.

## 2020-02-02 ENCOUNTER — Emergency Department (HOSPITAL_COMMUNITY): Payer: 59

## 2020-02-02 ENCOUNTER — Inpatient Hospital Stay (HOSPITAL_COMMUNITY)
Admission: AD | Admit: 2020-02-02 | Discharge: 2020-02-06 | DRG: 154 | Disposition: A | Discharge status: Home / Self Care | Payer: 59 | Attending: Internal Medicine | Admitting: Internal Medicine

## 2020-02-02 ENCOUNTER — Encounter (HOSPITAL_COMMUNITY): Payer: Self-pay

## 2020-02-02 ENCOUNTER — Other Ambulatory Visit: Payer: Self-pay

## 2020-02-02 DIAGNOSIS — J9601 Acute respiratory failure with hypoxia: Secondary | ICD-10-CM | POA: Diagnosis present

## 2020-02-02 DIAGNOSIS — Z823 Family history of stroke: Secondary | ICD-10-CM | POA: Diagnosis not present

## 2020-02-02 DIAGNOSIS — Z915 Personal history of self-harm: Secondary | ICD-10-CM

## 2020-02-02 DIAGNOSIS — Z6830 Body mass index (BMI) 30.0-30.9, adult: Secondary | ICD-10-CM

## 2020-02-02 DIAGNOSIS — Z825 Family history of asthma and other chronic lower respiratory diseases: Secondary | ICD-10-CM | POA: Diagnosis not present

## 2020-02-02 DIAGNOSIS — J383 Other diseases of vocal cords: Principal | ICD-10-CM | POA: Diagnosis present

## 2020-02-02 DIAGNOSIS — Z20822 Contact with and (suspected) exposure to covid-19: Secondary | ICD-10-CM | POA: Diagnosis present

## 2020-02-02 DIAGNOSIS — D509 Iron deficiency anemia, unspecified: Secondary | ICD-10-CM | POA: Diagnosis present

## 2020-02-02 DIAGNOSIS — R739 Hyperglycemia, unspecified: Secondary | ICD-10-CM | POA: Diagnosis present

## 2020-02-02 DIAGNOSIS — Z885 Allergy status to narcotic agent status: Secondary | ICD-10-CM | POA: Diagnosis not present

## 2020-02-02 DIAGNOSIS — K219 Gastro-esophageal reflux disease without esophagitis: Secondary | ICD-10-CM | POA: Diagnosis present

## 2020-02-02 DIAGNOSIS — Z981 Arthrodesis status: Secondary | ICD-10-CM

## 2020-02-02 DIAGNOSIS — F17210 Nicotine dependence, cigarettes, uncomplicated: Secondary | ICD-10-CM | POA: Diagnosis present

## 2020-02-02 DIAGNOSIS — Z801 Family history of malignant neoplasm of trachea, bronchus and lung: Secondary | ICD-10-CM

## 2020-02-02 DIAGNOSIS — J4551 Severe persistent asthma with (acute) exacerbation: Secondary | ICD-10-CM

## 2020-02-02 DIAGNOSIS — F410 Panic disorder [episodic paroxysmal anxiety] without agoraphobia: Secondary | ICD-10-CM | POA: Diagnosis present

## 2020-02-02 DIAGNOSIS — M549 Dorsalgia, unspecified: Secondary | ICD-10-CM | POA: Diagnosis present

## 2020-02-02 DIAGNOSIS — G894 Chronic pain syndrome: Secondary | ICD-10-CM | POA: Diagnosis present

## 2020-02-02 DIAGNOSIS — J45901 Unspecified asthma with (acute) exacerbation: Secondary | ICD-10-CM | POA: Diagnosis present

## 2020-02-02 DIAGNOSIS — Z832 Family history of diseases of the blood and blood-forming organs and certain disorders involving the immune mechanism: Secondary | ICD-10-CM

## 2020-02-02 DIAGNOSIS — Z791 Long term (current) use of non-steroidal anti-inflammatories (NSAID): Secondary | ICD-10-CM

## 2020-02-02 DIAGNOSIS — X58XXXA Exposure to other specified factors, initial encounter: Secondary | ICD-10-CM | POA: Diagnosis present

## 2020-02-02 DIAGNOSIS — R0902 Hypoxemia: Secondary | ICD-10-CM

## 2020-02-02 DIAGNOSIS — T380X5A Adverse effect of glucocorticoids and synthetic analogues, initial encounter: Secondary | ICD-10-CM | POA: Diagnosis not present

## 2020-02-02 DIAGNOSIS — Z91018 Allergy to other foods: Secondary | ICD-10-CM | POA: Diagnosis not present

## 2020-02-02 DIAGNOSIS — R042 Hemoptysis: Secondary | ICD-10-CM | POA: Diagnosis not present

## 2020-02-02 DIAGNOSIS — J449 Chronic obstructive pulmonary disease, unspecified: Secondary | ICD-10-CM | POA: Diagnosis present

## 2020-02-02 DIAGNOSIS — Z8249 Family history of ischemic heart disease and other diseases of the circulatory system: Secondary | ICD-10-CM | POA: Diagnosis not present

## 2020-02-02 DIAGNOSIS — D72828 Other elevated white blood cell count: Secondary | ICD-10-CM | POA: Diagnosis present

## 2020-02-02 DIAGNOSIS — Z79899 Other long term (current) drug therapy: Secondary | ICD-10-CM

## 2020-02-02 DIAGNOSIS — E669 Obesity, unspecified: Secondary | ICD-10-CM | POA: Diagnosis present

## 2020-02-02 DIAGNOSIS — Z888 Allergy status to other drugs, medicaments and biological substances status: Secondary | ICD-10-CM | POA: Diagnosis not present

## 2020-02-02 DIAGNOSIS — F4310 Post-traumatic stress disorder, unspecified: Secondary | ICD-10-CM | POA: Diagnosis present

## 2020-02-02 DIAGNOSIS — Z62810 Personal history of physical and sexual abuse in childhood: Secondary | ICD-10-CM | POA: Diagnosis present

## 2020-02-02 DIAGNOSIS — Z79891 Long term (current) use of opiate analgesic: Secondary | ICD-10-CM

## 2020-02-02 DIAGNOSIS — F411 Generalized anxiety disorder: Secondary | ICD-10-CM | POA: Diagnosis present

## 2020-02-02 DIAGNOSIS — Z7951 Long term (current) use of inhaled steroids: Secondary | ICD-10-CM | POA: Diagnosis not present

## 2020-02-02 DIAGNOSIS — Z560 Unemployment, unspecified: Secondary | ICD-10-CM

## 2020-02-02 DIAGNOSIS — K429 Umbilical hernia without obstruction or gangrene: Secondary | ICD-10-CM | POA: Diagnosis not present

## 2020-02-02 DIAGNOSIS — Z720 Tobacco use: Secondary | ICD-10-CM | POA: Diagnosis not present

## 2020-02-02 DIAGNOSIS — G4700 Insomnia, unspecified: Secondary | ICD-10-CM | POA: Diagnosis present

## 2020-02-02 DIAGNOSIS — I10 Essential (primary) hypertension: Secondary | ICD-10-CM | POA: Diagnosis present

## 2020-02-02 LAB — CBC WITH DIFFERENTIAL/PLATELET
Abs Immature Granulocytes: 0.02 10*3/uL (ref 0.00–0.07)
Basophils Absolute: 0.1 10*3/uL (ref 0.0–0.1)
Basophils Relative: 1 %
Eosinophils Absolute: 0.1 10*3/uL (ref 0.0–0.5)
Eosinophils Relative: 1 %
HCT: 40.3 % (ref 36.0–46.0)
Hemoglobin: 12.4 g/dL (ref 12.0–15.0)
Immature Granulocytes: 0 %
Lymphocytes Relative: 30 %
Lymphs Abs: 3 10*3/uL (ref 0.7–4.0)
MCH: 25.5 pg — ABNORMAL LOW (ref 26.0–34.0)
MCHC: 30.8 g/dL (ref 30.0–36.0)
MCV: 82.8 fL (ref 80.0–100.0)
Monocytes Absolute: 0.9 10*3/uL (ref 0.1–1.0)
Monocytes Relative: 8 %
Neutro Abs: 6.1 10*3/uL (ref 1.7–7.7)
Neutrophils Relative %: 60 %
Platelets: 346 10*3/uL (ref 150–400)
RBC: 4.87 MIL/uL (ref 3.87–5.11)
RDW: 15.3 % (ref 11.5–15.5)
WBC: 10.1 10*3/uL (ref 4.0–10.5)
nRBC: 0 % (ref 0.0–0.2)

## 2020-02-02 LAB — BASIC METABOLIC PANEL
Anion gap: 9 (ref 5–15)
BUN: 14 mg/dL (ref 6–20)
CO2: 22 mmol/L (ref 22–32)
Calcium: 9.4 mg/dL (ref 8.9–10.3)
Chloride: 110 mmol/L (ref 98–111)
Creatinine, Ser: 1.02 mg/dL — ABNORMAL HIGH (ref 0.44–1.00)
GFR calc Af Amer: >60 mL/min (ref >60)
GFR calc non Af Amer: >60 mL/min (ref >60)
Glucose, Bld: 210 mg/dL — ABNORMAL HIGH (ref 70–99)
Potassium: 4.5 mmol/L (ref 3.5–5.1)
Sodium: 141 mmol/L (ref 135–145)

## 2020-02-02 LAB — I-STAT VENOUS BLOOD GAS, ED
Acid-base deficit: 3 mmol/L — ABNORMAL HIGH (ref 0.0–2.0)
Bicarbonate: 22.9 mmol/L (ref 20.0–28.0)
Calcium, Ion: 1.24 mmol/L (ref 1.15–1.40)
HCT: 41 % (ref 36.0–46.0)
Hemoglobin: 13.9 g/dL (ref 12.0–15.0)
O2 Saturation: 100 %
Potassium: 3.9 mmol/L (ref 3.5–5.1)
Sodium: 143 mmol/L (ref 135–145)
TCO2: 24 mmol/L (ref 22–32)
pCO2, Ven: 44 mmHg (ref 44.0–60.0)
pH, Ven: 7.324 (ref 7.250–7.430)
pO2, Ven: 262 mmHg — ABNORMAL HIGH (ref 32.0–45.0)

## 2020-02-02 LAB — SARS CORONAVIRUS 2 BY RT PCR (HOSPITAL ORDER, PERFORMED IN ~~LOC~~ HOSPITAL LAB): SARS Coronavirus 2: NEGATIVE

## 2020-02-02 LAB — I-STAT BETA HCG BLOOD, ED (MC, WL, AP ONLY): I-stat hCG, quantitative: <5 m[IU]/mL (ref <5)

## 2020-02-02 MED ORDER — SODIUM CHLORIDE 0.9% FLUSH: 3.0000 mL | INTRAVENOUS | Status: DC | PRN

## 2020-02-02 MED ORDER — ENOXAPARIN SODIUM 40 MG/0.4ML ~~LOC~~ SOLN
40.0000 mg | SUBCUTANEOUS | Status: DC
  Administered 2020-02-03 – 2020-02-05 (×3): 40 mg via SUBCUTANEOUS
  Filled 2020-02-02 (×3): qty 0.4

## 2020-02-02 MED ORDER — METHYLPREDNISOLONE SODIUM SUCC 125 MG IJ SOLR
INTRAMUSCULAR | Status: AC
  Administered 2020-02-02: 12:00:00 125 mg via INTRAVENOUS
  Filled 2020-02-02: qty 2

## 2020-02-02 MED ORDER — IPRATROPIUM BROMIDE 0.02 % IN SOLN: 0.5000 mg | RESPIRATORY_TRACT | Status: DC

## 2020-02-02 MED ORDER — MAGNESIUM SULFATE 2 GM/50ML IV SOLN
2.0000 g | Freq: Once | INTRAVENOUS | Status: AC
  Administered 2020-02-02: 2 g via INTRAVENOUS
  Filled 2020-02-02: qty 50

## 2020-02-02 MED ORDER — KETAMINE HCL 50 MG/5ML IJ SOSY
25.0000 mg | PREFILLED_SYRINGE | Freq: Once | INTRAMUSCULAR | Status: AC
  Administered 2020-02-02: 25 mg via INTRAVENOUS

## 2020-02-02 MED ORDER — INSULIN ASPART 100 UNIT/ML ~~LOC~~ SOLN
0.0000 [IU] | Freq: Three times a day (TID) | SUBCUTANEOUS | Status: DC
  Administered 2020-02-03 – 2020-02-05 (×4): 1 [IU] via SUBCUTANEOUS

## 2020-02-02 MED ORDER — ALBUTEROL SULFATE (2.5 MG/3ML) 0.083% IN NEBU
INHALATION_SOLUTION | RESPIRATORY_TRACT | Status: AC
  Filled 2020-02-02: qty 15

## 2020-02-02 MED ORDER — IPRATROPIUM-ALBUTEROL 0.5-2.5 (3) MG/3ML IN SOLN: 3.0000 mL | Freq: Four times a day (QID) | RESPIRATORY_TRACT | Status: DC

## 2020-02-02 MED ORDER — LORAZEPAM 2 MG/ML IJ SOLN
1.0000 mg | Freq: Once | INTRAMUSCULAR | Status: AC
  Administered 2020-02-02: 1 mg via INTRAVENOUS
  Filled 2020-02-02: qty 1

## 2020-02-02 MED ORDER — PANTOPRAZOLE SODIUM 40 MG PO TBEC
40.0000 mg | DELAYED_RELEASE_TABLET | Freq: Every day | ORAL | Status: DC
  Administered 2020-02-03 – 2020-02-06 (×4): 40 mg via ORAL
  Filled 2020-02-02 (×4): qty 1

## 2020-02-02 MED ORDER — SODIUM CHLORIDE 0.9% FLUSH
3.0000 mL | Freq: Two times a day (BID) | INTRAVENOUS | Status: DC
  Administered 2020-02-02 – 2020-02-06 (×8): 3 mL via INTRAVENOUS

## 2020-02-02 MED ORDER — IPRATROPIUM-ALBUTEROL 0.5-2.5 (3) MG/3ML IN SOLN: 3.0000 mL | RESPIRATORY_TRACT | Status: DC

## 2020-02-02 MED ORDER — ALBUTEROL SULFATE (2.5 MG/3ML) 0.083% IN NEBU
2.5000 mg | INHALATION_SOLUTION | RESPIRATORY_TRACT | Status: DC
  Administered 2020-02-02 (×3): 2.5 mg via RESPIRATORY_TRACT
  Filled 2020-02-02 (×3): qty 3

## 2020-02-02 MED ORDER — EPINEPHRINE 0.3 MG/0.3ML IJ SOAJ
0.3000 mg | Freq: Once | INTRAMUSCULAR | Status: AC
  Administered 2020-02-02: 0.3 mg via INTRAMUSCULAR

## 2020-02-02 MED ORDER — LORAZEPAM 2 MG/ML IJ SOLN
2.0000 mg | INTRAMUSCULAR | Status: AC | PRN
  Administered 2020-02-02 (×2): 2 mg via INTRAVENOUS
  Filled 2020-02-02 (×2): qty 1

## 2020-02-02 MED ORDER — IPRATROPIUM-ALBUTEROL 0.5-2.5 (3) MG/3ML IN SOLN
3.0000 mL | RESPIRATORY_TRACT | Status: DC
  Administered 2020-02-02: 3 mL via RESPIRATORY_TRACT
  Filled 2020-02-02: qty 3

## 2020-02-02 MED ORDER — ACETAMINOPHEN 650 MG RE SUPP: 650.0000 mg | Freq: Four times a day (QID) | RECTAL | Status: DC | PRN

## 2020-02-02 MED ORDER — ALBUTEROL SULFATE (2.5 MG/3ML) 0.083% IN NEBU
INHALATION_SOLUTION | RESPIRATORY_TRACT | Status: AC
  Administered 2020-02-02: 10 mg via RESPIRATORY_TRACT
  Filled 2020-02-02: qty 12

## 2020-02-02 MED ORDER — PREDNISONE 20 MG PO TABS
40.0000 mg | ORAL_TABLET | Freq: Every day | ORAL | Status: DC
  Administered 2020-02-03: 40 mg via ORAL
  Filled 2020-02-02: qty 2

## 2020-02-02 MED ORDER — ONDANSETRON HCL 4 MG/2ML IJ SOLN: 4.0000 mg | Freq: Four times a day (QID) | INTRAMUSCULAR | Status: DC | PRN

## 2020-02-02 MED ORDER — ALBUTEROL SULFATE (2.5 MG/3ML) 0.083% IN NEBU
2.5000 mg | INHALATION_SOLUTION | RESPIRATORY_TRACT | Status: DC | PRN
  Administered 2020-02-04: 2.5 mg via RESPIRATORY_TRACT
  Filled 2020-02-02: qty 3

## 2020-02-02 MED ORDER — SODIUM CHLORIDE 0.9 % IV SOLN: 250.0000 mL | INTRAVENOUS | Status: DC | PRN

## 2020-02-02 MED ORDER — IPRATROPIUM BROMIDE 0.02 % IN SOLN
RESPIRATORY_TRACT | Status: AC
  Filled 2020-02-02: qty 7.5

## 2020-02-02 MED ORDER — IPRATROPIUM BROMIDE 0.02 % IN SOLN
0.5000 mg | RESPIRATORY_TRACT | Status: DC
  Administered 2020-02-02: 0.5 mg via RESPIRATORY_TRACT
  Filled 2020-02-02: qty 2.5

## 2020-02-02 MED ORDER — METHYLPREDNISOLONE SODIUM SUCC 125 MG IJ SOLR: 125.0000 mg | Freq: Four times a day (QID) | INTRAMUSCULAR | Status: DC

## 2020-02-02 MED ORDER — ONDANSETRON HCL 4 MG PO TABS: 4.0000 mg | ORAL_TABLET | Freq: Four times a day (QID) | ORAL | Status: DC | PRN

## 2020-02-02 MED ORDER — ACETAMINOPHEN 325 MG PO TABS
650.0000 mg | ORAL_TABLET | Freq: Four times a day (QID) | ORAL | Status: DC | PRN
  Administered 2020-02-03 – 2020-02-05 (×4): 650 mg via ORAL
  Filled 2020-02-02 (×4): qty 2

## 2020-02-02 MED ORDER — IPRATROPIUM-ALBUTEROL 0.5-2.5 (3) MG/3ML IN SOLN
3.0000 mL | RESPIRATORY_TRACT | Status: DC
  Administered 2020-02-03 – 2020-02-06 (×22): 3 mL via RESPIRATORY_TRACT
  Filled 2020-02-02 (×22): qty 3

## 2020-02-02 MED ORDER — LORAZEPAM 2 MG/ML IJ SOLN: 2.0000 mg | Freq: Once | INTRAMUSCULAR | Status: DC

## 2020-02-02 MED ORDER — FLUTICASONE FUROATE-VILANTEROL 200-25 MCG/INH IN AEPB
1.0000 | INHALATION_SPRAY | Freq: Every day | RESPIRATORY_TRACT | Status: DC
  Filled 2020-02-02: qty 28

## 2020-02-02 MED ORDER — KETAMINE HCL 50 MG/5ML IJ SOSY
25.0000 mg | PREFILLED_SYRINGE | Freq: Once | INTRAMUSCULAR | Status: AC
  Filled 2020-02-02: qty 5

## 2020-02-02 MED ORDER — OXYCODONE HCL 5 MG PO TABS
10.0000 mg | ORAL_TABLET | Freq: Four times a day (QID) | ORAL | Status: DC | PRN
  Administered 2020-02-02 – 2020-02-06 (×11): 10 mg via ORAL
  Filled 2020-02-02 (×11): qty 2

## 2020-02-02 MED ORDER — KETAMINE HCL 50 MG/5ML IJ SOSY
PREFILLED_SYRINGE | INTRAMUSCULAR | Status: AC
  Administered 2020-02-02: 25 mg via INTRAVENOUS
  Filled 2020-02-02: qty 5

## 2020-02-02 MED ORDER — METHYLPREDNISOLONE SODIUM SUCC 125 MG IJ SOLR: 125.0000 mg | Freq: Once | INTRAMUSCULAR | Status: AC

## 2020-02-02 MED ORDER — IPRATROPIUM-ALBUTEROL 0.5-2.5 (3) MG/3ML IN SOLN
3.0000 mL | Freq: Once | RESPIRATORY_TRACT | Status: AC
  Administered 2020-02-02: 3 mL via RESPIRATORY_TRACT

## 2020-02-02 NOTE — Plan of Care (Signed)

## 2020-02-02 NOTE — ED Provider Notes (Addendum)
[facility] EMERGENCY DEPARTMENT Provider Note   CSN: [id number] Arrival date & time: 02/02/20  1157     History Chief Complaint  Patient presents with   Shortness of Breath    Julie Kerr is a 44 y.o. female with past medical history of COPD, ongoing tobacco use, severe asthma requiring several intubations and tracheostomy, presents to the ER by private vehicle for evaluation of sudden onset shortness of breath associated with wheezing, chest tightness, cough this morning when she woke up.  States for the last 2 days she has felt like her asthma has been getting worse.  Continues to smoke cigarettes.  Has some generalized chest tightness.  Denies fever.  Has not been vaccinated for COVID-19.  Denies exposure to sick contacts, recent travel.  Denies known heart problems.  Denies leg swelling.  No history of blood clots.  Has breathing treatments at home that she takes, states she is compliant with these daily. Level 5 caveat due to acuity of condition. Patient speaking in 1-2 word sentences, nodding/shaking head, using fingers to answer questions.   HPI     Past Medical History:  Diagnosis Date   Anxiety    Asthma    Bronchitis    Chronic narcotic dependence (HCC)    Heart murmur    Hypertension    Shortness of breath    Vocal cord dysfunction     Patient Active Problem List   Diagnosis Date Noted   Acute dyspnea 06/20/2017   Chronic narcotic dependence (HCC) 06/20/2017   Hypertension 06/20/2017   Vocal cord dysfunction 06/20/2017   Obesity (BMI 30.0-34.9) 06/20/2017   Right ear pain 06/20/2017   GERD (gastroesophageal reflux disease) 02/03/2016   Obesity 08/24/2015   Tobacco abuse    Cocaine abuse (HCC)    Leukocytosis    Anxiety and depression    Substance abuse (HCC)    Asthma 07/27/2015   Frequent falls 05/29/2015   COPD (chronic obstructive pulmonary disease) (HCC) 05/08/2015   Alcohol abuse    Depression     Adjustment disorder with anxious mood    Hereditary and idiopathic peripheral neuropathy    Chronic asthmatic bronchitis (HCC) 08/21/2014   Cough    Polysubstance abuse (HCC) 07/28/2014   Generalized anxiety disorder 01/20/2014   Panic attacks 01/20/2014   Upper airway cough syndrome, severe, with clinical VCD 12/07/2013   Stridor 10/29/2013   Anemia 08/04/2013   Tobacco use disorder 06/17/2013   Chronic pain syndrome 06/17/2013    Past Surgical History:  Procedure Laterality Date   SPINAL FUSION  11/2012   [facility] [place] Mayford KnifeWilliams   TRACHEOSTOMY  December 2015   TRACHEOSTOMY CLOSURE       OB History   No obstetric history on file.     Family History  Problem Relation Age of Onset   HIV Mother    Heart disease Father    CVA Father    Heart disease Other    Emphysema Maternal Grandmother        smoked   Clotting disorder Maternal Grandmother    Lung cancer Maternal Grandmother        smoked   Asthma Sister    Clotting disorder Sister     Social History   Tobacco Use   Smoking status: Former Smoker    Packs/day: 0.00    Years: 17.00    Pack years: 0.00    Types: Cigarettes    Quit date: 06/15/2014  Years since quitting: 5.6   Smokeless tobacco: Never Used  Vaping Use   Vaping Use: Never used  Substance Use Topics   Alcohol use: Yes    Alcohol/week: 0.0 standard drinks    Comment: occasional   Drug use: Yes    Types: Cocaine    Comment: 2017    Home Medications Prior to Admission medications   Medication Sig Start Date End Date Taking? Authorizing Provider  acetaminophen (TYLENOL) 325 MG tablet Take 2 tablets (650 mg total) every 6 (six) hours as needed by mouth for mild pain (or Fever >/= 101). 06/22/17   Sheikh, Kateri Mc Latif, DO  albuterol (PROVENTIL HFA;VENTOLIN HFA) 108 (90 Base) MCG/ACT inhaler Inhale 2 puffs into the lungs every 6 (six) hours as needed for wheezing or shortness of breath. 10/25/16    Hoy Register, MD  ALPRAZolam Prudy Feeler) 0.5 MG tablet Take 1 tablet (0.5 mg total) by mouth 3 (three) times daily as needed for anxiety. Patient taking differently: Take 1 mg by mouth 3 (three) times daily as needed for anxiety.  02/03/16   Storm Frisk, MD  alteplase (ACTIVASE) 1 mg/mL injection 2 mg by Intracatheter route once as needed.    [provider]  benzonatate (TESSALON) 200 MG capsule Take 1 capsule (200 mg total) 3 (three) times daily by mouth. 06/22/17   Sheikh, Omair Latif, DO  fluticasone (FLONASE) 50 MCG/ACT nasal spray Place 2 sprays into both nostrils 2 (two) times daily. 02/03/16   Storm Frisk, MD  fluticasone furoate-vilanterol (BREO ELLIPTA) 200-25 MCG/INH AEPB Inhale 1 puff into the lungs daily.    [provider]  gabapentin (NEURONTIN) 300 MG capsule Take 2 capsules (600 mg total) by mouth 2 (two) times daily. Patient taking differently: Take 400 mg by mouth 3 (three) times daily.  02/03/16   Storm Frisk, MD  guaiFENesin (MUCINEX) 600 MG 12 hr tablet Take 2 tablets (1,200 mg total) 2 (two) times daily by mouth. 06/22/17   Sheikh, Omair Latif, DO  ipratropium-albuterol (DUONEB) 0.5-2.5 (3) MG/3ML SOLN Take 3 mLs by nebulization every 4 (four) hours as needed (shortness of breath/wheezing). 10/25/16   Hoy Register, MD  levofloxacin (LEVAQUIN) 750 MG tablet Take 1 tablet (750 mg total) daily by mouth. 06/23/17   Marguerita Merles Latif, DO  meloxicam (MOBIC) 7.5 MG tablet Take 7.5 mg daily by mouth. 06/06/17   [provider]  methocarbamol (ROBAXIN) 500 MG tablet Take 1 tablet (500 mg total) by mouth 2 (two) times daily. 03/09/19   Khatri, Hina, PA-C  morphine (MSIR) 30 MG tablet Take 30 mg by mouth every 12 (twelve) hours.    [provider]  oxyCODONE (ROXICODONE) 15 MG immediate release tablet Take 15 mg by mouth every 4 (four) hours as needed for pain.    [provider]  oxymetazoline (AFRIN) 0.05 % nasal spray Place 1  spray 2 (two) times daily into both nostrils. 06/22/17   Sheikh, Omair Latif, DO  pantoprazole (PROTONIX) 40 MG tablet Take 1 tablet (40 mg total) 2 (two) times daily by mouth. 06/22/17   Sheikh, Omair Latif, DO  predniSONE (STERAPRED UNI-PAK 21 TAB) 10 MG (21) TBPK tablet Take 6 Tablets Day 1, 5 Tablets Day 2, 4 Tablets Day 2, 3 Tablets Day 4, 2 Tablets Day 5, 1 Tablet Day 6, and Stop Day 7 06/22/17   Sheikh, Omair Latif, DO  verapamil (VERELAN) 100 MG 24 hr capsule Take 100 mg by mouth at bedtime.    [provider]  zolpidem (AMBIEN) 10 MG tablet Take 10 mg by mouth at bedtime. Reported on 12/03/2015 04/30/15   [provider]    Allergies    Robitussin dm [dextromethorphan-guaifenesin]; Chocolate; Suboxone [buprenorphine hcl-naloxone hcl]; Nsaids; Rayon, purified; Tolmetin; and Tramadol  Review of Systems   Review of Systems  Respiratory: Positive for cough, chest tightness, shortness of breath and wheezing.   All other systems reviewed and are negative.   Physical Exam Updated Vital Signs BP 126/71    Pulse (!) 112    Resp 19    Ht 5\' 5"  (1.651 m)    Wt 84 kg    LMP 02/02/2020    SpO2 100%    BMI 30.82 kg/m   Physical Exam Vitals and nursing note reviewed.  Constitutional:      General: She is in acute distress.     Appearance: She is well-developed. She is ill-appearing and diaphoretic.     Comments: I was called to bedside by RN due to patient's increased work of breathing.  Patient diaphoretic, appears anxious, ill-appearing.  HENT:     Head: Normocephalic and atraumatic.     Right Ear: External ear normal.     Left Ear: External ear normal.     Nose: Nose normal.  Eyes:     General: No scleral icterus.    Conjunctiva/sclera: Conjunctivae normal.  Cardiovascular:     Rate and Rhythm: Normal rate and regular rhythm.     Heart sounds: Normal heart sounds. No murmur heard.      Comments: No lower extremity edema.  No calf tenderness. Pulmonary:     Effort:  Pulmonary effort is normal.     Breath sounds: Wheezing present.     Comments: Frequent wet sounding cough during exam.  Patient on albuterol neb on my exam.  Sitting up straight on the side of the bed.  Audible wheezing.  Auscultated wheezing throughout all lung fields.  Patient speaking in 1-2 word sentences. Musculoskeletal:        General: No deformity. Normal range of motion.     Cervical back: Normal range of motion and neck supple.  Skin:    General: Skin is warm.     Capillary Refill: Capillary refill takes less than 2 seconds.  Neurological:     Mental Status: She is alert and oriented to person, place, and time.  Psychiatric:        Behavior: Behavior normal.        Thought Content: Thought content normal.        Judgment: Judgment normal.     ED Results / Procedures / Treatments   Labs (all labs ordered are listed, but only abnormal results are displayed) Labs Reviewed  BASIC METABOLIC PANEL - Abnormal; Notable for the following components:      Result Value   Glucose, Bld 210 (*)    Creatinine, Ser 1.02 (*)    All other components within normal limits  CBC WITH DIFFERENTIAL/PLATELET - Abnormal; Notable for the following components:   MCH 25.5 (*)    All other components within normal limits  SARS CORONAVIRUS 2 BY RT PCR (HOSPITAL ORDER, PERFORMED IN [facility])  I-STAT BETA HCG BLOOD, ED ([facility], [facility], [facility] ONLY)  POC SARS CORONAVIRUS 2 AG -  ED    EKG EKG Interpretation  Date/Time:  Sunday February 02 2020 12:05:16 EDT Ventricular Rate:  127 PR Interval:    QRS Duration: 108 QT Interval:  338 QTC  Calculation: 492 R Axis:   59 Text Interpretation: Sinus tachycardia Atrial premature complex Anteroseptal infarct, old Minimal ST depression, lateral leads Minimal ST elevation, lateral leads Baseline wander in lead(s) I II aVR V1 V6 Confirmed by Lacretia Leigh ([id number]) on 02/02/2020 12:19:37 PM   Radiology DG Chest Port 1 View  Result Date:  02/02/2020 CLINICAL DATA:  Severe acute respiratory distress. Wheezing. Asthma. EXAM: PORTABLE CHEST 1 VIEW COMPARISON:  03/09/2019 FINDINGS: The heart size and mediastinal contours are within normal limits. Both lungs are clear. The visualized skeletal structures are unremarkable. IMPRESSION: Normal exam. Electronically Signed   By: Lorriane Shire M.D.   On: 02/02/2020 14:04    Procedures .Critical Care Performed by: Kinnie Feil, PA-C Authorized by: Kinnie Feil, PA-C   Critical care provider statement:    Critical care time (minutes):  45   Critical care was necessary to treat or prevent imminent or life-threatening deterioration of the following conditions:  Respiratory failure (Severe asthma exacerbation, BiPAP)   Critical care was time spent personally by me on the following activities:  Discussions with consultants, evaluation of patient's response to treatment, examination of patient, ordering and performing treatments and interventions, ordering and review of laboratory studies, ordering and review of radiographic studies, pulse oximetry, re-evaluation of patient's condition, obtaining history from patient or surrogate and review of old charts   I assumed direction of critical care for this patient from another provider in my specialty: no     (including critical care time)  Medications Ordered in ED Medications  albuterol (PROVENTIL) (2.5 MG/3ML) 0.083% nebulizer solution (has no administration in time range)  ipratropium (ATROVENT) 0.02 % nebulizer solution (has no administration in time range)  ketamine HCl 50 MG/5ML SOSY (has no administration in time range)  ipratropium-albuterol (DUONEB) 0.5-2.5 (3) MG/3ML nebulizer solution 3 mL (3 mLs Nebulization Given 02/02/20 1204)  magnesium sulfate IVPB 2 g 50 mL (0 g Intravenous Stopped 02/02/20 1313)  methylPREDNISolone sodium succinate (SOLU-MEDROL) 125 mg/2 mL injection 125 mg (125 mg Intravenous Given 02/02/20 1207)   EPINEPHrine (EPI-PEN) injection 0.3 mg (0.3 mg Intramuscular Given 02/02/20 1211)  LORazepam (ATIVAN) injection 1 mg (1 mg Intravenous Given 02/02/20 1215)  ketamine 50 mg in normal saline 5 mL (10 mg/mL) syringe (25 mg Intravenous Given 02/02/20 1235)  albuterol (PROVENTIL) (2.5 MG/3ML) 0.083% nebulizer solution (10 mg Nebulization Given 02/02/20 1257)    ED Course  I have reviewed the triage vital signs and the nursing notes.  Pertinent labs & imaging results that were available during my care of the patient were reviewed by me and considered in my medical decision making (see chart for details).  Clinical Course as of Feb 02 1408  Sun Feb 02, 2020  1333 Sinus tachycardia Atrial premature complex Anteroseptal infarct, old Minimal ST depression, lateral leads Minimal ST elevation, lateral leads Baseline wander in lead(s) I II aVR V1 V6 Confirmed by Lacretia Leigh ([id number]) on 02/02/2020 12:19:37 PM  EKG 12-Lead [CG]    Clinical Course User Index [CG] Arlean Hopping   MDM Rules/Calculators/A&P                           44 year old female with history of asthma, ongoing tobacco use, previous hospitalizations requiring several intubations, tracheostomy presents to the ER for shortness of breath. States 2 days prior her coughing worsened.  Previous medical records available, nursing notes reviewed to obtain more history and assist with MDM.  Chief  complaint involves extensive number of treatment options, high risk of complications, morbidity and mortality.  Differential diagnosis includes acute asthma exacerbation, COPD exacerbation, pneumonia, COVID-19. No previous history of heart failure, signs of hypervolemia and acute CHF decompensation, ACS unlikely. Doubt PE given her clinical exam.  Was called bedside during triage by nurse given patient's obvious work of breathing.  SpO2 88%. Obvious increased work of breathing.  Audible wheezing HR in 130s. Patient very anxious.   RN  paged RT who requested orders be put in before coming down to assist with patient care.  RN paged RT from upstairs for assistance.    I ordered Solu-Medrol, magnesium, albuterol neb, epinephrine IM given. EDP at bedside.   I ordered labs including COVID-19 test.  I ordered an EKG, chest x-ray.  Patient very anxious about going on BiPAP.  Ativan 2 mg given here. Patient did not respond to this and was obviously very anxious, distressed not tolerating BiPAP.  Ketamine 25 mg given to assist with BiPAP tolerance.  1406: Patient is a monitored here in the ER and has had significant improvement in anxiety with ketamine, tolerating BiPAP. Overall significant improvement in her respiratory effort. Remains tachycardic.  I ordered, reviewed and personally visualized and interpreted the above labs and/or imaging studies.    ER work up reveals normal white count, mild hyperglycemia without evidence of diabetic crisis. Rapid Covid is negative. EKG shows sinus tachycardia but no obvious signs of cardiac ischemia. Chest x-ray nonacute.  We will consult medicine for admission.  Final Clinical Impression(s) / ED Diagnoses Final diagnoses:  Hypoxia    Rx / DC Orders ED Discharge Orders    None       Jerrell Mylar 02/02/20 1410    Lorre Nick, MD 02/02/20 [phone]

## 2020-02-02 NOTE — ED Triage Notes (Signed)
Pt arrives POV for eval of severe respiratory distress. Pt arrives w/ a respiratory rate of approx 40-50, LS are silent in the bases, rhonchorous and wheezy in the uppers. Pt reports hx of asthma, intubated for same 5 times in the past. Pt tripoding, all accessory muscles used, satting 88% on RA, obvious stridor audible from doorway.

## 2020-02-02 NOTE — ED Notes (Signed)
Pt arrived in resp distress.  Straight to Room 3.  RT called and EDP notified.

## 2020-02-02 NOTE — ED Provider Notes (Signed)
Medical screening examination/treatment/procedure(s) were conducted as a shared visit with non-physician practitioner(s) and myself.  I personally evaluated the patient during the encounter.  EKG Interpretation  Date/Time:  Sunday February 02 2020 12:05:16 EDT Ventricular Rate:  127 PR Interval:    QRS Duration: 108 QT Interval:  338 QTC Calculation: 492 R Axis:   59 Text Interpretation: Sinus tachycardia Atrial premature complex Anteroseptal infarct, old Minimal ST depression, lateral leads Minimal ST elevation, lateral leads Baseline wander in lead(s) I II aVR V1 V6 Confirmed by Lorre Nick ([id number]) on 02/02/2020 12:32:51 PM  44 year old female with history of asthma including intubation presents acutely short of breath.  Patient is wheezing throughout.  Was given epinephrine, albuterol, Solu-Medrol.  Will place on BiPAP after she receives Ativan.  Will check chest x-ray and she will require admission.   Lorre Nick, MD 02/02/20 1220

## 2020-02-02 NOTE — H&P (Signed)
Date: 02/02/2020               Patient Name:  Julie Kerr MRN: [id number]  DOB: 09/28/1975 Age / Sex: 44 y.o., female   PCP: Patient, No Pcp Per         Medical Service: Internal Medicine Teaching Service         Attending Physician: Dr. Gust Rung, DO    First Contact: Dr. Ephriam Knuckles, Rylee Pager: [phone]  Second Contact: Dr. Judeth Cornfield Pager: [phone]       After Hours (After 5p/  First Contact Pager: [phone]  weekends / holidays): Second Contact Pager: [phone]   Chief Complaint: Dyspnea  History of Present Illness:   Julie Kerr is a 44 yo F w/ PMh of chronic pain syndrome on opioids, generalized anxiety disorder, asthma, vocal cord dysfunction presenting to [facility] with dyspnea. She was observed to be in distress, tearful and agitated. Ketamine injection was being provided to her and she was unable to answer questions. Further history was obtained via her daughter. She was in her usual state of health until this morning when she called her daughter complaining of shortness of breath. Her daughter noted that she was using her rescue inhalers without significant improvement. She was also noted to be in emotional distress without obvious cause. She mentions that this appeared similar to her prior episodes of asthma exacerbations and she was brought to ED for evaluation. She denies any fevers, chills, nausea, vomiting. Denies any sick contact. Mentions good adherence to her maintenance inhalers.  Chart review shows significant history of drug-seeking behavior and establishing care with various pain clinics. She was also seen by Dr.Wright with pulmonary who describes history of severe vocal cord dysfunction and COPD/Asthma driven markedly worse by panic/anxiety disorders. Previously on Xanax and currently on oxycodone for chronic pain.  In the ED, she was found to be in acute hypoxic respiratory failure with marked anxiety. Treated w/ solumedrol, epinephrine, duonebs, mag, ativan and  ketamine. She was also put on Bipap with improvement in her oxygenation. IMTS was consulted for admission  Meds:  No current facility-administered medications on file prior to encounter.   Current Outpatient Medications on File Prior to Encounter  Medication Sig Dispense Refill  . acetaminophen (TYLENOL) 325 MG tablet Take 2 tablets (650 mg total) every 6 (six) hours as needed by mouth for mild pain (or Fever >/= 101). 20 tablet 0  . albuterol (PROVENTIL HFA;VENTOLIN HFA) 108 (90 Base) MCG/ACT inhaler Inhale 2 puffs into the lungs every 6 (six) hours as needed for wheezing or shortness of breath. 1 Inhaler 0  . ALPRAZolam (XANAX) 0.5 MG tablet Take 1 tablet (0.5 mg total) by mouth 3 (three) times daily as needed for anxiety. (Patient taking differently: Take 1 mg by mouth 3 (three) times daily as needed for anxiety. ) 90 tablet 0  . alteplase (ACTIVASE) 1 mg/mL injection 2 mg by Intracatheter route once as needed.    . benzonatate (TESSALON) 200 MG capsule Take 1 capsule (200 mg total) 3 (three) times daily by mouth. 20 capsule 0  . fluticasone (FLONASE) 50 MCG/ACT nasal spray Place 2 sprays into both nostrils 2 (two) times daily. 16 g 6  . fluticasone furoate-vilanterol (BREO ELLIPTA) 200-25 MCG/INH AEPB Inhale 1 puff into the lungs daily.    Marland Kitchen gabapentin (NEURONTIN) 300 MG capsule Take 2 capsules (600 mg total) by mouth 2 (two) times daily. (Patient taking differently: Take 400 mg by mouth 3 (three) times  daily. ) 120 capsule 3  . guaiFENesin (MUCINEX) 600 MG 12 hr tablet Take 2 tablets (1,200 mg total) 2 (two) times daily by mouth. 10 tablet 0  . ipratropium-albuterol (DUONEB) 0.5-2.5 (3) MG/3ML SOLN Take 3 mLs by nebulization every 4 (four) hours as needed (shortness of breath/wheezing). 360 mL 0  . levofloxacin (LEVAQUIN) 750 MG tablet Take 1 tablet (750 mg total) daily by mouth. 4 tablet 0  . meloxicam (MOBIC) 7.5 MG tablet Take 7.5 mg daily by mouth.  0  . methocarbamol (ROBAXIN) 500 MG  tablet Take 1 tablet (500 mg total) by mouth 2 (two) times daily. 20 tablet 0  . morphine (MSIR) 30 MG tablet Take 30 mg by mouth every 12 (twelve) hours.    Marland Kitchen oxyCODONE (ROXICODONE) 15 MG immediate release tablet Take 15 mg by mouth every 4 (four) hours as needed for pain.    Marland Kitchen oxymetazoline (AFRIN) 0.05 % nasal spray Place 1 spray 2 (two) times daily into both nostrils. 30 mL 0  . pantoprazole (PROTONIX) 40 MG tablet Take 1 tablet (40 mg total) 2 (two) times daily by mouth. 60 tablet 0  . predniSONE (STERAPRED UNI-PAK 21 TAB) 10 MG (21) TBPK tablet Take 6 Tablets Day 1, 5 Tablets Day 2, 4 Tablets Day 2, 3 Tablets Day 4, 2 Tablets Day 5, 1 Tablet Day 6, and Stop Day 7 21 tablet 0  . verapamil (VERELAN) 100 MG 24 hr capsule Take 100 mg by mouth at bedtime.    Marland Kitchen zolpidem (AMBIEN) 10 MG tablet Take 10 mg by mouth at bedtime. Reported on 12/03/2015  0   Allergies: Allergies as of 02/02/2020 - Review Complete 02/02/2020  Allergen Reaction Noted  . Robitussin dm [dextromethorphan-guaifenesin] Nausea And Vomiting 08/03/2013  . Chocolate Hives 03/27/2015  . Suboxone [buprenorphine hcl-naloxone hcl] Other (See Comments) 07/27/2015  . Nsaids Hives 03/29/2013  . Rayon, purified Hives 01/19/2014  . Tolmetin Hives 03/29/2013  . Tramadol Hives and Itching 03/29/2013   Past Medical History:  Diagnosis Date  . Anxiety   . Asthma   . Bronchitis   . Chronic narcotic dependence ([place])   . Heart murmur   . Hypertension   . Shortness of breath   . Vocal cord dysfunction    Family History: Sister passed away from lung cancer. Unable to provide further family history  Social History: Lives with her son. Currently unemployed. Denies any current tobacco, alcohol, illicit substance use. Previously has smoked cigarettes and cocaine.  Review of Systems: A complete ROS was negative except as per HPI.   Physical Exam: Blood pressure (!) 143/98, pulse (!) 107, resp. rate (!) 21, height 5\' 5"  (1.651 m),  weight 84 kg, last menstrual period 02/02/2020, SpO2 95 %.  Physical Exam Constitutional:      General: She is in acute distress (Tearful).     Appearance: She is well-developed. She is obese. She is ill-appearing.  HENT:     Mouth/Throat:     Comments: Currently on BiPAP Eyes:     Extraocular Movements: Extraocular movements intact.  Cardiovascular:     Rate and Rhythm: Regular rhythm. Tachycardia present.     Heart sounds: No murmur heard.   Pulmonary:     Effort: Pulmonary effort is normal. No respiratory distress.     Breath sounds: Decreased breath sounds and wheezing (Faint expiratory wheezes) present. No rhonchi or rales.  Abdominal:     General: Bowel sounds are normal.     Palpations: Abdomen is soft.  Tenderness: There is no abdominal tenderness.  Musculoskeletal:        General: Normal range of motion.     Cervical back: Normal range of motion and neck supple.     Right lower leg: No edema.     Left lower leg: No edema.  Skin:    General: Skin is warm and dry.  Neurological:     General: No focal deficit present.     Mental Status: She is alert.  Psychiatric:        Mood and Affect: Mood is anxious.        Behavior: Behavior is agitated.    EKG: personally reviewed my interpretation is significant motion artifact, sinus tachycardia, no ischemic changes  CXR: personally reviewed my interpretation is poor inspiratory effort, no lobar consolidation, no pulmonary edema, no pleural effusions  Assessment & Plan by Problem: Principal Problem:   Asthma exacerbation Active Problems:   Generalized anxiety disorder   Panic attacks   Vocal cord dysfunction   Obesity (BMI 30.0-34.9)  Julie Kerr is a 44 yo F w/ PMh of polysubstance use, generalized anxiety disorder, asthma, vocal cord dysfunction admit for asthma exacerbation and panic attack.  Acute hypoxic respiratory failure Asthma exacerbation Vocal cord dysfunction Noted to be in respiratory distress on  admission, O2 sat 88%. Abgs not collected on admission. VBG pH 7.32. Chest X-ray w/o acute findings. Significant wheezing on exam. No PFTs on file but chart review shows extensive history of prior admissions for similar symptoms. Prior documentation of poor adherence to maintenance therapy due to cost issues but family states otherwise. Currently satting 99% on bipap. Received mag, solumedrol and duonebs in ED.  - Bipap prn - F/u ABG - S&S eval - Prednisone 40mg  daily - Scheduled duonebs - Kep O2 sat 88-94% - Incentive spirometry - Pantoprazole 40mg  daily - C/w home meds: Breo 1 puff daily - SSIs, Glucose checks during prednisone use  Panic Attack Generalized Anxiety Disorder Previous hx of respiratory distress exacerbated by panic attacks. Appears to be in panic attack currently. Previously on xanax at home although PDMP shows no recent dispense. - Ativan Prn  Chronic Pain Syndrome Follows with pain clinic at [facility]. Currently on oxycodone 15mg  q6hr PRN #120 in 30 days. Currently endorsing pain 'all over.' Prior hx of drug-seeking behaviors and concern for falsifying symptoms. - C/w home meds: oxycodone PRN - Close monitoring  DVT prophx: Lovenox Diet: NPO Code: Full  Prior to Admission Living Arrangement: Home Anticipated Discharge Location: Home Barriers to Discharge: Medical treatment Dispo: Anticipated discharge in approximately 3-4 day(s).   Dispo: Admit patient to Inpatient with expected length of stay greater than 2 midnights.  Signed: , MD 02/02/2020, 4:59 PM Pager: [phone] After 5pm on weekdays and 1pm on weekends: On Call Pager: [phone]

## 2020-02-03 DIAGNOSIS — F411 Generalized anxiety disorder: Secondary | ICD-10-CM

## 2020-02-03 DIAGNOSIS — Z6830 Body mass index (BMI) 30.0-30.9, adult: Secondary | ICD-10-CM

## 2020-02-03 DIAGNOSIS — E669 Obesity, unspecified: Secondary | ICD-10-CM

## 2020-02-03 DIAGNOSIS — J9601 Acute respiratory failure with hypoxia: Secondary | ICD-10-CM

## 2020-02-03 DIAGNOSIS — T380X5A Adverse effect of glucocorticoids and synthetic analogues, initial encounter: Secondary | ICD-10-CM

## 2020-02-03 DIAGNOSIS — G4700 Insomnia, unspecified: Secondary | ICD-10-CM

## 2020-02-03 DIAGNOSIS — D72828 Other elevated white blood cell count: Secondary | ICD-10-CM

## 2020-02-03 DIAGNOSIS — R739 Hyperglycemia, unspecified: Secondary | ICD-10-CM

## 2020-02-03 DIAGNOSIS — M549 Dorsalgia, unspecified: Secondary | ICD-10-CM

## 2020-02-03 DIAGNOSIS — D509 Iron deficiency anemia, unspecified: Secondary | ICD-10-CM

## 2020-02-03 DIAGNOSIS — Z720 Tobacco use: Secondary | ICD-10-CM

## 2020-02-03 DIAGNOSIS — J383 Other diseases of vocal cords: Principal | ICD-10-CM

## 2020-02-03 DIAGNOSIS — J45901 Unspecified asthma with (acute) exacerbation: Secondary | ICD-10-CM

## 2020-02-03 LAB — GLUCOSE, CAPILLARY
Glucose-Capillary: 104 mg/dL — ABNORMAL HIGH (ref 70–99)
Glucose-Capillary: 118 mg/dL — ABNORMAL HIGH (ref 70–99)
Glucose-Capillary: 154 mg/dL — ABNORMAL HIGH (ref 70–99)
Glucose-Capillary: 175 mg/dL — ABNORMAL HIGH (ref 70–99)
Glucose-Capillary: 214 mg/dL — ABNORMAL HIGH (ref 70–99)
Glucose-Capillary: 224 mg/dL — ABNORMAL HIGH (ref 70–99)

## 2020-02-03 LAB — BASIC METABOLIC PANEL
Anion gap: 8 (ref 5–15)
BUN: 12 mg/dL (ref 6–20)
CO2: 22 mmol/L (ref 22–32)
Calcium: 9.2 mg/dL (ref 8.9–10.3)
Chloride: 107 mmol/L (ref 98–111)
Creatinine, Ser: 0.81 mg/dL (ref 0.44–1.00)
GFR calc Af Amer: >60 mL/min (ref >60)
GFR calc non Af Amer: >60 mL/min (ref >60)
Glucose, Bld: 193 mg/dL — ABNORMAL HIGH (ref 70–99)
Potassium: 5.1 mmol/L (ref 3.5–5.1)
Sodium: 137 mmol/L (ref 135–145)

## 2020-02-03 LAB — CBC
HCT: 35.4 % — ABNORMAL LOW (ref 36.0–46.0)
Hemoglobin: 11.1 g/dL — ABNORMAL LOW (ref 12.0–15.0)
MCH: 25.8 pg — ABNORMAL LOW (ref 26.0–34.0)
MCHC: 31.4 g/dL (ref 30.0–36.0)
MCV: 82.1 fL (ref 80.0–100.0)
Platelets: 286 10*3/uL (ref 150–400)
RBC: 4.31 MIL/uL (ref 3.87–5.11)
RDW: 15.2 % (ref 11.5–15.5)
WBC: 14.9 10*3/uL — ABNORMAL HIGH (ref 4.0–10.5)
nRBC: 0 % (ref 0.0–0.2)

## 2020-02-03 LAB — HIV ANTIBODY (ROUTINE TESTING W REFLEX): HIV Screen 4th Generation wRfx: NONREACTIVE

## 2020-02-03 MED ORDER — ZOLPIDEM TARTRATE 5 MG PO TABS
10.0000 mg | ORAL_TABLET | Freq: Every day | ORAL | Status: DC
  Administered 2020-02-03 – 2020-02-05 (×2): 10 mg via ORAL
  Filled 2020-02-03 (×3): qty 2

## 2020-02-03 MED ORDER — ALPRAZOLAM 0.5 MG PO TABS
1.0000 mg | ORAL_TABLET | Freq: Once | ORAL | Status: AC | PRN
  Administered 2020-02-03: 1 mg via ORAL
  Filled 2020-02-03: qty 2

## 2020-02-03 MED ORDER — GUAIFENESIN ER 600 MG PO TB12
600.0000 mg | ORAL_TABLET | Freq: Two times a day (BID) | ORAL | Status: DC
  Administered 2020-02-03 – 2020-02-06 (×7): 600 mg via ORAL
  Filled 2020-02-03 (×7): qty 1

## 2020-02-03 MED ORDER — MOMETASONE FURO-FORMOTEROL FUM 100-5 MCG/ACT IN AERO
2.0000 | INHALATION_SPRAY | Freq: Two times a day (BID) | RESPIRATORY_TRACT | Status: DC
  Administered 2020-02-03 – 2020-02-06 (×6): 2 via RESPIRATORY_TRACT
  Filled 2020-02-03: qty 8.8

## 2020-02-03 MED ORDER — PREDNISONE 20 MG PO TABS
20.0000 mg | ORAL_TABLET | Freq: Every day | ORAL | Status: DC
  Administered 2020-02-05 – 2020-02-06 (×2): 20 mg via ORAL
  Filled 2020-02-03 (×2): qty 1

## 2020-02-03 MED ORDER — GABAPENTIN 400 MG PO CAPS
400.0000 mg | ORAL_CAPSULE | Freq: Three times a day (TID) | ORAL | Status: DC
  Administered 2020-02-03 – 2020-02-06 (×9): 400 mg via ORAL
  Filled 2020-02-03 (×9): qty 1

## 2020-02-03 MED ORDER — PREDNISONE 20 MG PO TABS
40.0000 mg | ORAL_TABLET | Freq: Every day | ORAL | Status: AC
  Administered 2020-02-04: 40 mg via ORAL
  Filled 2020-02-03: qty 2

## 2020-02-03 MED ORDER — PREDNISONE 10 MG PO TABS: 10.0000 mg | ORAL_TABLET | Freq: Every day | ORAL | Status: DC

## 2020-02-03 MED ORDER — TRELEGY ELLIPTA 100-62.5-25 MCG/INH IN AEPB: 1.0000 | INHALATION_SPRAY | Freq: Every day | RESPIRATORY_TRACT | 0 refills | Status: DC

## 2020-02-03 MED ORDER — NICOTINE 14 MG/24HR TD PT24
14.0000 mg | MEDICATED_PATCH | Freq: Every day | TRANSDERMAL | Status: DC
  Administered 2020-02-03 – 2020-02-06 (×4): 14 mg via TRANSDERMAL
  Filled 2020-02-03 (×4): qty 1

## 2020-02-03 MED ORDER — INSULIN GLARGINE 100 UNIT/ML ~~LOC~~ SOLN
5.0000 [IU] | Freq: Every day | SUBCUTANEOUS | Status: DC
  Administered 2020-02-03 – 2020-02-05 (×3): 5 [IU] via SUBCUTANEOUS
  Filled 2020-02-03 (×4): qty 0.05

## 2020-02-03 MED FILL — TRELEGY ELLIPTA 100-62.5-25: 100-62.5-25 | 30 days supply | Qty: 60 | Fill #0

## 2020-02-03 NOTE — Discharge Summary (Addendum)
Name: Julie Kerr MRN: [id number] DOB: 1976-01-18 44 y.o. PCP: Patient, No Pcp Per  Date of Admission: 02/02/2020 12:02 PM Date of Discharge: 02/06/20 Attending Physician: Lucious Groves, DO  Discharge Diagnosis: 1.  Exacerbation of reactive airway disease 2.  Vocal cord dysfunction/Paroxysmal vocal cord motion 3.  Panic Disorder 4.  Hyperglycemia  Discharge Medications: Allergies as of 02/06/2020      Reactions   Cellulose Hives   Robitussin Dm [dextromethorphan-guaifenesin] Nausea And Vomiting   Chocolate Hives   Suboxone [buprenorphine Hcl-naloxone Hcl] Other (See Comments)   Aggressive behavior   Nsaids Hives   Rayon, Purified Hives   Tolmetin Hives   Tramadol Hives, Itching      Medication List    STOP taking these medications   Activase 1 mg/mL injection Generic drug: alteplase   levofloxacin 750 MG tablet Commonly known as: LEVAQUIN   methocarbamol 500 MG tablet Commonly known as: ROBAXIN   morphine 30 MG tablet Commonly known as: MSIR   predniSONE 10 MG (21) Tbpk tablet Commonly known as: STERAPRED UNI-PAK 21 TAB Replaced by: predniSONE 10 MG tablet     TAKE these medications   acetaminophen 325 MG tablet Commonly known as: TYLENOL Take 2 tablets (650 mg total) every 6 (six) hours as needed by mouth for mild pain (or Fever >/= 101).   albuterol 108 (90 Base) MCG/ACT inhaler Commonly known as: VENTOLIN HFA Inhale 2 puffs into the lungs every 6 (six) hours as needed for wheezing or shortness of breath.   ALPRAZolam 0.5 MG tablet Commonly known as: Xanax Take 1 tablet (0.5 mg total) by mouth 3 (three) times daily as needed for sleep or anxiety. Take 1 tablet (0.5 mg) by mouth 3 times daily as needed for panic attacks. What changed:   reasons to take this  additional instructions   fluticasone 50 MCG/ACT nasal spray Commonly known as: Flonase Place 2 sprays into both nostrils 2 (two) times daily.   fluticasone furoate-vilanterol 200-25  MCG/INH Aepb Commonly known as: BREO ELLIPTA Inhale 1 puff into the lungs daily.   gabapentin 300 MG capsule Commonly known as: Neurontin Take 2 capsules (600 mg total) by mouth 2 (two) times daily.   guaiFENesin 600 MG 12 hr tablet Commonly known as: MUCINEX Take 2 tablets (1,200 mg total) 2 (two) times daily by mouth.   ipratropium-albuterol 0.5-2.5 (3) MG/3ML Soln Commonly known as: DUONEB Take 3 mLs by nebulization every 4 (four) hours as needed (shortness of breath/wheezing).   meloxicam 7.5 MG tablet Commonly known as: MOBIC Take 7.5 mg daily by mouth.   Oxycodone HCl 10 MG Tabs Take 10 mg by mouth every 6 (six) hours as needed for pain.   pantoprazole 40 MG tablet Commonly known as: PROTONIX Take 1 tablet (40 mg total) 2 (two) times daily by mouth.   predniSONE 10 MG tablet Commonly known as: DELTASONE Take 4 tablets (40 mg total) by mouth daily for 1 day, THEN 2 tablets (20 mg total) daily for 4 days, THEN 1 tablet (10 mg total) daily for 4 days. Start taking on: February 04, 2020 Replaces: predniSONE 10 MG (21) Tbpk tablet   verapamil 100 MG 24 hr capsule Commonly known as: VERELAN Take 100 mg by mouth at bedtime.   Xtampza ER 13.5 MG C12a Generic drug: oxyCODONE ER Take 13.5 mg by mouth 2 (two) times daily.   zolpidem 10 MG tablet Commonly known as: AMBIEN Take 10 mg by mouth at bedtime. Reported on 12/03/2015  Durable Medical Equipment  (From admission, onward)         Start     Ordered   02/04/20 0000  For home use only DME Nebulizer machine       Question Answer Comment  Patient needs a nebulizer to treat with the following condition Asthma exacerbation   Length of Need Lifetime      02/04/20 1319          Disposition and follow-up:   [name] was discharged from [facility] in Stable condition.  At the hospital follow up visit please address:  1.  Exacerbation of reactive airway disease.  Home  medications prior to admission included albuterol and Breo Ellipta.  Patient was noted to be hypoxic on admission with O2 sats in the low 80s.  She was placed on BiPAP and started on steroids.  She is able to be weaned to room air within 12 hours of admission and was stable on room air at time of discharge --Prednisone taper--4d of 20mg  followed by 4d of 10mg  --Nebulizer machine with duonebs prescribed at discharge as well --Continue albuterol and breo-ellipta -Consider obtaining PFTs for better evaluation of COPD versus asthma  2. Vocal Cord Disorder/Paroxysmal vocal cords. Suspect to be driven by panic disorder. See below.  --consider referral to SLP specialist  3. Severe Panic Disorder. Suspect PTSD. Suspect that this was the primary contributor to her extended hospital stay. She has previously received xanax 0.5mg  for this in the past and has worked well during her hospitalization. --will discharge with 0.5mg  Xanax TID PRN --referral placed to psychiatry for further assistance with management   4.  Hyperglycemia.  Blood sugars were noted to be elevated on admission but CBGs greater than 200.  We did have her on 5U of Lantus and a sliding scale while admitted due to elevated blood sugars in the setting of steroids however I am suspecting that her glucoses are running high at baseline as well. -Obtain A1c at time of follow-up  2.  Labs / imaging needed at time of follow-up: PFTs  3.  Pending labs/ test needing follow-up: None  Follow-up Appointments:  Follow-up Information    , MD. Schedule an appointment as soon as possible for a visit in 5 day(s).   Specialty: Internal Medicine Contact information: [address] [place] [address] [place] [phone]               Hospital Course: Julie Kerr is a 44 year old female with past medical history of asthma and anxiety/panic disorder.  She presented to [facility] emergency department on 02/02/2020 for evaluation of  progressive shortness of breath over the past day.  She had been trying several of her home medications without relief. On presentation, her O2 sats were in the low 80s and she required BiPAP.  She was started on IV steroids, magnesium infusion, and duo nebs.  Over the course of her hospitalization, she quickly recovered and was able to be weaned to room air on hospital day #1. She did have occassional desaturations during coughing spells on hospital day #1 however, so she remained hospitalized for another night. On hospital day #2, as we were preparing for discharge, she developed progressive respiratory distress and discharge had to be delayed. This seemed to be more related to her upper airway process and a panic attack.  She remained hospitalized and was evaluated by ENT due to the stridor she exhibited during these attacks. They agreed that this was mostly  panic driven. CT neck was also obtained and did not reveal any tracheal narrowing or other etiologies. On day of discharge, her symptoms had improved quite significantly. She had   Discharge Vitals:   BP 107/61 (BP Location: Left Arm)   Pulse 97   Temp 98.8 F (37.1 C) (Oral)   Resp 17   Ht 5\' 5"  (1.651 m)   Wt 84 kg   LMP 02/02/2020   SpO2 99%   BMI 30.82 kg/m   Pertinent Labs, Studies, and Procedures:  Chest x-ray: No acute process CBC Latest Ref Rng & Units 02/03/2020 02/02/2020 02/02/2020  WBC 4.0 - 10.5 K/uL 14.9(H) - 10.1  Hemoglobin 12.0 - 15.0 g/dL 11.1(L) 13.9 12.4  Hematocrit 36 - 46 % 35.4(L) 41.0 40.3  Platelets 150 - 400 K/uL 286 - 346   BMP Latest Ref Rng & Units 02/03/2020 02/02/2020 02/02/2020  Glucose 70 - 99 mg/dL 02/04/2020) - 409(W)  BUN 6 - 20 mg/dL 12 - 14  Creatinine 119(J - 1.00 mg/dL 4.78 - 2.95)  Sodium 135 - 145 mmol/L 137 143 141  Potassium 3.5 - 5.1 mmol/L 5.1 3.9 4.5  Chloride 98 - 111 mmol/L 107 - 110  CO2 22 - 32 mmol/L 22 - 22  Calcium 8.9 - 10.3 mg/dL 9.2 - 9.4    Discharge Instructions     Ambulatory referral to Psychiatry   Complete by: As directed    Severe panic disorder and likely PTSD   For home use only DME Nebulizer machine   Complete by: As directed    Patient needs a nebulizer to treat with the following condition: Asthma exacerbation   Length of Need: Lifetime      Signed: 6.21(H, MD 02/06/2020, 11:28 AM   Pager: [phone]

## 2020-02-03 NOTE — Progress Notes (Addendum)
NAME:  Julie Kerr, MRN:  [id number], DOB:  Oct 07, 1975, LOS: 1 ADMISSION DATE:  02/02/2020   Brief History  44 yo female with asthma and history of polysubstance use who presented to [facility] on 02/02/20 for acute onset shortness of breath not relieved by home asthma medication use. On presentation, O2 sats were in the high 80s. While in the ED, she was placed on BiPAP and given solumedrol, Mg, epi, and nebs.  Workup in the ED was unrevealing. Pt was also very anxious about BiPAP and was given ativan however anxiety did not improve. Patient requested ketamine to improve sx.  Subjective  No overnight events Last dose of ativan at 2030 last pm.    Patient evaluated at bedside this morning. During evaluation, had multiple coughing spells. She notes prior history of multiple intubations for asthma exacerbations. She has had trach before as well couple years ago.  Significant Hospital Events   6/20 admission  6/21 weaned down to room air but still having severe coughing  Objective   Blood pressure (!) 108/58, pulse (!) 104, temperature 98.9 F (37.2 C), temperature source Oral, resp. rate 20, height 5\' 5"  (1.651 m), weight 84 kg, last menstrual period 02/02/2020, SpO2 100 %.     Intake/Output Summary (Last 24 hours) at 02/03/2020 0500 Last data filed at 02/02/2020 1313 Gross per 24 hour  Intake 50 ml  Output --  Net 50 ml   Filed Weights   02/02/20 1255  Weight: 84 kg    Examination: GENERAL: in no acute distress CARDIAC: heart RRR.  PULMONARY: gross auditory wheezes appreciated interspersed with non-productive coughing spells. Appreciable tracheal stridor. Poor lower airway movement  Skin: no rash  Consults:  none  Significant Diagnostic Tests:  6/30 CXR>unremarkable  Micro Data:  n/a  Antimicrobials:  n/a  Summary  44 yo female admitted to IMTS on 02/02/20 for reactive airway exacerbation complicated by anxiety/panic disorder. She required BiPAP on admission however was  able to be weaned to room air overnight. Due to extensive coughing episodes with intermittent desats, we are going to continue her steroids and breathing treatments for today and re-evaluate her tomorrow.  Assessment & Plan:  Principal Problem:   Asthma exacerbation Active Problems:   Generalized anxiety disorder   Panic attacks   Vocal cord dysfunction   Obesity (BMI 30.0-34.9)  (Resolved) Acute hypoxic respiratory failure requiring NIPV--Initially required BiPAP however has been weaned to room air overnight Reactive airway disease exacerbation--asthma vs COPD vs laryngeal dysfunction.  Trigger unclear. No significant findings on admission CXR or ABG. Pt noting laryngeal dysfunction occurred after extended period of intubation 2 years ago at which time she required tracheostomy Plan Will continue extended prednisone taper--40mg  x4d followed by 20mg  x4d followed by 10mg  4d.  Scheduled duonebs  Reactive leukocytosis 2/2 steroid. White count normal on admission.   Steroid induced hyperglycemia: blood sugars are continuing to run a little elevated. Will add lantus 5U daily.  Anemia--borderline microcytic. Suspect component of ACD. Outpatient monitoring and further evaluation per PCP.  CHRONIC MEDICAL CONDITIONS #Anxiety/panic disorder: will try to avoid further sedating medications however please notify primary team if anxiety worsens #Insomnia--continue home dose ambien #Chronic back pain--continue oxycodone and gabapentin #Tobacco use disorder--discussed importance of smoking cessation, especially in the setting of severe airway disease. Order placed for nicotine patch at this time.  Best practice:  CODE STATUS: Full Diet: regular. NPO while on BiPAP DVT for prophylaxis: lovenox Social considerations/Family communication: per pt Dispo: suspect d/c in 1-2d  Mitzi Hansen, MD INTERNAL MEDICINE RESIDENT PGY-1 Contact info  --Please page me at [phone] from 7am-5pm M-F.    --Please use the IMTS our after hours pager at [phone] after 5pm M-F and on weekends 02/03/20  5:00 AM   Labs    CBC Latest Ref Rng & Units 02/03/2020 02/02/2020 02/02/2020  WBC 4.0 - 10.5 K/uL 14.9(H) - 10.1  Hemoglobin 12.0 - 15.0 g/dL 11.1(L) 13.9 12.4  Hematocrit 36 - 46 % 35.4(L) 41.0 40.3  Platelets 150 - 400 K/uL 286 - 346   BMP Latest Ref Rng & Units 02/03/2020 02/02/2020 02/02/2020  Glucose 70 - 99 mg/dL 193(H) - 210(H)  BUN 6 - 20 mg/dL 12 - 14  Creatinine 0.44 - 1.00 mg/dL 0.81 - 1.02(H)  Sodium 135 - 145 mmol/L 137 143 141  Potassium 3.5 - 5.1 mmol/L 5.1 3.9 4.5  Chloride 98 - 111 mmol/L 107 - 110  CO2 22 - 32 mmol/L 22 - 22  Calcium 8.9 - 10.3 mg/dL 9.2 - 9.4

## 2020-02-03 NOTE — Progress Notes (Signed)
Pt having panic attack. On call doctor notified. Anxiety meds ordered

## 2020-02-03 NOTE — Progress Notes (Signed)
Internal Medicine Attending:   I saw and examined the patient. I reviewed Dr Luciana Axe note and I agree with the resident's findings and plan as documented in the resident's note.  Overall this is a 44 year old female with past medical history of chronic pain syndrome on opioids, asthma with a history of multiple severe exacerbations requiring intubation and history of tracheostomy, history of vocal cord dysfunction who presented for evaluation of dyspnea.  She was found to be hypoxic and treated in the emergency department with nebulizers steroids and BiPAP.  This morning on my evaluation she is now resting comfortably off oxygen.  She does have a couple episodes of coughing fits with audible wheezing.  On exam obese female no distress, good air movement to lungs, mild expiratory wheezing, louder wheezing over neck/vocal cords, heart regular rate and rhythm, abdomen soft nontender, no lower extremity edema.  Assessment plan Acute hypoxic respiratory failure secondary to asthma exacerbation versus vocal cord dysfunction -I suspect this is a combination of asthma exacerbation and vocal cord dysfunction.  We will continue nebulizers and steroids.  We will plan a 12-day taper.  I discussed some of the vocal cord dysfunction exercises, she reports she is familiar with them.  Anxiety, chronic pain syndrome, polypharmacy -Monitor, use caution  Anticipate she will need another day of inpatient care due to dropping O2 saturations during coughing/wheezing episodes.

## 2020-02-04 DIAGNOSIS — K429 Umbilical hernia without obstruction or gangrene: Secondary | ICD-10-CM

## 2020-02-04 DIAGNOSIS — F410 Panic disorder [episodic paroxysmal anxiety] without agoraphobia: Secondary | ICD-10-CM

## 2020-02-04 LAB — BLOOD GAS, ARTERIAL
Acid-Base Excess: 0.9 mmol/L (ref 0.0–2.0)
Bicarbonate: 25.8 mmol/L (ref 20.0–28.0)
Drawn by: 535471
FIO2: 52
O2 Saturation: 98.8 %
Patient temperature: 36.9
pCO2 arterial: 46.9 mmHg (ref 32.0–48.0)
pH, Arterial: 7.359 (ref 7.350–7.450)
pO2, Arterial: 135 mmHg — ABNORMAL HIGH (ref 83.0–108.0)

## 2020-02-04 LAB — GLUCOSE, CAPILLARY
Glucose-Capillary: 121 mg/dL — ABNORMAL HIGH (ref 70–99)
Glucose-Capillary: 134 mg/dL — ABNORMAL HIGH (ref 70–99)
Glucose-Capillary: 167 mg/dL — ABNORMAL HIGH (ref 70–99)
Glucose-Capillary: 203 mg/dL — ABNORMAL HIGH (ref 70–99)

## 2020-02-04 MED ORDER — RACEPINEPHRINE HCL 2.25 % IN NEBU
INHALATION_SOLUTION | RESPIRATORY_TRACT | Status: AC
  Administered 2020-02-04: 0.5 mL
  Filled 2020-02-04: qty 0.5

## 2020-02-04 MED ORDER — LORAZEPAM 2 MG/ML IJ SOLN
0.5000 mg | INTRAMUSCULAR | Status: DC | PRN
  Administered 2020-02-05: 1 mg via INTRAVENOUS
  Filled 2020-02-04: qty 1

## 2020-02-04 MED ORDER — IPRATROPIUM-ALBUTEROL 0.5-2.5 (3) MG/3ML IN SOLN: 3.0000 mL | RESPIRATORY_TRACT | 0 refills | Status: DC | PRN

## 2020-02-04 MED ORDER — ALPRAZOLAM 0.5 MG PO TABS
0.5000 mg | ORAL_TABLET | Freq: Three times a day (TID) | ORAL | Status: DC | PRN
  Administered 2020-02-05 – 2020-02-06 (×4): 0.5 mg via ORAL
  Filled 2020-02-04 (×5): qty 1

## 2020-02-04 MED ORDER — WHITE PETROLATUM EX OINT
TOPICAL_OINTMENT | CUTANEOUS | Status: DC | PRN
  Administered 2020-02-04: 0.2 via TOPICAL
  Filled 2020-02-04: qty 28.35

## 2020-02-04 MED ORDER — LORAZEPAM 2 MG/ML IJ SOLN: 1.0000 mg | Freq: Once | INTRAMUSCULAR | Status: DC

## 2020-02-04 MED ORDER — LORAZEPAM 2 MG/ML IJ SOLN
INTRAMUSCULAR | Status: AC
  Filled 2020-02-04: qty 1

## 2020-02-04 MED ORDER — PREDNISONE 10 MG PO TABS: ORAL_TABLET | ORAL | 0 refills | Status: AC

## 2020-02-04 MED ORDER — ALBUTEROL (5 MG/ML) CONTINUOUS INHALATION SOLN
5.0000 mg/h | INHALATION_SOLUTION | RESPIRATORY_TRACT | Status: AC
  Filled 2020-02-04: qty 20

## 2020-02-04 MED ORDER — LORAZEPAM 2 MG/ML IJ SOLN
1.0000 mg | Freq: Once | INTRAMUSCULAR | Status: AC
  Administered 2020-02-04: 1 mg via INTRAVENOUS

## 2020-02-04 NOTE — Progress Notes (Signed)
NAME:  Julie Kerr, MRN:  [id number], DOB:  1975/09/11, LOS: 2 ADMISSION DATE:  02/02/2020   Brief History  44 yo female with asthma and history of polysubstance use who presented to [facility] on 02/02/20 for acute onset shortness of breath not relieved by home asthma medication use. On presentation, O2 sats were in the high 80s. While in the ED, she was placed on BiPAP and given solumedrol, Mg, epi, and nebs.  Workup in the ED was unrevealing. Pt was also very anxious about BiPAP and was given ativan however anxiety did not improve. Patient requested ketamine to improve sx.  Subjective  Panic attack overnight. Otherwise, no significant events. Pt noting that her umbilical hernia is somewhat painful from the coughing. Discussed concerning s/s to watch for and that it should improve with improvement in cough.  Significant Hospital Events   6/20 admission  6/21 weaned down to room air but still having severe coughing  Objective   Blood pressure 137/78, pulse 99, temperature 98.4 F (36.9 C), temperature source Oral, resp. rate 17, height 5\' 5"  (1.651 m), weight 84 kg, last menstrual period 02/02/2020, SpO2 99 %.     Intake/Output Summary (Last 24 hours) at 02/04/2020 0507 Last data filed at 02/04/2020 0400 Gross per 24 hour  Intake 960 ml  Output 800 ml  Net 160 ml   Filed Weights   02/02/20 1255  Weight: 84 kg    Examination: GENERAL: in no acute distress CARDIAC: tachycardic PULMONARY: breathing comfortably on room air. Intermittent dry coughing spells throughout our visit this morning but maintaining O2 sats >96% during those episodes. still has somewhat poor air movement with expiratory wheezes and upper airway sounds. Abd: mildly distended. Umbilical hernia soft, non-erythematous. Pain with palpation throughout abd.  Consults:  none  Significant Diagnostic Tests:  6/30 CXR>unremarkable  Micro Data:  n/a  Antimicrobials:  n/a  Summary  44 yo female admitted to IMTS on  02/02/20 for reactive airway exacerbation complicated by anxiety/panic disorder. She required BiPAP on admission however was able to be weaned to room air overnight. She remains on room air this morning and I can see some improvement although it remains clear that she is in an exacerbation. We will obtain ambulatory O2 sats today and if able to maintain saturation levels, will progress towards discharge today.  Assessment & Plan:  Principal Problem:   Asthma exacerbation Active Problems:   Generalized anxiety disorder   Panic attacks   Vocal cord dysfunction   Obesity (BMI 30.0-34.9)  (Resolved) Acute hypoxic respiratory failure requiring NIPV Reactive airway disease exacerbation--asthma vs COPD vs laryngeal dysfunction.  Remains on room air Leukocytosis 2/2 steroid Plan Will continue extended prednisone taper--40mg  x4d followed by 20mg  x4d followed by 10mg  4d.  Scheduled duonebs Ambulatory O2 sats Mucinex prn  Steroid induced hyperglycemia: improved from yesterday. Continue 5U lantus and SSI  Umbilical hernia: pt noting this as a chronic issue but is being hernia is being aggravated by coughing episodes. No concerning features on exam today. Unable to reduce due to discomfort however discussed s/s to watch out for with pt and she relayed understanding.  CHRONIC MEDICAL CONDITIONS #Anxiety/panic disorder: prn xanax tid (home med) #Insomnia--continue ambien #Chronic back pain--continue oxycodone and gabapentin #Tobacco use disorder--discussed importance of smoking cessation, especially in the setting of severe airway disease. Order placed for nicotine patch at this time. #Anemia--borderline microcytic. Suspect component of ACD. Outpatient monitoring and further evaluation per PCP.  Best practice:  CODE STATUS: Full Diet: regular.  DVT for prophylaxis: lovenox Social considerations/Family communication: per pt Dispo: suspect d/c in 0-1d pending maintaining O2 sats with ambulation     [name], MD INTERNAL MEDICINE RESIDENT PGY-1 Contact info  --Please page me at [phone] from 7am-5pm M-F.  --Please use the IMTS our after hours pager at [phone] after 5pm M-F and on weekends 02/04/20  5:07 AM   Labs    CBC Latest Ref Rng & Units 02/03/2020 02/02/2020 02/02/2020  WBC 4.0 - 10.5 K/uL 14.9(H) - 10.1  Hemoglobin 12.0 - 15.0 g/dL 11.1(L) 13.9 12.4  Hematocrit 36 - 46 % 35.4(L) 41.0 40.3  Platelets 150 - 400 K/uL 286 - 346   BMP Latest Ref Rng & Units 02/03/2020 02/02/2020 02/02/2020  Glucose 70 - 99 mg/dL 848(T) - 927(G)  BUN 6 - 20 mg/dL 12 - 14  Creatinine 3.94 - 1.00 mg/dL 3.20 - 0.37(D)  Sodium 135 - 145 mmol/L 137 143 141  Potassium 3.5 - 5.1 mmol/L 5.1 3.9 4.5  Chloride 98 - 111 mmol/L 107 - 110  CO2 22 - 32 mmol/L 22 - 22  Calcium 8.9 - 10.3 mg/dL 9.2 - 9.4

## 2020-02-04 NOTE — TOC Progression Note (Signed)
Transition of Care [facility]) - Progression Note    Patient Details  Name: Julie Kerr MRN: [id number] Date of Birth: Dec 21, 1975  Transition of Care [facility]) CM/SW [place], [place] Phone Number: 02/04/2020, 2:39 PM  Clinical Narrative:     CSW met with pt regarding need for Nebulizer DME. Pt agreeable with no preference in company. Nebulizer arranged with Jermaine from [employer].    Expected Discharge Plan: Home/Self Care Barriers to Discharge: No Barriers Identified  Expected Discharge Plan and Services Expected Discharge Plan: Home/Self Care         Expected Discharge Date: 02/03/20               DME Arranged: Nebulizer/meds DME Agency:  [facility]) Date DME Agency Contacted: 02/04/20 Time DME Agency Contacted: [phone] Representative spoke with at DME Agency: [place] Determinants of Health ([place]) Interventions    Readmission Risk Interventions No flowsheet data found.

## 2020-02-04 NOTE — Progress Notes (Addendum)
EVENT NOTE:  Notified of patient ambulatory saturations being maintained >94%.  Unfortunately, shortly after pt had returned to bed, pt developed progressive shortness of breath and I was paged for re-evaluation. She received another albuterol neb prior to me arriving.  On entering the room, she is displaying increased work of breathing with accessory muscle use and stridor. O2 sats are staying >94%. She is tachycardic but other VSS. On exam, she does have some lower respiratory wheezes but sounds are primarily upper airway. She was able to sit up in bed and move around and was mentating appropriately.   Assessment/Plan: Her exam is seeming primarily upper airway sounding which is consistent with her prior vocal cord dysfunction diagnosis. Although I do believe that she also has a lower reactive airway disease exacerbation, I'm thinking that the respiratory distress is primarily driven by her VCD. Heliox is a known treatment for exacerbations of this however, after speaking with RT, they are noting that they do not provide this as treatment in adults. Unfortunately, I do not think the duoneb or albuterol neb treatments will be as helpful for this. As said above, she is mentating well at this time and O2 sats are ok so I do not think she needs intubation at this time but will need to be watched closely. Will continue with albuterol nebs for now and reach out to PCCM for other suggestions for VCD.  [name], MD 02/04/20 3:20 PM   Addendum #1: Returned to pt's room to re-eval her breathing. Minimal improvement noted in respiratory status. 1mg  ativan given.  Mentation and O2 sats remain stable. Currently on 8L supplemental O2. HR improved 140s>110s. Stridor improved and appearing more comfortable. ABG showing pH 7.36, CO2 47, bicarb 26. Given her chronic COPD and chart review, suspect that her baseline CO2 is around 40-42  Plan: Will continue to monitor at this time. Will place an order for PRN  IV 0.5-1mg  ativan q2h and PRN BiPAP. Suspect she should be able to tolerate the ativan and xanax given her chronic use and likely high tolerance.   , MD. 02/04/20 4:28 PM

## 2020-02-04 NOTE — Progress Notes (Signed)
SATURATION QUALIFICATIONS: (This note is used to comply with regulatory documentation for home oxygen)  Patient Saturations on Room Air at Rest = 100%  Patient Saturations on Room Air while Ambulating = 98% 

## 2020-02-04 NOTE — Progress Notes (Signed)
Patient refused Bipap due to anxiety issues.

## 2020-02-04 NOTE — Progress Notes (Signed)
Upon entering patients room she has audible wheezing/stidor heard from the doorway. Notified respiratory who came to bedside and administered prn neb. MD also came to bedside and assessed patient, placed new order for continuous albuterol tx. Patient is anxious, administered prn xanax as ordered. Will continue to monitor.

## 2020-02-05 ENCOUNTER — Inpatient Hospital Stay (HOSPITAL_COMMUNITY): Payer: 59

## 2020-02-05 LAB — GLUCOSE, CAPILLARY
Glucose-Capillary: 100 mg/dL — ABNORMAL HIGH (ref 70–99)
Glucose-Capillary: 116 mg/dL — ABNORMAL HIGH (ref 70–99)
Glucose-Capillary: 153 mg/dL — ABNORMAL HIGH (ref 70–99)
Glucose-Capillary: 146 mg/dL — ABNORMAL HIGH (ref 70–99)

## 2020-02-05 MED ORDER — OXYMETAZOLINE HCL 0.05 % NA SOLN
1.0000 | Freq: Once | NASAL | Status: DC | PRN
  Filled 2020-02-05: qty 30

## 2020-02-05 MED ORDER — LIDOCAINE-EPINEPHRINE (PF) 1 %-1:200000 IJ SOLN
0.0000 mL | Freq: Once | INTRAMUSCULAR | Status: DC | PRN
  Filled 2020-02-05: qty 30

## 2020-02-05 MED ORDER — LORAZEPAM 2 MG/ML IJ SOLN
INTRAMUSCULAR | Status: AC
  Filled 2020-02-05: qty 1

## 2020-02-05 MED ORDER — LORAZEPAM 2 MG/ML IJ SOLN
1.0000 mg | Freq: Once | INTRAMUSCULAR | Status: AC
  Administered 2020-02-05: 1 mg via INTRAVENOUS

## 2020-02-05 MED ORDER — LIDOCAINE HCL 4 % EX SOLN
0.0000 mL | Freq: Once | CUTANEOUS | Status: DC | PRN
  Filled 2020-02-05: qty 50

## 2020-02-05 MED ORDER — SILVER NITRATE-POT NITRATE 75-25 % EX MISC
1.0000 | Freq: Once | CUTANEOUS | Status: DC | PRN
  Filled 2020-02-05: qty 1

## 2020-02-05 MED ORDER — TRIPLE ANTIBIOTIC 3.5-400-5000 EX OINT
1.0000 "application " | TOPICAL_OINTMENT | Freq: Once | CUTANEOUS | Status: DC | PRN
  Filled 2020-02-05: qty 1

## 2020-02-05 MED ORDER — LIDOCAINE HCL 2 % EX GEL
1.0000 "application " | Freq: Once | CUTANEOUS | Status: DC | PRN
  Filled 2020-02-05: qty 1

## 2020-02-05 NOTE — Progress Notes (Signed)
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 There are no concerns for impact on swallowing at this time. Pt previously educated on "sniff/hiss" breathing pattern, which was formerly helpful for her. Recommend further treatment as an outpatient for PVFM.   Reason Eval/Treat Not Completed: SLP screened, no acute needs identified, will sign off  Tytiana Coles P. Charleigh Correnti, M.S., [name] Speech-Language Pathologist Acute Rehabilitation Services Pager: [phone]  Susanne Borders Seger Jani 02/05/2020, 2:35 PM

## 2020-02-05 NOTE — Progress Notes (Signed)
NAME:  Julie Kerr, MRN:  [id number], DOB:  September 03, 1975, LOS: 3 ADMISSION DATE:  02/02/2020   Brief History  44 yo female with asthma and history of polysubstance use who presented to [facility] on 02/02/20 for acute onset shortness of breath not relieved by home asthma medication use. On presentation, O2 sats were in the high 80s. While in the ED, she was placed on BiPAP and given solumedrol, Mg, epi, and nebs.  Workup in the ED was unrevealing. Pt was also very anxious about BiPAP and was given ativan however anxiety did not improve. Patient requested ketamine to improve sx.  Subjective/Interm hx  Was planning for discharge last afternoon however prior to leaving, she developed severe stridor that I am thinking was brought on by a panic attack that was worsened by her VCD. Symptoms improved after 1mg  of IV ativan. See my event note for more details  She was examined and evaluated at bedside this am. She mentions having poor night of sleep due to continued coughing and respiratory distress. She mentions also endorsing some hemoptysis, was informed it was due to frequent coughs. Mentions continuing to have wheezing with cough this morning.  Significant Hospital Events   6/20 admission  6/21 weaned down to room air but still having severe coughing 6/22 discharge delayed due to severe stridor 6/23 obtaining ENT consult  Objective   Blood pressure 117/64, pulse 97, temperature 98.3 F (36.8 C), temperature source Oral, resp. rate 17, height 5\' 5"  (1.651 m), weight 84 kg, last menstrual period 02/02/2020, SpO2 99 %.     Intake/Output Summary (Last 24 hours) at 02/05/2020 0505 Last data filed at 02/05/2020 0045 Gross per 24 hour  Intake 240 ml  Output 800 ml  Net -560 ml   Filed Weights   02/02/20 1255  Weight: 84 kg    Examination: GENERAL: in no acute distress CARDIAC: tachycardic PULMONARY: breathing fairly comfortably on room air with O2 sats>96%. Mild-moderate expiratory wheezes  appreciated. Inspiratory and expiratory upper airway sounds.  Consults:  ENT  Significant Diagnostic Tests:  6/30 CXR>unremarkable  Micro Data:  n/a  Antimicrobials:  n/a  Summary  44 yo female admitted to IMTS on 02/02/20 for reactive airway exacerbation complicated by anxiety/panic disorder and vocal cord dysfunction. She required BiPAP on admission however was able to be weaned to room air overnight. Her lower airway sounds have improved however her discharge has been delayed due to panic disorder and vocal cord dysfunction which I suspect are feeding off each other.  Due to her history of repeat intubations and tracheostomy, I am consulting ENT and obtaining a neck CT to rule out structural abnormalities. I have also consulted SLP for education on exercises that may prove beneficial.  Assessment & Plan:  Principal Problem:   Asthma exacerbation Active Problems:   Generalized anxiety disorder   Panic attacks   Vocal cord dysfunction   Obesity (BMI 30.0-34.9)  (Resolved) Acute hypoxic respiratory failure requiring NIPV Reactive airway disease exacerbation--asthma vs COPD  Vocal cord dysfunction   Remains on room air Remains afebrile Tachycardia improving Plan ENT consult SLP  Neck CT for evaluation of structural abnormalities Will continue extended prednisone taper--40mg  x4d followed by 20mg  x4d followed by 10mg  4d.  Scheduled duonebs Mucinex prn  Anxiety/panic disorder. I do question if her symptoms are not driven by panic attacks that are significantly worsened due to her VCD. Pt notes having experienced some self mutilation with buproprion and implied some other unfavorable effects from SSRIs unfortunately.  I would strongly encourage her to continue to follow with psychiatry to see if they are able to assist her in obtaining better control of her sx.  Although secondary gain with receiving IV ativan is certainly a consideration, I do think these are true panic attacks  she is experiencing. Plan: continue prn xanax tid (home med). Chart review also indicates that symptoms had improved at higher doses so can consider increases dose however, would be somewhat hesitant to do this given her other controlled substances already.  Steroid induced hyperglycemia: Continue 5U lantus and SSI  CHRONIC MEDICAL CONDITIONS #Insomnia--continue ambien #Chronic back pain--continue oxycodone and gabapentin #Tobacco use disorder--discussed importance of smoking cessation, especially in the setting of severe airway disease. Order placed for nicotine patch at this time. #Anemia--borderline microcytic. Suspect component of ACD. Outpatient monitoring and further evaluation per PCP. #Umbilical hernia  Best practice:  CODE STATUS: Full Diet: regular.  DVT for prophylaxis: lovenox Social considerations/Family communication: per pt Dispo: pending neck CT and ENT eval    [name], MD INTERNAL MEDICINE RESIDENT PGY-1 Contact info  --Please page me at [phone] from 7am-5pm M-F.  --Please use the IMTS our after hours pager at [phone] after 5pm M-F and on weekends 02/05/20  5:05 AM   Labs    CBC Latest Ref Rng & Units 02/03/2020 02/02/2020 02/02/2020  WBC 4.0 - 10.5 K/uL 14.9(H) - 10.1  Hemoglobin 12.0 - 15.0 g/dL 11.1(L) 13.9 12.4  Hematocrit 36 - 46 % 35.4(L) 41.0 40.3  Platelets 150 - 400 K/uL 286 - 346   BMP Latest Ref Rng & Units 02/03/2020 02/02/2020 02/02/2020  Glucose 70 - 99 mg/dL 428(J) - 681(L)  BUN 6 - 20 mg/dL 12 - 14  Creatinine 5.72 - 1.00 mg/dL 6.20 - 3.55(H)  Sodium 135 - 145 mmol/L 137 143 141  Potassium 3.5 - 5.1 mmol/L 5.1 3.9 4.5  Chloride 98 - 111 mmol/L 107 - 110  CO2 22 - 32 mmol/L 22 - 22  Calcium 8.9 - 10.3 mg/dL 9.2 - 9.4
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
## 2020-02-06 ENCOUNTER — Telehealth: Payer: Self-pay | Admitting: *Deleted

## 2020-02-06 LAB — GLUCOSE, CAPILLARY: Glucose-Capillary: 118 mg/dL — ABNORMAL HIGH (ref 70–99)

## 2020-02-06 MED ORDER — ALPRAZOLAM 0.5 MG PO TABS: 0.5000 mg | ORAL_TABLET | Freq: Three times a day (TID) | ORAL | 0 refills | Status: DC | PRN

## 2020-02-06 MED ORDER — FLUTICASONE FUROATE-VILANTEROL 200-25 MCG/INH IN AEPB: 1.0000 | INHALATION_SPRAY | Freq: Every day | RESPIRATORY_TRACT | 0 refills | Status: DC

## 2020-02-06 MED FILL — BREO ELLIPTA 200-25 MCG INH: 200-25 | 30 days supply | Qty: 60 | Fill #0

## 2020-02-06 NOTE — Progress Notes (Signed)
NAME:  Julie Kerr, MRN:  [id number], DOB:  1976/01/08, LOS: 4 ADMISSION DATE:  02/02/2020   Brief History  44 yo female with asthma and history of polysubstance use who presented to [facility] on 02/02/20 for acute onset shortness of breath not relieved by home asthma medication use. On presentation, O2 sats were in the high 80s. While in the ED, she was placed on BiPAP and given solumedrol, Mg, epi, and nebs.  Workup in the ED was unrevealing. Pt was also very anxious about BiPAP and was given ativan however anxiety did not improve. Patient requested ketamine to improve sx.  Subjective/Interm hx  No overnight events No IV ativan requirement overnight Pt noting that she is doing quite a bit better from a respiratory standpoint. She is also noting that she has HH nursing 2h/day 7d/week.  We discussed how much of this is related to severe panic disorder/attacks which she seems to understand pretty well. She notes that she has not seen psychiatry in the past and we both agreed that this would be very beneficial given the severity of her panic disorder and history of self mutilation with some antidepressants in the past.  Significant Hospital Events   6/20 admission  6/21 weaned down to room air but still having severe coughing 6/22 discharge delayed due to severe stridor 6/23 obtaining ENT consult 6/24 discharge  Objective   Blood pressure 107/61, pulse 95, temperature 97.9 F (36.6 C), temperature source Oral, resp. rate 18, height 5\' 5"  (1.651 m), weight 84 kg, last menstrual period 02/02/2020, SpO2 97 %.     Intake/Output Summary (Last 24 hours) at 02/06/2020 0512 Last data filed at 02/06/2020 0507 Gross per 24 hour  Intake 480 ml  Output 2300 ml  Net -1820 ml   Filed Weights   02/02/20 1255  Weight: 84 kg    Examination: GENERAL: in no acute distress CARDIAC: RRR PULMONARY: breathing comfortably on room air. No stridor appreciated this morning. Lung sounds are much improved from  yesterday and she is only having mild upper respiratory sounds this morning  Consults:  ENT  Significant Diagnostic Tests:  6/20 CXR>unremarkable 6/23 CT neck> no abnormalities  Micro Data:  n/a  Antimicrobials:  n/a  Summary  44 yo female admitted to [facility] on 02/02/20 for reactive airway exacerbation complicated by anxiety/panic disorder and vocal cord dysfunction. She required BiPAP on admission however was able to be weaned to room air overnight. Her lower airway sounds have improved however her discharge has been delayed due to panic disorder and vocal cord dysfunction which I suspect are feeding off each other.  She sounds significant better this morning and she seems understanding of how the panic disorder is affecting her paradoxical vocal cord issues. She feels ready for discharge and has not required any IV ativan overnight.   Assessment & Plan:  Principal Problem:   Asthma exacerbation Active Problems:   Generalized anxiety disorder   Panic attacks   Vocal cord dysfunction   Obesity (BMI 30.0-34.9)  Medically stable for discharge. Please see Discharge summary for complete discharge plan.  (Resolved) Acute hypoxic respiratory failure requiring NIPV Reactive airway disease exacerbation--asthma vs COPD--improving Vocal cord dysfunction/Paroxysmal vocal cord movement--significantly improved this morning.  No abnormalities on neck CT  ENT consulted 6/23--no specific recs offered--agreeing that this is driven by her severe panic disorder I also spoke with SLP yesterday who notes that the inpatient SLPs are not trained for the paroxysmal vocal cord movement disorders however she may benefit  from an outpatient eval  Panic disorder. Likely driving factor of her respiratory sx Plan: continue prn xanax tid (home med).    CHRONIC MEDICAL CONDITIONS #Insomnia--continue ambien #Chronic back pain--continue oxycodone and gabapentin #Tobacco use disorder--discussed importance of  smoking cessation, especially in the setting of severe airway disease. Order placed for nicotine patch at this time. #Anemia--borderline microcytic. Suspect component of ACD. Outpatient monitoring and further evaluation per PCP. #Umbilical hernia  Best practice:  CODE STATUS: Full Diet: regular.  DVT for prophylaxis: lovenox Social considerations/Family communication: per pt Dispo: discharge today    Mitzi Hansen, MD [facility] PGY-1 Contact info  --Please page me at [phone] from 7am-5pm M-F.  --Please use the [facility] our after hours pager at [phone] after 5pm M-F and on weekends 02/06/20  5:12 AM

## 2020-02-06 NOTE — Care Management Important Message (Signed)
Important Message  Patient Details  Name: Julie Kerr MRN: [id number] Date of Birth: 1975/09/20   Medicare Important Message Given:        [name] 02/06/2020, 11:48 AM

## 2020-02-06 NOTE — Telephone Encounter (Signed)
Call from [place], [facility] pharmacy - stated pt had 2 rxs for Xanax that were not signed; she left and came back with them signed.  I  called Dr Ephriam Knuckles - she stated pt brought rx back and she signed 1 rx for Xanax #60 tabs. Called Abby back - informed Dr Ephriam Knuckles stated pt did bring rx back and she signed one.

## 2020-08-05 ENCOUNTER — Emergency Department (HOSPITAL_COMMUNITY)
Admission: EM | Admit: 2020-08-05 | Discharge: 2020-08-05 | Disposition: A | Discharge status: Home / Self Care | Payer: 59 | Attending: Emergency Medicine | Admitting: Emergency Medicine

## 2020-08-05 ENCOUNTER — Encounter (HOSPITAL_COMMUNITY): Payer: Self-pay | Admitting: Emergency Medicine

## 2020-08-05 ENCOUNTER — Emergency Department (HOSPITAL_COMMUNITY): Payer: 59

## 2020-08-05 DIAGNOSIS — Z20822 Contact with and (suspected) exposure to covid-19: Secondary | ICD-10-CM | POA: Diagnosis not present

## 2020-08-05 DIAGNOSIS — R0602 Shortness of breath: Secondary | ICD-10-CM | POA: Diagnosis present

## 2020-08-05 DIAGNOSIS — J4541 Moderate persistent asthma with (acute) exacerbation: Secondary | ICD-10-CM | POA: Insufficient documentation

## 2020-08-05 DIAGNOSIS — R000 Tachycardia, unspecified: Secondary | ICD-10-CM | POA: Diagnosis not present

## 2020-08-05 LAB — CBC WITH DIFFERENTIAL/PLATELET
Abs Immature Granulocytes: 0.02 10*3/uL (ref 0.00–0.07)
Basophils Absolute: 0 10*3/uL (ref 0.0–0.1)
Basophils Relative: 0 %
Eosinophils Absolute: 0.1 10*3/uL (ref 0.0–0.5)
Eosinophils Relative: 1 %
HCT: 40.3 % (ref 36.0–46.0)
Hemoglobin: 12.3 g/dL (ref 12.0–15.0)
Immature Granulocytes: 0 %
Lymphocytes Relative: 29 %
Lymphs Abs: 2 10*3/uL (ref 0.7–4.0)
MCH: 25.3 pg — ABNORMAL LOW (ref 26.0–34.0)
MCHC: 30.5 g/dL (ref 30.0–36.0)
MCV: 82.8 fL (ref 80.0–100.0)
Monocytes Absolute: 0.4 10*3/uL (ref 0.1–1.0)
Monocytes Relative: 6 %
Neutro Abs: 4.4 10*3/uL (ref 1.7–7.7)
Neutrophils Relative %: 64 %
Platelets: 289 10*3/uL (ref 150–400)
RBC: 4.87 MIL/uL (ref 3.87–5.11)
RDW: 15.2 % (ref 11.5–15.5)
WBC: 6.9 10*3/uL (ref 4.0–10.5)
nRBC: 0 % (ref 0.0–0.2)

## 2020-08-05 LAB — RESP PANEL BY RT-PCR (FLU A&B, COVID) ARPGX2
Influenza A by PCR: NEGATIVE
Influenza B by PCR: NEGATIVE
SARS Coronavirus 2 by RT PCR: NEGATIVE

## 2020-08-05 LAB — BASIC METABOLIC PANEL
Anion gap: 9 (ref 5–15)
BUN: 12 mg/dL (ref 6–20)
CO2: 22 mmol/L (ref 22–32)
Calcium: 9.2 mg/dL (ref 8.9–10.3)
Chloride: 108 mmol/L (ref 98–111)
Creatinine, Ser: 0.81 mg/dL (ref 0.44–1.00)
GFR, Estimated: >60 mL/min (ref >60)
Glucose, Bld: 103 mg/dL — ABNORMAL HIGH (ref 70–99)
Potassium: 4.3 mmol/L (ref 3.5–5.1)
Sodium: 139 mmol/L (ref 135–145)

## 2020-08-05 MED ORDER — MAGNESIUM SULFATE 2 GM/50ML IV SOLN
2.0000 g | Freq: Once | INTRAVENOUS | Status: AC
  Administered 2020-08-05: 12:00:00 2 g via INTRAVENOUS
  Filled 2020-08-05: qty 50

## 2020-08-05 MED ORDER — ALBUTEROL (5 MG/ML) CONTINUOUS INHALATION SOLN
5.0000 mg/h | INHALATION_SOLUTION | Freq: Once | RESPIRATORY_TRACT | Status: DC
  Filled 2020-08-05: qty 20

## 2020-08-05 MED ORDER — HYDROCODONE-ACETAMINOPHEN 5-325 MG PO TABS
1.0000 | ORAL_TABLET | Freq: Once | ORAL | Status: AC
  Administered 2020-08-05: 16:00:00 1 via ORAL
  Filled 2020-08-05: qty 1

## 2020-08-05 MED ORDER — PREDNISONE 10 MG PO TABS: 40.0000 mg | ORAL_TABLET | Freq: Every day | ORAL | 0 refills | Status: AC

## 2020-08-05 MED ORDER — ALBUTEROL SULFATE (2.5 MG/3ML) 0.083% IN NEBU
INHALATION_SOLUTION | RESPIRATORY_TRACT | Status: AC
  Administered 2020-08-05: 11:00:00 2.5 mg
  Filled 2020-08-05: qty 3

## 2020-08-05 MED ORDER — ALBUTEROL SULFATE (2.5 MG/3ML) 0.083% IN NEBU: 2.5000 mg | INHALATION_SOLUTION | Freq: Four times a day (QID) | RESPIRATORY_TRACT | 12 refills | Status: DC | PRN

## 2020-08-05 MED ORDER — ALBUTEROL SULFATE HFA 108 (90 BASE) MCG/ACT IN AERS
2.0000 | INHALATION_SPRAY | RESPIRATORY_TRACT | Status: DC | PRN
  Administered 2020-08-05: 10:00:00 2 via RESPIRATORY_TRACT
  Filled 2020-08-05: qty 6.7

## 2020-08-05 MED ORDER — IPRATROPIUM-ALBUTEROL 0.5-2.5 (3) MG/3ML IN SOLN
3.0000 mL | Freq: Once | RESPIRATORY_TRACT | Status: AC
  Administered 2020-08-05: 13:00:00 3 mL via RESPIRATORY_TRACT
  Filled 2020-08-05: qty 3

## 2020-08-05 MED ORDER — FENTANYL CITRATE (PF) 100 MCG/2ML IJ SOLN
50.0000 ug | Freq: Once | INTRAMUSCULAR | Status: AC
  Administered 2020-08-05: 13:00:00 50 ug via INTRAVENOUS
  Filled 2020-08-05: qty 2

## 2020-08-05 MED ORDER — METHYLPREDNISOLONE SODIUM SUCC 125 MG IJ SOLR
125.0000 mg | Freq: Once | INTRAMUSCULAR | Status: AC
  Administered 2020-08-05: 12:00:00 125 mg via INTRAVENOUS
  Filled 2020-08-05: qty 2

## 2020-08-05 MED ORDER — IPRATROPIUM-ALBUTEROL 0.5-2.5 (3) MG/3ML IN SOLN
3.0000 mL | Freq: Once | RESPIRATORY_TRACT | Status: DC
  Filled 2020-08-05: qty 3

## 2020-08-05 MED ORDER — ALBUTEROL SULFATE HFA 108 (90 BASE) MCG/ACT IN AERS
1.0000 | INHALATION_SPRAY | Freq: Four times a day (QID) | RESPIRATORY_TRACT | 2 refills | Status: DC | PRN
Start: 2020-08-05 — End: 2020-10-29

## 2020-08-05 MED ORDER — ALBUTEROL (5 MG/ML) CONTINUOUS INHALATION SOLN
INHALATION_SOLUTION | RESPIRATORY_TRACT | Status: AC
  Filled 2020-08-05: qty 20

## 2020-08-05 MED ORDER — SODIUM CHLORIDE 0.9 % IV BOLUS
500.0000 mL | Freq: Once | INTRAVENOUS | Status: AC
  Administered 2020-08-05: 13:00:00 500 mL via INTRAVENOUS

## 2020-08-05 NOTE — ED Provider Notes (Signed)
[facility] EMERGENCY DEPARTMENT Provider Note   CSN: [id number] Arrival date & time: 08/05/20  7124     History Chief Complaint  Patient presents with  . Asthma    Julie Kerr is a 44 y.o. female with a past medical history of asthma requiring intubation and tracheostomy in the past, substance abuse presenting to the ED via private vehicle for shortness of breath and asthma exacerbation.  For the past 4 days has noticed diffuse wheezing, chest tightness and shortness of breath.  She does not have any type of inhaler or medications at home so she has not been taking any medications.  She noticed this morning that the symptoms got worse.  She denies any abdominal pain, vomiting, fever.  Reports persistent hacking cough. Remainder of history is limited secondary to tachypnea.  HPI     Past Medical History:  Diagnosis Date  . Asthma     There are no problems to display for this patient.   History reviewed. No pertinent surgical history.   OB History   No obstetric history on file.     No family history on file.     Home Medications Prior to Admission medications   Medication Sig Start Date End Date Taking? Authorizing Provider  acetaminophen (TYLENOL) 500 MG tablet Take 500 mg by mouth every 6 (six) hours as needed for moderate pain.   Yes [provider]  albuterol (PROVENTIL) (2.5 MG/3ML) 0.083% nebulizer solution Take 3 mLs (2.5 mg total) by nebulization every 6 (six) hours as needed for wheezing or shortness of breath. 08/05/20   Guyla Bless, PA-C  albuterol (VENTOLIN HFA) 108 (90 Base) MCG/ACT inhaler Inhale 1-2 puffs into the lungs every 6 (six) hours as needed for wheezing or shortness of breath. 08/05/20   Lyle Leisner, PA-C  predniSONE (DELTASONE) 10 MG tablet Take 4 tablets (40 mg total) by mouth daily for 4 days. 08/05/20 08/09/20  Dietrich Pates, PA-C    Allergies    Nsaids  Review of Systems   Review of Systems   Constitutional: Negative for appetite change, chills and fever.  HENT: Negative for ear pain, rhinorrhea, sneezing and sore throat.   Eyes: Negative for photophobia and visual disturbance.  Respiratory: Positive for cough, chest tightness, shortness of breath and wheezing.   Cardiovascular: Negative for chest pain and palpitations.  Gastrointestinal: Negative for abdominal pain, blood in stool, constipation, diarrhea, nausea and vomiting.  Genitourinary: Negative for dysuria, hematuria and urgency.  Musculoskeletal: Negative for myalgias.  Skin: Negative for rash.  Neurological: Negative for dizziness, weakness and light-headedness.    Physical Exam Updated Vital Signs BP 113/73   Pulse 83   Temp 98.8 F (37.1 C) (Oral)   Resp (!) 24   Ht 5\' 2"  (1.575 m)   Wt 83.9 kg   LMP 08/02/2020   SpO2 100%   BMI 33.84 kg/m   Physical Exam Vitals and nursing note reviewed.  Constitutional:      General: She is not in acute distress.    Appearance: She is well-developed and well-nourished.  HENT:     Head: Normocephalic and atraumatic.     Nose: Nose normal.  Eyes:     General: No scleral icterus.       Right eye: No discharge.        Left eye: No discharge.     Extraocular Movements: EOM normal.     Conjunctiva/sclera: Conjunctivae normal.  Cardiovascular:     Rate and Rhythm: Regular  rhythm. Tachycardia present.     Pulses: Intact distal pulses.     Heart sounds: Normal heart sounds. No murmur heard. No friction rub. No gallop.   Pulmonary:     Effort: Tachypnea and accessory muscle usage present. No respiratory distress.     Breath sounds: Examination of the right-upper field reveals wheezing. Examination of the left-upper field reveals wheezing. Examination of the right-middle field reveals wheezing. Examination of the left-middle field reveals wheezing. Examination of the right-lower field reveals wheezing. Examination of the left-lower field reveals wheezing. Wheezing  present.  Abdominal:     General: Bowel sounds are normal. There is no distension.     Palpations: Abdomen is soft.     Tenderness: There is no abdominal tenderness. There is no guarding.  Musculoskeletal:        General: No edema. Normal range of motion.     Cervical back: Normal range of motion and neck supple.     Right lower leg: No edema.     Left lower leg: No edema.  Skin:    General: Skin is warm and dry.     Findings: No rash.  Neurological:     Mental Status: She is alert.     Motor: No abnormal muscle tone.     Coordination: Coordination normal.  Psychiatric:        Mood and Affect: Mood and affect normal. Affect is tearful.     ED Results / Procedures / Treatments   Labs (all labs ordered are listed, but only abnormal results are displayed) Labs Reviewed  BASIC METABOLIC PANEL - Abnormal; Notable for the following components:      Result Value   Glucose, Bld 103 (*)    All other components within normal limits  CBC WITH DIFFERENTIAL/PLATELET - Abnormal; Notable for the following components:   MCH 25.3 (*)    All other components within normal limits  RESP PANEL BY RT-PCR (FLU A&B, COVID) ARPGX2  I-STAT BETA HCG BLOOD, ED ([facility], [facility], [facility] ONLY)    EKG None  Radiology DG Chest Portable 1 View  Result Date: 08/05/2020 CLINICAL DATA:  Shortness of breath for the past 4 days. EXAM: PORTABLE CHEST 1 VIEW COMPARISON:  Chest x-ray dated February 02, 2020. FINDINGS: The heart size and mediastinal contours are within normal limits. Both lungs are clear. The visualized skeletal structures are unremarkable. IMPRESSION: No active disease. Electronically Signed   By: Obie Dredge M.D.   On: 08/05/2020 11:20    Procedures Procedures (including critical care time)  Medications Ordered in ED Medications  methylPREDNISolone sodium succinate (SOLU-MEDROL) 125 mg/2 mL injection 125 mg (125 mg Intravenous Given 08/05/20 1219)  magnesium sulfate IVPB 2 g 50 mL (0 g Intravenous  Stopped 08/05/20 1324)  albuterol (PROVENTIL) (2.5 MG/3ML) 0.083% nebulizer solution (2.5 mg  Given 08/05/20 1123)  sodium chloride 0.9 % bolus 500 mL (500 mLs Intravenous New Bag/Given 08/05/20 1325)  fentaNYL (SUBLIMAZE) injection 50 mcg (50 mcg Intravenous Given 08/05/20 1325)  ipratropium-albuterol (DUONEB) 0.5-2.5 (3) MG/3ML nebulizer solution 3 mL (3 mLs Nebulization Given 08/05/20 1311)    ED Course  I have reviewed the triage vital signs and the nursing notes.  Pertinent labs & imaging results that were available during my care of the patient were reviewed by me and considered in my medical decision making (see chart for details).  Clinical Course as of 08/05/20 1513  Wed Aug 05, 2020  1106 I have paged respiratory tech who will evaluate patient  at the bedside I have ordered a continuous albuterol treatment as well as steroids and magnesium. [HK]  1110 Patient appears more calm although still does have diffuse wheezing.  Will start with DuoNeb and reassess.  She feels that this is reasonable.  She remains tachypneic but her tachycardia has improved with rest.  Suspect some component of anxiety based on her history. [HK]  1137 Patient with some improvement after albuterol nebulizer treatment.  Will order DuoNeb and patient still awaiting additional medications [HK]  1303 Patient continues to have wheezing diffusely and continues to complain of rib pain and back pain.  Will order pain medication as well as IV fluids and reassess. [HK]  1304 Resp: 20 [HK]  1343 Pulse Rate: 86 [HK]  1343 SpO2: 100 % [HK]  1343 Doing to have improvement.  Her vital signs remained within normal limits.  Only faint wheezing noted in bilateral lung fields now. [HK]  1400 WBC: 6.9 [HK]    Clinical Course User Index [HK] Dietrich PatesKhatri, Tyliah Schlereth, PA-C   MDM Rules/Calculators/A&P                          44 year old female with past medical history of asthma requiring intubation and tracheostomy in the past, substance  abuse presenting to the ED for shortness of breath and asthma exacerbation.  Reports 4-day history of worsening diffuse wheezing, chest tightness and shortness of breath.  Reports pain associated to her bilateral lower ribs and back due to her cough.  She does not have any medications at home and rate of her inhaler.  On initial examination patient tachypneic, tachycardic and tearful.  I suspect there was some component of anxiety as when I checked on her a few minutes later she had calm down.  Her tachycardia had improved spontaneously.  She does however have diffuse wheezing and tightness noted in bilateral lung fields.  She was given albuterol, Solu-Medrol, magnesium with some improvement.  Will reassess after additional medications and lab work results.  Chest x-ray is negative for acute abnormality.  Patient with improvement in her symptoms with DuoNeb.  Her tachycardia, tachypnea have improved and her oxygen saturations remain 100% on room air.  Her pain is controlled with fentanyl which I feel is most likely musculoskeletal from her constant cough.  She feels that she will be able to manage her symptoms at home.  I have provided her refills of her inhalers and nebulizer solution as well as remainder of steroid course.  Offered additional medications here as well.  She is comfortable with following up with primary care provider and appears much improved since initial evaluation.   Patient is hemodynamically stable, in NAD, and able to ambulate in the ED. Evaluation does not show pathology that would require ongoing emergent intervention or inpatient treatment. I explained the diagnosis to the patient. Pain has been managed and has no complaints prior to discharge. Patient is comfortable with above plan and is stable for discharge at this time. All questions were answered prior to disposition. Strict return precautions for returning to the ED were discussed. Encouraged follow up with PCP.   An After  Visit Summary was printed and given to the patient.   Portions of this note were generated with Scientist, clinical (histocompatibility and immunogenetics)Dragon dictation software. Dictation errors may occur despite best attempts at proofreading.   Final Clinical Impression(s) / ED Diagnoses Final diagnoses:  Moderate persistent asthma with exacerbation    Rx / DC Orders ED Discharge Orders  Ordered    predniSONE (DELTASONE) 10 MG tablet  Daily        08/05/20 1431    albuterol (PROVENTIL) (2.5 MG/3ML) 0.083% nebulizer solution  Every 6 hours PRN        08/05/20 1433    albuterol (VENTOLIN HFA) 108 (90 Base) MCG/ACT inhaler  Every 6 hours PRN        08/05/20 1433           Dietrich Pates, PA-C 08/05/20 1513    Tegeler, Canary Brim, MD 08/05/20 [phone]

## 2020-08-05 NOTE — ED Triage Notes (Signed)
Pt reports asthma attack x4 days, does not have inhaler at home. Pt wheezing with labored respirations and tripod breathing in triage. Hx of intubation r/t asthma in the past.

## 2020-08-05 NOTE — Discharge Instructions (Addendum)
Take the steroids beginning tomorrow. I have also provided you with a refill of your albuterol inhaler as well as an inhaler here in the ER today. Take the albuterol nebulizer as needed as well at home. Follow-up with your primary care provider. Return to the ER if you start to experience worsening shortness of breath, wheezing or chest pain.

## 2020-08-06 ENCOUNTER — Encounter (HOSPITAL_COMMUNITY): Payer: Self-pay

## 2020-10-25 ENCOUNTER — Inpatient Hospital Stay (HOSPITAL_COMMUNITY)
Admission: EM | Admit: 2020-10-25 | Discharge: 2020-10-29 | DRG: 202 | Disposition: A | Discharge status: Home Health | Payer: 59 | Attending: Internal Medicine | Admitting: Internal Medicine

## 2020-10-25 ENCOUNTER — Other Ambulatory Visit: Payer: Self-pay

## 2020-10-25 ENCOUNTER — Encounter (HOSPITAL_COMMUNITY): Payer: Self-pay | Admitting: Internal Medicine

## 2020-10-25 ENCOUNTER — Emergency Department (HOSPITAL_COMMUNITY): Payer: 59

## 2020-10-25 DIAGNOSIS — F1120 Opioid dependence, uncomplicated: Secondary | ICD-10-CM

## 2020-10-25 DIAGNOSIS — E669 Obesity, unspecified: Secondary | ICD-10-CM | POA: Diagnosis present

## 2020-10-25 DIAGNOSIS — K219 Gastro-esophageal reflux disease without esophagitis: Secondary | ICD-10-CM | POA: Diagnosis present

## 2020-10-25 DIAGNOSIS — Z79899 Other long term (current) drug therapy: Secondary | ICD-10-CM

## 2020-10-25 DIAGNOSIS — F17200 Nicotine dependence, unspecified, uncomplicated: Secondary | ICD-10-CM | POA: Diagnosis not present

## 2020-10-25 DIAGNOSIS — Z886 Allergy status to analgesic agent status: Secondary | ICD-10-CM

## 2020-10-25 DIAGNOSIS — R739 Hyperglycemia, unspecified: Secondary | ICD-10-CM | POA: Diagnosis present

## 2020-10-25 DIAGNOSIS — J45901 Unspecified asthma with (acute) exacerbation: Principal | ICD-10-CM | POA: Diagnosis present

## 2020-10-25 DIAGNOSIS — Z91018 Allergy to other foods: Secondary | ICD-10-CM

## 2020-10-25 DIAGNOSIS — Z888 Allergy status to other drugs, medicaments and biological substances status: Secondary | ICD-10-CM

## 2020-10-25 DIAGNOSIS — Z8249 Family history of ischemic heart disease and other diseases of the circulatory system: Secondary | ICD-10-CM

## 2020-10-25 DIAGNOSIS — E440 Moderate protein-calorie malnutrition: Secondary | ICD-10-CM | POA: Diagnosis present

## 2020-10-25 DIAGNOSIS — D72829 Elevated white blood cell count, unspecified: Secondary | ICD-10-CM | POA: Diagnosis present

## 2020-10-25 DIAGNOSIS — Z23 Encounter for immunization: Secondary | ICD-10-CM

## 2020-10-25 DIAGNOSIS — I10 Essential (primary) hypertension: Secondary | ICD-10-CM | POA: Diagnosis present

## 2020-10-25 DIAGNOSIS — R7303 Prediabetes: Secondary | ICD-10-CM | POA: Diagnosis present

## 2020-10-25 DIAGNOSIS — Z825 Family history of asthma and other chronic lower respiratory diseases: Secondary | ICD-10-CM

## 2020-10-25 DIAGNOSIS — T380X5A Adverse effect of glucocorticoids and synthetic analogues, initial encounter: Secondary | ICD-10-CM | POA: Diagnosis present

## 2020-10-25 DIAGNOSIS — Z87891 Personal history of nicotine dependence: Secondary | ICD-10-CM

## 2020-10-25 DIAGNOSIS — Z20822 Contact with and (suspected) exposure to covid-19: Secondary | ICD-10-CM | POA: Diagnosis present

## 2020-10-25 DIAGNOSIS — J449 Chronic obstructive pulmonary disease, unspecified: Secondary | ICD-10-CM | POA: Diagnosis present

## 2020-10-25 DIAGNOSIS — F4322 Adjustment disorder with anxiety: Secondary | ICD-10-CM | POA: Diagnosis present

## 2020-10-25 DIAGNOSIS — Z981 Arthrodesis status: Secondary | ICD-10-CM

## 2020-10-25 DIAGNOSIS — G894 Chronic pain syndrome: Secondary | ICD-10-CM | POA: Diagnosis present

## 2020-10-25 DIAGNOSIS — Z6830 Body mass index (BMI) 30.0-30.9, adult: Secondary | ICD-10-CM

## 2020-10-25 LAB — I-STAT ARTERIAL BLOOD GAS, ED
Acid-base deficit: 2 mmol/L (ref 0.0–2.0)
Bicarbonate: 23.8 mmol/L (ref 20.0–28.0)
Calcium, Ion: 1.31 mmol/L (ref 1.15–1.40)
HCT: 38 % (ref 36.0–46.0)
Hemoglobin: 12.9 g/dL (ref 12.0–15.0)
O2 Saturation: 99 %
Potassium: 3.7 mmol/L (ref 3.5–5.1)
Sodium: 139 mmol/L (ref 135–145)
TCO2: 25 mmol/L (ref 22–32)
pCO2 arterial: 42.6 mmHg (ref 32.0–48.0)
pH, Arterial: 7.355 (ref 7.350–7.450)
pO2, Arterial: 162 mmHg — ABNORMAL HIGH (ref 83.0–108.0)

## 2020-10-25 LAB — BASIC METABOLIC PANEL
Anion gap: 10 (ref 5–15)
BUN: 11 mg/dL (ref 6–20)
CO2: 21 mmol/L — ABNORMAL LOW (ref 22–32)
Calcium: 9.4 mg/dL (ref 8.9–10.3)
Chloride: 106 mmol/L (ref 98–111)
Creatinine, Ser: 0.85 mg/dL (ref 0.44–1.00)
GFR, Estimated: >60 mL/min (ref >60)
Glucose, Bld: 122 mg/dL — ABNORMAL HIGH (ref 70–99)
Potassium: 4 mmol/L (ref 3.5–5.1)
Sodium: 137 mmol/L (ref 135–145)

## 2020-10-25 LAB — CBC
HCT: 39.7 % (ref 36.0–46.0)
Hemoglobin: 12.3 g/dL (ref 12.0–15.0)
MCH: 25.7 pg — ABNORMAL LOW (ref 26.0–34.0)
MCHC: 31 g/dL (ref 30.0–36.0)
MCV: 82.9 fL (ref 80.0–100.0)
Platelets: 311 10*3/uL (ref 150–400)
RBC: 4.79 MIL/uL (ref 3.87–5.11)
RDW: 14.8 % (ref 11.5–15.5)
WBC: 11.8 10*3/uL — ABNORMAL HIGH (ref 4.0–10.5)
nRBC: 0 % (ref 0.0–0.2)

## 2020-10-25 LAB — RESP PANEL BY RT-PCR (FLU A&B, COVID) ARPGX2
Influenza A by PCR: NEGATIVE
Influenza B by PCR: NEGATIVE
SARS Coronavirus 2 by RT PCR: NEGATIVE

## 2020-10-25 MED ORDER — SODIUM CHLORIDE 0.9 % IV SOLN
1000.0000 mL | INTRAVENOUS | Status: DC
  Administered 2020-10-25: 1000 mL via INTRAVENOUS

## 2020-10-25 MED ORDER — SODIUM CHLORIDE 0.9 % IV SOLN: INTRAVENOUS | Status: DC

## 2020-10-25 MED ORDER — ALBUTEROL (5 MG/ML) CONTINUOUS INHALATION SOLN
10.0000 mg/h | INHALATION_SOLUTION | RESPIRATORY_TRACT | Status: DC
  Administered 2020-10-25: 10 mg/h via RESPIRATORY_TRACT
  Filled 2020-10-25: qty 20

## 2020-10-25 MED ORDER — PREDNISONE 20 MG PO TABS: 40.0000 mg | ORAL_TABLET | Freq: Every day | ORAL | Status: DC

## 2020-10-25 MED ORDER — ALBUTEROL (5 MG/ML) CONTINUOUS INHALATION SOLN
10.0000 mg/h | INHALATION_SOLUTION | Freq: Once | RESPIRATORY_TRACT | Status: AC
  Administered 2020-10-25: 10 mg/h via RESPIRATORY_TRACT
  Filled 2020-10-25: qty 20

## 2020-10-25 MED ORDER — HYDROCOD POLST-CPM POLST ER 10-8 MG/5ML PO SUER
5.0000 mL | Freq: Once | ORAL | Status: AC
  Administered 2020-10-25: 5 mL via ORAL
  Filled 2020-10-25: qty 5

## 2020-10-25 MED ORDER — LEVOFLOXACIN IN D5W 750 MG/150ML IV SOLN
750.0000 mg | INTRAVENOUS | Status: DC
  Administered 2020-10-25 – 2020-10-28 (×4): 750 mg via INTRAVENOUS
  Filled 2020-10-25 (×5): qty 150

## 2020-10-25 MED ORDER — MOMETASONE FURO-FORMOTEROL FUM 200-5 MCG/ACT IN AERO
2.0000 | INHALATION_SPRAY | Freq: Two times a day (BID) | RESPIRATORY_TRACT | Status: DC
  Administered 2020-10-25 – 2020-10-29 (×8): 2 via RESPIRATORY_TRACT
  Filled 2020-10-25: qty 8.8

## 2020-10-25 MED ORDER — HYDROCOD POLST-CPM POLST ER 10-8 MG/5ML PO SUER
5.0000 mL | Freq: Two times a day (BID) | ORAL | Status: DC | PRN
  Administered 2020-10-25 – 2020-10-29 (×7): 5 mL via ORAL
  Filled 2020-10-25 (×7): qty 5

## 2020-10-25 MED ORDER — SODIUM CHLORIDE 0.9 % IV BOLUS (SEPSIS)
1000.0000 mL | Freq: Once | INTRAVENOUS | Status: AC
  Administered 2020-10-25: 1000 mL via INTRAVENOUS

## 2020-10-25 MED ORDER — ONDANSETRON HCL 4 MG/2ML IJ SOLN
4.0000 mg | Freq: Once | INTRAMUSCULAR | Status: AC
  Administered 2020-10-25: 4 mg via INTRAVENOUS
  Filled 2020-10-25: qty 2

## 2020-10-25 MED ORDER — IPRATROPIUM-ALBUTEROL 0.5-2.5 (3) MG/3ML IN SOLN
3.0000 mL | Freq: Four times a day (QID) | RESPIRATORY_TRACT | Status: DC
  Administered 2020-10-26 – 2020-10-27 (×7): 3 mL via RESPIRATORY_TRACT
  Filled 2020-10-25 (×8): qty 3

## 2020-10-25 MED ORDER — METHYLPREDNISOLONE SODIUM SUCC 125 MG IJ SOLR
125.0000 mg | Freq: Four times a day (QID) | INTRAMUSCULAR | Status: DC
  Administered 2020-10-25: 125 mg via INTRAVENOUS
  Filled 2020-10-25: qty 2

## 2020-10-25 MED ORDER — ENOXAPARIN SODIUM 40 MG/0.4ML ~~LOC~~ SOLN
40.0000 mg | SUBCUTANEOUS | Status: DC
  Administered 2020-10-25 – 2020-10-28 (×4): 40 mg via SUBCUTANEOUS
  Filled 2020-10-25 (×4): qty 0.4

## 2020-10-25 MED ORDER — MORPHINE SULFATE (PF) 4 MG/ML IV SOLN
4.0000 mg | Freq: Once | INTRAVENOUS | Status: AC
  Administered 2020-10-25: 4 mg via INTRAVENOUS
  Filled 2020-10-25: qty 1

## 2020-10-25 MED ORDER — LORAZEPAM 2 MG/ML IJ SOLN
0.5000 mg | Freq: Once | INTRAMUSCULAR | Status: AC
  Administered 2020-10-25: 0.5 mg via INTRAVENOUS
  Filled 2020-10-25: qty 1

## 2020-10-25 MED ORDER — ALBUTEROL SULFATE (2.5 MG/3ML) 0.083% IN NEBU
2.5000 mg | INHALATION_SOLUTION | RESPIRATORY_TRACT | Status: DC | PRN
  Administered 2020-10-26: 2.5 mg via RESPIRATORY_TRACT
  Filled 2020-10-25: qty 3

## 2020-10-25 NOTE — ED Notes (Signed)
The pt has  Taken her bp cuff off her pulse o x and her cardiac monitor

## 2020-10-25 NOTE — H&P (Signed)
History and Physical   [name] JSE:[id number] DOB: [birth year] DOA: 10/25/2020  Referring MD/NP/PA: Dr. Particia Nearing  PCP: Pcp, No   Outpatient Specialists: None  Patient coming from: Home  Chief Complaint: Shortness of breath  HPI: Julie Kerr is a 45 y.o. female with medical history significant of asthma and COPD, polysubstance abuse including tobacco and cocaine, vocal cord dysfunction, hypertension, anxiety disorder who presented to the ER with shortness of breath for the last few days.  Symptoms consistent with her typical asthma attack.  Denied any chest pain.  Has been coughing thick sputum.  Patient complained of significant chest wall pain from the cough.  No fever.  She has tried all her home medications with no relief.  Patient has also been given multiple treatments in the ER with no relief.  Patient therefore being admitted to the hospital for further evaluation and treatment.  She denied any exposure to COVID-19.  No pneumonia identified at this point.  She is very anxious otherwise..  ED Course: Temperature 99.4 blood pressure 88/65 initially with pulse 140 respirate 30 oxygen sat 90% room air.  White count 11.8 hemoglobin and platelets all within normal.  CO2 of 21 otherwise  creatinine is normal.  COVID-19 negative. CXR negative.  Review of Systems: As per HPI otherwise 10 point review of systems negative.    Past Medical History:  Diagnosis Date  . Anxiety   . Asthma   . Bronchitis   . Chronic narcotic dependence (HCC)   . Heart murmur   . Hypertension   . Shortness of breath   . Vocal cord dysfunction     Past Surgical History:  Procedure Laterality Date  . SPINAL FUSION  11/2012   Performed in [facility] by Dr. Mayford Knife  . TRACHEOSTOMY  December 2015  . TRACHEOSTOMY CLOSURE       reports that she quit smoking about 6 years ago. Her smoking use included cigarettes. She smoked 0.00 packs per day for 17.00 years. She has never used smokeless tobacco. She  reports current alcohol use. She reports current drug use. Drug: Cocaine.  Allergies  Allergen Reactions  . Cellulose Hives  . Robitussin Dm [Dextromethorphan-Guaifenesin] Nausea And Vomiting  . Chocolate Hives  . Nsaids Hives and Swelling  . Suboxone [Buprenorphine Hcl-Naloxone Hcl] Other (See Comments)    Aggressive behavior  . Nsaids Hives  . Rayon, Purified Hives  . Tolmetin Hives  . Tramadol Hives and Itching    Family History  Problem Relation Age of Onset  . HIV Mother   . Heart disease Father   . CVA Father   . Heart disease Other   . Emphysema Maternal Grandmother        smoked  . Clotting disorder Maternal Grandmother   . Lung cancer Maternal Grandmother        smoked  . Asthma Sister   . Clotting disorder Sister      Prior to Admission medications   Medication Sig Start Date End Date Taking? Authorizing Provider  albuterol (PROVENTIL) (2.5 MG/3ML) 0.083% nebulizer solution Take 3 mLs (2.5 mg total) by nebulization every 6 (six) hours as needed for wheezing or shortness of breath. 08/05/20  Yes Khatri, Hina, PA-C  albuterol (VENTOLIN HFA) 108 (90 Base) MCG/ACT inhaler Inhale 1-2 puffs into the lungs every 6 (six) hours as needed for wheezing or shortness of breath. 08/05/20  Yes Khatri, Hina, PA-C  fluticasone furoate-vilanterol (BREO ELLIPTA) 200-25 MCG/INH AEPB Inhale 1 puff into the lungs daily. 02/06/20  Yes Christian, Rylee, MD  ipratropium-albuterol (DUONEB) 0.5-2.5 (3) MG/3ML SOLN Take 3 mLs by nebulization every 4 (four) hours as needed (shortness of breath/wheezing). 02/04/20  Yes Christian, Rylee, MD  acetaminophen (TYLENOL) 325 MG tablet Take 2 tablets (650 mg total) every 6 (six) hours as needed by mouth for mild pain (or Fever >/= 101). Patient not taking: No sig reported 06/22/17   Marguerita Merles Latif, DO  albuterol (PROVENTIL HFA;VENTOLIN HFA) 108 (90 Base) MCG/ACT inhaler Inhale 2 puffs into the lungs every 6 (six) hours as needed for wheezing or  shortness of breath. Patient not taking: No sig reported 10/25/16   Hoy Register, MD  ALPRAZolam Prudy Feeler) 0.5 MG tablet Take 1 tablet (0.5 mg total) by mouth 3 (three) times daily as needed for sleep or anxiety. Take 1 tablet (0.5 mg) by mouth 3 times daily as needed for panic attacks. Patient not taking: No sig reported 02/06/20   [name], MD  fluticasone (FLONASE) 50 MCG/ACT nasal spray Place 2 sprays into both nostrils 2 (two) times daily. Patient not taking: No sig reported 02/03/16   Storm Frisk, MD  gabapentin (NEURONTIN) 300 MG capsule Take 2 capsules (600 mg total) by mouth 2 (two) times daily. Patient not taking: No sig reported 02/03/16   Storm Frisk, MD  guaiFENesin (MUCINEX) 600 MG 12 hr tablet Take 2 tablets (1,200 mg total) 2 (two) times daily by mouth. Patient not taking: No sig reported 06/22/17   Marguerita Merles Latif, DO  pantoprazole (PROTONIX) 40 MG tablet Take 1 tablet (40 mg total) 2 (two) times daily by mouth. Patient not taking: No sig reported 06/22/17   Marguerita Merles [place], [place]    Physical Exam: Vitals:   10/25/20 1553 10/25/20 1645 10/25/20 1730 10/25/20 1815  BP:   101/73 126/66  Pulse:  (!) 141 (!) 124 (!) 125  Resp:  (!) 24 20 (!) 28  Temp:      TempSrc:      SpO2: 100% 90% 95% 97%      Constitutional: Very anxious, short of breath Vitals:   10/25/20 1553 10/25/20 1645 10/25/20 1730 10/25/20 1815  BP:   101/73 126/66  Pulse:  (!) 141 (!) 124 (!) 125  Resp:  (!) 24 20 (!) 28  Temp:      TempSrc:      SpO2: 100% 90% 95% 97%   Eyes: PERRL, lids and conjunctivae normal ENMT: Mucous membranes are dry. Posterior pharynx clear of any exudate or lesions.Normal dentition.  Neck: normal, supple, no masses, no thyromegaly Respiratory: Decreased air entry bilaterally with marked expiratory wheezing.  Increased respiratory effort. No accessory muscle use.  Cardiovascular: Regular rate and rhythm, no murmurs / rubs / gallops. No extremity edema.  2+ pedal pulses. No carotid bruits.  Abdomen: no tenderness, no masses palpated. No hepatosplenomegaly. Bowel sounds positive.  Musculoskeletal: no clubbing / cyanosis. No joint deformity upper and lower extremities. Good ROM, no contractures. Normal muscle tone.  Skin: no rashes, lesions, ulcers. No induration Neurologic: CN 2-12 grossly intact. Sensation intact, DTR normal. Strength 5/5 in all 4.  Psychiatric: Normal judgment and insight. Alert and oriented x 3.  Very anxious mood.     Labs on Admission: I have personally reviewed following labs and imaging studies  CBC: Recent Labs  Lab 10/25/20 1413 10/25/20 1417  WBC 11.8*  --   HGB 12.3 12.9  HCT 39.7 38.0  MCV 82.9  --   PLT 311  --  Basic Metabolic Panel: Recent Labs  Lab 10/25/20 1413 10/25/20 1417  NA 137 139  K 4.0 3.7  CL 106  --   CO2 21*  --   GLUCOSE 122*  --   BUN 11  --   CREATININE 0.85  --   CALCIUM 9.4  --    GFR: CrCl cannot be calculated (Unknown ideal weight.). Liver Function Tests: No results for input(s): AST, ALT, ALKPHOS, BILITOT, PROT, ALBUMIN in the last 168 hours. No results for input(s): LIPASE, AMYLASE in the last 168 hours. No results for input(s): AMMONIA in the last 168 hours. Coagulation Profile: No results for input(s): INR, PROTIME in the last 168 hours. Cardiac Enzymes: No results for input(s): CKTOTAL, CKMB, CKMBINDEX, TROPONINI in the last 168 hours. BNP (last 3 results) No results for input(s): PROBNP in the last 8760 hours. HbA1C: No results for input(s): HGBA1C in the last 72 hours. CBG: No results for input(s): GLUCAP in the last 168 hours. Lipid Profile: No results for input(s): CHOL, HDL, LDLCALC, TRIG, CHOLHDL, LDLDIRECT in the last 72 hours. Thyroid Function Tests: No results for input(s): TSH, T4TOTAL, FREET4, T3FREE, THYROIDAB in the last 72 hours. Anemia Panel: No results for input(s): VITAMINB12, FOLATE, FERRITIN, TIBC, IRON, RETICCTPCT in the last 72  hours. Urine analysis:    Component Value Date/Time   COLORURINE YELLOW 08/28/2015 1714   APPEARANCEUR CLEAR 08/28/2015 1714   LABSPEC 1.019 08/28/2015 1714   PHURINE 6.5 08/28/2015 1714   GLUCOSEU >1000 (A) 08/28/2015 1714   HGBUR NEGATIVE 08/28/2015 1714   BILIRUBINUR NEGATIVE 08/28/2015 1714   KETONESUR 15 (A) 08/28/2015 1714   PROTEINUR NEGATIVE 08/28/2015 1714   UROBILINOGEN 0.2 03/25/2015 1526   NITRITE NEGATIVE 08/28/2015 1714   LEUKOCYTESUR NEGATIVE 08/28/2015 1714   Sepsis Labs: @LABRCNTIP (procalcitonin:4,lacticidven:4) ) Recent Results (from the past 240 hour(s))  Resp Panel by RT-PCR (Flu A&B, Covid) Nasopharyngeal Swab     Status: None   Collection Time: 10/25/20  4:43 PM   Specimen: Nasopharyngeal Swab; Nasopharyngeal(NP) swabs in vial transport medium  Result Value Ref Range Status   SARS Coronavirus 2 by RT PCR NEGATIVE NEGATIVE Final    Comment: (NOTE) SARS-CoV-2 target nucleic acids are NOT DETECTED.  The SARS-CoV-2 RNA is generally detectable in upper respiratory specimens during the acute phase of infection. The lowest concentration of SARS-CoV-2 viral copies this assay can detect is 138 copies/mL. A negative result does not preclude SARS-Cov-2 infection and should not be used as the sole basis for treatment or other patient management decisions. A negative result may occur with  improper specimen collection/handling, submission of specimen other than nasopharyngeal swab, presence of viral mutation(s) within the areas targeted by this assay, and inadequate number of viral copies(<138 copies/mL). A negative result must be combined with clinical observations, patient history, and epidemiological information. The expected result is Negative.  Fact Sheet for Patients:  BloggerCourse.comhttps://www.fda.gov/media/152166/download  Fact Sheet for Healthcare Providers:  SeriousBroker.ithttps://www.fda.gov/media/152162/download  This test is no t yet approved or cleared by the [place]  FDA and  has been authorized for detection and/or diagnosis of SARS-CoV-2 by FDA under an Emergency Use Authorization (EUA). This EUA will remain  in effect (meaning this test can be used) for the duration of the COVID-19 declaration under Section 564(b)(1) of the Act, 21 U.S.C.section 360bbb-3(b)(1), unless the authorization is terminated  or revoked sooner.       Influenza A by PCR NEGATIVE NEGATIVE Final   Influenza B by PCR NEGATIVE NEGATIVE Final  Comment: (NOTE) The Xpert Xpress SARS-CoV-2/FLU/RSV plus assay is intended as an aid in the diagnosis of influenza from Nasopharyngeal swab specimens and should not be used as a sole basis for treatment. Nasal washings and aspirates are unacceptable for Xpert Xpress SARS-CoV-2/FLU/RSV testing.  Fact Sheet for Patients: BloggerCourse.com  Fact Sheet for Healthcare Providers: SeriousBroker.it  This test is not yet approved or cleared by the [place] FDA and has been authorized for detection and/or diagnosis of SARS-CoV-2 by FDA under an Emergency Use Authorization (EUA). This EUA will remain in effect (meaning this test can be used) for the duration of the COVID-19 declaration under Section 564(b)(1) of the Act, 21 U.S.C. section 360bbb-3(b)(1), unless the authorization is terminated or revoked.  Performed at [facility], [address], [place], [place] [address]      Radiological Exams on Admission: DG Chest Port 1 View  Result Date: 10/25/2020 CLINICAL DATA:  Shortness of breath EXAM: PORTABLE CHEST 1 VIEW COMPARISON:  August 05, 2020 FINDINGS: The lungs are clear. The heart size and pulmonary vascularity are normal. No adenopathy. No bone lesions. IMPRESSION: Lungs clear.  Cardiac silhouette normal. Electronically Signed   By: Bretta Bang III M.D.   On: 10/25/2020 14:30    EKG: Independently reviewed. Sinus tachycardia with a rate of 110,  LVH.  Assessment/Plan Principal Problem:   Asthma exacerbation Active Problems:   Tobacco use disorder   Adjustment disorder with anxious mood   GERD (gastroesophageal reflux disease)   Chronic narcotic dependence (HCC)     #1 acute exacerbation of asthma: Patient will be admitted.  Initiated on IV steroids nebulizer and oxygen.  She is a smoker so there may be a component of COPD.  I will therefore add some antibiotics.  Monitor and follow closely.  #2 GERD: Continue with PPIs  #3 chronic pain syndrome: Counseling provided  #4 polysubstance abuse: Including tobacco use.  Urine drug screen.  Nicotine patch  #5 anxiety disorder: Continue home regimen   DVT prophylaxis: Lovenox Code Status: Full  Family Communication: No family at bedside Disposition Plan: Home Consults called: None Admission status: Inpatient  Severity of Illness: The appropriate patient status for this patient is INPATIENT. Inpatient status is judged to be reasonable and necessary in order to provide the required intensity of service to ensure the patient's safety. The patient's presenting symptoms, physical exam findings, and initial radiographic and laboratory data in the context of their chronic comorbidities is felt to place them at high risk for further clinical deterioration. Furthermore, it is not anticipated that the patient will be medically stable for discharge from the hospital within 2 midnights of admission. The following factors support the patient status of inpatient.   " The patient's presenting symptoms include shortness of breath and wheeze. " The worrisome physical exam findings include mild expiratory wheezing. " The initial radiographic and laboratory data are worrisome because of no infiltrates. " The chronic co-morbidities include asthma.   * I certify that at the point of admission it is my clinical judgment that the patient will require inpatient hospital care spanning beyond 2  midnights from the point of admission due to high intensity of service, high risk for further deterioration and high frequency of surveillance required.Lonia Blood MD [facility] Pager [phone]  If 7PM-7AM, please contact night-coverage [email or web address] Password [facility]  10/25/2020, 7:16 PM

## 2020-10-25 NOTE — ED Notes (Signed)
Pt coughiing although she was sleeping soundly appro 15 minutes ago.  She does not think that  The nebulizer is not helping  Pt c/o pain all over her body  Watching  The needle as the med was given

## 2020-10-25 NOTE — ED Provider Notes (Signed)
[facility] EMERGENCY DEPARTMENT Provider Note   CSN: [id number] Arrival date & time: 10/25/20  1409     History Chief Complaint  Patient presents with  . Respiratory Distress    Julie Kerr is a 45 y.o. female.  HPI   Patient presents to the ED for evaluation of shortness of breath.  Patient has a history of asthma.  She also has a history of cigarette use.  Patient states she has had issues multiple times in the past with her breathing she has had to be intubated multiple times.  She also had a tracheostomy at some point.  Patient states she ran out of her medications several days ago.  She started having increasing trouble with cough and congestion.  Patient states she had to call EMS.  She feels extremely short of breath.  She cannot catch her breath.  She is also having sharp pain in her chest with coughing.  No vomiting or diarrhea.  Patient has been vaccinated for COVID  Past Medical History:  Diagnosis Date  . Anxiety   . Asthma   . Bronchitis   . Chronic narcotic dependence (HCC)   . Heart murmur   . Hypertension   . Shortness of breath   . Vocal cord dysfunction     Patient Active Problem List   Diagnosis Date Noted  . Asthma exacerbation 02/02/2020  . Acute dyspnea 06/20/2017  . Chronic narcotic dependence (HCC) 06/20/2017  . Hypertension 06/20/2017  . Vocal cord dysfunction 06/20/2017  . Obesity (BMI 30.0-34.9) 06/20/2017  . Right ear pain 06/20/2017  . GERD (gastroesophageal reflux disease) 02/03/2016  . Obesity 08/24/2015  . Tobacco abuse   . Cocaine abuse (HCC)   . Leukocytosis   . Anxiety and depression   . Substance abuse (HCC)   . Asthma 07/27/2015  . Frequent falls 05/29/2015  . COPD (chronic obstructive pulmonary disease) (HCC) 05/08/2015  . Alcohol abuse   . Depression   . Adjustment disorder with anxious mood   . Hereditary and idiopathic peripheral neuropathy   . Chronic asthmatic bronchitis (HCC) 08/21/2014  . Cough    . Polysubstance abuse (HCC) 07/28/2014  . Generalized anxiety disorder 01/20/2014  . Panic attacks 01/20/2014  . Upper airway cough syndrome, severe, with clinical VCD 12/07/2013  . Stridor 10/29/2013  . Anemia 08/04/2013  . Tobacco use disorder 06/17/2013  . Chronic pain syndrome 06/17/2013    Past Surgical History:  Procedure Laterality Date  . SPINAL FUSION  11/2012   Performed in [name] by Dr. Mayford Knife  . TRACHEOSTOMY  December 2015  . TRACHEOSTOMY CLOSURE       OB History   No obstetric history on file.     Family History  Problem Relation Age of Onset  . HIV Mother   . Heart disease Father   . CVA Father   . Heart disease Other   . Emphysema Maternal Grandmother        smoked  . Clotting disorder Maternal Grandmother   . Lung cancer Maternal Grandmother        smoked  . Asthma Sister   . Clotting disorder Sister     Social History   Tobacco Use  . Smoking status: Former Smoker    Packs/day: 0.00    Years: 17.00    Pack years: 0.00    Types: Cigarettes    Quit date: 06/15/2014    Years since quitting: 6.3  . Smokeless tobacco: Never Used  Vaping Use  .  Vaping Use: Never used  Substance Use Topics  . Alcohol use: Yes    Alcohol/week: 0.0 standard drinks    Comment: occasional  . Drug use: Yes    Types: Cocaine    Comment: 2017    Home Medications Prior to Admission medications   Medication Sig Start Date End Date Taking? Authorizing Provider  acetaminophen (TYLENOL) 325 MG tablet Take 2 tablets (650 mg total) every 6 (six) hours as needed by mouth for mild pain (or Fever >/= 101). 06/22/17   Marguerita MerlesSheikh, Omair Latif, DO  acetaminophen (TYLENOL) 500 MG tablet Take 500 mg by mouth every 6 (six) hours as needed for moderate pain.    [provider]  albuterol (PROVENTIL HFA;VENTOLIN HFA) 108 (90 Base) MCG/ACT inhaler Inhale 2 puffs into the lungs every 6 (six) hours as needed for wheezing or shortness of breath. Patient not taking:  Reported on 02/05/2020 10/25/16   Hoy RegisterNewlin, Enobong, MD  albuterol (PROVENTIL) (2.5 MG/3ML) 0.083% nebulizer solution Take 3 mLs (2.5 mg total) by nebulization every 6 (six) hours as needed for wheezing or shortness of breath. 08/05/20   Khatri, Hina, PA-C  albuterol (VENTOLIN HFA) 108 (90 Base) MCG/ACT inhaler Inhale 1-2 puffs into the lungs every 6 (six) hours as needed for wheezing or shortness of breath. 08/05/20   Khatri, Hina, PA-C  ALPRAZolam (XANAX) 0.5 MG tablet Take 1 tablet (0.5 mg total) by mouth 3 (three) times daily as needed for sleep or anxiety. Take 1 tablet (0.5 mg) by mouth 3 times daily as needed for panic attacks. 02/06/20   Elige Radonhristian, Rylee, MD  fluticasone (FLONASE) 50 MCG/ACT nasal spray Place 2 sprays into both nostrils 2 (two) times daily. 02/03/16   Storm FriskWright, Patrick E, MD  fluticasone furoate-vilanterol (BREO ELLIPTA) 200-25 MCG/INH AEPB Inhale 1 puff into the lungs daily. 02/06/20   Elige Radonhristian, Rylee, MD  gabapentin (NEURONTIN) 300 MG capsule Take 2 capsules (600 mg total) by mouth 2 (two) times daily. 02/03/16   Storm FriskWright, Patrick E, MD  guaiFENesin (MUCINEX) 600 MG 12 hr tablet Take 2 tablets (1,200 mg total) 2 (two) times daily by mouth. 06/22/17   Sheikh, Omair Latif, DO  ipratropium-albuterol (DUONEB) 0.5-2.5 (3) MG/3ML SOLN Take 3 mLs by nebulization every 4 (four) hours as needed (shortness of breath/wheezing). 02/04/20   Elige Radonhristian, Rylee, MD  meloxicam (MOBIC) 7.5 MG tablet Take 7.5 mg daily by mouth. 06/06/17   [provider]  Oxycodone HCl 10 MG TABS Take 10 mg by mouth every 6 (six) hours as needed for pain.     [provider]  pantoprazole (PROTONIX) 40 MG tablet Take 1 tablet (40 mg total) 2 (two) times daily by mouth. 06/22/17   Sheikh, Omair Latif, DO  verapamil (VERELAN) 100 MG 24 hr capsule Take 100 mg by mouth at bedtime.    [provider]  XTAMPZA ER 13.5 MG C12A Take 13.5 mg by mouth 2 (two) times daily. 09/27/19   [provider]   zolpidem (AMBIEN) 10 MG tablet Take 10 mg by mouth at bedtime. Reported on 12/03/2015 04/30/15   [provider]    Allergies    Cellulose; Robitussin dm [dextromethorphan-guaifenesin]; Chocolate; Nsaids; Suboxone [buprenorphine hcl-naloxone hcl]; Nsaids; Rayon, purified; Tolmetin; and Tramadol  Review of Systems   Review of Systems  All other systems reviewed and are negative.   Physical Exam Updated Vital Signs BP (!) 137/95 (BP Location: Right Arm)   Pulse (!) 109   Temp 99.4 F (37.4 C) (Oral)  Resp (!) 30   SpO2 100%   Physical Exam Vitals and nursing note reviewed.  Constitutional:      General: She is in acute distress.     Appearance: She is well-developed. She is ill-appearing.  HENT:     Head: Normocephalic and atraumatic.     Right Ear: External ear normal.     Left Ear: External ear normal.  Eyes:     General: No scleral icterus.       Right eye: No discharge.        Left eye: No discharge.     Conjunctiva/sclera: Conjunctivae normal.  Neck:     Trachea: No tracheal deviation.  Cardiovascular:     Rate and Rhythm: Normal rate and regular rhythm.  Pulmonary:     Breath sounds: Normal breath sounds. No stridor. No rales.     Comments: Tachypnea, increased work of breathing Abdominal:     General: Bowel sounds are normal. There is no distension.     Palpations: Abdomen is soft.     Tenderness: There is no abdominal tenderness. There is no guarding or rebound.  Musculoskeletal:        General: No tenderness.     Cervical back: Neck supple.  Skin:    General: Skin is warm and dry.     Findings: No rash.  Neurological:     Mental Status: She is alert.     Cranial Nerves: No cranial nerve deficit (no facial droop, extraocular movements intact, no slurred speech).     Sensory: No sensory deficit.     Motor: No abnormal muscle tone or seizure activity.     Coordination: Coordination normal.     ED Results / Procedures / Treatments   Labs (all  labs ordered are listed, but only abnormal results are displayed) Labs Reviewed  BASIC METABOLIC PANEL - Abnormal; Notable for the following components:      Result Value   CO2 21 (*)    Glucose, Bld 122 (*)    All other components within normal limits  CBC - Abnormal; Notable for the following components:   WBC 11.8 (*)    MCH 25.7 (*)    All other components within normal limits  I-STAT ARTERIAL BLOOD GAS, ED - Abnormal; Notable for the following components:   pO2, Arterial 162 (*)    All other components within normal limits  RESP PANEL BY RT-PCR (FLU A&B, COVID) [id number]    EKG EKG Interpretation  Date/Time:  Sunday October 25 2020 14:14:12 EDT Ventricular Rate:  110 PR Interval:    QRS Duration: 83 QT Interval:  345 QTC Calculation: 467 R Axis:   56 Text Interpretation: Sinus tachycardia Consider left ventricular hypertrophy Baseline wander in lead(s) V1 V2 V6 No significant change since last tracing Confirmed by Linwood Dibbles [phone]) on 10/25/2020 2:16:21 PM   Radiology DG Chest Port 1 View  Result Date: 10/25/2020 CLINICAL DATA:  Shortness of breath EXAM: PORTABLE CHEST 1 VIEW COMPARISON:  August 05, 2020 FINDINGS: The lungs are clear. The heart size and pulmonary vascularity are normal. No adenopathy. No bone lesions. IMPRESSION: Lungs clear.  Cardiac silhouette normal. Electronically Signed   By: Bretta Bang III M.D.   On: 10/25/2020 14:30    Procedures .Critical Care Performed by: Linwood Dibbles, MD Authorized by: Linwood Dibbles, MD   Critical care provider statement:    Critical care time (minutes):  30   Critical care was time spent personally by me on the  following activities:  Discussions with consultants, evaluation of patient's response to treatment, examination of patient, ordering and performing treatments and interventions, ordering and review of laboratory studies, ordering and review of radiographic studies, pulse oximetry, re-evaluation of patient's condition,  obtaining history from patient or surrogate and review of old charts     Medications Ordered in ED Medications  albuterol (PROVENTIL,VENTOLIN) solution continuous neb (10 mg/hr Nebulization Given 10/25/20 1425)  sodium chloride 0.9 % bolus 1,000 mL (1,000 mLs Intravenous New Bag/Given 10/25/20 1450)    Followed by  0.9 %  sodium chloride infusion (has no administration in time range)  LORazepam (ATIVAN) injection 0.5 mg (0.5 mg Intravenous Given 10/25/20 1448)    ED Course  I have reviewed the triage vital signs and the nursing notes.  Pertinent labs & imaging results that were available during my care of the patient were reviewed by me and considered in my medical decision making (see chart for details).  Clinical Course as of 10/25/20 1519  Sun Oct 25, 2020  1443 Patient understandably anxious about her breathing but right now no signs of hypoxia or hypercarbia.  She is receiving a breathing treatment.  We will give her a small dose of Ativan to help with some of her anxiety and stress [JK]  1518 Labs reviewed.  No significant abnormalities.  No acidosis or hypercarbia [JK]  1519 Chest x-ray without pneumonia.  Patient appears more comfortable at the bedside [JK]    Clinical Course User Index [JK] Linwood Dibbles, MD   MDM Rules/Calculators/A&P                          Patient presented to the ED for evaluation of acute shortness of breath.  Patient has history of severe asthma requiring intubation as well as tracheostomy in the past.  Patient unfortunately continues to smoke cigarettes.  She also ran out of her medications a few days ago.  Patient was in severe distress initially evaluate by EMS.  She was given breathing treatments, magnesium Solu-Medrol.  Patient initially in the ED was very anxious and was still having respiratory difficulty.  Hour long nebs has been started.  Patient is showing some improvement.  Will continue to monitor closely but no indication for intubation at this  time.  Care turned over to Dr Particia Nearing . Final Clinical Impression(s) / ED Diagnoses Final diagnoses:  Severe asthma with exacerbation, unspecified whether persistent      Linwood Dibbles, MD 10/25/20 1521

## 2020-10-25 NOTE — ED Notes (Signed)
2nd attempt to call report.  

## 2020-10-25 NOTE — ED Notes (Signed)
The pt will not keep her bp cuff in place  She is coughing constgantly and coughs right in any person s face  She reports that she has vomited and has wiped it up with her gown.  She is now asking for water and something for her cough

## 2020-10-25 NOTE — ED Provider Notes (Signed)
Pt signed out by Dr. Lynelle Doctor.  Pt is still very sob and wheezy.  I ordered an additional continuous neb which has improved her air movement, but she is still very sob with a cough.  Pt's Covid is negative.  Pt d/w Dr. Mikeal Hawthorne (triad) for admission.  CRITICAL CARE Performed by: Jacalyn Lefevre   Total critical care time: 30 minutes  Critical care time was exclusive of separately billable procedures and treating other patients.  Critical care was necessary to treat or prevent imminent or life-threatening deterioration.  Critical care was time spent personally by me on the following activities: development of treatment plan with patient and/or surrogate as well as nursing, discussions with consultants, evaluation of patient's response to treatment, examination of patient, obtaining history from patient or surrogate, ordering and performing treatments and interventions, ordering and review of laboratory studies, ordering and review of radiographic studies, pulse oximetry and re-evaluation of patient's condition.   Jacalyn Lefevre, MD 10/25/20 [phone]

## 2020-10-25 NOTE — ED Notes (Signed)
Unsuccessful attempt to call report  Pt has not been  Assigned to a  nurse

## 2020-10-25 NOTE — ED Notes (Signed)
covid swab sent appro 20 minutes ago  I called [employer]  Was told it was received 5 minutes ago

## 2020-10-25 NOTE — ED Triage Notes (Addendum)
Pt BIBA from home with increased work of breathing for 2 days and a congested cough. Pt has been out of her asthma meds since yesterday. Pt has a hx of 5 intubations including a trach, asthma, and COPD. Strong congested cough assessed. Pt smokes a quarter of a pack of cigarettes a day. Pt rec'd 2G mag, 125mg  Solumedrol, and 2 Duo nebs en route.

## 2020-10-25 NOTE — ED Notes (Signed)
The pt just asked for pain med   When I walked in the pt sound asleep  snoring

## 2020-10-25 NOTE — ED Notes (Signed)
Pt asking for food and more  Pain med

## 2020-10-26 ENCOUNTER — Encounter (HOSPITAL_COMMUNITY): Payer: Self-pay | Admitting: Internal Medicine

## 2020-10-26 DIAGNOSIS — Z981 Arthrodesis status: Secondary | ICD-10-CM | POA: Diagnosis not present

## 2020-10-26 DIAGNOSIS — Z87891 Personal history of nicotine dependence: Secondary | ICD-10-CM | POA: Diagnosis not present

## 2020-10-26 DIAGNOSIS — D72829 Elevated white blood cell count, unspecified: Secondary | ICD-10-CM | POA: Diagnosis present

## 2020-10-26 DIAGNOSIS — Z91018 Allergy to other foods: Secondary | ICD-10-CM | POA: Diagnosis not present

## 2020-10-26 DIAGNOSIS — T380X5A Adverse effect of glucocorticoids and synthetic analogues, initial encounter: Secondary | ICD-10-CM | POA: Diagnosis present

## 2020-10-26 DIAGNOSIS — K219 Gastro-esophageal reflux disease without esophagitis: Secondary | ICD-10-CM | POA: Diagnosis present

## 2020-10-26 DIAGNOSIS — E440 Moderate protein-calorie malnutrition: Secondary | ICD-10-CM | POA: Diagnosis present

## 2020-10-26 DIAGNOSIS — G894 Chronic pain syndrome: Secondary | ICD-10-CM | POA: Diagnosis present

## 2020-10-26 DIAGNOSIS — Z20822 Contact with and (suspected) exposure to covid-19: Secondary | ICD-10-CM | POA: Diagnosis present

## 2020-10-26 DIAGNOSIS — Z888 Allergy status to other drugs, medicaments and biological substances status: Secondary | ICD-10-CM | POA: Diagnosis not present

## 2020-10-26 DIAGNOSIS — Z6830 Body mass index (BMI) 30.0-30.9, adult: Secondary | ICD-10-CM | POA: Diagnosis not present

## 2020-10-26 DIAGNOSIS — Z886 Allergy status to analgesic agent status: Secondary | ICD-10-CM | POA: Diagnosis not present

## 2020-10-26 DIAGNOSIS — Z79899 Other long term (current) drug therapy: Secondary | ICD-10-CM | POA: Diagnosis not present

## 2020-10-26 DIAGNOSIS — Z825 Family history of asthma and other chronic lower respiratory diseases: Secondary | ICD-10-CM | POA: Diagnosis not present

## 2020-10-26 DIAGNOSIS — E669 Obesity, unspecified: Secondary | ICD-10-CM | POA: Diagnosis present

## 2020-10-26 DIAGNOSIS — I10 Essential (primary) hypertension: Secondary | ICD-10-CM | POA: Diagnosis present

## 2020-10-26 DIAGNOSIS — F17200 Nicotine dependence, unspecified, uncomplicated: Secondary | ICD-10-CM | POA: Diagnosis not present

## 2020-10-26 DIAGNOSIS — Z8249 Family history of ischemic heart disease and other diseases of the circulatory system: Secondary | ICD-10-CM | POA: Diagnosis not present

## 2020-10-26 DIAGNOSIS — F4322 Adjustment disorder with anxiety: Secondary | ICD-10-CM | POA: Diagnosis present

## 2020-10-26 DIAGNOSIS — J45901 Unspecified asthma with (acute) exacerbation: Secondary | ICD-10-CM | POA: Diagnosis present

## 2020-10-26 DIAGNOSIS — R739 Hyperglycemia, unspecified: Secondary | ICD-10-CM | POA: Diagnosis present

## 2020-10-26 DIAGNOSIS — R7303 Prediabetes: Secondary | ICD-10-CM | POA: Diagnosis present

## 2020-10-26 DIAGNOSIS — Z23 Encounter for immunization: Secondary | ICD-10-CM | POA: Diagnosis present

## 2020-10-26 DIAGNOSIS — J449 Chronic obstructive pulmonary disease, unspecified: Secondary | ICD-10-CM | POA: Diagnosis present

## 2020-10-26 LAB — D-DIMER, QUANTITATIVE: D-Dimer, Quant: <0.27 ug/mL-FEU (ref 0.00–0.50)

## 2020-10-26 LAB — HEMOGLOBIN A1C
Hgb A1c MFr Bld: 6 % — ABNORMAL HIGH (ref 4.8–5.6)
Mean Plasma Glucose: 125.5 mg/dL

## 2020-10-26 LAB — CBC
HCT: 33.4 % — ABNORMAL LOW (ref 36.0–46.0)
Hemoglobin: 10.5 g/dL — ABNORMAL LOW (ref 12.0–15.0)
MCH: 25.7 pg — ABNORMAL LOW (ref 26.0–34.0)
MCHC: 31.4 g/dL (ref 30.0–36.0)
MCV: 81.9 fL (ref 80.0–100.0)
Platelets: 274 10*3/uL (ref 150–400)
RBC: 4.08 MIL/uL (ref 3.87–5.11)
RDW: 14.6 % (ref 11.5–15.5)
WBC: 13.4 10*3/uL — ABNORMAL HIGH (ref 4.0–10.5)
nRBC: 0 % (ref 0.0–0.2)

## 2020-10-26 LAB — COMPREHENSIVE METABOLIC PANEL
ALT: 19 U/L (ref 0–44)
AST: 19 U/L (ref 15–41)
Albumin: 3.3 g/dL — ABNORMAL LOW (ref 3.5–5.0)
Alkaline Phosphatase: 60 U/L (ref 38–126)
Anion gap: 6 (ref 5–15)
BUN: 12 mg/dL (ref 6–20)
CO2: 18 mmol/L — ABNORMAL LOW (ref 22–32)
Calcium: 9 mg/dL (ref 8.9–10.3)
Chloride: 109 mmol/L (ref 98–111)
Creatinine, Ser: 0.91 mg/dL (ref 0.44–1.00)
GFR, Estimated: >60 mL/min (ref >60)
Glucose, Bld: 336 mg/dL — ABNORMAL HIGH (ref 70–99)
Potassium: 4.1 mmol/L (ref 3.5–5.1)
Sodium: 133 mmol/L — ABNORMAL LOW (ref 135–145)
Total Bilirubin: 0.6 mg/dL (ref 0.3–1.2)
Total Protein: 6.8 g/dL (ref 6.5–8.1)

## 2020-10-26 LAB — GLUCOSE, CAPILLARY
Glucose-Capillary: 159 mg/dL — ABNORMAL HIGH (ref 70–99)
Glucose-Capillary: 165 mg/dL — ABNORMAL HIGH (ref 70–99)
Glucose-Capillary: 261 mg/dL — ABNORMAL HIGH (ref 70–99)

## 2020-10-26 LAB — HIV ANTIBODY (ROUTINE TESTING W REFLEX): HIV Screen 4th Generation wRfx: NONREACTIVE

## 2020-10-26 MED ORDER — OXYCODONE-ACETAMINOPHEN 5-325 MG PO TABS
1.0000 | ORAL_TABLET | ORAL | Status: AC | PRN
  Administered 2020-10-26 (×2): 1 via ORAL
  Filled 2020-10-26 (×2): qty 1

## 2020-10-26 MED ORDER — OXYCODONE-ACETAMINOPHEN 5-325 MG PO TABS
1.0000 | ORAL_TABLET | ORAL | Status: AC | PRN
  Administered 2020-10-26 – 2020-10-27 (×2): 1 via ORAL
  Filled 2020-10-26 (×2): qty 1

## 2020-10-26 MED ORDER — DIPHENHYDRAMINE HCL 25 MG PO CAPS
25.0000 mg | ORAL_CAPSULE | Freq: Four times a day (QID) | ORAL | Status: DC | PRN
  Administered 2020-10-26 – 2020-10-29 (×4): 25 mg via ORAL
  Filled 2020-10-26 (×4): qty 1

## 2020-10-26 MED ORDER — INSULIN ASPART 100 UNIT/ML ~~LOC~~ SOLN
0.0000 [IU] | Freq: Three times a day (TID) | SUBCUTANEOUS | Status: DC
  Administered 2020-10-26: 3 [IU] via SUBCUTANEOUS
  Administered 2020-10-27: 5 [IU] via SUBCUTANEOUS
  Administered 2020-10-28: 11 [IU] via SUBCUTANEOUS
  Administered 2020-10-28 – 2020-10-29 (×2): 5 [IU] via SUBCUTANEOUS

## 2020-10-26 MED ORDER — METHYLPREDNISOLONE SODIUM SUCC 40 MG IJ SOLR
40.0000 mg | Freq: Four times a day (QID) | INTRAMUSCULAR | Status: DC
  Administered 2020-10-26 – 2020-10-27 (×3): 40 mg via INTRAVENOUS
  Filled 2020-10-26 (×3): qty 1

## 2020-10-26 MED ORDER — INSULIN ASPART 100 UNIT/ML ~~LOC~~ SOLN
0.0000 [IU] | Freq: Every day | SUBCUTANEOUS | Status: DC
  Administered 2020-10-26: 3 [IU] via SUBCUTANEOUS
  Administered 2020-10-27: 2 [IU] via SUBCUTANEOUS
  Administered 2020-10-28: 5 [IU] via SUBCUTANEOUS

## 2020-10-26 MED ORDER — BENZONATATE 100 MG PO CAPS
100.0000 mg | ORAL_CAPSULE | Freq: Three times a day (TID) | ORAL | Status: DC | PRN
  Administered 2020-10-27: 100 mg via ORAL
  Filled 2020-10-26: qty 1

## 2020-10-26 NOTE — Progress Notes (Addendum)
PROGRESS NOTE                                                                             PROGRESS NOTE                                                                                                                                                                                                             Patient Demographics:    Julie Kerr, is a 45 y.o. female, DOB - 09/26/1975, QIW:[id number]  Outpatient Primary MD for the patient is Pcp, No    LOS - 0  Admit date - 10/25/2020    Chief Complaint  Patient presents with  . Respiratory Distress       Brief Narrative    Julie Kerr is a 45 y.o. female with medical history significant of asthma and COPD, polysubstance abuse including tobacco and cocaine, vocal cord dysfunction, hypertension, anxiety disorder who presented to the ER with shortness of breath for the last few days.  Symptoms consistent with her typical asthma attack.  Denied any chest pain.  Has been coughing thick sputum.  Patient complained of significant chest wall pain from the cough.  No fever.  She has tried all her home medications with no relief.  Patient has also been given multiple treatments in the ER with no relief.  Patient therefore being admitted to the hospital for further evaluation and treatment.  She denied any exposure to COVID-19.  No pneumonia identified at this point.  She is very anxious otherwise..  ED Course: Temperature 99.4 blood pressure 88/65 initially with pulse 140 respirate 30 oxygen sat 90% room air.  White count 11.8 hemoglobin and platelets all within normal.  CO2 of 21 otherwise  creatinine is normal.  COVID-19 negative. CXR negative.    Subjective:    Julie Kerr today complains of musculoskeletal chest pain overnight related to cough, still reports dyspnea, cough, productive.     Assessment  & Plan :    Principal Problem:   Asthma exacerbation Active Problems:  Tobacco use disorder    Adjustment disorder with anxious mood   GERD (gastroesophageal reflux disease)   Chronic narcotic dependence (HCC)    acute exacerbation of asthma: -She is with reactive airway disease, most likely combination between COPD and asthma -Remains tachypneic with increased work of breathing, with diffuse wheezing, so I will continue with her IV Solu-Medrol, continue with IV levofloxacin, continue with scheduled duo nebs. -She was encouraged use incentive spirometry and flutter valve.  Nontypical chest pain -Musculoskeletal, related to cough, negative D-dimers, EKG nonacute.  GERD: Continue with PPIs  chronic pain syndrome: Counseling provided  polysubstance abuse: Including tobacco use.  Urine drug screen.  Nicotine patch  Anxiety disorder: Continue home regimen  Tobacco abuse -She was counseled  Hyperglycemia -Likely related to steroids, will check A1c and start insulin sliding scale.  SpO2: 99 %  Recent Labs  Lab 10/25/20 1413 10/25/20 1643 10/26/20 0108  WBC 11.8*  --  13.4*  PLT 311  --  274  DDIMER  --   --  <0.27  AST  --   --  19  ALT  --   --  19  ALKPHOS  --   --  60  BILITOT  --   --  0.6  ALBUMIN  --   --  3.3*  SARSCOV2NAA  --  NEGATIVE  --        ABG     Component Value Date/Time   PHART 7.355 10/25/2020 1417   PCO2ART 42.6 10/25/2020 1417   PO2ART 162 (H) 10/25/2020 1417   HCO3 23.8 10/25/2020 1417   TCO2 25 10/25/2020 1417   ACIDBASEDEF 2.0 10/25/2020 1417   O2SAT 99.0 10/25/2020 1417         Condition - Extremely Guarded  Family Communication  :  None at bedside  Code Status :  full  Consults  :  none  Disposition Plan  :    Status is: Observation  The patient will require care spanning > 2 midnights and should be moved to inpatient because: IV treatments appropriate due to intensity of illness or inability to take PO  Dispo: The patient is from: Home              Anticipated d/c is to: Home              Patient  currently is not medically stable to d/c.  Patient remains with significant wheezing, and aspiratory status far from her baseline, she still in need of IV steroids and antibiotics.   Difficult to place patient No      DVT Prophylaxis  :  Lovenox   Lab Results  Component Value Date   PLT 274 10/26/2020    Diet :  Diet Order            Diet regular Room service appropriate? Yes; Fluid consistency: Thin  Diet effective now                  Inpatient Medications  Scheduled Meds: . enoxaparin (LOVENOX) injection  40 mg Subcutaneous Q24H  . ipratropium-albuterol  3 mL Nebulization Q6H  . methylPREDNISolone (SOLU-MEDROL) injection  40 mg Intravenous Q6H  . mometasone-formoterol  2 puff Inhalation BID   Continuous Infusions: . albuterol Stopped (10/25/20 1703)  . levofloxacin (LEVAQUIN) IV Stopped (10/25/20 2051)   PRN Meds:.albuterol, chlorpheniramine-HYDROcodone, diphenhydrAMINE  Antibiotics  :    Anti-infectives (From admission, onward)   Start     Dose/Rate Route Frequency Ordered Stop  10/25/20 1930  levofloxacin (LEVAQUIN) IVPB 750 mg        750 mg 100 mL/hr over 90 Minutes Intravenous Every 24 hours 10/25/20 1916 10/30/20 1929      Dawood Elgergawy M.D on 10/26/2020 at 11:15 AM  To page go to [email or web address]  [facility] -  Office  [phone]     Objective:   Vitals:   10/26/20 0010 10/26/20 0358 10/26/20 0400 10/26/20 1012  BP: (!) 109/51  128/73   Pulse: (!) 109  (!) 111 94  Resp: 20  18   Temp: 98 F (36.7 C)  97.8 F (36.6 C)   TempSrc: Axillary  Oral   SpO2: 100% 100% 99%     Wt Readings from Last 3 Encounters:  08/05/20 83.9 kg  02/02/20 84 kg  03/09/19 83.9 kg     Intake/Output Summary (Last 24 hours) at 10/26/2020 1115 Last data filed at 10/26/2020 0100 Gross per 24 hour  Intake 40.45 ml  Output 400 ml  Net -359.55 ml     Physical Exam  Awake Alert, No new F.N deficits, Normal affect Diminished air entry bilaterally  and diffuse wheezing, some mild increased work of breathing, respiratory rate in the mid 30s currently. RRR,No Gallops,Rubs or new Murmurs, No Parasternal Heave +ve B.Sounds, Abd Soft, No tenderness, No organomegaly appriciated, No rebound - guarding or rigidity. No Cyanosis, Clubbing or edema, No new Rash or bruise      Data Review:    CBC Recent Labs  Lab 10/25/20 1413 10/25/20 1417 10/26/20 0108  WBC 11.8*  --  13.4*  HGB 12.3 12.9 10.5*  HCT 39.7 38.0 33.4*  PLT 311  --  274  MCV 82.9  --  81.9  MCH 25.7*  --  25.7*  MCHC 31.0  --  31.4  RDW 14.8  --  14.6    Recent Labs  Lab 10/25/20 1413 10/25/20 1417 10/26/20 0108  NA 137 139 133*  K 4.0 3.7 4.1  CL 106  --  109  CO2 21*  --  18*  GLUCOSE 122*  --  336*  BUN 11  --  12  CREATININE 0.85  --  0.91  CALCIUM 9.4  --  9.0  AST  --   --  19  ALT  --   --  19  ALKPHOS  --   --  60  BILITOT  --   --  0.6  ALBUMIN  --   --  3.3*  DDIMER  --   --  <0.27    ------------------------------------------------------------------------------------------------------------------ No results for input(s): CHOL, HDL, LDLCALC, TRIG, CHOLHDL, LDLDIRECT in the last 72 hours.  Lab Results  Component Value Date   HGBA1C 5.8 (H) 06/22/2017   ------------------------------------------------------------------------------------------------------------------ No results for input(s): TSH, T4TOTAL, T3FREE, THYROIDAB in the last 72 hours.  Invalid input(s): FREET3  Cardiac Enzymes No results for input(s): CKMB, TROPONINI, MYOGLOBIN in the last 168 hours.  Invalid input(s): CK ------------------------------------------------------------------------------------------------------------------    Component Value Date/Time   BNP 8.4 11/04/2015 1005    Micro Results Recent Results (from the past 240 hour(s))  Resp Panel by RT-PCR (Flu A&B, Covid) Nasopharyngeal Swab     Status: None   Collection Time: 10/25/20  4:43 PM   Specimen:  Nasopharyngeal Swab; Nasopharyngeal(NP) swabs in vial transport medium  Result Value Ref Range Status   SARS Coronavirus 2 by RT PCR NEGATIVE NEGATIVE Final    Comment: (NOTE) SARS-CoV-2 target nucleic acids are NOT DETECTED.  The SARS-CoV-2  RNA is generally detectable in upper respiratory specimens during the acute phase of infection. The lowest concentration of SARS-CoV-2 viral copies this assay can detect is 138 copies/mL. A negative result does not preclude SARS-Cov-2 infection and should not be used as the sole basis for treatment or other patient management decisions. A negative result may occur with  improper specimen collection/handling, submission of specimen other than nasopharyngeal swab, presence of viral mutation(s) within the areas targeted by this assay, and inadequate number of viral copies(<138 copies/mL). A negative result must be combined with clinical observations, patient history, and epidemiological information. The expected result is Negative.  Fact Sheet for Patients:  BloggerCourse.com  Fact Sheet for Healthcare Providers:  SeriousBroker.it  This test is no t yet approved or cleared by the [place] FDA and  has been authorized for detection and/or diagnosis of SARS-CoV-2 by FDA under an Emergency Use Authorization (EUA). This EUA will remain  in effect (meaning this test can be used) for the duration of the COVID-19 declaration under Section 564(b)(1) of the Act, 21 U.S.C.section 360bbb-3(b)(1), unless the authorization is terminated  or revoked sooner.       Influenza A by PCR NEGATIVE NEGATIVE Final   Influenza B by PCR NEGATIVE NEGATIVE Final    Comment: (NOTE) The Xpert Xpress SARS-CoV-2/FLU/RSV plus assay is intended as an aid in the diagnosis of influenza from Nasopharyngeal swab specimens and should not be used as a sole basis for treatment. Nasal washings and aspirates are unacceptable for  Xpert Xpress SARS-CoV-2/FLU/RSV testing.  Fact Sheet for Patients: BloggerCourse.com  Fact Sheet for Healthcare Providers: SeriousBroker.it  This test is not yet approved or cleared by the [place] FDA and has been authorized for detection and/or diagnosis of SARS-CoV-2 by FDA under an Emergency Use Authorization (EUA). This EUA will remain in effect (meaning this test can be used) for the duration of the COVID-19 declaration under Section 564(b)(1) of the Act, 21 U.S.C. section 360bbb-3(b)(1), unless the authorization is terminated or revoked.  Performed at [facility], [address] [place], [place] [address]     Radiology Reports DG Chest [place] 1 View  Result Date: 10/25/2020 CLINICAL DATA:  Shortness of breath EXAM: PORTABLE CHEST 1 VIEW COMPARISON:  August 05, 2020 FINDINGS: The lungs are clear. The heart size and pulmonary vascularity are normal. No adenopathy. No bone lesions. IMPRESSION: Lungs clear.  Cardiac silhouette normal. Electronically Signed   By: Bretta Bang III M.D.   On: 10/25/2020 14:30
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
## 2020-10-27 DIAGNOSIS — E440 Moderate protein-calorie malnutrition: Secondary | ICD-10-CM | POA: Insufficient documentation

## 2020-10-27 LAB — CBC
HCT: 35.8 % — ABNORMAL LOW (ref 36.0–46.0)
Hemoglobin: 11.1 g/dL — ABNORMAL LOW (ref 12.0–15.0)
MCH: 25.6 pg — ABNORMAL LOW (ref 26.0–34.0)
MCHC: 31 g/dL (ref 30.0–36.0)
MCV: 82.7 fL (ref 80.0–100.0)
Platelets: 317 10*3/uL (ref 150–400)
RBC: 4.33 MIL/uL (ref 3.87–5.11)
RDW: 15 % (ref 11.5–15.5)
WBC: 25.8 10*3/uL — ABNORMAL HIGH (ref 4.0–10.5)
nRBC: 0 % (ref 0.0–0.2)

## 2020-10-27 LAB — BASIC METABOLIC PANEL
Anion gap: 8 (ref 5–15)
BUN: 12 mg/dL (ref 6–20)
CO2: 22 mmol/L (ref 22–32)
Calcium: 9.5 mg/dL (ref 8.9–10.3)
Chloride: 103 mmol/L (ref 98–111)
Creatinine, Ser: 0.85 mg/dL (ref 0.44–1.00)
GFR, Estimated: >60 mL/min (ref >60)
Glucose, Bld: 309 mg/dL — ABNORMAL HIGH (ref 70–99)
Potassium: 5.5 mmol/L — ABNORMAL HIGH (ref 3.5–5.1)
Sodium: 133 mmol/L — ABNORMAL LOW (ref 135–145)

## 2020-10-27 LAB — GLUCOSE, CAPILLARY
Glucose-Capillary: 213 mg/dL — ABNORMAL HIGH (ref 70–99)
Glucose-Capillary: 117 mg/dL — ABNORMAL HIGH (ref 70–99)
Glucose-Capillary: 116 mg/dL — ABNORMAL HIGH (ref 70–99)
Glucose-Capillary: 226 mg/dL — ABNORMAL HIGH (ref 70–99)

## 2020-10-27 MED ORDER — ADULT MULTIVITAMIN W/MINERALS CH
1.0000 | ORAL_TABLET | Freq: Every day | ORAL | Status: DC
  Administered 2020-10-27 – 2020-10-29 (×3): 1 via ORAL
  Filled 2020-10-27 (×3): qty 1

## 2020-10-27 MED ORDER — OXYCODONE-ACETAMINOPHEN 5-325 MG PO TABS
1.0000 | ORAL_TABLET | Freq: Four times a day (QID) | ORAL | Status: DC | PRN
  Administered 2020-10-27 – 2020-10-29 (×7): 1 via ORAL
  Filled 2020-10-27 (×7): qty 1

## 2020-10-27 MED ORDER — INFLUENZA VAC SPLIT QUAD 0.5 ML IM SUSY
0.5000 mL | PREFILLED_SYRINGE | INTRAMUSCULAR | Status: AC
  Administered 2020-10-28: 0.5 mL via INTRAMUSCULAR
  Filled 2020-10-27: qty 0.5

## 2020-10-27 MED ORDER — IPRATROPIUM-ALBUTEROL 0.5-2.5 (3) MG/3ML IN SOLN
3.0000 mL | Freq: Two times a day (BID) | RESPIRATORY_TRACT | Status: DC
  Administered 2020-10-28 – 2020-10-29 (×3): 3 mL via RESPIRATORY_TRACT
  Filled 2020-10-27 (×3): qty 3

## 2020-10-27 MED ORDER — ENSURE ENLIVE PO LIQD
237.0000 mL | Freq: Three times a day (TID) | ORAL | Status: DC
  Administered 2020-10-27 – 2020-10-29 (×5): 237 mL via ORAL

## 2020-10-27 MED ORDER — METHYLPREDNISOLONE SODIUM SUCC 40 MG IJ SOLR
40.0000 mg | Freq: Three times a day (TID) | INTRAMUSCULAR | Status: DC
  Administered 2020-10-27 – 2020-10-28 (×3): 40 mg via INTRAVENOUS
  Filled 2020-10-27 (×3): qty 1

## 2020-10-27 NOTE — TOC Initial Note (Addendum)
Transition of Care [facility]) - Initial/Assessment Note    Patient Details  Name: Julie Kerr MRN: [id number] Date of Birth: [birth year]  [facility]) CM/SW Contact:    Lawerance Sabal, RN Phone Number: 10/27/2020, 4:36 PM  Clinical Narrative:                 Spoke to patient at bedside to discuss DC plans. She confirms that she lives at home with her 21 and 43 year old sons. She has a HHA 2 hours a day everyday and a RW. She states that she has [employer], a [employer], [employer] all near by for food. She uses taxies and [employer] to get to appointments.  She is interested in a nebulizer for breathing treatments at home, instructed her to request this from MD. Recs for [facility] PT patient would like [facility] services. Will need orders.    Expected Discharge Plan: Home w Home Health Services Barriers to Discharge: Continued Medical Work up   Patient Goals and CMS Choice Patient states their goals for this hospitalization and ongoing recovery are:: to go home      Expected Discharge Plan and Services Expected Discharge Plan: Home w Home Health Services   Discharge Planning Services: CM Consult Post Acute Care Choice: Home Health Living arrangements for the past 2 months: Single Family Home                                      Prior Living Arrangements/Services Living arrangements for the past 2 months: Single Family Home Lives with:: Self,Adult Children,Minor Children              Current home services: DME,Homehealth aide    Activities of Daily Living Home Assistive Devices/Equipment: None ADL Screening (condition at time of admission) Patient's cognitive ability adequate to safely complete daily activities?: Yes Is the patient deaf or have difficulty hearing?: No Does the patient have difficulty seeing, even when wearing glasses/contacts?: No Does the patient have difficulty concentrating, remembering, or making decisions?: No Patient able to express need for  assistance with ADLs?: Yes Does the patient have difficulty dressing or bathing?: No Independently performs ADLs?: Yes (appropriate for developmental age) Does the patient have difficulty walking or climbing stairs?: No Weakness of Legs: None Weakness of Arms/Hands: None  Permission Sought/Granted                  Emotional Assessment              Admission diagnosis:  Asthma attack [J45.901] Severe asthma with exacerbation, unspecified whether persistent [J45.901] Patient Active Problem List   Diagnosis Date Noted  . Malnutrition of moderate degree 10/27/2020  . Asthma attack 10/25/2020  . Asthma exacerbation 02/02/2020  . Acute dyspnea 06/20/2017  . Chronic narcotic dependence (HCC) 06/20/2017  . Hypertension 06/20/2017  . Vocal cord dysfunction 06/20/2017  . Obesity (BMI 30.0-34.9) 06/20/2017  . Right ear pain 06/20/2017  . GERD (gastroesophageal reflux disease) 02/03/2016  . Obesity 08/24/2015  . Tobacco abuse   . Cocaine abuse (HCC)   . Leukocytosis   . Anxiety and depression   . Substance abuse (HCC)   . Asthma 07/27/2015  . Frequent falls 05/29/2015  . COPD (chronic obstructive pulmonary disease) (HCC) 05/08/2015  . Alcohol abuse   . Depression   . Adjustment disorder with anxious mood   . Hereditary and idiopathic peripheral neuropathy   .  Chronic asthmatic bronchitis (HCC) 08/21/2014  . Cough   . Polysubstance abuse (HCC) 07/28/2014  . Generalized anxiety disorder 01/20/2014  . Panic attacks 01/20/2014  . Upper airway cough syndrome, severe, with clinical VCD 12/07/2013  . Stridor 10/29/2013  . Anemia 08/04/2013  . Tobacco use disorder 06/17/2013  . Chronic pain syndrome 06/17/2013   PCP:  Pcp, No Pharmacy:   [facility] [phone] - [place], [place] - [address] AT [address] [place] [address] Phone: [phone] Fax: [phone]     Social Determinants of Health (SDOH)  Interventions    Readmission Risk Interventions No flowsheet data found.

## 2020-10-27 NOTE — Progress Notes (Signed)
PROGRESS NOTE                                                                             PROGRESS NOTE                                                                                                                                                                                                             Patient Demographics:    Julie Kerr, is a 45 y.o. female, DOB - [birth year], PTW:[id number]  Outpatient Primary MD for the patient is Pcp, No    LOS - 1  Admit date - 10/25/2020    Chief Complaint  Patient presents with  . Respiratory Distress       Brief Narrative    Julie Kerr is a 45 y.o. female with medical history significant of asthma and COPD, polysubstance abuse including tobacco and cocaine, vocal cord dysfunction, hypertension, anxiety disorder who presented to the ER with shortness of breath for the last few days.  Symptoms consistent with her typical asthma attack.   COVID-19 negative. CXR negative.  She is with significant wheezing, tachypnea and increased work of breathing, she was admitted for IV steroids and antibiotics..    Subjective:    Julie Kerr today still complains of shortness of breath, cough and dyspnea.     Assessment  & Plan :    Principal Problem:   Asthma exacerbation Active Problems:   Tobacco use disorder   Adjustment disorder with anxious mood   GERD (gastroesophageal reflux disease)   Chronic narcotic dependence (HCC)   Asthma attack   Malnutrition of moderate degree    acute exacerbation of asthma: -She is with reactive airway disease, most likely combination between COPD and asthma -She remains with significant wheezing today as well, and reports she is still dyspneic, and far from her baseline, so I will continue with IV steroids, but I will decrease to every 8 hours, will continue with IV antibiotics, and scheduled duo nebs.   -She was encouraged use incentive spirometry and flutter  valve. -Leukocytosis related to steroids.  Nontypical chest pain -Musculoskeletal, related to cough, negative D-dimers, EKG nonacute.  GERD: Continue with PPIs  chronic pain syndrome: Counseling provided  polysubstance abuse: Including tobacco use.  Urine drug screen.  Nicotine patch  Anxiety disorder: Continue home regimen  Tobacco abuse -She was counseled  Hyperglycemia -Likely related to steroids, her A1c is 6, diagnostic of prediabetes, will continue with insulin sliding scale .  SpO2: 99 %  Recent Labs  Lab 10/25/20 1413 10/25/20 1643 10/26/20 0108 10/27/20 0128  WBC 11.8*  --  13.4* 25.8*  PLT 311  --  274 317  DDIMER  --   --  <0.27  --   AST  --   --  19  --   ALT  --   --  19  --   ALKPHOS  --   --  60  --   BILITOT  --   --  0.6  --   ALBUMIN  --   --  3.3*  --   SARSCOV2NAA  --  NEGATIVE  --   --        ABG     Component Value Date/Time   PHART 7.355 10/25/2020 1417   PCO2ART 42.6 10/25/2020 1417   PO2ART 162 (H) 10/25/2020 1417   HCO3 23.8 10/25/2020 1417   TCO2 25 10/25/2020 1417   ACIDBASEDEF 2.0 10/25/2020 1417   O2SAT 99.0 10/25/2020 1417         Condition - Extremely Guarded  Family Communication  :  None at bedside  Code Status :  full  Consults  :  none  Disposition Plan  :    Status is: Observation  The patient will require care spanning > 2 midnights and should be moved to inpatient because: IV treatments appropriate due to intensity of illness or inability to take PO  Dispo: The patient is from: Home              Anticipated d/c is to: Home              Patient currently is not medically stable to d/c.  Patient remains with significant wheezing, and aspiratory status far from her baseline, she still in need of IV steroids and antibiotics.   Difficult to place patient No      DVT Prophylaxis  :  Lovenox   Lab Results  Component Value Date   PLT 317 10/27/2020    Diet :  Diet Order            Diet  regular Room service appropriate? Yes; Fluid consistency: Thin  Diet effective now                  Inpatient Medications  Scheduled Meds: . enoxaparin (LOVENOX) injection  40 mg Subcutaneous Q24H  . feeding supplement  237 mL Oral TID BM  . [START ON 10/28/2020] influenza vac split quadrivalent PF  0.5 mL Intramuscular Tomorrow-1000  . insulin aspart  0-15 Units Subcutaneous TID WC  . insulin aspart  0-5 Units Subcutaneous QHS  . ipratropium-albuterol  3 mL Nebulization Q6H  . methylPREDNISolone (SOLU-MEDROL) injection  40 mg Intravenous Q8H  . mometasone-formoterol  2 puff Inhalation BID  . multivitamin with minerals  1 tablet Oral Daily   Continuous Infusions: . albuterol Stopped (10/25/20 1703)  . levofloxacin (LEVAQUIN) IV Stopped (10/27/20 0221)   PRN Meds:.albuterol, benzonatate, chlorpheniramine-HYDROcodone, diphenhydrAMINE  Antibiotics  :    Anti-infectives (From admission, onward)  Start     Dose/Rate Route Frequency Ordered Stop   10/25/20 1930  levofloxacin (LEVAQUIN) IVPB 750 mg        750 mg 100 mL/hr over 90 Minutes Intravenous Every 24 hours 10/25/20 1916 10/30/20 1929      Royalty Fakhouri M.D on 10/27/2020 at 1:31 PM  To page go to [email or web address]  [facility] -  Office  [phone]     Objective:   Vitals:   10/26/20 1605 10/26/20 1700 10/26/20 1958 10/27/20 0650  BP:   (!) 104/53 120/77  Pulse:  91 (!) 105 81  Resp:   20 20  Temp:   98.4 F (36.9 C) 98.4 F (36.9 C)  TempSrc:   Oral Oral  SpO2:   100% 99%  Weight: 88.2 kg     Height: 5\' 2"  (1.575 m)       Wt Readings from Last 3 Encounters:  10/26/20 88.2 kg  08/05/20 83.9 kg  02/02/20 84 kg     Intake/Output Summary (Last 24 hours) at 10/27/2020 1331 Last data filed at 10/27/2020 1115 Gross per 24 hour  Intake 742.31 ml  Output --  Net 742.31 ml     Physical Exam  Awake Alert, Oriented X 3, No new F.N deficits, Normal affect Diminished air entry, with diffuse  wheezing, mild increased work of breathing. RRR,No Gallops,Rubs or new Murmurs, No Parasternal Heave +ve B.Sounds, Abd Soft, No tenderness, No rebound - guarding or rigidity. No Cyanosis, Clubbing or edema, No new Rash or bruise      Data Review:    CBC Recent Labs  Lab 10/25/20 1413 10/25/20 1417 10/26/20 0108 10/27/20 0128  WBC 11.8*  --  13.4* 25.8*  HGB 12.3 12.9 10.5* 11.1*  HCT 39.7 38.0 33.4* 35.8*  PLT 311  --  274 317  MCV 82.9  --  81.9 82.7  MCH 25.7*  --  25.7* 25.6*  MCHC 31.0  --  31.4 31.0  RDW 14.8  --  14.6 15.0    Recent Labs  Lab 10/25/20 1413 10/25/20 1417 10/26/20 0108 10/26/20 1216 10/27/20 0128  NA 137 139 133*  --  133*  K 4.0 3.7 4.1  --  5.5*  CL 106  --  109  --  103  CO2 21*  --  18*  --  22  GLUCOSE 122*  --  336*  --  309*  BUN 11  --  12  --  12  CREATININE 0.85  --  0.91  --  0.85  CALCIUM 9.4  --  9.0  --  9.5  AST  --   --  19  --   --   ALT  --   --  19  --   --   ALKPHOS  --   --  60  --   --   BILITOT  --   --  0.6  --   --   ALBUMIN  --   --  3.3*  --   --   DDIMER  --   --  <0.27  --   --   HGBA1C  --   --   --  6.0*  --     ------------------------------------------------------------------------------------------------------------------ No results for input(s): CHOL, HDL, LDLCALC, TRIG, CHOLHDL, LDLDIRECT in the last 72 hours.  Lab Results  Component Value Date   HGBA1C 6.0 (H) 10/26/2020   ------------------------------------------------------------------------------------------------------------------ No results for input(s): TSH, T4TOTAL, T3FREE, THYROIDAB in the last 72 hours.  Invalid  input(s): FREET3  Cardiac Enzymes No results for input(s): CKMB, TROPONINI, MYOGLOBIN in the last 168 hours.  Invalid input(s): CK ------------------------------------------------------------------------------------------------------------------    Component Value Date/Time   BNP 8.4 11/04/2015 1005    Micro  Results Recent Results (from the past 240 hour(s))  Resp Panel by RT-PCR (Flu A&B, Covid) Nasopharyngeal Swab     Status: None   Collection Time: 10/25/20  4:43 PM   Specimen: Nasopharyngeal Swab; Nasopharyngeal(NP) swabs in vial transport medium  Result Value Ref Range Status   SARS Coronavirus 2 by RT PCR NEGATIVE NEGATIVE Final    Comment: (NOTE) SARS-CoV-2 target nucleic acids are NOT DETECTED.  The SARS-CoV-2 RNA is generally detectable in upper respiratory specimens during the acute phase of infection. The lowest concentration of SARS-CoV-2 viral copies this assay can detect is 138 copies/mL. A negative result does not preclude SARS-Cov-2 infection and should not be used as the sole basis for treatment or other patient management decisions. A negative result may occur with  improper specimen collection/handling, submission of specimen other than nasopharyngeal swab, presence of viral mutation(s) within the areas targeted by this assay, and inadequate number of viral copies(<138 copies/mL). A negative result must be combined with clinical observations, patient history, and epidemiological information. The expected result is Negative.  Fact Sheet for Patients:  BloggerCourse.com  Fact Sheet for Healthcare Providers:  SeriousBroker.it  This test is no t yet approved or cleared by the [place] FDA and  has been authorized for detection and/or diagnosis of SARS-CoV-2 by FDA under an Emergency Use Authorization (EUA). This EUA will remain  in effect (meaning this test can be used) for the duration of the COVID-19 declaration under Section 564(b)(1) of the Act, 21 U.S.C.section 360bbb-3(b)(1), unless the authorization is terminated  or revoked sooner.       Influenza A by PCR NEGATIVE NEGATIVE Final   Influenza B by PCR NEGATIVE NEGATIVE Final    Comment: (NOTE) The Xpert Xpress SARS-CoV-2/FLU/RSV plus assay is intended as  an aid in the diagnosis of influenza from Nasopharyngeal swab specimens and should not be used as a sole basis for treatment. Nasal washings and aspirates are unacceptable for Xpert Xpress SARS-CoV-2/FLU/RSV testing.  Fact Sheet for Patients: BloggerCourse.com  Fact Sheet for Healthcare Providers: SeriousBroker.it  This test is not yet approved or cleared by the [place] FDA and has been authorized for detection and/or diagnosis of SARS-CoV-2 by FDA under an Emergency Use Authorization (EUA). This EUA will remain in effect (meaning this test can be used) for the duration of the COVID-19 declaration under Section 564(b)(1) of the Act, 21 U.S.C. section 360bbb-3(b)(1), unless the authorization is terminated or revoked.  Performed at [facility], [address] [place], [place] [address]     Radiology Reports DG Chest [place] 1 View  Result Date: 10/25/2020 CLINICAL DATA:  Shortness of breath EXAM: PORTABLE CHEST 1 VIEW COMPARISON:  August 05, 2020 FINDINGS: The lungs are clear. The heart size and pulmonary vascularity are normal. No adenopathy. No bone lesions. IMPRESSION: Lungs clear.  Cardiac silhouette normal. Electronically Signed   By: Bretta Bang III M.D.   On: 10/25/2020 14:30

## 2020-10-27 NOTE — Progress Notes (Signed)
Initial Nutrition Assessment  DOCUMENTATION CODES:  Non-severe (moderate) malnutrition in context of social or environmental circumstances  INTERVENTION:  Add Ensure Enlive po TID, each supplement provides 350 kcal and 20 grams of protein.  Add MVI with minerals daily.  NUTRITION DIAGNOSIS:  Moderate Malnutrition related to social / environmental circumstances (siginificant personal stress) as evidenced by mild fat depletion,mild muscle depletion,moderate muscle depletion.  GOAL:  Patient will meet greater than or equal to 90% of their needs  MONITOR:  PO intake,Supplement acceptance,Labs  REASON FOR ASSESSMENT:  Consult Assessment of nutrition requirement/status  ASSESSMENT:  45 yo female with a PMH of asthma, COPD, polysubstance abuse including tobacco and cocaine, vocal cord dysfunction, HTN, GERD, and anxiety disorder who presents with asthma exacerbation. High blood glucose levels likely related to steroids. Urine drug screen pending.  Spoke with pt at bedside. Pt in good spirits. Reports that she has had significant amount of stress in the past 2 months, so much so that she does not eat too much during the day, but she will sleep-walk and eats at night. She microwaves fajita bowls, [employer], and other frozen meals at night, completely unaware of what she is doing. Her children report what she ate overnight to her in the morning. She also reports eating well in the hospital.  Pt endorses a 20 lb weight loss in the past two months. Per Epic, there is a 10 lb weight gain since 07/2020.  Exam shows some fat and muscle depletion, more so in the legs, which is likely from decreased mobility, which she endorses.  Encouraged PO intake and Ensure TID, and pt is open to it. She needs Vanilla or Strawberry flavors, as she has a chocolate allergy. Also recommend MVI with minerals.  Relevant Medications: SSI, Novolog at bedtime, methylprednisolone, oxycodone, levofloxacin Labs:  reviewed; Na 133, K 5.5, Glucose 213 HbA1c: 6.0% (10/2020)  NUTRITION - FOCUSED PHYSICAL EXAM: Flowsheet Row Most Recent Value  Orbital Region Mild depletion  Upper Arm Region Mild depletion  Thoracic and Lumbar Region No depletion  Buccal Region Moderate depletion  Temple Region Mild depletion  Clavicle Bone Region No depletion  Clavicle and Acromion Bone Region No depletion  Scapular Bone Region No depletion  Dorsal Hand No depletion  Patellar Region Moderate depletion  Anterior Thigh Region Moderate depletion  Posterior Calf Region Mild depletion  Edema (RD Assessment) Mild  Hair Reviewed  Eyes Reviewed  Mouth Reviewed  Skin Reviewed  Nails Reviewed  [pale nail beds]     Diet Order:   Diet Order            Diet regular Room service appropriate? Yes; Fluid consistency: Thin  Diet effective now                EDUCATION NEEDS:  Education needs have been addressed  Skin:  Skin Assessment: Reviewed RN Assessment  Last BM:  10/25/20  Height:  Ht Readings from Last 1 Encounters:  10/26/20 5\' 2"  (1.575 m)   Weight:  Wt Readings from Last 1 Encounters:  10/26/20 88.2 kg   Ideal Body Weight:  50 kg  BMI:  Body mass index is 35.56 kg/m.  Estimated Nutritional Needs:  Kcal:  1800-2000 Protein:  85-100 grams Fluid:  >2 L  10/28/20, RD, LDN Registered Dietitian After Hours/Weekend Pager # in Amion

## 2020-10-28 DIAGNOSIS — E440 Moderate protein-calorie malnutrition: Secondary | ICD-10-CM

## 2020-10-28 LAB — RENAL FUNCTION PANEL
Albumin: 3.5 g/dL (ref 3.5–5.0)
Anion gap: 9 (ref 5–15)
BUN: 15 mg/dL (ref 6–20)
CO2: 28 mmol/L (ref 22–32)
Calcium: 9.8 mg/dL (ref 8.9–10.3)
Chloride: 98 mmol/L (ref 98–111)
Creatinine, Ser: 0.78 mg/dL (ref 0.44–1.00)
GFR, Estimated: >60 mL/min (ref >60)
Glucose, Bld: 239 mg/dL — ABNORMAL HIGH (ref 70–99)
Phosphorus: 3.5 mg/dL (ref 2.5–4.6)
Potassium: 4.7 mmol/L (ref 3.5–5.1)
Sodium: 135 mmol/L (ref 135–145)

## 2020-10-28 LAB — CBC
HCT: 37.5 % (ref 36.0–46.0)
Hemoglobin: 12.1 g/dL (ref 12.0–15.0)
MCH: 26.2 pg (ref 26.0–34.0)
MCHC: 32.3 g/dL (ref 30.0–36.0)
MCV: 81.2 fL (ref 80.0–100.0)
Platelets: 362 10*3/uL (ref 150–400)
RBC: 4.62 MIL/uL (ref 3.87–5.11)
RDW: 14.9 % (ref 11.5–15.5)
WBC: 19.2 10*3/uL — ABNORMAL HIGH (ref 4.0–10.5)
nRBC: 0 % (ref 0.0–0.2)

## 2020-10-28 LAB — BASIC METABOLIC PANEL
Anion gap: 9 (ref 5–15)
BUN: 19 mg/dL (ref 6–20)
CO2: 22 mmol/L (ref 22–32)
Calcium: 9.9 mg/dL (ref 8.9–10.3)
Chloride: 101 mmol/L (ref 98–111)
Creatinine, Ser: 0.82 mg/dL (ref 0.44–1.00)
GFR, Estimated: >60 mL/min (ref >60)
Glucose, Bld: 341 mg/dL — ABNORMAL HIGH (ref 70–99)
Potassium: 6.6 mmol/L (ref 3.5–5.1)
Sodium: 132 mmol/L — ABNORMAL LOW (ref 135–145)

## 2020-10-28 LAB — GLUCOSE, CAPILLARY
Glucose-Capillary: 301 mg/dL — ABNORMAL HIGH (ref 70–99)
Glucose-Capillary: 108 mg/dL — ABNORMAL HIGH (ref 70–99)
Glucose-Capillary: 134 mg/dL — ABNORMAL HIGH (ref 70–99)
Glucose-Capillary: 230 mg/dL — ABNORMAL HIGH (ref 70–99)
Glucose-Capillary: 398 mg/dL — ABNORMAL HIGH (ref 70–99)

## 2020-10-28 MED ORDER — INSULIN ASPART 100 UNIT/ML ~~LOC~~ SOLN
10.0000 [IU] | Freq: Once | SUBCUTANEOUS | Status: AC
  Administered 2020-10-28: 10 [IU] via SUBCUTANEOUS

## 2020-10-28 MED ORDER — SODIUM BICARBONATE 8.4 % IV SOLN
50.0000 meq | Freq: Once | INTRAVENOUS | Status: AC
  Administered 2020-10-28: 50 meq via INTRAVENOUS
  Filled 2020-10-28: qty 50

## 2020-10-28 MED ORDER — BENZONATATE 100 MG PO CAPS
200.0000 mg | ORAL_CAPSULE | Freq: Three times a day (TID) | ORAL | Status: DC
  Administered 2020-10-28 – 2020-10-29 (×4): 200 mg via ORAL
  Filled 2020-10-28 (×4): qty 2

## 2020-10-28 MED ORDER — METHYLPREDNISOLONE SODIUM SUCC 40 MG IJ SOLR
40.0000 mg | Freq: Two times a day (BID) | INTRAMUSCULAR | Status: DC
  Administered 2020-10-28 – 2020-10-29 (×2): 40 mg via INTRAVENOUS
  Filled 2020-10-28 (×2): qty 1

## 2020-10-28 MED ORDER — SODIUM ZIRCONIUM CYCLOSILICATE 10 G PO PACK
10.0000 g | PACK | Freq: Once | ORAL | Status: AC
  Administered 2020-10-28: 10 g via ORAL
  Filled 2020-10-28: qty 1

## 2020-10-28 MED ORDER — CALCIUM GLUCONATE-NACL 1-0.675 GM/50ML-% IV SOLN
1.0000 g | Freq: Once | INTRAVENOUS | Status: AC
  Administered 2020-10-28: 1000 mg via INTRAVENOUS
  Filled 2020-10-28: qty 50

## 2020-10-28 MED ORDER — DEXTROSE 50 % IV SOLN
1.0000 | Freq: Once | INTRAVENOUS | Status: AC
  Administered 2020-10-28: 50 mL via INTRAVENOUS
  Filled 2020-10-28: qty 50

## 2020-10-28 MED ORDER — NITROGLYCERIN 0.4 MG SL SUBL
SUBLINGUAL_TABLET | SUBLINGUAL | Status: AC
  Filled 2020-10-28: qty 1
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
## 2020-10-28 NOTE — Progress Notes (Signed)
Inpatient Diabetes Program Recommendations  AACE/ADA: New Consensus Statement on Inpatient Glycemic Control (2015)  Target Ranges:  Prepandial:   less than 140 mg/dL      Peak postprandial:   less than 180 mg/dL (1-2 hours)      Critically ill patients:  140 - 180 mg/dL   Lab Results  Component Value Date   GLUCAP 108 (H) 10/28/2020   HGBA1C 6.0 (H) 10/26/2020    Review of Glycemic Control  Diabetes history: No prior hx DM Current orders for Inpatient glycemic control: Novolog correction 0-15 tid + hs 0-5 units  Inpatient Diabetes Program Recommendations:   Noted received total of 21 units of Novolog this am due to the one time dose of 10 units for elevated K+. Will follow CBGs.  Thank you, Billy Fischer. Waldemar Siegel, RN, MSN, CDE  Diabetes Coordinator Inpatient Glycemic Control Team Team Pager [phone] (8am-5pm) 10/28/2020 12:21 PM

## 2020-10-28 NOTE — TOC Progression Note (Addendum)
Transition of Care [facility]) - Progression Note    Patient Details  Name: Julie Kerr MRN: [id number] Date of Birth: 02/15/1976  [facility] CM/SW Contact  Lawerance Sabal, RN Phone Number: 10/28/2020, 3:46 PM  Clinical Narrative:     Patient will need transportation home tomorrow. [facility] transportation consent is signed and on chart. Patient provided with shoes. DME rollator and nebulizer ordered to room for discharge.  Attempt unsuccessful to set up with home health services through: [facility]- declined [facility]- declined [facility]-declined [facility]- declined [facility]- declined [facility]- declined [facility]- Accepted    Expected Discharge Plan: Home w Home Health Services Barriers to Discharge: Continued Medical Work up  Expected Discharge Plan and Services Expected Discharge Plan: Home w Home Health Services   Discharge Planning Services: CM Consult Post Acute Care Choice: Home Health Living arrangements for the past 2 months: Single Family Home                 DME Arranged: Walker rolling with seat,Nebulizer machine DME Agency: [employer] Date DME Agency Contacted: 10/28/20 Time DME Agency Contacted: [phone] Representative spoke with at DME Agency: shiela             Social Determinants of Health (SDOH) Interventions    Readmission Risk Interventions No flowsheet data found.

## 2020-10-28 NOTE — Progress Notes (Signed)
HOSPITAL MEDICINE OVERNIGHT EVENT NOTE    Notified by nursing that patient's potassium that has resulted at approximately 1 AM is 6.6.  EKG obtained revealing diffuse ST segment elevation with early repolarization pattern.  PR interval is 156 with narrow QRS complexes complexes measuring 86 ms.  Patient is being provided Lokelma, calcium gluconate, sodium bicarbonate, 10 units of insulin and 1 amp of D50.  We will obtain repeat potassium at 7 AM.  To new to monitor patient on telemetry.  Marinda Elk  MD [facility]

## 2020-10-28 NOTE — Progress Notes (Signed)
PROGRESS NOTE        PATIENT DETAILS Name: Julie Kerr Age: 45 y.o. Sex: female Date of Birth: 10-05-1975 Admit Date: 10/25/2020 Admitting Physician Rometta Emery, MD PCP:Pcp, No  Brief Narrative: Patient is a 45 y.o. female asthma exacerbation, chronic pain syndrome, tobacco use, anxiety disorder-presented with shortness of breath-she was found to have acute asthma exacerbation.  Significant events: 3/13>> admit for asthma exacerbation  Significant studies: 3/13>> chest x-ray: No pneumonia  Antimicrobial therapy: Levaquin: 3/13>>  Microbiology data: None  Procedures : None  Consults: None  DVT Prophylaxis : enoxaparin (LOVENOX) injection 40 mg Start: 10/25/20 2315   Subjective: Feels better-had bilateral chest pain last night.   Assessment/Plan: Asthma exacerbation: Improved-titrate off oxygen today-decrease steroids-continue bronchodilators.    Hyperkalemia:?  Etiology-given insulin/D50/Lokelma last night-potassium has normalized.    GERD: Continue PPI  Chronic pain syndrome: Claims that she takes Percocet at home on a chronic basis-no longer on Neurontin.  Steroid-induced hyperglycemia: A1c 6.0-hyperglycemia should improve as steroid gets tapered down.  Tobacco abuse: Counseled  Nutrition Problem: Nutrition Problem: Moderate Malnutrition Etiology: social / environmental circumstances (siginificant personal stress) Signs/Symptoms: mild fat depletion,mild muscle depletion,moderate muscle depletion Interventions: Ensure Enlive (each supplement provides 350kcal and 20 grams of protein),MVI  Obesity: Estimated body mass index is 35.56 kg/m as calculated from the following:   Height as of this encounter: 5\' 2"  (1.575 m).   Weight as of this encounter: 88.2 kg.    Diet: Diet Order            Diet regular Room service appropriate? Yes; Fluid consistency: Thin  Diet effective now                  Code Status: Full  code   Family Communication: Will update family over the next few days.   Disposition Plan: Status is: Inpatient  Remains inpatient appropriate because:Inpatient level of care appropriate due to severity of illness   Dispo: The patient is from: Home              Anticipated d/c is to: Home              Patient currently is not medically stable to d/c.   Difficult to place patient No   Barriers to Discharge: Asthma exacerbation-hyperkalemia-requiring inpatient treatment  Antimicrobial agents: Anti-infectives (From admission, onward)   Start     Dose/Rate Route Frequency Ordered Stop   10/25/20 1930  levofloxacin (LEVAQUIN) IVPB 750 mg        750 mg 100 mL/hr over 90 Minutes Intravenous Every 24 hours 10/25/20 1916 10/30/20 1929       Time spent: 25 minutes-Greater than 50% of this time was spent in counseling, explanation of diagnosis, planning of further management, and coordination of care.  MEDICATIONS: Scheduled Meds: . enoxaparin (LOVENOX) injection  40 mg Subcutaneous Q24H  . feeding supplement  237 mL Oral TID BM  . insulin aspart  0-15 Units Subcutaneous TID WC  . insulin aspart  0-5 Units Subcutaneous QHS  . ipratropium-albuterol  3 mL Nebulization BID  . methylPREDNISolone (SOLU-MEDROL) injection  40 mg Intravenous Q8H  . mometasone-formoterol  2 puff Inhalation BID  . multivitamin with minerals  1 tablet Oral Daily  . nitroGLYCERIN       Continuous Infusions: . albuterol Stopped (10/25/20 1703)  . levofloxacin (LEVAQUIN) IV Stopped (  10/28/20 0514)   PRN Meds:.albuterol, benzonatate, chlorpheniramine-HYDROcodone, diphenhydrAMINE, oxyCODONE-acetaminophen   PHYSICAL EXAM: Vital signs: Vitals:   10/27/20 1607 10/27/20 2100 10/28/20 0400 10/28/20 0821  BP: 119/83 126/74 131/76   Pulse: 97 98 85 93  Resp: 20 20 13    Temp: 98.8 F (37.1 C) 98.4 F (36.9 C) 97.8 F (36.6 C)   TempSrc: Oral Oral Oral   SpO2: 99% 98% 98%   Weight:      Height:        Filed Weights   10/26/20 1605  Weight: 88.2 kg   Body mass index is 35.56 kg/m.   Gen Exam:Alert awake-not in any distress HEENT:atraumatic, normocephalic Chest: B/L clear to auscultation anteriorly CVS:S1S2 regular Abdomen:soft non tender, non distended Extremities:no edema Neurology: Non focal Skin: no rash  I have personally reviewed following labs and imaging studies  LABORATORY DATA: CBC: Recent Labs  Lab 10/25/20 1413 10/25/20 1417 10/26/20 0108 10/27/20 0128 10/28/20 0049  WBC 11.8*  --  13.4* 25.8* 19.2*  HGB 12.3 12.9 10.5* 11.1* 12.1  HCT 39.7 38.0 33.4* 35.8* 37.5  MCV 82.9  --  81.9 82.7 81.2  PLT 311  --  274 317 362    Basic Metabolic Panel: Recent Labs  Lab 10/25/20 1413 10/25/20 1417 10/26/20 0108 10/27/20 0128 10/28/20 0049 10/28/20 0705  NA 137 139 133* 133* 132* 135  K 4.0 3.7 4.1 5.5* 6.6* 4.7  CL 106  --  109 103 101 98  CO2 21*  --  18* 22 22 28   GLUCOSE 122*  --  336* 309* 341* 239*  BUN 11  --  12 12 19 15   CREATININE 0.85  --  0.91 0.85 0.82 0.78  CALCIUM 9.4  --  9.0 9.5 9.9 9.8  PHOS  --   --   --   --   --  3.5    GFR: Estimated Creatinine Clearance: 94.4 mL/min (by C-G formula based on SCr of 0.78 mg/dL).  Liver Function Tests: Recent Labs  Lab 10/26/20 0108 10/28/20 0705  AST 19  --   ALT 19  --   ALKPHOS 60  --   BILITOT 0.6  --   PROT 6.8  --   ALBUMIN 3.3* 3.5   No results for input(s): LIPASE, AMYLASE in the last 168 hours. No results for input(s): AMMONIA in the last 168 hours.  Coagulation Profile: No results for input(s): INR, PROTIME in the last 168 hours.  Cardiac Enzymes: No results for input(s): CKTOTAL, CKMB, CKMBINDEX, TROPONINI in the last 168 hours.  BNP (last 3 results) No results for input(s): PROBNP in the last 8760 hours.  Lipid Profile: No results for input(s): CHOL, HDL, LDLCALC, TRIG, CHOLHDL, LDLDIRECT in the last 72 hours.  Thyroid Function Tests: No results for input(s):  TSH, T4TOTAL, FREET4, T3FREE, THYROIDAB in the last 72 hours.  Anemia Panel: No results for input(s): VITAMINB12, FOLATE, FERRITIN, TIBC, IRON, RETICCTPCT in the last 72 hours.  Urine analysis:    Component Value Date/Time   COLORURINE YELLOW 08/28/2015 1714   APPEARANCEUR CLEAR 08/28/2015 1714   LABSPEC 1.019 08/28/2015 1714   PHURINE 6.5 08/28/2015 1714   GLUCOSEU >1000 (A) 08/28/2015 1714   HGBUR NEGATIVE 08/28/2015 1714   BILIRUBINUR NEGATIVE 08/28/2015 1714   KETONESUR 15 (A) 08/28/2015 1714   PROTEINUR NEGATIVE 08/28/2015 1714   UROBILINOGEN 0.2 03/25/2015 1526   NITRITE NEGATIVE 08/28/2015 1714   LEUKOCYTESUR NEGATIVE 08/28/2015 1714    Sepsis Labs: Lactic Acid, Venous  Component Value Date/Time   LATICACIDVEN 1.74 07/27/2015 0925    MICROBIOLOGY: Recent Results (from the past 240 hour(s))  Resp Panel by RT-PCR (Flu A&B, Covid) Nasopharyngeal Swab     Status: None   Collection Time: 10/25/20  4:43 PM   Specimen: Nasopharyngeal Swab; Nasopharyngeal(NP) swabs in vial transport medium  Result Value Ref Range Status   SARS Coronavirus 2 by RT PCR NEGATIVE NEGATIVE Final    Comment: (NOTE) SARS-CoV-2 target nucleic acids are NOT DETECTED.  The SARS-CoV-2 RNA is generally detectable in upper respiratory specimens during the acute phase of infection. The lowest concentration of SARS-CoV-2 viral copies this assay can detect is 138 copies/mL. A negative result does not preclude SARS-Cov-2 infection and should not be used as the sole basis for treatment or other patient management decisions. A negative result may occur with  improper specimen collection/handling, submission of specimen other than nasopharyngeal swab, presence of viral mutation(s) within the areas targeted by this assay, and inadequate number of viral copies(<138 copies/mL). A negative result must be combined with clinical observations, patient history, and epidemiological information. The expected  result is Negative.  Fact Sheet for Patients:  BloggerCourse.com  Fact Sheet for Healthcare Providers:  SeriousBroker.it  This test is no t yet approved or cleared by the [place] FDA and  has been authorized for detection and/or diagnosis of SARS-CoV-2 by FDA under an Emergency Use Authorization (EUA). This EUA will remain  in effect (meaning this test can be used) for the duration of the COVID-19 declaration under Section 564(b)(1) of the Act, 21 U.S.C.section 360bbb-3(b)(1), unless the authorization is terminated  or revoked sooner.       Influenza A by PCR NEGATIVE NEGATIVE Final   Influenza B by PCR NEGATIVE NEGATIVE Final    Comment: (NOTE) The Xpert Xpress SARS-CoV-2/FLU/RSV plus assay is intended as an aid in the diagnosis of influenza from Nasopharyngeal swab specimens and should not be used as a sole basis for treatment. Nasal washings and aspirates are unacceptable for Xpert Xpress SARS-CoV-2/FLU/RSV testing.  Fact Sheet for Patients: BloggerCourse.com  Fact Sheet for Healthcare Providers: SeriousBroker.it  This test is not yet approved or cleared by the [place] FDA and has been authorized for detection and/or diagnosis of SARS-CoV-2 by FDA under an Emergency Use Authorization (EUA). This EUA will remain in effect (meaning this test can be used) for the duration of the COVID-19 declaration under Section 564(b)(1) of the Act, 21 U.S.C. section 360bbb-3(b)(1), unless the authorization is terminated or revoked.  Performed at [facility], [address] [place], [place] [address]     RADIOLOGY STUDIES/RESULTS: No results found.   LOS: 2 days   Jeoffrey Massed, MD  [facility]    To contact the attending provider between 7A-7P or the covering provider during after hours 7P-7A, please log into the web site [email or web address] and access  using universal  password for that web site. If you do not have the password, please call the hospital operator.  10/28/2020, 9:13 AM

## 2020-10-29 ENCOUNTER — Other Ambulatory Visit: Payer: Self-pay | Admitting: Internal Medicine

## 2020-10-29 LAB — BASIC METABOLIC PANEL
Anion gap: 10 (ref 5–15)
BUN: 21 mg/dL — ABNORMAL HIGH (ref 6–20)
CO2: 26 mmol/L (ref 22–32)
Calcium: 9.6 mg/dL (ref 8.9–10.3)
Chloride: 97 mmol/L — ABNORMAL LOW (ref 98–111)
Creatinine, Ser: 0.96 mg/dL (ref 0.44–1.00)
GFR, Estimated: >60 mL/min (ref >60)
Glucose, Bld: 265 mg/dL — ABNORMAL HIGH (ref 70–99)
Potassium: 4.7 mmol/L (ref 3.5–5.1)
Sodium: 133 mmol/L — ABNORMAL LOW (ref 135–145)

## 2020-10-29 LAB — GLUCOSE, CAPILLARY: Glucose-Capillary: 222 mg/dL — ABNORMAL HIGH (ref 70–99)

## 2020-10-29 MED ORDER — ALBUTEROL SULFATE (2.5 MG/3ML) 0.083% IN NEBU: 2.5000 mg | INHALATION_SOLUTION | Freq: Four times a day (QID) | RESPIRATORY_TRACT | 0 refills | Status: AC | PRN

## 2020-10-29 MED ORDER — ALBUTEROL SULFATE HFA 108 (90 BASE) MCG/ACT IN AERS: 1.0000 | INHALATION_SPRAY | Freq: Four times a day (QID) | RESPIRATORY_TRACT | 0 refills | Status: AC | PRN

## 2020-10-29 MED ORDER — PREDNISONE 10 MG PO TABS: ORAL_TABLET | ORAL | 0 refills | Status: AC

## 2020-10-29 MED ORDER — FLUTICASONE FUROATE-VILANTEROL 200-25 MCG/INH IN AEPB: 1.0000 | INHALATION_SPRAY | Freq: Every day | RESPIRATORY_TRACT | 0 refills | Status: AC

## 2020-10-29 MED ORDER — PANTOPRAZOLE SODIUM 40 MG PO TBEC: 40.0000 mg | DELAYED_RELEASE_TABLET | Freq: Every day | ORAL | 0 refills | Status: AC

## 2020-10-29 NOTE — Progress Notes (Signed)
The patient was discharged from 5W05 via wheelchair. [facility] [employer] services were called to arrange transportation to the address she provided myself. IV d/c'd, skin intact. Patient is on RA. AVS reviewed and questions answered. Patient was sent with home meds, nebulizer machine, and rolling walker. Patient was taken to entrance by nurse tech.

## 2020-10-29 NOTE — Discharge Summary (Signed)
PATIENT DETAILS Name: Julie Kerr Age: 45 y.o. Sex: female Date of Birth: May 26, 1976 MRN: [id number]. Admitting Physician: Rometta Emery, MD PCP:Pcp, No  Admit Date: 10/25/2020 Discharge date: 10/29/2020  Recommendations for Outpatient Follow-up:  1. Follow up with PCP in 1-2 weeks 2. Please obtain CMP/CBC in one week   Admitted From:  Home  Disposition: Home with home health services   Home Health:  Yes  Equipment/Devices: None  Discharge Condition: Stable  CODE STATUS: FULL CODE  Diet recommendation:  Diet Order            Diet - low sodium heart healthy           Diet regular Room service appropriate? Yes; Fluid consistency: Thin  Diet effective now                  Brief Narrative: Patient is a 45 y.o. female asthma exacerbation, chronic pain syndrome, tobacco use, anxiety disorder-presented with shortness of breath-she was found to have acute asthma exacerbation.  Significant events: 3/13>> admit for asthma exacerbation  Significant studies: 3/13>> chest x-ray: No pneumonia  Antimicrobial therapy: Levaquin: 3/13>>3/17  Microbiology data: None  Procedures : None  Consults: None  Brief Hospital Course: Asthma exacerbation: Much improved-feels that she is back to baseline-no longer on oxygen-treated with steroids/bronchodilators-continue tapering steroids-an inhaler regimen on discharge.     Hyperkalemia:?  Etiology-given insulin/D50/Lokelma-potassium levels have normalized.  Continue outpatient monitoring.  GERD: Continue PPI  Chronic pain syndrome: Claims that she takes Percocet at home on a chronic basis-no longer on Neurontin.  Steroid-induced hyperglycemia: A1c 6.0-hyperglycemia should improve as steroid gets tapered down.  Tobacco abuse: Counseled  Nutrition Problem: Nutrition Problem: Moderate Malnutrition Etiology: social / environmental circumstances (siginificant personal stress) Signs/Symptoms: mild fat  depletion,mild muscle depletion,moderate muscle depletion Interventions: Ensure Enlive (each supplement provides 350kcal and 20 grams of protein),MVI  Obesity: Estimated body mass index is 35.56 kg/m as calculated from the following:   Height as of this encounter: 5\' 2"  (1.575 m).   Weight as of this encounter: 88.2 kg.     Discharge Diagnoses:  Principal Problem:   Asthma exacerbation Active Problems:   Tobacco use disorder   Adjustment disorder with anxious mood   GERD (gastroesophageal reflux disease)   Chronic narcotic dependence (HCC)   Asthma attack   Malnutrition of moderate degree   Discharge Instructions:  Activity:  As tolerated with Full fall precautions use walker/cane & assistance as needed  Discharge Instructions    Call MD for:  difficulty breathing, headache or visual disturbances   Complete by: As directed    Diet - low sodium heart healthy   Complete by: As directed    Discharge instructions   Complete by: As directed    Follow with Primary MD in 1-2 weeks  Please get a complete blood count and chemistry panel checked by your Primary MD at your next visit, and again as instructed by your Primary MD.  Get Medicines reviewed and adjusted: Please take all your medications with you for your next visit with your Primary MD  Laboratory/radiological data: Please request your Primary MD to go over all hospital tests and procedure/radiological results at the follow up, please ask your Primary MD to get all Hospital records sent to his/her office.  In some cases, they will be blood work, cultures and biopsy results pending at the time of your discharge. Please request that your primary care M.D. follows up on these results.  Also Note the  following: If you experience worsening of your admission symptoms, develop shortness of breath, life threatening emergency, suicidal or homicidal thoughts you must seek medical attention immediately by calling 911 or calling  your MD immediately  if symptoms less severe.  You must read complete instructions/literature along with all the possible adverse reactions/side effects for all the Medicines you take and that have been prescribed to you. Take any new Medicines after you have completely understood and accpet all the possible adverse reactions/side effects.   Do not drive when taking Pain medications or sleeping medications (Benzodaizepines)  Do not take more than prescribed Pain, Sleep and Anxiety Medications. It is not advisable to combine anxiety,sleep and pain medications without talking with your primary care practitioner  Special Instructions: If you have smoked or chewed Tobacco  in the last 2 yrs please stop smoking, stop any regular Alcohol  and or any Recreational drug use.  Wear Seat belts while driving.  Please note: You were cared for by a hospitalist during your hospital stay. Once you are discharged, your primary care physician will handle any further medical issues. Please note that NO REFILLS for any discharge medications will be authorized once you are discharged, as it is imperative that you return to your primary care physician (or establish a relationship with a primary care physician if you do not have one) for your post hospital discharge needs so that they can reassess your need for medications and monitor your lab values.   Increase activity slowly   Complete by: As directed      Allergies as of 10/29/2020      Reactions   Cellulose Hives   Robitussin Dm [dextromethorphan-guaifenesin] Nausea And Vomiting   Chocolate Hives   Nsaids Hives, Swelling   Suboxone [buprenorphine Hcl-naloxone Hcl] Other (See Comments)   Aggressive behavior   Nsaids Hives   Rayon, Purified Hives   Tolmetin Hives   Tramadol Hives, Itching      Medication List    STOP taking these medications   acetaminophen 325 MG tablet Commonly known as: TYLENOL   ALPRAZolam 0.5 MG tablet Commonly known as:  Xanax   fluticasone 50 MCG/ACT nasal spray Commonly known as: Flonase   gabapentin 300 MG capsule Commonly known as: Neurontin   guaiFENesin 600 MG 12 hr tablet Commonly known as: MUCINEX   ipratropium-albuterol 0.5-2.5 (3) MG/3ML Soln Commonly known as: DUONEB     TAKE these medications   albuterol (2.5 MG/3ML) 0.083% nebulizer solution Commonly known as: PROVENTIL Take 3 mLs (2.5 mg total) by nebulization every 6 (six) hours as needed for wheezing or shortness of breath. What changed: Another medication with the same name was removed. Continue taking this medication, and follow the directions you see here.   albuterol 108 (90 Base) MCG/ACT inhaler Commonly known as: VENTOLIN HFA Inhale 1-2 puffs into the lungs every 6 (six) hours as needed for wheezing or shortness of breath. What changed: Another medication with the same name was removed. Continue taking this medication, and follow the directions you see here.   fluticasone furoate-vilanterol 200-25 MCG/INH Aepb Commonly known as: BREO ELLIPTA Inhale 1 puff into the lungs daily.   pantoprazole 40 MG tablet Commonly known as: Protonix Take 1 tablet (40 mg total) by mouth daily. What changed: when to take this   predniSONE 10 MG tablet Commonly known as: DELTASONE Take 40 mg daily for 1 day, 30 mg daily for 1 day, 20 mg daily for 1 days,10 mg daily for 1 day,  then stop            Durable Medical Equipment  (From admission, onward)         Start     Ordered   10/28/20 1544  For home use only DME 4 wheeled rolling walker with seat  Once       Question:  Patient needs a walker to treat with the following condition  Answer:  Weakness   10/28/20 1543   10/28/20 1211  For home use only DME Nebulizer machine  Once       Question Answer Comment  Patient needs a nebulizer to treat with the following condition Asthma   Length of Need Lifetime      10/28/20 1210          Follow-up Information    [facility] Follow [facility].   Specialty: Home Health Services Why: For home health services  Contact information: [address] [place] [address] [phone]        Primary Care MD. Schedule an appointment as soon as possible for a visit in 1 week(s).              Allergies  Allergen Reactions  . Cellulose Hives  . Robitussin Dm [Dextromethorphan-Guaifenesin] Nausea And Vomiting  . Chocolate Hives  . Nsaids Hives and Swelling  . Suboxone [Buprenorphine Hcl-Naloxone Hcl] Other (See Comments)    Aggressive behavior  . Nsaids Hives  . Rayon, Purified Hives  . Tolmetin Hives  . Tramadol Hives and Itching    Other Procedures/Studies: DG Chest Port 1 View  Result Date: 10/25/2020 CLINICAL DATA:  Shortness of breath EXAM: PORTABLE CHEST 1 VIEW COMPARISON:  August 05, 2020 FINDINGS: The lungs are clear. The heart size and pulmonary vascularity are normal. No adenopathy. No bone lesions. IMPRESSION: Lungs clear.  Cardiac silhouette normal. Electronically Signed   By: Bretta Bang III M.D.   On: 10/25/2020 14:30     TODAY-DAY OF DISCHARGE:  Subjective:   Julie Kerr today has no headache,no chest abdominal pain,no new weakness tingling or numbness, feels much better wants to go home today.   Objective:   Blood pressure 124/89, pulse 85, temperature 97.9 F (36.6 C), temperature source Oral, resp. rate 17, height 5\' 2"  (1.575 m), weight 88.2 kg, SpO2 100 %.  Intake/Output Summary (Last 24 hours) at 10/29/2020 0856 Last data filed at 10/28/2020 2200 Gross per 24 hour  Intake 629.24 ml  Output 900 ml  Net -270.76 ml   Filed Weights   10/26/20 1605  Weight: 88.2 kg    Exam: Awake Alert, Oriented *3, No new F.N deficits, Normal affect [place].AT,PERRAL Supple Neck,No JVD, No cervical lymphadenopathy appriciated.  Symmetrical Chest wall movement, Good air movement bilaterally, CTAB RRR,No Gallops,Rubs or new Murmurs, No Parasternal Heave +ve B.Sounds, Abd  Soft, Non tender, No organomegaly appriciated, No rebound -guarding or rigidity. No Cyanosis, Clubbing or edema, No new Rash or bruise   PERTINENT RADIOLOGIC STUDIES: No results found.   PERTINENT LAB RESULTS: CBC: Recent Labs    10/27/20 0128 10/28/20 0049  WBC 25.8* 19.2*  HGB 11.1* 12.1  HCT 35.8* 37.5  PLT 317 362   CMET CMP     Component Value Date/Time   NA 133 (L) 10/29/2020 0053   K 4.7 10/29/2020 0053   CL 97 (L) 10/29/2020 0053   CO2 26 10/29/2020 0053   GLUCOSE 265 (H) 10/29/2020 0053   BUN 21 (H) 10/29/2020 0053  CREATININE 0.96 10/29/2020 0053   CALCIUM 9.6 10/29/2020 0053   PROT 6.8 10/26/2020 0108   ALBUMIN 3.5 10/28/2020 0705   AST 19 10/26/2020 0108   ALT 19 10/26/2020 0108   ALKPHOS 60 10/26/2020 0108   BILITOT 0.6 10/26/2020 0108   GFRNONAA >60 10/29/2020 0053   GFRAA >60 02/03/2020 0414    GFR Estimated Creatinine Clearance: 78.7 mL/min (by C-G formula based on SCr of 0.96 mg/dL). No results for input(s): LIPASE, AMYLASE in the last 72 hours. No results for input(s): CKTOTAL, CKMB, CKMBINDEX, TROPONINI in the last 72 hours. Invalid input(s): POCBNP No results for input(s): DDIMER in the last 72 hours. Recent Labs    10/26/20 1216  HGBA1C 6.0*   No results for input(s): CHOL, HDL, LDLCALC, TRIG, CHOLHDL, LDLDIRECT in the last 72 hours. No results for input(s): TSH, T4TOTAL, T3FREE, THYROIDAB in the last 72 hours.  Invalid input(s): FREET3 No results for input(s): VITAMINB12, FOLATE, FERRITIN, TIBC, IRON, RETICCTPCT in the last 72 hours. Coags: No results for input(s): INR in the last 72 hours.  Invalid input(s): PT Microbiology: Recent Results (from the past 240 hour(s))  Resp Panel by RT-PCR (Flu A&B, Covid) Nasopharyngeal Swab     Status: None   Collection Time: 10/25/20  4:43 PM   Specimen: Nasopharyngeal Swab; Nasopharyngeal(NP) swabs in vial transport medium  Result Value Ref Range Status   SARS Coronavirus 2 by RT PCR  NEGATIVE NEGATIVE Final    Comment: (NOTE) SARS-CoV-2 target nucleic acids are NOT DETECTED.  The SARS-CoV-2 RNA is generally detectable in upper respiratory specimens during the acute phase of infection. The lowest concentration of SARS-CoV-2 viral copies this assay can detect is 138 copies/mL. A negative result does not preclude SARS-Cov-2 infection and should not be used as the sole basis for treatment or other patient management decisions. A negative result may occur with  improper specimen collection/handling, submission of specimen other than nasopharyngeal swab, presence of viral mutation(s) within the areas targeted by this assay, and inadequate number of viral copies(<138 copies/mL). A negative result must be combined with clinical observations, patient history, and epidemiological information. The expected result is Negative.  Fact Sheet for Patients:  BloggerCourse.com  Fact Sheet for Healthcare Providers:  SeriousBroker.it  This test is no t yet approved or cleared by the [place] FDA and  has been authorized for detection and/or diagnosis of SARS-CoV-2 by FDA under an Emergency Use Authorization (EUA). This EUA will remain  in effect (meaning this test can be used) for the duration of the COVID-19 declaration under Section 564(b)(1) of the Act, 21 U.S.C.section 360bbb-3(b)(1), unless the authorization is terminated  or revoked sooner.       Influenza A by PCR NEGATIVE NEGATIVE Final   Influenza B by PCR NEGATIVE NEGATIVE Final    Comment: (NOTE) The Xpert Xpress SARS-CoV-2/FLU/RSV plus assay is intended as an aid in the diagnosis of influenza from Nasopharyngeal swab specimens and should not be used as a sole basis for treatment. Nasal washings and aspirates are unacceptable for Xpert Xpress SARS-CoV-2/FLU/RSV testing.  Fact Sheet for Patients: BloggerCourse.com  Fact Sheet for  Healthcare Providers: SeriousBroker.it  This test is not yet approved or cleared by the [place] FDA and has been authorized for detection and/or diagnosis of SARS-CoV-2 by FDA under an Emergency Use Authorization (EUA). This EUA will remain in effect (meaning this test can be used) for the duration of the COVID-19 declaration under Section 564(b)(1) of the Act, 21 U.S.C. section 360bbb-3(b)(1),  unless the authorization is terminated or revoked.  Performed at [facility], [address] [place], [place] [id number]     FURTHER DISCHARGE INSTRUCTIONS:  Get Medicines reviewed and adjusted: Please take all your medications with you for your next visit with your Primary MD  Laboratory/radiological data: Please request your Primary MD to go over all hospital tests and procedure/radiological results at the follow up, please ask your Primary MD to get all Hospital records sent to his/her office.  In some cases, they will be blood work, cultures and biopsy results pending at the time of your discharge. Please request that your primary care M.D. goes through all the records of your hospital data and follows up on these results.  Also Note the following: If you experience worsening of your admission symptoms, develop shortness of breath, life threatening emergency, suicidal or homicidal thoughts you must seek medical attention immediately by calling 911 or calling your MD immediately  if symptoms less severe.  You must read complete instructions/literature along with all the possible adverse reactions/side effects for all the Medicines you take and that have been prescribed to you. Take any new Medicines after you have completely understood and accpet all the possible adverse reactions/side effects.   Do not drive when taking Pain medications or sleeping medications (Benzodaizepines)  Do not take more than prescribed Pain, Sleep and Anxiety Medications. It  is not advisable to combine anxiety,sleep and pain medications without talking with your primary care practitioner  Special Instructions: If you have smoked or chewed Tobacco  in the last 2 yrs please stop smoking, stop any regular Alcohol  and or any Recreational drug use.  Wear Seat belts while driving.  Please note: You were cared for by a hospitalist during your hospital stay. Once you are discharged, your primary care physician will handle any further medical issues. Please note that NO REFILLS for any discharge medications will be authorized once you are discharged, as it is imperative that you return to your primary care physician (or establish a relationship with a primary care physician if you do not have one) for your post hospital discharge needs so that they can reassess your need for medications and monitor your lab values.  Total Time spent coordinating discharge including counseling, education and face to face time equals 35 minutes.  SignedJeoffrey Massed: Shanker Ghimire 10/29/2020 8:56 AM

## 2021-02-12 DEATH — deceased
